# Patient Record
Sex: Female | Born: 1945 | Race: Black or African American | Hispanic: No | Smoking: Former smoker
Health system: Southern US, Community
[De-identification: ages and names within clinical notes are randomized; demographics above are authoritative.]

## PROBLEM LIST (undated history)

## (undated) DIAGNOSIS — E039 Hypothyroidism, unspecified: Secondary | ICD-10-CM

## (undated) DIAGNOSIS — F419 Anxiety disorder, unspecified: Secondary | ICD-10-CM

## (undated) DIAGNOSIS — R011 Cardiac murmur, unspecified: Secondary | ICD-10-CM

## (undated) DIAGNOSIS — Z8679 Personal history of other diseases of the circulatory system: Secondary | ICD-10-CM

## (undated) DIAGNOSIS — D649 Anemia, unspecified: Secondary | ICD-10-CM

## (undated) DIAGNOSIS — Z860100 Personal history of colon polyps, unspecified: Secondary | ICD-10-CM

## (undated) DIAGNOSIS — R7303 Prediabetes: Secondary | ICD-10-CM

## (undated) DIAGNOSIS — I253 Aneurysm of heart: Secondary | ICD-10-CM

## (undated) DIAGNOSIS — M1990 Unspecified osteoarthritis, unspecified site: Secondary | ICD-10-CM

## (undated) DIAGNOSIS — J449 Chronic obstructive pulmonary disease, unspecified: Secondary | ICD-10-CM

## (undated) DIAGNOSIS — I2510 Atherosclerotic heart disease of native coronary artery without angina pectoris: Secondary | ICD-10-CM

## (undated) DIAGNOSIS — G4700 Insomnia, unspecified: Secondary | ICD-10-CM

## (undated) DIAGNOSIS — M5030 Other cervical disc degeneration, unspecified cervical region: Secondary | ICD-10-CM

## (undated) DIAGNOSIS — R519 Headache, unspecified: Secondary | ICD-10-CM

## (undated) DIAGNOSIS — E785 Hyperlipidemia, unspecified: Secondary | ICD-10-CM

## (undated) DIAGNOSIS — I10 Essential (primary) hypertension: Secondary | ICD-10-CM

## (undated) DIAGNOSIS — G9050 Complex regional pain syndrome I, unspecified: Secondary | ICD-10-CM

## (undated) DIAGNOSIS — M899 Disorder of bone, unspecified: Secondary | ICD-10-CM

## (undated) DIAGNOSIS — R51 Headache: Secondary | ICD-10-CM

## (undated) DIAGNOSIS — M949 Disorder of cartilage, unspecified: Secondary | ICD-10-CM

## (undated) DIAGNOSIS — K219 Gastro-esophageal reflux disease without esophagitis: Secondary | ICD-10-CM

## (undated) DIAGNOSIS — I739 Peripheral vascular disease, unspecified: Principal | ICD-10-CM

## (undated) HISTORY — DX: Gastro-esophageal reflux disease without esophagitis: K21.9

## (undated) HISTORY — DX: Disorder of bone, unspecified: M89.9

## (undated) HISTORY — DX: Peripheral vascular disease, unspecified: I73.9

## (undated) HISTORY — DX: Cardiac murmur, unspecified: R01.1

## (undated) HISTORY — DX: Anxiety disorder, unspecified: F41.9

## (undated) HISTORY — DX: Other cervical disc degeneration, unspecified cervical region: M50.30

## (undated) HISTORY — DX: Unspecified osteoarthritis, unspecified site: M19.90

## (undated) HISTORY — DX: Hyperlipidemia, unspecified: E78.5

## (undated) HISTORY — DX: Disorder of bone, unspecified: M94.9

## (undated) HISTORY — PX: TONSILLECTOMY: SUR1361

## (undated) HISTORY — DX: Atherosclerotic heart disease of native coronary artery without angina pectoris: I25.10

## (undated) HISTORY — DX: Personal history of colon polyps, unspecified: Z86.0100

## (undated) HISTORY — DX: Hypothyroidism, unspecified: E03.9

## (undated) HISTORY — DX: Personal history of colonic polyps: Z86.010

## (undated) HISTORY — DX: Complex regional pain syndrome I, unspecified: G90.50

## (undated) HISTORY — DX: Essential (primary) hypertension: I10

## (undated) HISTORY — DX: Anemia, unspecified: D64.9

## (undated) HISTORY — DX: Personal history of other diseases of the circulatory system: Z86.79

## (undated) HISTORY — DX: Insomnia, unspecified: G47.00

## (undated) HISTORY — DX: Chronic obstructive pulmonary disease, unspecified: J44.9

## (undated) HISTORY — PX: JOINT REPLACEMENT: SHX530

---

## 1975-08-24 HISTORY — PX: ABDOMINAL HYSTERECTOMY: SHX81

## 1976-08-23 HISTORY — PX: DILATION AND CURETTAGE OF UTERUS: SHX78

## 1993-08-23 HISTORY — PX: THYROIDECTOMY: SHX17

## 1998-01-21 ENCOUNTER — Ambulatory Visit (HOSPITAL_COMMUNITY): Admission: RE | Admit: 1998-01-21 | Discharge: 1998-01-21 | Payer: Self-pay | Admitting: Internal Medicine

## 1998-10-30 ENCOUNTER — Encounter: Payer: Self-pay | Admitting: Emergency Medicine

## 1998-10-30 ENCOUNTER — Emergency Department (HOSPITAL_COMMUNITY): Admission: EM | Admit: 1998-10-30 | Discharge: 1998-10-30 | Payer: Self-pay | Admitting: Emergency Medicine

## 1998-12-25 ENCOUNTER — Encounter: Payer: Self-pay | Admitting: Emergency Medicine

## 1998-12-25 ENCOUNTER — Emergency Department (HOSPITAL_COMMUNITY): Admission: EM | Admit: 1998-12-25 | Discharge: 1998-12-25 | Payer: Self-pay | Admitting: Emergency Medicine

## 1999-02-12 ENCOUNTER — Ambulatory Visit (HOSPITAL_COMMUNITY): Admission: RE | Admit: 1999-02-12 | Discharge: 1999-02-12 | Payer: Self-pay | Admitting: Internal Medicine

## 2000-02-17 ENCOUNTER — Encounter: Payer: Self-pay | Admitting: Internal Medicine

## 2000-02-17 ENCOUNTER — Ambulatory Visit (HOSPITAL_COMMUNITY): Admission: RE | Admit: 2000-02-17 | Discharge: 2000-02-17 | Payer: Self-pay | Admitting: Internal Medicine

## 2000-04-10 ENCOUNTER — Encounter: Payer: Self-pay | Admitting: Emergency Medicine

## 2000-04-10 ENCOUNTER — Emergency Department (HOSPITAL_COMMUNITY): Admission: EM | Admit: 2000-04-10 | Discharge: 2000-04-10 | Payer: Self-pay | Admitting: Emergency Medicine

## 2000-11-10 ENCOUNTER — Encounter: Payer: Self-pay | Admitting: Endocrinology

## 2000-11-10 ENCOUNTER — Ambulatory Visit (HOSPITAL_COMMUNITY): Admission: RE | Admit: 2000-11-10 | Discharge: 2000-11-10 | Payer: Self-pay | Admitting: Endocrinology

## 2000-12-01 ENCOUNTER — Encounter (INDEPENDENT_AMBULATORY_CARE_PROVIDER_SITE_OTHER): Payer: Self-pay | Admitting: Specialist

## 2000-12-02 ENCOUNTER — Inpatient Hospital Stay (HOSPITAL_COMMUNITY): Admission: EM | Admit: 2000-12-02 | Discharge: 2000-12-03 | Payer: Self-pay | Admitting: Surgery

## 2001-01-01 ENCOUNTER — Encounter: Payer: Self-pay | Admitting: Emergency Medicine

## 2001-01-01 ENCOUNTER — Emergency Department (HOSPITAL_COMMUNITY): Admission: EM | Admit: 2001-01-01 | Discharge: 2001-01-01 | Payer: Self-pay | Admitting: Emergency Medicine

## 2002-01-24 ENCOUNTER — Encounter: Payer: Self-pay | Admitting: Family Medicine

## 2002-01-24 ENCOUNTER — Encounter: Admission: RE | Admit: 2002-01-24 | Discharge: 2002-01-24 | Payer: Self-pay | Admitting: Family Medicine

## 2002-07-04 ENCOUNTER — Encounter (INDEPENDENT_AMBULATORY_CARE_PROVIDER_SITE_OTHER): Payer: Self-pay | Admitting: *Deleted

## 2002-07-04 ENCOUNTER — Ambulatory Visit (HOSPITAL_COMMUNITY): Admission: RE | Admit: 2002-07-04 | Discharge: 2002-07-04 | Payer: Self-pay | Admitting: Gastroenterology

## 2003-01-15 ENCOUNTER — Ambulatory Visit (HOSPITAL_COMMUNITY): Admission: RE | Admit: 2003-01-15 | Discharge: 2003-01-15 | Payer: Self-pay | Admitting: Emergency Medicine

## 2003-02-22 ENCOUNTER — Encounter: Payer: Self-pay | Admitting: Cardiology

## 2003-08-24 HISTORY — PX: CARPAL TUNNEL RELEASE: SHX101

## 2004-01-15 ENCOUNTER — Emergency Department (HOSPITAL_COMMUNITY): Admission: EM | Admit: 2004-01-15 | Discharge: 2004-01-15 | Payer: Self-pay | Admitting: Emergency Medicine

## 2004-02-03 ENCOUNTER — Emergency Department (HOSPITAL_COMMUNITY): Admission: EM | Admit: 2004-02-03 | Discharge: 2004-02-03 | Payer: Self-pay | Admitting: Emergency Medicine

## 2004-02-12 ENCOUNTER — Emergency Department (HOSPITAL_COMMUNITY): Admission: EM | Admit: 2004-02-12 | Discharge: 2004-02-12 | Payer: Self-pay | Admitting: Emergency Medicine

## 2004-03-16 ENCOUNTER — Emergency Department (HOSPITAL_COMMUNITY): Admission: EM | Admit: 2004-03-16 | Discharge: 2004-03-16 | Payer: Self-pay | Admitting: Emergency Medicine

## 2004-06-19 ENCOUNTER — Ambulatory Visit (HOSPITAL_COMMUNITY): Admission: RE | Admit: 2004-06-19 | Discharge: 2004-06-19 | Payer: Self-pay | Admitting: Internal Medicine

## 2004-12-29 ENCOUNTER — Ambulatory Visit: Payer: Self-pay | Admitting: Internal Medicine

## 2005-01-19 ENCOUNTER — Ambulatory Visit: Payer: Self-pay

## 2005-06-11 ENCOUNTER — Ambulatory Visit: Payer: Self-pay | Admitting: Internal Medicine

## 2005-09-30 ENCOUNTER — Ambulatory Visit: Payer: Self-pay | Admitting: Internal Medicine

## 2005-10-08 ENCOUNTER — Ambulatory Visit: Payer: Self-pay | Admitting: Internal Medicine

## 2005-12-20 ENCOUNTER — Emergency Department (HOSPITAL_COMMUNITY): Admission: EM | Admit: 2005-12-20 | Discharge: 2005-12-20 | Payer: Self-pay | Admitting: Emergency Medicine

## 2007-02-18 ENCOUNTER — Emergency Department (HOSPITAL_COMMUNITY): Admission: EM | Admit: 2007-02-18 | Discharge: 2007-02-18 | Payer: Self-pay | Admitting: Emergency Medicine

## 2007-03-22 ENCOUNTER — Encounter: Payer: Self-pay | Admitting: Cardiology

## 2007-04-14 ENCOUNTER — Encounter: Payer: Self-pay | Admitting: Cardiology

## 2007-07-16 ENCOUNTER — Emergency Department (HOSPITAL_COMMUNITY): Admission: EM | Admit: 2007-07-16 | Discharge: 2007-07-16 | Payer: Self-pay | Admitting: Emergency Medicine

## 2007-09-04 ENCOUNTER — Ambulatory Visit: Payer: Self-pay | Admitting: Internal Medicine

## 2007-09-04 LAB — CONVERTED CEMR LAB
ALT: 12 units/L (ref 0–35)
AST: 17 units/L (ref 0–37)
Albumin: 3.3 g/dL — ABNORMAL LOW (ref 3.5–5.2)
Alkaline Phosphatase: 63 units/L (ref 39–117)
BUN: 13 mg/dL (ref 6–23)
Basophils Absolute: 0 10*3/uL (ref 0.0–0.1)
Basophils Relative: 0.3 % (ref 0.0–1.0)
Bilirubin Urine: NEGATIVE
Bilirubin, Direct: 0.1 mg/dL (ref 0.0–0.3)
CO2: 32 meq/L (ref 19–32)
Calcium: 8.7 mg/dL (ref 8.4–10.5)
Chloride: 105 meq/L (ref 96–112)
Cholesterol: 186 mg/dL (ref 0–200)
Creatinine, Ser: 0.8 mg/dL (ref 0.4–1.2)
Crystals: NEGATIVE
Eosinophils Absolute: 0.2 10*3/uL (ref 0.0–0.6)
Eosinophils Relative: 3.9 % (ref 0.0–5.0)
GFR calc Af Amer: 93 mL/min
GFR calc non Af Amer: 77 mL/min
Glucose, Bld: 99 mg/dL (ref 70–99)
HCT: 38.5 % (ref 36.0–46.0)
HDL: 49.1 mg/dL (ref >39.0)
Hemoglobin: 13.4 g/dL (ref 12.0–15.0)
Ketones, ur: NEGATIVE mg/dL
LDL Cholesterol: 121 mg/dL — ABNORMAL HIGH (ref 0–99)
Lymphocytes Relative: 28.6 % (ref 12.0–46.0)
MCHC: 34.8 g/dL (ref 30.0–36.0)
MCV: 90.6 fL (ref 78.0–100.0)
Monocytes Absolute: 0.3 10*3/uL (ref 0.2–0.7)
Monocytes Relative: 5.7 % (ref 3.0–11.0)
Mucus, UA: NEGATIVE
Neutro Abs: 3.5 10*3/uL (ref 1.4–7.7)
Neutrophils Relative %: 61.5 % (ref 43.0–77.0)
Nitrite: NEGATIVE
Platelets: 227 10*3/uL (ref 150–400)
Potassium: 4.2 meq/L (ref 3.5–5.1)
RBC: 4.26 M/uL (ref 3.87–5.11)
RDW: 13.2 % (ref 11.5–14.6)
Sodium: 143 meq/L (ref 135–145)
Specific Gravity, Urine: 1.01 (ref 1.000–1.03)
TSH: 8.57 microintl units/mL — ABNORMAL HIGH (ref 0.35–5.50)
Total Bilirubin: 0.7 mg/dL (ref 0.3–1.2)
Total CHOL/HDL Ratio: 3.8
Total Protein: 6 g/dL (ref 6.0–8.3)
Triglycerides: 77 mg/dL (ref 0–149)
Urine Glucose: NEGATIVE mg/dL
Urobilinogen, UA: 0.2 (ref 0.0–1.0)
VLDL: 15 mg/dL (ref 0–40)
WBC: 5.6 10*3/uL (ref 4.5–10.5)
pH: 7.5 (ref 5.0–8.0)

## 2007-09-07 ENCOUNTER — Ambulatory Visit: Payer: Self-pay | Admitting: Internal Medicine

## 2007-09-07 DIAGNOSIS — M1711 Unilateral primary osteoarthritis, right knee: Secondary | ICD-10-CM | POA: Insufficient documentation

## 2007-09-07 DIAGNOSIS — E039 Hypothyroidism, unspecified: Secondary | ICD-10-CM | POA: Insufficient documentation

## 2007-09-07 DIAGNOSIS — I10 Essential (primary) hypertension: Secondary | ICD-10-CM | POA: Insufficient documentation

## 2007-09-07 DIAGNOSIS — J309 Allergic rhinitis, unspecified: Secondary | ICD-10-CM | POA: Insufficient documentation

## 2007-09-07 DIAGNOSIS — E785 Hyperlipidemia, unspecified: Secondary | ICD-10-CM | POA: Insufficient documentation

## 2007-09-07 DIAGNOSIS — K219 Gastro-esophageal reflux disease without esophagitis: Secondary | ICD-10-CM | POA: Insufficient documentation

## 2007-09-07 DIAGNOSIS — M8580 Other specified disorders of bone density and structure, unspecified site: Secondary | ICD-10-CM | POA: Insufficient documentation

## 2007-09-07 DIAGNOSIS — Z860100 Personal history of colon polyps, unspecified: Secondary | ICD-10-CM | POA: Insufficient documentation

## 2007-09-15 ENCOUNTER — Ambulatory Visit: Payer: Self-pay | Admitting: Family Medicine

## 2007-09-15 ENCOUNTER — Encounter: Payer: Self-pay | Admitting: Internal Medicine

## 2007-09-22 ENCOUNTER — Encounter: Payer: Self-pay | Admitting: Internal Medicine

## 2007-09-29 ENCOUNTER — Ambulatory Visit: Payer: Self-pay | Admitting: Gastroenterology

## 2007-10-10 ENCOUNTER — Encounter: Payer: Self-pay | Admitting: Internal Medicine

## 2007-10-10 ENCOUNTER — Ambulatory Visit: Payer: Self-pay | Admitting: Gastroenterology

## 2007-10-10 LAB — HM COLONOSCOPY

## 2007-11-06 ENCOUNTER — Ambulatory Visit: Payer: Self-pay | Admitting: Internal Medicine

## 2007-11-06 DIAGNOSIS — R079 Chest pain, unspecified: Secondary | ICD-10-CM | POA: Insufficient documentation

## 2007-11-06 DIAGNOSIS — R1011 Right upper quadrant pain: Secondary | ICD-10-CM | POA: Insufficient documentation

## 2007-11-16 ENCOUNTER — Encounter: Admission: RE | Admit: 2007-11-16 | Discharge: 2007-11-16 | Payer: Self-pay | Admitting: Internal Medicine

## 2007-12-12 ENCOUNTER — Ambulatory Visit: Payer: Self-pay | Admitting: Internal Medicine

## 2008-05-23 ENCOUNTER — Ambulatory Visit: Payer: Self-pay | Admitting: Internal Medicine

## 2008-05-23 DIAGNOSIS — M79609 Pain in unspecified limb: Secondary | ICD-10-CM | POA: Insufficient documentation

## 2008-06-12 ENCOUNTER — Encounter: Payer: Self-pay | Admitting: Cardiology

## 2008-06-25 ENCOUNTER — Encounter: Payer: Self-pay | Admitting: Internal Medicine

## 2008-06-26 ENCOUNTER — Ambulatory Visit: Payer: Self-pay | Admitting: Internal Medicine

## 2008-06-26 DIAGNOSIS — J069 Acute upper respiratory infection, unspecified: Secondary | ICD-10-CM | POA: Insufficient documentation

## 2008-06-26 DIAGNOSIS — R42 Dizziness and giddiness: Secondary | ICD-10-CM | POA: Insufficient documentation

## 2008-06-26 LAB — CONVERTED CEMR LAB
BUN: 13 mg/dL (ref 6–23)
Basophils Absolute: 0 10*3/uL (ref 0.0–0.1)
Basophils Relative: 0.6 % (ref 0.0–3.0)
CO2: 31 meq/L (ref 19–32)
Calcium: 9.2 mg/dL (ref 8.4–10.5)
Chloride: 108 meq/L (ref 96–112)
Creatinine, Ser: 0.5 mg/dL (ref 0.4–1.2)
Eosinophils Absolute: 0.3 10*3/uL (ref 0.0–0.7)
Eosinophils Relative: 4.4 % (ref 0.0–5.0)
GFR calc Af Amer: 160 mL/min
GFR calc non Af Amer: 132 mL/min
Glucose, Bld: 102 mg/dL — ABNORMAL HIGH (ref 70–99)
HCT: 36.7 % (ref 36.0–46.0)
Hemoglobin: 12.6 g/dL (ref 12.0–15.0)
Lymphocytes Relative: 33.5 % (ref 12.0–46.0)
MCHC: 34.3 g/dL (ref 30.0–36.0)
MCV: 92.2 fL (ref 78.0–100.0)
Monocytes Absolute: 0.3 10*3/uL (ref 0.1–1.0)
Monocytes Relative: 5.3 % (ref 3.0–12.0)
Neutro Abs: 3.7 10*3/uL (ref 1.4–7.7)
Neutrophils Relative %: 56.2 % (ref 43.0–77.0)
Platelets: 184 10*3/uL (ref 150–400)
Potassium: 4.7 meq/L (ref 3.5–5.1)
RBC: 3.98 M/uL (ref 3.87–5.11)
RDW: 13.9 % (ref 11.5–14.6)
Sodium: 145 meq/L (ref 135–145)
WBC: 6.5 10*3/uL (ref 4.5–10.5)

## 2008-06-27 ENCOUNTER — Telehealth (INDEPENDENT_AMBULATORY_CARE_PROVIDER_SITE_OTHER): Payer: Self-pay | Admitting: *Deleted

## 2008-08-07 ENCOUNTER — Encounter: Payer: Self-pay | Admitting: Cardiology

## 2008-08-07 ENCOUNTER — Encounter: Payer: Self-pay | Admitting: Internal Medicine

## 2008-08-21 ENCOUNTER — Ambulatory Visit: Payer: Self-pay | Admitting: Internal Medicine

## 2008-09-02 ENCOUNTER — Encounter: Payer: Self-pay | Admitting: Internal Medicine

## 2008-09-03 ENCOUNTER — Encounter: Payer: Self-pay | Admitting: Internal Medicine

## 2008-09-03 ENCOUNTER — Telehealth (INDEPENDENT_AMBULATORY_CARE_PROVIDER_SITE_OTHER): Payer: Self-pay | Admitting: *Deleted

## 2008-09-04 ENCOUNTER — Ambulatory Visit: Payer: Self-pay | Admitting: Internal Medicine

## 2008-09-04 LAB — CONVERTED CEMR LAB
ALT: 16 units/L (ref 0–35)
AST: 20 units/L (ref 0–37)
Albumin: 3.5 g/dL (ref 3.5–5.2)
Alkaline Phosphatase: 59 units/L (ref 39–117)
BUN: 11 mg/dL (ref 6–23)
Basophils Absolute: 0 10*3/uL (ref 0.0–0.1)
Basophils Relative: 0.6 % (ref 0.0–3.0)
Bilirubin Urine: NEGATIVE
Bilirubin, Direct: 0.1 mg/dL (ref 0.0–0.3)
CO2: 32 meq/L (ref 19–32)
Calcium: 9.3 mg/dL (ref 8.4–10.5)
Chloride: 102 meq/L (ref 96–112)
Cholesterol: 149 mg/dL (ref 0–200)
Creatinine, Ser: 0.7 mg/dL (ref 0.4–1.2)
Crystals: NEGATIVE
Eosinophils Absolute: 0.2 10*3/uL (ref 0.0–0.7)
Eosinophils Relative: 3.5 % (ref 0.0–5.0)
GFR calc Af Amer: 108 mL/min
GFR calc non Af Amer: 90 mL/min
Glucose, Bld: 95 mg/dL (ref 70–99)
HCT: 42 % (ref 36.0–46.0)
HDL: 56.8 mg/dL (ref >39.0)
Hemoglobin: 14.5 g/dL (ref 12.0–15.0)
LDL Cholesterol: 78 mg/dL (ref 0–99)
Leukocytes, UA: NEGATIVE
Lymphocytes Relative: 28.4 % (ref 12.0–46.0)
MCHC: 34.5 g/dL (ref 30.0–36.0)
MCV: 93.9 fL (ref 78.0–100.0)
Monocytes Absolute: 0.4 10*3/uL (ref 0.1–1.0)
Monocytes Relative: 6.2 % (ref 3.0–12.0)
Mucus, UA: NEGATIVE
Neutro Abs: 3.7 10*3/uL (ref 1.4–7.7)
Neutrophils Relative %: 61.3 % (ref 43.0–77.0)
Nitrite: NEGATIVE
Platelets: 251 10*3/uL (ref 150–400)
Potassium: 3 meq/L — ABNORMAL LOW (ref 3.5–5.1)
RBC: 4.47 M/uL (ref 3.87–5.11)
RDW: 13.3 % (ref 11.5–14.6)
Sodium: 143 meq/L (ref 135–145)
Specific Gravity, Urine: 1.015 (ref 1.000–1.03)
TSH: 2.23 microintl units/mL (ref 0.35–5.50)
Total Bilirubin: 0.6 mg/dL (ref 0.3–1.2)
Total CHOL/HDL Ratio: 2.6
Total Protein, Urine: 30 mg/dL — AB
Total Protein: 6.5 g/dL (ref 6.0–8.3)
Triglycerides: 73 mg/dL (ref 0–149)
Urine Glucose: NEGATIVE mg/dL
Urobilinogen, UA: 2 (ref 0.0–1.0)
VLDL: 15 mg/dL (ref 0–40)
WBC: 6 10*3/uL (ref 4.5–10.5)
pH: 6.5 (ref 5.0–8.0)

## 2008-09-05 ENCOUNTER — Encounter: Payer: Self-pay | Admitting: Internal Medicine

## 2008-09-12 ENCOUNTER — Ambulatory Visit: Payer: Self-pay | Admitting: Internal Medicine

## 2008-09-12 DIAGNOSIS — E876 Hypokalemia: Secondary | ICD-10-CM | POA: Insufficient documentation

## 2008-10-04 ENCOUNTER — Encounter: Payer: Self-pay | Admitting: Internal Medicine

## 2008-10-09 ENCOUNTER — Encounter: Admission: RE | Admit: 2008-10-09 | Discharge: 2008-10-09 | Payer: Self-pay | Admitting: Orthopaedic Surgery

## 2008-10-23 ENCOUNTER — Encounter: Payer: Self-pay | Admitting: Internal Medicine

## 2008-10-25 ENCOUNTER — Encounter: Admission: RE | Admit: 2008-10-25 | Discharge: 2008-10-25 | Payer: Self-pay | Admitting: Orthopaedic Surgery

## 2008-12-20 ENCOUNTER — Ambulatory Visit: Payer: Self-pay | Admitting: Internal Medicine

## 2009-02-11 ENCOUNTER — Encounter: Payer: Self-pay | Admitting: Cardiology

## 2009-02-11 ENCOUNTER — Encounter: Payer: Self-pay | Admitting: Internal Medicine

## 2009-02-21 ENCOUNTER — Inpatient Hospital Stay (HOSPITAL_COMMUNITY): Admission: EM | Admit: 2009-02-21 | Discharge: 2009-02-22 | Payer: Self-pay | Admitting: Emergency Medicine

## 2009-02-21 ENCOUNTER — Ambulatory Visit: Payer: Self-pay | Admitting: Internal Medicine

## 2009-03-03 ENCOUNTER — Ambulatory Visit: Payer: Self-pay | Admitting: Internal Medicine

## 2009-03-03 LAB — CONVERTED CEMR LAB
BUN: 9 mg/dL (ref 6–23)
CO2: 32 meq/L (ref 19–32)
Calcium: 9.1 mg/dL (ref 8.4–10.5)
Chloride: 108 meq/L (ref 96–112)
Creatinine, Ser: 0.7 mg/dL (ref 0.4–1.2)
GFR calc non Af Amer: 108.09 mL/min (ref >60)
Glucose, Bld: 88 mg/dL (ref 70–99)
Potassium: 4.3 meq/L (ref 3.5–5.1)
Sodium: 148 meq/L — ABNORMAL HIGH (ref 135–145)

## 2009-03-13 ENCOUNTER — Encounter: Payer: Self-pay | Admitting: Internal Medicine

## 2009-03-20 ENCOUNTER — Ambulatory Visit: Payer: Self-pay | Admitting: Internal Medicine

## 2009-03-20 DIAGNOSIS — M5030 Other cervical disc degeneration, unspecified cervical region: Secondary | ICD-10-CM | POA: Insufficient documentation

## 2009-03-21 ENCOUNTER — Encounter: Payer: Self-pay | Admitting: Internal Medicine

## 2009-03-21 ENCOUNTER — Ambulatory Visit: Payer: Self-pay

## 2009-04-07 ENCOUNTER — Ambulatory Visit: Payer: Self-pay | Admitting: Internal Medicine

## 2009-06-05 ENCOUNTER — Ambulatory Visit: Payer: Self-pay | Admitting: Internal Medicine

## 2009-06-17 ENCOUNTER — Ambulatory Visit: Payer: Self-pay | Admitting: Internal Medicine

## 2009-06-17 DIAGNOSIS — R011 Cardiac murmur, unspecified: Secondary | ICD-10-CM | POA: Insufficient documentation

## 2009-06-17 LAB — CONVERTED CEMR LAB
ALT: 18 units/L (ref 0–35)
AST: 21 units/L (ref 0–37)
Albumin: 3.7 g/dL (ref 3.5–5.2)
Alkaline Phosphatase: 61 units/L (ref 39–117)
BUN: 14 mg/dL (ref 6–23)
Basophils Absolute: 0.1 10*3/uL (ref 0.0–0.1)
Basophils Relative: 1.4 % (ref 0.0–3.0)
Bilirubin Urine: NEGATIVE
Bilirubin, Direct: 0.1 mg/dL (ref 0.0–0.3)
CO2: 31 meq/L (ref 19–32)
Calcium: 9 mg/dL (ref 8.4–10.5)
Chloride: 101 meq/L (ref 96–112)
Cholesterol: 152 mg/dL (ref 0–200)
Creatinine, Ser: 0.7 mg/dL (ref 0.4–1.2)
Eosinophils Absolute: 0.2 10*3/uL (ref 0.0–0.7)
Eosinophils Relative: 3.5 % (ref 0.0–5.0)
GFR calc non Af Amer: 107.99 mL/min (ref >60)
Glucose, Bld: 89 mg/dL (ref 70–99)
HCT: 41.6 % (ref 36.0–46.0)
HDL: 61.5 mg/dL (ref >39.00)
Hemoglobin: 14.3 g/dL (ref 12.0–15.0)
Ketones, ur: NEGATIVE mg/dL
LDL Cholesterol: 75 mg/dL (ref 0–99)
Leukocytes, UA: NEGATIVE
Lymphocytes Relative: 42 % (ref 12.0–46.0)
Lymphs Abs: 2.2 10*3/uL (ref 0.7–4.0)
MCHC: 34.5 g/dL (ref 30.0–36.0)
MCV: 95.9 fL (ref 78.0–100.0)
Monocytes Absolute: 0.3 10*3/uL (ref 0.1–1.0)
Monocytes Relative: 5.8 % (ref 3.0–12.0)
Neutro Abs: 2.5 10*3/uL (ref 1.4–7.7)
Neutrophils Relative %: 47.3 % (ref 43.0–77.0)
Nitrite: POSITIVE
Platelets: 202 10*3/uL (ref 150.0–400.0)
Potassium: 3.9 meq/L (ref 3.5–5.1)
RBC: 4.33 M/uL (ref 3.87–5.11)
RDW: 13.4 % (ref 11.5–14.6)
Sodium: 142 meq/L (ref 135–145)
Specific Gravity, Urine: 1.025 (ref 1.000–1.030)
TSH: 0.64 microintl units/mL (ref 0.35–5.50)
Total Bilirubin: 0.7 mg/dL (ref 0.3–1.2)
Total CHOL/HDL Ratio: 2
Total Protein: 7 g/dL (ref 6.0–8.3)
Triglycerides: 76 mg/dL (ref 0.0–149.0)
Urine Glucose: NEGATIVE mg/dL
Urobilinogen, UA: 1 (ref 0.0–1.0)
VLDL: 15.2 mg/dL (ref 0.0–40.0)
WBC: 5.3 10*3/uL (ref 4.5–10.5)
pH: 6 (ref 5.0–8.0)

## 2009-07-21 ENCOUNTER — Ambulatory Visit: Payer: Self-pay | Admitting: Cardiology

## 2009-07-21 DIAGNOSIS — F17200 Nicotine dependence, unspecified, uncomplicated: Secondary | ICD-10-CM | POA: Insufficient documentation

## 2009-08-06 ENCOUNTER — Telehealth (INDEPENDENT_AMBULATORY_CARE_PROVIDER_SITE_OTHER): Payer: Self-pay | Admitting: *Deleted

## 2009-08-08 ENCOUNTER — Telehealth (INDEPENDENT_AMBULATORY_CARE_PROVIDER_SITE_OTHER): Payer: Self-pay | Admitting: *Deleted

## 2009-08-23 HISTORY — PX: CARDIAC CATHETERIZATION: SHX172

## 2009-09-02 LAB — HM MAMMOGRAPHY: HM Mammogram: NORMAL

## 2009-09-10 ENCOUNTER — Encounter: Payer: Self-pay | Admitting: Cardiology

## 2009-09-12 ENCOUNTER — Encounter: Payer: Self-pay | Admitting: Internal Medicine

## 2009-09-26 ENCOUNTER — Encounter: Payer: Self-pay | Admitting: Internal Medicine

## 2009-10-14 ENCOUNTER — Telehealth: Payer: Self-pay | Admitting: Internal Medicine

## 2009-10-21 HISTORY — PX: CORONARY ARTERY BYPASS GRAFT: SHX141

## 2009-11-05 ENCOUNTER — Encounter: Admission: RE | Admit: 2009-11-05 | Discharge: 2009-11-05 | Payer: Self-pay | Admitting: Family Medicine

## 2009-11-10 ENCOUNTER — Telehealth: Payer: Self-pay | Admitting: Internal Medicine

## 2009-11-13 ENCOUNTER — Ambulatory Visit: Payer: Self-pay | Admitting: Internal Medicine

## 2009-11-17 ENCOUNTER — Telehealth: Payer: Self-pay | Admitting: Internal Medicine

## 2009-12-10 ENCOUNTER — Ambulatory Visit: Payer: Self-pay | Admitting: Internal Medicine

## 2009-12-10 DIAGNOSIS — R21 Rash and other nonspecific skin eruption: Secondary | ICD-10-CM | POA: Insufficient documentation

## 2010-02-19 ENCOUNTER — Ambulatory Visit: Payer: Self-pay | Admitting: Internal Medicine

## 2010-02-19 DIAGNOSIS — K5900 Constipation, unspecified: Secondary | ICD-10-CM | POA: Insufficient documentation

## 2010-06-11 ENCOUNTER — Ambulatory Visit: Payer: Self-pay | Admitting: Internal Medicine

## 2010-06-20 ENCOUNTER — Emergency Department (HOSPITAL_COMMUNITY)
Admission: EM | Admit: 2010-06-20 | Discharge: 2010-06-20 | Payer: Self-pay | Source: Home / Self Care | Admitting: Family Medicine

## 2010-07-09 ENCOUNTER — Emergency Department (HOSPITAL_COMMUNITY)
Admission: EM | Admit: 2010-07-09 | Discharge: 2010-07-09 | Payer: Self-pay | Source: Home / Self Care | Admitting: Emergency Medicine

## 2010-07-23 ENCOUNTER — Ambulatory Visit: Payer: Self-pay | Admitting: Internal Medicine

## 2010-07-23 LAB — CONVERTED CEMR LAB
ALT: 13 units/L (ref 0–35)
AST: 16 units/L (ref 0–37)
Albumin: 3.4 g/dL — ABNORMAL LOW (ref 3.5–5.2)
Alkaline Phosphatase: 83 units/L (ref 39–117)
BUN: 13 mg/dL (ref 6–23)
Basophils Absolute: 0 10*3/uL (ref 0.0–0.1)
Basophils Relative: 0.6 % (ref 0.0–3.0)
Bilirubin Urine: NEGATIVE
Bilirubin, Direct: 0.1 mg/dL (ref 0.0–0.3)
CO2: 30 meq/L (ref 19–32)
Calcium: 9.1 mg/dL (ref 8.4–10.5)
Chloride: 104 meq/L (ref 96–112)
Cholesterol: 155 mg/dL (ref 0–200)
Creatinine, Ser: 0.7 mg/dL (ref 0.4–1.2)
Eosinophils Absolute: 0.3 10*3/uL (ref 0.0–0.7)
Eosinophils Relative: 5.4 % — ABNORMAL HIGH (ref 0.0–5.0)
GFR calc non Af Amer: 111.28 mL/min (ref >60)
Glucose, Bld: 91 mg/dL (ref 70–99)
HCT: 43.7 % (ref 36.0–46.0)
HDL: 47.7 mg/dL (ref >39.00)
Hemoglobin: 14.8 g/dL (ref 12.0–15.0)
Ketones, ur: NEGATIVE mg/dL
LDL Cholesterol: 90 mg/dL (ref 0–99)
Leukocytes, UA: NEGATIVE
Lymphocytes Relative: 30.7 % (ref 12.0–46.0)
Lymphs Abs: 1.9 10*3/uL (ref 0.7–4.0)
MCHC: 34 g/dL (ref 30.0–36.0)
MCV: 90.6 fL (ref 78.0–100.0)
Monocytes Absolute: 0.4 10*3/uL (ref 0.1–1.0)
Monocytes Relative: 5.7 % (ref 3.0–12.0)
Neutro Abs: 3.5 10*3/uL (ref 1.4–7.7)
Neutrophils Relative %: 57.6 % (ref 43.0–77.0)
Nitrite: NEGATIVE
Platelets: 217 10*3/uL (ref 150.0–400.0)
Potassium: 4.3 meq/L (ref 3.5–5.1)
RBC: 4.82 M/uL (ref 3.87–5.11)
RDW: 15 % — ABNORMAL HIGH (ref 11.5–14.6)
Sodium: 142 meq/L (ref 135–145)
Specific Gravity, Urine: 1.01 (ref 1.000–1.030)
TSH: 0.04 microintl units/mL — ABNORMAL LOW (ref 0.35–5.50)
Total Bilirubin: 0.5 mg/dL (ref 0.3–1.2)
Total CHOL/HDL Ratio: 3
Total Protein: 6.4 g/dL (ref 6.0–8.3)
Triglycerides: 89 mg/dL (ref 0.0–149.0)
Urine Glucose: NEGATIVE mg/dL
Urobilinogen, UA: 0.2 (ref 0.0–1.0)
VLDL: 17.8 mg/dL (ref 0.0–40.0)
WBC: 6.1 10*3/uL (ref 4.5–10.5)
pH: 7 (ref 5.0–8.0)

## 2010-07-27 ENCOUNTER — Ambulatory Visit: Payer: Self-pay | Admitting: Internal Medicine

## 2010-07-27 ENCOUNTER — Encounter: Payer: Self-pay | Admitting: Internal Medicine

## 2010-07-27 DIAGNOSIS — R350 Frequency of micturition: Secondary | ICD-10-CM | POA: Insufficient documentation

## 2010-08-04 ENCOUNTER — Ambulatory Visit: Payer: Self-pay | Admitting: Cardiology

## 2010-09-08 ENCOUNTER — Encounter: Payer: Self-pay | Admitting: Cardiology

## 2010-09-08 ENCOUNTER — Ambulatory Visit: Admit: 2010-09-08 | Payer: Self-pay | Admitting: Cardiology

## 2010-09-08 DIAGNOSIS — I2510 Atherosclerotic heart disease of native coronary artery without angina pectoris: Secondary | ICD-10-CM | POA: Insufficient documentation

## 2010-09-13 ENCOUNTER — Encounter: Payer: Self-pay | Admitting: Neurosurgery

## 2010-09-22 NOTE — Assessment & Plan Note (Signed)
Summary: FU / $50 /NWS   Vital Signs:  Patient profile:   65 year old female Height:      62.5 inches Weight:      113.50 pounds BMI:     20.50 O2 Sat:      95 % Temp:     98.2 degrees F oral Pulse rate:   87 / minute BP sitting:   126 / 80  (left arm) Cuff size:   regular  Vitals Entered By: Windell Norfolk (April 07, 2009 9:09 AM) CC: F/U PN NEUROLOGIST APPT   CC:  F/U PN NEUROLOGIST APPT.  History of Present Illness: large bruise to the right arm is resolved; current meds seem ok for pain but she is trying to minimize what she has to take; has declined to see for the second opinion on re-consideration  after last visit; still with ongoing RUE pain and debility no significant change from last visit with respect to pain, numbness, weakness or swelling.  Pt denies CP, sob, doe, wheezing, orthopnea, pnd, worsening LE edema, palps, dizziness or syncope   Pt denies new neuro symptoms such as headache, facial or extremity weakness   Problems Prior to Update: 1)  Disc Disease, Cervical  (ICD-722.4) 2)  Arm Pain, Right  (ICD-729.5) 3)  Arm Pain, Right  (ICD-729.5) 4)  Preventive Health Care  (ICD-V70.0) 5)  Hypokalemia  (ICD-276.8) 6)  Uri  (ICD-465.9) 7)  Preventive Health Care  (ICD-V70.0) 8)  Arm Pain, Right  (ICD-729.5) 9)  Uri  (ICD-465.9) 10)  Uri  (ICD-465.9) 11)  Dizziness  (ICD-780.4) 12)  Hand Pain, Right  (ICD-729.5) 13)  Need Proph Vaccination&inoculat Oth Viral Dz  (ICD-V04.89) 14)  Abdominal Pain Right Upper Quadrant  (ICD-789.01) 15)  Chest Pain  (ICD-786.50) 16)  Osteoarthritis  (ICD-715.90) 17)  Hyperlipidemia  (ICD-272.4) 18)  Preventive Health Care  (ICD-V70.0) 19)  Osteopenia  (ICD-733.90) 20)  Gerd  (ICD-530.81) 21)  Hypothyroidism  (ICD-244.9) 22)  Allergic Rhinitis  (ICD-477.9) 23)  Hypertension  (ICD-401.9) 24)  Colonic Polyps, Hx of  (ICD-V12.72) 25)  Routine General Medical Exam@health  Care Facl  (ICD-V70.0)  Medications Prior to Update:  1)  Levothyroxine Sodium 100 Mcg Tabs (Levothyroxine Sodium) .Marland Kitchen.. 1 By Mouth Once Daily 2)  Amlodipine Besylate 5 Mg Tabs (Amlodipine Besylate) .Marland Kitchen.. 1 By Mouth Once Daily 3)  Ecotrin Low Strength 81 Mg  Tbec (Aspirin) .Marland Kitchen.. 1 By Mouth Qd 4)  Alendronate Sodium 70 Mg Tabs (Alendronate Sodium) .Marland Kitchen.. 1 By Mouth Q Wk 5)  Vitamin D 1000 Unit  Caps (Cholecalciferol) .Marland Kitchen.. 1 By Mouth Qd 6)  Hydrochlorothiazide 25 Mg Tabs (Hydrochlorothiazide) .Marland Kitchen.. 1 By Mouth Once Daily 7)  Hydrocodone-Acetaminophen 5-325 Mg Tabs (Hydrocodone-Acetaminophen) .Marland Kitchen.. 1 - 2 By Mouth Q 6 Hrs As Needed  Current Medications (verified): 1)  Levothyroxine Sodium 100 Mcg Tabs (Levothyroxine Sodium) .Marland Kitchen.. 1 By Mouth Once Daily 2)  Amlodipine Besylate 5 Mg Tabs (Amlodipine Besylate) .Marland Kitchen.. 1 By Mouth Once Daily 3)  Ecotrin Low Strength 81 Mg  Tbec (Aspirin) .Marland Kitchen.. 1 By Mouth Qd 4)  Alendronate Sodium 70 Mg Tabs (Alendronate Sodium) .Marland Kitchen.. 1 By Mouth Q Wk 5)  Vitamin D 1000 Unit  Caps (Cholecalciferol) .Marland Kitchen.. 1 By Mouth Qd 6)  Hydrochlorothiazide 25 Mg Tabs (Hydrochlorothiazide) .Marland Kitchen.. 1 By Mouth Once Daily 7)  Hydrocodone-Acetaminophen 5-325 Mg Tabs (Hydrocodone-Acetaminophen) .Marland Kitchen.. 1 - 2 By Mouth Q 6 Hrs As Needed 8)  Gabapentin 300 Mg Caps (Gabapentin) .Marland Kitchen.. 1po Three Times A Day  Allergies (  verified): No Known Drug Allergies  Past History:  Past Medical History: Last updated: 03/20/2009 Colonic polyps, hx of Hypertension Allergic rhinitis DJD Hypothyroidism s/p tx for hyperthyrodism mult pulm nodules  - non-specific GERD ? hx of rheumatic fever Osteopenia Hyperlipidemia Osteoarthritis c-spine disc dz  Past Surgical History: Last updated: 09/07/2007 Hysterectomy Tonsillectomy s/p carpal tunnel and other right hand surgury  Social History: Last updated: 09/07/2007 Current Smoker Alcohol use-no Divorced 2 children work - [employer]  Risk Factors: Smoking Status: current (09/07/2007)  Review of Systems        all otherwise negative - 12 system review done   Physical Exam  General:  alert and well-developed.   Head:  normocephalic and atraumatic.   Eyes:  vision grossly intact, pupils equal, and pupils round.   Ears:  R ear normal and L ear normal.   Nose:  no external deformity and no nasal discharge.   Mouth:  no gingival abnormalities and pharynx pink and moist.   Neck:  supple and no masses.   Lungs:  normal respiratory effort and normal breath sounds.   Heart:  normal rate and regular rhythm.   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 RSD; will add gabapentin although she may not take as she seems wary of new meds even though we discussed how to take and potential side effects  Problem # 2:  HYPERTENSION (ICD-401.9)  Her updated medication list for this problem includes:    Amlodipine Besylate 5 Mg Tabs (Amlodipine besylate) .Marland Kitchen... 1 by mouth once daily    Hydrochlorothiazide 25 Mg Tabs (Hydrochlorothiazide) .Marland Kitchen... 1 by mouth once daily  BP today: 126/80 Prior BP: 124/68 (03/20/2009)  Labs Reviewed: K+: 4.3 (03/03/2009) Creat: : 0.7 (03/03/2009)   Chol: 149 (09/04/2008)   HDL: 56.8 (09/04/2008)   LDL: 78 (09/04/2008)   TG: 73 (09/04/2008) stable overall by hx and exam, ok to continue meds/tx as is   Complete Medication List: 1)  Levothyroxine Sodium 100 Mcg Tabs (Levothyroxine sodium) .Marland Kitchen.. 1 by mouth once daily 2)  Amlodipine Besylate 5 Mg Tabs (Amlodipine besylate) .Marland Kitchen.. 1 by mouth once daily 3)  Ecotrin Low Strength 81 Mg Tbec (Aspirin) .Marland Kitchen.. 1 by mouth qd 4)  Alendronate Sodium 70 Mg Tabs (Alendronate sodium) .Marland Kitchen.. 1 by mouth q wk  5)  Vitamin D 1000 Unit Caps (Cholecalciferol) .Marland Kitchen.. 1 by mouth qd 6)  Hydrochlorothiazide 25 Mg Tabs (Hydrochlorothiazide) .Marland Kitchen.. 1 by mouth once daily 7)  Hydrocodone-acetaminophen 5-325 Mg Tabs (Hydrocodone-acetaminophen) .Marland Kitchen.. 1 - 2 by mouth q 6 hrs as needed 8)  Gabapentin 300 Mg Caps (Gabapentin) .Marland Kitchen.. 1po three times a day  Patient Instructions: 1)  Please take all new medications as prescribed - the gabapentin is taken as one pill in the evening for 3 nights, then twice per day for 3 days, then three times per day after that 2)  Continue all previous medications as before this visit  3)  Please schedule a follow-up appointment in 6 months with CPX labs Prescriptions: GABAPENTIN 300 MG CAPS (GABAPENTIN) 1po three times a day  #90 x 11   Entered and Authorized by:   Corwin Levins MD   Signed by:  Corwin Levins MD on 04/07/2009   Method used:   Print then Give to Patient   RxID:   [id number]
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
## 2010-09-22 NOTE — Miscellaneous (Signed)
Summary: Bone Density  Clinical Lists Changes  Orders: Added new Test order of T-Lumbar Vertebral Assessment (77082) - Signed 

## 2010-09-22 NOTE — Letter (Signed)
Summary: [facility] Cath Report  [facility] Cath Report   Imported By: Roderic Ovens 10/02/2009 09:49:16  _____________________________________________________________________  External Attachment:    Type:   Image     Comment:   External Document
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
## 2010-09-22 NOTE — Assessment & Plan Note (Signed)
Summary: losing balance/$50/cd   Vital Signs:  Patient Profile:   65 Years Old Female Weight:      116.8 pounds O2 Sat:      97 % O2 treatment:    Room Air Temp:     98.5 degrees F oral Pulse rate:   79 / minute BP sitting:   130 / 82  (left arm) Cuff size:   regular  Vitals Entered By: Payton Spark CMA (June 26, 2008 9:25 AM)                  Chief Complaint:  losing balance.  History of Present Illness: stayed a week with her daughter  - while there devleoped balance problem it seems with lightheadedness, and blurred vision; did fall at one time to the rigt side with abrasion to right hand and contusion to the right shoulder; now back to [place] since last thur (about one wk ago); since hten has been mostly resting; but this am wihtmore exertion to come here had another episode of lightheadnes and dizziness and blurred vision; nover has passed out; no further falls, any CP, sob, orthopne,a pnd, or LE edema; no hx of stroke or known vascular disease; has had incidnetal cough, congestions with clear mucous and sinus congestion; ear fullness; saw cardiology - dr gamble  - tx iwth chantix started about one wk ago (after seeing him 2 wks ago); has had some off and on nonspecific headaches, no fever, chills, .    Updated Prior Medication List: LEVOXYL 88 MCG  TABS (LEVOTHYROXINE SODIUM) 1 by mouth qd NORVASC 5 MG  TABS (AMLODIPINE BESYLATE) 1 by mouth qd ECOTRIN LOW STRENGTH 81 MG  TBEC (ASPIRIN) 1 by mouth qd ACTONEL 35 MG  TABS (RISEDRONATE SODIUM) 1 by mouth q wk HYDROCHLOROTHIAZIDE 25 MG  TABS (HYDROCHLOROTHIAZIDE) 1 by mouth qd VITAMIN D 1000 UNIT  CAPS (CHOLECALCIFEROL) 1 by mouth qd OMEPRAZOLE 20 MG  CPDR (OMEPRAZOLE) 2 by mouth qd CETIRIZINE HCL 10 MG TABS (CETIRIZINE HCL) 1 by mouth once daily as needed allergy AZITHROMYCIN 250 MG TABS (AZITHROMYCIN) 2po qd for 1 day, then 1po qd for 4days, then stop  Current Allergies (reviewed today): No known allergies    Past Medical History:    Reviewed history from 09/07/2007 and no changes required:       Colonic polyps, hx of       Hypertension       Allergic rhinitis       DJD       Hypothyroidism       s/p tx for hyperthyrodism       mult pulm nodules  - non-specific       GERD       ? hx of rheumatic fever       Osteopenia       Hyperlipidemia       Osteoarthritis  Past Surgical History:    Reviewed history from 09/07/2007 and no changes required:       Hysterectomy       Tonsillectomy       s/p carpal tunnel and other right hand surgury   Family History:    Reviewed history from 09/07/2007 and no changes required:       aunt with arthritis, heart disease, HTN, DM  Social History:    Reviewed history from 09/07/2007 and no changes required:       Current Smoker       Alcohol use-no  Divorced       2 children       work - Training and development officer    Review of Systems  The patient denies anorexia, fever, weight loss, weight gain, vision loss, decreased hearing, hoarseness, chest pain, syncope, dyspnea on exertion, peripheral edema, prolonged cough, headaches, hemoptysis, abdominal pain, melena, hematochezia, severe indigestion/heartburn, hematuria, incontinence, muscle weakness, suspicious skin lesions, transient blindness, difficulty walking, depression, unusual weight change, abnormal bleeding, enlarged lymph nodes, angioedema, and breast masses.         all otherwise negative ; saw hand surgeon at [facility] yesterday - after xray he explained he thnks she has nervie damage but needs more info regarding the details of her previous hand surgury   Physical Exam  General:     alert and well-developed.   Head:     Normocephalic and atraumatic without obvious abnormalities. No apparent alopecia or balding. Eyes:     No corneal or conjunctival inflammation noted. EOMI. Perrla. Ears:     bilat tm's mild red, sinus nontender Nose:      External nasal examination shows no deformity or inflammation. Nasal mucosa are pink and moist without lesions or exudates. Mouth:     pharyngeal erythema and fair dentition.   Neck:     No deformities, masses, or tenderness noted. Lungs:     Normal respiratory effort, chest expands symmetrically. Lungs are clear to auscultation, no crackles or wheezes. Abdomen:     Bowel sounds positive,abdomen soft and non-tender without masses, organomegaly or hernias noted. Msk:     no joint tenderness and no joint swelling.   Extremities:     no edema, no ulcers  Neurologic:     cranial nerves II-XII intact, strength normal in all extremities, DTRs symmetrical and normal, and finger-to-nose normal.      Impression & Recommendations:  Problem # 1:  DIZZINESS (ICD-780.4)  Her updated medication list for this problem includes:    Cetirizine Hcl 10 Mg Tabs (Cetirizine hcl) .Marland Kitchen... 1 by mouth once daily as needed allergy does not appear orthostatic, will refer neurology _- [facility] per pt request , and suspect she will need MRi and carotids done there; did have recetn ecg and echo per dr gamble, will also check rouitine labs to r/o anemia, etc Orders: TLB-BMP (Basic Metabolic Panel-BMET) (80048-METABOL) TLB-CBC Platelet - w/Differential (85025-CBCD) Neurology Referral (Neuro)   Problem # 2:  URI (ICD-465.9)  Her updated medication list for this problem includes:    Ecotrin Low Strength 81 Mg Tbec (Aspirin) .Marland Kitchen... 1 by mouth qd    Cetirizine Hcl 10 Mg Tabs (Cetirizine hcl) .Marland Kitchen... 1 by mouth once daily as needed allergy tx with antibx, mucinex otc as needed   Problem # 3:  HYPERTENSION (ICD-401.9)  Her updated medication list for this problem includes:    Norvasc 5 Mg Tabs (Amlodipine besylate) .Marland Kitchen... 1 by mouth qd    Hydrochlorothiazide 25 Mg Tabs (Hydrochlorothiazide) .Marland Kitchen... 1 by mouth qd  BP today: 130/82 Prior BP: 122/80 (05/23/2008)  Labs Reviewed: Creat: 0.8 (09/04/2007)  Chol: 186 (09/04/2007)   HDL: 49.1 (09/04/2007)   LDL: 121 (09/04/2007)   TG: 77 (09/04/2007) treat as above, f/u any worsening signs or symptoms   Complete Medication List: 1)  Levoxyl 88 Mcg Tabs (Levothyroxine sodium) .Marland Kitchen.. 1 by mouth qd 2)  Norvasc 5 Mg Tabs (Amlodipine besylate) .Marland Kitchen.. 1 by mouth qd 3)  Ecotrin Low Strength 81 Mg Tbec (Aspirin) .Marland Kitchen.. 1 by mouth qd 4)  Actonel 35 Mg Tabs (Risedronate  sodium) .Marland Kitchen.. 1 by mouth q wk 5)  Hydrochlorothiazide 25 Mg Tabs (Hydrochlorothiazide) .Marland Kitchen.. 1 by mouth qd 6)  Vitamin D 1000 Unit Caps (Cholecalciferol) .Marland Kitchen.. 1 by mouth qd 7)  Omeprazole 20 Mg Cpdr (Omeprazole) .... 2 by mouth qd 8)  Cetirizine Hcl 10 Mg Tabs (Cetirizine hcl) .Marland Kitchen.. 1 by mouth once daily as needed allergy 9)  Azithromycin 250 Mg Tabs (Azithromycin) .... 2po qd for 1 day, then 1po qd for 4days, then stop   Patient Instructions: 1)  Please take all new medications as prescribed 2)  Continue all medications that you may have been taking previously 3)  Please go to the Lab in the basement for your blood tests today 4)  You will be contacted about the referral(s) to: Neurology at [facility] 5)  you can also take the mucinex OTC as needed for the congestion 6)  Please schedule a follow-up appointment in 6 months with CPX labs   Prescriptions: AZITHROMYCIN 250 MG TABS (AZITHROMYCIN) 2po qd for 1 day, then 1po qd for 4days, then stop  #6 x 1   Entered and Authorized by:   Corwin Levins MD   Signed by:   Corwin Levins MD on 06/26/2008   Method used:   Print then Give to Patient   RxID:   [id number] AZITHROMYCIN 250 MG TABS (AZITHROMYCIN) 2po qd for 1 day, then 1po qd for 4days, then stop  #6 x 1   Entered and Authorized by:   Corwin Levins MD   Signed by:   Corwin Levins MD on 06/26/2008   Method used:   Print then Give to Patient   RxID:   [id number]  ]

## 2010-09-22 NOTE — Assessment & Plan Note (Signed)
Summary: FU---$50---PER  PT D/T---STC   Vital Signs:  Patient Profile:   65 Years Old Female Weight:      115.8 pounds Temp:     97.3 degrees F oral Pulse rate:   83 / minute BP sitting:   122 / 80  (right arm) Cuff size:   regular  Vitals Entered By: Payton Spark CMA (May 23, 2008 9:07 AM)                 Chief Complaint:  FU.  History of Present Illness: here with acute onset x 3 days fever, prod cough, headache, malaise, and dizziness/lightheaded, last fall about 4 yrs ago - none since then; compliant with meds;     Updated Prior Medication List: LEVOXYL 88 MCG  TABS (LEVOTHYROXINE SODIUM) 1 by mouth qd NORVASC 5 MG  TABS (AMLODIPINE BESYLATE) 1 by mouth qd ECOTRIN LOW STRENGTH 81 MG  TBEC (ASPIRIN) 1 by mouth qd ACTONEL 35 MG  TABS (RISEDRONATE SODIUM) 1 by mouth q wk HYDROCHLOROTHIAZIDE 25 MG  TABS (HYDROCHLOROTHIAZIDE) 1 by mouth qd VITAMIN D 1000 UNIT  CAPS (CHOLECALCIFEROL) 1 by mouth qd OMEPRAZOLE 20 MG  CPDR (OMEPRAZOLE) 2 by mouth qd CETIRIZINE HCL 10 MG TABS (CETIRIZINE HCL) 1 by mouth once daily as needed allergy AZITHROMYCIN 250 MG TABS (AZITHROMYCIN) 2po qd for 1 day, then 1po qd for 4days, then stop  Current Allergies (reviewed today): No known allergies   Past Medical History:    Reviewed history from 09/07/2007 and no changes required:       Colonic polyps, hx of       Hypertension       Allergic rhinitis       DJD       Hypothyroidism       s/p tx for hyperthyrodism       mult pulm nodules  - non-specific       GERD       ? hx of rheumatic fever       Osteopenia       Hyperlipidemia       Osteoarthritis  Past Surgical History:    Reviewed history from 09/07/2007 and no changes required:       Hysterectomy       Tonsillectomy       s/p carpal tunnel and other right hand surgury   Social History:    Reviewed history from 09/07/2007 and no changes required:       Current Smoker       Alcohol use-no       Divorced        2 children       work - Training and development officer    Review of Systems       all otherwise negative    Physical Exam  General:     alert and underweight appearing.  , mild ill  Head:     Normocephalic and atraumatic without obvious abnormalities. No apparent alopecia or balding. Eyes:     No corneal or conjunctival inflammation noted. EOMI. Perrla.  Ears:     bilat tm's red, sinus nontender Nose:     nasal dischargemucosal pallor and mucosal erythema.   Mouth:     pharyngeal erythema and fair dentition.   Neck:     supple and full ROM.   Lungs:     Normal respiratory effort, chest expands symmetrically. Lungs are clear to auscultation, no crackles or wheezes. Heart:     Normal rate  and regular rhythm. S1 and S2 normal without gallop, murmur, click, rub or other extra sounds. Extremities:     no edema, no ulcers     Impression & Recommendations:  Problem # 1:  BRONCHITIS-ACUTE (ICD-466.0)  The following medications were removed from the medication list:    Ceftin 250 Mg Tabs (Cefuroxime axetil) .Marland Kitchen... 1 by mouth bid    Promethazine-codeine 6.25-10 Mg/44ml Syrp (Promethazine-codeine) .Marland Kitchen... 1 tsp by mouth qid prn  Her updated medication list for this problem includes:    Azithromycin 250 Mg Tabs (Azithromycin) .Marland Kitchen... 2po qd for 1 day, then 1po qd for 4days, then stop treat as above, f/u any worsening signs or symptoms   Problem # 2:  ALLERGIC RHINITIS (ICD-477.9)  Her updated medication list for this problem includes:    Cetirizine Hcl 10 Mg Tabs (Cetirizine hcl) .Marland Kitchen... 1 by mouth once daily as needed allergy treat as above, f/u any worsening signs or symptoms   Problem # 3:  HAND PAIN, RIGHT (ICD-729.5) chronic , work related injury s/p several surgury - requests second opinion at [facility] surgury Orders: Orthopedic Surgeon Referral (Ortho Surgeon)   Problem # 4:  HYPERTENSION (ICD-401.9)  Her updated medication list for this problem includes:     Norvasc 5 Mg Tabs (Amlodipine besylate) .Marland Kitchen... 1 by mouth qd    Hydrochlorothiazide 25 Mg Tabs (Hydrochlorothiazide) .Marland Kitchen... 1 by mouth qd  BP today: 122/80 Prior BP: 108/72 (11/06/2007)  Labs Reviewed: Creat: 0.8 (09/04/2007) Chol: 186 (09/04/2007)   HDL: 49.1 (09/04/2007)   LDL: 121 (09/04/2007)   TG: 77 (09/04/2007)   Complete Medication List: 1)  Levoxyl 88 Mcg Tabs (Levothyroxine sodium) .Marland Kitchen.. 1 by mouth qd 2)  Norvasc 5 Mg Tabs (Amlodipine besylate) .Marland Kitchen.. 1 by mouth qd 3)  Ecotrin Low Strength 81 Mg Tbec (Aspirin) .Marland Kitchen.. 1 by mouth qd 4)  Actonel 35 Mg Tabs (Risedronate sodium) .Marland Kitchen.. 1 by mouth q wk 5)  Hydrochlorothiazide 25 Mg Tabs (Hydrochlorothiazide) .Marland Kitchen.. 1 by mouth qd 6)  Vitamin D 1000 Unit Caps (Cholecalciferol) .Marland Kitchen.. 1 by mouth qd 7)  Omeprazole 20 Mg Cpdr (Omeprazole) .... 2 by mouth qd 8)  Cetirizine Hcl 10 Mg Tabs (Cetirizine hcl) .Marland Kitchen.. 1 by mouth once daily as needed allergy 9)  Azithromycin 250 Mg Tabs (Azithromycin) .... 2po qd for 1 day, then 1po qd for 4days, then stop  Other Orders: Influenza Vaccine NON MCR (91478)   Patient Instructions: 1)  Please take all new medications as prescribed 2)  Continue all medications that you may have been taking previously 3)  You will be contacted about the referral(s) to: [facility] hand surgury for the right hand 4)  Please schedule a follow-up appointment in 3 months (after jan 1) with CPX labs   Prescriptions: AZITHROMYCIN 250 MG TABS (AZITHROMYCIN) 2po qd for 1 day, then 1po qd for 4days, then stop  #6 x 1   Entered and Authorized by:   Corwin Levins MD   Signed by:   Corwin Levins MD on 05/23/2008   Method used:   Print then Give to Patient   RxID:   [id number] CETIRIZINE HCL 10 MG TABS (CETIRIZINE HCL) 1 by mouth once daily as needed allergy  #30 x 11   Entered and Authorized by:   Corwin Levins MD   Signed by:   Corwin Levins MD on 05/23/2008   Method used:   Print then Give to Patient   RxID:   [id number]  ]  Influenza Vaccine    Vaccine Type: Fluvax Non-MCR    Site: left deltoid    Mfr: [email or web address]    Dose: 0.5 ml    Route: IM    Given by: Payton Spark CMA    Exp. Date: 02/19/2009    Lot #: [id number]    VIS given: 03/16/07 version given May 23, 2008.

## 2010-09-22 NOTE — Letter (Signed)
Summary: [facility]   [facility]   Imported By: Roderic Ovens 10/02/2009 09:42:27  _____________________________________________________________________  External Attachment:    Type:   Image     Comment:   External Document

## 2010-09-22 NOTE — Progress Notes (Signed)
Summary: Amlodipine refill  Phone Note Refill Request Message from:  Fax from Pharmacy on October 14, 2009 3:14 PM  Refills Requested: Medication #1:  AMLODIPINE BESYLATE 5 MG TABS 1 by mouth once daily   Notes: [facility] ([address]) Initial call taken by: Lucious Groves,  October 14, 2009 3:14 PM    Prescriptions: AMLODIPINE BESYLATE 5 MG TABS (AMLODIPINE BESYLATE) 1 by mouth once daily  #90 x 2   Entered by:   Lucious Groves   Authorized by:   Corwin Levins MD   Signed by:   Lucious Groves on 10/14/2009   Method used:   Electronically to        [facility] [phone]* (retail)       [address]       [place], [place]  [address]       Ph: [phone]       Fax: [phone]   RxID:   [id number]

## 2010-09-22 NOTE — Progress Notes (Signed)
  Phone Note Call from Patient Call back at Home Phone [phone]   Caller: [phone] Call For: Corwin Levins MD Summary of Call: per Geanie Berlin call requseting handicapp form to be signed by doctor .Marland Kitchen...on cart Initial call taken by: Shelbie Proctor,  September 03, 2008 11:38 AM  Follow-up for Phone Call        ok - done hardcopy to LIM side B - debra  Follow-up by: Corwin Levins MD,  September 03, 2008 12:45 PM  Additional Follow-up for Phone Call Additional follow up Details #1::        [employer]  spoke with pt made aware application was ready Additional Follow-up by: Shelbie Proctor,  September 03, 2008 1:37 PM

## 2010-09-22 NOTE — Assessment & Plan Note (Signed)
Summary: PER D/T  FU  STC   Vital Signs:  Patient profile:   65 year old female Height:      62.5 inches Weight:      115.50 pounds BMI:     20.86 O2 Sat:      98 % on Room air Temp:     97.3 degrees F oral Pulse rate:   90 / minute BP sitting:   124 / 70  (left arm) Cuff size:   regular  Vitals Entered ByZella Ball Ewing (December 10, 2009 9:06 AM)  O2 Flow:  Room air CC: Discuss getting labs, left shoulder itching/RE   CC:  Discuss getting labs and left shoulder itching/RE.  History of Present Illness: overall doing well,  except for incidental onset itch and erythema area to left upper back which she states might be worse since she scratches it for the past wk;  no pain or swelling in the area ;  has some ? about whether she needs to cont the alendronate but has had good compliance so far and seems to tolerate well;  Pt denies CP, sob, doe, wheezing, orthopnea, pnd, worsening LE edema, palps, dizziness or syncope   Pt denies new neuro symptoms such as headache, facial or extremity weakness.  she has lab reports today from an recent insurance exam where she was turned down - all normal except + for cotinine.  She is confused as to why she was turned down and she only got the standard response to "see your personal physician."     Problems Prior to Update: 1)  Rash-nonvesicular  (ICD-782.1) 2)  Tobacco Abuse  (ICD-305.1) 3)  Murmur  (ICD-785.2) 4)  Hyperlipidemia  (ICD-272.4) 5)  Hypertension  (ICD-401.9) 6)  Disc Disease, Cervical  (ICD-722.4) 7)  Arm Pain, Right  (ICD-729.5) 8)  Arm Pain, Right  (ICD-729.5) 9)  Preventive Health Care  (ICD-V70.0) 10)  Hypokalemia  (ICD-276.8) 11)  Preventive Health Care  (ICD-V70.0) 12)  Arm Pain, Right  (ICD-729.5) 13)  Uri  (ICD-465.9) 14)  Uri  (ICD-465.9) 15)  Dizziness  (ICD-780.4) 16)  Hand Pain, Right  (ICD-729.5) 17)  Need Proph Vaccination&inoculat Oth Viral Dz  (ICD-V04.89) 18)  Abdominal Pain Right Upper Quadrant  (ICD-789.01) 19)   Chest Pain  (ICD-786.50) 20)  Osteoarthritis  (ICD-715.90) 21)  Preventive Health Care  (ICD-V70.0) 22)  Osteopenia  (ICD-733.90) 23)  Gerd  (ICD-530.81) 24)  Hypothyroidism  (ICD-244.9) 25)  Allergic Rhinitis  (ICD-477.9) 26)  Colonic Polyps, Hx of  (ICD-V12.72) 27)  Routine General Medical Exam@health  Care Facl  (ICD-V70.0)  Medications Prior to Update: 1)  Levothyroxine Sodium 100 Mcg Tabs (Levothyroxine Sodium) .Marland Kitchen.. 1 By Mouth Once Daily 2)  Amlodipine Besylate 5 Mg Tabs (Amlodipine Besylate) .Marland Kitchen.. 1 By Mouth Once Daily 3)  Ecotrin Low Strength 81 Mg  Tbec (Aspirin) .Marland Kitchen.. 1 By Mouth Qd 4)  Alendronate Sodium 70 Mg Tabs (Alendronate Sodium) .Marland Kitchen.. 1 By Mouth Q Wk 5)  Vitamin D 1000 Unit  Caps (Cholecalciferol) .Marland Kitchen.. 1 By Mouth Qd 6)  Hydrochlorothiazide 25 Mg Tabs (Hydrochlorothiazide) .Marland Kitchen.. 1 By Mouth Once Daily 7)  Hydrocodone-Acetaminophen 5-325 Mg Tabs (Hydrocodone-Acetaminophen) .Marland Kitchen.. 1 - 2 By Mouth Q 6 Hrs As Needed 8)  Fish Oil   Oil (Fish Oil) .Marland Kitchen.. 1 Tab By Mouth Once Daily  Current Medications (verified): 1)  Levothyroxine Sodium 100 Mcg Tabs (Levothyroxine Sodium) .Marland Kitchen.. 1 By Mouth Once Daily 2)  Amlodipine Besylate 5 Mg Tabs (Amlodipine Besylate) .Marland KitchenMarland KitchenMarland Kitchen 1  By Mouth Once Daily 3)  Ecotrin Low Strength 81 Mg  Tbec (Aspirin) .Marland Kitchen.. 1 By Mouth Qd 4)  Alendronate Sodium 70 Mg Tabs (Alendronate Sodium) .Marland Kitchen.. 1 By Mouth Q Wk 5)  Vitamin D 1000 Unit  Caps (Cholecalciferol) .Marland Kitchen.. 1 By Mouth Qd 6)  Hydrochlorothiazide 25 Mg Tabs (Hydrochlorothiazide) .Marland Kitchen.. 1 By Mouth Once Daily 7)  Hydrocodone-Acetaminophen 5-325 Mg Tabs (Hydrocodone-Acetaminophen) .Marland Kitchen.. 1 - 2 By Mouth Q 6 Hrs As Needed 8)  Fish Oil   Oil (Fish Oil) .Marland Kitchen.. 1 Tab By Mouth Once Daily 9)  Triamcinolone Acetonide 0.1 % Crea (Triamcinolone Acetonide) .... Use Asd Two Times A Day As Needed  Allergies (verified): No Known Drug Allergies  Past History:  Past Medical History: Last updated: 07/18/2009 Hyperlipidemia Colonic polyps, hx  of Hypertension Allergic rhinitis DJD Hypothyroidism s/p tx for hyperthyrodism mult pulm nodules  - non-specific GERD ? hx of rheumatic fever Osteopenia Osteoarthritis c-spine disc dz lumbar disc dz  Past Surgical History: Last updated: 07/21/2009 Hysterectomy thyroidectomy Tonsillectomy s/p carpal tunnel and other right hand surgury  Social History: Last updated: 09/07/2007 Current Smoker Alcohol use-no Divorced 2 children work - [employer]  Risk Factors: Smoking Status: current (09/07/2007)  Review of Systems       all otherwise negative per pt -    Physical Exam  General:  alert and underweight appearing.   Head:  normocephalic and atraumatic.   Eyes:  vision grossly intact, pupils equal, and pupils round.   Ears:  R ear normal and L ear normal.   Nose:  no external deformity and no nasal discharge.   Mouth:  no gingival abnormalities and pharynx pink and moist.   Neck:  supple and no masses.   Lungs:  normal respiratory effort and normal breath sounds.   Heart:  normal rate and regular rhythm.   Extremities:  no edema, no erythema  Skin:  1 cm area to left trapezoid area mild nonraised erythema nontender   Impression & Recommendations:  Problem # 1:  RASH-NONVESICULAR (ICD-782.1)  left upper back - ? contact dermatitis  - for triam cream as needed   Her updated medication list for this problem includes:    Triamcinolone Acetonide 0.1 % Crea (Triamcinolone acetonide) ..... Use asd two times a day as needed  Problem # 2:  OSTEOPENIA (ICD-733.90)  Her updated medication list for this problem includes:    Alendronate Sodium 70 Mg Tabs (Alendronate sodium) .Marland Kitchen... 1 by mouth q wk urged pt to start meds after d/w pt most recent dxa, to cont excercise, calcium, vit D otc  Problem # 3:  HYPERTENSION (ICD-401.9)  Her updated medication list for this problem includes:    Amlodipine Besylate 5 Mg Tabs (Amlodipine besylate) .Marland Kitchen... 1 by mouth once daily     Hydrochlorothiazide 25 Mg Tabs (Hydrochlorothiazide) .Marland Kitchen... 1 by mouth once daily  BP today: 124/70 Prior BP: 150/80 (07/21/2009)  Labs Reviewed: K+: 3.9 (06/17/2009) Creat: : 0.7 (06/17/2009)   Chol: 152 (06/17/2009)   HDL: 61.50 (06/17/2009)   LDL: 75 (06/17/2009)   TG: 76.0 (06/17/2009) stable overall by hx and exam, ok to continue meds/tx as is   Problem # 4:  HYPERLIPIDEMIA (ICD-272.4)  Labs Reviewed: SGOT: 21 (06/17/2009)   SGPT: 18 (06/17/2009)   HDL:61.50 (06/17/2009), 56.8 (09/04/2008)  LDL:75 (06/17/2009), 78 (09/04/2008)  Chol:152 (06/17/2009), 149 (09/04/2008)  Trig:76.0 (06/17/2009), 73 (09/04/2008) stable overall by hx and exam, ok to continue meds/tx as is, pt to remain with fish oil at this  time, goal ldl < 100  Complete Medication List: 1)  Levothyroxine Sodium 100 Mcg Tabs (Levothyroxine sodium) .Marland Kitchen.. 1 by mouth once daily 2)  Amlodipine Besylate 5 Mg Tabs (Amlodipine besylate) .Marland Kitchen.. 1 by mouth once daily 3)  Ecotrin Low Strength 81 Mg Tbec (Aspirin) .Marland Kitchen.. 1 by mouth qd 4)  Alendronate Sodium 70 Mg Tabs (Alendronate sodium) .Marland Kitchen.. 1 by mouth q wk 5)  Vitamin D 1000 Unit Caps (Cholecalciferol) .Marland Kitchen.. 1 by mouth qd 6)  Hydrochlorothiazide 25 Mg Tabs (Hydrochlorothiazide) .Marland Kitchen.. 1 by mouth once daily 7)  Hydrocodone-acetaminophen 5-325 Mg Tabs (Hydrocodone-acetaminophen) .Marland Kitchen.. 1 - 2 by mouth q 6 hrs as needed 8)  Fish Oil Oil (Fish oil) .Marland Kitchen.. 1 tab by mouth once daily 9)  Triamcinolone Acetonide 0.1 % Crea (Triamcinolone acetonide) .... Use asd two times a day as needed  Patient Instructions: 1)  Please take all new medications as prescribed 2)  Continue all previous medications as before this visit  3)  Please schedule a follow-up appointment in 6 months with CPX labs Prescriptions: TRIAMCINOLONE ACETONIDE 0.1 % CREA (TRIAMCINOLONE ACETONIDE) use asd two times a day as needed  #1 x 1   Entered and Authorized by:   Corwin Levins MD   Signed by:   Corwin Levins MD on 12/10/2009    Method used:   Print then Give to Patient   RxID:   [id number] ALENDRONATE SODIUM 70 MG TABS (ALENDRONATE SODIUM) 1 by mouth q wk  #12 x 3   Entered and Authorized by:   Corwin Levins MD   Signed by:   Corwin Levins MD on 12/10/2009   Method used:   Print then Give to Patient   RxID:   [id number]

## 2010-09-22 NOTE — Assessment & Plan Note (Signed)
Summary: cold/#/cd   Vital Signs:  Patient profile:   65 year old female Height:      62 inches Weight:      115.38 pounds BMI:     21.18 O2 Sat:      97 % on Room air Temp:     97.7 degrees F oral Pulse rate:   88 / minute BP sitting:   120 / 70  (left arm) Cuff size:   regular  Vitals Entered By: Zella Ball Ewing CMA Duncan Dull) (February 19, 2010 10:44 AM)  O2 Flow:  Room air CC: Congestion, coughing, constipation/RE, Abdominal Pain   CC:  Congestion, coughing, constipation/RE, and Abdominal Pain.  History of Present Illness: here with acute onset x 2 days  fever , ST, myalgias, general weaknes and now prod cough with greenish sputum,  but Pt denies CP, sob, doe, wheezing, orthopnea, pnd, worsening LE edema, palps, dizziness or syncope Pt denies new neuro symptoms such as headache, facial or extremity weakness   also mentions ongoing problem wtih recurrent constipation with abd crampy pains, strain at stool and reduced freq BM/s, with mild nausea but no vomiting or blood.  No recent dysphagia, back pain, fever, diarrhea, wt loss.  Has had decreased activity level lately, as well as chronic narcotic med required for the ongoing pain as described in previous HPI's.  No change in chronic pain freq, severity, location, aggrevation or alleviating factors , and pain control always suboptimal per pt.   Has some decreased mood as well, but denies suicidal ideation, or panic.    Preventive Screening-Counseling & Management      Drug Use:  no.    Problems Prior to Update: 1)  Bronchitis-acute  (ICD-466.0) 2)  Rash-nonvesicular  (ICD-782.1) 3)  Tobacco Abuse  (ICD-305.1) 4)  Murmur  (ICD-785.2) 5)  Hyperlipidemia  (ICD-272.4) 6)  Hypertension  (ICD-401.9) 7)  Disc Disease, Cervical  (ICD-722.4) 8)  Arm Pain, Right  (ICD-729.5) 9)  Arm Pain, Right  (ICD-729.5) 10)  Preventive Health Care  (ICD-V70.0) 11)  Hypokalemia  (ICD-276.8) 12)  Preventive Health Care  (ICD-V70.0) 13)  Arm Pain, Right   (ICD-729.5) 14)  Uri  (ICD-465.9) 15)  Uri  (ICD-465.9) 16)  Dizziness  (ICD-780.4) 17)  Hand Pain, Right  (ICD-729.5) 18)  Need Proph Vaccination&inoculat Oth Viral Dz  (ICD-V04.89) 19)  Abdominal Pain Right Upper Quadrant  (ICD-789.01) 20)  Chest Pain  (ICD-786.50) 21)  Osteoarthritis  (ICD-715.90) 22)  Preventive Health Care  (ICD-V70.0) 23)  Osteopenia  (ICD-733.90) 24)  Gerd  (ICD-530.81) 25)  Hypothyroidism  (ICD-244.9) 26)  Allergic Rhinitis  (ICD-477.9) 27)  Colonic Polyps, Hx of  (ICD-V12.72) 28)  Routine General Medical Exam@health  Care Facl  (ICD-V70.0)  Medications Prior to Update: 1)  Levothyroxine Sodium 100 Mcg Tabs (Levothyroxine Sodium) .Marland Kitchen.. 1 By Mouth Once Daily 2)  Amlodipine Besylate 5 Mg Tabs (Amlodipine Besylate) .Marland Kitchen.. 1 By Mouth Once Daily 3)  Ecotrin Low Strength 81 Mg  Tbec (Aspirin) .Marland Kitchen.. 1 By Mouth Qd 4)  Alendronate Sodium 70 Mg Tabs (Alendronate Sodium) .Marland Kitchen.. 1 By Mouth Q Wk 5)  Vitamin D 1000 Unit  Caps (Cholecalciferol) .Marland Kitchen.. 1 By Mouth Qd 6)  Hydrochlorothiazide 25 Mg Tabs (Hydrochlorothiazide) .Marland Kitchen.. 1 By Mouth Once Daily 7)  Hydrocodone-Acetaminophen 5-325 Mg Tabs (Hydrocodone-Acetaminophen) .Marland Kitchen.. 1 - 2 By Mouth Q 6 Hrs As Needed 8)  Fish Oil   Oil (Fish Oil) .Marland Kitchen.. 1 Tab By Mouth Once Daily 9)  Triamcinolone Acetonide 0.1 % Crea (Triamcinolone  Acetonide) .... Use Asd Two Times A Day As Needed  Current Medications (verified): 1)  Levothyroxine Sodium 100 Mcg Tabs (Levothyroxine Sodium) .Marland Kitchen.. 1 By Mouth Once Daily 2)  Amlodipine Besylate 5 Mg Tabs (Amlodipine Besylate) .Marland Kitchen.. 1 By Mouth Once Daily 3)  Ecotrin Low Strength 81 Mg  Tbec (Aspirin) .Marland Kitchen.. 1 By Mouth Qd 4)  Alendronate Sodium 70 Mg Tabs (Alendronate Sodium) .Marland Kitchen.. 1 By Mouth Q Wk 5)  Vitamin D 1000 Unit  Caps (Cholecalciferol) .Marland Kitchen.. 1 By Mouth Qd 6)  Hydrochlorothiazide 25 Mg Tabs (Hydrochlorothiazide) .Marland Kitchen.. 1 By Mouth Once Daily 7)  Hydrocodone-Acetaminophen 5-325 Mg Tabs (Hydrocodone-Acetaminophen) .Marland Kitchen..  1 - 2 By Mouth Q 6 Hrs As Needed 8)  Fish Oil   Oil (Fish Oil) .Marland Kitchen.. 1 Tab By Mouth Once Daily 9)  Triamcinolone Acetonide 0.1 % Crea (Triamcinolone Acetonide) .... Use Asd Two Times A Day As Needed 10)  Hydrocodone-Homatropine 5-1.5 Mg/33ml Syrp (Hydrocodone-Homatropine) .Marland Kitchen.. 1 Tsp By Mouth Q 6 Hrs As Needed Cough 11)  Azithromycin 250 Mg Tabs (Azithromycin) .... 2po Qd For 1 Day, Then 1po Qd For 4days, Then Stop 12)  Cymbalta 60 Mg Cpep (Duloxetine Hcl) .Marland Kitchen.. 1 By Mouth Once Daily  Allergies (verified): No Known Drug Allergies  Past History:  Past Medical History: Last updated: 07/18/2009 Hyperlipidemia Colonic polyps, hx of Hypertension Allergic rhinitis DJD Hypothyroidism s/p tx for hyperthyrodism mult pulm nodules  - non-specific GERD ? 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   Head:  normocephalic and atraumatic.   Eyes:  vision grossly intact, pupils equal, and pupils round.   Ears:  bilat tm's red, sinus nontender Nose:  nasal dischargemucosal pallor and mucosal edema.   Mouth:  pharyngeal erythema and fair dentition.   Neck:  supple and no masses.   Lungs:  normal respiratory effort and normal breath sounds.   Heart:  normal rate and regular rhythm.   Abdomen:  soft, non-tender, and normal bowel sounds.   Msk:  no joint tenderness and no joint swelling.  , but with chronic  right hand impairment as before Extremities:  no edema, no erythema  Psych:  dysphoric affect and slightly anxious.     Impression & Recommendations:  Problem # 1:  BRONCHITIS-ACUTE (ICD-466.0)  Her updated medication list for this problem includes:    Hydrocodone-homatropine 5-1.5 Mg/44ml Syrp (Hydrocodone-homatropine) .Marland Kitchen... 1 tsp by mouth q 6 hrs as needed cough    Azithromycin 250 Mg Tabs (Azithromycin) .Marland Kitchen... 2po qd for 1 day, then 1po qd for 4days, then stop treat as above, f/u any worsening signs or symptoms   Problem # 2:  CONSTIPATION (ICD-564.00) mild, for miralax daily , as well as dulcolox as needed   Problem # 3:  ARM PAIN, RIGHT (ICD-729.5) with chronic pain - to start the cymbalta 30 once daily for 1 wk, then 60 once daily   Problem # 4:  HYPERTENSION (ICD-401.9)  Her updated medication list for this  problem includes:    Amlodipine Besylate 5 Mg Tabs (Amlodipine besylate) .Marland Kitchen... 1 by mouth once daily    Hydrochlorothiazide 25 Mg Tabs (Hydrochlorothiazide) .Marland Kitchen... 1 by mouth once daily  BP today: 120/70 Prior BP: 124/70 (12/10/2009)  Labs Reviewed: K+: 3.9 (06/17/2009) Creat: : 0.7 (06/17/2009)   Chol: 152 (06/17/2009)   HDL: 61.50 (06/17/2009)   LDL: 75 (06/17/2009)   TG: 76.0 (06/17/2009) stable overall by hx and exam, ok to continue meds/tx as is   Complete Medication List: 1)  Levothyroxine Sodium 100 Mcg Tabs (Levothyroxine sodium) .Marland Kitchen.. 1 by mouth once daily 2)  Amlodipine Besylate 5 Mg Tabs (Amlodipine besylate) .Marland Kitchen.. 1 by mouth once daily 3)  Ecotrin Low Strength 81 Mg Tbec (Aspirin) .Marland Kitchen.. 1 by mouth qd 4)  Alendronate Sodium 70 Mg Tabs (Alendronate sodium) .Marland Kitchen.. 1 by mouth q wk 5)  Vitamin D 1000 Unit Caps (Cholecalciferol) .Marland Kitchen.. 1 by mouth qd 6)  Hydrochlorothiazide 25 Mg Tabs (Hydrochlorothiazide) .Marland Kitchen.. 1 by mouth once daily 7)  Hydrocodone-acetaminophen 5-325 Mg Tabs (Hydrocodone-acetaminophen) .Marland Kitchen.. 1 - 2 by mouth q 6 hrs as needed 8)  Fish Oil Oil (Fish oil)  .Marland Kitchen.. 1 tab by mouth once daily 9)  Triamcinolone Acetonide 0.1 % Crea (Triamcinolone acetonide) .... Use asd two times a day as needed 10)  Hydrocodone-homatropine 5-1.5 Mg/76ml Syrp (Hydrocodone-homatropine) .Marland Kitchen.. 1 tsp by mouth q 6 hrs as needed cough 11)  Azithromycin 250 Mg Tabs (Azithromycin) .... 2po qd for 1 day, then 1po qd for 4days, then stop 12)  Cymbalta 60 Mg Cpep (Duloxetine hcl) .Marland Kitchen.. 1 by mouth once daily   Patient Instructions: 1)  Please take all new medications as prescribed 2)  Continue all previous medications as before this visit  3)  You should also take the miralax OTC daily, and even dulcolox oTC if needed 4)  start the cymbalta at 30 mg per day for 1 wk, then 60 mg per day after that Prescriptions: CYMBALTA 60 MG CPEP (DULOXETINE HCL) 1 by mouth once daily  #30 x 11   Entered and Authorized by:   Corwin Levins MD   Signed by:   Corwin Levins MD on 02/19/2010   Method used:   Print then Give to Patient   RxID:   [id number] AZITHROMYCIN 250 MG TABS (AZITHROMYCIN) 2po qd for 1 day, then 1po qd for 4days, then stop  #6 x 1   Entered and Authorized by:   Corwin Levins MD   Signed by:   Corwin Levins MD on 02/19/2010   Method used:   Print then Give to Patient   RxID:   [id number] HYDROCODONE-HOMATROPINE 5-1.5 MG/5ML SYRP (HYDROCODONE-HOMATROPINE) 1 tsp by mouth q 6 hrs as needed cough  #6oz x 1   Entered and Authorized by:   Corwin Levins MD   Signed by:   Corwin Levins MD on 02/19/2010   Method used:   Print then Give to Patient   RxID:   [id number]

## 2010-09-22 NOTE — Progress Notes (Signed)
Summary: appointment  Phone Note Outgoing Call   Call placed by: Dagoberto Reef,  June 27, 2008 2:53 PM Call placed to: Specialist Action Taken: Appt scheduled Summary of Call: Dr Jonny Ruiz the first appointment that any neurologist will have at [facility] is in january, is this going to be ok.Please advise. Initial call taken by: Dagoberto Reef,  June 27, 2008 2:55 PM  Follow-up for Phone Call        should be ok Follow-up by: Corwin Levins MD,  June 27, 2008 3:16 PM

## 2010-09-22 NOTE — Progress Notes (Signed)
Summary: [employer]  Phone Note Call from Patient Call back at Home Phone [phone]   Summary of Call: Patient called the office today wanting to know what info MD provided to [facility] for her to not be approved for insurance. I made the patient aware that we do not have any info requests/HIPAA forms for this company and most likely no info has been released. Patient will contact her local rep here in town and sign release to provide her records to [facility]. Initial call taken by: Lucious Groves,  November 17, 2009 2:41 PM

## 2010-09-22 NOTE — Progress Notes (Signed)
Summary: Record request  Request for records received from [facility]. Request forwarded to [employer]. [name]  August 08, 2009 10:24 AM  Appended Document: Record request Request for records received from  [facility]. Request forwarded to [employer].

## 2010-09-22 NOTE — Letter (Signed)
Summary: Cervical Spine(Follow Up)/[facility]  Cervical Spine(Follow Up)/[facility]   Imported By: Sherian Rein 11/04/2008 09:05:20  _____________________________________________________________________  External Attachment:    Type:   Image     Comment:   External Document

## 2010-09-22 NOTE — Assessment & Plan Note (Signed)
Summary: np6/mumur  Medications Added FISH OIL   OIL (FISH OIL) 1 tab by mouth once daily        History of Present Illness: 65 yo female for evaluation of murmur. Previous Myoview was performed in May of 2006 and showed an ejection fraction of 68%. There was breast attenuation but no ischemia. She has been followed by Dr. Elsie Lincoln for hypertension, hyperlipidemia, and history of rheumatic fever. I have none of her most recent studies available for review. She does have dyspnea with more extreme activities but not with routine activities. It is relieved with rest. It is not associated with chest pain. Orthopnea, PND, pedal edema, palpitations or syncope. She does occasionally have a burning in her chest that sounds to be chronic. She attributes it to indigestion and is relieved with belching. It is not exertional. It does not radiate and there is no associated symptoms.  Current Medications (verified): 1)  Levothyroxine Sodium 100 Mcg Tabs (Levothyroxine Sodium) .Marland Kitchen.. 1 By Mouth Once Daily 2)  Amlodipine Besylate 5 Mg Tabs (Amlodipine Besylate) .Marland Kitchen.. 1 By Mouth Once Daily 3)  Ecotrin Low Strength 81 Mg  Tbec (Aspirin) .Marland Kitchen.. 1 By Mouth Qd 4)  Alendronate Sodium 70 Mg Tabs (Alendronate Sodium) .Marland Kitchen.. 1 By Mouth Q Wk 5)  Vitamin D 1000 Unit  Caps (Cholecalciferol) .Marland Kitchen.. 1 By Mouth Qd 6)  Hydrochlorothiazide 25 Mg Tabs (Hydrochlorothiazide) .Marland Kitchen.. 1 By Mouth Once Daily 7)  Hydrocodone-Acetaminophen 5-325 Mg Tabs (Hydrocodone-Acetaminophen) .Marland Kitchen.. 1 - 2 By Mouth Q 6 Hrs As Needed 8)  Fish Oil   Oil (Fish Oil) .Marland Kitchen.. 1 Tab By Mouth Once Daily  Allergies: No Known Drug Allergies  Past History:  Past Medical History: Reviewed history from 07/18/2009 and no changes required. Hyperlipidemia Colonic polyps, hx of Hypertension Allergic rhinitis DJD Hypothyroidism s/p tx for hyperthyrodism mult pulm nodules  - non-specific GERD ? hx of rheumatic fever Osteopenia Osteoarthritis c-spine disc dz  lumbar disc dz  Past Surgical History: Hysterectomy thyroidectomy Tonsillectomy s/p carpal tunnel and other right hand surgury  Family History: Reviewed history from 09/07/2007 and no changes required. aunt with arthritis, heart disease, HTN, DM No premature CAD in immediate family  Social History: Reviewed history from 09/07/2007 and no changes required. Current Smoker Alcohol use-no Divorced 2 children work - Training and development officer  Review of Systems       Problems with right knee pain and recent URI but no fevers or chills,  hemoptysis, dysphasia, odynophagia, melena, hematochezia, dysuria, hematuria, rash, seizure activity, orthopnea, PND, pedal edema, claudication. Remaining systems are negative.   Vital Signs:  Patient profile:   65 year old female Height:      62 inches Weight:      115 pounds BMI:     21.11 Pulse rate:   70 / minute Resp:     12 per minute BP sitting:   150 / 80  (left arm)  Vitals Entered By: Kem Parkinson (July 21, 2009 2:31 PM)  Physical Exam  General:  Well developed/well nourished in NAD Skin warm/dry Patient not depressed No peripheral clubbing Back-normal HEENT-normal/normal eyelids Neck supple/normal carotid upstroke bilaterally; no bruits; no JVD; no thyromegaly chest - CTA/ normal expansion CV - RRR/normal S1 and S2; no murmurs, rubs or gallops;  PMI nondisplaced Abdomen -NT/ND, no HSM, no mass, + bowel sounds, no bruit 2+ femoral pulses, no bruits Ext-no edema, chords, distal pulses are diminished Neuro-grossly nonfocal     EKG  Procedure date:  07/21/2009  Findings:  Sinus rhythm at a rate of 70. Poor R wave progression. Cannot rule out prior anterior infarct. Nonspecific ST changes.  Impression & Recommendations:  Problem # 1:  MURMUR (ICD-785.2) Ido not appreciate a murmur on examination. I will have her most recent echocardiogram sent to [place] from Dr. Truett Perna office.  Her updated medication list for this problem includes:    Hydrochlorothiazide 25 Mg Tabs (Hydrochlorothiazide) .Marland Kitchen... 1 by mouth once daily  Problem # 2:  HYPERLIPIDEMIA (ICD-272.4) Continue Fish oil. This is managed by her primary care.  Problem # 3:  HYPERTENSION (ICD-401.9) Her blood pressure is mildly elevated. I've asked her to check this at home and keep records. If her systolic is over 191 or her diastolic is over 85 and we will add additional medications as needed. Her updated medication list for this problem includes:    Amlodipine Besylate 5 Mg Tabs (Amlodipine besylate) .Marland Kitchen... 1 by mouth once daily    Ecotrin Low Strength 81 Mg Tbec (Aspirin) .Marland Kitchen... 1 by mouth qd    Hydrochlorothiazide 25 Mg Tabs (Hydrochlorothiazide) .Marland Kitchen... 1 by mouth once daily  Problem # 4:  CHEST PAIN (ICD-786.50) Her symptoms sound to be GI in etiology. I will have her most recent stress test forwarded to [place] from Dr. Truett Perna office. Her updated medication list for this problem includes:    Amlodipine Besylate 5 Mg Tabs (Amlodipine besylate) .Marland Kitchen... 1 by mouth once daily    Ecotrin Low Strength 81 Mg Tbec (Aspirin) .Marland Kitchen... 1 by mouth qd  Problem # 5:  GERD (ICD-530.81)  Problem # 6:  HYPOTHYROIDISM (ICD-244.9)  Her updated medication list for this problem includes:    Levothyroxine Sodium 100 Mcg Tabs (Levothyroxine sodium) .Marland Kitchen... 1 by mouth once daily  Problem # 7:  TOBACCO ABUSE (ICD-305.1) Patient counseled on discontinuing for between 3-10 minutes.  Patient Instructions: 1)  Your physician recommends that you schedule a follow-up appointment in: 12 months with Dr Jens Som

## 2010-09-22 NOTE — Assessment & Plan Note (Signed)
Summary: FU/ $50 /[place]   Vital Signs:  Patient profile:   65 year old female Height:      63 inches (160.02 cm) Weight:      118.0 pounds (53.64 kg) BMI:     20.98 O2 Sat:      92 % Temp:     98.6 degrees F (37.00 degrees C) oral Pulse rate:   75 / minute BP sitting:   130 / 82  (left arm) Cuff size:   regular  Vitals Entered By: Orlan Leavens (December 20, 2008 3:58 PM)  CC: follow-up visit Is Patient Diabetic? No Pain Assessment Patient in pain? no        CC:  follow-up visit.  History of Present Illness: fell to face 2 wks ago and seen at urgent care - tx with antibx for severe abrasions to face and now  healed it seems except for some mild swelilng ; also had some bruises to the left lat hip area now resolved per pt- just tripped on a walk outside a business; no LOC, CP, sob, doe, orthopnea, pnd or LE edema; no wheezing, palp or syncope  Problems Prior to Update: 1)  Preventive Health Care  (ICD-V70.0) 2)  Hypokalemia  (ICD-276.8) 3)  Uri  (ICD-465.9) 4)  Preventive Health Care  (ICD-V70.0) 5)  Arm Pain, Right  (ICD-729.5) 6)  Uri  (ICD-465.9) 7)  Uri  (ICD-465.9) 8)  Dizziness  (ICD-780.4) 9)  Hand Pain, Right  (ICD-729.5) 10)  Need Proph Vaccination&inoculat Oth Viral Dz  (ICD-V04.89) 11)  Abdominal Pain Right Upper Quadrant  (ICD-789.01) 12)  Chest Pain  (ICD-786.50) 13)  Osteoarthritis  (ICD-715.90) 14)  Hyperlipidemia  (ICD-272.4) 15)  Preventive Health Care  (ICD-V70.0) 16)  Osteopenia  (ICD-733.90) 17)  Gerd  (ICD-530.81) 18)  Hypothyroidism  (ICD-244.9) 19)  Allergic Rhinitis  (ICD-477.9) 20)  Hypertension  (ICD-401.9) 21)  Colonic Polyps, Hx of  (ICD-V12.72) 22)  Routine General Medical Exam@health  Care Fac[facility]  (ICD-V70.0)  Medications Prior to Update: 1)  Levothyroxine Sodium 88 Mcg Tabs (Levothyroxine Sodium) .Marland Kitchen.. 1po Once Daily 2)  Amlodipine Besylate 5 Mg Tabs (Amlodipine Besylate) .Marland Kitchen.. 1 By Mouth Once Daily  3)  Ecotrin Low Strength 81 Mg  Tbec (Aspirin) .Marland Kitchen.. 1 By Mouth Qd 4)  Actonel 150 Mg Tabs (Risedronate Sodium) .Marland Kitchen.. 1 By Mouth Q Mo 5)  Hydrochlorothiazide 25 Mg  Tabs (Hydrochlorothiazide) .Marland Kitchen.. 1 By Mouth Once Daily 6)  Vitamin D 1000 Unit  Caps (Cholecalciferol) .Marland Kitchen.. 1 By Mouth Qd 7)  Omeprazole 20 Mg  Cpdr (Omeprazole) .... 2 By Mouth Once Daily 8)  Cetirizine Hcl 10 Mg Tabs (Cetirizine Hcl) .Marland Kitchen.. 1 By Mouth Once Daily As Needed Allergy 9)  Klor-Con M10 10 Meq Cr-Tabs (Potassium Chloride Crys Cr) .Marland Kitchen.. 1po Once Daily  Current Medications (verified): 1)  Levothyroxine Sodium 88 Mcg Tabs (Levothyroxine Sodium) .Marland Kitchen.. 1po Once Daily 2)  Amlodipine Besylate 5 Mg Tabs (Amlodipine Besylate) .Marland Kitchen.. 1 By Mouth Once Daily 3)  Ecotrin Low Strength 81 Mg  Tbec (Aspirin) .Marland Kitchen.. 1 By Mouth Qd 4)  Alendronate Sodium 70 Mg Tabs (Alendronate Sodium) .Marland Kitchen.. 1 By Mouth Q Wk 5)  Hydrochlorothiazide 25 Mg  Tabs (Hydrochlorothiazide) .Marland Kitchen.. 1 By Mouth Once Daily 6)  Vitamin D 1000 Unit  Caps (Cholecalciferol) .Marland Kitchen.. 1 By Mouth Qd 7)  Omeprazole 20 Mg  Cpdr (Omeprazole) .... 2 By Mouth Once Daily As Needed 8)  Klor-Con 10 10 Meq Cr-Tabs (Potassium Chloride) .Marland Kitchen.. 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  Past Medical History:     Reviewed history from 09/07/2007 and no changes required:    Colonic polyps, hx of    Hypertension    Allergic rhinitis    DJD    Hypothyroidism    s/p tx for hyperthyrodism    mult pulm nodules  - non-specific    GERD    ? hx of rheumatic fever    Osteopenia    Hyperlipidemia    Osteoarthritis  Past Surgical History:    Reviewed history from 09/07/2007 and no changes required:    Hysterectomy    Tonsillectomy    s/p carpal tunnel and other right hand surgury  Family History:    Reviewed history from 09/07/2007 and no changes required:       aunt with arthritis, heart disease, HTN, DM  Social History:    Reviewed history from 09/07/2007 and no changes required:       Current Smoker       Alcohol use-no       Divorced       2 children       work - Training and development officer  Review of Systems  The patient denies anorexia, fever, weight loss, weight gain, vision loss, decreased hearing, hoarseness, chest pain, syncope, dyspnea on exertion, peripheral edema, prolonged cough, headaches, hemoptysis, abdominal pain, melena, hematochezia, severe indigestion/heartburn, hematuria, incontinence, muscle weakness, suspicious skin lesions, transient blindness, difficulty walking, depression, unusual weight change, abnormal bleeding, enlarged lymph nodes, angioedema, and breast masses.         all otherwise negative   Physical Exam  General:  alert and well-developed.   Head:  normocephalic and atraumatic.   Eyes:  vision grossly intact, pupils equal, and pupils round.   Ears:  R ear normal and L ear normal.   Nose:  no external deformity and no nasal discharge.   Mouth:  no gingival abnormalities and pharynx pink and moist.   Neck:  supple and no masses.   Lungs:  normal respiratory effort and normal breath sounds.   Heart:  normal rate, regular rhythm, and no murmur.   Abdomen:  soft, non-tender, and normal bowel sounds.   Msk:  no joint tenderness and no joint swelling.    Extremities:  no edema, no ulcers  Neurologic:  cranial nerves II-XII intact and strength normal in all extremities.   Skin:  mild upper lip diffuse swelling without erythema or tenderness   Impression & Recommendations:  Problem # 1:  Preventive Health Care (ICD-V70.0) Overall doing well, up to date, counseled on routine health concerns for screening and prevention, immunizations up to date  or declined, labs reviewed  Problem # 2:  OSTEOPENIA (ICD-733.90)  Her updated medication list for this problem includes:    Alendronate Sodium 70 Mg Tabs (Alendronate sodium) .Marland Kitchen... 1 by mouth q wk change to alendronate due to cost  Complete Medication List: 1)  Levothyroxine Sodium 88 Mcg Tabs (Levothyroxine sodium) .Marland Kitchen.. 1po once daily 2)  Amlodipine Besylate 5 Mg Tabs (Amlodipine besylate) .Marland Kitchen.. 1 by mouth once daily 3)  Ecotrin Low Strength 81 Mg Tbec (Aspirin) .Marland Kitchen.. 1 by mouth qd 4)  Alendronate Sodium 70 Mg Tabs (Alendronate sodium) .Marland Kitchen.. 1 by mouth q wk 5)  Hydrochlorothiazide 25 Mg Tabs (Hydrochlorothiazide) .Marland Kitchen.. 1 by mouth once daily 6)  Vitamin D 1000 Unit Caps (Cholecalciferol) .Marland Kitchen.. 1 by mouth qd 7)  Omeprazole 20 Mg Cpdr (Omeprazole) .... 2 by mouth once daily as needed 8)  Klor-con 10 10 Meq Cr-tabs (Potassium chloride) .Marland Kitchen.. 1 by mouth once daily  Patient Instructions: 1)  Continue all medications that you may have been taking previously  2)  Please schedule a follow-up appointment in 1 year or sooner if needed Prescriptions: KLOR-CON 10 10 MEQ CR-TABS (POTASSIUM CHLORIDE) 1 by mouth once daily  #90 x 3   Entered and Authorized by:   Corwin Levins MD   Signed by:   Corwin Levins MD on 12/20/2008   Method used:   Print then Give to Patient   RxID:   [id number] OMEPRAZOLE 20 MG  CPDR (OMEPRAZOLE) 2 by mouth once daily as needed  #60 x 11   Entered and Authorized by:   Corwin Levins MD   Signed by:   Corwin Levins MD on 12/20/2008   Method used:   Print then Give to Patient    RxID:   [id number] HYDROCHLOROTHIAZIDE 25 MG  TABS (HYDROCHLOROTHIAZIDE) 1 by mouth once daily  #100 x 3   Entered and Authorized by:   Corwin Levins MD   Signed by:   Corwin Levins MD on 12/20/2008   Method used:   Print then Give to Patient   RxID:   [id number] ALENDRONATE SODIUM 70 MG TABS (ALENDRONATE SODIUM) 1 by mouth q wk  #12 x 3   Entered and Authorized by:   Corwin Levins MD   Signed by:   Corwin Levins MD on 12/20/2008   Method used:   Print then Give to Patient   RxID:   [id number] AMLODIPINE BESYLATE 5 MG TABS (AMLODIPINE BESYLATE) 1 by mouth once daily  #90 x 3   Entered and Authorized by:   Corwin Levins MD   Signed by:   Corwin Levins MD on 12/20/2008   Method used:   Print then Give to Patient   RxID:   [id number] LEVOTHYROXINE SODIUM 88 MCG TABS (LEVOTHYROXINE SODIUM) 1po once daily  #90 x 3   Entered and Authorized by:   Corwin Levins MD   Signed by:   Corwin Levins MD on 12/20/2008   Method used:   Print then Give to Patient   RxID:   [id number]

## 2010-09-22 NOTE — Consult Note (Signed)
Summary: R hand pa & swelling/[facility]  R hand pa & swelling/[facility]   Imported By: Lester Townsend 08/29/2008 09:24:37  _____________________________________________________________________  External Attachment:    Type:   Image     Comment:   External Document

## 2010-09-22 NOTE — Assessment & Plan Note (Signed)
Summary: Gatha Mayer Natale Milch  Nurse Visit    Prior Medications: LEVOXYL 88 MCG  TABS (LEVOTHYROXINE SODIUM) 1 by mouth qd NORVASC 5 MG  TABS (AMLODIPINE BESYLATE) 1 by mouth qd ECOTRIN LOW STRENGTH 81 MG  TBEC (ASPIRIN) 1 by mouth qd ACTONEL 35 MG  TABS (RISEDRONATE SODIUM) 1 by mouth q wk HYDROCHLOROTHIAZIDE 25 MG  TABS (HYDROCHLOROTHIAZIDE) 1 by mouth qd VITAMIN D 1000 UNIT  CAPS (CHOLECALCIFEROL) 1 by mouth qd OMEPRAZOLE 20 MG  CPDR (OMEPRAZOLE) 2 by mouth qd LEVOTHYROXINE SODIUM 25 MCG TABS (LEVOTHYROXINE SODIUM) Take 1 tablet by mouth once a day CEFTIN 250 MG  TABS (CEFUROXIME AXETIL) 1 by mouth bid PROMETHAZINE-CODEINE 6.25-10 MG/5ML  SYRP (PROMETHAZINE-CODEINE) 1 tsp by mouth qid prn Current Allergies: No known allergies    Zostavax # 1    Vaccine Type: Zostavax    Site: left deltoid    Mfr: Merck    Dose: 0.65    Route: [place]    Given by: Rock Nephew CMA    Exp. Date: 03/28/2009    Lot #: [id number]    VIS given: 06/04/05 given December 12, 2007.   Orders Added: 1)  Zoster (Shingles) Vaccine Live [90736] 2)  Admin 1st Vaccine [email or web address]    ]

## 2010-09-22 NOTE — Letter (Signed)
Summary: Handicapped Placard/N C DMV  Handicapped Placard/N C DMV   Imported By: Lester Bentley 09/09/2008 11:53:26  _____________________________________________________________________  External Attachment:    Type:   Image     Comment:   External Document

## 2010-09-22 NOTE — Assessment & Plan Note (Signed)
Summary: 3 mth fu---$50---stc   Vital Signs:  Patient Profile:   65 Years Old Female Weight:      115 pounds O2 Sat:      99 % O2 treatment:    Room Air Temp:     97.6 degrees F oral Pulse rate:   87 / minute BP sitting:   124 / 82  (left arm) Cuff size:   regular  Vitals Entered By: Payton Spark CMA (August 21, 2008 9:13 AM)                  Chief Complaint:  3 MO F/U.  History of Present Illness: has appt coming up at [facility] neurology Sep 19, 2008; recentloy also got letter from [employer] she wants me to read - they recommend getting c-spine eval prior to any work at [employer] for the wrist to r/o cervical problems ; also some mild URi symptoms incidental without ST, wheezing, sob or doe; no CP; has had some occasional cough    Updated Prior Medication List: LEVOXYL 88 MCG  TABS (LEVOTHYROXINE SODIUM) 1 by mouth qd NORVASC 5 MG  TABS (AMLODIPINE BESYLATE) 1 by mouth qd ECOTRIN LOW STRENGTH 81 MG  TBEC (ASPIRIN) 1 by mouth qd ACTONEL 35 MG  TABS (RISEDRONATE SODIUM) 1 by mouth q wk HYDROCHLOROTHIAZIDE 25 MG  TABS (HYDROCHLOROTHIAZIDE) 1 by mouth qd VITAMIN D 1000 UNIT  CAPS (CHOLECALCIFEROL) 1 by mouth qd OMEPRAZOLE 20 MG  CPDR (OMEPRAZOLE) 2 by mouth qd CETIRIZINE HCL 10 MG TABS (CETIRIZINE HCL) 1 by mouth once daily as needed allergy  Current Allergies (reviewed today): No known allergies   Past Medical History:    Reviewed history from 09/07/2007 and no changes required:       Colonic polyps, hx of       Hypertension       Allergic rhinitis       DJD       Hypothyroidism       s/p tx for hyperthyrodism       mult pulm nodules  - non-specific       GERD       ? hx of rheumatic fever       Osteopenia       Hyperlipidemia       Osteoarthritis  Past Surgical History:    Reviewed history from 09/07/2007 and no changes required:       Hysterectomy       Tonsillectomy       s/p carpal tunnel and other right hand surgury   Social History:     Reviewed history from 09/07/2007 and no changes required:       Current Smoker       Alcohol use-no       Divorced       2 children       work - Training and development officer    Review of Systems       all otherwise negative    Physical Exam  General:     alert and well-developed.   Head:     Normocephalic and atraumatic without obvious abnormalities. No apparent alopecia or balding. Eyes:     No corneal or conjunctival inflammation noted. EOMI. Perrla. F Ears:     External ear exam shows no significant lesions or deformities.  Otoscopic examination reveals clear canals, tympanic membranes are intact bilaterally without bulging, retraction, inflammation or discharge. Hearing is grossly normal bilaterally. Nose:     External nasal examination  shows no deformity or inflammation. Nasal mucosa are pink and moist without lesions or exudates. Mouth:     pharyngeal erythema and fair dentition.   Neck:     No deformities, masses, or tenderness noted. Lungs:     Normal respiratory effort, chest expands symmetrically. Lungs are clear to auscultation, no crackles or wheezes. Heart:     Normal rate and regular rhythm. S1 and S2 normal without gallop, murmur, click, rub or other extra sounds. Extremities:     no edema, no ulcers     Impression & Recommendations:  Problem # 1:  URI (ICD-465.9)  Her updated medication list for this problem includes:    Ecotrin Low Strength 81 Mg Tbec (Aspirin) .Marland Kitchen... 1 by mouth qd    Cetirizine Hcl 10 Mg Tabs (Cetirizine hcl) .Marland Kitchen... 1 by mouth once daily as needed allergy incidental - c/w viral, ok to cont tx as above, follow as needed  Problem # 2:  ARM PAIN, RIGHT (ICD-729.5) refer ortho - [facility] to r/ c-spine dz as rec'd per [facility]  Orders: Orthopedic Referral (Ortho)   Complete Medication List: 1)  Levoxyl 88 Mcg Tabs (Levothyroxine sodium) .Marland Kitchen.. 1 by mouth qd 2)  Norvasc 5 Mg Tabs (Amlodipine besylate) .Marland Kitchen.. 1 by mouth qd  3)  Ecotrin Low Strength 81 Mg Tbec (Aspirin) .Marland Kitchen.. 1 by mouth qd 4)  Actonel 35 Mg Tabs (Risedronate sodium) .Marland Kitchen.. 1 by mouth q wk 5)  Hydrochlorothiazide 25 Mg Tabs (Hydrochlorothiazide) .Marland Kitchen.. 1 by mouth qd 6)  Vitamin D 1000 Unit Caps (Cholecalciferol) .Marland Kitchen.. 1 by mouth qd 7)  Omeprazole 20 Mg Cpdr (Omeprazole) .... 2 by mouth qd 8)  Cetirizine Hcl 10 Mg Tabs (Cetirizine hcl) .Marland Kitchen.. 1 by mouth once daily as needed allergy   Patient Instructions: 1)  You will be contacted about the referral(s) to: orthopedic for the arm and neck evaluation 2)  Continue all medications that you may have been taking previously 3)  Please schedule a follow-up appointment in 1 month with CPX labs   ]

## 2010-09-22 NOTE — Miscellaneous (Signed)
Summary: BONE DENSITY  Clinical Lists Changes  Orders: Added new Test order of T-Lumbar Vertebral Assessment (77082) - Signed Added new Test order of T-Bone Densitometry (77080) - Signed 

## 2010-09-22 NOTE — Letter (Signed)
Summary: R upper extremity/[facility]  R upper [facility]   Imported By: Lester [place] 09/11/2008 11:14:44  _____________________________________________________________________  External Attachment:    Type:   Image     Comment:   External Document

## 2010-09-22 NOTE — Progress Notes (Signed)
Summary: BD test  Phone Note Call from Patient Call back at Home Phone [phone]   Summary of Call: Patient left message on triage that she was informed that she needs bone density testing. Please advise. Initial call taken by: Lucious Groves,  November 10, 2009 9:34 AM  Follow-up for Phone Call        she was due feb 2010 and ordered, but some reason i think not done  ok to re-order - done hardcopy to LIM side B - dahlia  Follow-up by: Corwin Levins MD,  November 10, 2009 1:19 PM  Additional Follow-up for Phone Call Additional follow up Details #1::        Harriett Sine called patient to schedule appt. Additional Follow-up by: Lucious Groves,  November 10, 2009 2:39 PM

## 2010-09-22 NOTE — Letter (Signed)
Summary: [facility] Office Note  [facility] Office Note   Imported By: Roderic Ovens 09/18/2009 11:49:56  _____________________________________________________________________  External Attachment:    Type:   Image     Comment:   External Document

## 2010-09-22 NOTE — Letter (Signed)
Summary: [facility]  [facility]   Imported By: Roderic Ovens 09/18/2009 12:01:25  _____________________________________________________________________  External Attachment:    Type:   Image     Comment:   External Document

## 2010-09-24 NOTE — Assessment & Plan Note (Signed)
Summary: yearly/sl  Medications Added LEVOTHROID 88 MCG TABS (LEVOTHYROXINE SODIUM) 1 tab by mouth once daily MULTIVITAMINS   TABS (MULTIPLE VITAMIN) 1 tab by mouth once daily PRAVASTATIN SODIUM 40 MG TABS (PRAVASTATIN SODIUM) Take one tablet by mouth daily at bedtime        CC:  yearly visit.  History of Present Illness: Pleasant female I saw in Nov 2010 for evaluation of murmur. She had been followed by Dr. Elsie Lincoln for hypertension, hyperlipidemia, and history of rheumatic fever. Cardiac catheterization in 2004 showed a 50% proximal LAD, a 40% diagonal and to 75% lesions in the right coronary artery. Her ejection fraction was 50-55%. The renal arteries were normal. There was mild disease in the iliacs bilaterally. Echocardiogram in 2008 at [facility] showed normal LV function and no significant valve disease. Nuclear study in 2008 showed an ejection fraction of 66%, breast attenuation but no ischemia. Since I last saw her, the patient has dyspnea with more extreme activities but not with routine activities. It is relieved with rest. It is not associated with chest pain. There is no orthopnea, PND or pedal edema. There is no syncope or palpitations. There is no exertional chest pain.   Current Medications (verified): 1)  Levothroid 88 Mcg Tabs (Levothyroxine Sodium) .Marland Kitchen.. 1 Tab By Mouth Once Daily 2)  Amlodipine Besylate 5 Mg Tabs (Amlodipine Besylate) .Marland Kitchen.. 1 By Mouth Once Daily 3)  Ecotrin Low Strength 81 Mg  Tbec (Aspirin) .Marland Kitchen.. 1 By Mouth Qd 4)  Vitamin D 1000 Unit  Caps (Cholecalciferol) .Marland Kitchen.. 1 By Mouth Qd 5)  Hydrochlorothiazide 25 Mg Tabs (Hydrochlorothiazide) .Marland Kitchen.. 1 By Mouth Once Daily 6)  Hydrocodone-Acetaminophen 5-325 Mg Tabs (Hydrocodone-Acetaminophen) .Marland Kitchen.. 1 - 2 By Mouth Q 6 Hrs As Needed 7)  Fish Oil   Oil (Fish Oil) .Marland Kitchen.. 1 Tab By Mouth Once Daily 8)  Multivitamins   Tabs (Multiple Vitamin) .Marland Kitchen.. 1 Tab By Mouth Once Daily  Allergies: No Known Drug Allergies  Past  History:  Past Medical History: Reviewed history from 07/27/2010 and no changes required. Hyperlipidemia Colonic polyps, hx of Hypertension Allergic rhinitis DJD Hypothyroidism s/p tx for hyperthyrodism mult pulm nodules  - non-specific GERD ? hx of rheumatic fever Osteopenia Osteoarthritis c-spine disc dz lumbar disc dz  Past Surgical History: Reviewed history from 07/21/2009 and no changes required. Hysterectomy thyroidectomy Tonsillectomy s/p carpal tunnel and other right hand surgury  Social History: Reviewed history from 02/19/2010 and no changes required. Current Smoker Alcohol use-no Divorced 2 children work - Training and development officer Drug use-no  Review of Systems       Recent cold symptoms but no fevers or chills, productive cough, hemoptysis, dysphasia, odynophagia, melena, hematochezia, dysuria, hematuria, rash, seizure activity, orthopnea, PND, pedal edema, claudication. Remaining systems are negative.   Vital Signs:  Patient profile:   65 year old female Height:      63 inches Weight:      115 pounds BMI:     20.44 Pulse rate:   86 / minute Resp:     14 per minute BP sitting:   124 / 80  (left arm)  Vitals Entered By: Kem Parkinson (September 08, 2010 8:09 AM)  Physical Exam  General:  Well-developed well-nourished in no acute distress.  Skin is warm and dry.  HEENT is normal.  Neck is supple. No thyromegaly.  Chest is clear to auscultation with normal expansion.  Cardiovascular exam is regular rate and rhythm.  Abdominal exam nontender or distended. No masses palpated. Extremities show no edema.  neuro grossly intact    EKG  Procedure date:  07/28/2011  Findings:      Sinus rhythm with occasional PVC. Poor R-wave progression. Nonspecific ST changes.  EKG  Procedure date:  09/08/2010  Findings:      Sinus rhythm with occasional PVC. Poor R-wave progression. Nonspecific ST changes.  Impression & Recommendations:  Problem # 1:  CAD  (ICD-414.00) Plan continue aspirin. She takes Zocor intermittently. Discontinue that medication given Norvasc use. Begin Pravachol 40 mg p.o. daily. Check lipids and liver in 6 weeks. Schedule Myoview for risk stratification. Her updated medication list for this problem includes:    Amlodipine Besylate 5 Mg Tabs (Amlodipine besylate) .Marland Kitchen... 1 by mouth once daily    Ecotrin Low Strength 81 Mg Tbec (Aspirin) .Marland Kitchen... 1 by mouth qd  Problem # 2:  TOBACCO ABUSE (ICD-305.1) Patient counseled on discontinuing.  Problem # 3:  HYPERLIPIDEMIA (ICD-272.4) Begin Pravachol as described above. Her updated medication list for this problem includes:    Pravastatin Sodium 40 Mg Tabs (Pravastatin sodium) .Marland Kitchen... Take one tablet by mouth daily at bedtime  Her updated medication list for this problem includes:    Pravastatin Sodium 40 Mg Tabs (Pravastatin sodium) .Marland Kitchen... Take one tablet by mouth daily at bedtime  Problem # 4:  HYPERTENSION (ICD-401.9) Blood pressure controlled on present medications. Will continue. Her updated medication list for this problem includes:    Amlodipine Besylate 5 Mg Tabs (Amlodipine besylate) .Marland Kitchen... 1 by mouth once daily    Ecotrin Low Strength 81 Mg Tbec (Aspirin) .Marland Kitchen... 1 by mouth qd    Hydrochlorothiazide 25 Mg Tabs (Hydrochlorothiazide) .Marland Kitchen... 1 by mouth once daily  Problem # 5:  HYPOTHYROIDISM (ICD-244.9)  Her updated medication list for this problem includes:    Levothroid 88 Mcg Tabs (Levothyroxine sodium) .Marland Kitchen... 1 tab by mouth once daily  Other Orders: Nuclear Stress Test (Nuc Stress Test)  Patient Instructions: 1)  Your physician recommends that you return for lab work in: 2)  Your physician has recommended you make the following change in your medication:  3)  Your physician wants you to follow-up in:   You will receive a reminder letter in the mail two months in advance. If you don't receive a letter, please call our office to schedule the follow-up appointment. 4)   Your physician has requested that you have an exercise stress myoview.  For further information please visit [email or web address]  Please follow instruction sheet, as given. Prescriptions: PRAVASTATIN SODIUM 40 MG TABS (PRAVASTATIN SODIUM) Take one tablet by mouth daily at bedtime  #30 x 12   Entered by:   Deliah Goody, RN   Authorized by:   Ferman Hamming, MD, [facility]   Signed by:   Deliah Goody, RN on 09/08/2010   Method used:   Electronically to        [facility] [phone]* (retail)       [address]       [place], [place]  [address]       Ph: [phone]       Fax: [phone]   RxID:   [id number]

## 2010-10-19 ENCOUNTER — Encounter (HOSPITAL_COMMUNITY): Payer: Self-pay

## 2010-10-19 ENCOUNTER — Other Ambulatory Visit: Payer: Self-pay

## 2010-10-28 ENCOUNTER — Encounter (HOSPITAL_COMMUNITY): Payer: Self-pay

## 2010-11-02 ENCOUNTER — Telehealth (HOSPITAL_COMMUNITY): Payer: Self-pay | Admitting: Radiology

## 2010-11-03 ENCOUNTER — Telehealth: Payer: Self-pay | Admitting: Cardiovascular Disease

## 2010-11-03 ENCOUNTER — Encounter: Payer: Self-pay | Admitting: Cardiovascular Disease

## 2010-11-03 ENCOUNTER — Ambulatory Visit (HOSPITAL_COMMUNITY): Payer: Medicare Other | Attending: Cardiology

## 2010-11-03 ENCOUNTER — Encounter: Payer: Self-pay | Admitting: Cardiology

## 2010-11-03 ENCOUNTER — Ambulatory Visit (INDEPENDENT_AMBULATORY_CARE_PROVIDER_SITE_OTHER): Payer: Medicare Other | Admitting: Cardiovascular Disease

## 2010-11-03 DIAGNOSIS — R079 Chest pain, unspecified: Secondary | ICD-10-CM | POA: Insufficient documentation

## 2010-11-03 DIAGNOSIS — R0602 Shortness of breath: Secondary | ICD-10-CM

## 2010-11-03 DIAGNOSIS — I2510 Atherosclerotic heart disease of native coronary artery without angina pectoris: Secondary | ICD-10-CM

## 2010-11-09 ENCOUNTER — Other Ambulatory Visit: Payer: Self-pay | Admitting: Cardiovascular Disease

## 2010-11-09 ENCOUNTER — Other Ambulatory Visit (INDEPENDENT_AMBULATORY_CARE_PROVIDER_SITE_OTHER): Payer: Medicare Other

## 2010-11-09 ENCOUNTER — Encounter: Payer: Self-pay | Admitting: Cardiovascular Disease

## 2010-11-09 DIAGNOSIS — R0602 Shortness of breath: Secondary | ICD-10-CM

## 2010-11-09 DIAGNOSIS — Z01810 Encounter for preprocedural cardiovascular examination: Secondary | ICD-10-CM

## 2010-11-09 DIAGNOSIS — I2510 Atherosclerotic heart disease of native coronary artery without angina pectoris: Secondary | ICD-10-CM

## 2010-11-09 LAB — BASIC METABOLIC PANEL
BUN: 14 mg/dL (ref 6–23)
CO2: 29 mEq/L (ref 19–32)
Calcium: 8.9 mg/dL (ref 8.4–10.5)
Chloride: 103 mEq/L (ref 96–112)
Creatinine, Ser: 0.8 mg/dL (ref 0.4–1.2)
GFR: 93.52 mL/min (ref >60.00)
Glucose, Bld: 95 mg/dL (ref 70–99)
Potassium: 3.2 mEq/L — ABNORMAL LOW (ref 3.5–5.1)
Sodium: 142 mEq/L (ref 135–145)

## 2010-11-09 LAB — CBC WITH DIFFERENTIAL/PLATELET
Basophils Absolute: 0 10*3/uL (ref 0.0–0.1)
Basophils Relative: 0.4 % (ref 0.0–3.0)
Eosinophils Absolute: 0.2 10*3/uL (ref 0.0–0.7)
Eosinophils Relative: 2.7 % (ref 0.0–5.0)
HCT: 42.7 % (ref 36.0–46.0)
Hemoglobin: 15 g/dL (ref 12.0–15.0)
Lymphocytes Relative: 26.2 % (ref 12.0–46.0)
Lymphs Abs: 1.6 10*3/uL (ref 0.7–4.0)
MCHC: 35 g/dL (ref 30.0–36.0)
MCV: 92.4 fl (ref 78.0–100.0)
Monocytes Absolute: 0.4 10*3/uL (ref 0.1–1.0)
Monocytes Relative: 6.7 % (ref 3.0–12.0)
Neutro Abs: 3.9 10*3/uL (ref 1.4–7.7)
Neutrophils Relative %: 64 % (ref 43.0–77.0)
Platelets: 211 10*3/uL (ref 150.0–400.0)
RBC: 4.62 Mil/uL (ref 3.87–5.11)
RDW: 15.1 % — ABNORMAL HIGH (ref 11.5–14.6)
WBC: 6 10*3/uL (ref 4.5–10.5)

## 2010-11-09 LAB — APTT: aPTT: 25 s (ref 21.7–28.8)

## 2010-11-09 LAB — PROTIME-INR
INR: 0.9 ratio (ref 0.8–1.0)
Prothrombin Time: 10.3 s (ref 10.2–12.4)

## 2010-11-10 ENCOUNTER — Other Ambulatory Visit: Payer: Medicare Other

## 2010-11-10 ENCOUNTER — Telehealth: Payer: Self-pay | Admitting: Cardiology

## 2010-11-10 NOTE — Progress Notes (Signed)
Summary: Nuc Pre-Procedure  Phone Note Outgoing Call Call back at Home Phone [phone]   Call placed by: Henrine Screws Call placed to: Patient Reason for Call: Confirm/change Appt Summary of Call: Reviewed information on Myoview Information Sheet (see scanned document for further details).  Spoke with patient.     Nuclear Med Background Indications for Stress Test: Evaluation for Ischemia   History: Echo, Heart Catheterization, Myocardial Perfusion Study  History Comments: 2004- Cath- 75%RCA;50%LAD;EF= 55% 2008- MPS- Normal. EF= 66%  Symptoms: DOE    Nuclear Pre-Procedure Cardiac Risk Factors: Hypertension, Lipids, Smoker Height (in): 63

## 2010-11-10 NOTE — Assessment & Plan Note (Signed)
Summary: ROV/D.MILLER      Allergies Added: NKDA  Visit Type:  Follow-up  CC:  Dyspnea.  History of Present Illness: 65 yo AAF with history of CAD, HTN, hyperlipidemia who has had recent complaints of dyspnea with exertion. She is followed by Dr. Jens Som. Lexiscan myoview arranged for today. Myoview with evidence of anteroapical ischemia. Cardiac catheterization in 2004 showed a 50% proximal LAD, a 40% diagonal and to 75% lesions in the right coronary artery. Her ejection fraction was 50-55%. The renal arteries were normal.She is added onto my schedule for evaluation.   She tells me that she had had increased stress in her life over the last month with deaths of friends and family. She denies any chest pain. She continues to have SOB. No dizziness, near syncope or syncope.      Current Medications (verified): 1)  Levothroid 88 Mcg Tabs (Levothyroxine Sodium) .Marland Kitchen.. 1 Tab By Mouth Once Daily 2)  Amlodipine Besylate 5 Mg Tabs (Amlodipine Besylate) .Marland Kitchen.. 1 By Mouth Once Daily 3)  Ecotrin Low Strength 81 Mg  Tbec (Aspirin) .Marland Kitchen.. 1 By Mouth Qd 4)  Vitamin D 1000 Unit  Caps (Cholecalciferol) .Marland Kitchen.. 1 By Mouth Qd 5)  Hydrochlorothiazide 25 Mg Tabs (Hydrochlorothiazide) .Marland Kitchen.. 1 By Mouth Once Daily 6)  Hydrocodone-Acetaminophen 5-325 Mg Tabs (Hydrocodone-Acetaminophen) .Marland Kitchen.. 1 - 2 By Mouth Q 6 Hrs As Needed 7)  Fish Oil   Oil (Fish Oil) .Marland Kitchen.. 1 Tab By Mouth Once Daily 8)  Multivitamins   Tabs (Multiple Vitamin) .Marland Kitchen.. 1 Tab By Mouth Once Daily 9)  Pravastatin Sodium 40 Mg Tabs (Pravastatin Sodium) .... Take One Tablet By Mouth Daily At Bedtime  Allergies (verified): No Known Drug Allergies  Past History:  Past Medical History: Last updated: 07/27/2010 Hyperlipidemia Colonic polyps, hx of Hypertension Allergic rhinitis DJD Hypothyroidism s/p tx for hyperthyrodism mult pulm nodules  - non-specific GERD ? hx of rheumatic fever Osteopenia Osteoarthritis c-spine disc dz lumbar disc  dz  Past Surgical History: Hysterectomy thyroidectomy Tonsillectomy s/p carpal tunnel and other right hand surgery  Family History: Reviewed history from 07/21/2009 and no changes required. aunt with arthritis, heart disease, HTN, DM No premature CAD in immediate family  Social History: Reviewed history from 02/19/2010 and no changes required. 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  The patient denies fatigue, malaise, fever, weight gain/loss, vision loss, decreased hearing, hoarseness, chest pain, palpitations, prolonged cough, wheezing, sleep apnea, coughing up blood, abdominal pain, blood in stool, nausea, vomiting, diarrhea, heartburn, incontinence, blood in urine, muscle weakness, joint pain, leg swelling, rash, skin lesions, headache, fainting, dizziness, depression, anxiety, enlarged lymph nodes, easy bruising or bleeding, and environmental allergies.    Vital Signs:  Patient profile:   65 year old female Height:      63 inches Weight:      111 pounds BMI:     19.73 Pulse rate:   75 / minute Resp:     16 per minute BP sitting:   155 / 87  (right arm)  Vitals Entered By: Marrion Coy, CNA (November 03, 2010 10:53 AM)  Physical Exam  General:  General: Well developed, well nourished, NAD HEENT: OP clear, mucus membranes moist SKIN: warm, dry Neuro: No focal deficits Musculoskeletal: Muscle strength 5/5 all ext Psychiatric: Mood and affect normal Neck: No JVD, no carotid bruits, no thyromegaly, no lymphadenopathy. Lungs:Clear bilaterally, no wheezes,  rhonci, crackles CV: RRR no murmurs, gallops rubs Abdomen: soft, NT, ND, BS present Extremities: No edema, pulses 2+.    EKG  Procedure date:  11/03/2010  Findings:      NSR, rate 80 bpm. Poor R wave progression.   Nuclear Study  Procedure date:  11/03/2010  Findings:      Apical scar with  peri-infarct ischemia.   Impression & Recommendations:  Problem # 1:  CAD (ICD-414.00) Recent dyspnea with ischemia on myoview. I have discussed a cardiac cath with the patient. She wishes to proceed but will call back to arrange a date and time. I have given her a list of all of the dates that I am in the cath lab. Risks and benefits reviewed with patient. Will need labs (BMET, CBC and coags) the week of procedure.   Her updated medication list for this problem includes:    Amlodipine Besylate 5 Mg Tabs (Amlodipine besylate) .Marland Kitchen... 1 by mouth once daily    Ecotrin Low Strength 81 Mg Tbec (Aspirin) .Marland Kitchen... 1 by mouth qd  Patient Instructions: 1)  Your physician recommends that you schedule a follow-up appointment. Patient will call to schedule catheterization. 2)  Your physician recommends that you continue on your current medications as directed. Please refer to the Current Medication list given to you today.

## 2010-11-10 NOTE — Letter (Signed)
Summary: Cardiac Catheterization Instructions- [facility]  [facility], Main Office  [address]   [place], [place] [address]   Phone: [phone]  Fax: [phone]     11/03/2010 MRN: [id number]  [facility] [address] [place], [place]  [address]  [place]  Dear [name],  BLOOD WORK ON 11/10/10 any time after 8:30am. You are scheduled for a Cardiac Catheterization on 11/12/10 with Dr.Dontasia Miranda. Please arrive to the 1st floor of the Heart and Vascular Center at [facility] at 7:30 am on the day of your procedure. Please do not arrive before 6:30 a.m. Call the Heart and Vascular Center at [phone] if you are unable to make your appointmnet. The Code to get into the parking garage under the building is 3000. Take the elevators to the 1st floor. You must have someone to drive you home. Someone must be with you for the first 24 hours after you arrive home. Please wear clothes that are easy to get on and off and wear slip-on shoes. Do not eat or drink after midnight except water with your medications that morning. Bring all your medications and current insurance cards with you.  _ __x_ Make sure you take your aspirin.  __x_ You may take ALL of your medications with water that morning. ________________________________________________________________________________________________________________________________    The usual length of stay after your procedure is 2 to 3 hours. This can vary.  If you have any questions, please call the office at the number listed above.   Alicia Maeola Sarah RN

## 2010-11-10 NOTE — Assessment & Plan Note (Signed)
Summary: Cardiology Nuclear Testing  Nuclear Med Background Indications for Stress Test: Evaluation for Ischemia   History: Echo, Heart Catheterization, Myocardial Perfusion Study  History Comments: 2004- Cath- 75%RCA;50%LAD;EF= 55% 2008- MPS- Normal. EF= 66%  Symptoms: DOE, Fatigue, Palpitations, SOB    Nuclear Pre-Procedure Cardiac Risk Factors: Hypertension, Lipids, Smoker Caffeine/Decaff Intake: None NPO After: 7:00 PM Lungs: clear IV 0.9% NS with Angio Cath: 24g     IV Site: R Hand IV Started by: Irean Hong, RN Chest Size (in) 36     Cup Size C     Height (in): 63 Weight (lb): 111 BMI: 19.73  Nuclear Med Study 1 or 2 day study:  1 day     Stress Test Type:  Eugenie Birks Reading MD:  Willa Rough, MD     Referring MD:  B.Crenshaw Resting Radionuclide:  Technetium 83m Tetrofosmin     Resting Radionuclide Dose:  10.8 mCi  Stress Radionuclide:  Technetium 48m Tetrofosmin     Stress Radionuclide Dose:  33.0 mCi   Stress Protocol  Max Systolic BP: 157 mm Hg Lexiscan: 0.4 mg   Stress Test Technologist:  Milana Na, EMT-P     Nuclear Technologist:  Doyne Keel, CNMT  Rest Procedure  Myocardial perfusion imaging was performed at rest 45 minutes following the intravenous administration of Technetium 40m Tetrofosmin.  Stress Procedure  The patient received IV Lexiscan 0.4 mg over 15-seconds.  Technetium 97m Tetrofosmin injected at 30-seconds.  There were no significant changes and freq pvcs with infusion.  Quantitative spect images were obtained after a 45 minute delay.  QPS Raw Data Images:  Normal; no motion artifact; normal heart/lung ratio. Stress Images:  marked decrease in activity in the antero-apical wall. Rest Images:  mild decrease activity antero-apical wall. Subtraction (SDS):  Anterior ischemia Transient Ischemic Dilatation:  1.08  (Normal <1.22)  Lung/Heart Ratio:  0.24  (Normal <0.45)  Quantitative Gated Spect Images QGS cine images:  Not  Gated   Overall Impression  Exercise Capacity: Lexiscan with no exercise. BP Response: Normal blood pressure response. Clinical Symptoms: abdominal discomfort ECG Impression: No significant ST segment change suggestive of ischemia. Overall Impression Comments: There is anterior ischemia. The study is not gated. Cath is being arranged

## 2010-11-10 NOTE — Assessment & Plan Note (Signed)
Summary: R/S Myoview,wt 115/dx 786.50/SL/PE/SEC HOR/[employer], HYQM#[id number]...  Nuclear Med Background Indications for Stress Test: Evaluation for Ischemia   History: Echo, Heart Catheterization, Myocardial Perfusion Study  History Comments: 2004- Cath- 75%RCA;50%LAD;EF= 55% 2008- MPS- Normal. EF= 66%  Symptoms: DOE    Nuclear Pre-Procedure Cardiac Risk Factors: Hypertension, Lipids, Smoker Height (in): 63

## 2010-11-10 NOTE — Progress Notes (Signed)
Summary: rtn call to whitney   Phone Note Call from Patient   Caller: [phone] Summary of Call: rtn call to whitney Initial call taken by: Glynda Jaeger,  November 03, 2010 2:03 PM  Follow-up for Phone Call        Patient wanted to schedule cardiac cath. with Dr. Clifton James. She is sch. for 11/12/10 @ 0830. I will mail her information. She will come for blood work on 11/10/10.  Whitney Maeola Sarah RN  November 03, 2010 4:14 PM  Follow-up by: Whitney Maeola Sarah RN,  November 03, 2010 4:14 PM

## 2010-11-11 ENCOUNTER — Telehealth: Payer: Self-pay | Admitting: Cardiovascular Disease

## 2010-11-11 DIAGNOSIS — E876 Hypokalemia: Secondary | ICD-10-CM

## 2010-11-11 MED ORDER — POTASSIUM CHLORIDE CRYS ER 20 MEQ PO TBCR: 20.0000 meq | EXTENDED_RELEASE_TABLET | Freq: Every day | ORAL | Status: DC

## 2010-11-11 NOTE — Telephone Encounter (Signed)
Patient is aware of test/lab results. Followed over from phone note in EMR. Patient to start potassium by mouth daily. She will take today and tomorrow.

## 2010-11-11 NOTE — Telephone Encounter (Signed)
LVMTCB

## 2010-11-12 ENCOUNTER — Inpatient Hospital Stay (HOSPITAL_COMMUNITY)
Admission: AD | Admit: 2010-11-12 | Discharge: 2010-11-19 | DRG: 236 | Disposition: A | Discharge status: Home Health | Payer: Medicare Other | Source: Ambulatory Visit | Attending: Thoracic Surgery (Cardiothoracic Vascular Surgery) | Admitting: Thoracic Surgery (Cardiothoracic Vascular Surgery)

## 2010-11-12 ENCOUNTER — Inpatient Hospital Stay (HOSPITAL_BASED_OUTPATIENT_CLINIC_OR_DEPARTMENT_OTHER)
Admission: RE | Admit: 2010-11-12 | Discharge: 2010-11-12 | Disposition: A | Discharge status: Hospice | Payer: Medicare Other | Source: Ambulatory Visit | Attending: Cardiovascular Disease | Admitting: Cardiovascular Disease

## 2010-11-12 ENCOUNTER — Inpatient Hospital Stay (HOSPITAL_COMMUNITY): Payer: Medicare Other

## 2010-11-12 DIAGNOSIS — E039 Hypothyroidism, unspecified: Secondary | ICD-10-CM | POA: Diagnosis present

## 2010-11-12 DIAGNOSIS — M899 Disorder of bone, unspecified: Secondary | ICD-10-CM | POA: Diagnosis present

## 2010-11-12 DIAGNOSIS — Z01810 Encounter for preprocedural cardiovascular examination: Secondary | ICD-10-CM

## 2010-11-12 DIAGNOSIS — J4489 Other specified chronic obstructive pulmonary disease: Secondary | ICD-10-CM | POA: Diagnosis present

## 2010-11-12 DIAGNOSIS — I2510 Atherosclerotic heart disease of native coronary artery without angina pectoris: Principal | ICD-10-CM | POA: Diagnosis present

## 2010-11-12 DIAGNOSIS — E876 Hypokalemia: Secondary | ICD-10-CM | POA: Diagnosis not present

## 2010-11-12 DIAGNOSIS — K219 Gastro-esophageal reflux disease without esophagitis: Secondary | ICD-10-CM | POA: Diagnosis present

## 2010-11-12 DIAGNOSIS — I2582 Chronic total occlusion of coronary artery: Secondary | ICD-10-CM | POA: Insufficient documentation

## 2010-11-12 DIAGNOSIS — M1990 Unspecified osteoarthritis, unspecified site: Secondary | ICD-10-CM | POA: Diagnosis present

## 2010-11-12 DIAGNOSIS — R9439 Abnormal result of other cardiovascular function study: Secondary | ICD-10-CM | POA: Insufficient documentation

## 2010-11-12 DIAGNOSIS — D62 Acute posthemorrhagic anemia: Secondary | ICD-10-CM | POA: Diagnosis not present

## 2010-11-12 DIAGNOSIS — J449 Chronic obstructive pulmonary disease, unspecified: Secondary | ICD-10-CM | POA: Diagnosis present

## 2010-11-12 DIAGNOSIS — Z79899 Other long term (current) drug therapy: Secondary | ICD-10-CM

## 2010-11-12 DIAGNOSIS — E8779 Other fluid overload: Secondary | ICD-10-CM | POA: Diagnosis not present

## 2010-11-12 DIAGNOSIS — I10 Essential (primary) hypertension: Secondary | ICD-10-CM | POA: Diagnosis present

## 2010-11-12 DIAGNOSIS — E785 Hyperlipidemia, unspecified: Secondary | ICD-10-CM | POA: Diagnosis present

## 2010-11-12 LAB — BLOOD GAS, ARTERIAL
Acid-Base Excess: 3.8 mmol/L — ABNORMAL HIGH (ref 0.0–2.0)
Bicarbonate: 27.7 mEq/L — ABNORMAL HIGH (ref 20.0–24.0)
Drawn by: 331001
FIO2: 0.21 %
O2 Saturation: 96.5 %
Patient temperature: 98.6
TCO2: 28.9 mmol/L (ref 0–100)
pCO2 arterial: 40.8 mmHg (ref 35.0–45.0)
pH, Arterial: 7.446 — ABNORMAL HIGH (ref 7.350–7.400)
pO2, Arterial: 83.2 mmHg (ref 80.0–100.0)

## 2010-11-12 LAB — COMPREHENSIVE METABOLIC PANEL
ALT: 12 U/L (ref 0–35)
AST: 21 U/L (ref 0–37)
Albumin: 3.4 g/dL — ABNORMAL LOW (ref 3.5–5.2)
Alkaline Phosphatase: 68 U/L (ref 39–117)
BUN: 6 mg/dL (ref 6–23)
CO2: 28 mEq/L (ref 19–32)
Calcium: 9.1 mg/dL (ref 8.4–10.5)
Chloride: 109 mEq/L (ref 96–112)
Creatinine, Ser: 0.54 mg/dL (ref 0.4–1.2)
GFR calc Af Amer: >60 mL/min (ref >60)
GFR calc non Af Amer: >60 mL/min (ref >60)
Glucose, Bld: 81 mg/dL (ref 70–99)
Potassium: 4.2 mEq/L (ref 3.5–5.1)
Sodium: 142 mEq/L (ref 135–145)
Total Bilirubin: 0.3 mg/dL (ref 0.3–1.2)
Total Protein: 6.4 g/dL (ref 6.0–8.3)

## 2010-11-12 LAB — TYPE AND SCREEN
ABO/RH(D): B POS
Antibody Screen: NEGATIVE

## 2010-11-12 LAB — CBC
HCT: 42.4 % (ref 36.0–46.0)
Hemoglobin: 14.4 g/dL (ref 12.0–15.0)
MCH: 30.7 pg (ref 26.0–34.0)
MCHC: 34 g/dL (ref 30.0–36.0)
MCV: 90.4 fL (ref 78.0–100.0)
Platelets: 217 10*3/uL (ref 150–400)
RBC: 4.69 MIL/uL (ref 3.87–5.11)
RDW: 14.7 % (ref 11.5–15.5)
WBC: 6.6 10*3/uL (ref 4.0–10.5)

## 2010-11-12 LAB — PROTIME-INR
INR: 0.93 (ref 0.00–1.49)
Prothrombin Time: 12.7 seconds (ref 11.6–15.2)

## 2010-11-12 LAB — APTT: aPTT: 26 seconds (ref 24–37)

## 2010-11-12 LAB — ABO/RH: ABO/RH(D): B POS

## 2010-11-12 LAB — TSH: TSH: 0.056 u[IU]/mL — ABNORMAL LOW (ref 0.350–4.500)

## 2010-11-13 ENCOUNTER — Inpatient Hospital Stay (HOSPITAL_COMMUNITY): Payer: Medicare Other

## 2010-11-13 DIAGNOSIS — I2510 Atherosclerotic heart disease of native coronary artery without angina pectoris: Secondary | ICD-10-CM

## 2010-11-13 LAB — POCT I-STAT 4, (NA,K, GLUC, HGB,HCT)
Glucose, Bld: 117 mg/dL — ABNORMAL HIGH (ref 70–99)
Glucose, Bld: 66 mg/dL — ABNORMAL LOW (ref 70–99)
Glucose, Bld: 75 mg/dL (ref 70–99)
Glucose, Bld: 92 mg/dL (ref 70–99)
Glucose, Bld: 106 mg/dL — ABNORMAL HIGH (ref 70–99)
Glucose, Bld: 117 mg/dL — ABNORMAL HIGH (ref 70–99)
HCT: 39 % (ref 36.0–46.0)
HCT: 25 % — ABNORMAL LOW (ref 36.0–46.0)
HCT: 37 % (ref 36.0–46.0)
HCT: 22 % — ABNORMAL LOW (ref 36.0–46.0)
HCT: 23 % — ABNORMAL LOW (ref 36.0–46.0)
HCT: 29 % — ABNORMAL LOW (ref 36.0–46.0)
Hemoglobin: 13.3 g/dL (ref 12.0–15.0)
Hemoglobin: 12.6 g/dL (ref 12.0–15.0)
Hemoglobin: 8.5 g/dL — ABNORMAL LOW (ref 12.0–15.0)
Hemoglobin: 7.5 g/dL — ABNORMAL LOW (ref 12.0–15.0)
Hemoglobin: 7.8 g/dL — ABNORMAL LOW (ref 12.0–15.0)
Hemoglobin: 9.9 g/dL — ABNORMAL LOW (ref 12.0–15.0)
Potassium: 3.4 mEq/L — ABNORMAL LOW (ref 3.5–5.1)
Potassium: 3.5 mEq/L (ref 3.5–5.1)
Potassium: 3.7 mEq/L (ref 3.5–5.1)
Potassium: 4.9 mEq/L (ref 3.5–5.1)
Potassium: 4.4 mEq/L (ref 3.5–5.1)
Potassium: 4.2 mEq/L (ref 3.5–5.1)
Sodium: 141 mEq/L (ref 135–145)
Sodium: 138 mEq/L (ref 135–145)
Sodium: 141 mEq/L (ref 135–145)
Sodium: 140 mEq/L (ref 135–145)
Sodium: 139 mEq/L (ref 135–145)
Sodium: 141 mEq/L (ref 135–145)

## 2010-11-13 LAB — POCT I-STAT 3, ART BLOOD GAS (G3+)
Acid-Base Excess: 1 mmol/L (ref 0.0–2.0)
Acid-base deficit: 3 mmol/L — ABNORMAL HIGH (ref 0.0–2.0)
Acid-base deficit: 1 mmol/L (ref 0.0–2.0)
Bicarbonate: 26 mEq/L — ABNORMAL HIGH (ref 20.0–24.0)
Bicarbonate: 22.9 mEq/L (ref 20.0–24.0)
Bicarbonate: 23.9 mEq/L (ref 20.0–24.0)
O2 Saturation: 100 %
O2 Saturation: 93 %
O2 Saturation: 98 %
Patient temperature: 34.6
Patient temperature: 36.8
TCO2: 27 mmol/L (ref 0–100)
TCO2: 24 mmol/L (ref 0–100)
TCO2: 25 mmol/L (ref 0–100)
pCO2 arterial: 42.2 mmHg (ref 35.0–45.0)
pCO2 arterial: 38.6 mmHg (ref 35.0–45.0)
pCO2 arterial: 41 mmHg (ref 35.0–45.0)
pH, Arterial: 7.398 (ref 7.350–7.400)
pH, Arterial: 7.371 (ref 7.350–7.400)
pH, Arterial: 7.374 (ref 7.350–7.400)
pO2, Arterial: 252 mmHg — ABNORMAL HIGH (ref 80.0–100.0)
pO2, Arterial: 62 mmHg — ABNORMAL LOW (ref 80.0–100.0)
pO2, Arterial: 99 mmHg (ref 80.0–100.0)

## 2010-11-13 LAB — GLUCOSE, CAPILLARY
Glucose-Capillary: 143 mg/dL — ABNORMAL HIGH (ref 70–99)
Glucose-Capillary: 131 mg/dL — ABNORMAL HIGH (ref 70–99)
Glucose-Capillary: 142 mg/dL — ABNORMAL HIGH (ref 70–99)
Glucose-Capillary: 122 mg/dL — ABNORMAL HIGH (ref 70–99)
Glucose-Capillary: 102 mg/dL — ABNORMAL HIGH (ref 70–99)
Glucose-Capillary: 107 mg/dL — ABNORMAL HIGH (ref 70–99)

## 2010-11-13 LAB — MAGNESIUM: Magnesium: 2.8 mg/dL — ABNORMAL HIGH (ref 1.5–2.5)

## 2010-11-13 LAB — CBC
HCT: 29.8 % — ABNORMAL LOW (ref 36.0–46.0)
HCT: 28.5 % — ABNORMAL LOW (ref 36.0–46.0)
Hemoglobin: 10 g/dL — ABNORMAL LOW (ref 12.0–15.0)
Hemoglobin: 9.7 g/dL — ABNORMAL LOW (ref 12.0–15.0)
MCH: 30.3 pg (ref 26.0–34.0)
MCH: 30.6 pg (ref 26.0–34.0)
MCHC: 33.6 g/dL (ref 30.0–36.0)
MCHC: 34 g/dL (ref 30.0–36.0)
MCV: 90.3 fL (ref 78.0–100.0)
MCV: 89.9 fL (ref 78.0–100.0)
Platelets: 111 10*3/uL — ABNORMAL LOW (ref 150–400)
Platelets: 121 10*3/uL — ABNORMAL LOW (ref 150–400)
RBC: 3.3 MIL/uL — ABNORMAL LOW (ref 3.87–5.11)
RBC: 3.17 MIL/uL — ABNORMAL LOW (ref 3.87–5.11)
RDW: 14.5 % (ref 11.5–15.5)
RDW: 14.3 % (ref 11.5–15.5)
WBC: 7.9 10*3/uL (ref 4.0–10.5)
WBC: 10.7 10*3/uL — ABNORMAL HIGH (ref 4.0–10.5)

## 2010-11-13 LAB — POCT I-STAT, CHEM 8
BUN: 7 mg/dL (ref 6–23)
Calcium, Ion: 1.16 mmol/L (ref 1.12–1.32)
Chloride: 108 mEq/L (ref 96–112)
Creatinine, Ser: 0.6 mg/dL (ref 0.4–1.2)
Glucose, Bld: 112 mg/dL — ABNORMAL HIGH (ref 70–99)
HCT: 27 % — ABNORMAL LOW (ref 36.0–46.0)
Hemoglobin: 9.2 g/dL — ABNORMAL LOW (ref 12.0–15.0)
Potassium: 3.6 mEq/L (ref 3.5–5.1)
Sodium: 143 mEq/L (ref 135–145)
TCO2: 23 mmol/L (ref 0–100)

## 2010-11-13 LAB — PLATELET COUNT: Platelets: 106 10*3/uL — ABNORMAL LOW (ref 150–400)

## 2010-11-13 LAB — PROTIME-INR
INR: 1.45 (ref 0.00–1.49)
Prothrombin Time: 17.8 seconds — ABNORMAL HIGH (ref 11.6–15.2)

## 2010-11-13 LAB — CREATININE, SERUM
Creatinine, Ser: 0.46 mg/dL (ref 0.4–1.2)
GFR calc Af Amer: >60 mL/min (ref >60)
GFR calc non Af Amer: >60 mL/min (ref >60)

## 2010-11-13 LAB — POCT I-STAT GLUCOSE
Glucose, Bld: 99 mg/dL (ref 70–99)
Operator id: 3406

## 2010-11-13 LAB — HEMOGLOBIN AND HEMATOCRIT, BLOOD
HCT: 22.6 % — ABNORMAL LOW (ref 36.0–46.0)
Hemoglobin: 7.8 g/dL — ABNORMAL LOW (ref 12.0–15.0)

## 2010-11-13 LAB — APTT: aPTT: 33 seconds (ref 24–37)

## 2010-11-13 LAB — MRSA PCR SCREENING: MRSA by PCR: NEGATIVE

## 2010-11-14 ENCOUNTER — Inpatient Hospital Stay (HOSPITAL_COMMUNITY): Payer: Medicare Other

## 2010-11-14 LAB — GLUCOSE, CAPILLARY
Glucose-Capillary: 116 mg/dL — ABNORMAL HIGH (ref 70–99)
Glucose-Capillary: 144 mg/dL — ABNORMAL HIGH (ref 70–99)
Glucose-Capillary: 124 mg/dL — ABNORMAL HIGH (ref 70–99)
Glucose-Capillary: 113 mg/dL — ABNORMAL HIGH (ref 70–99)
Glucose-Capillary: 133 mg/dL — ABNORMAL HIGH (ref 70–99)
Glucose-Capillary: 139 mg/dL — ABNORMAL HIGH (ref 70–99)

## 2010-11-14 LAB — CBC
HCT: 27.7 % — ABNORMAL LOW (ref 36.0–46.0)
HCT: 28.8 % — ABNORMAL LOW (ref 36.0–46.0)
Hemoglobin: 9.4 g/dL — ABNORMAL LOW (ref 12.0–15.0)
Hemoglobin: 9.5 g/dL — ABNORMAL LOW (ref 12.0–15.0)
MCH: 30.5 pg (ref 26.0–34.0)
MCH: 30.2 pg (ref 26.0–34.0)
MCHC: 33.9 g/dL (ref 30.0–36.0)
MCHC: 33 g/dL (ref 30.0–36.0)
MCV: 89.9 fL (ref 78.0–100.0)
MCV: 91.4 fL (ref 78.0–100.0)
Platelets: 118 10*3/uL — ABNORMAL LOW (ref 150–400)
Platelets: 120 10*3/uL — ABNORMAL LOW (ref 150–400)
RBC: 3.08 MIL/uL — ABNORMAL LOW (ref 3.87–5.11)
RBC: 3.15 MIL/uL — ABNORMAL LOW (ref 3.87–5.11)
RDW: 14.6 % (ref 11.5–15.5)
RDW: 15.1 % (ref 11.5–15.5)
WBC: 10.2 10*3/uL (ref 4.0–10.5)
WBC: 10.5 10*3/uL (ref 4.0–10.5)

## 2010-11-14 LAB — POCT I-STAT, CHEM 8
BUN: 18 mg/dL (ref 6–23)
Calcium, Ion: 1.16 mmol/L (ref 1.12–1.32)
Chloride: 105 mEq/L (ref 96–112)
Creatinine, Ser: 1 mg/dL (ref 0.4–1.2)
Glucose, Bld: 140 mg/dL — ABNORMAL HIGH (ref 70–99)
HCT: 29 % — ABNORMAL LOW (ref 36.0–46.0)
Hemoglobin: 9.9 g/dL — ABNORMAL LOW (ref 12.0–15.0)
Potassium: 4.3 mEq/L (ref 3.5–5.1)
Sodium: 140 mEq/L (ref 135–145)
TCO2: 22 mmol/L (ref 0–100)

## 2010-11-14 LAB — BASIC METABOLIC PANEL
BUN: 9 mg/dL (ref 6–23)
CO2: 24 mEq/L (ref 19–32)
Calcium: 7.6 mg/dL — ABNORMAL LOW (ref 8.4–10.5)
Chloride: 111 mEq/L (ref 96–112)
Creatinine, Ser: 0.51 mg/dL (ref 0.4–1.2)
GFR calc Af Amer: >60 mL/min (ref >60)
GFR calc non Af Amer: >60 mL/min (ref >60)
Glucose, Bld: 139 mg/dL — ABNORMAL HIGH (ref 70–99)
Potassium: 4.2 mEq/L (ref 3.5–5.1)
Sodium: 138 mEq/L (ref 135–145)

## 2010-11-14 LAB — MAGNESIUM
Magnesium: 2.6 mg/dL — ABNORMAL HIGH (ref 1.5–2.5)
Magnesium: 2.3 mg/dL (ref 1.5–2.5)

## 2010-11-14 LAB — CREATININE, SERUM
Creatinine, Ser: 0.91 mg/dL (ref 0.4–1.2)
GFR calc Af Amer: >60 mL/min (ref >60)
GFR calc non Af Amer: >60 mL/min (ref >60)

## 2010-11-15 ENCOUNTER — Inpatient Hospital Stay (HOSPITAL_COMMUNITY): Payer: Medicare Other

## 2010-11-15 LAB — GLUCOSE, CAPILLARY
Glucose-Capillary: 137 mg/dL — ABNORMAL HIGH (ref 70–99)
Glucose-Capillary: 160 mg/dL — ABNORMAL HIGH (ref 70–99)
Glucose-Capillary: 163 mg/dL — ABNORMAL HIGH (ref 70–99)
Glucose-Capillary: 112 mg/dL — ABNORMAL HIGH (ref 70–99)

## 2010-11-15 LAB — CBC
HCT: 27.5 % — ABNORMAL LOW (ref 36.0–46.0)
Hemoglobin: 9.4 g/dL — ABNORMAL LOW (ref 12.0–15.0)
MCH: 30.9 pg (ref 26.0–34.0)
MCHC: 34.2 g/dL (ref 30.0–36.0)
MCV: 90.5 fL (ref 78.0–100.0)
Platelets: 115 10*3/uL — ABNORMAL LOW (ref 150–400)
RBC: 3.04 MIL/uL — ABNORMAL LOW (ref 3.87–5.11)
RDW: 15 % (ref 11.5–15.5)
WBC: 9.3 10*3/uL (ref 4.0–10.5)

## 2010-11-15 LAB — BASIC METABOLIC PANEL
BUN: 17 mg/dL (ref 6–23)
CO2: 27 mEq/L (ref 19–32)
Calcium: 8 mg/dL — ABNORMAL LOW (ref 8.4–10.5)
Chloride: 106 mEq/L (ref 96–112)
Creatinine, Ser: 0.8 mg/dL (ref 0.4–1.2)
GFR calc Af Amer: >60 mL/min (ref >60)
GFR calc non Af Amer: >60 mL/min (ref >60)
Glucose, Bld: 136 mg/dL — ABNORMAL HIGH (ref 70–99)
Potassium: 3.7 mEq/L (ref 3.5–5.1)
Sodium: 137 mEq/L (ref 135–145)

## 2010-11-16 ENCOUNTER — Inpatient Hospital Stay (HOSPITAL_COMMUNITY): Payer: Medicare Other

## 2010-11-16 LAB — BASIC METABOLIC PANEL
BUN: 15 mg/dL (ref 6–23)
CO2: 29 mEq/L (ref 19–32)
Calcium: 8.1 mg/dL — ABNORMAL LOW (ref 8.4–10.5)
Chloride: 100 mEq/L (ref 96–112)
Creatinine, Ser: 0.72 mg/dL (ref 0.4–1.2)
GFR calc Af Amer: >60 mL/min (ref >60)
GFR calc non Af Amer: >60 mL/min (ref >60)
Glucose, Bld: 112 mg/dL — ABNORMAL HIGH (ref 70–99)
Potassium: 3.1 mEq/L — ABNORMAL LOW (ref 3.5–5.1)
Sodium: 136 mEq/L (ref 135–145)

## 2010-11-16 LAB — CBC
HCT: 28.5 % — ABNORMAL LOW (ref 36.0–46.0)
Hemoglobin: 9.7 g/dL — ABNORMAL LOW (ref 12.0–15.0)
MCH: 30.2 pg (ref 26.0–34.0)
MCHC: 34 g/dL (ref 30.0–36.0)
MCV: 88.8 fL (ref 78.0–100.0)
Platelets: 143 10*3/uL — ABNORMAL LOW (ref 150–400)
RBC: 3.21 MIL/uL — ABNORMAL LOW (ref 3.87–5.11)
RDW: 14.7 % (ref 11.5–15.5)
WBC: 11 10*3/uL — ABNORMAL HIGH (ref 4.0–10.5)

## 2010-11-16 LAB — GLUCOSE, CAPILLARY
Glucose-Capillary: 116 mg/dL — ABNORMAL HIGH (ref 70–99)
Glucose-Capillary: 121 mg/dL — ABNORMAL HIGH (ref 70–99)
Glucose-Capillary: 108 mg/dL — ABNORMAL HIGH (ref 70–99)
Glucose-Capillary: 112 mg/dL — ABNORMAL HIGH (ref 70–99)

## 2010-11-17 LAB — GLUCOSE, CAPILLARY
Glucose-Capillary: 112 mg/dL — ABNORMAL HIGH (ref 70–99)
Glucose-Capillary: 106 mg/dL — ABNORMAL HIGH (ref 70–99)
Glucose-Capillary: 116 mg/dL — ABNORMAL HIGH (ref 70–99)

## 2010-11-18 LAB — BASIC METABOLIC PANEL
BUN: 9 mg/dL (ref 6–23)
CO2: 26 mEq/L (ref 19–32)
Calcium: 8.3 mg/dL — ABNORMAL LOW (ref 8.4–10.5)
Chloride: 107 mEq/L (ref 96–112)
Creatinine, Ser: 0.59 mg/dL (ref 0.4–1.2)
GFR calc Af Amer: >60 mL/min (ref >60)
GFR calc non Af Amer: >60 mL/min (ref >60)
Glucose, Bld: 109 mg/dL — ABNORMAL HIGH (ref 70–99)
Potassium: 4 mEq/L (ref 3.5–5.1)
Sodium: 138 mEq/L (ref 135–145)

## 2010-11-18 LAB — GLUCOSE, CAPILLARY
Glucose-Capillary: 126 mg/dL — ABNORMAL HIGH (ref 70–99)
Glucose-Capillary: 105 mg/dL — ABNORMAL HIGH (ref 70–99)

## 2010-11-18 NOTE — Procedures (Signed)
NAMECAROLYN, [name] NO.:  [id number]  MEDICAL RECORD NO.:  [id number]           PATIENT TYPE:  I  LOCATION:  3701                         FACILITY:  [facility]  PHYSICIAN:  Verne Carrow, MDDATE OF BIRTH:  11/30/1945  DATE OF PROCEDURE:  11/12/2010 DATE OF DISCHARGE:                           CARDIAC CATHETERIZATION   PRIMARY CARDIOLOGIST:  Madolyn Frieze. Jens Som, MD, [facility]  PROCEDURES PERFORMED: 1. Left heart catheterization. 2. Selective coronary angiography. 3. Left ventricular angiogram.  OPERATOR:  Verne Carrow, MD  INDICATIONS:  This is a 65 year old African American female with a history of hypertension, hyperlipidemia, and prior nonobstructive coronary artery disease, who has been followed in the past by Dr. Olga Millers.  She was added on to my schedule in the office last week after she underwent Lexiscan stress Myoview which showed ischemia in the anteroapical wall.  We arranged the diagnostic catheterization for today.  DETAILS OF PROCEDURE:  The patient was brought to the outpatient cardiac catheterization laboratory after signing informed consent for the procedure.  The right groin was prepped and draped in sterile fashion. A 1% lidocaine was used for local anesthesia.  A 4-French sheath was inserted into the right femoral artery without difficulty.  Selective coronary angiography was performed with standard diagnostic catheters. A pigtail catheter was used to perform a left ventricular angiogram. The patient tolerated the diagnostic procedure well.  She was taken to the recovery room in stable condition.  HEMODYNAMIC FINDINGS: 1. Central aortic pressure 150/69. 2. Left ventricular pressure 150/9/18.  ANGIOGRAPHIC FINDINGS: 1. The left main is heavily calcified and has mild plaque disease with     no obstructive lesions. 2. The left anterior descending is a large vessel that coursed to the     apex and is diffusely disease.   The proximal portion of the vessel     has a 90% calcified stenosis just before the takeoff of a moderate-     to-large sized diagonal branch.  The diagonal branch is involved in     the stenosis and has a 50% ostial stenosis.  Just beyond the tight     stenosis in the proximal left anterior descending artery is an     aneurysmal segment.  Further downstream in the left anterior     descending artery, there is 100% occlusion of the mid vessel.  This     appears to be chronic.  The distal left anterior descending artery     fills from left-to-left bridging collaterals. 3. The circumflex artery gives off a moderate-sized trifurcating     obtuse marginal branch.  The AV groove circumflex is small in     caliber.  The proximal portion of the obtuse marginal branch has     calcification and a 70% stenosis. 4. The right coronary artery is a large dominant vessel with 30%     serial lesions throughout the proximal mid vessel. 5. Left ventricular angiogram was performed in the RAO projection and     showed reduced left ventricular systolic function with ejection     fraction of 40%.  There is hypokinesis of the anterior wall  and     akinesis of the apical wall.  IMPRESSION: 1. Severe double-vessel coronary artery disease. 2. Moderate left ventricular systolic dysfunction.  RECOMMENDATIONS:  This patient has severe complex disease in the left anterior descending artery involving multiple areas including chronic total occlusion in the mid segment.  I feel that any attempts at percutaneous intervention would be difficult secondary to the proximal lesion involving the diagonal branch and also having an aneurysmal segment just distal to the stenosis.  The mid chronic total occlusion will also be a difficult lesion to approached percutaneously.  Given this patient's young age, I think that she would do well with a surgical revascularization.  We will place consultation to cardiothoracic  surgery for consideration of coronary artery bypass grafting surgery.  We will admit her to a telemetry bed.     Verne Carrow, MD     CM/MEDQ  D:  11/12/2010  T:  11/13/2010  Job:  [id number]  cc:   Madolyn Frieze. Jens Som, MD, [facility]  Electronically Signed by Verne Carrow MD on 11/18/2010 08:37:30 AM

## 2010-11-25 NOTE — Consult Note (Signed)
[name], [name] NO.:  [id number]  MEDICAL RECORD NO.:  [id number]           PATIENT TYPE:  I  LOCATION:  3799                         FACILITY:  [facility]  PHYSICIAN:  Salvatore Decent. Dorris Fetch, M.D.DATE OF BIRTH:  1945/09/26  DATE OF CONSULTATION:  11/12/2010 DATE OF DISCHARGE:                                CONSULTATION   REASON FOR CONSULTATION:  Three-vessel coronary artery disease.  HISTORY OF PRESENT ILLNESS:  [name] is a 65 year old African American woman with a history of coronary artery disease, hypertension, and hyperlipidemia.  She recently has been under a great deal of stress over the past month due to the help of sister-in-law and a coworker, both have major health problems, so she has been very busy in helping them.  She noticed that she was getting short of breath with exertion and also just felt extremely tired all the time.  She was easily fatigued and general lack of energy.  She saw Dr. Jens Som.  A Lexiscan Myoview was performed that showed evidence of anterior apical ischemia. She has known coronary artery disease with a 50% LAD lesion and 75% lesions in the right coronary artery documented in 2004.  Ejection fraction by Myoview was 55%.  She was scheduled for cardiac catheterization which was performed today by Dr. Clifton James, it revealed three-vessel coronary artery disease.  There was moderate disease in the right coronary and circumflex.  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  There was involvement at the origin of the diagonal and the mid LAD was 100% occluded just beyond this area. Ejection fraction was approximately 40% with anterior and apical hypokinesis.  PAST MEDICAL HISTORY:  Significant for: 1. Coronary artery disease. 2. Hypertension. 3. Hyperlipidemia. 4. Degenerative joint disease. 5. Hypothyroidism. 6.  Colonic polyps. 7. Gastroesophageal reflux. 8. Nonspecific pulmonary nodules. 9. centrilobular emphysema and COPD. 10.Possible history of rheumatic fever. 11.Osteopenia.  PAST SURGICAL HISTORY:  Significant for hysterectomy, thyroidectomy, tonsillectomy, and carpal tunnel surgery on her right hand.  MEDICATIONS PRIOR TO ADMISSION: 1. Levothyroxine 88 mcg daily. 2. Amlodipine 5 mg daily. 3. Ecotrin 81 mg daily. 4. Vitamin D 1000 units daily. 5. Hydrochlorothiazide 25 mg daily. 6. Fish oil one tablet daily. 7. Pravastatin 40 mg nightly. 8. Vicodin 5/325 one to two q.6 h. as needed. 9. Multivitamin one daily.  She has no known drug allergies.  FAMILY HISTORY:  Negative for premature coronary artery disease, although her mother died at young age in an auto accident.  SOCIAL HISTORY:  She is a smoker, does not use alcohol.  She is divorced.  She works with [employer].  REVIEW OF SYSTEMS:  Shortness of breath, fatigue, lack of energy is noted.  She denies fevers, chills, sweats.  No orthopnea, paroxysmal nocturnal dyspnea or peripheral edema.  No history of excessive bleeding or bruising.  No change in bowel or bladder habits.  All other systems are negative.  PHYSICAL EXAMINATION:  GENERAL:  [name] is a well-appearing 65 year old woman in no acute distress.  She is  well developed and well nourished. NEUROLOGIC:  She is alert and oriented x3 with no focal deficits. HEENT:  She is wearing glasses, otherwise unremarkable. NECK:  Supple without thyromegaly, adenopathy, or bruits. CARDIAC:  Has regular rate and rhythm.  Normal S1 and S2.  No rubs, murmurs, or gallops. LUNGS:  Clear. EXTREMITIES:  Have 2+ pulses throughout.  There was no edema.  LABORATORY RESULTS:  Cardiac catheterization as noted.  Carotid duplex showed no evidence of ICA stenosis.  Her sodium was 142, potassium 4.2, BUN 6, creatinine 0.54, glucose 81.  PT 12.7, PTT 26.  White count 6.6, hematocrit 42, and  platelets 217.  IMPRESSION:  [name] is a 65 year old woman with multiple cardiac risk factors who presents with 73-month history of progressive anginal symptoms.  She had a positive Myoview and in catheterization, has severe complex disease of her left anterior descending artery and moderate disease in her circumflex and right coronary artery.  Coronary artery bypass grafting is indicated for survival benefit as well as relief of symptoms.  I discussed in detail with her the indications, risks, benefits, and alternatives.  She understands that the risks include but are not limited to death, stroke, myocardial infarction, deep venous thrombosis, pulmonary embolism, bleeding, possible need for transfusions, infections as well as other organ system dysfunction including respiratory, renal or gastrointestinal complications.  She understands and accepts these risks and agrees to proceed with surgery.  She has been scheduled for first case tomorrow morning.  Thank you very much.     Salvatore Decent Dorris Fetch, M.D.     SCH/MEDQ  D:  11/12/2010  T:  11/13/2010  Job:  [id number]  cc:   Madolyn Frieze. Jens Som, MD, [facility] Peyton Najjar, MD  Electronically Signed by Charlett Lango M.D. on 11/25/2010 11:56:37 AM

## 2010-11-25 NOTE — Discharge Summary (Signed)
NAMEKELLYN, [name] NO.:  [id number]  MEDICAL RECORD NO.:  [id number]           PATIENT TYPE:  I  LOCATION:  2028                         FACILITY:  [facility]  PHYSICIAN:  Salvatore Decent. Dorris Fetch, M.D.DATE OF BIRTH:  1946-03-23  DATE OF ADMISSION:  11/12/2010 DATE OF DISCHARGE:                              DISCHARGE SUMMARY   FINAL DIAGNOSIS:  Three-vessel coronary artery disease with positive Myoview.  IN-HOSPITAL DIAGNOSES: 1. Acute blood loss anemia postoperatively. 2. Volume overload postoperatively.  SECONDARY DIAGNOSES: 1. Hypertension. 2. Hyperlipidemia. 3. Degenerative joint disease. 4. Hypothyroidism. 5. Colonic polyps. 6. Gastroesophageal reflux disease. 7. Nonspecific pulmonary nodules. 8. Centrilobular emphysema and chronic obstructive pulmonary disease. 9. History of rheumatic fever. 10.Osteopenia. 11.Status post hysterectomy. 12.Status post thyroidectomy. 13.Status post tonsillectomy. 14.Status post carpal tunnel surgery, right hand.  IN-HOSPITAL OPERATIONS AND PROCEDURES: 1. Cardiac catheterization done by Dr. Clifton James on November 12, 2010. 2. Coronary artery bypass grafting x4 using a left internal mammary     artery to left anterior descending, saphenous vein graft to acute     marginal, sequential saphenous vein graft to first diagonal and     obtuse marginal 1, endoscopic vein harvesting from bilateral leg.  HISTORY AND PHYSICAL AND HOSPITAL COURSE:  [name] is a 65 year old African American female with history of coronary artery disease, hypertension, and hyperlipidemia.  The patient started having shortness of breath with exertion and feeling extremely tired all the time.  She was easily fatigued and general lack of energy.  She saw Dr. Jens Som where she underwent Myoview study which showed evidence of anterior apical ischemia.  The patient has known coronary artery disease with a 50% LAD lesion, 75% lesion in the right  coronary artery documented in 2004.  Ejection fraction by Myoview is 55%.  Dr. Jens Som recommended the patient to undergo cardiac catheterization.  This was scheduled with Dr. Clifton James for November 12, 2010.  For further details of the patient's past medical history and physical exam, please see dictated H and P.  The patient was taken to the Cath Lab on November 12, 2010, where she underwent cardiac catheterization by Dr. Clifton James.  This revealed three- vessel coronary artery disease.  Ejection fraction was approximately 40% with anterior and apical hypokinesis.  Following cardiac catheterization, it was felt that the patient would be a candidate for coronary artery bypass grafting and Dr. Dorris Fetch was consulted.  Dr. Dorris Fetch saw and evaluated the patient.  He discussed with the patient undergoing coronary artery bypass grafting.  He discussed risks and benefits with the patient.  The patient acknowledged understanding and agreed to proceed.  She was scheduled for surgery for November 13, 2010.  The patient was admitted to [facility] on November 12, 2010 following her cardiac catheterization.  Preoperatively, she underwent bilateral carotid duplex ultrasound, showing no significant ICA stenosis.  The patient remained stable preoperatively.  The patient was taken to the operating room on November 13, 2010, where she underwent coronary artery bypass grafting x4 using left internal mammary artery to left anterior descending, saphenous vein graft to acute marginal, sequential saphenous vein graft to first diagonal and  obtuse marginal 1, endoscopic vein harvesting from bilateral legs.  The patient tolerated this procedure well and was transferred to the intensive care unit in stable condition.  Postoperatively, the patient was noted to be hemodynamically stable.  She was extubated on the evening of surgery. Postextubation, the patient noted to be alert and oriented x4.  Neuro intact.   Postoperatively, the patient was in normal sinus rhythm.  Blood pressure was stable.  All drips were able to be weaned and discontinued. She was started on low-dose beta-blocker as well as addition of an ACE inhibitor and restarted on her Norvasc.  Currently, the patient has remained in normal sinus rhythm postoperatively and blood pressure is stable.  On postop day #1, chest x-ray obtained, noted to be stable. The patient had minimum drainage from chest tubes and chest tubes were discontinued in routine fashion.  Followup chest x-ray remained stable with bilateral pleural effusions, small-to-moderate in size.  The patient was encouraged to use her incentive spirometer.  She currently remains on 2 liters oxygen to maintain O2 sats greater than 90%.  We will continue to wean oxygen off to maintain O2 sats greater than 90% on room air.  The patient has been continued on diuretics to assist in improvement of the bilateral pleural effusions.  The patient continued to progress well and she was transferred out of the SICU on postop day #2.  While in telemetry floor, the patient was up ambulating well with cardiac rehab.  She is tolerating diet well and no nausea or vomiting noted.  She was on diuretics for her pleural effusions as well as volume overload.  Daily weights were obtained and her volume overload was improving.  The patient remained 4 kg above her preoperative weight as of now.  We will continue diuretics at the time of discharge.  The patient did have some mild acute blood loss anemia postoperatively, but this was stable.  The patient did not require any blood transfusions postoperatively.  Most recent hemoglobin and hematocrit was 9.7 and 28.5%.  Currently, all incisions are clean, dry and intact and healing well.  The patient is tolerating diet well and no nausea or vomiting noted.  She has remained afebrile postoperatively.  Last lab work obtained on November 16, 2010 shows white  blood cell count 11.0, hemoglobin 9.7, hematocrit 20.5, platelet count of 143.  Sodium of 136, potassium 3.1, chloride of 100, bicarbonate of 29, BUN 15, creatinine of 0.72, glucose of 112.  The patient's potassium is being replaced this a.m. and will be rechecked tomorrow.  The patient is tentatively ready for discharge to home in the next 24-48 hours pending she is able to be weaned off oxygen and volume overload stabilizes as well as improvement in her pleural effusion.  FOLLOWUP APPOINTMENTS:  A followup appointment has been arranged with Dr. Sunday Corn physician assistant for December 07, 2010 at 1:30 p.m. The patient will need to obtain PA and lateral chest x-ray 45 minutes prior to this appointment.  The patient will need to follow up with Dr. Jens Som in 2 weeks.  She will need to contact his office to make these arrangements.  ACTIVITY:  The patient is instructed no driving to released to do so, no heavy lifting over 10 pounds.  She is told to ambulate 3-4 times per day, progress as tolerated and continue her breathing exercises.  INCISIONAL CARE:  The patient is told to shower, washing her incisions using soap and water.  She is to contact the  office if she develops any drainage or opening from any of her incision sites.  DIET:  The patient is educated on diet to be low fat, low salt.  DISCHARGE MEDICATIONS: 1. Lasix 40 mg daily x7 days. 2. Lopressor 25 mg b.i.d. 3. Oxycodone 5 mg one to two tablets q.3 h. p.r.n. pain. 4. Altace 5 mg daily. 5. Enteric-coated aspirin 325 mg daily. 6. Potassium chloride 20 mEq daily x7 days. 7. Calcium carbonate daily. 8. Levoxyl 88 mcg daily. 9. Multivitamin daily. 10.Norvasc 5 mg daily. 11.Pravastatin 40 mg daily. 12.Vitamin D3 1200 units daily.     Sol Blazing, PA   ______________________________ Salvatore Decent Dorris Fetch, M.D.    KMD/MEDQ  D:  11/17/2010  T:  11/18/2010  Job:  [id number]  cc:   Madolyn Frieze. Jens Som, MD,  [facility]  Electronically Signed by Cameron Proud PA on 11/18/2010 11:03:40 AM Electronically Signed by Charlett Lango M.D. on 11/25/2010 11:56:57 AM

## 2010-11-25 NOTE — Op Note (Signed)
NAMESONJI, STARKES NO.:  [id number]  MEDICAL RECORD NO.:  [id number]           PATIENT TYPE:  I  LOCATION:  2301                         FACILITY:  [facility]  PHYSICIAN:  Salvatore Decent. Dorris Fetch, M.D.DATE OF BIRTH:  1946-03-03  DATE OF PROCEDURE:  11/13/2010 DATE OF DISCHARGE:                              OPERATIVE REPORT   PREOPERATIVE DIAGNOSIS:  Three-vessel coronary disease with positive Myoview.  POSTOPERATIVE DIAGNOSIS:  Three-vessel coronary disease with positive Myoview.  PROCEDURE:  Median sternotomy, extracorporeal circulation, coronary artery bypass grafting x4 (left internal mammary artery to LAD, saphenous vein graft to acute marginal, sequential saphenous vein graft to first diagonal and obtuse marginal 1), endoscopic vein harvest both legs.  SURGEON:  Salvatore Decent. Dorris Fetch, MD  ASSISTANT:  Coral Ceo, PA.  SECOND ASSISTANT:  Stephanie Acre Dasovich, PA  ANESTHESIA:  General.FINDINGS:  Good quality target vessels, saphenous vein from right leg unusable, saphenous vein from left leg good quality, mammary small but good quality, marked emphysema, sternal osteoporosis.  CLINICAL NOTE:  [name] is a 65 year old woman with history of coronary disease under a stress recently has been having dyspnea with exertion.  She had a positive Myoview study and underwent cardiac catheterization where she was found to have 3-vessel coronary disease with preserved left ventricular function.  A coronary artery bypass grafting was indicated the setting of 3-vessel disease with symptoms. The indications, risk, benefits, and alternatives were discussed in detail with the patient.  She understood and accepted the risk and agreed to proceed.  OPERATIVE NOTE:  [name] was brought to the preop holding area on November 13, 2010.  There the Anesthesia Service placed Swan-Ganz catheter and arterial blood pressure monitoring line.  Intravenous antibiotics were  administered.  She was taken to the operating room, anesthetized, and intubated.  A Foley catheter was placed.  The chest, abdomen, and legs were prepped and draped in usual sterile fashion.  Incision was made in the medial aspect of right leg at the level of the knee.  The greater saphenous vein was identified.  It was relatively small and was harvested endoscopically from groin to mid calf.  2000 units of heparin was administered during the vessel harvest.  This vein turned out to be inadequate for use.  The left leg was explored at the knee and the greater saphenous vein there was of good quality, was harvested from the thigh.  Simultaneously with this a median sternotomy was performed. There was sternal osteoporosis.  The left internal mammary artery was harvested in the standard fashion.  There was good flow through the graft when divided distally. After harvesting the conduits, the remainder of full heparin dose was given, the pericardium was opened. There was calcific plaque in the ascending aorta at the origin of the innominate.  The aorta was cannulated proximal to that.  A dual stage venous cannula was placed via purse suture in the right atrium. Cardiopulmonary bypass was instituted and the patient was cooled to 32 degrees Celsius.  The coronary arteries were inspected and anastomotic sites were chosen.  The conduits were inspected and cut to length.  A  foam pad was placed in the pericardium to insulate the heart and protect the left phrenic nerve.  Temperature probe was placed in myocardial septum and a cardioplegic cannula was placed in the ascending aorta.  The aorta was crossclamped, the left ventricle was emptied via the aortic root vent.  Cardiac arrest was then achieved with combination of cold antegrade blood cardioplegia.  and topical iced saline.  One liter of cardioplegia was administered.  There was rapid diastolic arrest and myocardial septum cooled to 10 degrees  Celsius, the following distal anastomoses were performed.  First a reverse saphenous vein graft was placed end-to-side to the acute marginal branch of the right coronary.  Right coronary bifurcated very proximally.  There was a great deal of calcification in the proximal vessel.  There were moderate stenoses.  The acute marginal was the larger of the 2 branches of the bifurcation and traversed the inferior wall towards the distal septum.  It was a 2-mm good quality target.  The vein graft was anastomosed end-to-side with a running 7-0 Prolene suture.  There was excellent flow.  Cardioplegia was administered. There was good hemostasis.  Next a reverse saphenous vein graft was placed sequentially to the first diagonal and first obtuse marginal.  First diagonal was a high anterolateral branch.  It was intramyocardial.  There was a 1.5-mm good- quality target.  A side-to-side anastomosis was performed in this vessel.  Probe passed easily proximally and distally.  The distal ends of vein graft then was cut to length, was anastomosed end-to-side to the first obtuse marginal which was a fairly proximal anterolateral vessel itself.  That was a 1.5-mm vessel.  It likewise was intramyocardial. The anastomosis was performed with a running 7-0 Prolene suture.  There was excellent flow through the graft.  Cardioplegia was administered. There was good hemostasis at both anastomoses.  Additional cardioplegia was administered down the aortic root.  The left internal mammary artery was brought through a window in the pericardium and the distal end was beveled.  This was anastomosed end-to-side to distal LAD.  The distal LAD was grafted just beyond the total occlusion. It was a 2-mm good quality target site anastomosis, mammary was 1.5 mm good-quality.  Conduit anastomoses performed end-to-side with running 8- 0 Prolene suture.  At completion of the mammary to LAD anastomosis, clamps briefly removed to  inspect for hemostasis.  Immediate rapid septal rewarming was noted.  Bulldog clamps were placed.  Mammary pedicle was tacked to epicardial surface of heart with 6-0 Prolene sutures.  Additional cardioplegia was administered.  The vein grafts were cut to length.  The cardioplegic cannula was removed from the ascending aorta. The proximal vein graft anastomoses were performed to 4.5 mm punch aortotomies with running 6-0 Prolene sutures.  At the completion of final proximal anastomosis, the patient was placed in Trendelenburg position.  Lidocaine was administered.  The aortic root was de-aired. The aortic crossclamp was removed.  Total crossclamp time was 67 minutes.  The patient spontaneously resumed sinus rhythm, did not require defibrillation.  While rewarming was completed, all proximal and distal anastomoses were inspected for hemostasis.  Epicardial pacing wires placed on the right ventricle and right atrium.  The patient was rewarmed to a core temperature of 37 degrees Celsius.  She was weaned from cardiopulmonary bypass on the first attempt.  Total bypass time was 99 minutes.  Initial cardiac output was approximately 2 liters/minute/meter squared but with volume administration it increased to 3 liters per minute and the  patient remained hemodynamically stable thereafter.  A test dose of protamine was administered and was well tolerated.  The atrial and aortic cannulae were removed.  The remainder of protamine was administered without incident.  Chest was irrigated with warm saline. Hemostasis was achieved.  The left pleural and single mediastinal chest tubes were placed with separate subcostal incisions.  The pericardium was reapproximated with interrupted 3-0 silk sutures and came together easily without tension.  The sternum was closed with heavy gauge stainless steel wires.  The pectoralis fascia, subcutaneous tissue, and skin were closed in standard fashion.  All sponge,  needle, and instrument counts were correct at the end of the procedure.  The patient was taken from the operating room to the surgical intensive care unit in good condition.     Salvatore Decent Dorris Fetch, M.D.     SCH/MEDQ  D:  11/13/2010  T:  11/14/2010  Job:  [id number]  cc:   Madolyn Frieze. Jens Som, MD, [facility]  Electronically Signed by Charlett Lango M.D. on 11/25/2010 11:56:51 AM

## 2010-11-29 LAB — BASIC METABOLIC PANEL
BUN: 16 mg/dL (ref 6–23)
BUN: 7 mg/dL (ref 6–23)
CO2: 30 mEq/L (ref 19–32)
CO2: 31 mEq/L (ref 19–32)
Calcium: 9.1 mg/dL (ref 8.4–10.5)
Calcium: 9.9 mg/dL (ref 8.4–10.5)
Chloride: 104 mEq/L (ref 96–112)
Chloride: 104 mEq/L (ref 96–112)
Creatinine, Ser: 0.79 mg/dL (ref 0.4–1.2)
Creatinine, Ser: 0.86 mg/dL (ref 0.4–1.2)
GFR calc Af Amer: >60 mL/min (ref >60)
GFR calc Af Amer: >60 mL/min (ref >60)
GFR calc non Af Amer: >60 mL/min (ref >60)
GFR calc non Af Amer: >60 mL/min (ref >60)
Glucose, Bld: 120 mg/dL — ABNORMAL HIGH (ref 70–99)
Glucose, Bld: 125 mg/dL — ABNORMAL HIGH (ref 70–99)
Potassium: 3.4 mEq/L — ABNORMAL LOW (ref 3.5–5.1)
Potassium: 4.2 mEq/L (ref 3.5–5.1)
Sodium: 143 mEq/L (ref 135–145)
Sodium: 144 mEq/L (ref 135–145)

## 2010-11-29 LAB — PROTIME-INR
INR: 1.1 (ref 0.00–1.49)
Prothrombin Time: 15 seconds (ref 11.6–15.2)

## 2010-11-29 LAB — CBC
HCT: 43.3 % (ref 36.0–46.0)
HCT: 43.7 % (ref 36.0–46.0)
Hemoglobin: 14.8 g/dL (ref 12.0–15.0)
Hemoglobin: 14.9 g/dL (ref 12.0–15.0)
MCHC: 34 g/dL (ref 30.0–36.0)
MCHC: 34.2 g/dL (ref 30.0–36.0)
MCV: 92.7 fL (ref 78.0–100.0)
MCV: 92.7 fL (ref 78.0–100.0)
Platelets: 229 10*3/uL (ref 150–400)
Platelets: 268 10*3/uL (ref 150–400)
RBC: 4.67 MIL/uL (ref 3.87–5.11)
RBC: 4.71 MIL/uL (ref 3.87–5.11)
RDW: 14.2 % (ref 11.5–15.5)
RDW: 14.4 % (ref 11.5–15.5)
WBC: 8.5 10*3/uL (ref 4.0–10.5)
WBC: 9.1 10*3/uL (ref 4.0–10.5)

## 2010-11-29 LAB — DIFFERENTIAL
Basophils Absolute: 0 10*3/uL (ref 0.0–0.1)
Basophils Relative: 0 % (ref 0–1)
Eosinophils Absolute: 0.2 10*3/uL (ref 0.0–0.7)
Eosinophils Relative: 2 % (ref 0–5)
Lymphocytes Relative: 30 % (ref 12–46)
Lymphs Abs: 2.7 10*3/uL (ref 0.7–4.0)
Monocytes Absolute: 0.5 10*3/uL (ref 0.1–1.0)
Monocytes Relative: 5 % (ref 3–12)
Neutro Abs: 5.7 10*3/uL (ref 1.7–7.7)
Neutrophils Relative %: 62 % (ref 43–77)

## 2010-11-29 LAB — COMPREHENSIVE METABOLIC PANEL
ALT: 19 U/L (ref 0–35)
AST: 21 U/L (ref 0–37)
Albumin: 3.5 g/dL (ref 3.5–5.2)
Alkaline Phosphatase: 67 U/L (ref 39–117)
BUN: 7 mg/dL (ref 6–23)
CO2: 28 mEq/L (ref 19–32)
Calcium: 8.7 mg/dL (ref 8.4–10.5)
Chloride: 104 mEq/L (ref 96–112)
Creatinine, Ser: 0.7 mg/dL (ref 0.4–1.2)
GFR calc Af Amer: >60 mL/min (ref >60)
GFR calc non Af Amer: >60 mL/min (ref >60)
Glucose, Bld: 107 mg/dL — ABNORMAL HIGH (ref 70–99)
Potassium: 3.2 mEq/L — ABNORMAL LOW (ref 3.5–5.1)
Sodium: 140 mEq/L (ref 135–145)
Total Bilirubin: 0.6 mg/dL (ref 0.3–1.2)
Total Protein: 6.6 g/dL (ref 6.0–8.3)

## 2010-11-29 LAB — CARDIAC PANEL(CRET KIN+CKTOT+MB+TROPI)
CK, MB: 3.3 ng/mL (ref 0.3–4.0)
CK, MB: 4.1 ng/mL — ABNORMAL HIGH (ref 0.3–4.0)
Relative Index: 1.3 (ref 0.0–2.5)
Relative Index: 1.5 (ref 0.0–2.5)
Total CK: 246 U/L — ABNORMAL HIGH (ref 7–177)
Total CK: 267 U/L — ABNORMAL HIGH (ref 7–177)
Troponin I: 0.01 ng/mL (ref 0.00–0.06)
Troponin I: 0.01 ng/mL (ref 0.00–0.06)

## 2010-11-29 LAB — LIPID PANEL
Cholesterol: 122 mg/dL (ref 0–200)
HDL: 69 mg/dL (ref >39)
LDL Cholesterol: 48 mg/dL (ref 0–99)
Total CHOL/HDL Ratio: 1.8 RATIO
Triglycerides: 27 mg/dL (ref <150)
VLDL: 5 mg/dL (ref 0–40)

## 2010-11-29 LAB — HEMOGLOBIN A1C
Hgb A1c MFr Bld: 5.9 % (ref 4.6–6.1)
Mean Plasma Glucose: 123 mg/dL

## 2010-11-29 LAB — POCT CARDIAC MARKERS
CKMB, poc: 1.8 ng/mL (ref 1.0–8.0)
Myoglobin, poc: 105 ng/mL (ref 12–200)
Troponin i, poc: <0.05 ng/mL (ref 0.00–0.09)

## 2010-11-29 LAB — CK TOTAL AND CKMB (NOT AT ARMC)
CK, MB: 3.3 ng/mL (ref 0.3–4.0)
Relative Index: 1.4 (ref 0.0–2.5)
Total CK: 243 U/L — ABNORMAL HIGH (ref 7–177)

## 2010-11-29 LAB — TSH: TSH: 1.714 u[IU]/mL (ref 0.350–4.500)

## 2010-11-29 LAB — APTT: aPTT: 28 seconds (ref 24–37)

## 2010-11-29 LAB — TROPONIN I: Troponin I: 0.02 ng/mL (ref 0.00–0.06)

## 2010-12-01 ENCOUNTER — Ambulatory Visit: Payer: Medicare Other | Admitting: Cardiovascular Disease

## 2010-12-04 ENCOUNTER — Other Ambulatory Visit: Payer: Self-pay | Admitting: Thoracic Surgery (Cardiothoracic Vascular Surgery)

## 2010-12-04 DIAGNOSIS — I2510 Atherosclerotic heart disease of native coronary artery without angina pectoris: Secondary | ICD-10-CM

## 2010-12-07 ENCOUNTER — Encounter: Payer: Self-pay | Admitting: Cardiology

## 2010-12-07 ENCOUNTER — Ambulatory Visit
Admission: RE | Admit: 2010-12-07 | Discharge: 2010-12-07 | Disposition: A | Discharge status: Home / Self Care | Payer: Medicare Other | Source: Ambulatory Visit | Attending: Thoracic Surgery (Cardiothoracic Vascular Surgery) | Admitting: Thoracic Surgery (Cardiothoracic Vascular Surgery)

## 2010-12-07 ENCOUNTER — Encounter (INDEPENDENT_AMBULATORY_CARE_PROVIDER_SITE_OTHER): Payer: Self-pay

## 2010-12-07 ENCOUNTER — Ambulatory Visit (INDEPENDENT_AMBULATORY_CARE_PROVIDER_SITE_OTHER): Payer: Medicare Other | Admitting: Cardiology

## 2010-12-07 DIAGNOSIS — I2510 Atherosclerotic heart disease of native coronary artery without angina pectoris: Secondary | ICD-10-CM

## 2010-12-07 DIAGNOSIS — E7800 Pure hypercholesterolemia, unspecified: Secondary | ICD-10-CM

## 2010-12-07 DIAGNOSIS — I10 Essential (primary) hypertension: Secondary | ICD-10-CM

## 2010-12-07 MED ORDER — RAMIPRIL 10 MG PO CAPS: 10.0000 mg | ORAL_CAPSULE | Freq: Every day | ORAL | Status: DC

## 2010-12-07 NOTE — Assessment & Plan Note (Signed)
Continue aspirin and statin. We'll also continue beta blocker and ACE inhibitor. Given reduced LV function I will discontinue Norvasc and increase Altace 10 mg p.o. Daily. Check potassium and renal function in one week. Plan repeat echocardiogram in 3 months to assess LV function.

## 2010-12-07 NOTE — Assessment & Plan Note (Signed)
TSH low in the hospital. Followup with her primary care for management of Synthroid dose.

## 2010-12-07 NOTE — Assessment & Plan Note (Addendum)
Continue statin. Check lipids and liver. 

## 2010-12-07 NOTE — Progress Notes (Signed)
HPI: 65 year old female for followup of coronary artery disease.Echocardiogram in 2008 at [facility] showed normal LV function and no significant valve disease. Patient had a Myoview in March of 2012 which showed anterior ischemia. Subsequent cardiac catheterization revealed an ejection fraction of 40%, and significant LAD/obtuse marginal disease. The patient underwent coronary artery bypass and graft in March 2012 with a LIMA to the LAD, a sequential saphenous vein graft to the diagonal and obtuse marginal and a saphenous vein graft to the acute marginal. Note TSH low. Since discharge, she has some dyspnea on exertion but no orthopnea, PND, pedal edema, palpitations, syncope or chest pain.    Current Outpatient Prescriptions  Medication Sig Dispense Refill  . amLODipine (NORVASC) 5 MG tablet Take 5 mg by mouth daily.        Marland Kitchen aspirin 325 MG tablet Take 325 mg by mouth daily.        . Calcium Carb-Cholecalciferol (CALCIUM 1000 + D PO) Take by mouth daily.        . calcium carbonate 200 MG capsule Take 250 mg by mouth 2 (two) times daily with a meal.        . Cholecalciferol (VITAMIN D3) 1000 UNITS CAPS 2 tabs po qd       . furosemide (LASIX) 40 MG tablet Take 40 mg by mouth 2 (two) times daily.        Marland Kitchen levothyroxine (SYNTHROID, LEVOTHROID) 88 MCG tablet Take 88 mcg by mouth daily.        . metoprolol tartrate (LOPRESSOR) 25 MG tablet Take 25 mg by mouth 2 (two) times daily.        . Multiple Vitamin (MULTIVITAMIN) capsule Take 1 capsule by mouth daily.        Marland Kitchen oxycodone (OXY-IR) 5 MG capsule Take 5 mg by mouth every 4 (four) hours as needed.        . pravastatin (PRAVACHOL) 40 MG tablet Take 40 mg by mouth daily.        . ramipril (ALTACE) 5 MG capsule Take 5 mg by mouth daily.        Marland Kitchen DISCONTD: potassium chloride SA (K-DUR,KLOR-CON) 20 MEQ tablet Take 1 tablet (20 mEq total) by mouth daily.  36 tablet  6     Past Medical History  Diagnosis Date  . Coronary artery disease   . Hypertension    . Hyperlipidemia   . Hypothyroidism   . Osteoarthritis   . Gastroesophageal reflux disease   . FH: colonic polyps     Past Surgical History  Procedure Date  . Coronary artery bypass and graft     LIMA to the LAD, saphenous vein graft to the acute marginal, saphenous vein graft to the diagonal and obtuse marginal.  . Abdominal hysterectomy   . Thyroidectomy   . Tonsillectomy   . Carpal tunnel release     History   Social History  . Marital Status: Single    Spouse Name: N/A    Number of Children: N/A  . Years of Education: N/A   Occupational History  . Not on file.   Social History Main Topics  . Smoking status: Former Games developer  . Smokeless tobacco: Not on file  . Alcohol Use: Not on file  . Drug Use: Not on file  . Sexually Active: Not on file   Other Topics Concern  . Not on file   Social History Narrative  . No narrative on file    ROS: no fevers or chills, productive  cough, hemoptysis, dysphasia, odynophagia, melena, hematochezia, dysuria, hematuria, rash, seizure activity, orthopnea, PND, pedal edema, claudication. Remaining systems are negative.  Physical Exam: Well-developed well-nourished in no acute distress.  Skin is warm and dry.  HEENT is normal.  Neck is supple. No thyromegaly.  Chest is clear to auscultation with normal expansion. Sternotomy without evidence of infection. Cardiovascular exam is regular rate and rhythm.  Abdominal exam nontender or distended. No masses palpated. Extremities show no edema. neuro grossly intact

## 2010-12-07 NOTE — Assessment & Plan Note (Signed)
Given ischemic cardiomyopathy discontinue Norvasc and increase ACE inhibitor.

## 2010-12-07 NOTE — Patient Instructions (Signed)
Your physician recommends that you schedule a follow-up appointment in: 3 MONTHS  Your physician recommends that you return for lab work in: ONE WEEK-LIPID/LIVER/BMP-272.0/414.01/401.1/V58.69  STOP AMLODIPINE  INCREASE RAMIPRIL TO 10MG  ONCE DAILY  THYROID FUNCTION WAS LOW IN THE HOSPITAL-NEED TO FOLLOW UP WITH PRIMARY MD ABOUT MEDICINE CHANGE.  Your physician has requested that you have an echocardiogram. Echocardiography is a painless test that uses sound waves to create images of your heart. It provides your doctor with information about the size and shape of your heart and how well your heart's chambers and valves are working. This procedure takes approximately one hour. There are no restrictions for this procedure.PRIOR TO APPT IN 3 MONTHS

## 2010-12-08 NOTE — Assessment & Plan Note (Signed)
OFFICE VISIT  TAILA, BASINSKI DOB:  [birth year]                                        December 07, 2010 CHART #:  [id number]  The patient comes in today for 3-week postoperative followup.  She is status post CABG x4 on December 14, 2010.  Her postoperative course was generally uneventful and she was able to be discharged home on November 18, 2010, in good condition.  Since her discharge home, she has continued to progress well.  She has had some mild soreness and still takes oxycodone occasionally if needed for pain, but otherwise feels that she is progressing well.  She is walking several times daily and denies any shortness of breath or lower extremity edema.  Her appetite is still not back to normal, but she is using boost supplements several times a day in addition to her regular diet.  She has also quit smoking and is doing well from this standpoint.  She has an appointment to see Dr. Jens Som later today for Cardiology followup.  PHYSICAL EXAMINATION:  Blood pressure is 140/85, pulse is 84 and regular, respirations 16 and unlabored, O2 sat 96% on room air.  Her sternal and right lower extremity incisions have all healed well with no erythema or drainage.  Sternum is stable.  Heart, regular rate and rhythm without murmurs, rubs or gallops.  Lungs are clear to auscultation, although very mildly diminished breath sounds in the left base.  No lower extremity edema.  Chest x-ray shows significant improvement in bilateral effusions with still a tiny effusion in the left base.  ASSESSMENT/PLAN:  The patient is doing very well overall, status post coronary artery bypass grafting.  At this point, she may begin increase her activity and may begin driving short distances.  She will follow up as directed with Dr. Jens Som.  She also has asked about cardiac rehab and I have encouraged her to proceed as long as she is cleared from a cardiology standpoint.  We will see  her back as needed in the future.  Coral Ceo, P.A.  GC/MEDQ  D:  12/07/2010  T:  12/08/2010  Job:  [id number]

## 2010-12-15 ENCOUNTER — Telehealth: Payer: Self-pay | Admitting: Cardiology

## 2010-12-15 ENCOUNTER — Other Ambulatory Visit (INDEPENDENT_AMBULATORY_CARE_PROVIDER_SITE_OTHER): Payer: Medicare Other | Admitting: *Deleted

## 2010-12-15 DIAGNOSIS — E7800 Pure hypercholesterolemia, unspecified: Secondary | ICD-10-CM

## 2010-12-15 DIAGNOSIS — Z79899 Other long term (current) drug therapy: Secondary | ICD-10-CM

## 2010-12-15 LAB — LIPID PANEL
Cholesterol: 150 mg/dL (ref 0–200)
HDL: 54.6 mg/dL (ref >39.00)
LDL Cholesterol: 84 mg/dL (ref 0–99)
Total CHOL/HDL Ratio: 3
Triglycerides: 55 mg/dL (ref 0.0–149.0)
VLDL: 11 mg/dL (ref 0.0–40.0)

## 2010-12-15 LAB — BASIC METABOLIC PANEL
BUN: 16 mg/dL (ref 6–23)
CO2: 28 mEq/L (ref 19–32)
Calcium: 9.2 mg/dL (ref 8.4–10.5)
Chloride: 108 mEq/L (ref 96–112)
Creatinine, Ser: 0.8 mg/dL (ref 0.4–1.2)
GFR: 93.49 mL/min (ref >60.00)
Glucose, Bld: 92 mg/dL (ref 70–99)
Potassium: 4.3 mEq/L (ref 3.5–5.1)
Sodium: 144 mEq/L (ref 135–145)

## 2010-12-15 LAB — HEPATIC FUNCTION PANEL
ALT: 9 U/L (ref 0–35)
AST: 15 U/L (ref 0–37)
Albumin: 3.2 g/dL — ABNORMAL LOW (ref 3.5–5.2)
Alkaline Phosphatase: 65 U/L (ref 39–117)
Bilirubin, Direct: 0.1 mg/dL (ref 0.0–0.3)
Total Bilirubin: 0.4 mg/dL (ref 0.3–1.2)
Total Protein: 6.4 g/dL (ref 6.0–8.3)

## 2010-12-15 MED ORDER — PRAVASTATIN SODIUM 80 MG PO TABS: 80.0000 mg | ORAL_TABLET | Freq: Every day | ORAL | Status: DC

## 2010-12-15 NOTE — Telephone Encounter (Signed)
Pt returned [name]   Call, pt is at home

## 2010-12-15 NOTE — Telephone Encounter (Signed)
pt aware of lab results [name]  

## 2010-12-23 ENCOUNTER — Encounter: Payer: Self-pay | Admitting: Cardiology

## 2010-12-24 ENCOUNTER — Encounter: Payer: Self-pay | Admitting: Internal Medicine

## 2010-12-24 ENCOUNTER — Ambulatory Visit (INDEPENDENT_AMBULATORY_CARE_PROVIDER_SITE_OTHER): Payer: Medicare Other | Admitting: Internal Medicine

## 2010-12-24 VITALS — BP 150/80 | HR 76 | Temp 98.1°F | Ht 62.0 in | Wt 116.1 lb

## 2010-12-24 DIAGNOSIS — Z0000 Encounter for general adult medical examination without abnormal findings: Secondary | ICD-10-CM

## 2010-12-24 DIAGNOSIS — M79604 Pain in right leg: Secondary | ICD-10-CM

## 2010-12-24 DIAGNOSIS — I10 Essential (primary) hypertension: Secondary | ICD-10-CM

## 2010-12-24 DIAGNOSIS — M79609 Pain in unspecified limb: Secondary | ICD-10-CM

## 2010-12-24 DIAGNOSIS — E785 Hyperlipidemia, unspecified: Secondary | ICD-10-CM

## 2010-12-24 DIAGNOSIS — E039 Hypothyroidism, unspecified: Secondary | ICD-10-CM

## 2010-12-24 NOTE — Assessment & Plan Note (Signed)
Diffuse Below the knee with trace swelling and homan neg, calve nontender but will need to r/o DVT - pt adamant no ER and no lovenox tonight; will order test asap; cont asa

## 2010-12-24 NOTE — Patient Instructions (Addendum)
Continue all other medications as before Please see our [facility] to have the test for the leg ordered (venous doppler RLE) Please return in 7 mo with Lab testing done 3-5 days before

## 2010-12-25 ENCOUNTER — Other Ambulatory Visit: Payer: Self-pay | Admitting: Internal Medicine

## 2010-12-25 ENCOUNTER — Ambulatory Visit (INDEPENDENT_AMBULATORY_CARE_PROVIDER_SITE_OTHER): Payer: Medicare Other | Admitting: *Deleted

## 2010-12-25 DIAGNOSIS — M79604 Pain in right leg: Secondary | ICD-10-CM

## 2010-12-25 DIAGNOSIS — M79609 Pain in unspecified limb: Secondary | ICD-10-CM

## 2010-12-28 ENCOUNTER — Encounter: Payer: Self-pay | Admitting: Internal Medicine

## 2010-12-28 NOTE — Progress Notes (Signed)
Quick Note:  Voice message left on [employer] system - lab is negative, normal or otherwise stable, pt to continue same tx ______ 

## 2011-01-03 ENCOUNTER — Encounter: Payer: Self-pay | Admitting: Internal Medicine

## 2011-01-03 NOTE — Assessment & Plan Note (Addendum)
stable overall by hx and exam, most recent lab reviewed with pt, and pt to continue medical treatment as before  Lab Results  Component Value Date   TSH 0.056* 11/12/2010   To f/u with TSH next visit

## 2011-01-03 NOTE — Progress Notes (Signed)
Subjective:    Patient ID: [name], female    DOB: Nov 11, 1945, 65 y.o.   MRN: [id number]  HPI Here to f/u, c/o pain to the RLE below the knee with more swelling than usual for 3 days;  Pt denies chest pain, increased sob or doe, wheezing, orthopnea, PND, increased LE swelling, palpitations, dizziness or syncope.  Pt denies new neurological symptoms such as new headache, or facial or extremity weakness or numbness   Pt denies polydipsia, polyuria,  Pt states overall good compliance with meds, trying to follow lower cholesterol diet, wt overall stable but little exercise however.  Denies hyper or hypo thyroid symptoms such as voice, skin or hair change.   Past Medical History  Diagnosis Date  . Coronary artery disease   . Hypertension   . Hyperlipidemia   . Hypothyroidism   . Osteoarthritis   . Gastroesophageal reflux disease   . FH: colonic polyps   . HYPOTHYROIDISM 09/07/2007  . HYPERLIPIDEMIA 09/07/2007  . HYPOKALEMIA 09/12/2008  . TOBACCO ABUSE 07/21/2009  . HYPERTENSION 09/07/2007  . URI 06/26/2008  . ALLERGIC RHINITIS 09/07/2007  . GERD 09/07/2007  . CONSTIPATION 02/19/2010  . OSTEOARTHRITIS 09/07/2007  . DISC DISEASE, CERVICAL 03/20/2009  . Pain in Soft Tissues of Limb 05/23/2008  . OSTEOPENIA 09/07/2007  . DIZZINESS 06/26/2008  . RASH-NONVESICULAR 12/10/2009  . MURMUR 06/17/2009  . CHEST PAIN 11/06/2007  . FREQUENCY, URINARY 07/27/2010  . ABDOMINAL PAIN RIGHT UPPER QUADRANT 11/06/2007  . COLONIC POLYPS, HX OF 09/07/2007  . CAD 09/08/2010   Past Surgical History  Procedure Date  . Coronary artery bypass and graft     LIMA to the LAD, saphenous vein graft to the acute marginal, saphenous vein graft to the diagonal and obtuse marginal.  . Abdominal hysterectomy   . Thyroidectomy   . Tonsillectomy   . Carpal tunnel release     reports that she has been smoking.  She does not have any smokeless tobacco history on file. She reports that she does not drink alcohol or use illicit  drugs. family history includes Arthritis in her other; Diabetes in her other; Heart disease in her other; and Hypertension in her other. No Known Allergies Current Outpatient Prescriptions on File Prior to Visit  Medication Sig Dispense Refill  . aspirin 325 MG tablet Take 325 mg by mouth daily.        . Calcium Carb-Cholecalciferol (CALCIUM 1000 + D PO) Take by mouth daily.        . calcium carbonate 200 MG capsule Take 250 mg by mouth 2 (two) times daily with a meal.        . Cholecalciferol (VITAMIN D3) 1000 UNITS CAPS 2 tabs po qd       . furosemide (LASIX) 40 MG tablet Take 40 mg by mouth 2 (two) times daily.        Marland Kitchen levothyroxine (SYNTHROID, LEVOTHROID) 88 MCG tablet Take 88 mcg by mouth daily.        . metoprolol tartrate (LOPRESSOR) 25 MG tablet Take 25 mg by mouth 2 (two) times daily.        . Multiple Vitamin (MULTIVITAMIN) capsule Take 1 capsule by mouth daily.        Marland Kitchen oxycodone (OXY-IR) 5 MG capsule Take 5 mg by mouth every 4 (four) hours as needed.        . pravastatin (PRAVACHOL) 80 MG tablet Take 1 tablet (80 mg total) by mouth daily.  30 tablet  12  .  ramipril (ALTACE) 10 MG capsule Take 1 capsule (10 mg total) by mouth daily.  30 capsule  12     Review of Systems All otherwise neg per pt     Objective:   Physical Exam BP 150/80  Pulse 76  Temp(Src) 98.1 F (36.7 C) (Oral)  Ht 5\' 2"  (1.575 m)  Wt 116 lb 2 oz (52.674 kg)  BMI 21.24 kg/m2  SpO2 94% Physical Exam  VS noted Constitutional: Pt appears well-developed and well-nourished.  HENT: Head: Normocephalic.  Right Ear: External ear normal.  Left Ear: External ear normal.  Eyes: Conjunctivae and EOM are normal. Pupils are equal, round, and reactive to light.  Neck: Normal range of motion. Neck supple.  Cardiovascular: Normal rate and regular rhythm.   Pulmonary/Chest: Effort normal and breath sounds normal.  Abd:  Soft, NT, non-distended, + BS Neurological: Pt is alert. No cranial nerve deficit.  Skin:  Skin is warm. No erythema. but trace to 1+ edema and mild right calf tender Psychiatric: Pt behavior is normal. Thought content normal.         Assessment & Plan:

## 2011-01-03 NOTE — Assessment & Plan Note (Signed)
stable overall by hx and exam, most recent lab reviewed with pt, and pt to continue medical treatment as before  Lab Results  Component Value Date   WBC 11.0* 11/16/2010   HGB 9.7* 11/16/2010   HCT 28.5* 11/16/2010   PLT 143* 11/16/2010   CHOL 150 12/15/2010   TRIG 55.0 12/15/2010   HDL 54.60 12/15/2010   ALT 9 12/15/2010   AST 15 12/15/2010   NA 144 12/15/2010   K 4.3 12/15/2010   CL 108 12/15/2010   CREATININE 0.8 12/15/2010   BUN 16 12/15/2010   CO2 28 12/15/2010   TSH 0.056* 11/12/2010   INR 1.45 11/13/2010   HGBA1C  Value: 5.9 (NOTE) The ADA recommends the following therapeutic goal for glycemic control related to Hgb A1c measurement: Goal of therapy: <6.5 Hgb A1c  Reference: American Diabetes Association: Clinical Practice Recommendations 2010, Diabetes Care, 2010, 33: (Suppl  1). 02/21/2009

## 2011-01-03 NOTE — Assessment & Plan Note (Signed)
stable overall by hx and exam, most recent lab reviewed with pt, and pt to continue medical treatment as before  Lab Results  Component Value Date   LDLCALC 84 12/15/2010

## 2011-01-05 ENCOUNTER — Encounter: Payer: Self-pay | Admitting: Cardiology

## 2011-01-05 ENCOUNTER — Ambulatory Visit (INDEPENDENT_AMBULATORY_CARE_PROVIDER_SITE_OTHER): Payer: Medicare Other | Admitting: Cardiology

## 2011-01-05 DIAGNOSIS — E7800 Pure hypercholesterolemia, unspecified: Secondary | ICD-10-CM

## 2011-01-05 DIAGNOSIS — I2510 Atherosclerotic heart disease of native coronary artery without angina pectoris: Secondary | ICD-10-CM

## 2011-01-05 DIAGNOSIS — Z79899 Other long term (current) drug therapy: Secondary | ICD-10-CM

## 2011-01-05 DIAGNOSIS — I10 Essential (primary) hypertension: Secondary | ICD-10-CM

## 2011-01-05 MED ORDER — HYDROCHLOROTHIAZIDE 12.5 MG PO CAPS: 12.5000 mg | ORAL_CAPSULE | Freq: Every day | ORAL | Status: DC

## 2011-01-05 MED ORDER — HYDROCHLOROTHIAZIDE 12.5 MG PO CAPS: 25.0000 mg | ORAL_CAPSULE | Freq: Every day | ORAL | Status: DC

## 2011-01-05 MED ORDER — AMLODIPINE BESYLATE 5 MG PO TABS: 5.0000 mg | ORAL_TABLET | Freq: Every day | ORAL | Status: DC

## 2011-01-05 NOTE — Discharge Summary (Signed)
Alicia [name], [name] NO.:  [id number]   MEDICAL RECORD NO.:  [id number]          PATIENT TYPE:  INP   LOCATION:  4735                         FACILITY:  [facility]   PHYSICIAN:  Titus Dubin. Hopper, MD,FACP,FCCPDATE OF BIRTH:  30-Mar-1944   DATE OF ADMISSION:  02/21/2009  DATE OF DISCHARGE:  02/22/2009                               DISCHARGE SUMMARY   ADMITTING DIAGNOSES:  1. Right upper arm pain.  2. Chest pain.   DISCHARGE DIAGNOSES:  1. Right upper arm pain, resolved.  2. Chest pain, resolved.  3. Cervical spondylitic changes  with myelomalacia, essentially stable      compared to MRI of the cervical spine on October 09, 2008.   For details of history and physical, please see Dr. Adela Glimpse dictation of  February 21, 2009.   The patient is a smoker, has family history of premature coronary artery  disease.  She presented with over 24 hours of radicular pain in the  right upper extremity, right shoulder, and tip of the fingers.  She also  had chest pain for approximately 3 hours from 11:00 p.m. to 2:00 a.m.  prior to admission.   Initial CBC, chemistries, and cardiac enzymes were negative.  EKG  revealed normal sinus rhythm.  Chest x-ray was unremarkable.   MRI of the spine without contrast was essentially stable compared to  that ordered by Dr. Sharolyn Douglas on October 09, 2008.   Initial labs revealed glucose of 125 and potassium of 4.2.  Repeat  comprehensive metabolic profile revealed a potassium of 3.2.   Despite the elevated random glucose, her A1c was 5.9, nondiabetic.  Repeat BMET at the day of discharge revealed a potassium of 3.4; she had  been on hydrochlorothiazide 25 mg prior to admission.  She states she  does not like bananas.   The pain has resolved and she was asymptomatic on the morning of  discharge and requesting to be discharged.   She states that she  feel as if the right upper extremity is cold and  this has been a problem since she had  surgery in 2007, by hand  specialist.   PHYSICAL EXAMINATION:  GENERAL:  The right radial artery was palpable.  It was difficult to define the ulnar artery  pulse.  VITAL SIGNS:  On the morning of discharge, she was afebrile with a pulse  of 77, respiratory rate of 20, and blood pressure 132/81.  O2 sats were  90% on room air.  HEART:  She exhibited an S4.  CHEST:  Clear.  She had essentially normal  RUE range of motion w/o  pain.  There was no edema or color change in the right upper extremity.   Her lipids revealed a LDL of 48 on simvastatin 40 mg daily.  HDL was 69.  It will be deferred to Dr. Jonny Ruiz as to whether this dose of simvastatin  is to be decreased.   She was discharged on levothyroxine 200 mcg daily.  Her TSH was 1.71.   OTHER MEDICINES:  1. Amlodipine 5 mg daily.  2. Aspirin 81 mg daily.  3.  Simvastatin 40 mg at bedtime (see, above).   Hydrochlorothiazide was discontinued and spironolactone 25 mg was  initiated because of the hypokalemia.   DISCHARGE STATUS:  Improved.   Her symptoms were felt to be related to the cervical spondylotic  changes.  Nerve conduction EMG studies can be performed if her symptoms  are recurrent.   There will be no change in her diet and exercise prior to admission.  She has been asked to see Dr. Jonny Ruiz in 10-14 days and have repeat  chemistry study to assess the hypokalemia, and BUN and creatinine in  view of her changing from hydrochlorothiazide to spironolactone.      Titus Dubin. Alwyn Ren, MD,FACP,FCCP  Electronically Signed     [facility]/MEDQ  D:  02/22/2009  T:  02/22/2009  Job:  [id number]   cc:   Corwin Levins, MD

## 2011-01-05 NOTE — Assessment & Plan Note (Signed)
Blood pressure remains elevated. I will discontinue her Lasix as I think she is now euvolemic on examination. Resume 25 mg daily. Check potassium and renal function in one week. Add Norvasc 5 mg daily. Continue to increase as needed.

## 2011-01-05 NOTE — Progress Notes (Signed)
HPI:65 year old female for followup of coronary artery disease. Echocardiogram in 2008 at [facility] showed normal LV function and no significant valve disease. Patient had a Myoview in March of 2012 which showed anterior ischemia. Subsequent cardiac catheterization revealed an ejection fraction of 40%, and significant LAD/obtuse marginal disease. The patient underwent coronary artery bypass and graft in March 2012 with a LIMA to the LAD, a sequential saphenous vein graft to the diagonal and obtuse marginal and a saphenous vein graft to the acute marginal. Note TSH low. I last saw her in April of 2012. Since then, she has some residual chest soreness and mild dyspnea on exertion. There is no orthopnea, pedal edema or syncope.   Current Outpatient Prescriptions  Medication Sig Dispense Refill  . aspirin 325 MG tablet Take 325 mg by mouth daily.        . Calcium Carb-Cholecalciferol (CALCIUM 1000 + D PO) Take by mouth daily.        . calcium carbonate 200 MG capsule Take 250 mg by mouth 2 (two) times daily with a meal.        . Cholecalciferol (VITAMIN D3) 1000 UNITS CAPS 2 tabs po qd       . furosemide (LASIX) 40 MG tablet Take 40 mg by mouth 2 (two) times daily.        Marland Kitchen KLOR-CON M20 20 MEQ tablet 1 tablet Daily.      Marland Kitchen levothyroxine (SYNTHROID, LEVOTHROID) 88 MCG tablet Take 88 mcg by mouth daily.        . metoprolol tartrate (LOPRESSOR) 25 MG tablet Take 25 mg by mouth 2 (two) times daily.        . Multiple Vitamin (MULTIVITAMIN) capsule Take 1 capsule by mouth daily.        Marland Kitchen oxycodone (OXY-IR) 5 MG capsule Take 5 mg by mouth every 4 (four) hours as needed.        . pravastatin (PRAVACHOL) 80 MG tablet Take 1 tablet (80 mg total) by mouth daily.  30 tablet  12  . ramipril (ALTACE) 10 MG capsule Take 1 capsule (10 mg total) by mouth daily.  30 capsule  12     Past Medical History  Diagnosis Date  . Coronary artery disease   . Hypertension   . Hyperlipidemia   . Hypothyroidism   .  Osteoarthritis   . Gastroesophageal reflux disease   . GERD 09/07/2007  . OSTEOARTHRITIS 09/07/2007  . DISC DISEASE, CERVICAL 03/20/2009  . OSTEOPENIA 09/07/2007  . COLONIC POLYPS, HX OF 09/07/2007    Past Surgical History  Procedure Date  . Coronary artery bypass and graft     LIMA to the LAD, saphenous vein graft to the acute marginal, saphenous vein graft to the diagonal and obtuse marginal.  . Abdominal hysterectomy   . Thyroidectomy   . Tonsillectomy   . Carpal tunnel release     History   Social History  . Marital Status: Single    Spouse Name: N/A    Number of Children: 2  . Years of Education: N/A   Occupational History  . [employer]    Social History Main Topics  . Smoking status: Former Smoker -- 0.8 packs/day for 45 years    Types: Cigarettes    Quit date: 11/05/2010  . Smokeless tobacco: Never Used  . Alcohol Use: No  . Drug Use: No  . Sexually Active: Not on file   Other Topics Concern  . Not on file   Social History Narrative  .  No narrative on file    ROS: no fevers or chills, productive cough, hemoptysis, dysphasia, odynophagia, melena, hematochezia, dysuria, hematuria, rash, seizure activity, orthopnea, PND, pedal edema, claudication. Remaining systems are negative.  Physical Exam: Well-developed well-nourished in no acute distress.  Skin is warm and dry.  HEENT is normal.  Neck is supple. No thyromegaly.  Chest is clear to auscultation with normal expansion. Sternotomy without evidence of infection. Cardiovascular exam is regular rate and rhythm.  Abdominal exam nontender or distended. No masses palpated. Extremities show no edema. neuro grossly intact

## 2011-01-05 NOTE — H&P (Signed)
[name], [name] NO.:  [id number]   MEDICAL RECORD NO.:  [id number]          PATIENT TYPE:  EMS   LOCATION:  MAJO                         FACILITY:  [facility]   PHYSICIAN:  Michiel Cowboy, MDDATE OF BIRTH:  09-27-1945   DATE OF ADMISSION:  02/21/2009  DATE OF DISCHARGE:                              HISTORY & PHYSICAL   PRIMARY Evelen [name]:  Dr. Oliver Barre.   CHIEF COMPLAINT:  Right arm pain, as well as chest pain.   The patient is a 65 year old current smoker with family history of early  onset coronary artery disease, who presents with about a day or so of  right arm pain shooting through the neck and right shoulder down to the  tip of her fingers.  This is not worse with rotation of the neck or  movement of the neck.  The patient does have bulging disks and arthritis  of her neck per MRI done on October 09, 2008.  While she was having  this arm pain, she also developed chest pain started around 11:00 p.m.  and ended around 2:00 a.m., in which she felt kind of like indigestion.  This was relieved by burping, but did have some nausea and maybe some  shortness of breath.  This was non-radiational.  She usually does not  get chest pain or shortness of breath.  This patient presented to the  emergency department where MRI of her neck was ordered but has not been  done yet.  Cardiac markers, EKG and chest x-ray unremarkable.  [facility]  hospitalist called for an admission.   REVIEW OF SYSTEMS:  Negative except for HPI.   PAST MEDICAL HISTORY:  1. Hypercholesteremia.  2. Hypertension.   SOCIAL HISTORY:  The patient continues to smoke.  Does not drink, does  not abuse drugs.   FAMILY HISTORY:  Significant for mother who died from coronary artery  disease in her 54s.   ALLERGIES:  NO KNOWN DRUG ALLERGIES.   MEDICATIONS:  1. Levothyroxine 200 mcg daily night.  2. Zocor 40 mg daily.  3. Hydrochlorothiazide 25 mg daily.  4. Aspirin 81 mg daily.  5. Norvasc  5 mg daily.   PHYSICAL EXAMINATION:  VITAL SIGNS:  Temperature 97.0, blood pressure  137/76, pulse 85, respirations 22.  Saturating 99% on room air.  GENERAL:  The patient is currently chest pain free, alert, no acute  distress.  HEENT:  Head nontraumatic.  Moist mucous membranes.  LUNGS:  Clear to auscultation bilaterally.  HEART:  Regular rate and rhythm.  No murmurs appreciated.  NECK:  No neck tenderness.  No worsening of the pain with neck  manipulation.  SKIN:  There is a large discoloration of her left upper back that  appears to be a birthmark.  ABDOMEN:  Soft, nontender, nondistended.  LOWER EXTREMITIES:  Without clubbing, cyanosis or edema.  NEUROLOGIC:  The patient is intact.  Strength 5/5 in all extremities.   LABORATORY DATA:  White blood cell count 9.1, hemoglobin 14.8, sodium  144, potassium 4.2, creatinine 0.8.  Cardiac enzymes and I-Stats are  negative.  EKG showed normal  sinus rhythm.  No ischemic changes.  Chest  x-ray unremarkable.   ASSESSMENT AND PLAN:  This is a 65 year old female with risk factors and  atypical chest pain.   1. Chest pain.  We will cycle cardiac markers, serial EKGs, further      risk stratify with fasting lipid panel, hemoglobin A1c and TSH.  2. History of hypertension.  Continue Norvasc and hydrochlorothiazide.  3. Neck pain.  MRI of her neck already ordered.  We will need to      follow up on the results.  Most likely, this is radiculopathy as      the patient has a known history of degenerative disk disease in her      neck and this could have progressed since her last MRI, and      possibly become more symptomatic.  4. Prophylaxis.  Protonix plus Lovenox.      Michiel Cowboy, MD  Electronically Signed     AVD/MEDQ  D:  02/21/2009  T:  02/21/2009  Job:  [id number]   cc:   Corwin Levins, MD

## 2011-01-05 NOTE — Assessment & Plan Note (Signed)
Continue aspirin, statin, ACE inhibitor and beta blocker. Plan repeat echocardiogram in 3 months. Reduced LV function previously may have been secondary to coronary disease or hypertension.

## 2011-01-05 NOTE — Patient Instructions (Addendum)
Your physician wants you to follow-up in: 6 months You will receive a reminder letter in the mail two months in advance. If you don't receive a letter, please call our office to schedule the follow-up appointment.   STOP FUROSEMIDE  RESTART HCTZ 25 MG ONCE DAILY  RESTART AMLODIPINE 5 MG ONCE DAILY  Your physician recommends that you return for lab work in: ONE WEEK FASTING=LIPID-LIVER-BMP  Your physician has requested that you have an echocardiogram. Echocardiography is a painless test that uses sound waves to create images of your heart. It provides your doctor with information about the size and shape of your heart and how well your heart's chambers and valves are working. This procedure takes approximately one hour. There are no restrictions for this procedure.SCHEDULE IN 3 MONTHS

## 2011-01-08 NOTE — Op Note (Signed)
[facility]  Patient:    [name], [name]                     MRN: [id number] Proc. Date: 12/01/00 Adm. Date:  [id number] Attending:  Bonnetta Barry CC:         Tera Mater. Evlyn Kanner, M.D.  Robert P. Merla Riches, M.D.   Operative Report  PREOPERATIVE DIAGNOSIS:  Toxic multinodular goiter, primary hyperparathyroidism.  POSTOPERATIVE DIAGNOSIS: 1. Toxic multinodular goiter. 2. Parathyroid adenoma, left inferior gland.  OPERATION/PROCEDURE: 1. Total thyroidectomy. 2. Parathyroidectomy, left inferior gland.  SURGEON:  Velora Heckler, M.D.  ASSISTANT:  Sheppard Plumber. Earlene Plater, M.D.  ANESTHESIA:  General.  ESTIMATED BLOOD LOSS:  Minimal.  PREPARATION:  Betadine.  COMPLICATIONS:  None.  INDICATIONS:  Patient is a 65 year old black female who presents at the request  of Dr. Adrian Prince with toxic multinodular goiter.  She had undergone radioactive iodine ablation in 1999.  The patient had developed compressive symptoms with tightness in the neck and occasional solid food dysphagia.  The patient also had developed hypercalcemia.  Laboratory studies showed an elevated serum calcium of 11.1 with a PTH level of 129.  The patient now comes to surgery for total thyroidectomy and parathyroid exploration.  BODY OF REPORT:  Procedures done in OR #1 at the [facility].  The patient was brought to the operating room and placed in the supine position on the operating room table.  Following the administration of general anesthesia the patient is prepped and draped in the strict aseptic fashion.  After ascertaining that an adequate level of anesthesia had been obtained, a standard Kocher incision is made with the #10 blade.  Dissection was carried down through the subcutaneous tissues and platysma.  Skin flaps were developed cephalad and caudad from the thyroid notch to the sternal notch.  A ______ self-retaining retractor is placed for  exposures.  Strap muscles are incised in the midline.  Dissection is begun on the left side.  Strap muscles are elevated and reflected laterally.  There is some adhesion of the gland to the surrounding muscles consistent with their history of previous radiation treatment.  Venous tributaries are divided between small and medium Ligaclips.  The gland is rolled anteriorly.  Superior parathyroid gland is immediately identified. Inferior pole venous tributaries are divided between small Ligaclips. Superior pole vessels are mobilized and divided between hemostats, ligated with 2-0 silk ties.  Recurrent laryngeal nerve is identified and preserved. Gland is dissected out down to the ligament of Berry.  The ligament of Allyson Sabal is transected and the gland rolled anteriorly up and onto the anterior surface of the trachea.  There is no significant pyramidal lobe present.  Next, we turned our attention to the right side of the neck.  Again, strap muscles are reflected laterally.  Venous tributaries are divided between small and medium Ligaclips.  Superior pole vessels are divided between hemostats and ligated with 2-0 silk ties.  The gland is rolled anteriorly.  Recurrent laryngeal nerve is identified and preserved.  A tubercle of thyroid tissue extends near the ligament of Berry.  This is carefully dissected away from the recurrent laryngeal nerve and the nerve is preserved.  Branches of the inferior thyroid artery are divided between small Ligaclips.  Inferior parathyroid gland is identified and preserved on its vascular pedicle. Superior parathyroid gland is likewise identified above the level of the recurrent laryngeal nerve and is also preserved on its vascular pedicle.  The  ligament of Allyson Sabal is transected and the gland rolled up and onto the anterior surface of the trachea and completely incised.  It is submitted in toto to pathology for review for permanent sectioning.  The neck is then  explored further.  Two normal parathyroid glands are confirmed in the right neck.  A superior parathyroid gland in the left neck is confirmed as normal.  On dissection, in the left inferior neck a firm, slightly enlarged gland consistent with parathyroid tissue is identified. It is dissected out and its vascular pedicle divided between small Ligaclips. Gland is submitted to pathology for review and on frozen section, Dr. Jimmy Picket confirms that this is parathyroid tissue containing a nodule with histologic features consistent with parathyroid adenoma.  Good hemostasis is noted on both sides of the neck.  Surgicel is placed over the area of the recurrent laryngeal nerve bilaterally.  Strap muscles are reapproximated in the midline with interrupted 3-0 Vicryl sutures.  Platysma is reapproximated with interrupted 3-0 Vicryl sutures.  Skin edges were reapproximated with widely-spaced, stainless-steel staples and interspaced 1/2-inch Steri-Strips, and benzoin.  A sterile dressing is applied.  The patient is awakened from anesthesia and brought to the recovery room in stable condition.  The patient tolerated the procedure well. DD:  12/01/00 TD:  12/01/00 Job: [id number]

## 2011-01-13 ENCOUNTER — Other Ambulatory Visit (INDEPENDENT_AMBULATORY_CARE_PROVIDER_SITE_OTHER): Payer: Medicare Other | Admitting: *Deleted

## 2011-01-13 DIAGNOSIS — Z79899 Other long term (current) drug therapy: Secondary | ICD-10-CM

## 2011-01-13 DIAGNOSIS — E7800 Pure hypercholesterolemia, unspecified: Secondary | ICD-10-CM

## 2011-01-13 LAB — HEPATIC FUNCTION PANEL
ALT: 11 U/L (ref 0–35)
AST: 17 U/L (ref 0–37)
Albumin: 3.3 g/dL — ABNORMAL LOW (ref 3.5–5.2)
Alkaline Phosphatase: 57 U/L (ref 39–117)
Bilirubin, Direct: 0.1 mg/dL (ref 0.0–0.3)
Total Bilirubin: 0.6 mg/dL (ref 0.3–1.2)
Total Protein: 6.4 g/dL (ref 6.0–8.3)

## 2011-01-13 LAB — LIPID PANEL
Cholesterol: 136 mg/dL (ref 0–200)
HDL: 58 mg/dL (ref >39.00)
LDL Cholesterol: 66 mg/dL (ref 0–99)
Total CHOL/HDL Ratio: 2
Triglycerides: 58 mg/dL (ref 0.0–149.0)
VLDL: 11.6 mg/dL (ref 0.0–40.0)

## 2011-01-27 ENCOUNTER — Other Ambulatory Visit: Payer: Medicare Other | Admitting: *Deleted

## 2011-02-02 ENCOUNTER — Encounter: Payer: Self-pay | Admitting: Internal Medicine

## 2011-02-02 ENCOUNTER — Ambulatory Visit (INDEPENDENT_AMBULATORY_CARE_PROVIDER_SITE_OTHER): Payer: Medicare Other | Admitting: Internal Medicine

## 2011-02-02 DIAGNOSIS — Z8679 Personal history of other diseases of the circulatory system: Secondary | ICD-10-CM | POA: Insufficient documentation

## 2011-02-02 DIAGNOSIS — I10 Essential (primary) hypertension: Secondary | ICD-10-CM

## 2011-02-02 DIAGNOSIS — J449 Chronic obstructive pulmonary disease, unspecified: Secondary | ICD-10-CM | POA: Insufficient documentation

## 2011-02-02 DIAGNOSIS — D649 Anemia, unspecified: Secondary | ICD-10-CM | POA: Insufficient documentation

## 2011-02-02 DIAGNOSIS — G4700 Insomnia, unspecified: Secondary | ICD-10-CM | POA: Insufficient documentation

## 2011-02-02 DIAGNOSIS — Z0000 Encounter for general adult medical examination without abnormal findings: Secondary | ICD-10-CM

## 2011-02-02 MED ORDER — ZOLPIDEM TARTRATE 5 MG PO TABS: 5.0000 mg | ORAL_TABLET | Freq: Every evening | ORAL | Status: DC | PRN

## 2011-02-02 MED ORDER — TIOTROPIUM BROMIDE MONOHYDRATE 18 MCG IN CAPS: 18.0000 ug | ORAL_CAPSULE | Freq: Every day | RESPIRATORY_TRACT | Status: DC

## 2011-02-02 MED ORDER — AMLODIPINE BESYLATE 10 MG PO TABS: 10.0000 mg | ORAL_TABLET | Freq: Every day | ORAL | Status: DC

## 2011-02-02 NOTE — Assessment & Plan Note (Signed)
Improved, but still uncontrolled  - to incr the amlod to 10 qd, has f/u appt with Dr Crenshaw/card approx 2 mo, pt warned of potential side effect of pedal edema

## 2011-02-02 NOTE — Progress Notes (Signed)
Subjective:    Patient ID: [name], female    DOB: [birth year], 65 y.o.   MRN: [id number]  HPI  Here to f/u;  Overall doing ok;  Pt denies chest pain, increased sob or doe, wheezing, orthopnea, PND, increased LE swelling, palpitations, dizziness or syncope.  Pt denies new neurological symptoms such as new headache, or facial or extremity weakness or numbness   Pt denies polydipsia, polyuria, or low sugar symptoms such as weakness or confusion improved with po intake.  Pt states overall good compliance with meds, trying to follow lower cholesterol, diabetic diet, wt overall stable but little exercise however.     Denies worsening depressive symptoms, suicidal ideation, or panic, though has ongoing anxiety, not increased recently, and has increased insomnia with difficulty getting to sleep.  Overall good compliance with treatment, and good medicine tolerability.  Past Medical History  Diagnosis Date  . Coronary artery disease   . Hypertension   . Hyperlipidemia   . Hypothyroidism   . Osteoarthritis   . Gastroesophageal reflux disease   . GERD 09/07/2007  . OSTEOARTHRITIS 09/07/2007  . DISC DISEASE, CERVICAL 03/20/2009  . OSTEOPENIA 09/07/2007  . COLONIC POLYPS, HX OF 09/07/2007  . COPD (chronic obstructive pulmonary disease) 02/02/2011  . Anemia 02/02/2011  . History of rheumatic fever 02/02/2011  . Insomnia 02/02/2011   Past Surgical History  Procedure Date  . Coronary artery bypass and graft     LIMA to the LAD, saphenous vein graft to the acute marginal, saphenous vein graft to the diagonal and obtuse marginal.  . Abdominal hysterectomy   . Thyroidectomy   . Tonsillectomy   . Carpal tunnel release     reports that she quit smoking about 2 months ago. Her smoking use included Cigarettes. She has a 36 pack-year smoking history. She has never used smokeless tobacco. She reports that she does not drink alcohol or use illicit drugs. family history includes Arthritis in her other; Diabetes  in her other; Heart disease in her other; and Hypertension in her other. No Known Allergies Current Outpatient Prescriptions on File Prior to Visit  Medication Sig Dispense Refill  . aspirin 325 MG tablet Take 325 mg by mouth daily.        . calcium carbonate 200 MG capsule Take 250 mg by mouth 2 (two) times daily with a meal.        . Cholecalciferol (VITAMIN D3) 1000 UNITS CAPS 2 tabs po qd       . hydrochlorothiazide (,MICROZIDE/HYDRODIURIL,) 12.5 MG capsule Take 2 capsules (25 mg total) by mouth daily.  30 capsule  11  . levothyroxine (SYNTHROID, LEVOTHROID) 88 MCG tablet Take 88 mcg by mouth daily.        . metoprolol tartrate (LOPRESSOR) 25 MG tablet Take 25 mg by mouth 2 (two) times daily.        . Multiple Vitamin (MULTIVITAMIN) capsule Take 1 capsule by mouth daily.        Marland Kitchen oxycodone (OXY-IR) 5 MG capsule Take 5 mg by mouth every 4 (four) hours as needed.        . pravastatin (PRAVACHOL) 80 MG tablet Take 1 tablet (80 mg total) by mouth daily.  30 tablet  12  . ramipril (ALTACE) 10 MG capsule Take 1 capsule (10 mg total) by mouth daily.  30 capsule  12  . DISCONTD: amLODipine (NORVASC) 5 MG tablet Take 1 tablet (5 mg total) by mouth daily.  30 tablet  11  . Calcium  Carb-Cholecalciferol (CALCIUM 1000 + D PO) Take by mouth daily.        Marland Kitchen KLOR-CON M20 20 MEQ tablet 1 tablet Daily.       Review of Systems All otherwise neg per pt     Objective:   Physical Exam BP 152/90  Pulse 70  Temp(Src) 98.3 F (36.8 C) (Oral)  Ht 5\' 2"  (1.575 m)  Wt 115 lb (52.164 kg)  BMI 21.03 kg/m2  SpO2 98% Physical Exam  VS noted Constitutional: Pt appears well-developed and well-nourished.  HENT: Head: Normocephalic.  Right Ear: External ear normal.  Left Ear: External ear normal.  Eyes: Conjunctivae and EOM are normal. Pupils are equal, round, and reactive to light.  Neck: Normal range of motion. Neck supple.  Cardiovascular: Normal rate and regular rhythm.   Pulmonary/Chest: Effort normal  and breath sounds normal.  Abd:  Soft, NT, non-distended, + BS Neurological: Pt is alert. No cranial nerve deficit.  Skin: Skin is warm. No erythema.  Psychiatric: Pt behavior is normal. Thought content normal.         Assessment & Plan:

## 2011-02-02 NOTE — Assessment & Plan Note (Addendum)
Unclear severity, quit smoking, has some DOE, has echo pending per Dr Jens Som, will consider PFT's  But for now try trial of spiriva, and mucinex otc prn for congestion

## 2011-02-02 NOTE — Patient Instructions (Signed)
Take all new medications as prescribed - the spiriva inhaler, and ambien generic for sleep Increase the amlodipine to 10 mg per day Continue all other medications as before Please keep your appointments with your specialists as you have planned - Dr Jens Som Please return in 6 mo with Lab testing done 3-5 days before

## 2011-02-02 NOTE — Assessment & Plan Note (Signed)
Mild, denies depressive symptoms, for ambien prn,  to f/u any worsening symptoms or concerns

## 2011-02-16 ENCOUNTER — Other Ambulatory Visit: Payer: Self-pay

## 2011-02-16 MED ORDER — METOPROLOL TARTRATE 25 MG PO TABS: 25.0000 mg | ORAL_TABLET | Freq: Two times a day (BID) | ORAL | Status: DC

## 2011-02-17 ENCOUNTER — Encounter: Payer: Self-pay | Admitting: Internal Medicine

## 2011-03-08 ENCOUNTER — Other Ambulatory Visit (HOSPITAL_COMMUNITY): Payer: Medicare Other | Admitting: Radiology

## 2011-03-11 ENCOUNTER — Ambulatory Visit (INDEPENDENT_AMBULATORY_CARE_PROVIDER_SITE_OTHER): Payer: Medicare Other | Admitting: Internal Medicine

## 2011-03-11 ENCOUNTER — Encounter: Payer: Self-pay | Admitting: Internal Medicine

## 2011-03-11 VITALS — BP 126/70 | HR 79 | Temp 98.7°F | Ht 62.0 in | Wt 120.5 lb

## 2011-03-11 DIAGNOSIS — K219 Gastro-esophageal reflux disease without esophagitis: Secondary | ICD-10-CM

## 2011-03-11 DIAGNOSIS — K5900 Constipation, unspecified: Secondary | ICD-10-CM

## 2011-03-11 DIAGNOSIS — F419 Anxiety disorder, unspecified: Secondary | ICD-10-CM

## 2011-03-11 DIAGNOSIS — F411 Generalized anxiety disorder: Secondary | ICD-10-CM

## 2011-03-11 MED ORDER — ALPRAZOLAM 0.25 MG PO TABS: 0.2500 mg | ORAL_TABLET | Freq: Two times a day (BID) | ORAL | Status: DC | PRN

## 2011-03-11 MED ORDER — OMEPRAZOLE 20 MG PO CPDR: 20.0000 mg | DELAYED_RELEASE_CAPSULE | Freq: Two times a day (BID) | ORAL | Status: DC

## 2011-03-11 NOTE — Assessment & Plan Note (Signed)
midl overall worsening, possibly worse with peppermints - to stop this;  Gave 1 mo prilosec otc 20 bid;  Could be element of gastritis as well - to stop the [facility] powders as well,  Consider GI eval if not improved

## 2011-03-11 NOTE — Assessment & Plan Note (Signed)
For miralax daily prn

## 2011-03-11 NOTE — Assessment & Plan Note (Signed)
Mild worsening situational, ok for xanax bid prn ,  to f/u any worsening symptoms or concerns

## 2011-03-11 NOTE — Progress Notes (Signed)
Subjective:    Patient ID: [name], female    DOB: 08-17-1946, 65 y.o.   MRN: [id number]  HPI Here with c/o ongoing stress, feeling overwhelmed recently more due to trying to help arrange daughter's wedding in [place], as well as apprehensive about upcoming sched'd cardiac ischemic eval in august, worried if she can actually make the trip.  Denies worsening depressive symptoms, suicidal ideation, or panic, though has ongoing anxiety as above.  Has recurring stress headaches for which she has been using increasingly freq BC powders which has helped , except now also with  New upper abd discomfort and nausea, trying to take peppermints to help but not really working, in fact makes the heartburn and overall discomfort worse;  More burping as well all since 6 days, all worse at night, and more constipated recently as well.  On top of all that has worsening insomni with diffictuly getting to sleep, mild to mod, over the past few wks.  Not sueing the [place], made her feel funny.   Past Medical History  Diagnosis Date  . Coronary artery disease   . Hypertension   . Hyperlipidemia   . Hypothyroidism   . Osteoarthritis   . Gastroesophageal reflux disease   . GERD 09/07/2007  . OSTEOARTHRITIS 09/07/2007  . DISC DISEASE, CERVICAL 03/20/2009  . OSTEOPENIA 09/07/2007  . COLONIC POLYPS, HX OF 09/07/2007  . COPD (chronic obstructive pulmonary disease) 02/02/2011  . Anemia 02/02/2011  . History of rheumatic fever 02/02/2011  . Insomnia 02/02/2011   Past Surgical History  Procedure Date  . Coronary artery bypass and graft     LIMA to the LAD, saphenous vein graft to the acute marginal, saphenous vein graft to the diagonal and obtuse marginal.  . Abdominal hysterectomy   . Thyroidectomy   . Tonsillectomy   . Carpal tunnel release     reports that she quit smoking about 4 months ago. Her smoking use included Cigarettes. She has a 36 pack-year smoking history. She has never used smokeless tobacco. She  reports that she does not drink alcohol or use illicit drugs. family history includes Arthritis in her other; Diabetes in her other; Heart disease in her other; and Hypertension in her other. No Known Allergies Current Outpatient Prescriptions on File Prior to Visit  Medication Sig Dispense Refill  . amLODipine (NORVASC) 10 MG tablet Take 1 tablet (10 mg total) by mouth daily.  30 tablet  11  . aspirin 325 MG tablet Take 325 mg by mouth daily.        . Calcium Carb-Cholecalciferol (CALCIUM 1000 + D PO) Take by mouth daily.        . calcium carbonate 200 MG capsule Take 250 mg by mouth 2 (two) times daily with a meal.        . Cholecalciferol (VITAMIN D3) 1000 UNITS CAPS 2 tabs po qd       . hydrochlorothiazide (,MICROZIDE/HYDRODIURIL,) 12.5 MG capsule Take 2 capsules (25 mg total) by mouth daily.  30 capsule  11  . KLOR-CON M20 20 MEQ tablet 1 tablet Daily.      Marland Kitchen levothyroxine (SYNTHROID, LEVOTHROID) 88 MCG tablet Take 88 mcg by mouth daily.        . metoprolol tartrate (LOPRESSOR) 25 MG tablet Take 1 tablet (25 mg total) by mouth 2 (two) times daily.  60 tablet  11  . Multiple Vitamin (MULTIVITAMIN) capsule Take 1 capsule by mouth daily.        . pravastatin (PRAVACHOL)  80 MG tablet Take 1 tablet (80 mg total) by mouth daily.  30 tablet  12  . ramipril (ALTACE) 10 MG capsule Take 1 capsule (10 mg total) by mouth daily.  30 capsule  12    Review of Systems Review of Systems  Constitutional: Negative for diaphoresis and unexpected weight change.  HENT: Negative for drooling and tinnitus.   Eyes: Negative for photophobia and visual disturbance.  Respiratory: Negative for choking and stridor.   Gastrointestinal: Negative for vomiting and blood in stool.  Genitourinary: Negative for hematuria and decreased urine volume.  Musculoskeletal: Negative for gait problem.  Skin: Negative for color change and wound.  Neurological: Negative for tremors and numbness.  Psychiatric/Behavioral: Negative  for decreased concentration. The patient is not hyperactive.       Objective:   Physical Exam BP 126/70  Pulse 79  Temp(Src) 98.7 F (37.1 C) (Oral)  Ht 5\' 2"  (1.575 m)  Wt 120 lb 8 oz (54.658 kg)  BMI 22.04 kg/m2  SpO2 96% Physical Exam  VS noted, not ill appearing Constitutional: Pt appears well-developed and well-nourished.  HENT: Head: Normocephalic.  Right Ear: External ear normal.  Left Ear: External ear normal.  Eyes: Conjunctivae and EOM are normal. Pupils are equal, round, and reactive to light.  Neck: Normal range of motion. Neck supple.  Cardiovascular: Normal rate and regular rhythm.   Pulmonary/Chest: Effort normal and breath sounds normal.  Abd:  Soft, NT, non-distended, + BS Neurological: Pt is alert. No cranial nerve deficit. motor/dtr intact Skin: Skin is warm. No erythema.  Psychiatric: Pt behavior is normal. Thought content normal. 2+ nervous        Assessment & Plan:

## 2011-03-11 NOTE — Patient Instructions (Addendum)
Please stop the [facility] Powders You can take tylenol Extra strength for the occasional headache, but please avoid using this on a regular everyday basis Please take the prilosec samples at 20 mg twice per day for one month, then stop Take all new medications as prescribed - the alprazolam as needed for stress Please keep your appointments with your specialists as you have planned Dr Jens Som and heart testing for August 2012 Continue all other medications as before Please return in 5 mo  (Dec 2012) with Lab testing done 3-5 days before

## 2011-03-26 ENCOUNTER — Encounter: Payer: Self-pay | Admitting: Internal Medicine

## 2011-03-26 ENCOUNTER — Ambulatory Visit (INDEPENDENT_AMBULATORY_CARE_PROVIDER_SITE_OTHER): Payer: Medicare Other | Admitting: Internal Medicine

## 2011-03-26 ENCOUNTER — Other Ambulatory Visit (INDEPENDENT_AMBULATORY_CARE_PROVIDER_SITE_OTHER): Payer: Medicare Other

## 2011-03-26 ENCOUNTER — Other Ambulatory Visit (INDEPENDENT_AMBULATORY_CARE_PROVIDER_SITE_OTHER): Payer: Medicare Other | Admitting: Internal Medicine

## 2011-03-26 VITALS — BP 150/72 | HR 93 | Temp 99.2°F | Ht 63.0 in

## 2011-03-26 DIAGNOSIS — R1032 Left lower quadrant pain: Secondary | ICD-10-CM

## 2011-03-26 DIAGNOSIS — Z0000 Encounter for general adult medical examination without abnormal findings: Secondary | ICD-10-CM

## 2011-03-26 LAB — URINALYSIS, ROUTINE W REFLEX MICROSCOPIC
Bilirubin Urine: NEGATIVE
Ketones, ur: NEGATIVE
Nitrite: NEGATIVE
Specific Gravity, Urine: 1.025 (ref 1.000–1.030)
Total Protein, Urine: 100
Urine Glucose: NEGATIVE
Urobilinogen, UA: 0.2 (ref 0.0–1.0)
pH: 6 (ref 5.0–8.0)

## 2011-03-26 LAB — BASIC METABOLIC PANEL
BUN: 11 mg/dL (ref 6–23)
CO2: 28 mEq/L (ref 19–32)
Calcium: 8.7 mg/dL (ref 8.4–10.5)
Chloride: 106 mEq/L (ref 96–112)
Creatinine, Ser: 0.6 mg/dL (ref 0.4–1.2)
GFR: 119.1 mL/min (ref >60.00)
Glucose, Bld: 91 mg/dL (ref 70–99)
Potassium: 3.5 mEq/L (ref 3.5–5.1)
Sodium: 143 mEq/L (ref 135–145)

## 2011-03-26 LAB — CBC WITH DIFFERENTIAL/PLATELET
Basophils Absolute: 0.1 10*3/uL (ref 0.0–0.1)
Basophils Relative: 1 % (ref 0.0–3.0)
Eosinophils Absolute: 0.1 10*3/uL (ref 0.0–0.7)
Eosinophils Relative: 2.2 % (ref 0.0–5.0)
HCT: 36.5 % (ref 36.0–46.0)
Hemoglobin: 12.3 g/dL (ref 12.0–15.0)
Lymphocytes Relative: 26.9 % (ref 12.0–46.0)
Lymphs Abs: 1.8 10*3/uL (ref 0.7–4.0)
MCHC: 33.8 g/dL (ref 30.0–36.0)
MCV: 84.9 fl (ref 78.0–100.0)
Monocytes Absolute: 0.5 10*3/uL (ref 0.1–1.0)
Monocytes Relative: 7.1 % (ref 3.0–12.0)
Neutro Abs: 4.3 10*3/uL (ref 1.4–7.7)
Neutrophils Relative %: 62.8 % (ref 43.0–77.0)
Platelets: 192 10*3/uL (ref 150.0–400.0)
RBC: 4.3 Mil/uL (ref 3.87–5.11)
RDW: 16 % — ABNORMAL HIGH (ref 11.5–14.6)
WBC: 6.8 10*3/uL (ref 4.5–10.5)

## 2011-03-26 MED ORDER — CIPROFLOXACIN HCL 500 MG PO TABS: 500.0000 mg | ORAL_TABLET | Freq: Two times a day (BID) | ORAL | Status: AC

## 2011-03-26 MED ORDER — METRONIDAZOLE 500 MG PO TABS: 500.0000 mg | ORAL_TABLET | Freq: Three times a day (TID) | ORAL | Status: AC

## 2011-03-26 NOTE — Progress Notes (Signed)
Subjective:    Patient ID: [name], female    DOB: May 08, 1946, 65 y.o.   MRN: [id number]  HPI  complains of LLQ pain Onset 48h ago - progressively worse since onset Current pain 7-8/10, constant Pain radiates from LLQ to L flank - pain exac with direct pressure to Lower abd or movement/change in position Describes as "ache and deep cramp" No dysuria or hematuria - no similar pain inn past associated with with nausea and constipation, no vomit and no diarrhea and no fever Denies travel or change in meds No trauma to abd or back - no falls  Past Medical History  Diagnosis Date  . Coronary artery disease   . Hyperlipidemia   . History of rheumatic fever   . Anemia   . OSTEOPENIA   . Insomnia   . COLONIC POLYPS, HX OF   . Hypothyroidism   . Hypertension   . Gastroesophageal reflux disease   . COPD (chronic obstructive pulmonary disease)   . Osteoarthritis   . DISC DISEASE, CERVICAL   . Anxiety      Review of Systems  Constitutional: Negative for chills, fatigue and unexpected weight change.  Respiratory: Negative for cough.   Cardiovascular: Negative for chest pain and leg swelling.  Gastrointestinal: Negative for abdominal distention.  Genitourinary: Negative for difficulty urinating.  Musculoskeletal: Negative for back pain and joint swelling.       Objective:   Physical Exam BP 150/72  Pulse 93  Temp(Src) 99.2 F (37.3 C) (Oral)  Ht 5\' 3"  (1.6 m)  SpO2 97%  Constitutional: She is oriented to person, place, and time. She appears well-developed and well-nourished. Uncomfortable, bent forward in chair.  HENT: Head: Normocephalic and atraumatic. Ears; B TMs ok, no erythema or effusion; Nose: Nose normal.  Mouth/Throat: Oropharynx is clear and moist. No oropharyngeal exudate.  Eyes: Conjunctivae and EOM are normal. Pupils are equal, round, and reactive to light. No scleral icterus.  Neck: Normal range of motion. Neck supple. No JVD present. No thyromegaly  present.  Cardiovascular: Normal rate, regular rhythm and normal heart sounds.  No murmur heard. No BLE edema. +bruits L>R LQ Pulmonary/Chest: Effort normal and breath sounds normal. No respiratory distress. She has no wheezes.  Abdominal: Soft. Bowel sounds are normal. She exhibits no distension. There is mild reproducible tenderness to palp over LLQ, no rebound or gaurding.  Musculoskeletal: Normal range of motion B hips, no peripheral joint effusions - arms or legs. No gross deformities - no pain to palpation over lumbar vertebra or sacrum Neurological: She is alert and oriented to person, place, and time. No cranial nerve deficit. Coordination normal. MAE 5/5 Skin: Skin is warm and dry. No rash or bruise noted. No erythema. no shingles Psychiatric: She has a normal mood and affect. Her behavior is normal. Judgment and thought content normal.   Lab Results  Component Value Date   WBC 11.0* 11/16/2010   HGB 9.7* 11/16/2010   HCT 28.5* 11/16/2010   PLT 143* 11/16/2010   CHOL 136 01/13/2011   TRIG 58.0 01/13/2011   HDL 58.00 01/13/2011   ALT 11 01/13/2011   AST 17 01/13/2011   NA 144 12/15/2010   K 4.3 12/15/2010   CL 108 12/15/2010   CREATININE 0.8 12/15/2010   BUN 16 12/15/2010   CO2 28 12/15/2010   TSH 0.056* 11/12/2010   INR 1.45 11/13/2010   HGBA1C  Value: 5.9 (NOTE) The ADA recommends the following therapeutic goal for glycemic control related to Hgb  A1c measurement: Goal of therapy: <6.5 Hgb A1c  Reference: American Diabetes Association: Clinical Practice Recommendations 2010, Diabetes Care, 2010, 33: (Suppl  1). 02/21/2009      Assessment & Plan:  LLQ pain, acute - appears consistent with sigmoid diverticulitis on hx/exam, HD stable and afebrile but uncomfortable - check labs including UA rule out PN/UTI or stone; start empiric antibiotics and arrange for CT a/p to eval same - pt has hydrocodone at home for pain control as needed - agrees to go to ER if symptoms worse over weekend or while  awaiting OP testing

## 2011-03-26 NOTE — Patient Instructions (Signed)
It was good to see you today. Test(s) ordered today. Your results will be called to you after review (48-72hours after test completion). If any changes need to be made, you will be notified at that time. we'll make referral for CT scan to look for colon infection or other problems. Our office will contact you regarding appointment(s) once made. In meanwhile, start antibiotics cipro and flagyl - Your prescription(s) have been submitted to your pharmacy. Please take as directed and contact our office if you believe you are having problem(s) with the medication(s). Use your hydrocodone for pain as needed if your symptoms continue to worsen (pain, fever, etc), or if you are unable take anything by mouth (pills, fluids, etc), you should go to the emergency room for further evaluation and treatment.

## 2011-03-29 ENCOUNTER — Ambulatory Visit
Admission: RE | Admit: 2011-03-29 | Discharge: 2011-03-29 | Disposition: A | Discharge status: Home / Self Care | Payer: Medicare Other | Source: Ambulatory Visit | Attending: Internal Medicine | Admitting: Internal Medicine

## 2011-03-29 DIAGNOSIS — R1032 Left lower quadrant pain: Secondary | ICD-10-CM

## 2011-03-29 MED ORDER — IOHEXOL 300 MG/ML  SOLN
100.0000 mL | Freq: Once | INTRAMUSCULAR | Status: AC | PRN
  Administered 2011-03-29: 100 mL via INTRAVENOUS

## 2011-04-05 ENCOUNTER — Ambulatory Visit (HOSPITAL_COMMUNITY): Payer: Medicare Other | Attending: Internal Medicine | Admitting: Radiology

## 2011-04-05 DIAGNOSIS — I2510 Atherosclerotic heart disease of native coronary artery without angina pectoris: Secondary | ICD-10-CM | POA: Insufficient documentation

## 2011-04-05 DIAGNOSIS — E785 Hyperlipidemia, unspecified: Secondary | ICD-10-CM | POA: Insufficient documentation

## 2011-04-05 DIAGNOSIS — R0989 Other specified symptoms and signs involving the circulatory and respiratory systems: Secondary | ICD-10-CM | POA: Insufficient documentation

## 2011-04-05 DIAGNOSIS — R0609 Other forms of dyspnea: Secondary | ICD-10-CM | POA: Insufficient documentation

## 2011-04-05 DIAGNOSIS — I10 Essential (primary) hypertension: Secondary | ICD-10-CM | POA: Insufficient documentation

## 2011-04-05 DIAGNOSIS — Z87891 Personal history of nicotine dependence: Secondary | ICD-10-CM | POA: Insufficient documentation

## 2011-04-05 DIAGNOSIS — K219 Gastro-esophageal reflux disease without esophagitis: Secondary | ICD-10-CM | POA: Insufficient documentation

## 2011-04-05 DIAGNOSIS — I253 Aneurysm of heart: Secondary | ICD-10-CM | POA: Insufficient documentation

## 2011-06-01 ENCOUNTER — Encounter: Payer: Self-pay | Admitting: Cardiology

## 2011-06-01 ENCOUNTER — Ambulatory Visit (INDEPENDENT_AMBULATORY_CARE_PROVIDER_SITE_OTHER): Payer: Medicare Other | Admitting: Cardiology

## 2011-06-01 VITALS — BP 129/86 | HR 78 | Ht 62.5 in | Wt 126.0 lb

## 2011-06-01 DIAGNOSIS — I2510 Atherosclerotic heart disease of native coronary artery without angina pectoris: Secondary | ICD-10-CM

## 2011-06-01 LAB — DIFFERENTIAL
Basophils Absolute: 0
Basophils Relative: 1
Eosinophils Absolute: 0.2
Eosinophils Relative: 3
Lymphocytes Relative: 30
Lymphs Abs: 1.7
Monocytes Absolute: 0.4
Monocytes Relative: 7
Neutro Abs: 3.4
Neutrophils Relative %: 60

## 2011-06-01 LAB — URINALYSIS, ROUTINE W REFLEX MICROSCOPIC
Bilirubin Urine: NEGATIVE
Glucose, UA: NEGATIVE
Ketones, ur: NEGATIVE
Nitrite: NEGATIVE
Protein, ur: NEGATIVE
Specific Gravity, Urine: 1.018
Urobilinogen, UA: 1
pH: 7

## 2011-06-01 LAB — CBC
HCT: 41.4
Hemoglobin: 14.2
MCHC: 34.4
MCV: 89.4
Platelets: 248
RBC: 4.63
RDW: 13.7
WBC: 5.7

## 2011-06-01 LAB — COMPREHENSIVE METABOLIC PANEL
ALT: 14
AST: 18
Albumin: 3.4 — ABNORMAL LOW
Alkaline Phosphatase: 82
BUN: 10
CO2: 33 — ABNORMAL HIGH
Calcium: 9.1
Chloride: 103
Creatinine, Ser: 0.7
GFR calc Af Amer: >60
GFR calc non Af Amer: >60
Glucose, Bld: 91
Potassium: 3.9
Sodium: 141
Total Bilirubin: 0.7
Total Protein: 6.7

## 2011-06-01 LAB — D-DIMER, QUANTITATIVE (NOT AT ARMC): D-Dimer, Quant: 0.42

## 2011-06-01 LAB — URINE MICROSCOPIC-ADD ON

## 2011-06-01 LAB — LIPASE, BLOOD: Lipase: 15

## 2011-06-01 NOTE — Patient Instructions (Signed)
Your physician wants you to follow-up in:  12 months.  You will receive a reminder letter in the mail two months in advance. If you don't receive a letter, please call our office to schedule the follow-up appointment.   

## 2011-06-01 NOTE — Assessment & Plan Note (Signed)
Plan continue aspirin and statin. Continue beta blocker and ACE inhibitor. LV function improved by most recent echo.

## 2011-06-01 NOTE — Assessment & Plan Note (Signed)
Blood pressure controlled. Continue present medications. 

## 2011-06-01 NOTE — Assessment & Plan Note (Signed)
Continue statin. 

## 2011-06-01 NOTE — Progress Notes (Signed)
[name] female for followup of coronary artery disease. Echocardiogram in 2008 at [facility] showed normal LV function and no significant valve disease. Patient had a Myoview in March of 2012 which showed anterior ischemia. Subsequent cardiac catheterization revealed an ejection fraction of 40%, and significant LAD/obtuse marginal disease. The patient underwent coronary artery bypass and graft in March 2012 with a LIMA to the LAD, a sequential saphenous vein graft to the diagonal and obtuse marginal and a saphenous vein graft to the acute marginal. Note TSH low. Fu echo in August of 2012 showed an EF of 50-55, atrial septal aneurysm. I last saw her in May of 2012. Since then, the patient has dyspnea with more extreme activities but not with routine activities. It is relieved with rest. It is not associated with chest pain. There is no orthopnea, PND or pedal edema. There is no syncope or palpitations. There is no exertional chest pain.   Current Outpatient Prescriptions  Medication Sig Dispense Refill  . amLODipine (NORVASC) 10 MG tablet Take 1 tablet (10 mg total) by mouth daily.  30 tablet  11  . aspirin 325 MG tablet Take 325 mg by mouth daily.        . Calcium Carb-Cholecalciferol (CALCIUM 1000 + D PO) Take by mouth daily.        . hydrochlorothiazide (,MICROZIDE/HYDRODIURIL,) 12.5 MG capsule Take 2 capsules (25 mg total) by mouth daily.  30 capsule  11  . KLOR-CON M20 20 MEQ tablet 1 tablet Daily.      Marland Kitchen levothyroxine (SYNTHROID, LEVOTHROID) 88 MCG tablet Take 88 mcg by mouth daily.        . metoprolol tartrate (LOPRESSOR) 25 MG tablet Take 1 tablet (25 mg total) by mouth 2 (two) times daily.  60 tablet  11  . Multiple Vitamin (MULTIVITAMIN) capsule Take 1 capsule by mouth daily.        Marland Kitchen omeprazole (PRILOSEC) 20 MG capsule Take 1 capsule (20 mg total) by mouth 2 (two) times daily.  60 capsule  0  . pravastatin (PRAVACHOL) 80 MG tablet Take 1 tablet (80 mg total) by mouth daily.  30 tablet   12  . ramipril (ALTACE) 10 MG capsule Take 1 capsule (10 mg total) by mouth daily.  30 capsule  12     Past Medical History  Diagnosis Date  . Coronary artery disease   . Hyperlipidemia   . History of rheumatic fever   . Anemia   . OSTEOPENIA   . Insomnia   . COLONIC POLYPS, HX OF   . Hypothyroidism   . Hypertension   . Gastroesophageal reflux disease   . COPD (chronic obstructive pulmonary disease)   . Osteoarthritis   . DISC DISEASE, CERVICAL   . Anxiety     Past Surgical History  Procedure Date  . Coronary artery bypass and graft     LIMA to the LAD, saphenous vein graft to the acute marginal, saphenous vein graft to the diagonal and obtuse marginal.  . Abdominal hysterectomy   . Thyroidectomy   . Tonsillectomy   . Carpal tunnel release     History   Social History  . Marital Status: Single    Spouse Name: N/A    Number of Children: 2  . Years of Education: N/A   Occupational History  . [employer]    Social History Main Topics  . Smoking status: Former Smoker -- 0.8 packs/day for 45 years    Types: Cigarettes    Quit date: 11/05/2010  .  Smokeless tobacco: Never Used  . Alcohol Use: No  . Drug Use: No  . Sexually Active: Not on file   Other Topics Concern  . Not on file   Social History Narrative  . No narrative on file    ROS: Problems with insomnia but no fevers or chills, productive cough, hemoptysis, dysphasia, odynophagia, melena, hematochezia, dysuria, hematuria, rash, seizure activity, orthopnea, PND, pedal edema, claudication. Remaining systems are negative.  Physical Exam: Well-developed well-nourished in no acute distress.  Skin is warm and dry.  HEENT is normal.  Neck is supple. No thyromegaly.  Chest is clear to auscultation with normal expansion. S/p sternotomy Cardiovascular exam is regular rate and rhythm.  Abdominal exam nontender or distended. No masses palpated. Extremities show no edema. neuro grossly intact  ECG in sinus  rhythm at a rate of 83. Occasional PAC. Cannot rule out prior septal infarct. Nonspecific ST changes.

## 2011-06-09 LAB — COMPREHENSIVE METABOLIC PANEL
ALT: 17
AST: 20
Albumin: 3.2 — ABNORMAL LOW
Alkaline Phosphatase: 85
BUN: 11
CO2: 23
Calcium: 8.8
Chloride: 108
Creatinine, Ser: 0.73
GFR calc Af Amer: >60
GFR calc non Af Amer: >60
Glucose, Bld: 99
Potassium: 3.8
Sodium: 141
Total Bilirubin: 0.5
Total Protein: 6.4

## 2011-06-09 LAB — CBC
HCT: 41.3
Hemoglobin: 14
MCHC: 33.9
MCV: 89.3
Platelets: 271
RBC: 4.62
RDW: 14.7 — ABNORMAL HIGH
WBC: 6.5

## 2011-06-09 LAB — DIFFERENTIAL
Basophils Absolute: 0
Basophils Relative: 1
Eosinophils Absolute: 0.3
Eosinophils Relative: 5
Lymphocytes Relative: 29
Lymphs Abs: 1.9
Monocytes Absolute: 0.4
Monocytes Relative: 6
Neutro Abs: 3.9
Neutrophils Relative %: 60

## 2011-06-09 LAB — PROTIME-INR
INR: 0.9
Prothrombin Time: 12.7

## 2011-07-01 ENCOUNTER — Ambulatory Visit: Payer: Medicare Other | Admitting: Cardiology

## 2011-08-24 HISTORY — PX: CATARACT EXTRACTION: SUR2

## 2011-08-25 ENCOUNTER — Ambulatory Visit (INDEPENDENT_AMBULATORY_CARE_PROVIDER_SITE_OTHER): Payer: Medicare Other

## 2011-08-25 DIAGNOSIS — L258 Unspecified contact dermatitis due to other agents: Secondary | ICD-10-CM

## 2011-09-20 ENCOUNTER — Telehealth: Payer: Self-pay

## 2011-09-20 DIAGNOSIS — Z0000 Encounter for general adult medical examination without abnormal findings: Secondary | ICD-10-CM

## 2011-09-20 NOTE — Telephone Encounter (Signed)
Put order in for physical labs. 

## 2011-10-11 ENCOUNTER — Other Ambulatory Visit: Payer: Self-pay | Admitting: Internal Medicine

## 2011-10-28 ENCOUNTER — Ambulatory Visit (INDEPENDENT_AMBULATORY_CARE_PROVIDER_SITE_OTHER): Payer: Medicare Other | Admitting: Internal Medicine

## 2011-10-28 ENCOUNTER — Encounter: Payer: Self-pay | Admitting: Internal Medicine

## 2011-10-28 VITALS — BP 122/70 | HR 76 | Temp 97.2°F | Ht 62.0 in | Wt 136.1 lb

## 2011-10-28 DIAGNOSIS — Z0000 Encounter for general adult medical examination without abnormal findings: Secondary | ICD-10-CM

## 2011-10-28 DIAGNOSIS — I2510 Atherosclerotic heart disease of native coronary artery without angina pectoris: Secondary | ICD-10-CM

## 2011-10-28 DIAGNOSIS — I10 Essential (primary) hypertension: Secondary | ICD-10-CM

## 2011-10-28 DIAGNOSIS — K219 Gastro-esophageal reflux disease without esophagitis: Secondary | ICD-10-CM

## 2011-10-28 DIAGNOSIS — Z01818 Encounter for other preprocedural examination: Secondary | ICD-10-CM

## 2011-10-28 MED ORDER — LEVOTHYROXINE SODIUM 75 MCG PO TABS: 75.0000 ug | ORAL_TABLET | Freq: Every day | ORAL | Status: DC

## 2011-10-28 MED ORDER — PRAVASTATIN SODIUM 80 MG PO TABS: 80.0000 mg | ORAL_TABLET | Freq: Every day | ORAL | Status: DC

## 2011-10-28 MED ORDER — HYDROCHLOROTHIAZIDE 25 MG PO TABS: 25.0000 mg | ORAL_TABLET | Freq: Every day | ORAL | Status: DC

## 2011-10-28 MED ORDER — AMLODIPINE BESYLATE 10 MG PO TABS: 10.0000 mg | ORAL_TABLET | Freq: Every day | ORAL | Status: DC

## 2011-10-28 MED ORDER — LANSOPRAZOLE 30 MG PO CPDR: 30.0000 mg | DELAYED_RELEASE_CAPSULE | Freq: Every day | ORAL | Status: DC

## 2011-10-28 MED ORDER — RAMIPRIL 10 MG PO CAPS: 10.0000 mg | ORAL_CAPSULE | Freq: Every day | ORAL | Status: DC

## 2011-10-28 MED ORDER — METOPROLOL TARTRATE 25 MG PO TABS: 25.0000 mg | ORAL_TABLET | Freq: Two times a day (BID) | ORAL | Status: DC

## 2011-10-28 NOTE — Assessment & Plan Note (Signed)
stable overall by hx and exam, most recent data reviewed with pt, and pt to continue medical treatment as before, encouraged pt to take the altace daily as prescribed BP Readings from Last 3 Encounters:  10/28/11 122/70  06/01/11 129/86  03/26/11 150/72

## 2011-10-28 NOTE — Assessment & Plan Note (Signed)
stable overall by hx and exam, most recent data reviewed with pt, and pt to continue medical treatment as before  Lab Results  Component Value Date   WBC 6.8 03/26/2011   HGB 12.3 03/26/2011   HCT 36.5 03/26/2011   PLT 192.0 03/26/2011   GLUCOSE 91 03/26/2011   CHOL 136 01/13/2011   TRIG 58.0 01/13/2011   HDL 58.00 01/13/2011   LDLCALC 66 01/13/2011   ALT 11 01/13/2011   AST 17 01/13/2011   NA 143 03/26/2011   K 3.5 03/26/2011   CL 106 03/26/2011   CREATININE 0.6 03/26/2011   BUN 11 03/26/2011   CO2 28 03/26/2011   TSH 0.056* 11/12/2010   INR 1.45 11/13/2010   HGBA1C  Value: 5.9 (NOTE) The ADA recommends the following therapeutic goal for glycemic control related to Hgb A1c measurement: Goal of therapy: <6.5 Hgb A1c  Reference: American Diabetes Association: Clinical Practice Recommendations 2010, Diabetes Care, 2010, 33: (Suppl  1). 02/21/2009   To re-start the lopressor as before

## 2011-10-28 NOTE — Assessment & Plan Note (Signed)
D/w pt;  Information given, form signed, ok for left cataract surgury

## 2011-10-28 NOTE — Patient Instructions (Signed)
Take all new medications as prescribed - the generic for prevacid OK to stay off the Potassium pill for now, and stop the omeprazole OK to finish the HCTZ 12.5's for now, then change to the 25 mg pill as discussed We will try to get most recent lab results per Dr Evlyn Kanner  Continue all other medications as before, including the ramipril every day, and the metoprolol as prescribed All of your prescriptions were refilled to [facility] except for the thyroid medication per Dr Delora Fuel are given the form signed and the information as requested by your opthomologiswt Please keep your appointments with your specialists as you have planned - Cataract left (ok for surgury)

## 2011-10-28 NOTE — Progress Notes (Signed)
Subjective:    Patient ID: [name], female    DOB: October 25, 1945, 66 y.o.   MRN: [id number]  HPI  Here to f/u; overall doing ok,  Pt denies chest pain, increased sob or doe, wheezing, orthopnea, PND, increased LE swelling, palpitations, dizziness or syncope.  Pt denies new neurological symptoms such as new headache, or facial or extremity weakness or numbness   Pt denies polydipsia, polyuria  Pt states overall good compliance with meds except has not been taking the altace every day, and for some reason has gotten away from taking her K and lopressor.,  Is  trying to follow lower cholesterol diet, wt overall stable but little exercise however.  Had recent CABG 2012, with f/u card, with rec for further f/u at one yr.  Sees Dr South/endo - thyroid med decr to 75 qd , and mult labs done. prilosec bid not working for her reflux, especialy to lie down at night. Some confusion about her HCTZ, wants to go back to the 25 mg per day, instead of 12.5 x 2 per day.  Having left cataract out soon, needs form signed today for ok for surgury Past Medical History  Diagnosis Date  . Coronary artery disease   . Hyperlipidemia   . History of rheumatic fever   . Anemia   . OSTEOPENIA   . Insomnia   . COLONIC POLYPS, HX OF   . Hypothyroidism   . Hypertension   . Gastroesophageal reflux disease   . COPD (chronic obstructive pulmonary disease)   . Osteoarthritis   . DISC DISEASE, CERVICAL   . Anxiety    Past Surgical History  Procedure Date  . Coronary artery bypass and graft     LIMA to the LAD, saphenous vein graft to the acute marginal, saphenous vein graft to the diagonal and obtuse marginal.  . Abdominal hysterectomy   . Thyroidectomy   . Tonsillectomy   . Carpal tunnel release     reports that she quit smoking about a year ago. Her smoking use included Cigarettes. She has a 36 pack-year smoking history. She has never used smokeless tobacco. She reports that she does not drink alcohol or use  illicit drugs. family history includes Arthritis in her other; Diabetes in her other; Heart disease in her other; and Hypertension in her other. No Known Allergies Current Outpatient Prescriptions on File Prior to Visit  Medication Sig Dispense Refill  . amLODipine (NORVASC) 10 MG tablet Take 1 tablet (10 mg total) by mouth daily.  30 tablet  11  . aspirin 325 MG tablet Take 325 mg by mouth daily.        . Calcium Carb-Cholecalciferol (CALCIUM 1000 + D PO) Take by mouth daily.        . hydrochlorothiazide (,MICROZIDE/HYDRODIURIL,) 12.5 MG capsule Take 2 capsules (25 mg total) by mouth daily.  30 capsule  11  . hydrochlorothiazide (HYDRODIURIL) 25 MG tablet TAKE ONE TABLET BY MOUTH EVERY DAY  90 tablet  1  . levothyroxine (SYNTHROID, LEVOTHROID) 88 MCG tablet Take 88 mcg by mouth daily.        . Multiple Vitamin (MULTIVITAMIN) capsule Take 1 capsule by mouth daily.        Marland Kitchen omeprazole (PRILOSEC) 20 MG capsule Take 1 capsule (20 mg total) by mouth 2 (two) times daily.  60 capsule  0  . pravastatin (PRAVACHOL) 80 MG tablet Take 1 tablet (80 mg total) by mouth daily.  30 tablet  12  . ramipril (ALTACE)  10 MG capsule Take 1 capsule (10 mg total) by mouth daily.  30 capsule  12  . KLOR-CON M20 20 MEQ tablet 1 tablet Daily.      . metoprolol tartrate (LOPRESSOR) 25 MG tablet Take 1 tablet (25 mg total) by mouth 2 (two) times daily.  60 tablet  11   Review of Systems Review of Systems  Constitutional: Negative for diaphoresis and unexpected weight change.  HENT: Negative for drooling and tinnitus.   Eyes: Negative for photophobia and visual disturbance.  Respiratory: Negative for choking and stridor.   Gastrointestinal: Negative for vomiting and blood in stool.  Genitourinary: Negative for hematuria and decreased urine volume.    Objective:   Physical Exam BP 122/70  Pulse 76  Temp(Src) 97.2 F (36.2 C) (Oral)  Ht 5\' 2"  (1.575 m)  Wt 136 lb 2 oz (61.746 kg)  BMI 24.90 kg/m2  SpO2  98% Physical Exam  VS noted Constitutional: Pt appears well-developed and well-nourished.  HENT: Head: Normocephalic.  Right Ear: External ear normal.  Left Ear: External ear normal.  Eyes: Conjunctivae and EOM are normal. Pupils are equal, round, and reactive to light.  Neck: Normal range of motion. Neck supple.  Cardiovascular: Normal rate and regular rhythm.   Pulmonary/Chest: Effort normal and breath sounds normal.  Abd:  Soft, NT, non-distended, + BS Neurological: Pt is alert. No cranial nerve deficit.  Skin: Skin is warm. No erythema.  Psychiatric: Pt behavior is normal. Thought content normal.     Assessment & Plan:

## 2011-10-28 NOTE — Assessment & Plan Note (Signed)
To change to generic prevacid,  to f/u any worsening symptoms or concerns

## 2011-10-31 ENCOUNTER — Encounter: Payer: Self-pay | Admitting: Internal Medicine

## 2011-10-31 DIAGNOSIS — G9050 Complex regional pain syndrome I, unspecified: Secondary | ICD-10-CM

## 2011-10-31 DIAGNOSIS — E559 Vitamin D deficiency, unspecified: Secondary | ICD-10-CM | POA: Insufficient documentation

## 2011-10-31 DIAGNOSIS — I739 Peripheral vascular disease, unspecified: Secondary | ICD-10-CM | POA: Insufficient documentation

## 2011-10-31 HISTORY — DX: Complex regional pain syndrome I, unspecified: G90.50

## 2011-11-18 ENCOUNTER — Other Ambulatory Visit: Payer: Self-pay | Admitting: Cardiology

## 2011-11-18 DIAGNOSIS — I739 Peripheral vascular disease, unspecified: Secondary | ICD-10-CM

## 2011-11-23 ENCOUNTER — Encounter (INDEPENDENT_AMBULATORY_CARE_PROVIDER_SITE_OTHER): Payer: Medicare Other

## 2011-11-23 DIAGNOSIS — I70219 Atherosclerosis of native arteries of extremities with intermittent claudication, unspecified extremity: Secondary | ICD-10-CM

## 2011-11-23 DIAGNOSIS — I739 Peripheral vascular disease, unspecified: Secondary | ICD-10-CM

## 2011-12-01 ENCOUNTER — Encounter: Payer: Self-pay | Admitting: Cardiovascular Disease

## 2011-12-01 ENCOUNTER — Ambulatory Visit (INDEPENDENT_AMBULATORY_CARE_PROVIDER_SITE_OTHER): Payer: Medicare Other | Admitting: Cardiovascular Disease

## 2011-12-01 VITALS — BP 136/88 | HR 86 | Resp 12 | Ht 62.0 in | Wt 138.0 lb

## 2011-12-01 DIAGNOSIS — I739 Peripheral vascular disease, unspecified: Secondary | ICD-10-CM

## 2011-12-01 DIAGNOSIS — I779 Disorder of arteries and arterioles, unspecified: Secondary | ICD-10-CM

## 2011-12-01 NOTE — Patient Instructions (Addendum)
Your physician recommends that you schedule a follow-up appointment in: 2 months.  Your physician has recommended you make the following change in your medication: Start taking Omeprazole 20mg  daily

## 2011-12-03 ENCOUNTER — Encounter: Payer: Self-pay | Admitting: Cardiovascular Disease

## 2011-12-03 DIAGNOSIS — I739 Peripheral vascular disease, unspecified: Secondary | ICD-10-CM

## 2011-12-03 HISTORY — DX: Peripheral vascular disease, unspecified: I73.9

## 2011-12-03 NOTE — Assessment & Plan Note (Signed)
The patient was found to have an abnormal ABI and arterial Doppler recently. This was reviewed by me. The ABI was normal on the left side at 1.05. It was moderately reduced on the right side at 0.63. There was evidence of short occlusion in the distal right SFA. Her current symptoms include claudications which do not seem to be lifestyle limiting. She also has no history of critical limb ischemia or nonhealing ulcers. At this time I recommend continuing medical therapy and start an exercise program. It is actually difficult to be certain how symptomatic she is given that she hasn't really been doing significant physical activities. She is on good medical therapy for CAD which should help her PAD as well. I did discuss with her the possibility of proceeding with abdominal aortogram and lower extremity runoff with possible endovascular intervention. She prefers a more conservative approach at this time. I will reevaluate her symptoms in about 2 months from now. Her ABI is not significantly abnormal to the point of preventing reasonable wound healing if she is to undergo right toe surgery.

## 2011-12-03 NOTE — Progress Notes (Signed)
HPI  This is a 66 year old female who is here today for PV evaluation after a recently abnormal ABI study. The study was requested by Dr. Wynelle Cleveland as preop for right toe surgery. The patient has known history of coronary artery disease status post coronary artery bypass graft surgery last year. She has been stable from a cardiac standpoint. She does complain of recent heartburn especially at night.  She does describe bilateral calf discomfort worse on the right side but this really has not been limiting her physical activities. She is able to walk her normal activities without having to stop. She does get discomfort at rest occasionally but that does not seem to be related to her PAD. She has no history of nonhealing ulcers. She hasn't really been doing much physical exercise. It's not exactly clear to me what kind of toe surgery she would be undergoing.  No Known Allergies   Current Outpatient Prescriptions on File Prior to Visit  Medication Sig Dispense Refill  . amLODipine (NORVASC) 10 MG tablet Take 1 tablet (10 mg total) by mouth daily.  90 tablet  3  . aspirin 325 MG tablet Take 325 mg by mouth daily.        . Calcium Carb-Cholecalciferol (CALCIUM 1000 + D PO) Take by mouth daily.        . hydrochlorothiazide (HYDRODIURIL) 25 MG tablet Take 25 mg by mouth as needed.      . lansoprazole (PREVACID) 30 MG capsule Take 1 capsule (30 mg total) by mouth daily.  90 capsule  3  . levothyroxine (SYNTHROID, LEVOTHROID) 75 MCG tablet Take 1 tablet (75 mcg total) by mouth daily.  90 tablet  3  . metoprolol tartrate (LOPRESSOR) 25 MG tablet Take 1 tablet (25 mg total) by mouth 2 (two) times daily.  180 tablet  3  . Multiple Vitamin (MULTIVITAMIN) capsule Take 1 capsule by mouth daily.        . pravastatin (PRAVACHOL) 80 MG tablet Take 1 tablet (80 mg total) by mouth daily.  90 tablet  3  . ramipril (ALTACE) 10 MG capsule Take 1 capsule (10 mg total) by mouth daily.  90 capsule  3  . DISCONTD:  KLOR-CON M20 20 MEQ tablet 1 tablet Daily.      Marland Kitchen DISCONTD: omeprazole (PRILOSEC) 20 MG capsule Take 1 capsule (20 mg total) by mouth 2 (two) times daily.  60 capsule  0     Past Medical History  Diagnosis Date  . Coronary artery disease   . Hyperlipidemia   . History of rheumatic fever   . Anemia   . OSTEOPENIA   . Insomnia   . COLONIC POLYPS, HX OF   . Hypothyroidism   . Hypertension   . Gastroesophageal reflux disease   . COPD (chronic obstructive pulmonary disease)   . Osteoarthritis   . DISC DISEASE, CERVICAL   . Anxiety   . PVD (peripheral vascular disease) 10/31/2011  . Vitamin d deficiency   . RSD (reflex sympathetic dystrophy) 10/31/2011     Past Surgical History  Procedure Date  . Coronary artery bypass and graft     LIMA to the LAD, saphenous vein graft to the acute marginal, saphenous vein graft to the diagonal and obtuse marginal.  . Abdominal hysterectomy   . Thyroidectomy   . Tonsillectomy   . Carpal tunnel release      Family History  Problem Relation Age of Onset  . Arthritis Other   . Heart disease Other   .  Hypertension Other   . Diabetes Other      History   Social History  . Marital Status: Single    Spouse Name: N/A    Number of Children: 2  . Years of Education: N/A   Occupational History  . [employer]    Social History Main Topics  . Smoking status: Former Smoker -- 0.8 packs/day for 45 years    Types: Cigarettes    Quit date: 11/05/2010  . Smokeless tobacco: Never Used  . Alcohol Use: No  . Drug Use: No  . Sexually Active: Not on file   Other Topics Concern  . Not on file   Social History Narrative  . No narrative on file     PHYSICAL EXAM   BP 136/88  Pulse 86  Resp 12  Ht 5\' 2"  (1.575 m)  Wt 138 lb (62.596 kg)  BMI 25.24 kg/m2  Constitutional: She is oriented to person, place, and time. She appears well-developed and well-nourished. No distress.  HENT: No nasal discharge.  Head: Normocephalic and atraumatic.   Eyes: Pupils are equal and round. Right eye exhibits no discharge. Left eye exhibits no discharge.  Neck: Normal range of motion. Neck supple. No JVD present. No thyromegaly present.  Cardiovascular: Normal rate, regular rhythm, normal heart sounds. Exam reveals no gallop and no friction rub. No murmur heard.  Pulmonary/Chest: Effort normal and breath sounds normal. No stridor. No respiratory distress. She has no wheezes. She has no rales. She exhibits no tenderness.  Abdominal: Soft. Bowel sounds are normal. She exhibits no distension. There is no tenderness. There is no rebound and no guarding.  Musculoskeletal: Normal range of motion. She exhibits no edema and no tenderness.  Neurological: She is alert and oriented to person, place, and time. Coordination normal.  Skin: Skin is warm and dry. No rash noted. She is not diaphoretic. No erythema. No pallor.  Psychiatric: She has a normal mood and affect. Her behavior is normal. Judgment and thought content normal.  Vascular: Femoral pulses are normal bilaterally. DP/PT: Absent on the right side and faint on the left side.   EKG: Normal sinus rhythm with PVCs. Left atrial enlargement, poor R-wave progression in the anterior leads.   ASSESSMENT AND PLAN

## 2011-12-08 ENCOUNTER — Ambulatory Visit (INDEPENDENT_AMBULATORY_CARE_PROVIDER_SITE_OTHER): Payer: Medicare Other | Admitting: Family Medicine

## 2011-12-08 VITALS — BP 147/82 | HR 77 | Temp 98.3°F | Resp 16 | Ht 61.5 in | Wt 137.6 lb

## 2011-12-08 DIAGNOSIS — R079 Chest pain, unspecified: Secondary | ICD-10-CM

## 2011-12-08 DIAGNOSIS — B029 Zoster without complications: Secondary | ICD-10-CM

## 2011-12-08 MED ORDER — VALACYCLOVIR HCL 1 G PO TABS: 1000.0000 mg | ORAL_TABLET | Freq: Three times a day (TID) | ORAL | Status: DC

## 2011-12-08 NOTE — Progress Notes (Signed)
Patient Name: [name] Date of Birth: [birth year] Medical Record Number: [id number] Gender: female Date of Encounter: 12/08/2011  History of Present Illness:  JHORDAN KINTER is a 66 y.o. very pleasant female patient who presents with the following:  Notes that she has pain under her left breast and on her left side/ chest for the last 2 or 3 days. It hurts to wear a bra.  The skin does not feel tender to the touch.  No cough or fever.    Otherwise no particular symptoms currently. Allysson does have problems with constipation but this is chronic.  No fever or chills, stable occasional SOB especially with exertion.    Emery had a 4- bypass last year.  She states that she was recenly at her cardiologist's office and that they gave her a good report.  She quit smoking about one year ago.  She is exercising some, but states she is not walking as much as she would like due to balance problems.  It is difficult for her to give me details about her CP- she describes it as a "soreness" that may or may not be exacerbated by touching the skin.  Does not feel like pressure.  Does not seem to be exertional but she has a hard time giving me details.   This morning her pain was more severe while she was in bed- feels better now.  She describes it as" tenderness."  Shingles vaccine in the last couple of years per her PCP  Patient Active Problem List  Diagnoses  . HYPOTHYROIDISM  . HYPERLIPIDEMIA  . TOBACCO ABUSE  . HYPERTENSION  . ALLERGIC RHINITIS  . GERD  . CONSTIPATION  . OSTEOARTHRITIS  . DISC DISEASE, CERVICAL  . Pain in Soft Tissues of Limb  . OSTEOPENIA  . MURMUR  . COLONIC POLYPS, HX OF  . CAD  . Preventative health care  . COPD (chronic obstructive pulmonary disease)  . Anemia  . History of rheumatic fever  . Insomnia  . Anxiety  . Preop exam for internal medicine  . PVD (peripheral vascular disease)  . Vitamin d deficiency  . RSD (reflex sympathetic dystrophy)  . PAD  (peripheral artery disease)   Past Medical History  Diagnosis Date  . Coronary artery disease   . Hyperlipidemia   . History of rheumatic fever   . Anemia   . OSTEOPENIA   . Insomnia   . COLONIC POLYPS, HX OF   . Hypothyroidism   . Hypertension   . Gastroesophageal reflux disease   . COPD (chronic obstructive pulmonary disease)   . Osteoarthritis   . DISC DISEASE, CERVICAL   . Anxiety   . PVD (peripheral vascular disease) 10/31/2011  . Vitamin d deficiency   . RSD (reflex sympathetic dystrophy) 10/31/2011  . PAD (peripheral artery disease) 12/03/2011   Past Surgical History  Procedure Date  . Coronary artery bypass and graft     LIMA to the LAD, saphenous vein graft to the acute marginal, saphenous vein graft to the diagonal and obtuse marginal.  . Abdominal hysterectomy   . Thyroidectomy   . Tonsillectomy   . Carpal tunnel release    History  Substance Use Topics  . Smoking status: Former Smoker -- 0.8 packs/day for 45 years    Types: Cigarettes    Quit date: 11/05/2010  . Smokeless tobacco: Never Used  . Alcohol Use: No   Family History  Problem Relation Age of Onset  . Arthritis Other   .  Heart disease Other   . Hypertension Other   . Diabetes Other    No Known Allergies  Medication list has been reviewed and updated.  Review of Systems: As per HPI- otherwise negative.  Physical Examination: Filed Vitals:   12/08/11 0943  BP: 147/82  Pulse: 77  Temp: 98.3 F (36.8 C)  TempSrc: Oral  Resp: 16  Height: 5' 1.5" (1.562 m)  Weight: 137 lb 9.6 oz (62.415 kg)    Body mass index is 25.58 kg/(m^2). GEN: WDWN, NAD, Non-toxic, A & O x 3 HEENT: Atraumatic, Normocephalic. Neck supple. No masses, No LAD.  TM, oropharynx wnl Ears and Nose: No external deformity. CV: RRR, No M/G/R. No JVD. No thrill. No extra heart sounds. PULM: CTA B, no wheezes, crackles, rhonchi. No retractions. No resp. distress. No accessory muscle use. Chest wall:  There is the start of  a rash on her left side under and lateral to her left breast. It is not vesicular but may be consistent with early shingles.  It is tender to the touch especially laterally ABD: S, NT, ND, +BS. No rebound. No HSM. EXTR: No c/c/e NEURO Normal gait.  PSYCH: Normally interactive. Conversant. Not depressed or anxious appearing.  Calm demeanor.   EKG: compared to EKG from last year- no significant change.  Discussed with Dr. Ladona Ridgel at [facility] cardiology who agreed  Assessment and Plan: 1. Shingles  valACYclovir (VALTREX) 1000 MG tablet  2. Chest pain at rest  EKG 12-Lead   CP is likely due to mild singles- a milder case is common post- vaccination.  Treat with valtrex as above.  Although her pain is very atypical and EKG is reassuring, explained that I cannot guarantee that her pain is not cardiac in origin.  Offered ED evaluation and cardiac rule- out.  Vernice declined this for now but will seek care at the ED if she gets any worse.  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 Counseled to avoid pregnant women and young children as well.

## 2011-12-08 NOTE — Patient Instructions (Signed)

## 2011-12-09 ENCOUNTER — Telehealth: Payer: Self-pay

## 2011-12-09 MED ORDER — ACYCLOVIR 200 MG PO CAPS: ORAL_CAPSULE | ORAL | Status: DC

## 2011-12-09 NOTE — Telephone Encounter (Signed)
Change to acyclovir. Done.  Oslo Huntsman

## 2011-12-09 NOTE — Telephone Encounter (Signed)
PT STATES SHE WAS GIVEN SOME RX FOR SHINGLES AND IT IS TO EXPENSIVE, SHE CANNOT AFFORD $40.00. PLEASE CALL [phone]   [facility] ON [address]

## 2011-12-09 NOTE — Telephone Encounter (Signed)
Is there something cheaper.

## 2011-12-10 NOTE — Telephone Encounter (Signed)
Spoke with patient and let her know rx was called in.  Patient understood.

## 2012-02-02 ENCOUNTER — Ambulatory Visit: Payer: Medicare Other | Admitting: Cardiovascular Disease

## 2012-02-09 ENCOUNTER — Encounter: Payer: Self-pay | Admitting: Cardiovascular Disease

## 2012-02-09 ENCOUNTER — Ambulatory Visit (INDEPENDENT_AMBULATORY_CARE_PROVIDER_SITE_OTHER): Payer: Medicare Other | Admitting: Cardiovascular Disease

## 2012-02-09 VITALS — BP 126/74 | HR 75 | Ht 62.0 in | Wt 137.0 lb

## 2012-02-09 DIAGNOSIS — I739 Peripheral vascular disease, unspecified: Secondary | ICD-10-CM

## 2012-02-09 MED ORDER — CILOSTAZOL 100 MG PO TABS: 100.0000 mg | ORAL_TABLET | Freq: Two times a day (BID) | ORAL | Status: DC

## 2012-02-09 NOTE — Patient Instructions (Addendum)
Your physician wants you to follow-up in: 6 monhts.  You will receive a reminder letter in the mail two months in advance. If you don't receive a letter, please call our office to schedule the follow-up appointment.  Your physician has requested that you have an ankle brachial index (ABI) in 6 months prior to your appt with Dr Kirke Corin. During this test an ultrasound and blood pressure cuff are used to evaluate the arteries that supply the arms and legs with blood. Allow thirty minutes for this exam. There are no restrictions or special instructions.  Your physician has recommended you make the following change in your medication: Start Pletal 100mg  twice daily

## 2012-02-13 NOTE — Progress Notes (Signed)
HPI  This is a 66 year old female who is here today for a followup visit regarding PAD.  The patient has known history of coronary artery disease status post coronary artery bypass graft surgery last year. She has been stable from a cardiac standpoint. She does complain of recent heartburn especially at night.  She does describe bilateral calf discomfort worse on the right side but this really has not been limiting her physical activities. She is able to walk her normal activities without having to stop. She does get discomfort at rest occasionally but that does not seem to be related to her PAD. She has no history of nonhealing ulcers.  We discussed during last visit different management options and the patient elected for a more conservative approach. I recommended starting an exercise program. She is doing more walking now but does notice claudication more often in the right calf.  No Known Allergies   Current Outpatient Prescriptions on File Prior to Visit  Medication Sig Dispense Refill  . amLODipine (NORVASC) 10 MG tablet Take 1 tablet (10 mg total) by mouth daily.  90 tablet  3  . aspirin 325 MG tablet Take 325 mg by mouth daily.        Marland Kitchen atorvastatin (LIPITOR) 40 MG tablet Take 1 tablet by mouth daily.      . Calcium Carb-Cholecalciferol (CALCIUM 1000 + D PO) Take by mouth daily.        . hydrochlorothiazide (HYDRODIURIL) 25 MG tablet Take 25 mg by mouth as needed.      . metoprolol tartrate (LOPRESSOR) 25 MG tablet Take 1 tablet (25 mg total) by mouth 2 (two) times daily.  180 tablet  3  . Multiple Vitamin (MULTIVITAMIN) capsule Take 1 capsule by mouth daily.        Marland Kitchen omeprazole (PRILOSEC) 20 MG capsule Take 20 mg by mouth daily.      . potassium chloride SA (K-DUR,KLOR-CON) 20 MEQ tablet Take 20 mEq by mouth daily.      . ramipril (ALTACE) 10 MG capsule Take 1 capsule (10 mg total) by mouth daily.  90 capsule  3     Past Medical History  Diagnosis Date  . Coronary artery  disease   . Hyperlipidemia   . History of rheumatic fever   . Anemia   . OSTEOPENIA   . Insomnia   . COLONIC POLYPS, HX OF   . Hypothyroidism   . Hypertension   . Gastroesophageal reflux disease   . COPD (chronic obstructive pulmonary disease)   . Osteoarthritis   . DISC DISEASE, CERVICAL   . Anxiety   . PVD (peripheral vascular disease) 10/31/2011  . Vitamin d deficiency   . RSD (reflex sympathetic dystrophy) 10/31/2011  . PAD (peripheral artery disease) 12/03/2011     Past Surgical History  Procedure Date  . Coronary artery bypass and graft     LIMA to the LAD, saphenous vein graft to the acute marginal, saphenous vein graft to the diagonal and obtuse marginal.  . Abdominal hysterectomy   . Thyroidectomy   . Tonsillectomy   . Carpal tunnel release      Family History  Problem Relation Age of Onset  . Arthritis Other   . Heart disease Other   . Hypertension Other   . Diabetes Other      History   Social History  . Marital Status: Single    Spouse Name: N/A    Number of Children: 2  . Years of  Education: N/A   Occupational History  . [employer]    Social History Main Topics  . Smoking status: Former Smoker -- 0.8 packs/day for 45 years    Types: Cigarettes    Quit date: 11/05/2010  . Smokeless tobacco: Never Used  . Alcohol Use: No  . Drug Use: No  . Sexually Active: Not on file   Other Topics Concern  . Not on file   Social History Narrative  . No narrative on file      PHYSICAL EXAM   BP 126/74  Pulse 75  Ht 5\' 2"  (1.575 m)  Wt 137 lb (62.143 kg)  BMI 25.06 kg/m2  Constitutional: She is oriented to person, place, and time. She appears well-developed and well-nourished. No distress.  HENT: No nasal discharge.  Head: Normocephalic and atraumatic.  Eyes: Pupils are equal and round. Right eye exhibits no discharge. Left eye exhibits no discharge.  Neck: Normal range of motion. Neck supple. No JVD present. No thyromegaly present.    Cardiovascular: Normal rate, regular rhythm, normal heart sounds. Exam reveals no gallop and no friction rub. No murmur heard.  Pulmonary/Chest: Effort normal and breath sounds normal. No stridor. No respiratory distress. She has no wheezes. She has no rales. She exhibits no tenderness.  Abdominal: Soft. Bowel sounds are normal. She exhibits no distension. There is no tenderness. There is no rebound and no guarding.  Musculoskeletal: Normal range of motion. She exhibits no edema and no tenderness.  Neurological: She is alert and oriented to person, place, and time. Coordination normal.  Skin: Skin is warm and dry. No rash noted. She is not diaphoretic. No erythema. No pallor.  Psychiatric: She has a normal mood and affect. Her behavior is normal. Judgment and thought content normal.      ASSESSMENT AND PLAN

## 2012-02-13 NOTE — Assessment & Plan Note (Signed)
The patient has known history of PAD with evidence of short occlusion in the distal right SFA. Her current symptoms include claudications which do not seem to be lifestyle limiting. However, She is having more symptoms now after she increased her physical activities.  We again discussed  the possibility of proceeding with abdominal aortogram and lower extremity runoff with possible endovascular intervention. She still prefers a more conservative approach. I think that is reasonable at this time given that she has no rest pain or critical limb ischemia. I will start her on Pletal 100 mg twice daily. I advised her to continue her walking program and try to increase her walking distance gradually. I recommend a followup ABI in 6 months from now and followup with me shortly after that. She is to report to me any worsening claudication symptoms.

## 2012-02-15 ENCOUNTER — Telehealth: Payer: Self-pay | Admitting: Cardiovascular Disease

## 2012-02-15 NOTE — Telephone Encounter (Signed)
N/A.  LMTC. 

## 2012-02-15 NOTE — Telephone Encounter (Signed)
New problem:  Per Liborio Nixon calling , c/o right knee swollen. Pt start on new medication. cilostazol 100 mg.

## 2012-02-16 NOTE — Telephone Encounter (Signed)
Pt calling to report that she started having swelling in her right knee on Saturday.  She started the cilostazol 100 mg on Friday and is taking it as directed but  is concerned it is causing the swelling.  She states she stopped the medication yesterday.  She states she has a "knot" on the outer aspect of her knee and it hurts when she steps down on it.  She thinks it might be slightly warm to the touch.  Denies redness or discoloration. Swelling is mainly concentrated in her knee area and is slightly better today after using ice and elevation yesterday.  Please advise.     Pt wants to be contacted on cell [phone].

## 2012-02-16 NOTE — Telephone Encounter (Signed)
The knots are not likely to be due to Cilostazol. It can cause swelling. Stop the medication and let us know if it does not improve in few days.

## 2012-02-17 NOTE — Telephone Encounter (Signed)
Pt.notified

## 2012-02-17 NOTE — Telephone Encounter (Signed)
Pt was notified.  

## 2012-02-18 ENCOUNTER — Encounter: Payer: Self-pay | Admitting: Internal Medicine

## 2012-02-18 ENCOUNTER — Ambulatory Visit
Admission: RE | Admit: 2012-02-18 | Discharge: 2012-02-18 | Disposition: A | Discharge status: Home / Self Care | Payer: Medicare Other | Source: Ambulatory Visit | Attending: Internal Medicine | Admitting: Internal Medicine

## 2012-02-18 ENCOUNTER — Ambulatory Visit (INDEPENDENT_AMBULATORY_CARE_PROVIDER_SITE_OTHER): Payer: Medicare Other | Admitting: Internal Medicine

## 2012-02-18 VITALS — BP 138/80 | HR 85 | Temp 98.2°F | Resp 16 | Wt 137.5 lb

## 2012-02-18 DIAGNOSIS — M25561 Pain in right knee: Secondary | ICD-10-CM

## 2012-02-18 DIAGNOSIS — M25569 Pain in unspecified knee: Secondary | ICD-10-CM

## 2012-02-18 NOTE — Patient Instructions (Addendum)
Degenerative Arthritis  You have osteoarthritis. This is the wear and tear arthritis that comes with aging. It is also called degenerative arthritis. This is common in people past middle age. It is caused by stress on the joints. The large weight bearing joints of the lower extremities are most often affected. The knees, hips, back, neck, and hands can become painful, swollen, and stiff. This is the most common type of arthritis. It comes on with age, carrying too much weight, or from an injury.  Treatment includes resting the sore joint until the pain and swelling improve. Crutches or a walker may be needed for severe flares. Only take over-the-counter or prescription medicines for pain, discomfort, or fever as directed by your caregiver. Local heat therapy may improve motion. Cortisone shots into the joint are sometimes used to reduce pain and swelling during flares.  Osteoarthritis is usually not crippling and progresses slowly. There are things you can do to decrease pain:  · Avoid high impact activities.  · Exercise regularly.  · Low impact exercises such as walking, biking and swimming help to keep the muscles strong and keep normal joint function.  · Stretching helps to keep your range of motion.  · Lose weight if you are overweight. This reduces joint stress.  In severe cases when you have pain at rest or increasing disability, joint surgery may be helpful. See your caregiver for follow-up treatment as recommended.   SEEK IMMEDIATE MEDICAL CARE IF:   · You have severe joint pain.  · Marked swelling and redness in your joint develops.  · You develop a high fever.  Document Released: 08/09/2005 Document Revised: 07/29/2011 Document Reviewed: 01/09/2007  [employer]® Patient Information ©2012 [employer].

## 2012-02-20 ENCOUNTER — Encounter: Payer: Self-pay | Admitting: Internal Medicine

## 2012-02-20 MED ORDER — METHYLPREDNISOLONE ACETATE 40 MG/ML IJ SUSP: 40.0000 mg | Freq: Once | INTRAMUSCULAR | Status: DC

## 2012-02-20 NOTE — Assessment & Plan Note (Signed)
I think she DJD with a small effusion, the injection should help, I have asked her to have a knee xray done as well and will advise further if needed

## 2012-02-20 NOTE — Progress Notes (Signed)
  Subjective:    Patient ID: [name], female    DOB: 02-29-1946, 66 y.o.   MRN: [id number]  Arthritis Presents for initial visit. The disease course has been worsening. The condition has lasted for 1 week. She complains of pain, stiffness and joint swelling. She reports no joint warmth. Affected locations include the right knee. Her pain is at a severity of 3/10. Pertinent negatives include no diarrhea, dry mouth, dysuria, fatigue, fever, pain at night, pain while resting, rash or weight loss. Her past medical history is significant for osteoarthritis. Past treatments include acetaminophen. The treatment provided no relief. Factors aggravating her arthritis include activity. Compliance with prior treatments has been good.      Review of Systems  Constitutional: Negative.  Negative for fever, weight loss and fatigue.  HENT: Negative.   Eyes: Negative.   Respiratory: Negative.   Cardiovascular: Negative.   Gastrointestinal: Negative.  Negative for diarrhea.  Genitourinary: Negative.  Negative for dysuria.  Musculoskeletal: Positive for joint swelling, arthralgias (right knee), arthritis and stiffness.  Skin: Negative.  Negative for rash.  Neurological: Negative.   Hematological: Negative.   Psychiatric/Behavioral: Negative.        Objective:   Physical Exam  Vitals reviewed. Constitutional: She is oriented to person, place, and time. She appears well-developed and well-nourished. No distress.  HENT:  Head: Normocephalic and atraumatic.  Mouth/Throat: Oropharynx is clear and moist. No oropharyngeal exudate.  Eyes: Conjunctivae are normal. Right eye exhibits no discharge. Left eye exhibits no discharge. No scleral icterus.  Neck: Normal range of motion. Neck supple. No JVD present. No tracheal deviation present. No thyromegaly present.  Cardiovascular: Normal rate, regular rhythm, normal heart sounds and intact distal pulses.  Exam reveals no gallop and no friction rub.   No  murmur heard. Pulmonary/Chest: Effort normal and breath sounds normal. No stridor. No respiratory distress. She has no wheezes. She has no rales. She exhibits no tenderness.  Abdominal: Soft. Bowel sounds are normal. She exhibits no distension and no mass. There is no tenderness. There is no rebound and no guarding.  Musculoskeletal: Normal range of motion. She exhibits no edema.       Right knee: She exhibits swelling. She exhibits normal range of motion, no effusion, no ecchymosis, no deformity, no laceration, no erythema, normal alignment, no LCL laxity, normal patellar mobility, no bony tenderness, normal meniscus and no MCL laxity. tenderness found. Lateral joint line tenderness noted. No medial joint line, no MCL, no LCL and no patellar tendon tenderness noted.       Rt knee was prepped and drapes in sterile fashion and the skin was cleaned with betadine, local anesthesia was obtained with 2% lido with epi, then using a 1.5 in 27 gauge needle from the lateral approach the joint was entered, no fluid was obtained, the joint space was easily injected with 1/2 cc depo-medrol (40 mg) and 1/2 cc of plain lido, she tolerated the proc well  Lymphadenopathy:    She has no cervical adenopathy.  Neurological: She is oriented to person, place, and time.  Skin: Skin is warm and dry. No rash noted. She is not diaphoretic. No erythema. No pallor.  Psychiatric: She has a normal mood and affect. Her behavior is normal. Judgment and thought content normal.          Assessment & Plan:

## 2012-02-21 ENCOUNTER — Ambulatory Visit (INDEPENDENT_AMBULATORY_CARE_PROVIDER_SITE_OTHER)
Admission: RE | Admit: 2012-02-21 | Discharge: 2012-02-21 | Disposition: A | Discharge status: Home / Self Care | Payer: Medicare Other | Source: Ambulatory Visit | Attending: Internal Medicine | Admitting: Internal Medicine

## 2012-02-21 DIAGNOSIS — M25569 Pain in unspecified knee: Secondary | ICD-10-CM

## 2012-02-22 ENCOUNTER — Telehealth: Payer: Self-pay

## 2012-02-22 ENCOUNTER — Encounter: Payer: Self-pay | Admitting: Internal Medicine

## 2012-02-22 NOTE — Telephone Encounter (Signed)
Pt advised.

## 2012-02-22 NOTE — Telephone Encounter (Signed)
Pt called requesting results of xray completed 07/01

## 2012-02-22 NOTE — Telephone Encounter (Signed)
Mild arthritis

## 2012-04-27 ENCOUNTER — Ambulatory Visit: Payer: Medicare Other | Admitting: Internal Medicine

## 2012-06-22 ENCOUNTER — Ambulatory Visit: Payer: Medicare Other | Admitting: Cardiology

## 2012-06-23 ENCOUNTER — Ambulatory Visit (INDEPENDENT_AMBULATORY_CARE_PROVIDER_SITE_OTHER): Payer: Medicare Other | Admitting: Cardiology

## 2012-06-23 ENCOUNTER — Encounter: Payer: Self-pay | Admitting: Cardiology

## 2012-06-23 VITALS — BP 143/81 | HR 89 | Wt 139.0 lb

## 2012-06-23 DIAGNOSIS — I739 Peripheral vascular disease, unspecified: Secondary | ICD-10-CM

## 2012-06-23 DIAGNOSIS — I10 Essential (primary) hypertension: Secondary | ICD-10-CM

## 2012-06-23 DIAGNOSIS — E785 Hyperlipidemia, unspecified: Secondary | ICD-10-CM

## 2012-06-23 DIAGNOSIS — I2510 Atherosclerotic heart disease of native coronary artery without angina pectoris: Secondary | ICD-10-CM

## 2012-06-23 NOTE — Assessment & Plan Note (Signed)
Patient is having worsening claudication. Continue aspirin and statin. I will ask her to see Dr Kirke Corin and she will most likely need arteriogram/intervention.

## 2012-06-23 NOTE — Assessment & Plan Note (Signed)
Continue statin. Lipids and liver monitored by primary care. 

## 2012-06-23 NOTE — Assessment & Plan Note (Signed)
Continue aspirin and statin. 

## 2012-06-23 NOTE — Assessment & Plan Note (Signed)
Continue present blood pressure medications. Potassium and renal function monitored by primary care. 

## 2012-06-23 NOTE — Progress Notes (Signed)
HPI: Pleasant female for followup of coronary artery disease. Echocardiogram in 2008 at [facility] showed normal LV function and no significant valvular disease. Patient had a Myoview in March of 2012 which showed anterior ischemia. Subsequent cardiac catheterization revealed an ejection fraction of 40%, and significant LAD/obtuse marginal disease. The patient underwent coronary artery bypass and graft in March 2012 with a LIMA to the LAD, a sequential saphenous vein graft to the diagonal and obtuse marginal and a saphenous vein graft to the acute marginal. Fu echo in August of 2012 showed an EF of 50-55, atrial septal aneurysm. Patient was seen by Dr. Kirke Corin for claudication and abnormal ABIs in April of 2013. There was suggestion of occlusion of the distal right SFA. Her symptoms have not been particularly limiting and she is being treated medically. I last saw her in Oct of 2012. Since then, she has some dyspnea on exertion but no orthopnea, PND, pedal edema, palpitations, syncope or chest pain. She is having worsening right lower extremity claudication that is now limiting her activities.  Current Outpatient Prescriptions  Medication Sig Dispense Refill  . amLODipine (NORVASC) 10 MG tablet Take 1 tablet (10 mg total) by mouth daily.  90 tablet  3  . aspirin 325 MG tablet Take 325 mg by mouth daily.        Marland Kitchen atorvastatin (LIPITOR) 40 MG tablet Take 1 tablet by mouth daily.      . Calcium Carb-Cholecalciferol (CALCIUM 1000 + D PO) Take by mouth daily.        . hydrochlorothiazide (HYDRODIURIL) 25 MG tablet Take 25 mg by mouth as needed.      Marland Kitchen levothyroxine (SYNTHROID, LEVOTHROID) 88 MCG tablet Take 88 mcg by mouth daily.      . metoprolol tartrate (LOPRESSOR) 25 MG tablet Take 25 mg by mouth as needed.      . Multiple Vitamin (MULTIVITAMIN) capsule Take 1 capsule by mouth daily.        . potassium chloride SA (K-DUR,KLOR-CON) 20 MEQ tablet Take 20 mEq by mouth daily.      . ramipril (ALTACE) 10  MG capsule Take 10 mg by mouth as needed.      . Vitamin D, Ergocalciferol, (DRISDOL) 50000 UNITS CAPS Take 50,000 Units by mouth every 7 (seven) days.      Marland Kitchen DISCONTD: metoprolol tartrate (LOPRESSOR) 25 MG tablet Take 1 tablet (25 mg total) by mouth 2 (two) times daily.  180 tablet  3  . DISCONTD: ramipril (ALTACE) 10 MG capsule Take 1 capsule (10 mg total) by mouth daily.  90 capsule  3     Past Medical History  Diagnosis Date  . Coronary artery disease   . Hyperlipidemia   . History of rheumatic fever   . Anemia   . OSTEOPENIA   . Insomnia   . COLONIC POLYPS, HX OF   . Hypothyroidism   . Hypertension   . Gastroesophageal reflux disease   . COPD (chronic obstructive pulmonary disease)   . Osteoarthritis   . DISC DISEASE, CERVICAL   . Anxiety   . PVD (peripheral vascular disease) 10/31/2011  . Vitamin D deficiency   . RSD (reflex sympathetic dystrophy) 10/31/2011  . PAD (peripheral artery disease) 12/03/2011    Past Surgical History  Procedure Date  . Coronary artery bypass and graft     LIMA to the LAD, saphenous vein graft to the acute marginal, saphenous vein graft to the diagonal and obtuse marginal.  . Abdominal hysterectomy   .  Thyroidectomy   . Tonsillectomy   . Carpal tunnel release     History   Social History  . Marital Status: Single    Spouse Name: N/A    Number of Children: 2  . Years of Education: N/A   Occupational History  . [employer]    Social History Main Topics  . Smoking status: Former Smoker -- 0.8 packs/day for 45 years    Types: Cigarettes    Quit date: 11/05/2010  . Smokeless tobacco: Never Used  . Alcohol Use: No  . Drug Use: No  . Sexually Active: Not Currently   Other Topics Concern  . Not on file   Social History Narrative  . No narrative on file    ROS: no fevers or chills, productive cough, hemoptysis, dysphasia, odynophagia, melena, hematochezia, dysuria, hematuria, rash, seizure activity, orthopnea, PND, pedal edema,  claudication. Remaining systems are negative.  Physical Exam: Well-developed well-nourished in no acute distress.  Skin is warm and dry.  HEENT is normal.  Neck is supple.  Chest is clear to auscultation with normal expansion.  Cardiovascular exam is regular rate and rhythm.  Abdominal exam nontender or distended. No masses palpated. Extremities show no edema. neuro grossly intact  ECG sinus rhythm at a rate of 89. Right axis deviation. Occasional PACs. Nonspecific T-wave changes. Cannot rule out prior septal infarct.

## 2012-06-23 NOTE — Patient Instructions (Addendum)
Your physician wants you to follow-up in: ONE YEAR WITH DR Shelda Pal will receive a reminder letter in the mail two months in advance. If you don't receive a letter, please call our office to schedule the follow-up appointment.   SCHEDULE FOLLOW UP WITH DR ARIDA-CLAUDICATION

## 2012-07-05 ENCOUNTER — Ambulatory Visit: Payer: Medicare Other | Admitting: Cardiovascular Disease

## 2012-07-07 ENCOUNTER — Ambulatory Visit (INDEPENDENT_AMBULATORY_CARE_PROVIDER_SITE_OTHER): Payer: Medicare Other | Admitting: Family Medicine

## 2012-07-07 ENCOUNTER — Encounter: Payer: Self-pay | Admitting: Family Medicine

## 2012-07-07 VITALS — BP 149/80 | HR 76 | Temp 97.8°F | Resp 16 | Ht 62.5 in | Wt 138.0 lb

## 2012-07-07 DIAGNOSIS — L02519 Cutaneous abscess of unspecified hand: Secondary | ICD-10-CM

## 2012-07-07 DIAGNOSIS — L03119 Cellulitis of unspecified part of limb: Secondary | ICD-10-CM

## 2012-07-07 DIAGNOSIS — K219 Gastro-esophageal reflux disease without esophagitis: Secondary | ICD-10-CM

## 2012-07-07 MED ORDER — PANTOPRAZOLE SODIUM 40 MG PO TBEC: 40.0000 mg | DELAYED_RELEASE_TABLET | Freq: Every day | ORAL | Status: DC

## 2012-07-07 MED ORDER — CEPHALEXIN 500 MG PO CAPS: 500.0000 mg | ORAL_CAPSULE | Freq: Three times a day (TID) | ORAL | Status: DC

## 2012-07-07 NOTE — Progress Notes (Signed)
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  She fell one week ago and abraded her middle dorsal right hand MCP areas.  Despite peroxide, antibacterial soap, the fingers have been throbbing with increasing erythema as well as STS.  Objective: Mild dorsal right hand swelling with scabs over the 2nd and 3rd mcp joints and erythema and mild swelling Contractures obvious at MCP with lacking 40 degrees extension.  Surgical scars present  Assessment: cellulitis  Plan:  Keflex 500 mg tid x 7 days.   Patient also complaining of nausea for the past month.  She saw Dr. Evlyn Kanner recently and he recommended changing from lipitor to atorvastatin.  She also sees Dr. Jonny Ruiz and has a follow up appointment in a month.  She had been on Nexium.  She can no longer afford the Nexium.  Mildly tender diffusely in abdomen with some tenderness on LLQ as well.  No masses.   No HSM  1. Cellulitis of hand  cephALEXin (KEFLEX) 500 MG capsule  2. GERD (gastroesophageal reflux disease)  pantoprazole (PROTONIX) 40 MG tablet

## 2012-07-07 NOTE — Patient Instructions (Addendum)
Gastroesophageal Reflux Disease, Adult Gastroesophageal reflux disease (GERD) happens when acid from your stomach flows up into the esophagus. When acid comes in contact with the esophagus, the acid causes soreness (inflammation) in the esophagus. Over time, GERD may create small holes (ulcers) in the lining of the esophagus. CAUSES   Increased body weight. This puts pressure on the stomach, making acid rise from the stomach into the esophagus.  Smoking. This increases acid production in the stomach.  Drinking alcohol. This causes decreased pressure in the lower esophageal sphincter (valve or ring of muscle between the esophagus and stomach), allowing acid from the stomach into the esophagus.  Late evening meals and a full stomach. This increases pressure and acid production in the stomach.  A malformed lower esophageal sphincter. Sometimes, no cause is found. SYMPTOMS   Burning pain in the lower part of the mid-chest behind the breastbone and in the mid-stomach area. This may occur twice a week or more often.  Trouble swallowing.  Sore throat.  Dry cough.  Asthma-like symptoms including chest tightness, shortness of breath, or wheezing. DIAGNOSIS  Your caregiver may be able to diagnose GERD based on your symptoms. In some cases, X-rays and other tests may be done to check for complications or to check the condition of your stomach and esophagus. TREATMENT  Your caregiver may recommend over-the-counter or prescription medicines to help decrease acid production. Ask your caregiver before starting or adding any new medicines.  HOME CARE INSTRUCTIONS   Change the factors that you can control. Ask your caregiver for guidance concerning weight loss, quitting smoking, and alcohol consumption.  Avoid foods and drinks that make your symptoms worse, such as:  Caffeine or alcoholic drinks.  Chocolate.  Peppermint or mint flavorings.  Garlic and onions.  Spicy foods.  Citrus fruits,  such as oranges, lemons, or limes.  Tomato-based foods such as sauce, chili, salsa, and pizza.  Fried and fatty foods.  Avoid lying down for the 3 hours prior to your bedtime or prior to taking a nap.  Eat small, frequent meals instead of large meals.  Wear loose-fitting clothing. Do not wear anything tight around your waist that causes pressure on your stomach.  Raise the head of your bed 6 to 8 inches with wood blocks to help you sleep. Extra pillows will not help.  Only take over-the-counter or prescription medicines for pain, discomfort, or fever as directed by your caregiver.  Do not take aspirin, ibuprofen, or other nonsteroidal anti-inflammatory drugs (NSAIDs). SEEK IMMEDIATE MEDICAL CARE IF:   You have pain in your arms, neck, jaw, teeth, or back.  Your pain increases or changes in intensity or duration.  You develop nausea, vomiting, or sweating (diaphoresis).  You develop shortness of breath, or you faint.  Your vomit is green, yellow, black, or looks like coffee grounds or blood.  Your stool is red, bloody, or black. These symptoms could be signs of other problems, such as heart disease, gastric bleeding, or esophageal bleeding. MAKE SURE YOU:   Understand these instructions.  Will watch your condition.  Will get help right away if you are not doing well or get worse. Document Released: 05/19/2005 Document Revised: 11/01/2011 Document Reviewed: 02/26/2011 [facility] Patient Information 2013 [place], [place]. Cellulitis Cellulitis is an infection of the skin and the tissue beneath it. The infected area is usually red and tender. Cellulitis occurs most often in the arms and lower legs.  CAUSES  Cellulitis is caused by bacteria that enter the skin through cracks  or cuts in the skin. The most common types of bacteria that cause cellulitis are Staphylococcus and Streptococcus. SYMPTOMS   Redness and warmth.  Swelling.  Tenderness or pain.  Fever. DIAGNOSIS    Your caregiver can usually determine what is wrong based on a physical exam. Blood tests may also be done. TREATMENT  Treatment usually involves taking an antibiotic medicine. HOME CARE INSTRUCTIONS   Take your antibiotics as directed. Finish them even if you start to feel better.  Keep the infected arm or leg elevated to reduce swelling.  Apply a warm cloth to the affected area up to 4 times per day to relieve pain.  Only take over-the-counter or prescription medicines for pain, discomfort, or fever as directed by your caregiver.  Keep all follow-up appointments as directed by your caregiver. SEEK MEDICAL CARE IF:   You notice red streaks coming from the infected area.  Your red area gets larger or turns dark in color.  Your bone or joint underneath the infected area becomes painful after the skin has healed.  Your infection returns in the same area or another area.  You notice a swollen bump in the infected area.  You develop new symptoms. SEEK IMMEDIATE MEDICAL CARE IF:   You have a fever.  You feel very sleepy.  You develop vomiting or diarrhea.  You have a general ill feeling (malaise) with muscle aches and pains. MAKE SURE YOU:   Understand these instructions.  Will watch your condition.  Will get help right away if you are not doing well or get worse. Document Released: 05/19/2005 Document Revised: 02/08/2012 Document Reviewed: 10/25/2011 [facility] Patient Information 2013 [place], [place].

## 2012-07-19 ENCOUNTER — Ambulatory Visit (INDEPENDENT_AMBULATORY_CARE_PROVIDER_SITE_OTHER): Payer: Medicare Other | Admitting: Cardiovascular Disease

## 2012-07-19 ENCOUNTER — Encounter: Payer: Self-pay | Admitting: Cardiovascular Disease

## 2012-07-19 VITALS — BP 151/85 | HR 80 | Ht 62.5 in | Wt 137.0 lb

## 2012-07-19 DIAGNOSIS — R109 Unspecified abdominal pain: Secondary | ICD-10-CM

## 2012-07-19 DIAGNOSIS — I739 Peripheral vascular disease, unspecified: Secondary | ICD-10-CM

## 2012-07-19 NOTE — Patient Instructions (Addendum)
Please go to the [facility] office for an abdominal xray  Your physician recommends that you schedule a follow-up appointment in: 1 month.

## 2012-07-21 ENCOUNTER — Ambulatory Visit (INDEPENDENT_AMBULATORY_CARE_PROVIDER_SITE_OTHER): Payer: Medicare Other | Admitting: Family Medicine

## 2012-07-21 ENCOUNTER — Encounter: Payer: Self-pay | Admitting: Family Medicine

## 2012-07-21 ENCOUNTER — Encounter: Payer: Self-pay | Admitting: Cardiovascular Disease

## 2012-07-21 ENCOUNTER — Ambulatory Visit: Payer: Medicare Other

## 2012-07-21 VITALS — BP 140/86 | HR 79 | Temp 98.5°F | Resp 16 | Ht 62.5 in | Wt 135.0 lb

## 2012-07-21 DIAGNOSIS — R1084 Generalized abdominal pain: Secondary | ICD-10-CM

## 2012-07-21 DIAGNOSIS — M545 Low back pain, unspecified: Secondary | ICD-10-CM

## 2012-07-21 DIAGNOSIS — M419 Scoliosis, unspecified: Secondary | ICD-10-CM

## 2012-07-21 DIAGNOSIS — K5900 Constipation, unspecified: Secondary | ICD-10-CM

## 2012-07-21 LAB — POCT CBC
Granulocyte percent: 61.9 %G (ref 37–80)
HCT, POC: 43.2 % (ref 37.7–47.9)
Hemoglobin: 13.5 g/dL (ref 12.2–16.2)
Lymph, poc: 2.3 (ref 0.6–3.4)
MCH, POC: 29.2 pg (ref 27–31.2)
MCHC: 31.3 g/dL — AB (ref 31.8–35.4)
MCV: 93.3 fL (ref 80–97)
MID (cbc): 0.4 (ref 0–0.9)
MPV: 9.2 fL (ref 0–99.8)
POC Granulocyte: 4.5 (ref 2–6.9)
POC LYMPH PERCENT: 32 %L (ref 10–50)
POC MID %: 6.1 %M (ref 0–12)
Platelet Count, POC: 317 10*3/uL (ref 142–424)
RBC: 4.63 M/uL (ref 4.04–5.48)
RDW, POC: 14.3 %
WBC: 7.3 10*3/uL (ref 4.6–10.2)

## 2012-07-21 NOTE — Progress Notes (Signed)
Subjective: Patient is here for a couple of things. She's been having generalized abdominal pain. She tried taking some sodium bicarbonate without relief. One dose of milk of magnesia seem to help a little bit. She does have a history of constipation. She's been having some gas. She does hurt slightly across her abdomen. She also complains of low back pain that she's had for about 3 months. She went to her vascular doctor who was going to look into the circulation problems in her legs, but said she needed to get these other things taken care of first. She has a history of coronary artery disease has had multivessel bypass. She usually feels pretty well, but yesterday didn't feel like doing anything he just laid around and ate only a few bites. She knows of no specific injury to her back.  Objective: Pleasant elderly lady in no acute distress she moves around. Chest is clear. Heart regular without murmurs. Abdomen has normal bowel sounds, soft without organomegaly mass or street tenderness. She has vague nonspecific there is tenderness. No masses were appreciated. Her lumbar spine appears normal flexion and lateral flexion cause pain. Straight leg raise test negative. Sensory grossly intact intact in her feet.  Assessment: Generalized abdominal pain Low back pain History of peripheral vascular disease History of coronary artery disease  Plan: CBC LS spine Abdominal series  [facility] reading (PRIMARY) by  Dr. Alwyn Ren Scoliosis Stool/gas in colon  Will treat with miralax and see how she does.Marland Kitchen

## 2012-07-21 NOTE — Assessment & Plan Note (Signed)
The patient has worsening of right calf claudication with known occluded right distal SFA and moderately reduced ABI of 0.61. She did not tolerate treatment with Pletal. I recommend proceeding with abdominal aortogram and lower extremity runoff. I discussed this with the patient but she declined at this time. She wants to address her abdominal pain and constipation before she proceeds. In the meantime, I recommend continuing medical therapy. I will send her for an abdominal x-ray for evaluation. I will have her followup with me in one month to reevaluate the situation.

## 2012-07-21 NOTE — Patient Instructions (Addendum)
Drink lots of water  Take MiraLax once or twice daily as necessary to get bowels moving, then decrease to once daily until bowels are a little on the loose side, then decrease to one half dose. After your bowels have been going at least once or twice a day for 10 days then you can stop the medicine and start taking it on an as-needed basis.  Return at any time if worse.

## 2012-07-21 NOTE — Progress Notes (Signed)
HPI  This is a 66 year old female who is here today for a followup visit regarding PAD.  The patient has known history of coronary artery disease status post coronary artery bypass graft surgery last year. She has been stable from a cardiac standpoint. She does describe bilateral calf discomfort worse on the right side . She is known to have an occluded right SFA. She has been treated medically given that her claudication was not severe. She was started on Pletal but did not tolerate the medication due to reported lower extremity swelling. She had recent worsening of right calf claudication and was sent for an earlier followup appointment by Dr. Jens Som. The patient is currently complaining of significant abdominal pain which according to her has been a chronic intermittent problem. She suffers from chronic constipation.  No Known Allergies   Current Outpatient Prescriptions on File Prior to Visit  Medication Sig Dispense Refill  . amLODipine (NORVASC) 10 MG tablet Take 1 tablet (10 mg total) by mouth daily.  90 tablet  3  . aspirin 325 MG tablet Take 325 mg by mouth daily.        Marland Kitchen atorvastatin (LIPITOR) 40 MG tablet Take 1 tablet by mouth daily.      . Calcium Carb-Cholecalciferol (CALCIUM 1000 + D PO) Take by mouth daily.        . cephALEXin (KEFLEX) 500 MG capsule Take 1 capsule (500 mg total) by mouth 3 (three) times daily.  21 capsule  0  . hydrochlorothiazide (HYDRODIURIL) 25 MG tablet Take 25 mg by mouth as needed.      Marland Kitchen levothyroxine (SYNTHROID, LEVOTHROID) 88 MCG tablet Take 88 mcg by mouth daily.      . metoprolol tartrate (LOPRESSOR) 25 MG tablet Take 25 mg by mouth as needed.      . Multiple Vitamin (MULTIVITAMIN) capsule Take 1 capsule by mouth daily.        . pantoprazole (PROTONIX) 40 MG tablet Take 1 tablet (40 mg total) by mouth daily.  30 tablet  3  . potassium chloride SA (K-DUR,KLOR-CON) 20 MEQ tablet Take 1 tablet as needed      . ramipril (ALTACE) 10 MG capsule  Take 10 mg by mouth as needed.      . Vitamin D, Ergocalciferol, (DRISDOL) 50000 UNITS CAPS Take 50,000 Units by mouth every 7 (seven) days.         Past Medical History  Diagnosis Date  . Coronary artery disease   . Hyperlipidemia   . History of rheumatic fever   . Anemia   . OSTEOPENIA   . Insomnia   . COLONIC POLYPS, HX OF   . Hypothyroidism   . Hypertension   . Gastroesophageal reflux disease   . COPD (chronic obstructive pulmonary disease)   . Osteoarthritis   . DISC DISEASE, CERVICAL   . Anxiety   . PVD (peripheral vascular disease) 10/31/2011  . Vitamin D deficiency   . RSD (reflex sympathetic dystrophy) 10/31/2011  . PAD (peripheral artery disease) 12/03/2011     Past Surgical History  Procedure Date  . Coronary artery bypass and graft     LIMA to the LAD, saphenous vein graft to the acute marginal, saphenous vein graft to the diagonal and obtuse marginal.  . Abdominal hysterectomy   . Thyroidectomy   . Tonsillectomy   . Carpal tunnel release      Family History  Problem Relation Age of Onset  . Arthritis Other   . Heart disease  Other   . Hypertension Other   . Diabetes Other      History   Social History  . Marital Status: Single    Spouse Name: N/A    Number of Children: 2  . Years of Education: N/A   Occupational History  . [employer]    Social History Main Topics  . Smoking status: Former Smoker -- 0.8 packs/day for 45 years    Types: Cigarettes    Quit date: 11/05/2010  . Smokeless tobacco: Never Used  . Alcohol Use: No  . Drug Use: No  . Sexually Active: Not Currently   Other Topics Concern  . Not on file   Social History Narrative  . No narrative on file      PHYSICAL EXAM   BP 151/85  Pulse 80  Ht 5' 2.5" (1.588 m)  Wt 137 lb (62.143 kg)  BMI 24.66 kg/m2  SpO2 97%  Constitutional: She is oriented to person, place, and time. She appears well-developed and well-nourished. No distress.  HENT: No nasal discharge.  Head:  Normocephalic and atraumatic.  Eyes: Pupils are equal and round. Right eye exhibits no discharge. Left eye exhibits no discharge.  Neck: Normal range of motion. Neck supple. No JVD present. No thyromegaly present.  Cardiovascular: Normal rate, regular rhythm, normal heart sounds. Exam reveals no gallop and no friction rub. No murmur heard.  Pulmonary/Chest: Effort normal and breath sounds normal. No stridor. No respiratory distress. She has no wheezes. She has no rales. She exhibits no tenderness.  Abdominal: Soft. Bowel sounds are normal. She exhibits no distension. There is no tenderness. There is no rebound and no guarding.  Musculoskeletal: Normal range of motion. She exhibits no edema and no tenderness.  Neurological: She is alert and oriented to person, place, and time. Coordination normal.  Skin: Skin is warm and dry. No rash noted. She is not diaphoretic. No erythema. No pallor.  Psychiatric: She has a normal mood and affect. Her behavior is normal. Judgment and thought content normal.      ASSESSMENT AND PLAN

## 2012-09-06 ENCOUNTER — Ambulatory Visit: Payer: Medicare Other | Admitting: Cardiovascular Disease

## 2012-11-10 ENCOUNTER — Other Ambulatory Visit (INDEPENDENT_AMBULATORY_CARE_PROVIDER_SITE_OTHER): Payer: Medicare Other

## 2012-11-10 ENCOUNTER — Telehealth: Payer: Self-pay

## 2012-11-10 DIAGNOSIS — I2510 Atherosclerotic heart disease of native coronary artery without angina pectoris: Secondary | ICD-10-CM

## 2012-11-10 DIAGNOSIS — Z0000 Encounter for general adult medical examination without abnormal findings: Secondary | ICD-10-CM

## 2012-11-10 DIAGNOSIS — E039 Hypothyroidism, unspecified: Secondary | ICD-10-CM

## 2012-11-10 DIAGNOSIS — E785 Hyperlipidemia, unspecified: Secondary | ICD-10-CM

## 2012-11-10 DIAGNOSIS — I10 Essential (primary) hypertension: Secondary | ICD-10-CM

## 2012-11-10 LAB — CBC WITH DIFFERENTIAL/PLATELET
Basophils Absolute: 0 10*3/uL (ref 0.0–0.1)
Basophils Relative: 0.5 % (ref 0.0–3.0)
Eosinophils Absolute: 0.3 10*3/uL (ref 0.0–0.7)
Eosinophils Relative: 5 % (ref 0.0–5.0)
HCT: 45.3 % (ref 36.0–46.0)
Hemoglobin: 15.2 g/dL — ABNORMAL HIGH (ref 12.0–15.0)
Lymphocytes Relative: 34 % (ref 12.0–46.0)
Lymphs Abs: 1.7 10*3/uL (ref 0.7–4.0)
MCHC: 33.5 g/dL (ref 30.0–36.0)
MCV: 87.4 fl (ref 78.0–100.0)
Monocytes Absolute: 0.4 10*3/uL (ref 0.1–1.0)
Monocytes Relative: 7.2 % (ref 3.0–12.0)
Neutro Abs: 2.7 10*3/uL (ref 1.4–7.7)
Neutrophils Relative %: 53.3 % (ref 43.0–77.0)
Platelets: 262 10*3/uL (ref 150.0–400.0)
RBC: 5.19 Mil/uL — ABNORMAL HIGH (ref 3.87–5.11)
RDW: 15.1 % — ABNORMAL HIGH (ref 11.5–14.6)
WBC: 5.1 10*3/uL (ref 4.5–10.5)

## 2012-11-10 LAB — LIPID PANEL
Cholesterol: 207 mg/dL — ABNORMAL HIGH (ref 0–200)
HDL: 62.9 mg/dL (ref >39.00)
Total CHOL/HDL Ratio: 3
Triglycerides: 112 mg/dL (ref 0.0–149.0)
VLDL: 22.4 mg/dL (ref 0.0–40.0)

## 2012-11-10 LAB — LDL CHOLESTEROL, DIRECT: Direct LDL: 126.5 mg/dL

## 2012-11-10 LAB — BASIC METABOLIC PANEL
BUN: 13 mg/dL (ref 6–23)
CO2: 29 mEq/L (ref 19–32)
Calcium: 9.2 mg/dL (ref 8.4–10.5)
Chloride: 104 mEq/L (ref 96–112)
Creatinine, Ser: 0.8 mg/dL (ref 0.4–1.2)
GFR: 98.7 mL/min (ref >60.00)
Glucose, Bld: 109 mg/dL — ABNORMAL HIGH (ref 70–99)
Potassium: 3.5 mEq/L (ref 3.5–5.1)
Sodium: 141 mEq/L (ref 135–145)

## 2012-11-10 LAB — URINALYSIS, ROUTINE W REFLEX MICROSCOPIC
Bilirubin Urine: NEGATIVE
Ketones, ur: NEGATIVE
Leukocytes, UA: NEGATIVE
Nitrite: NEGATIVE
Specific Gravity, Urine: 1.01 (ref 1.000–1.030)
Urine Glucose: NEGATIVE
Urobilinogen, UA: 0.2 (ref 0.0–1.0)
pH: 7.5 (ref 5.0–8.0)

## 2012-11-10 LAB — TSH: TSH: 10.33 u[IU]/mL — ABNORMAL HIGH (ref 0.35–5.50)

## 2012-11-10 LAB — HEPATIC FUNCTION PANEL
ALT: 17 U/L (ref 0–35)
AST: 22 U/L (ref 0–37)
Albumin: 3.8 g/dL (ref 3.5–5.2)
Alkaline Phosphatase: 91 U/L (ref 39–117)
Bilirubin, Direct: 0 mg/dL (ref 0.0–0.3)
Total Bilirubin: 0.6 mg/dL (ref 0.3–1.2)
Total Protein: 7.2 g/dL (ref 6.0–8.3)

## 2012-11-10 NOTE — Telephone Encounter (Signed)
CPX labs entered  

## 2012-11-16 ENCOUNTER — Encounter: Payer: Self-pay | Admitting: Internal Medicine

## 2012-11-16 ENCOUNTER — Ambulatory Visit (INDEPENDENT_AMBULATORY_CARE_PROVIDER_SITE_OTHER): Payer: Medicare Other | Admitting: Internal Medicine

## 2012-11-16 VITALS — BP 132/88 | HR 76 | Temp 97.5°F | Ht 63.0 in | Wt 138.2 lb

## 2012-11-16 DIAGNOSIS — M25569 Pain in unspecified knee: Secondary | ICD-10-CM

## 2012-11-16 DIAGNOSIS — Z23 Encounter for immunization: Secondary | ICD-10-CM

## 2012-11-16 DIAGNOSIS — M25561 Pain in right knee: Secondary | ICD-10-CM

## 2012-11-16 DIAGNOSIS — K3189 Other diseases of stomach and duodenum: Secondary | ICD-10-CM

## 2012-11-16 DIAGNOSIS — R1013 Epigastric pain: Secondary | ICD-10-CM | POA: Insufficient documentation

## 2012-11-16 DIAGNOSIS — Z0000 Encounter for general adult medical examination without abnormal findings: Secondary | ICD-10-CM

## 2012-11-16 NOTE — Assessment & Plan Note (Signed)
With effusion, likely DJD but also possible lateral meniscus injury, for referal to ortho

## 2012-11-16 NOTE — Progress Notes (Signed)
Subjective:    Patient ID: [name], female    DOB: [birth year], 67 y.o.   MRN: [id number]  HPI  Here for wellness and f/u;  Overall doing ok;  Pt denies CP, worsening SOB, DOE, wheezing, orthopnea, PND, worsening LE edema, palpitations, dizziness or syncope.  Pt denies neurological change such as new headache, facial or extremity weakness.  Pt denies polydipsia, polyuria, or low sugar symptoms. Pt states overall good compliance with treatment and medications, good tolerability, and has been trying to follow lower cholesterol diet.  Pt denies worsening depressive symptoms, suicidal ideation or panic. No fever, night sweats, wt loss, loss of appetite, or other constitutional symptoms.  Pt states good ability with ADL's, has low fall risk, home safety reviewed and adequate, no other significant changes in hearing or vision, and only occasionally active with exercise.  Has some nasal congestion in the past few wks.  Also some recurrent dyspeptic symptoms for 3 mo, no vomiting and Denies worsening reflux, abd pain, dysphagia, n/v, bowel change or blood. Felt protonix did not work well so stopped, also another med from a UC MD she cant remember but now working well either for reflux.  Feels like she wants to vomit but trying not to.  Has not seen GI or noe egd.  No recent nsaid use, no ETOH.  Also with ongoing right knee pain, worse in the past few months, with recurrent swelling, localized primarily to anterolat aspect, doesn't remember any torque injury, but walking and turning makes worse, hears/feels a pop/click, cause her off balance to walk as she favors it, but no falls.  Has known lumbar deg changes but no radicular pain. Sees endo/dr south for thyroid. Past Medical History  Diagnosis Date  . Coronary artery disease   . Hyperlipidemia   . History of rheumatic fever   . Anemia   . OSTEOPENIA   . Insomnia   . COLONIC POLYPS, HX OF   . Hypothyroidism   . Hypertension   . Gastroesophageal  reflux disease   . COPD (chronic obstructive pulmonary disease)   . Osteoarthritis   . DISC DISEASE, CERVICAL   . Anxiety   . PVD (peripheral vascular disease) 10/31/2011  . Vitamin D deficiency   . RSD (reflex sympathetic dystrophy) 10/31/2011  . PAD (peripheral artery disease) 12/03/2011   Past Surgical History  Procedure Laterality Date  . Coronary artery bypass and graft      LIMA to the LAD, saphenous vein graft to the acute marginal, saphenous vein graft to the diagonal and obtuse marginal.  . Abdominal hysterectomy    . Thyroidectomy    . Tonsillectomy    . Carpal tunnel release      reports that she quit smoking about 2 years ago. Her smoking use included Cigarettes. She has a 36 pack-year smoking history. She has never used smokeless tobacco. She reports that she does not drink alcohol or use illicit drugs. family history includes Arthritis in her other; Diabetes in her other; Heart disease in her other; and Hypertension in her other. No Known Allergies Review of Systems Constitutional: Negative for diaphoresis, activity change, appetite change or unexpected weight change.  HENT: Negative for hearing loss, ear pain, facial swelling, mouth sores and neck stiffness.   Eyes: Negative for pain, redness and visual disturbance.  Respiratory: Negative for shortness of breath and wheezing.   Cardiovascular: Negative for chest pain and palpitations.  Gastrointestinal: Negative for diarrhea, blood in stool, abdominal distention or other pain Genitourinary:  Negative for hematuria, flank pain or change in urine volume.  Musculoskeletal: Negative for myalgias and joint swelling.  Skin: Negative for color change and wound.  Neurological: Negative for syncope and numbness. other than noted Hematological: Negative for adenopathy.  Psychiatric/Behavioral: Negative for hallucinations, self-injury, decreased concentration and agitation.      Objective:   Physical Exam BP 132/88  Pulse 76   Temp(Src) 97.5 F (36.4 C) (Oral)  Ht 5\' 3"  (1.6 m)  Wt 138 lb 4 oz (62.71 kg)  BMI 24.5 kg/m2  SpO2 98% VS noted,  Constitutional: Pt is oriented to person, place, and time. Appears well-developed and well-nourished.  Head: Normocephalic and atraumatic.  Right Ear: External ear normal.  Left Ear: External ear normal.  Nose: Nose normal.  Mouth/Throat: Oropharynx is clear and moist.  Eyes: Conjunctivae and EOM are normal. Pupils are equal, round, and reactive to light.  Neck: Normal range of motion. Neck supple. No JVD present. No tracheal deviation present.  Cardiovascular: Normal rate, regular rhythm, normal heart sounds and intact distal pulses.   Pulmonary/Chest: Effort normal and breath sounds normal.  Abdominal: Soft. Bowel sounds are normal. There is no tenderness. No HSM  Musculoskeletal: Normal range of motion. Exhibits no edema.  Lymphadenopathy:  Has no cervical adenopathy.  Neurological: Pt is alert and oriented to person, place, and time. Pt has normal reflexes. No cranial nerve deficit.  Skin: Skin is warm and dry. No rash noted.  Psychiatric:  Has  normal mood and affect. Behavior is normal.  Right knee with mild lateral crepitus, 1+ effusion, decr ROM, mild tender over lateral joint line    Assessment & Plan:

## 2012-11-16 NOTE — Assessment & Plan Note (Signed)

## 2012-11-16 NOTE — Addendum Note (Signed)
Addended by: Scharlene Gloss B on: 11/16/2012 11:03 AM   Modules accepted: Orders

## 2012-11-16 NOTE — Assessment & Plan Note (Signed)
To cont current PPI for now, but also refer GI

## 2012-11-16 NOTE — Patient Instructions (Addendum)
You had the pneumonia shot today Please check with Dr Evlyn Kanner about the thyroid test Please continue all other medications as before, and refills have been done if requested. Please have the pharmacy call with any other refills you may need. Please continue your efforts at being more active, low cholesterol diet, and weight control. You are otherwise up to date with prevention measures today. You are given the blood test results today You will be contacted regarding the referral for: GI, and orthopedic Thank you for enrolling in MyChart. Please follow the instructions below to securely access your online medical record. MyChart allows you to send messages to your doctor, view your test results, renew your prescriptions, schedule appointments, and more. To Log into My Chart online, please go by [employer] or [employer] to [email or web address], or download the MyChart App from the [employer] of [employer] .  Your Username is: [email or web address] (password [email or web address]) Please send a Engineer, water on Mychart later today. Please return in 6 months, or sooner if needed

## 2012-11-17 ENCOUNTER — Encounter: Payer: Self-pay | Admitting: Gastroenterology

## 2012-12-01 ENCOUNTER — Telehealth: Payer: Self-pay | Admitting: Gastroenterology

## 2012-12-01 NOTE — Telephone Encounter (Signed)
Pt scheduled to see Mike Gip PA Monday 12/04/12@10am . Pt aware of appt date and time. Pt requested a sooner appt.

## 2012-12-04 ENCOUNTER — Encounter: Payer: Self-pay | Admitting: Physician Assistant

## 2012-12-04 ENCOUNTER — Ambulatory Visit (INDEPENDENT_AMBULATORY_CARE_PROVIDER_SITE_OTHER)
Admission: RE | Admit: 2012-12-04 | Discharge: 2012-12-04 | Disposition: A | Discharge status: Home / Self Care | Payer: Medicare Other | Source: Ambulatory Visit | Attending: Physician Assistant | Admitting: Physician Assistant

## 2012-12-04 ENCOUNTER — Other Ambulatory Visit (INDEPENDENT_AMBULATORY_CARE_PROVIDER_SITE_OTHER): Payer: Medicare Other

## 2012-12-04 ENCOUNTER — Ambulatory Visit (INDEPENDENT_AMBULATORY_CARE_PROVIDER_SITE_OTHER): Payer: Medicare Other | Admitting: Physician Assistant

## 2012-12-04 DIAGNOSIS — R1031 Right lower quadrant pain: Secondary | ICD-10-CM

## 2012-12-04 DIAGNOSIS — R198 Other specified symptoms and signs involving the digestive system and abdomen: Secondary | ICD-10-CM

## 2012-12-04 DIAGNOSIS — R1032 Left lower quadrant pain: Secondary | ICD-10-CM

## 2012-12-04 DIAGNOSIS — R194 Change in bowel habit: Secondary | ICD-10-CM

## 2012-12-04 DIAGNOSIS — G8929 Other chronic pain: Secondary | ICD-10-CM

## 2012-12-04 LAB — COMPREHENSIVE METABOLIC PANEL
ALT: 21 U/L (ref 0–35)
AST: 21 U/L (ref 0–37)
Albumin: 3.6 g/dL (ref 3.5–5.2)
Alkaline Phosphatase: 82 U/L (ref 39–117)
BUN: 13 mg/dL (ref 6–23)
CO2: 30 mEq/L (ref 19–32)
Calcium: 9.1 mg/dL (ref 8.4–10.5)
Chloride: 104 mEq/L (ref 96–112)
Creatinine, Ser: 0.7 mg/dL (ref 0.4–1.2)
GFR: 103.44 mL/min (ref >60.00)
Glucose, Bld: 95 mg/dL (ref 70–99)
Potassium: 3.7 mEq/L (ref 3.5–5.1)
Sodium: 140 mEq/L (ref 135–145)
Total Bilirubin: 0.5 mg/dL (ref 0.3–1.2)
Total Protein: 6.9 g/dL (ref 6.0–8.3)

## 2012-12-04 LAB — CBC WITH DIFFERENTIAL/PLATELET
Basophils Absolute: 0 10*3/uL (ref 0.0–0.1)
Basophils Relative: 0.5 % (ref 0.0–3.0)
Eosinophils Absolute: 0.1 10*3/uL (ref 0.0–0.7)
Eosinophils Relative: 2.1 % (ref 0.0–5.0)
HCT: 43.6 % (ref 36.0–46.0)
Hemoglobin: 14.6 g/dL (ref 12.0–15.0)
Lymphocytes Relative: 29.7 % (ref 12.0–46.0)
Lymphs Abs: 1.7 10*3/uL (ref 0.7–4.0)
MCHC: 33.5 g/dL (ref 30.0–36.0)
MCV: 87.9 fl (ref 78.0–100.0)
Monocytes Absolute: 0.3 10*3/uL (ref 0.1–1.0)
Monocytes Relative: 5.8 % (ref 3.0–12.0)
Neutro Abs: 3.6 10*3/uL (ref 1.4–7.7)
Neutrophils Relative %: 61.9 % (ref 43.0–77.0)
Platelets: 239 10*3/uL (ref 150.0–400.0)
RBC: 4.96 Mil/uL (ref 3.87–5.11)
RDW: 15.6 % — ABNORMAL HIGH (ref 11.5–14.6)
WBC: 5.8 10*3/uL (ref 4.5–10.5)

## 2012-12-04 MED ORDER — IOHEXOL 300 MG/ML  SOLN
100.0000 mL | Freq: Once | INTRAMUSCULAR | Status: AC | PRN
  Administered 2012-12-04: 100 mL via INTRAVENOUS

## 2012-12-04 MED ORDER — METRONIDAZOLE 500 MG PO TABS: 500.0000 mg | ORAL_TABLET | Freq: Two times a day (BID) | ORAL | Status: DC

## 2012-12-04 MED ORDER — CIPROFLOXACIN HCL 500 MG PO TABS: 500.0000 mg | ORAL_TABLET | Freq: Two times a day (BID) | ORAL | Status: AC

## 2012-12-04 NOTE — Progress Notes (Signed)
Subjective:    Patient ID: [name], female    DOB: 1945/12/19, 67 y.o.   MRN: [id number]  HPI  [name] is a pleasant 67 year old female known to Dr. Christella Hartigan from colonoscopy done in 2009. She had pan diverticular disease but no polyps. She has history of coronary artery disease status post CABG in 2011, hypertension, COPD, and is status post partial hysterectomy remotely.  She comes in today with complaints of nausea which has been present intermittently over the past couple of months and somewhat progressive. She says at times she feels like she needs to vomit but hasn't. She's also been having lower abdominal pains and increased bloating and gas. She has not noted any change in her symptoms with by mouth intake. She says her appetite is fair but she has not lost any weight. She's had no associated fever or chills. She tends to stay constipated uses MiraLax and/or milk of magnesia. She has not noticed any change in her bowel habits since onset of lower abdominal discomfort and no melena or hematochezia. She describes her pain as aching and sharp.    Review of Systems  Constitutional: Negative.   HENT: Negative.   Eyes: Negative.   Respiratory: Negative.   Cardiovascular: Negative.   Gastrointestinal: Positive for nausea, abdominal pain and constipation.  Endocrine: Negative.   Genitourinary: Negative.   Musculoskeletal: Negative.   Allergic/Immunologic: Negative.   Neurological: Negative.   Hematological: Negative.   Psychiatric/Behavioral: Negative.    Outpatient Prescriptions Prior to Visit  Medication Sig Dispense Refill  . aspirin 325 MG tablet Take 325 mg by mouth daily.        Marland Kitchen atorvastatin (LIPITOR) 40 MG tablet Take 1 tablet by mouth daily.      . Calcium Carb-Cholecalciferol (CALCIUM 1000 + D PO) Take by mouth daily.        . hydrochlorothiazide (HYDRODIURIL) 25 MG tablet Take 25 mg by mouth as needed.      . metoprolol tartrate (LOPRESSOR) 25 MG tablet Take 25 mg by  mouth as needed.      . Multiple Vitamin (MULTIVITAMIN) capsule Take 1 capsule by mouth daily.        . pantoprazole (PROTONIX) 40 MG tablet Take 1 tablet (40 mg total) by mouth daily.  30 tablet  3  . potassium chloride SA (K-DUR,KLOR-CON) 20 MEQ tablet Take 1 tablet as needed      . ramipril (ALTACE) 10 MG capsule Take 10 mg by mouth as needed.      . Vitamin D, Ergocalciferol, (DRISDOL) 50000 UNITS CAPS Take 50,000 Units by mouth every 7 (seven) days.      Marland Kitchen amLODipine (NORVASC) 10 MG tablet Take 1 tablet (10 mg total) by mouth daily.  90 tablet  3  . levothyroxine (SYNTHROID, LEVOTHROID) 88 MCG tablet Take 88 mcg by mouth daily.       No facility-administered medications prior to visit.   No Known Allergies Patient Active Problem List  Diagnosis  . HYPOTHYROIDISM  . HYPERLIPIDEMIA  . TOBACCO ABUSE  . HYPERTENSION  . ALLERGIC RHINITIS  . GERD  . CONSTIPATION  . OSTEOARTHRITIS  . DISC DISEASE, CERVICAL  . Pain in Soft Tissues of Limb  . OSTEOPENIA  . MURMUR  . COLONIC POLYPS, HX OF  . CAD  . Preventative health care  . COPD (chronic obstructive pulmonary disease)  . Anemia  . History of rheumatic fever  . Insomnia  . Anxiety  . Preop exam for internal medicine  .  PVD (peripheral vascular disease)  . Vitamin d deficiency  . RSD (reflex sympathetic dystrophy)  . PAD (peripheral artery disease)  . Knee pain, right  . Dyspepsia   History  Substance Use Topics  . Smoking status: Former Smoker -- 0.80 packs/day for 45 years    Types: Cigarettes    Quit date: 11/05/2010  . Smokeless tobacco: Never Used  . Alcohol Use: No   family history includes Arthritis in her maternal aunt; Diabetes in her maternal aunt; Heart disease in her maternal aunt; and Hypertension in her maternal aunt.  There is no history of Colon cancer, and Kidney disease, and Liver disease, and Throat cancer, and Stomach cancer, .     Objective:   Physical Exam well-developed older African American  female in no acute distress, pleasant blood pressure 130/72 pulse 68 height 5 foot 3 weight 136. HEENT; nontraumatic normocephalic EOMI PERRLA sclera anicteric,Neck; Supple no JVD, Cardiovascular; regular rate and rhythm with S1-S2 no murmur or gallop she has a sternal incisional scar, Pulmonary; clear bilaterally, Abdomen; soft nondistended bowel sounds are active she is tender in the left upper quadrant left mid and lower quadrant there is no guarding or rebound no palpable mass or hepatosplenomegaly bowel sounds are active, Rectal; exam heme-negative, Extremities; no clubbing cyanosis or edema skin warm and dry, Psych; mood and affect normal and appropriate.        Assessment & Plan:  #73 67 year old female with history of pan diverticular disease now presenting with 2 month history of intermittent nausea associated with lower abdominal pain bloating and gas. Will need to rule out a small during diverticulitis versus other intra-abdominal inflammatory process. #2 coronary artery disease status post CABG #3 COPD #4 status post partial hysterectomy #5 Peripheral arterial disease  Plan we'll check CBC with differential and see met today Schedule for CT scan of the abdomen and pelvis with contrast-this will be done later today and we'll decide on need for antibiotics after CT has been reviewed;

## 2012-12-04 NOTE — Patient Instructions (Addendum)
Please go to the basement level to have your labs drawn.  We sent prescriptions for Cipro and Flagyl to [facility]. Do not pick it up until we see what the results of the CT are.   You have been scheduled for a CT scan of the abdomen and pelvis at [facility] ([address]---this is in the same building as Architectural technologist).   You are scheduled today, 12-04-2012 at 3:00 PM . You should arrive at 2: 45 PM  prior to your appointment time for registration. Please follow the written instructions below on the day of your exam:  WARNING: IF YOU ARE ALLERGIC TO IODINE/X-RAY DYE, PLEASE NOTIFY RADIOLOGY IMMEDIATELY AT [phone]! YOU WILL BE GIVEN A 13 HOUR PREMEDICATION PREP.  1) Do not eat or drink anything after 11:00 am  (4 hours prior to your test) 2) You have been given 2 bottles of oral contrast to drink. The solution may taste better if refrigerated, but do NOT add ice or any other liquid to this solution. Shake  well before drinking.    Drink 1 bottle of contrast @ 1:00 PM (2 hours prior to your exam)  Drink 1 bottle of contrast @ 2:00 PM  (1 hour prior to your exam)  You may take any medications as prescribed with a small amount of water except for the following: Metformin, Glucophage, Glucovance, Avandamet, Riomet, Fortamet, Actoplus Met, Janumet, Glumetza or Metaglip. The above medications must be held the day of the exam AND 48 hours after the exam.  The purpose of you drinking the oral contrast is to aid in the visualization of your intestinal tract. The contrast solution may cause some diarrhea. Before your exam is started, you will be given a small amount of fluid to drink. Depending on your individual set of symptoms, you may also receive an intravenous injection of x-ray contrast/dye. Plan on being at [facility] for 30 minutes or long, depending on the type of exam you are having performed.  If you have any questions regarding your  exam or if you need to reschedule, you may call the CT department at [phone] between the hours of 8:00 am and 5:00 pm, Monday-Friday.  ________________________________________________________________________

## 2012-12-05 ENCOUNTER — Other Ambulatory Visit: Payer: Self-pay | Admitting: *Deleted

## 2012-12-05 ENCOUNTER — Telehealth: Payer: Self-pay | Admitting: *Deleted

## 2012-12-05 DIAGNOSIS — K219 Gastro-esophageal reflux disease without esophagitis: Secondary | ICD-10-CM

## 2012-12-05 MED ORDER — PANTOPRAZOLE SODIUM 40 MG PO TBEC: DELAYED_RELEASE_TABLET | ORAL | Status: DC

## 2012-12-05 MED ORDER — ALIGN PO CAPS: 1.0000 | ORAL_CAPSULE | Freq: Every day | ORAL | Status: DC

## 2012-12-05 NOTE — Telephone Encounter (Signed)
Rx was sent for Cipro and Flagyl yesterday. Called [facility] at [address] and cancelled the Flagyl rx.

## 2012-12-05 NOTE — Telephone Encounter (Signed)
Yes I  am Ok with her taking a course of cipro 500mg   Twice daily x 10 days- she may have a very mild diverticulitis. I did not send any abx so please send rx to her pharmacy- thanks

## 2012-12-05 NOTE — Telephone Encounter (Signed)
Patient is asking if she needs to get the antibiotics at the drug store. She is still having pain.

## 2012-12-11 NOTE — Progress Notes (Signed)
i agree with this plan 

## 2012-12-18 ENCOUNTER — Ambulatory Visit: Payer: Medicare Other | Admitting: Gastroenterology

## 2012-12-29 ENCOUNTER — Ambulatory Visit: Payer: Medicare Other | Admitting: Gastroenterology

## 2013-01-10 ENCOUNTER — Encounter: Payer: Self-pay | Admitting: Gastroenterology

## 2013-01-10 ENCOUNTER — Ambulatory Visit (INDEPENDENT_AMBULATORY_CARE_PROVIDER_SITE_OTHER): Payer: Medicare Other | Admitting: Gastroenterology

## 2013-01-10 VITALS — BP 142/90 | HR 88 | Ht 63.0 in | Wt 137.0 lb

## 2013-01-10 DIAGNOSIS — K219 Gastro-esophageal reflux disease without esophagitis: Secondary | ICD-10-CM

## 2013-01-10 DIAGNOSIS — K5900 Constipation, unspecified: Secondary | ICD-10-CM

## 2013-01-10 NOTE — Patient Instructions (Addendum)
Retry miralax one dose every day for your constipation.  If it works, stay on it. You should change the way you are taking your antiacid medicine (pantoprazole) so that you are taking it 20-30 minutes prior to a decent meal as that is the way the pill is designed to work most effectively. You will be set up for an upper endoscopy for gerd, dysphgaia (LEC moderate sedation).                                               We are excited to introduce MyChart, a new best-in-class service that provides you online access to important information in your electronic medical record. We want to make it easier for you to view your health information - all in one secure location - when and where you need it. We expect MyChart will enhance the quality of care and service we provide.  When you register for MyChart, you can:    View your test results.    Request appointments and receive appointment reminders via email.    Request medication renewals.    View your medical history, allergies, medications and immunizations.    Communicate with your physician's office through a password-protected site.    Conveniently print information such as your medication lists.  To find out if MyChart is right for you, please talk to a member of our clinical staff today. We will gladly answer your questions about this free health and wellness tool.  If you are age 52 or older and want a member of your family to have access to your record, you must provide written consent by completing a proxy form available at our office. Please speak to our clinical staff about guidelines regarding accounts for patients younger than age 66.  As you activate your MyChart account and need any technical assistance, please call the MyChart technical support line at [phone]) or email your question to [email or web address]. If you email your question(s), please include your name, a return phone number and the best time  to reach you.  If you have non-urgent health-related questions, you can send a message to our office through MyChart at [email or web address]. If you have a medical emergency, call 911.  Thank you for using MyChart as your new health and wellness resource!   MyChart licensed from [employer],  [id number]. Patents Pending.

## 2013-01-10 NOTE — Progress Notes (Signed)
Review of pertinent gastrointestinal problems: 1. routine risk for colon cancer, colonoscopy February 2009 , Dr. Christella Hartigan, left-sided diverticulosis without any polyps. Recall colonoscopy at 10 year interval  This is a  very pleasant 67 year old woman whom I last saw 4 or 5 years ago.   Her overall abd discomforts are better.    She tends to be constipated, for many years.  No changes, she takes periodic sodium bicarb.  She tried metamucil without help only somewhat.  MOM has helped.  She has tried miralax, did not help too much.  Overall her weight is up 30 pounds in past 2-3 years.  She has intermittent dysphagia.  Has chronic GERD.  Was on nexium without help. Currently on bid protonix.   Still bothered by pyrosis.  protonix after breakfast, protonix after dinner.  she was seen here in the office one month ago by Amy. Was felt that she possibly had diverticulitis. She was put on ciprofloxacin and Flagyl in. Please. CT scan and lab tests were obtained, see those results summarized below.    IMPRESSION from CT scan last month:  No acute findings in the abdomen or pelvis.  Right colonic diverticulosis without diverticulitis, as before.  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  Stable appearance of circumferential wall thickening in the distal  esophagus. Esophagitis would be a consideration. Given the nearly  2 years of interval stability, esophageal neoplasm is unlikely.  Cbc, cmet 11/2012 were normal   Past Medical History  Diagnosis Date  . Coronary artery disease   . Hyperlipidemia   . History of rheumatic fever   . Anemia   . OSTEOPENIA   . Insomnia   . COLONIC POLYPS, HX OF   . Hypothyroidism   . Hypertension   . Gastroesophageal reflux disease   . COPD (chronic obstructive pulmonary disease)   . Osteoarthritis   . DISC DISEASE, CERVICAL   . Anxiety   . PVD (peripheral vascular disease) 10/31/2011  . Vitamin D deficiency   . RSD  (reflex sympathetic dystrophy) 10/31/2011  . PAD (peripheral artery disease) 12/03/2011    Past Surgical History  Procedure Laterality Date  . Coronary artery bypass graft  10/2009    LIMA to the LAD, saphenous vein graft to the acute marginal, saphenous vein graft to the diagonal and obtuse marginal.  . Abdominal hysterectomy  1977  . Thyroidectomy    . Tonsillectomy    . Carpal tunnel release    . Cataract extraction Right     Current Outpatient Prescriptions  Medication Sig Dispense Refill  . aspirin 325 MG tablet Take 325 mg by mouth daily.        Marland Kitchen atorvastatin (LIPITOR) 40 MG tablet Take 1 tablet by mouth daily.      . bifidobacterium infantis (ALIGN) capsule Take 1 capsule by mouth daily.  28 capsule  0  . Calcium Carb-Cholecalciferol (CALCIUM 1000 + D PO) Take by mouth daily.        . hydrochlorothiazide (HYDRODIURIL) 25 MG tablet Take 25 mg by mouth as needed.      Marland Kitchen levothyroxine (SYNTHROID, LEVOTHROID) 112 MCG tablet Take 112 mcg by mouth daily before breakfast.      . metoprolol tartrate (LOPRESSOR) 25 MG tablet Take 25 mg by mouth as needed.      . Multiple Vitamin (MULTIVITAMIN) capsule Take 1 capsule by mouth daily.        . pantoprazole (PROTONIX) 40 MG tablet Take one po  BID x 3 weeks then one po daily  60 tablet  3  . potassium chloride SA (K-DUR,KLOR-CON) 20 MEQ tablet Take 1 tablet as needed      . PRENATAL VITAMINS PO Take 1 capsule by mouth daily.      . Vitamin D, Ergocalciferol, (DRISDOL) 50000 UNITS CAPS Take 50,000 Units by mouth every 7 (seven) days.      Marland Kitchen amLODipine (NORVASC) 10 MG tablet Take 1 tablet (10 mg total) by mouth daily.  90 tablet  3  . ramipril (ALTACE) 10 MG capsule Take 10 mg by mouth as needed.       No current facility-administered medications for this visit.    Allergies as of 01/10/2013  . (No Known Allergies)    Family History  Problem Relation Age of Onset  . Arthritis Maternal Aunt   . Heart disease Maternal Aunt   .  Hypertension Maternal Aunt   . Diabetes Maternal Aunt   . Colon cancer Neg Hx   . Kidney disease Neg Hx   . Liver disease Neg Hx   . Throat cancer Neg Hx   . Stomach cancer Neg Hx     History   Social History  . Marital Status: Single    Spouse Name: N/A    Number of Children: 2  . Years of Education: N/A   Occupational History  . [employer]    Social History Main Topics  . Smoking status: Former Smoker -- 0.80 packs/day for 45 years    Types: Cigarettes    Quit date: 11/05/2010  . Smokeless tobacco: Never Used  . Alcohol Use: No  . Drug Use: No  . Sexually Active: Not Currently   Other Topics Concern  . Not on file   Social History Narrative  . No narrative on file      Physical Exam: BP 142/90  Pulse 88  Ht 5\' 3"  (1.6 m)  Wt 137 lb (62.143 kg)  BMI 24.27 kg/m2  SpO2 98% Constitutional: generally well-appearing Psychiatric: alert and oriented x3 Abdomen: soft, nontender, nondistended, no obvious ascites, no peritoneal signs, normal bowel sounds     Assessment and plan: 67 y.o. female with improved lower abdominal discomfort, chronic GERD, intermittent dysphasia, chronic constipation.  She will retry MiraLax on a daily basis. She is not taking proton pump inhibitor at the current time in relation to meals and she will change that. I would like to proceed with EGD at her soonest convenience for her chronic GERD, dysphasia. CT scan suggested thickening of her distal esophagus which was stable compared to 2 years ago.

## 2013-01-30 ENCOUNTER — Ambulatory Visit (AMBULATORY_SURGERY_CENTER): Payer: Medicare Other | Admitting: Gastroenterology

## 2013-01-30 ENCOUNTER — Encounter: Payer: Self-pay | Admitting: Gastroenterology

## 2013-01-30 VITALS — BP 153/88 | HR 74 | Temp 96.5°F | Resp 42 | Ht 65.0 in | Wt 137.0 lb

## 2013-01-30 DIAGNOSIS — K449 Diaphragmatic hernia without obstruction or gangrene: Secondary | ICD-10-CM

## 2013-01-30 DIAGNOSIS — K2970 Gastritis, unspecified, without bleeding: Secondary | ICD-10-CM

## 2013-01-30 DIAGNOSIS — K219 Gastro-esophageal reflux disease without esophagitis: Secondary | ICD-10-CM

## 2013-01-30 DIAGNOSIS — K2990 Gastroduodenitis, unspecified, without bleeding: Secondary | ICD-10-CM

## 2013-01-30 MED ORDER — SODIUM CHLORIDE 0.9 % IV SOLN: 500.0000 mL | INTRAVENOUS | Status: DC

## 2013-01-30 NOTE — Patient Instructions (Addendum)

## 2013-01-30 NOTE — Progress Notes (Signed)
Patient did not have preoperative order for IV antibiotic SSI prophylaxis. (G8918)  Patient did not experience any of the following events: a burn prior to discharge; a fall within the facility; wrong site/side/patient/procedure/implant event; or a hospital transfer or hospital admission upon discharge from the facility. (G8907)  

## 2013-01-30 NOTE — Op Note (Signed)
[facility] [address] [place], [address]   ENDOSCOPY PROCEDURE REPORT  PATIENT: [name], [name]  MR#: [id number] BIRTHDATE: Apr 10, 1946 , 66  yrs. old GENDER: Female ENDOSCOPIST: Rachael Fee, MD PROCEDURE DATE:  01/30/2013 PROCEDURE:  EGD w/ biopsy ASA CLASS:     Class II INDICATIONS:  GERD, intermittent dysphagia, abnormal (but stable) CT scan over past 2 years. MEDICATIONS: Fentanyl 50 mcg IV, Versed 5 mg IV, and These medications were titrated to patient response per physician's verbal order TOPICAL ANESTHETIC: none  DESCRIPTION OF PROCEDURE: After the risks benefits and alternatives of the procedure were thoroughly explained, informed consent was obtained.  The LB [id number] endoscope was introduced through the mouth and advanced to the second portion of the duodenum. Without limitations.  The instrument was slowly withdrawn as the mucosa was fully examined.   There was a small hiatal hernia (2-3cm) without any Cameron's type erosions.  There was mild, non-specific distal gastritis that was biopsied.  The examination was otherwise normal.  Retroflexed views revealed no abnormalities.     The scope was then withdrawn from the patient and the procedure completed. COMPLICATIONS: There were no complications.  ENDOSCOPIC IMPRESSION: There was a small hiatal hernia (2-3cm) without any Cameron's type erosions.  There was mild, non-specific distal gastritis that was biopsied.  The examination was otherwise normal.  RECOMMENDATIONS: Await final pathology.  If biopsies are positive for H.  pylori then you will be started on appropriate antibiotics. You should stay on pantoprazole one pill twice daily, 20-30 min prior to breakfast and dinner meals.  eSigned:  Rachael Fee, MD 01/30/2013 9:19 AM

## 2013-01-31 ENCOUNTER — Telehealth: Payer: Self-pay

## 2013-01-31 NOTE — Telephone Encounter (Signed)
Left message on answering machine. 

## 2013-02-06 ENCOUNTER — Encounter: Payer: Self-pay | Admitting: Gastroenterology

## 2013-02-20 ENCOUNTER — Encounter: Payer: Self-pay | Admitting: Internal Medicine

## 2013-04-20 ENCOUNTER — Other Ambulatory Visit: Payer: Self-pay | Admitting: Internal Medicine

## 2013-05-02 ENCOUNTER — Telehealth: Payer: Self-pay | Admitting: *Deleted

## 2013-05-02 DIAGNOSIS — R5381 Other malaise: Secondary | ICD-10-CM

## 2013-05-02 NOTE — Telephone Encounter (Signed)
Order has been done.

## 2013-05-02 NOTE — Telephone Encounter (Signed)
Pt called requesting a Testosterone work up.  States she just does not feel herself and requests a work up.  Please advise

## 2013-05-03 NOTE — Telephone Encounter (Signed)
Spoke with pt advised of Mds order

## 2013-05-07 ENCOUNTER — Other Ambulatory Visit (INDEPENDENT_AMBULATORY_CARE_PROVIDER_SITE_OTHER): Payer: Medicare Other

## 2013-05-07 DIAGNOSIS — R5381 Other malaise: Secondary | ICD-10-CM

## 2013-05-07 LAB — TESTOSTERONE: Testosterone: 25 ng/dL (ref 10.00–70.00)

## 2013-05-11 ENCOUNTER — Encounter: Payer: Self-pay | Admitting: Internal Medicine

## 2013-05-11 ENCOUNTER — Ambulatory Visit (INDEPENDENT_AMBULATORY_CARE_PROVIDER_SITE_OTHER): Payer: Medicare Other | Admitting: Internal Medicine

## 2013-05-11 VITALS — BP 138/80 | HR 97 | Temp 97.0°F | Ht 62.0 in | Wt 137.2 lb

## 2013-05-11 DIAGNOSIS — M25569 Pain in unspecified knee: Secondary | ICD-10-CM

## 2013-05-11 DIAGNOSIS — I10 Essential (primary) hypertension: Secondary | ICD-10-CM

## 2013-05-11 DIAGNOSIS — Z0000 Encounter for general adult medical examination without abnormal findings: Secondary | ICD-10-CM

## 2013-05-11 DIAGNOSIS — J309 Allergic rhinitis, unspecified: Secondary | ICD-10-CM

## 2013-05-11 DIAGNOSIS — Z23 Encounter for immunization: Secondary | ICD-10-CM

## 2013-05-11 DIAGNOSIS — M25561 Pain in right knee: Secondary | ICD-10-CM

## 2013-05-11 MED ORDER — METOPROLOL TARTRATE 25 MG PO TABS: 25.0000 mg | ORAL_TABLET | Freq: Two times a day (BID) | ORAL | Status: DC | PRN

## 2013-05-11 MED ORDER — FLUTICASONE PROPIONATE 50 MCG/ACT NA SUSP: 2.0000 | Freq: Every day | NASAL | Status: DC

## 2013-05-11 MED ORDER — RAMIPRIL 10 MG PO CAPS: 10.0000 mg | ORAL_CAPSULE | Freq: Every day | ORAL | Status: DC

## 2013-05-11 MED ORDER — METHYLPREDNISOLONE ACETATE 80 MG/ML IJ SUSP
80.0000 mg | Freq: Once | INTRAMUSCULAR | Status: AC
  Administered 2013-05-11: 80 mg via INTRAMUSCULAR

## 2013-05-11 MED ORDER — FEXOFENADINE HCL 180 MG PO TABS: 180.0000 mg | ORAL_TABLET | Freq: Every day | ORAL | Status: DC

## 2013-05-11 NOTE — Patient Instructions (Addendum)
Please do not take the Toprol Xl as you were instructed by the nurse from [facility] who visited your home, as this is not a medication recommended by your physicians at this time  You should take the metoprolol 25 mg as needed (as on your medication list), and you can ask Dr Jens Som about this when you see him next  Please NEVER make any medication or treatment changes if instructed by an Biomedical engineer (nurse or MD) unless this is confirmed by your treating physicians  You had the steroid shot today  Please take all new medication as prescribed - the allegra and flonase at least for the fall season allergies  Please continue all other medications as before, and refills have been done if requested, including the metoprolol, and ramipril  Please have the pharmacy call with any other refills you may need.  No further lab work needed today  Please call if you change your mind about seeing Dr Katrinka Blazing in our office about your right knee  Please keep your appointments with your specialists as you have planned - cardiology  Please return in 6 months, or sooner if needed, with Lab testing done 3-5 days before

## 2013-05-11 NOTE — Assessment & Plan Note (Signed)
stable overall by history and exam, recent data reviewed with pt, and pt to continue medical treatment as before,  to f/u any worsening symptoms or concerns BP Readings from Last 3 Encounters:  05/11/13 138/80  01/30/13 153/88  01/10/13 142/90

## 2013-05-11 NOTE — Progress Notes (Signed)
Subjective:    Patient ID: [name], female    DOB: [birth year], 67 y.o.   MRN: [id number]  HPI  Here to f/u, had heard about low testotserone being a problem for women by a TV doctor, so level here normal sept 15.  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 Admits to not always taking the lipitor, and has run out of the ramipril in past wks.  Does have several wks ongoing nasal allergy symptoms with clearish congestion, itch and sneezing, without fever, pain, ST, cough, swelling or wheezing.   Pt denies fever, wt loss, night sweats, loss of appetite, or other constitutional symptoms For flu shot today.  Also with ongoing right knee pain, effusion s/p cortisone in past per ortho (murphy/wainer), and did help but does not think pain bad enough to do again, even though is concerned about potential giveaway and fall, declines f/u with Dr Katrinka Blazing in our office for this today as well.  No hx of chf Past Medical History  Diagnosis Date  . Coronary artery disease   . Hyperlipidemia   . History of rheumatic fever   . Anemia   . OSTEOPENIA   . Insomnia   . COLONIC POLYPS, HX OF   . Hypothyroidism   . Hypertension   . Gastroesophageal reflux disease   . Osteoarthritis   . DISC DISEASE, CERVICAL   . Anxiety   . PVD (peripheral vascular disease) 10/31/2011  . Vitamin D deficiency   . RSD (reflex sympathetic dystrophy) 10/31/2011  . PAD (peripheral artery disease) 12/03/2011  . Cataract   . COPD (chronic obstructive pulmonary disease)     no per pt  . Heart murmur     as a child   Past Surgical History  Procedure Laterality Date  . Coronary artery bypass graft  10/2009    LIMA to the LAD, saphenous vein graft to the acute marginal, saphenous vein graft to the  diagonal and obtuse marginal.  . Abdominal hysterectomy  1977  . Thyroidectomy    . Tonsillectomy    . Carpal tunnel release    . Cataract extraction Left     reports that she quit smoking about 2 years ago. Her smoking use included Cigarettes. She has a 36 pack-year smoking history. She has never used smokeless tobacco. She reports that she does not drink alcohol or use illicit drugs. family history includes Arthritis in her maternal aunt; Diabetes in her maternal aunt; Heart disease in her maternal aunt; Hypertension in her maternal aunt. There is no history of Colon cancer, Kidney disease, Liver disease, Throat cancer, or Stomach cancer. No Known Allergies Current Outpatient Prescriptions on File Prior to Visit  Medication Sig Dispense Refill  . amLODipine (NORVASC) 10 MG tablet TAKE ONE TABLET BY MOUTH EVERY DAY  90 tablet  2  . aspirin 325 MG tablet Take 325 mg by mouth daily.        . Aspirin-Salicylamide-Caffeine (BC HEADACHE PO) Take by mouth as needed.      Marland Kitchen atorvastatin (LIPITOR) 40 MG tablet Take 1 tablet by mouth daily.      . bifidobacterium infantis (ALIGN) capsule Take 1 capsule by mouth daily.  28 capsule  0  .  Calcium Carb-Cholecalciferol (CALCIUM 1000 + D PO) Take by mouth daily.        . hydrochlorothiazide (HYDRODIURIL) 25 MG tablet Take 25 mg by mouth as needed.      Marland Kitchen levothyroxine (SYNTHROID, LEVOTHROID) 112 MCG tablet Take 112 mcg by mouth daily before breakfast.      . metoprolol tartrate (LOPRESSOR) 25 MG tablet Take 25 mg by mouth as needed.      . Multiple Vitamin (MULTIVITAMIN) capsule Take 1 capsule by mouth daily.        . pantoprazole (PROTONIX) 40 MG tablet Take one po BID x 3 weeks then one po daily  60 tablet  3  . potassium chloride SA (K-DUR,KLOR-CON) 20 MEQ tablet Take 1 tablet as needed      . PRENATAL VITAMINS PO Take 1 capsule by mouth daily.      . ramipril (ALTACE) 10 MG capsule Take 10 mg by mouth as needed.      . Vitamin D, Ergocalciferol,  (DRISDOL) 50000 UNITS CAPS Take 50,000 Units by mouth every 7 (seven) days.       No current facility-administered medications on file prior to visit.   Review of Systems  Constitutional: Negative for unexpected weight change, or unusual diaphoresis  HENT: Negative for tinnitus.   Eyes: Negative for photophobia and visual disturbance.  Respiratory: Negative for choking and stridor.   Gastrointestinal: Negative for vomiting and blood in stool.  Genitourinary: Negative for hematuria and decreased urine volume.  Musculoskeletal: Negative for acute joint swelling Skin: Negative for color change and wound.  Neurological: Negative for tremors and numbness other than noted  Psychiatric/Behavioral: Negative for decreased concentration or  hyperactivity.      Objective:   Physical Exam BP 138/80  Pulse 97  Temp(Src) 97 F (36.1 C) (Oral)  Ht 5\' 2"  (1.575 m)  Wt 137 lb 4 oz (62.256 kg)  BMI 25.1 kg/m2  SpO2 94% VS noted,  Constitutional: Pt appears well-developed and well-nourished.  HENT: Head: NCAT.  Right Ear: External ear normal.  Left Ear: External ear normal.  Bilat tm's with mild erythema.  Max sinus areas mild tender.  Pharynx with mild erythema, no exudate Eyes: Conjunctivae and EOM are normal. Pupils are equal, round, and reactive to light.  Neck: Normal range of motion. Neck supple.  Cardiovascular: Normal rate and regular rhythm.   Pulmonary/Chest: Effort normal and breath sounds normal.  Right knee with 1+ effusion, crepitus, warth diffuse, FROM, NT Neurological: Pt is alert. Not confused  Skin: Skin is warm. No erythema.  Psychiatric: Pt behavior is normal. Thought content normal.     Assessment & Plan:

## 2013-05-11 NOTE — Assessment & Plan Note (Signed)
Mild to mod, for depomedrol IM, then allegra/flonsae,  to f/u any worsening symptoms or concerns

## 2013-05-11 NOTE — Assessment & Plan Note (Signed)
With small effusion today, ongoing pain, and ? Worsening instability/tendency to fall but declines ortho or sports med in our office

## 2013-05-22 ENCOUNTER — Ambulatory Visit: Payer: Medicare Other | Admitting: Internal Medicine

## 2013-08-02 ENCOUNTER — Encounter: Payer: Self-pay | Admitting: Internal Medicine

## 2013-08-02 ENCOUNTER — Ambulatory Visit (INDEPENDENT_AMBULATORY_CARE_PROVIDER_SITE_OTHER): Payer: Medicare Other | Admitting: Internal Medicine

## 2013-08-02 VITALS — BP 138/82 | HR 83 | Temp 97.0°F | Ht 63.0 in | Wt 136.2 lb

## 2013-08-02 DIAGNOSIS — M25561 Pain in right knee: Secondary | ICD-10-CM

## 2013-08-02 DIAGNOSIS — Z23 Encounter for immunization: Secondary | ICD-10-CM

## 2013-08-02 DIAGNOSIS — M25569 Pain in unspecified knee: Secondary | ICD-10-CM

## 2013-08-02 DIAGNOSIS — J069 Acute upper respiratory infection, unspecified: Secondary | ICD-10-CM

## 2013-08-02 DIAGNOSIS — J309 Allergic rhinitis, unspecified: Secondary | ICD-10-CM

## 2013-08-02 DIAGNOSIS — I723 Aneurysm of iliac artery: Secondary | ICD-10-CM

## 2013-08-02 DIAGNOSIS — I739 Peripheral vascular disease, unspecified: Secondary | ICD-10-CM

## 2013-08-02 MED ORDER — METHYLPREDNISOLONE ACETATE 80 MG/ML IJ SUSP
80.0000 mg | Freq: Once | INTRAMUSCULAR | Status: AC
  Administered 2013-08-02: 80 mg via INTRAMUSCULAR

## 2013-08-02 MED ORDER — AZITHROMYCIN 250 MG PO TABS: ORAL_TABLET | ORAL | Status: DC

## 2013-08-02 NOTE — Patient Instructions (Signed)
You had the new Prevnar pneumonnia shot, and the steroid shot  Please take all new medication as prescribed - the antibiotic  Please continue all other medications as before, and refills have been done if requested.  Please have the pharmacy call with any other refills you may need.  You will be contacted regarding the referral for: leg circulation test (lower extremity arterial duplex), Dr Excell Seltzer (cardiology) for the circulation, and Dr Katrinka Blazing for the right knee  Please return in 3 months, or sooner if needed, with Lab testing done 3-5 days before

## 2013-08-02 NOTE — Progress Notes (Signed)
Subjective:    Patient ID: [name], female    DOB: 1946/04/05, 67 y.o.   MRN: [id number]  HPI   Here with 2-3 days acute onset fever, facial pain, pressure, headache, general weakness and malaise, and greenish d/c, with mild ST and cough, but pt denies chest pain, wheezing, increased sob or doe, orthopnea, PND, increased LE swelling, palpitations, dizziness or syncope. Does have several wks ongoing nasal allergy symptoms with clearish congestion, itch and sneezing, without fever, pain, ST, cough, swelling or wheezing.  Also mentions 2 mo onset RLE exertional calf pain, better to rest. 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  Also with recurring right knee pain/swelling not exertional Past Medical History  Diagnosis Date  . Coronary artery disease   . Hyperlipidemia   . History of rheumatic fever   . Anemia   . OSTEOPENIA   . Insomnia   . COLONIC POLYPS, HX OF   . Hypothyroidism   . Hypertension   . Gastroesophageal reflux disease   . Osteoarthritis   . DISC DISEASE, CERVICAL   . Anxiety   . PVD (peripheral vascular disease) 10/31/2011  . Vitamin D deficiency   . RSD (reflex sympathetic dystrophy) 10/31/2011  . PAD (peripheral artery disease) 12/03/2011  . Cataract   . COPD (chronic obstructive pulmonary disease)     no per pt  . Heart murmur     as a child   Past Surgical History  Procedure Laterality Date  . Coronary artery bypass graft  10/2009    LIMA to the LAD, saphenous vein graft to the acute marginal, saphenous vein graft to the diagonal and obtuse marginal.  . Abdominal hysterectomy  1977  . Thyroidectomy    . Tonsillectomy    . Carpal tunnel release    . Cataract extraction Left     reports that she quit smoking about 2 years ago. Her smoking use included Cigarettes. She has a 36 pack-year smoking history. She has never used smokeless tobacco. She reports that she does not drink alcohol or use illicit drugs. family history includes  Arthritis in her maternal aunt; Diabetes in her maternal aunt; Heart disease in her maternal aunt; Hypertension in her maternal aunt. There is no history of Colon cancer, Kidney disease, Liver disease, Throat cancer, or Stomach cancer. No Known Allergies Current Outpatient Prescriptions on File Prior to Visit  Medication Sig Dispense Refill  . amLODipine (NORVASC) 10 MG tablet TAKE ONE TABLET BY MOUTH EVERY DAY  90 tablet  2  . aspirin 325 MG tablet Take 325 mg by mouth daily.        . Aspirin-Salicylamide-Caffeine (BC HEADACHE PO) Take by mouth as needed.      Marland Kitchen atorvastatin (LIPITOR) 40 MG tablet Take 1 tablet by mouth daily.      . bifidobacterium infantis (ALIGN) capsule Take 1 capsule by mouth daily.  28 capsule  0  . Calcium Carb-Cholecalciferol (CALCIUM 1000 + D PO) Take by mouth daily.        . fexofenadine (ALLEGRA) 180 MG tablet Take 1 tablet (180 mg total) by mouth daily.  30 tablet  2  . fluticasone (FLONASE) 50 MCG/ACT nasal spray Place 2 sprays into the nose daily.  16 g  2  . hydrochlorothiazide (HYDRODIURIL) 25 MG tablet Take 25 mg by mouth as needed.      Marland Kitchen levothyroxine (SYNTHROID, LEVOTHROID) 112 MCG tablet Take 112 mcg by mouth daily before breakfast.      .  metoprolol tartrate (LOPRESSOR) 25 MG tablet Take 1 tablet (25 mg total) by mouth 2 (two) times daily as needed.  180 tablet  3  . Multiple Vitamin (MULTIVITAMIN) capsule Take 1 capsule by mouth daily.        . pantoprazole (PROTONIX) 40 MG tablet Take one po BID x 3 weeks then one po daily  60 tablet  3  . potassium chloride SA (K-DUR,KLOR-CON) 20 MEQ tablet Take 1 tablet as needed      . PRENATAL VITAMINS PO Take 1 capsule by mouth daily.      . ramipril (ALTACE) 10 MG capsule Take 1 capsule (10 mg total) by mouth daily.  90 capsule  3  . Vitamin D, Ergocalciferol, (DRISDOL) 50000 UNITS CAPS Take 50,000 Units by mouth every 7 (seven) days.       No current facility-administered medications on file prior to visit.    Review of Systems  Constitutional: Negative for unexpected weight change, or unusual diaphoresis  HENT: Negative for tinnitus.   Eyes: Negative for photophobia and visual disturbance.  Respiratory: Negative for choking and stridor.   Gastrointestinal: Negative for vomiting and blood in stool.  Genitourinary: Negative for hematuria and decreased urine volume.  Musculoskeletal: Negative for acute joint swelling Skin: Negative for color change and wound.  Neurological: Negative for tremors and numbness other than noted  Psychiatric/Behavioral: Negative for decreased concentration or  hyperactivity.       Objective:   Physical Exam BP 138/82  Pulse 83  Temp(Src) 97 F (36.1 C) (Oral)  Ht 5\' 3"  (1.6 m)  Wt 136 lb 4 oz (61.803 kg)  BMI 24.14 kg/m2  SpO2 97% VS noted, mild ill Constitutional: Pt appears well-developed and well-nourished.  HENT: Head: NCAT.  Right Ear: External ear normal.  Left Ear: External ear normal.  Bilat tm's with mild erythema.  Max sinus areas mild tender.  Pharynx with mild erythema, no exudate Eyes: Conjunctivae and EOM are normal. Pupils are equal, round, and reactive to light.  Neck: Normal range of motion. Neck supple.  Cardiovascular: Normal rate and regular rhythm.   Pulmonary/Chest: Effort normal and breath sounds normal.  Abd:  Soft, NT, non-distended, + BS Neurological: Pt is alert. Not confused  Skin: Skin is warm. No erythema.  Trace bilat dorsalis pedis Psychiatric: Pt behavior is normal. Thought content normal.     Assessment & Plan:

## 2013-08-02 NOTE — Progress Notes (Signed)
Pre-visit discussion using our clinic review tool. No additional management support is needed unless otherwise documented below in the visit note.  

## 2013-08-05 NOTE — Assessment & Plan Note (Signed)
Mild to mod, for steroid shot today, cont meds as is,  to f/u any worsening symptoms or concerns

## 2013-08-05 NOTE — Assessment & Plan Note (Signed)
With hx of iliac art aneurysm, for referral cardiology/dr cooper

## 2013-08-05 NOTE — Assessment & Plan Note (Signed)
Mild to mod, for antibx course,  to f/u any worsening symptoms or concerns 

## 2013-08-05 NOTE — Assessment & Plan Note (Signed)
Also to see Dr Katrinka Blazing, sport med in the office today

## 2013-08-07 ENCOUNTER — Ambulatory Visit (HOSPITAL_COMMUNITY): Payer: Medicare Other | Attending: Internal Medicine

## 2013-08-07 DIAGNOSIS — Z87891 Personal history of nicotine dependence: Secondary | ICD-10-CM | POA: Insufficient documentation

## 2013-08-07 DIAGNOSIS — I2510 Atherosclerotic heart disease of native coronary artery without angina pectoris: Secondary | ICD-10-CM | POA: Insufficient documentation

## 2013-08-07 DIAGNOSIS — E785 Hyperlipidemia, unspecified: Secondary | ICD-10-CM | POA: Insufficient documentation

## 2013-08-07 DIAGNOSIS — I723 Aneurysm of iliac artery: Secondary | ICD-10-CM

## 2013-08-07 DIAGNOSIS — I10 Essential (primary) hypertension: Secondary | ICD-10-CM | POA: Insufficient documentation

## 2013-08-07 DIAGNOSIS — I739 Peripheral vascular disease, unspecified: Secondary | ICD-10-CM

## 2013-08-07 DIAGNOSIS — I7092 Chronic total occlusion of artery of the extremities: Secondary | ICD-10-CM | POA: Insufficient documentation

## 2013-08-07 DIAGNOSIS — Z951 Presence of aortocoronary bypass graft: Secondary | ICD-10-CM | POA: Insufficient documentation

## 2013-08-07 DIAGNOSIS — I70219 Atherosclerosis of native arteries of extremities with intermittent claudication, unspecified extremity: Secondary | ICD-10-CM

## 2013-08-10 ENCOUNTER — Other Ambulatory Visit (INDEPENDENT_AMBULATORY_CARE_PROVIDER_SITE_OTHER): Payer: Medicare Other

## 2013-08-10 ENCOUNTER — Ambulatory Visit (INDEPENDENT_AMBULATORY_CARE_PROVIDER_SITE_OTHER): Payer: Medicare Other | Admitting: Family Medicine

## 2013-08-10 ENCOUNTER — Encounter: Payer: Self-pay | Admitting: Family Medicine

## 2013-08-10 VITALS — BP 132/72 | HR 81 | Wt 136.0 lb

## 2013-08-10 DIAGNOSIS — M25561 Pain in right knee: Secondary | ICD-10-CM

## 2013-08-10 DIAGNOSIS — M1710 Unilateral primary osteoarthritis, unspecified knee: Secondary | ICD-10-CM | POA: Insufficient documentation

## 2013-08-10 DIAGNOSIS — M1711 Unilateral primary osteoarthritis, right knee: Secondary | ICD-10-CM

## 2013-08-10 DIAGNOSIS — M25569 Pain in unspecified knee: Secondary | ICD-10-CM

## 2013-08-10 MED ORDER — AMITRIPTYLINE HCL 25 MG PO TABS: 25.0000 mg | ORAL_TABLET | Freq: Every day | ORAL | Status: DC

## 2013-08-10 NOTE — Patient Instructions (Addendum)
Very nice to meet you Try the exercises most days of the week.  Ice 20 minutes at end of day can help Wear brace with activity Come back and see me again in 4 weeks if not perfect.  Take tylenol 650 mg three times a day is the best evidence based medicine we have for arthritis.  Glucosamine sulfate 750mg  twice a day is a supplement that has been shown to help moderate to severe arthritis. Vitamin D 1000 IU daily Fish oil 2 grams daily.  Tumeric 500mg  twice daily.  Capsaicin topically up to four times a day may also help with pain. If cortisone injections do not help, there are different types of shots that may help but they take longer to take effect.  We can discuss this at follow up.  It's important that you continue to stay active. Controlling your weight is important.  Consider physical therapy to strengthen muscles around the joint that hurts to take pressure off of the joint itself. Shoe inserts with good arch support may be helpful.  [employer] orth[facility] at [employer] [facility] could help.  Water aerobics and cycling with low resistance are the best two types of exercise for arthritis.

## 2013-08-10 NOTE — Progress Notes (Signed)
  I'm seeing this patient by the request  of:  Oliver Barre, MD   CC: Right knee pain  HPI: Patient is a very pleasant 67 year old female with right knee pain. Patient has had this pain for multiple months duration. His original injury. Has been told in the past that she has had arthritis and did have a steroid injection greater than one year ago. Patient states now that she is having some mechanical symptoms with some giving out on her from time to time. Patient has not fallen though. Denies any swelling or any numbness. Still able to do activities of daily living. Does not wake her up at night pain 7/10. Has not tried any to modalities other than heat which was not beneficial.  Past medical, surgical, family and social history reviewed. Medications reviewed all in the electronic medical record.   Review of Systems: No headache, visual changes, nausea, vomiting, diarrhea, constipation, dizziness, abdominal pain, skin rash, fevers, chills, night sweats, weight loss, swollen lymph nodes, body aches, joint swelling, muscle aches, chest pain, shortness of breath, mood changes.   Objective:    Blood pressure 132/72, pulse 81, weight 136 lb (61.689 kg), SpO2 95.00%.   General: No apparent distress alert and oriented x3 mood and affect normal, dressed appropriately.  HEENT: Pupils equal, extraocular movements intact Respiratory: Patient's speak in full sentences and does not appear short of breath Cardiovascular: No lower extremity edema, non tender, no erythema Skin: Warm dry intact with no signs of infection or rash on extremities or on axial skeleton. Abdomen: Soft nontender Neuro: Cranial nerves II through XII are intact, neurovascularly intact in all extremities with 2+ DTRs and 2+ pulses. Lymph: No lymphadenopathy of posterior or anterior cervical chain or axillae bilaterally.  Gait normal with good balance and coordination.  MSK: Non tender with full range of motion and good stability and  symmetric strength and tone of shoulders, elbows, wrist, hip,  and ankles bilaterally.  Knee: Right On inspection some mild osteoarthritic changes Patient does have pain over the medial joint line ROM full in flexion and extension and lower leg rotation. Ligaments with solid consistent endpoints including ACL, PCL, LCL, MCL. Negative Mcmurray's, Apley's, and Thessalonian tests.  painful patellar compression. Patellar glide with moderate crepitus. Patellar and quadriceps tendons unremarkable. Hamstring and quadriceps strength is normal.   MSK US performed of: Right knee This study was ordered, performed, and interpreted by Terrilee Files D.O.  Knee: All structures visualized. Anteromedial, anterolateral, posteromedial, and posterolateral menisci unremarkable without tearing, fraying, effusion, or displacement. Patient though does have what appears to be a calcific foreign body that seems to move in and out of the joint capsule. Likely a very broken osteophyte or other bone spur. Patellar Tendon unremarkable on long and transverse views without effusion. No abnormality of prepatellar bursa. LCL and MCL unremarkable on long and transverse views. No abnormality of origin of medial or lateral head of the gastrocnemius.  IMPRESSION:  Calcific foreign body small 0.12 cm in diameter  After informed written and verbal consent, patient was seated on exam table. Right knee was prepped with alcohol swab and utilizing anterolateral approach, patient's right knee space was injected with 4:1  marcaine 0.5%: Kenalog 40mg /dL. Patient tolerated the procedure well without immediate complications.  Impression and Recommendations:     This case required medical decision making of moderate complexity.

## 2013-08-10 NOTE — Assessment & Plan Note (Signed)
Patient did have injection as described above. Patient also given a brace that was fitted by me today. Patient given home exercise program Patient will try anti-inflammatories over-the-counter medications that were suggested by me today to Patient comes again in 3-4 weeks for further evaluation. If she continues to have pain I would like to get x-rays we can consider Visco supplementation.

## 2013-08-10 NOTE — Progress Notes (Signed)
Pre-visit discussion using our clinic review tool. No additional management support is needed unless otherwise documented below in the visit note.  

## 2013-08-13 ENCOUNTER — Telehealth: Payer: Self-pay | Admitting: Family Medicine

## 2013-08-13 NOTE — Telephone Encounter (Signed)
Called pt, she is going to come to the office today to switch out her brace.

## 2013-08-13 NOTE — Telephone Encounter (Signed)
Pt called stated that the brace is too small, she request medium size. Please call pt

## 2013-08-17 ENCOUNTER — Encounter: Payer: Self-pay | Admitting: Cardiology

## 2013-08-17 ENCOUNTER — Ambulatory Visit (INDEPENDENT_AMBULATORY_CARE_PROVIDER_SITE_OTHER): Payer: Medicare Other | Admitting: Cardiology

## 2013-08-17 VITALS — BP 140/86 | HR 71 | Ht 62.0 in | Wt 134.0 lb

## 2013-08-17 DIAGNOSIS — I10 Essential (primary) hypertension: Secondary | ICD-10-CM

## 2013-08-17 DIAGNOSIS — I2510 Atherosclerotic heart disease of native coronary artery without angina pectoris: Secondary | ICD-10-CM

## 2013-08-17 DIAGNOSIS — I739 Peripheral vascular disease, unspecified: Secondary | ICD-10-CM

## 2013-08-17 DIAGNOSIS — E785 Hyperlipidemia, unspecified: Secondary | ICD-10-CM

## 2013-08-17 NOTE — Assessment & Plan Note (Signed)
She continues with right lower extremity claudication.previous arteriogram was recommended but she did not followup as she was "afraid". She has an appointment to see Dr Kirke Corin soon for followup. Continue aspirin and statin.

## 2013-08-17 NOTE — Assessment & Plan Note (Signed)
Continue statin. Lipids and liver function by primary care.

## 2013-08-17 NOTE — Assessment & Plan Note (Signed)
Blood pressure mildly elevated but she follows this at home and it is typically controlled. Continue present medications.

## 2013-08-17 NOTE — Patient Instructions (Addendum)
Your physician wants you to follow-up in: ONE YEAR WITH DR Shelda Pal will receive a reminder letter in the mail two months in advance. If you don't receive a letter, please call our office to schedule the follow-up appointment.   FOLLOW UP APPOINTMENT NEXT AVAILABLE WITH DR Kirke Corin

## 2013-08-17 NOTE — Progress Notes (Signed)
HPI: FU coronary artery disease. Patient had a Myoview in March of 2012 which showed anterior ischemia. Subsequent cardiac catheterization revealed an ejection fraction of 40%, and significant LAD/obtuse marginal disease. The patient underwent coronary artery bypass and graft in March 2012 with a LIMA to the LAD, a sequential saphenous vein graft to the diagonal and obtuse marginal and a saphenous vein graft to the acute marginal. Fu echo in August of 2012 showed an EF of 50-55, atrial septal aneurysm. Patient was seen by Dr. Kirke Corin for claudication and abnormal ABIs in April of 2013. There was suggestion of occlusion of the distal right SFA. Repeat Dopplers in December of 2014 showed a right iliac disease, chronically occluded right SFA and moderate disease in the left SFA. I last saw her in Nov 2013. Since then, she has some dyspnea on exertion but no orthopnea, PND, pedal edema, syncope or chest pain. She continues to have significant right lower extremity claudication.  Current Outpatient Prescriptions  Medication Sig Dispense Refill  . amitriptyline (ELAVIL) 25 MG tablet Take 1 tablet (25 mg total) by mouth at bedtime.  30 tablet  3  . amLODipine (NORVASC) 10 MG tablet TAKE ONE TABLET BY MOUTH EVERY DAY  90 tablet  2  . aspirin 325 MG tablet Take 325 mg by mouth daily.        . Aspirin-Salicylamide-Caffeine (BC HEADACHE PO) Take by mouth as needed.      Marland Kitchen atorvastatin (LIPITOR) 40 MG tablet Take 1 tablet by mouth daily.      . bifidobacterium infantis (ALIGN) capsule Take 1 capsule by mouth daily.  28 capsule  0  . Calcium Carb-Cholecalciferol (CALCIUM 1000 + D PO) Take by mouth daily.        . fexofenadine (ALLEGRA) 180 MG tablet Take 1 tablet (180 mg total) by mouth daily.  30 tablet  2  . fluticasone (FLONASE) 50 MCG/ACT nasal spray Place 2 sprays into the nose daily.  16 g  2  . hydrochlorothiazide (HYDRODIURIL) 25 MG tablet Take 25 mg by mouth as needed.      Marland Kitchen levothyroxine  (SYNTHROID, LEVOTHROID) 112 MCG tablet Take 112 mcg by mouth daily before breakfast.      . metoprolol tartrate (LOPRESSOR) 25 MG tablet Take 25 mg by mouth 2 (two) times daily.      . Multiple Vitamin (MULTIVITAMIN) capsule Take 1 capsule by mouth daily.        . pantoprazole (PROTONIX) 40 MG tablet Take one po BID x 3 weeks then one po daily  60 tablet  3  . potassium chloride SA (K-DUR,KLOR-CON) 20 MEQ tablet Take 1 tablet as needed      . PRENATAL VITAMINS PO Take 1 capsule by mouth daily.      . ramipril (ALTACE) 10 MG capsule Take 1 capsule (10 mg total) by mouth daily.  90 capsule  3  . Vitamin D, Ergocalciferol, (DRISDOL) 50000 UNITS CAPS Take 50,000 Units by mouth every 7 (seven) days.       No current facility-administered medications for this visit.     Past Medical History  Diagnosis Date  . Coronary artery disease   . Hyperlipidemia   . History of rheumatic fever   . Anemia   . OSTEOPENIA   . Insomnia   . COLONIC POLYPS, HX OF   . Hypothyroidism   . Hypertension   . Gastroesophageal reflux disease   . Osteoarthritis   . DISC DISEASE, CERVICAL   .  Anxiety   . PVD (peripheral vascular disease) 10/31/2011  . Vitamin D deficiency   . RSD (reflex sympathetic dystrophy) 10/31/2011  . PAD (peripheral artery disease) 12/03/2011  . Cataract   . COPD (chronic obstructive pulmonary disease)     no per pt  . Heart murmur     as a child    Past Surgical History  Procedure Laterality Date  . Coronary artery bypass graft  10/2009    LIMA to the LAD, saphenous vein graft to the acute marginal, saphenous vein graft to the diagonal and obtuse marginal.  . Abdominal hysterectomy  1977  . Thyroidectomy    . Tonsillectomy    . Carpal tunnel release    . Cataract extraction Left     History   Social History  . Marital Status: Single    Spouse Name: N/A    Number of Children: 2  . Years of Education: N/A   Occupational History  . [employer]    Social History Main Topics    . Smoking status: Former Smoker -- 0.80 packs/day for 45 years    Types: Cigarettes    Quit date: 11/05/2010  . Smokeless tobacco: Never Used  . Alcohol Use: No  . Drug Use: No  . Sexual Activity: Not Currently   Other Topics Concern  . Not on file   Social History Narrative  . No narrative on file    ROS: no fevers or chills, productive cough, hemoptysis, dysphasia, odynophagia, melena, hematochezia, dysuria, hematuria, rash, seizure activity, orthopnea, PND, pedal edema, claudication. Remaining systems are negative.  Physical Exam: Well-developed well-nourished in no acute distress.  Skin is warm and dry.  HEENT is normal.  Neck is supple.  Chest is clear to auscultation with normal expansion.  Cardiovascular exam is regular rate and rhythm.  Abdominal exam nontender or distended. No masses palpated. Extremities show no edema. neuro grossly intact  ECG sinus rhythm at a rate of 71. No significant ST changes. Poor R wave progression.

## 2013-08-17 NOTE — Assessment & Plan Note (Signed)
Continue aspirin and statin. 

## 2013-08-28 ENCOUNTER — Ambulatory Visit (INDEPENDENT_AMBULATORY_CARE_PROVIDER_SITE_OTHER): Payer: Medicare Other | Admitting: Cardiovascular Disease

## 2013-08-28 ENCOUNTER — Encounter: Payer: Self-pay | Admitting: Cardiovascular Disease

## 2013-08-28 VITALS — BP 149/88 | HR 79 | Ht 63.0 in | Wt 135.0 lb

## 2013-08-28 DIAGNOSIS — I739 Peripheral vascular disease, unspecified: Secondary | ICD-10-CM

## 2013-08-28 DIAGNOSIS — E785 Hyperlipidemia, unspecified: Secondary | ICD-10-CM

## 2013-08-28 DIAGNOSIS — I10 Essential (primary) hypertension: Secondary | ICD-10-CM

## 2013-08-28 DIAGNOSIS — I2510 Atherosclerotic heart disease of native coronary artery without angina pectoris: Secondary | ICD-10-CM

## 2013-08-28 NOTE — Assessment & Plan Note (Signed)
She reports no symptoms suggestive of angina.

## 2013-08-28 NOTE — Assessment & Plan Note (Signed)
Lab Results  Component Value Date   CHOL 207* 11/10/2012   HDL 62.90 11/10/2012   LDLCALC 66 01/13/2011   LDLDIRECT 126.5 11/10/2012   TRIG 112.0 11/10/2012   CHOLHDL 3 11/10/2012   Continue treatment with atorvastatin and consider a target LDL of less than 70.

## 2013-08-28 NOTE — Patient Instructions (Signed)
Your physician wants you to follow-up in: 6 MONTHS with Dr Fletcher Anon. You will receive a reminder letter in the mail two months in advance. If you don't receive a letter, please call our office to schedule the follow-up appointment.  Your physician recommends that you continue on your current medications as directed. Please refer to the Current Medication list given to you today.

## 2013-08-28 NOTE — Progress Notes (Signed)
HPI  This is a 68 year old female who is here today for a followup visit regarding PAD.  The patient has known history of coronary artery disease status post coronary artery bypass graft surgery in 2012 with ejection fraction of 50-55%. She has been stable from a cardiac standpoint. She does describe bilateral calf discomfort worse on the right side . She is known to have an occluded right SFA. She has been treated medically given that her claudication was not severe. She was started on Pletal but did not tolerate the medication due to reported lower extremity swelling.  She had worsening claudication last year. I suggested proceeding with angiography and possible endovascular intervention. However, the patient declined. She reports slight worsening of claudication. She injured her right knee recently and currently using her brace. She is able to walk for about one fourth of a block before she gets right calf claudication. It is no rest pain or ulceration. She has chronic constipation and abdominal pain. She denies any chest pain. Dyspnea is stable.  No Known Allergies   Current Outpatient Prescriptions on File Prior to Visit  Medication Sig Dispense Refill  . amitriptyline (ELAVIL) 25 MG tablet Take 1 tablet (25 mg total) by mouth at bedtime.  30 tablet  3  . amLODipine (NORVASC) 10 MG tablet TAKE ONE TABLET BY MOUTH EVERY DAY  90 tablet  2  . aspirin 325 MG tablet Take 325 mg by mouth daily.        . Aspirin-Salicylamide-Caffeine (BC HEADACHE PO) Take by mouth as needed.      Marland Kitchen atorvastatin (LIPITOR) 40 MG tablet Take 1 tablet by mouth daily.      . bifidobacterium infantis (ALIGN) capsule Take 1 capsule by mouth daily.  28 capsule  0  . Calcium Carb-Cholecalciferol (CALCIUM 1000 + D PO) Take by mouth daily.        . fexofenadine (ALLEGRA) 180 MG tablet Take 1 tablet (180 mg total) by mouth daily.  30 tablet  2  . fluticasone (FLONASE) 50 MCG/ACT nasal spray Place 2 sprays into the nose  daily.  16 g  2  . hydrochlorothiazide (HYDRODIURIL) 25 MG tablet Take 25 mg by mouth as needed.      Marland Kitchen levothyroxine (SYNTHROID, LEVOTHROID) 112 MCG tablet Take 112 mcg by mouth daily before breakfast.      . metoprolol tartrate (LOPRESSOR) 25 MG tablet Take 25 mg by mouth 2 (two) times daily.      . Multiple Vitamin (MULTIVITAMIN) capsule Take 1 capsule by mouth daily.        . pantoprazole (PROTONIX) 40 MG tablet Take one po BID x 3 weeks then one po daily  60 tablet  3  . potassium chloride SA (K-DUR,KLOR-CON) 20 MEQ tablet Take 1 tablet as needed      . PRENATAL VITAMINS PO Take 1 capsule by mouth daily.      . ramipril (ALTACE) 10 MG capsule Take 1 capsule (10 mg total) by mouth daily.  90 capsule  3  . Vitamin D, Ergocalciferol, (DRISDOL) 50000 UNITS CAPS Take 50,000 Units by mouth every 7 (seven) days.       No current facility-administered medications on file prior to visit.     Past Medical History  Diagnosis Date  . Coronary artery disease   . Hyperlipidemia   . History of rheumatic fever   . Anemia   . OSTEOPENIA   . Insomnia   . COLONIC POLYPS, HX OF   .  Hypothyroidism   . Hypertension   . Gastroesophageal reflux disease   . Osteoarthritis   . DISC DISEASE, CERVICAL   . Anxiety   . PVD (peripheral vascular disease) 10/31/2011  . Vitamin D deficiency   . RSD (reflex sympathetic dystrophy) 10/31/2011  . PAD (peripheral artery disease) 12/03/2011  . Cataract   . COPD (chronic obstructive pulmonary disease)     no per pt  . Heart murmur     as a child     Past Surgical History  Procedure Laterality Date  . Coronary artery bypass graft  10/2009    LIMA to the LAD, saphenous vein graft to the acute marginal, saphenous vein graft to the diagonal and obtuse marginal.  . Abdominal hysterectomy  1977  . Thyroidectomy    . Tonsillectomy    . Carpal tunnel release    . Cataract extraction Left      Family History  Problem Relation Age of Onset  . Arthritis  Maternal Aunt   . Heart disease Maternal Aunt   . Hypertension Maternal Aunt   . Diabetes Maternal Aunt   . Colon cancer Neg Hx   . Kidney disease Neg Hx   . Liver disease Neg Hx   . Throat cancer Neg Hx   . Stomach cancer Neg Hx      History   Social History  . Marital Status: Single    Spouse Name: N/A    Number of Children: 2  . Years of Education: N/A   Occupational History  . [employer]    Social History Main Topics  . Smoking status: Former Smoker -- 0.80 packs/day for 45 years    Types: Cigarettes    Quit date: 11/05/2010  . Smokeless tobacco: Never Used  . Alcohol Use: No  . Drug Use: No  . Sexual Activity: Not Currently   Other Topics Concern  . Not on file   Social History Narrative  . No narrative on file      PHYSICAL EXAM   BP 149/88  Pulse 79  Ht 5\' 3"  (1.6 m)  Wt 135 lb (61.236 kg)  BMI 23.92 kg/m2  Constitutional: She is oriented to person, place, and time. She appears well-developed and well-nourished. No distress.  HENT: No nasal discharge.  Head: Normocephalic and atraumatic.  Eyes: Pupils are equal and round. Right eye exhibits no discharge. Left eye exhibits no discharge.  Neck: Normal range of motion. Neck supple. No JVD present. No thyromegaly present.  Cardiovascular: Normal rate, regular rhythm, normal heart sounds. Exam reveals no gallop and no friction rub. No murmur heard.  Pulmonary/Chest: Effort normal and breath sounds normal. No stridor. No respiratory distress. She has no wheezes. She has no rales. She exhibits no tenderness.  Abdominal: Soft. Bowel sounds are normal. She exhibits no distension. There is no tenderness. There is no rebound and no guarding.  Musculoskeletal: Normal range of motion. She exhibits no edema and no tenderness.  Neurological: She is alert and oriented to person, place, and time. Coordination normal.  Skin: Skin is warm and dry. No rash noted. She is not diaphoretic. No erythema. No pallor.    Psychiatric: She has a normal mood and affect. Her behavior is normal. Judgment and thought content normal.  Vascular: Femoral pulses are normal bilaterally. Distal pulses are not palpable on the right side and faint on the left side.    ASSESSMENT AND PLAN

## 2013-08-28 NOTE — Assessment & Plan Note (Signed)
The patient has moderate to severe right calf claudication with known occluded right distal SFA . Recent ABI was 0.53 down from 0.61. She did not tolerate treatment with Pletal. There is no evidence of critical limb ischemia. I again discussed with the patient the option of proceeding with angiography and possible endovascular intervention. She does not want to proceed with this unless her symptoms worsen. There is no absolute indication to proceed at this time. I discussed with him the importance of starting a regular walking program in order to improve her walking distance. She is otherwise in good medical therapy. I asked her to followup in 6 months from now or earlier if needed.

## 2013-08-28 NOTE — Assessment & Plan Note (Signed)
Blood pressure is reasonably controlled on current medications. 

## 2013-09-10 ENCOUNTER — Ambulatory Visit: Payer: Medicare Other | Admitting: Family Medicine

## 2013-11-28 ENCOUNTER — Ambulatory Visit: Payer: Medicare Other

## 2013-11-28 ENCOUNTER — Ambulatory Visit (INDEPENDENT_AMBULATORY_CARE_PROVIDER_SITE_OTHER): Payer: Medicare Other | Admitting: Emergency Medicine

## 2013-11-28 VITALS — BP 172/94 | HR 76 | Temp 98.0°F | Resp 16 | Ht 61.5 in | Wt 135.4 lb

## 2013-11-28 DIAGNOSIS — M25561 Pain in right knee: Secondary | ICD-10-CM

## 2013-11-28 DIAGNOSIS — M25562 Pain in left knee: Principal | ICD-10-CM

## 2013-11-28 DIAGNOSIS — R350 Frequency of micturition: Secondary | ICD-10-CM | POA: Diagnosis not present

## 2013-11-28 DIAGNOSIS — M25569 Pain in unspecified knee: Secondary | ICD-10-CM

## 2013-11-28 LAB — POCT UA - MICROSCOPIC ONLY
Casts, Ur, LPF, POC: NEGATIVE
Crystals, Ur, HPF, POC: NEGATIVE
Mucus, UA: POSITIVE
Yeast, UA: NEGATIVE

## 2013-11-28 LAB — POCT URINALYSIS DIPSTICK
Bilirubin, UA: NEGATIVE
Glucose, UA: NEGATIVE
Ketones, UA: NEGATIVE
Leukocytes, UA: NEGATIVE
Nitrite, UA: NEGATIVE
Protein, UA: 100
Spec Grav, UA: 1.025
Urobilinogen, UA: 0.2
pH, UA: 6

## 2013-11-28 LAB — GLUCOSE, POCT (MANUAL RESULT ENTRY): POC Glucose: 99 mg/dl (ref 70–99)

## 2013-11-28 MED ORDER — DICLOFENAC SODIUM 1 % TD GEL: 2.0000 g | Freq: Four times a day (QID) | TRANSDERMAL | Status: DC

## 2013-11-28 NOTE — Progress Notes (Addendum)
Subjective:     Patient ID: [name], female   DOB: 1946-08-23, 68 y.o.   MRN: [id number]  HPI 68 YO AA female presents to [facility] with left sided lower extremity pain since last Wednesday from her knee all the way down to her ankle. She states there was no known injury to the leg and that she tried an Epson salt bath with no relief and also takes aspirin daily, but has not tried any other medication for the pain. Walking makes the pain worse and it is somewhat relieved with rest. She has a hard time characterizing the pain only that it is extremely painful. She hasn't had the pain this bad before and says the pain is roughly a 9/10. Patient has previously had cortisone injection in the right knee.  Review of Systems    Patient admits to right lower extremity pain from her knee down to her ankle. She denies any sensation, temperature, or strength change in the leg. She denies chest pain, shortness of breath, N/V/D, visual change, weakness, or change in vision or hearing. Objective:   Physical Exam Gen: alert and oriented x 3 in no acute distress Cardio: regular rate and rhythm, no murmur, gallop or rub Pulm: CBTA no wheeze rhonchi or rales GI: soft non tender, active bowel sounds,no organomegaly, no rebound/gaurding Musculo: tenderness to palpation or medial and lateral joint line of the left knee, mild fluid accumulation in the joint space, 4/5 strength in flexion/extension/abduction/adduction of the knee and ankle, appropriate sensation of the extremity, 1+ dorsalis pedis pulses bilaterally Psych: appropriate mood and affect    [facility] reading (PRIMARY) by  Dr. Everlene Farrier the right knee shows a narrow medial joint space. Left knee shows a narrow medial joint space with a vascular clip in the suspected bipartite patella.  Results for orders placed in visit on 11/28/13  GLUCOSE, POCT (MANUAL RESULT ENTRY)      Result Value Ref Range   POC Glucose 99  70 - 99 mg/dl  POCT URINALYSIS DIPSTICK   Result Value Ref Range   Color, UA DARK YELLOW     Clarity, UA CLEAR     Glucose, UA NEG     Bilirubin, UA NEG     Ketones, UA NEG     Spec Grav, UA 1.025     Blood, UA TRACE-INTACT     pH, UA 6.0     Protein, UA 100     Urobilinogen, UA 0.2     Nitrite, UA NEG     Leukocytes, UA Negative    POCT UA - MICROSCOPIC ONLY      Result Value Ref Range   WBC, Ur, HPF, POC 2-4     RBC, urine, microscopic 3-5     Bacteria, U Microscopic TRACE     Mucus, UA POSITIVE     Epithelial cells, urine per micros 1-2     Crystals, Ur, HPF, POC NEG     Casts, Ur, LPF, POC NEG     Yeast, UA NEG      Assessment:    1) left lower extremity pain likely 2/2 osteoarthritis or meniscal degeneration  2)polyuria 3) polydipsia    Plan:    1) 2 view x-rays of both knee for comparison view . She is referred to an orthopedist for evaluation. She has a brace at home she can wear. She was prescribed Voltaren Gel.

## 2013-11-28 NOTE — Progress Notes (Deleted)
Subjective:     Patient ID: [name], female   DOB: 1945-10-28, 68 y.o.   MRN: [id number]  Knee Pain      Review of Systems     Objective:   Physical Exam     Assessment:        Plan:

## 2013-11-29 ENCOUNTER — Ambulatory Visit (INDEPENDENT_AMBULATORY_CARE_PROVIDER_SITE_OTHER): Payer: Medicare Other | Admitting: Emergency Medicine

## 2013-11-29 VITALS — BP 142/82 | HR 72 | Temp 98.3°F | Resp 18 | Wt 135.0 lb

## 2013-11-29 DIAGNOSIS — M25569 Pain in unspecified knee: Secondary | ICD-10-CM

## 2013-11-29 DIAGNOSIS — M25562 Pain in left knee: Principal | ICD-10-CM

## 2013-11-29 DIAGNOSIS — M25561 Pain in right knee: Secondary | ICD-10-CM

## 2013-11-29 LAB — URINE CULTURE
Colony Count: NO GROWTH
Organism ID, Bacteria: NO GROWTH

## 2013-11-29 MED ORDER — MELOXICAM 15 MG PO TABS: 15.0000 mg | ORAL_TABLET | Freq: Every day | ORAL | Status: DC

## 2013-11-29 NOTE — Progress Notes (Signed)
[facility] [address], [place] [address] [phone]  Date:  11/29/2013   Name:  [name]   DOB:  13-Jun-1946   MRN:  [id number]  PCP:  [name], MD    Chief Complaint: Knee Pain   History of Present Illness:  [name] is a 68 y.o. very pleasant female patient who presents with the following:  Pain in left leg started 10 days ago.  Says no history of injury or overuse.  Says pain worsening.  Came here for a "cortisone shot".  Was seen yesterday and did not want a shot but today says the cost of the gel was too high for her.  She denies swelling in her knee.  Has pain in the leg from the hip to the ankle.  Says when she gets up her knee sometimes clicks but does not lock.  No improvement with over the counter medications or other home remedies. Denies other complaint or health concern today. No back pain  Patient Active Problem List   Diagnosis Date Noted  . Primary localized osteoarthrosis, lower leg 08/10/2013  . Right knee pain 08/02/2013  . Acute upper respiratory infections of unspecified site 08/02/2013  . Dyspepsia 11/16/2012  . Knee pain, right 02/18/2012  . PAD (peripheral artery disease) 12/03/2011  . PVD (peripheral vascular disease) 10/31/2011  . RSD (reflex sympathetic dystrophy) 10/31/2011  . Vitamin d deficiency   . Preop exam for internal medicine 10/28/2011  . Anxiety 03/11/2011  . COPD (chronic obstructive pulmonary disease) 02/02/2011  . Anemia 02/02/2011  . History of rheumatic fever 02/02/2011  . Insomnia 02/02/2011  . CAD 09/08/2010  . CONSTIPATION 02/19/2010  . TOBACCO ABUSE 07/21/2009  . MURMUR 06/17/2009  . [place] DISEASE, CERVICAL 03/20/2009  . Pain in Soft Tissues of Limb 05/23/2008  . HYPOTHYROIDISM 09/07/2007  . HYPERLIPIDEMIA 09/07/2007  . HYPERTENSION 09/07/2007  . ALLERGIC RHINITIS 09/07/2007  . GERD 09/07/2007  . OSTEOARTHRITIS 09/07/2007  . OSTEOPENIA 09/07/2007  . COLONIC POLYPS, HX OF  09/07/2007    Past Medical History  Diagnosis Date  . Coronary artery disease   . Hyperlipidemia   . History of rheumatic fever   . Anemia   . OSTEOPENIA   . Insomnia   . COLONIC POLYPS, HX OF   . Hypothyroidism   . Hypertension   . Gastroesophageal reflux disease   . Osteoarthritis   . DISC DISEASE, CERVICAL   . Anxiety   . PVD (peripheral vascular disease) 10/31/2011  . Vitamin D deficiency   . RSD (reflex sympathetic dystrophy) 10/31/2011  . PAD (peripheral artery disease) 12/03/2011  . Cataract   . COPD (chronic obstructive pulmonary disease)     no per pt  . Heart murmur     as a child    Past Surgical History  Procedure Laterality Date  . Coronary artery bypass graft  10/2009    LIMA to the LAD, saphenous vein graft to the acute marginal, saphenous vein graft to the diagonal and obtuse marginal.  . Abdominal hysterectomy  1977  . Thyroidectomy    . Tonsillectomy    . Carpal tunnel release    . Cataract extraction Left     History  Substance Use Topics  . Smoking status: Former Smoker -- 0.80 packs/day for 45 years    Types: Cigarettes    Quit date: 11/05/2010  . Smokeless tobacco: Never Used  . Alcohol Use: No    Family History  Problem  Relation Age of Onset  . Arthritis Maternal Aunt   . Heart disease Maternal Aunt   . Hypertension Maternal Aunt   . Diabetes Maternal Aunt   . Colon cancer Neg Hx   . Kidney disease Neg Hx   . Liver disease Neg Hx   . Throat cancer Neg Hx   . Stomach cancer Neg Hx     No Known Allergies  Medication list has been reviewed and updated.  Current Outpatient Prescriptions on File Prior to Visit  Medication Sig Dispense Refill  . amitriptyline (ELAVIL) 25 MG tablet Take 1 tablet (25 mg total) by mouth at bedtime.  30 tablet  3  . amLODipine (NORVASC) 10 MG tablet TAKE ONE TABLET BY MOUTH EVERY DAY  90 tablet  2  . aspirin 325 MG tablet Take 325 mg by mouth daily.        . Aspirin-Salicylamide-Caffeine (BC HEADACHE PO)  Take by mouth as needed.      Marland Kitchen atorvastatin (LIPITOR) 40 MG tablet Take 1 tablet by mouth daily.      . bifidobacterium infantis (ALIGN) capsule Take 1 capsule by mouth daily.  28 capsule  0  . Calcium Carb-Cholecalciferol (CALCIUM 1000 + D PO) Take by mouth daily.        . fexofenadine (ALLEGRA) 180 MG tablet Take 1 tablet (180 mg total) by mouth daily.  30 tablet  2  . fluticasone (FLONASE) 50 MCG/ACT nasal spray Place 2 sprays into the nose daily.  16 g  2  . hydrochlorothiazide (HYDRODIURIL) 25 MG tablet Take 25 mg by mouth as needed.      Marland Kitchen levothyroxine (SYNTHROID, LEVOTHROID) 112 MCG tablet Take 112 mcg by mouth daily before breakfast.      . metoprolol tartrate (LOPRESSOR) 25 MG tablet Take 25 mg by mouth 2 (two) times daily.      . Multiple Vitamin (MULTIVITAMIN) capsule Take 1 capsule by mouth daily.        . pantoprazole (PROTONIX) 40 MG tablet Take one po BID x 3 weeks then one po daily  60 tablet  3  . potassium chloride SA (K-DUR,KLOR-CON) 20 MEQ tablet Take 1 tablet as needed      . PRENATAL VITAMINS PO Take 1 capsule by mouth daily.      . ramipril (ALTACE) 10 MG capsule Take 1 capsule (10 mg total) by mouth daily.  90 capsule  3  . Vitamin D, Ergocalciferol, (DRISDOL) 50000 UNITS CAPS Take 50,000 Units by mouth every 7 (seven) days.      . diclofenac sodium (VOLTAREN) 1 % GEL Apply 2 g topically 4 (four) times daily.  100 g  1   No current facility-administered medications on file prior to visit.    Review of Systems:  As per HPI, otherwise negative.    Physical Examination: Filed Vitals:   11/29/13 1436  BP: 142/82  Pulse: 72  Temp: 98.3 F (36.8 C)  Resp: 18   Filed Vitals:   11/29/13 1436  Weight: 135 lb (61.236 kg)   Body mass index is 25.1 kg/(m^2). Ideal Body Weight:     GEN: WDWN, NAD, Non-toxic, Alert & Oriented x 3 HEENT: Atraumatic, Normocephalic.  Ears and Nose: No external deformity. EXTR: No clubbing/cyanosis/edema NEURO: Normal gait.   PSYCH: Normally interactive. Conversant. Not depressed or anxious appearing.  Calm demeanor.  LEFT knee:  Slight effusion.  Tender knee joint. No deformity.  No crepitus.  Limits active flexion to 90 degrees.  Joint stable.  Assessment and Plan: Left knee pain mobic Await ortho  Signed,  Ellison Carwin, MD

## 2013-12-18 ENCOUNTER — Ambulatory Visit (INDEPENDENT_AMBULATORY_CARE_PROVIDER_SITE_OTHER): Payer: Medicare Other | Admitting: Cardiovascular Disease

## 2013-12-18 ENCOUNTER — Encounter: Payer: Self-pay | Admitting: Cardiovascular Disease

## 2013-12-18 VITALS — BP 148/82 | HR 90 | Ht 61.5 in | Wt 137.2 lb

## 2013-12-18 DIAGNOSIS — I2510 Atherosclerotic heart disease of native coronary artery without angina pectoris: Secondary | ICD-10-CM | POA: Diagnosis not present

## 2013-12-18 DIAGNOSIS — I739 Peripheral vascular disease, unspecified: Secondary | ICD-10-CM

## 2013-12-18 NOTE — Progress Notes (Signed)
HPI  This is a 67 year old female who is here today for a followup visit regarding PAD.  The patient has known history of coronary artery disease status post coronary artery bypass graft surgery in 2012 with ejection fraction of 50-55%. She has been stable from a cardiac standpoint. She is followed for right calf claudication due to occluded right SFA. She did not tolerate Pletal due to lower extremity swelling.  She is complaining of recent left knee discomfort and left leg pain. She went to urgent care and had x-rays done which showed arthritis. She is supposed to followup with orthopedics. She is concerned that the left knee pain might be due to PAD. She does have moderate disease in the left SFA which was not obstructive.  No Known Allergies   Current Outpatient Prescriptions on File Prior to Visit  Medication Sig Dispense Refill  . amLODipine (NORVASC) 10 MG tablet TAKE ONE TABLET BY MOUTH EVERY DAY  90 tablet  2  . aspirin 325 MG tablet Take 325 mg by mouth daily.        . Aspirin-Salicylamide-Caffeine (BC HEADACHE PO) Take by mouth as needed.      . bifidobacterium infantis (ALIGN) capsule Take 1 capsule by mouth daily.  28 capsule  0  . Calcium Carb-Cholecalciferol (CALCIUM 1000 + D PO) Take by mouth daily.        . fexofenadine (ALLEGRA) 180 MG tablet Take 1 tablet (180 mg total) by mouth daily.  30 tablet  2  . fluticasone (FLONASE) 50 MCG/ACT nasal spray Place 2 sprays into the nose daily.  16 g  2  . hydrochlorothiazide (HYDRODIURIL) 25 MG tablet Take 25 mg by mouth as needed.      Marland Kitchen levothyroxine (SYNTHROID, LEVOTHROID) 112 MCG tablet Take 112 mcg by mouth daily before breakfast.      . meloxicam (MOBIC) 15 MG tablet Take 1 tablet (15 mg total) by mouth daily.  30 tablet  1  . metoprolol tartrate (LOPRESSOR) 25 MG tablet Take 25 mg by mouth 2 (two) times daily.      . Multiple Vitamin (MULTIVITAMIN) capsule Take 1 capsule by mouth daily.        . pantoprazole (PROTONIX) 40  MG tablet Take one po BID x 3 weeks then one po daily  60 tablet  3  . potassium chloride SA (K-DUR,KLOR-CON) 20 MEQ tablet Take 1 tablet as needed      . PRENATAL VITAMINS PO Take 1 capsule by mouth daily.      . ramipril (ALTACE) 10 MG capsule Take 1 capsule (10 mg total) by mouth daily.  90 capsule  3  . Vitamin D, Ergocalciferol, (DRISDOL) 50000 UNITS CAPS Take 50,000 Units by mouth every 7 (seven) days.       No current facility-administered medications on file prior to visit.     Past Medical History  Diagnosis Date  . Coronary artery disease   . Hyperlipidemia   . History of rheumatic fever   . Anemia   . OSTEOPENIA   . Insomnia   . COLONIC POLYPS, HX OF   . Hypothyroidism   . Hypertension   . Gastroesophageal reflux disease   . Osteoarthritis   . DISC DISEASE, CERVICAL   . Anxiety   . PVD (peripheral vascular disease) 10/31/2011  . Vitamin D deficiency   . RSD (reflex sympathetic dystrophy) 10/31/2011  . PAD (peripheral artery disease) 12/03/2011  . Cataract   . COPD (chronic obstructive pulmonary  disease)     no per pt  . Heart murmur     as a child     Past Surgical History  Procedure Laterality Date  . Coronary artery bypass graft  10/2009    LIMA to the LAD, saphenous vein graft to the acute marginal, saphenous vein graft to the diagonal and obtuse marginal.  . Abdominal hysterectomy  1977  . Thyroidectomy    . Tonsillectomy    . Carpal tunnel release    . Cataract extraction Left      Family History  Problem Relation Age of Onset  . Arthritis Maternal Aunt   . Heart disease Maternal Aunt   . Hypertension Maternal Aunt   . Diabetes Maternal Aunt   . Colon cancer Neg Hx   . Kidney disease Neg Hx   . Liver disease Neg Hx   . Throat cancer Neg Hx   . Stomach cancer Neg Hx      History   Social History  . Marital Status: Single    Spouse Name: N/A    Number of Children: 2  . Years of Education: N/A   Occupational History  . [employer]     Social History Main Topics  . Smoking status: Former Smoker -- 0.80 packs/day for 45 years    Types: Cigarettes    Quit date: 11/05/2010  . Smokeless tobacco: Never Used  . Alcohol Use: No  . Drug Use: No  . Sexual Activity: Not Currently   Other Topics Concern  . Not on file   Social History Narrative  . No narrative on file      PHYSICAL EXAM   BP 148/82  Pulse 90  Ht 5' 1.5" (1.562 m)  Wt 137 lb 3.2 oz (62.234 kg)  BMI 25.51 kg/m2  SpO2 98%  Constitutional: She is oriented to person, place, and time. She appears well-developed and well-nourished. No distress.  HENT: No nasal discharge.  Head: Normocephalic and atraumatic.  Eyes: Pupils are equal and round. Right eye exhibits no discharge. Left eye exhibits no discharge.  Neck: Normal range of motion. Neck supple. No JVD present. No thyromegaly present.  Cardiovascular: Normal rate, regular rhythm, normal heart sounds. Exam reveals no gallop and no friction rub. No murmur heard.  Pulmonary/Chest: Effort normal and breath sounds normal. No stridor. No respiratory distress. She has no wheezes. She has no rales. She exhibits no tenderness.  Abdominal: Soft. Bowel sounds are normal. She exhibits no distension. There is no tenderness. There is no rebound and no guarding.  Musculoskeletal: Normal range of motion. She exhibits no edema and no tenderness.  Neurological: She is alert and oriented to person, place, and time. Coordination normal.  Skin: Skin is warm and dry. No rash noted. She is not diaphoretic. No erythema. No pallor.  Psychiatric: She has a normal mood and affect. Her behavior is normal. Judgment and thought content normal.  Vascular: Femoral pulses are normal bilaterally. Distal pulses are not palpable on the right side and faint on the left side.    ASSESSMENT AND PLAN

## 2013-12-18 NOTE — Patient Instructions (Signed)
Your physician recommends that you schedule a follow-up appointment in: 3 MONTHS with Dr Fletcher Anon  Your physician recommends that you continue on your current medications as directed. Please refer to the Current Medication list given to you today.

## 2013-12-20 NOTE — Assessment & Plan Note (Signed)
The patient is having left knee joint pain which is likely due to arthritis. I explained that this is not due to peripheral arterial disease. The obstructive disease she has involved the right SFA. She does have mild right calf claudication but that's not the limiting issue at this point. Continue medical therapy. I advised her to keep her appointment with orthopedics for left knee pain. Followup with me in few months.

## 2014-01-02 DIAGNOSIS — M1710 Unilateral primary osteoarthritis, unspecified knee: Secondary | ICD-10-CM | POA: Diagnosis not present

## 2014-01-19 ENCOUNTER — Ambulatory Visit: Payer: Medicare Other

## 2014-01-19 ENCOUNTER — Other Ambulatory Visit: Payer: Self-pay | Admitting: Emergency Medicine

## 2014-01-19 ENCOUNTER — Ambulatory Visit (INDEPENDENT_AMBULATORY_CARE_PROVIDER_SITE_OTHER): Payer: Medicare Other | Admitting: Emergency Medicine

## 2014-01-19 VITALS — BP 130/80 | HR 81 | Temp 97.7°F | Resp 16 | Ht 61.25 in | Wt 135.0 lb

## 2014-01-19 DIAGNOSIS — M79609 Pain in unspecified limb: Secondary | ICD-10-CM

## 2014-01-19 DIAGNOSIS — R918 Other nonspecific abnormal finding of lung field: Secondary | ICD-10-CM

## 2014-01-19 DIAGNOSIS — M5412 Radiculopathy, cervical region: Secondary | ICD-10-CM | POA: Diagnosis not present

## 2014-01-19 DIAGNOSIS — R202 Paresthesia of skin: Secondary | ICD-10-CM

## 2014-01-19 DIAGNOSIS — M79602 Pain in left arm: Secondary | ICD-10-CM

## 2014-01-19 DIAGNOSIS — R9389 Abnormal findings on diagnostic imaging of other specified body structures: Secondary | ICD-10-CM

## 2014-01-19 DIAGNOSIS — R209 Unspecified disturbances of skin sensation: Secondary | ICD-10-CM | POA: Diagnosis not present

## 2014-01-19 MED ORDER — GABAPENTIN 100 MG PO CAPS: ORAL_CAPSULE | ORAL | Status: DC

## 2014-01-19 NOTE — Progress Notes (Addendum)
Subjective:    Patient ID: [name], female    DOB: [birth year], 68 y.o.   MRN: [id number]  HPI Chief Complaint  Patient presents with  . Numbness    in (L) arm x 1 month  . Nausea    x 1 month   This chart was scribed for [name], MD by Thea Alken, ED Scribe. This patient was seen in room 1 and the patient's care was started at 10:09 AM.  HPI Comments: [name] is a 68 y.o. female who presents to the [facility] complaining of radiating left hand pain onset 3 weeks. Pt describes the pain as constant numbness in left hand and arm with intermittent sharp shooting pain that radiates up left arm to left shoulder. Pt reports pain worsens at night. She reports she elevates left hand at night without relief to pain. Pt also reports tenderness to neck. Pt reports surgery to right hand 8 years ago and states right hand is weak. Pt reports surgery was due to work comp injury.  Pt had bypass surgery 4 years. Pt is a former smoker who quit 65 years ago. Pt reports it been a little over a year since CT scan. She reports scan was due to frequent HA.    Past Medical History  Diagnosis Date  . Coronary artery disease   . Hyperlipidemia   . History of rheumatic fever   . Anemia   . OSTEOPENIA   . Insomnia   . COLONIC POLYPS, HX OF   . Hypothyroidism   . Hypertension   . Gastroesophageal reflux disease   . Osteoarthritis   . DISC DISEASE, CERVICAL   . Anxiety   . PVD (peripheral vascular disease) 10/31/2011  . Vitamin D deficiency   . RSD (reflex sympathetic dystrophy) 10/31/2011  . PAD (peripheral artery disease) 12/03/2011  . Cataract   . COPD (chronic obstructive pulmonary disease)     no per pt  . Heart murmur     as a child   No Known Allergies Prior to Admission medications   Medication Sig Start Date End Date Taking? Authorizing Provider  amLODipine (NORVASC) 10 MG tablet TAKE ONE TABLET BY MOUTH EVERY DAY 04/20/13  Yes Biagio Borg, MD    aspirin 325 MG tablet Take 325 mg by mouth daily.     Yes Historical Provider, MD  Aspirin-Salicylamide-Caffeine (BC HEADACHE PO) Take by mouth as needed.   Yes Historical Provider, MD  bifidobacterium infantis (ALIGN) capsule Take 1 capsule by mouth daily. 12/05/12  Yes Amy S Esterwood, PA-C  Calcium Carb-Cholecalciferol (CALCIUM 1000 + D PO) Take by mouth daily.     Yes Historical Provider, MD  fexofenadine (ALLEGRA) 180 MG tablet Take 1 tablet (180 mg total) by mouth daily. 05/11/13  Yes Biagio Borg, MD  fluticasone (FLONASE) 50 MCG/ACT nasal spray Place 2 sprays into the nose daily. 05/11/13  Yes Biagio Borg, MD  hydrochlorothiazide (HYDRODIURIL) 25 MG tablet Take 25 mg by mouth as needed. 10/28/11  Yes Biagio Borg, MD  levothyroxine (SYNTHROID, LEVOTHROID) 112 MCG tablet Take 112 mcg by mouth daily before breakfast.   Yes Historical Provider, MD  meloxicam (MOBIC) 15 MG tablet Take 1 tablet (15 mg total) by mouth daily. 11/29/13  Yes Ellison Carwin, MD  metoprolol tartrate (LOPRESSOR) 25 MG tablet Take 25 mg by mouth 2 (two) times daily. 05/11/13  Yes Biagio Borg, MD  Multiple Vitamin (MULTIVITAMIN) capsule Take  1 capsule by mouth daily.     Yes Historical Provider, MD  pantoprazole (PROTONIX) 40 MG tablet Take one po BID x 3 weeks then one po daily 12/05/12  Yes Amy S Esterwood, PA-C  potassium chloride SA (K-DUR,KLOR-CON) 20 MEQ tablet Take 1 tablet as needed   Yes Historical Provider, MD  PRENATAL VITAMINS PO Take 1 capsule by mouth daily.   Yes Historical Provider, MD  ramipril (ALTACE) 10 MG capsule Take 1 capsule (10 mg total) by mouth daily. 05/11/13  Yes Biagio Borg, MD  Vitamin D, Ergocalciferol, (DRISDOL) 50000 UNITS CAPS Take 50,000 Units by mouth every 7 (seven) days.   Yes Historical Provider, MD   Review of Systems  Musculoskeletal: Positive for myalgias.  Neurological: Positive for weakness and numbness.      Objective:   Physical Exam CONSTITUTIONAL: Well developed/well  nourished HEAD: Normocephalic/atraumatic EYES: EOMI/PERRL ENMT: Mucous membranes moist NECK: supple no meningeal signs, tenderness over C7-T1 along the midline, DTR of left arm are 3+ SPINE:entire spine nontender CV: S1/S2 noted, no murmurs/rubs/gallops noted LUNGS: Lungs are clear to auscultation bilaterally, no apparent distress ABDOMEN: soft, nontender, no rebound or guarding GU:no cva tenderness NEURO: Pt is awake/alert, moves all extremitiesx4 EXTREMITIES: pulses normal, full ROM, flexion deformity of right hand SKIN: warm, color normal PSYCH: no abnormalities of mood noted  [facility] reading (PRIMARY) done by Dr. Everlene Farrier -   EKG reading - unchanged from previous, first degree block and poor anterior R wave progression.   C-spine X-ray- carotid calcification, questionable lesion to right upper lobe, C3-C4 degenerative disc disease .  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 Everlene Farrier 1 view chest x-ray apical lordotic does not reveal a definite right upper lobe mass    CXR-  Apical scaring and multi surgical clips from previous thyroidectomy   Meds ordered this encounter  Medications  . gabapentin (NEURONTIN) 100 MG capsule    Sig: Start with 2 capsules at night for the first 3 days and then 1-2 capsules in the morning and 2 capsules at night.    Dispense:  120 capsule    Refill:  3      Assessment & Plan:   1. Arm pain, left   2. Paresthesia of arm    Meds ordered this encounter  Medications  . gabapentin (NEURONTIN) 100 MG capsule    Sig: Start with 2 capsules at night for the first 3 days and then 1-2 capsules in the morning and 2 capsules at night.    Dispense:  120 capsule    Refill:  3   The patient has seve cervical spine disease. I suspect this is the source of her pain. I will go ahead with a carotid studies and CT of the head. As well as a CT of the chest to followup on her questionable  abnormality in the right apex. I suspect eventually she will need an MRI of her neck..  I personally performed the services described in this documentation, which was scribed in my presence. The recorded information has been reviewed and is accurate.

## 2014-01-19 NOTE — Patient Instructions (Signed)
Cervical Radiculopathy  Cervical radiculopathy happens when a nerve in the neck is pinched or bruised by a slipped (herniated) disk or by arthritic changes in the bones of the cervical spine. This can occur due to an injury or as part of the normal aging process. Pressure on the cervical nerves can cause pain or numbness that runs from your neck all the way down into your arm and fingers.  CAUSES   There are many possible causes, including:  · Injury.  · Muscle tightness in the neck from overuse.  · Swollen, painful joints (arthritis).  · Breakdown or degeneration in the bones and joints of the spine (spondylosis) due to aging.  · Bone spurs that may develop near the cervical nerves.  SYMPTOMS   Symptoms include pain, weakness, or numbness in the affected arm and hand. Pain can be severe or irritating. Symptoms may be worse when extending or turning the neck.  DIAGNOSIS   Your caregiver will ask about your symptoms and do a physical exam. He or she may test your strength and reflexes. X-rays, CT scans, and MRI scans may be needed in cases of injury or if the symptoms do not go away after a period of time. Electromyography (EMG) or nerve conduction testing may be done to study how your nerves and muscles are working.  TREATMENT   Your caregiver may recommend certain exercises to help relieve your symptoms. Cervical radiculopathy can, and often does, get better with time and treatment. If your problems continue, treatment options may include:  · Wearing a soft collar for short periods of time.  · Physical therapy to strengthen the neck muscles.  · Medicines, such as nonsteroidal anti-inflammatory drugs (NSAIDs), oral corticosteroids, or spinal injections.  · Surgery. Different types of surgery may be done depending on the cause of your problems.  HOME CARE INSTRUCTIONS   · Put ice on the affected area.  · Put ice in a plastic bag.  · Place a towel between your skin and the bag.  · Leave the ice on for 15-20 minutes,  03-04 times a day or as directed by your caregiver.  · If ice does not help, you can try using heat. Take a warm shower or bath, or use a hot water bottle as directed by your caregiver.  · You may try a gentle neck and shoulder massage.  · Use a flat pillow when you sleep.  · Only take over-the-counter or prescription medicines for pain, discomfort, or fever as directed by your caregiver.  · If physical therapy was prescribed, follow your caregiver's directions.  · If a soft collar was prescribed, use it as directed.  SEEK IMMEDIATE MEDICAL CARE IF:   · Your pain gets much worse and cannot be controlled with medicines.  · You have weakness or numbness in your hand, arm, face, or leg.  · You have a high fever or a stiff, rigid neck.  · You lose bowel or bladder control (incontinence).  · You have trouble with walking, balance, or speaking.  MAKE SURE YOU:   · Understand these instructions.  · Will watch your condition.  · Will get help right away if you are not doing well or get worse.  Document Released: 05/04/2001 Document Revised: 11/01/2011 Document Reviewed: 03/23/2011  [employer]® Patient Information ©2014 [employer].

## 2014-01-21 ENCOUNTER — Other Ambulatory Visit: Payer: Self-pay

## 2014-01-21 NOTE — Addendum Note (Signed)
Addended by: Sunday Spillers on: 01/21/2014 01:57 PM   Modules accepted: Orders

## 2014-01-29 ENCOUNTER — Telehealth: Payer: Self-pay | Admitting: *Deleted

## 2014-01-29 ENCOUNTER — Ambulatory Visit
Admission: RE | Admit: 2014-01-29 | Discharge: 2014-01-29 | Disposition: A | Discharge status: Home / Self Care | Payer: Medicare Other | Source: Ambulatory Visit | Attending: Emergency Medicine | Admitting: Emergency Medicine

## 2014-01-29 DIAGNOSIS — R918 Other nonspecific abnormal finding of lung field: Secondary | ICD-10-CM | POA: Diagnosis not present

## 2014-01-29 DIAGNOSIS — R9389 Abnormal findings on diagnostic imaging of other specified body structures: Secondary | ICD-10-CM

## 2014-01-29 DIAGNOSIS — R202 Paresthesia of skin: Secondary | ICD-10-CM

## 2014-01-29 DIAGNOSIS — Z1231 Encounter for screening mammogram for malignant neoplasm of breast: Secondary | ICD-10-CM | POA: Diagnosis not present

## 2014-01-29 DIAGNOSIS — I6529 Occlusion and stenosis of unspecified carotid artery: Secondary | ICD-10-CM | POA: Diagnosis not present

## 2014-01-29 DIAGNOSIS — M79602 Pain in left arm: Secondary | ICD-10-CM

## 2014-01-29 NOTE — Telephone Encounter (Signed)
[facility] called in to notify us results on CT Scan shows irregulat opacity in the periphery of the right apex. This is changed from a previous scan. Neoplasm can not be ruled out. Further study recommended. Please advise.

## 2014-01-29 NOTE — Telephone Encounter (Signed)
Dr Everlene Farrier has spoken to patient. He has ordered PET scan, I have made her an appointment to discuss with Dr Everlene Farrier

## 2014-01-29 NOTE — Telephone Encounter (Signed)
Please go ahead and schedule a PET/CT. Please call the patient and let her know there is an abnormal area and it in the right apex which needs further evaluation. Also go ahead and put in a referral to see Dr. Roxan Hockey who is a thoracic surgery and and make the appointment after  PET scan. I would be happy to discuss these findings with her. If she would like to speak with me let me know and I will call her directly

## 2014-01-29 NOTE — Telephone Encounter (Signed)
Dr Everlene Farrier - please see CT report on this patient.  Radiology recommends PET/CT

## 2014-02-01 ENCOUNTER — Telehealth: Payer: Self-pay

## 2014-02-01 NOTE — Telephone Encounter (Signed)
Called pt and gave her the appt info w/Dr Roxan Hockey along w/address and phone #. Pt agreed to appt.

## 2014-02-01 NOTE — Telephone Encounter (Signed)
Message copied by Dallas Schimke on Fri Feb 01, 2014 12:10 PM ------      Message from: Jethro Bolus A      Created: Fri Feb 01, 2014  8:45 AM                   ----- Message -----         From: Ned Clines         Sent: 01/31/2014   4:09 PM           To: Belva Chimes, LPN            Patient set up for 6/19 at 3pm, can you call patient and let them know.  thanks      ----- Message -----         From: Melrose Nakayama, MD         Sent: 01/31/2014   1:53 PM           To: Ned Clines            I can see her the afternoon of the 19th after her PET            [facility]      ----- Message -----         From: Ned Clines         Sent: 01/31/2014  11:31 AM           To: Melrose Nakayama, MD            I have a referral that requested to see you.  When could you see these patient?            Montone,Donnalee dob 12/18/1945  Lung mass, [id number], former patient in 2012. PET scheduled for 6/19.            Thanks                         ------

## 2014-02-05 ENCOUNTER — Encounter: Payer: Self-pay | Admitting: Internal Medicine

## 2014-02-05 DIAGNOSIS — M1710 Unilateral primary osteoarthritis, unspecified knee: Secondary | ICD-10-CM | POA: Diagnosis not present

## 2014-02-08 ENCOUNTER — Other Ambulatory Visit: Payer: Self-pay | Admitting: Internal Medicine

## 2014-02-08 ENCOUNTER — Encounter: Payer: Self-pay | Admitting: Thoracic Surgery (Cardiothoracic Vascular Surgery)

## 2014-02-08 ENCOUNTER — Institutional Professional Consult (permissible substitution) (INDEPENDENT_AMBULATORY_CARE_PROVIDER_SITE_OTHER): Payer: Medicare Other | Admitting: Thoracic Surgery (Cardiothoracic Vascular Surgery)

## 2014-02-08 ENCOUNTER — Telehealth: Payer: Self-pay | Admitting: Emergency Medicine

## 2014-02-08 ENCOUNTER — Encounter (HOSPITAL_COMMUNITY)
Admission: RE | Admit: 2014-02-08 | Discharge: 2014-02-08 | Disposition: A | Discharge status: Home / Self Care | Payer: Medicare Other | Source: Ambulatory Visit | Attending: Emergency Medicine | Admitting: Emergency Medicine

## 2014-02-08 VITALS — BP 135/83 | HR 95 | Resp 20 | Ht 61.25 in | Wt 135.0 lb

## 2014-02-08 DIAGNOSIS — R918 Other nonspecific abnormal finding of lung field: Secondary | ICD-10-CM

## 2014-02-08 DIAGNOSIS — N200 Calculus of kidney: Secondary | ICD-10-CM | POA: Diagnosis not present

## 2014-02-08 DIAGNOSIS — I723 Aneurysm of iliac artery: Secondary | ICD-10-CM | POA: Insufficient documentation

## 2014-02-08 DIAGNOSIS — I2510 Atherosclerotic heart disease of native coronary artery without angina pectoris: Secondary | ICD-10-CM | POA: Insufficient documentation

## 2014-02-08 DIAGNOSIS — R222 Localized swelling, mass and lump, trunk: Secondary | ICD-10-CM | POA: Insufficient documentation

## 2014-02-08 DIAGNOSIS — K229 Disease of esophagus, unspecified: Secondary | ICD-10-CM

## 2014-02-08 DIAGNOSIS — J438 Other emphysema: Secondary | ICD-10-CM | POA: Diagnosis not present

## 2014-02-08 DIAGNOSIS — R9389 Abnormal findings on diagnostic imaging of other specified body structures: Secondary | ICD-10-CM

## 2014-02-08 LAB — GLUCOSE, CAPILLARY: Glucose-Capillary: 143 mg/dL — ABNORMAL HIGH (ref 70–99)

## 2014-02-08 MED ORDER — FLUDEOXYGLUCOSE F - 18 (FDG) INJECTION
10.3000 | Freq: Once | INTRAVENOUS | Status: AC | PRN
  Administered 2014-02-08: 10.3 via INTRAVENOUS

## 2014-02-08 NOTE — Telephone Encounter (Signed)
Referral done

## 2014-02-08 NOTE — Telephone Encounter (Signed)
Call patient tell her there were some abnormal areas in the esophagus on her PET scan. I would like her to return to clinic to have this evaluated. She could also have this evaluated by her GI doctor if she chooses.

## 2014-02-08 NOTE — Progress Notes (Signed)
PCP is [name], MD Referring Provider is Daub, Loura Back, MD  Chief Complaint  Patient presents with  . Lung Mass    Surgical eval, PET Scan 02/08/14, Chest CT 01/29/14    HPI: [name] is a 68 year old woman who is sent for consultation regarding a possible right apical lung mass.  [name] is a 68 year old woman with a past history of tobacco abuse. She smoked about a pack of cigarettes a day for 40-45 years. She has a history of coronary disease and I did coronary bypass grafting on her 4 years ago. She has not had any cardiac issues since then.  She recently was being evaluated for left arm pain and numbness. Workup has shown cervical spine disease. One of her films a question of an apical mass was noted. This led to a CT of the chest. It showed bilateral apical parenchymal densities. The one on the right had enlarged since her last scan. Today she had a PET/CT and is now here for evaluation.  In addition to her left arm and hand complaints, her other biggest complaint is reflux symptoms. She's tried Nexium and Protonix and both have provided minimal relief.   Past Medical History  Diagnosis Date  . Coronary artery disease   . Hyperlipidemia   . History of rheumatic fever   . Anemia   . OSTEOPENIA   . Insomnia   . COLONIC POLYPS, HX OF   . Hypothyroidism   . Hypertension   . Gastroesophageal reflux disease   . Osteoarthritis   . DISC DISEASE, CERVICAL   . Anxiety   . PVD (peripheral vascular disease) 10/31/2011  . Vitamin D deficiency   . RSD (reflex sympathetic dystrophy) 10/31/2011  . PAD (peripheral artery disease) 12/03/2011  . Cataract   . COPD (chronic obstructive pulmonary disease)     no per pt  . Heart murmur     as a child    Past Surgical History  Procedure Laterality Date  . Coronary artery bypass graft  10/2009    LIMA to the LAD, saphenous vein graft to the acute marginal, saphenous vein graft to the diagonal and obtuse marginal.  . Abdominal  hysterectomy  1977  . Thyroidectomy    . Tonsillectomy    . Carpal tunnel release    . Cataract extraction Left     Family History  Problem Relation Age of Onset  . Arthritis Maternal Aunt   . Heart disease Maternal Aunt   . Hypertension Maternal Aunt   . Diabetes Maternal Aunt   . Colon cancer Neg Hx   . Kidney disease Neg Hx   . Liver disease Neg Hx   . Throat cancer Neg Hx   . Stomach cancer Neg Hx     Social History History  Substance Use Topics  . Smoking status: Former Smoker -- 0.80 packs/day for 45 years    Types: Cigarettes    Quit date: 11/05/2010  . Smokeless tobacco: Never Used  . Alcohol Use: No    Current Outpatient Prescriptions  Medication Sig Dispense Refill  . amLODipine (NORVASC) 10 MG tablet TAKE ONE TABLET BY MOUTH EVERY DAY  90 tablet  2  . aspirin 325 MG tablet Take 325 mg by mouth daily.        . Aspirin-Salicylamide-Caffeine (BC HEADACHE PO) Take by mouth as needed.      . bifidobacterium infantis (ALIGN) capsule Take 1 capsule by mouth daily.  28 capsule  0  . Calcium  Carb-Cholecalciferol (CALCIUM 1000 + D PO) Take by mouth daily.        . fexofenadine (ALLEGRA) 180 MG tablet Take 1 tablet (180 mg total) by mouth daily.  30 tablet  2  . fluticasone (FLONASE) 50 MCG/ACT nasal spray Place 2 sprays into the nose daily.  16 g  2  . gabapentin (NEURONTIN) 100 MG capsule Start with 2 capsules at night for the first 3 days and then 1-2 capsules in the morning and 2 capsules at night.  120 capsule  3  . hydrochlorothiazide (HYDRODIURIL) 25 MG tablet Take 25 mg by mouth as needed.      Marland Kitchen levothyroxine (SYNTHROID, LEVOTHROID) 112 MCG tablet Take 112 mcg by mouth daily before breakfast.      . meloxicam (MOBIC) 15 MG tablet Take 1 tablet (15 mg total) by mouth daily.  30 tablet  1  . metoprolol tartrate (LOPRESSOR) 25 MG tablet Take 25 mg by mouth 2 (two) times daily.      . Multiple Vitamin (MULTIVITAMIN) capsule Take 1 capsule by mouth daily.        .  pantoprazole (PROTONIX) 40 MG tablet Take one po BID x 3 weeks then one po daily  60 tablet  3  . potassium chloride SA (K-DUR,KLOR-CON) 20 MEQ tablet Take 1 tablet as needed      . PRENATAL VITAMINS PO Take 1 capsule by mouth daily.      . ramipril (ALTACE) 10 MG capsule Take 1 capsule (10 mg total) by mouth daily.  90 capsule  3  . Vitamin D, Ergocalciferol, (DRISDOL) 50000 UNITS CAPS Take 50,000 Units by mouth every 7 (seven) days.       No current facility-administered medications for this visit.    No Known Allergies  Review of Systems  Constitutional: Positive for fatigue and unexpected weight change (weight gain).  HENT: Positive for dental problem.   Respiratory: Negative for shortness of breath.   Cardiovascular: Negative for chest pain.  Gastrointestinal:       Severe reflux, hiccups  Musculoskeletal: Positive for arthralgias.  All other systems reviewed and are negative.   BP 135/83  Pulse 95  Resp 20  Ht 5' 1.25" (1.556 m)  Wt 135 lb (61.236 kg)  BMI 25.29 kg/m2  SpO2 96% Physical Exam  Vitals reviewed. Constitutional: She is oriented to person, place, and time. She appears well-developed and well-nourished. No distress.  HENT:  Head: Normocephalic and atraumatic.  Eyes: EOM are normal. Pupils are equal, round, and reactive to light.  Neck: Neck supple. No thyromegaly present.  Cardiovascular: Normal rate, regular rhythm and normal heart sounds.  Exam reveals no friction rub.   No murmur heard. Pulmonary/Chest: Effort normal and breath sounds normal. She has no wheezes. She has no rales.  Abdominal: Soft. There is no tenderness.  Musculoskeletal:  Deformity distal right upper extremity  Lymphadenopathy:    She has no cervical adenopathy.  Neurological: She is alert and oriented to person, place, and time. No cranial nerve deficit.  Skin: Skin is warm and dry.     Diagnostic Tests: CT CHEST WITHOUT CONTRAST  TECHNIQUE:  Multidetector CT imaging of the  chest was performed following the  standard protocol without IV contrast.  COMPARISON: Cervical spine radiographs and chest radiographs Jan 19, 2014 ; chest CT November 05, 2009  FINDINGS:  There is persistent scarring in the apices with underlying  paraseptal and centrilobular emphysema. In the right apex on axial  slice 8  series 4, there is a somewhat nodular appearing lesion  measuring 1.3 x 1.4 cm. This appearance represents a change from the  prior CT examination.  On axial slice 11, series 4, there is a 6 mm nodular opacity in the  posterior right upper lobe near the apex which is stable compared to  the prior study. There is a 5 mm nodular opacity in the posterior  segment of the right upper lobe on slice 18 series 4 which is stable  compared to the prior CT. There is a 3 mm nodular opacity in the  posterior segment right upper lobe on slice 20 series 4, also  stable. On axial slice 27 series 4, there is a 3 mm nodular opacity  in the anterior segment of right upper lobe with a nearby 2 mm  nodular opacity, stable from prior study. There is a 2 mm granuloma  in the medial segment of the right middle lobe on slice 36. On axial  slice 45 series 4, there is a stable 6 mm nodular lesion in the  lateral segment of the right lower lobe. There is a stable 2 mm  nodular lesion in the superior segment of the left lower lobe on  axial slice 32 series 4.  There is no edema or consolidation.  There is atherosclerotic change in the aorta. There are multiple  foci of coronary artery calcification. Patient is status post  coronary artery bypass grafting. There is no appreciable thoracic  adenopathy. The pericardium is not thickened.  There is a small hiatal hernia.  In the visualized upper abdomen, there is extensive atherosclerotic  change. There are small nonobstructing calculi in the upper pole  left kidney. No adrenal lesions are identified.  There is degenerative change in the thoracic  spine. There is  thoracic dextroscoliosis. There are no blastic or lytic bone  lesions. Patient appears to have had previous thyroidectomy.  IMPRESSION:  There is a somewhat irregular opacity in the periphery of the right  apex which represents a change from prior study. While this area may  represent progression of scarring, and early neoplasm cannot be  excluded based on this CT examination. Advise correlation with  nuclear medicine PET/ CT examination to further evaluate.  There is underlying emphysema. Multiple subcentimeter nodular  opacities remain stable compared to prior CT examination.  No adenopathy. No edema or consolidation. Small hiatal hernia  present. Patient is status post thyroidectomy.  These results will be called to the ordering clinician or  representative by the Radiologist Assistant, and communication  documented in the PACS or zVision Dashboard.  Electronically Signed  By: Lowella Grip M.D.  On: 01/29/2014 08:24  NUCLEAR MEDICINE PET SKULL BASE TO THIGH  TECHNIQUE:  10.3 mCi F-18 FDG was injected intravenously. Full-ring PET imaging  was performed from the skull base to thigh after the radiotracer. CT  data was obtained and used for attenuation correction and anatomic  localization.  FASTING BLOOD GLUCOSE: Value: 143 mg/dl  COMPARISON: Chest CT 01/29/2014.  FINDINGS:  NECK  No hypermetabolic lymph nodes in the neck.  CHEST  The area of concern in the apex of the right upper lobe again  demonstrates a focal area of pleuroparenchymal architectural  distortion. This area demonstrates no convincing hypermetabolism in  excess of that seen with the contralateral side, and is favored to  represent an area of chronic scarring. There is a background of mild  diffuse bronchial wall thickening with mild centrilobular and  paraseptal emphysema,  most pronounced in apices of the lungs  bilaterally. No other suspicious appearing pulmonary nodules or  masses. No  hypermetabolic mediastinal or hilar nodes. Heart size is  borderline enlarged. There is atherosclerosis of the thoracic aorta,  the great vessels of the mediastinum and the coronary arteries,  including calcified atherosclerotic plaque in the left main, left  anterior descending, left circumflex and right coronary arteries.  Status post median sternotomy for CABG with LIMA to the LAD. Mild  thickening of the distal esophagus with some low-level  hypermetabolism (SUVmax = 4.0).  ABDOMEN/PELVIS  No abnormal hypermetabolic activity within the liver, pancreas,  adrenal glands, or spleen. No hypermetabolic lymph nodes in the  abdomen or pelvis. 1.6 cm low-attenuation lesion in segment 6 of the  liver demonstrates no hypermetabolism, and although incompletely  characterized it is similar to prior studies at which point it was  compatible with a simple cyst. Multiple tiny nonobstructive calculi  in the left renal collecting system ranging in size from 2-4 mm.  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 Numerous  colonic diverticulae are noted, most pronounced in the right side of  the colon, without surrounding inflammatory changes to suggest an  acute diverticulitis at this time. Status post hysterectomy. Ovaries  are not confidently identified may be surgically absent or atrophic.  Urinary bladder is unremarkable in appearance.  SKELETON  No focal hypermetabolic activity to suggest skeletal metastasis.  IMPRESSION:  1. The area of concern in the apex of the right lung is strongly  favored to represent an area of evolving pleuroparenchymal scarring.  This is only slightly asymmetric with the contralateral side. No  suspicious appearing pulmonary nodules or masses are identified on  today's examination.  2. However, there is some mild circumferential thickening of the  distal esophagus with some low-level  hypermetabolism. This may  simply reflect reflux esophagitis, however, if there is any clinical  concern for Barrett's metaplasia or esophageal neoplasia, further  evaluation with endoscopy may be warranted.  3. Multiple nonobstructive calculi ranging from 3-4 mm within the  left renal collecting system.  4. 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  5. Mild diffuse bronchial wall thickening with mild centrilobular  and paraseptal emphysema.  6. Additional findings, as above.  Electronically Signed  By: Vinnie Langton M.D.  On: 02/08/2014 14:56   Impression: 68 year old woman with a past history of tobacco abuse within opacity at the right apex. She says bilateral apical scarring and some bleb formation. The right side appeared a more nodular than it had on her previous scan. On PET CT the radiologist feels strongly that this is likely just scarring and not likely to be a malignancy.  I discussed the results of the PET CT with Mrs. Pann. Based on the findings on PET and the radiologist felt that this is not suspicious enough think there is no indication for biopsy or surgical resection at this time. However, this area is not totally devoid of metabolic activity and certainly has progressed since 2011. Therefore I recommended to her that we repeat a CT scan in about 6 months just to make sure that this is not continuing to progress. She is agreeable to that recommendation.  Did advise her to stay with Dr. Everlene Farrier about possibly see a gastroenterologist for reflux symptoms. She did have some thickening  in hypermetabolism in the distal esophagus and an upper endoscopy might be warranted.  Plan: She will followup with primary regarding reflux  I will see her back in 6 months with a repeat CT of the chest

## 2014-02-09 NOTE — Telephone Encounter (Signed)
Pt advised and will RTC tomorrow.

## 2014-02-10 ENCOUNTER — Telehealth: Payer: Self-pay

## 2014-02-10 ENCOUNTER — Ambulatory Visit (INDEPENDENT_AMBULATORY_CARE_PROVIDER_SITE_OTHER): Payer: Medicare Other | Admitting: Emergency Medicine

## 2014-02-10 VITALS — BP 128/86 | HR 75 | Temp 97.4°F | Resp 16 | Ht 61.25 in | Wt 132.0 lb

## 2014-02-10 DIAGNOSIS — I2510 Atherosclerotic heart disease of native coronary artery without angina pectoris: Secondary | ICD-10-CM | POA: Diagnosis not present

## 2014-02-10 DIAGNOSIS — R1011 Right upper quadrant pain: Secondary | ICD-10-CM | POA: Diagnosis not present

## 2014-02-10 MED ORDER — SUCRALFATE 1 GM/10ML PO SUSP: 1.0000 g | Freq: Three times a day (TID) | ORAL | Status: DC

## 2014-02-10 NOTE — Telephone Encounter (Signed)
Pt was in today 6/21 and prescribed sucralfate (CARAFATE) 1 GM/10ML suspension by Dr. Everlene Farrier ; pt said it was $300 at the pharmacy, and she can not afford this. She would like to know if she has any other options?

## 2014-02-10 NOTE — Progress Notes (Signed)
Subjective:  This chart was scribed for Alicia Queen, MD by Mercy Moore, Medial Scribe. This patient was seen in room 2 and the patient's care was started at 10:06 AM.    Patient ID: [name], female    DOB: February 29, 1946, 68 y.o.   MRN: [id number]  HPI HPI Comments: [name] is a 68 y.o. female who presents to the [facility] for a follow up appointment. Patient shares that treatment with Protonix and Nexium have not provided relief for her acid reflux. Patient reports exacerbation when consuming greasy or fatty foods; states that she avoids eating such foods. Patient denies having food trapped in her esophagus when swallowing.  Patient states that at night she drinks ice cold water to relieve her pain.   Cardiologist: Dr. Stanford Breed    Patient Active Problem List   Diagnosis Date Noted  . Primary localized osteoarthrosis, lower leg 08/10/2013  . Right knee pain 08/02/2013  . Acute upper respiratory infections of unspecified site 08/02/2013  . Dyspepsia 11/16/2012  . Knee pain, right 02/18/2012  . PAD (peripheral artery disease) 12/03/2011  . PVD (peripheral vascular disease) 10/31/2011  . RSD (reflex sympathetic dystrophy) 10/31/2011  . Vitamin d deficiency   . Preop exam for internal medicine 10/28/2011  . Anxiety 03/11/2011  . COPD (chronic obstructive pulmonary disease) 02/02/2011  . Anemia 02/02/2011  . History of rheumatic fever 02/02/2011  . Insomnia 02/02/2011  . CAD 09/08/2010  . CONSTIPATION 02/19/2010  . TOBACCO ABUSE 07/21/2009  . MURMUR 06/17/2009  . [place] DISEASE, CERVICAL 03/20/2009  . Pain in Soft Tissues of Limb 05/23/2008  . HYPOTHYROIDISM 09/07/2007  . HYPERLIPIDEMIA 09/07/2007  . HYPERTENSION 09/07/2007  . ALLERGIC RHINITIS 09/07/2007  . GERD 09/07/2007  . OSTEOARTHRITIS 09/07/2007  . OSTEOPENIA 09/07/2007  . COLONIC POLYPS, HX OF 09/07/2007   Past Medical History  Diagnosis Date  . Coronary artery disease   .  Hyperlipidemia   . History of rheumatic fever   . Anemia   . OSTEOPENIA   . Insomnia   . COLONIC POLYPS, HX OF   . Hypothyroidism   . Hypertension   . Gastroesophageal reflux disease   . Osteoarthritis   . DISC DISEASE, CERVICAL   . Anxiety   . PVD (peripheral vascular disease) 10/31/2011  . Vitamin D deficiency   . RSD (reflex sympathetic dystrophy) 10/31/2011  . PAD (peripheral artery disease) 12/03/2011  . Cataract   . COPD (chronic obstructive pulmonary disease)     no per pt  . Heart murmur     as a child   Past Surgical History  Procedure Laterality Date  . Coronary artery bypass graft  10/2009    LIMA to the LAD, saphenous vein graft to the acute marginal, saphenous vein graft to the diagonal and obtuse marginal.  . Abdominal hysterectomy  1977  . Thyroidectomy    . Tonsillectomy    . Carpal tunnel release    . Cataract extraction Left    No Known Allergies Prior to Admission medications   Medication Sig Start Date End Date Taking? Authorizing Provider  amLODipine (NORVASC) 10 MG tablet TAKE ONE TABLET BY MOUTH EVERY DAY 04/20/13   Biagio Borg, MD  aspirin 325 MG tablet Take 325 mg by mouth daily.      Historical Provider, MD  Aspirin-Salicylamide-Caffeine (BC HEADACHE PO) Take by mouth as needed.    Historical Provider, MD  bifidobacterium infantis (ALIGN) capsule Take 1 capsule by mouth daily.  12/05/12   Amy S Esterwood, PA-C  Calcium Carb-Cholecalciferol (CALCIUM 1000 + D PO) Take by mouth daily.      Historical Provider, MD  fexofenadine (ALLEGRA) 180 MG tablet Take 1 tablet (180 mg total) by mouth daily. 05/11/13   Biagio Borg, MD  fluticasone (FLONASE) 50 MCG/ACT nasal spray Place 2 sprays into the nose daily. 05/11/13   Biagio Borg, MD  gabapentin (NEURONTIN) 100 MG capsule Start with 2 capsules at night for the first 3 days and then 1-2 capsules in the morning and 2 capsules at night. 01/19/14   Darlyne Russian, MD  hydrochlorothiazide (HYDRODIURIL) 25 MG tablet Take  25 mg by mouth as needed. 10/28/11   Biagio Borg, MD  levothyroxine (SYNTHROID, LEVOTHROID) 112 MCG tablet Take 112 mcg by mouth daily before breakfast.    Historical Provider, MD  meloxicam (MOBIC) 15 MG tablet Take 1 tablet (15 mg total) by mouth daily. 11/29/13   Ellison Carwin, MD  metoprolol tartrate (LOPRESSOR) 25 MG tablet Take 25 mg by mouth 2 (two) times daily. 05/11/13   Biagio Borg, MD  Multiple Vitamin (MULTIVITAMIN) capsule Take 1 capsule by mouth daily.      Historical Provider, MD  pantoprazole (PROTONIX) 40 MG tablet Take one po BID x 3 weeks then one po daily 12/05/12   Amy S Esterwood, PA-C  potassium chloride SA (K-DUR,KLOR-CON) 20 MEQ tablet Take 1 tablet as needed    Historical Provider, MD  PRENATAL VITAMINS PO Take 1 capsule by mouth daily.    Historical Provider, MD  ramipril (ALTACE) 10 MG capsule Take 1 capsule (10 mg total) by mouth daily. 05/11/13   Biagio Borg, MD  Vitamin D, Ergocalciferol, (DRISDOL) 50000 UNITS CAPS Take 50,000 Units by mouth every 7 (seven) days.    Historical Provider, MD   History   Social History  . Marital Status: Single    Spouse Name: N/A    Number of Children: 2  . Years of Education: N/A   Occupational History  . [employer]    Social History Main Topics  . Smoking status: Former Smoker -- 0.80 packs/day for 45 years    Types: Cigarettes    Quit date: 11/05/2010  . Smokeless tobacco: Never Used  . Alcohol Use: No  . Drug Use: No  . Sexual Activity: Not Currently   Other Topics Concern  . Not on file   Social History Narrative  . No narrative on file       Review of Systems  Constitutional: Negative for fever.  HENT: Positive for sore throat.   Gastrointestinal: Positive for abdominal pain.       Objective:   Physical Exam  CONSTITUTIONAL: Well developed/well nourished HEAD: Normocephalic/atraumatic EYES: EOMI/PERRL ENMT: Mucous membranes moist NECK: supple no meningeal signs SPINE: entire spine nontender CV:  S1/S2 noted, no murmurs/rubs/gallops noted LUNGS: Lungs are clear to auscultation bilaterally, no apparent distress ABDOMEN: soft, nontender, no rebound or guarding; mild right upper abdominal tenderness GU: no cva tenderness NEURO: Pt is awake/alert, moves all extremitiesx4 EXTREMITIES: pulses normal, full ROM SKIN: warm, color normal PSYCH: no abnormalities of mood noted  Filed Vitals:   02/10/14 0941  BP: 128/86  Pulse: 75  Temp: 97.4 F (36.3 C)  Resp: 16        Assessment & Plan:  Patient has mild right upper abdominal tenderness. She has an abnormal area on PET scan. She does have reflux symptoms. She will continue on Nexium. I  added Carafate to take. [facility]. Ultrasound ordered of the gallbladder

## 2014-02-11 NOTE — Telephone Encounter (Signed)
Tell her to add Zantac 150 mg to take twice a day along with her protonix

## 2014-02-11 NOTE — Telephone Encounter (Signed)
Spoke to patient and advised her to get zantac 150 mg and take twice daily.  She was very grateful.

## 2014-02-13 ENCOUNTER — Encounter: Payer: Medicare Other | Admitting: Surgery

## 2014-03-01 ENCOUNTER — Ambulatory Visit
Admission: RE | Admit: 2014-03-01 | Discharge: 2014-03-01 | Disposition: A | Discharge status: Home / Self Care | Payer: Medicare Other | Source: Ambulatory Visit | Attending: Emergency Medicine | Admitting: Emergency Medicine

## 2014-03-01 DIAGNOSIS — R1011 Right upper quadrant pain: Secondary | ICD-10-CM

## 2014-03-01 DIAGNOSIS — K7689 Other specified diseases of liver: Secondary | ICD-10-CM | POA: Diagnosis not present

## 2014-03-05 ENCOUNTER — Other Ambulatory Visit (HOSPITAL_COMMUNITY): Payer: Self-pay | Admitting: Orthopedic Surgery

## 2014-03-05 DIAGNOSIS — M1711 Unilateral primary osteoarthritis, right knee: Secondary | ICD-10-CM

## 2014-03-05 DIAGNOSIS — M1710 Unilateral primary osteoarthritis, unspecified knee: Secondary | ICD-10-CM | POA: Diagnosis not present

## 2014-03-06 ENCOUNTER — Ambulatory Visit (HOSPITAL_COMMUNITY)
Admission: RE | Admit: 2014-03-06 | Discharge: 2014-03-06 | Disposition: A | Discharge status: Home / Self Care | Payer: Medicare Other | Source: Ambulatory Visit | Attending: Internal Medicine | Admitting: Internal Medicine

## 2014-03-06 DIAGNOSIS — M1710 Unilateral primary osteoarthritis, unspecified knee: Secondary | ICD-10-CM

## 2014-03-06 DIAGNOSIS — M1711 Unilateral primary osteoarthritis, right knee: Secondary | ICD-10-CM

## 2014-03-06 NOTE — Progress Notes (Signed)
Lower Extremity Arterial Duplex Completed. °Brianna L Mazza,RVT °

## 2014-03-08 ENCOUNTER — Ambulatory Visit (INDEPENDENT_AMBULATORY_CARE_PROVIDER_SITE_OTHER): Payer: Medicare Other | Admitting: Cardiovascular Disease

## 2014-03-08 ENCOUNTER — Encounter: Payer: Self-pay | Admitting: Cardiovascular Disease

## 2014-03-08 VITALS — BP 152/75 | HR 75 | Ht 63.0 in | Wt 135.0 lb

## 2014-03-08 DIAGNOSIS — Z79899 Other long term (current) drug therapy: Secondary | ICD-10-CM | POA: Diagnosis not present

## 2014-03-08 DIAGNOSIS — E039 Hypothyroidism, unspecified: Secondary | ICD-10-CM | POA: Diagnosis not present

## 2014-03-08 DIAGNOSIS — Z87891 Personal history of nicotine dependence: Secondary | ICD-10-CM

## 2014-03-08 DIAGNOSIS — I2510 Atherosclerotic heart disease of native coronary artery without angina pectoris: Secondary | ICD-10-CM

## 2014-03-08 DIAGNOSIS — D689 Coagulation defect, unspecified: Secondary | ICD-10-CM | POA: Diagnosis not present

## 2014-03-08 DIAGNOSIS — I739 Peripheral vascular disease, unspecified: Secondary | ICD-10-CM

## 2014-03-08 NOTE — Assessment & Plan Note (Addendum)
The patient was referred to me by Dr. Gladstone Lighter because of right lower extreme claudication. She has a history of coronary artery disease status post bypass grafting in 2012. She has normal LV function by 2-D echo. She complains of lifestyle limiting claudication on the right with recent Dopplers performed in office 03/06/14 right ABI 0.43 with occluded distal SFA and popliteal. Her peroneals are all within tibial vessel. The left ABI was 0.73 with a high-frequency signal in the mid left SFA. Based on this, I'm going to arrange for her to undergo angiography and potential intervention for lifestyle limiting claudication.

## 2014-03-08 NOTE — Patient Instructions (Signed)
Dr. Gwenlyn Found has ordered a peripheral angiogram to be done at [facility].  This procedure is going to look at the bloodflow in your lower extremities.  If Dr. Gwenlyn Found is able to open up the arteries, you will have to spend one night in the hospital.  If he is not able to open the arteries, you will be able to go home that same day.    After the procedure, you will not be allowed to drive for 3 days or push, pull, or lift anything greater than 10 lbs for one week.    You will be required to have the following tests prior to the procedure:  1. Blood work-the blood work can be done no more than 7 days prior to the procedure.  It can be done at any [facility] lab.  There is one downstairs on the first floor of this building and one in the [place] ([address])  2. Chest Xray-the chest xray order has already been placed at the [place].     *REPS [facility] Left groin

## 2014-03-08 NOTE — Progress Notes (Signed)
03/08/2014 [name]   07-08-1946  [id number]  Primary Physician Alicia Cower, MD Primary Cardiologist: Alicia Harp MD [name]   HPI: [name] is a 68 year old African American female patient of [name] referred for peripheral vascular evaluation.she was seen by [name] 3, orthopedic surgeon, who referred her here. Her past history is remarkable for ischemic heart disease status post coronary bypass grafting in March 2012 of the LIMA to LAD, vein to the diagonal branch, obtuse marginal branch and a vein to acute marginal branch. She has normal LV function by 2-D echo. Further problems include hypertension, and hyperlipidemia. She complains of right lobe; occasional and. She has seen [name] in the past the thought that her pain was arthritic in nature. Lower extremity arterial Doppler studies performed 07/07/14 revealed a right ABI 0.43 with an occluded distal right SFA and popliteal artery, left ABI 0.73 with a high-frequency signal in the mid left SFA.   Current Outpatient Prescriptions  Medication Sig Dispense Refill  . amLODipine (NORVASC) 10 MG tablet TAKE ONE TABLET BY MOUTH EVERY DAY  90 tablet  2  . aspirin 325 MG tablet Take 325 mg by mouth daily.        . Aspirin-Salicylamide-Caffeine (BC HEADACHE PO) Take by mouth as needed.      . bifidobacterium infantis (ALIGN) capsule Take 1 capsule by mouth daily.  28 capsule  0  . Calcium Carb-Cholecalciferol (CALCIUM 1000 + D PO) Take by mouth daily.        . fexofenadine (ALLEGRA) 180 MG tablet Take 1 tablet (180 mg total) by mouth daily.  30 tablet  2  . fluticasone (FLONASE) 50 MCG/ACT nasal spray Place 2 sprays into the nose daily.  16 g  2  . gabapentin (NEURONTIN) 100 MG capsule Start with 2 capsules at night for the first 3 days and then 1-2 capsules in the morning and 2 capsules at night.  120 capsule  3  . hydrochlorothiazide (HYDRODIURIL) 25 MG tablet Take 25 mg by mouth as needed.      Marland Kitchen  levothyroxine (SYNTHROID, LEVOTHROID) 112 MCG tablet Take 112 mcg by mouth daily before breakfast.      . meloxicam (MOBIC) 15 MG tablet Take 1 tablet (15 mg total) by mouth daily.  30 tablet  1  . metoprolol tartrate (LOPRESSOR) 25 MG tablet Take 25 mg by mouth 2 (two) times daily.      . Multiple Vitamin (MULTIVITAMIN) capsule Take 1 capsule by mouth daily.        . pantoprazole (PROTONIX) 40 MG tablet Take one po BID x 3 weeks then one po daily  60 tablet  3  . potassium chloride SA (K-DUR,KLOR-CON) 20 MEQ tablet Take 1 tablet as needed      . PRENATAL VITAMINS PO Take 1 capsule by mouth daily.      . ramipril (ALTACE) 10 MG capsule Take 1 capsule (10 mg total) by mouth daily.  90 capsule  3  . sucralfate (CARAFATE) 1 GM/10ML suspension Take 10 mLs (1 g total) by mouth 4 (four) times daily -  with meals and at bedtime.  420 mL  0  . Vitamin D, Ergocalciferol, (DRISDOL) 50000 UNITS CAPS Take 50,000 Units by mouth every 7 (seven) days.       No current facility-administered medications for this visit.    No Known Allergies  History   Social History  . Marital Status: Single    Spouse Name:  N/A    Number of Children: 2  . Years of Education: N/A   Occupational History  . [employer]    Social History Main Topics  . Smoking status: Former Smoker -- 0.80 packs/day for 45 years    Types: Cigarettes    Quit date: 11/05/2010  . Smokeless tobacco: Never Used  . Alcohol Use: No  . Drug Use: No  . Sexual Activity: Not Currently   Other Topics Concern  . Not on file   Social History Narrative  . No narrative on file     Review of Systems: General: negative for chills, fever, night sweats or weight changes.  Cardiovascular: negative for chest pain, dyspnea on exertion, edema, orthopnea, palpitations, paroxysmal nocturnal dyspnea or shortness of breath Dermatological: negative for rash Respiratory: negative for cough or wheezing Urologic: negative for hematuria Abdominal: negative  for nausea, vomiting, diarrhea, bright red blood per rectum, melena, or hematemesis Neurologic: negative for visual changes, syncope, or dizziness All other systems reviewed and are otherwise negative except as noted above.    Blood pressure 152/75, pulse 75, height 5\' 3"  (1.6 m), weight 135 lb (61.236 kg).  General appearance: alert and no distress Neck: no adenopathy, no carotid bruit, no JVD, supple, symmetrical, trachea midline and thyroid not enlarged, symmetric, no tenderness/mass/nodules Lungs: clear to auscultation bilaterally Heart: regular rate and rhythm, S1, S2 normal, no murmur, click, rub or gallop Extremities: extremities normal, atraumatic, no cyanosis or edema and absent pedal pulses  EKG not performed today  ASSESSMENT AND PLAN:   PAD (peripheral artery disease) The patient was referred to me by [name] because of right lower extreme claudication. She has a history of coronary artery disease status post bypass grafting in 2012. She has normal LV function by 2-D echo. She complains of lifestyle limiting claudication on the right with recent Dopplers performed in office 03/06/14 right ABI 0.43 with occluded distal SFA and popliteal. Her peroneals are all within tibial vessel. The left ABI was 0.73 with a high-frequency signal in the mid left SFA. Based on this, I'm going to arrange for her to undergo angiography and potential intervention for lifestyle limiting claudication.      Alicia Harp MD FACP,FACC,FAHA, [facility] 03/08/2014 5:52 PM

## 2014-03-11 ENCOUNTER — Encounter: Payer: Self-pay | Admitting: Cardiovascular Disease

## 2014-03-11 DIAGNOSIS — I2510 Atherosclerotic heart disease of native coronary artery without angina pectoris: Secondary | ICD-10-CM | POA: Diagnosis not present

## 2014-03-11 DIAGNOSIS — E785 Hyperlipidemia, unspecified: Secondary | ICD-10-CM | POA: Diagnosis not present

## 2014-03-11 DIAGNOSIS — J449 Chronic obstructive pulmonary disease, unspecified: Secondary | ICD-10-CM | POA: Diagnosis not present

## 2014-03-11 DIAGNOSIS — E559 Vitamin D deficiency, unspecified: Secondary | ICD-10-CM | POA: Diagnosis not present

## 2014-03-11 DIAGNOSIS — E039 Hypothyroidism, unspecified: Secondary | ICD-10-CM | POA: Diagnosis not present

## 2014-03-11 DIAGNOSIS — G9050 Complex regional pain syndrome I, unspecified: Secondary | ICD-10-CM | POA: Diagnosis not present

## 2014-03-11 DIAGNOSIS — M949 Disorder of cartilage, unspecified: Secondary | ICD-10-CM | POA: Diagnosis not present

## 2014-03-11 DIAGNOSIS — M899 Disorder of bone, unspecified: Secondary | ICD-10-CM | POA: Diagnosis not present

## 2014-03-11 DIAGNOSIS — R222 Localized swelling, mass and lump, trunk: Secondary | ICD-10-CM | POA: Diagnosis not present

## 2014-03-12 DIAGNOSIS — E039 Hypothyroidism, unspecified: Secondary | ICD-10-CM | POA: Diagnosis not present

## 2014-03-12 DIAGNOSIS — D689 Coagulation defect, unspecified: Secondary | ICD-10-CM | POA: Diagnosis not present

## 2014-03-12 DIAGNOSIS — Z79899 Other long term (current) drug therapy: Secondary | ICD-10-CM | POA: Diagnosis not present

## 2014-03-12 LAB — APTT: aPTT: 29 seconds (ref 24–37)

## 2014-03-12 LAB — BASIC METABOLIC PANEL
BUN: 11 mg/dL (ref 6–23)
CO2: 27 mEq/L (ref 19–32)
Calcium: 8.8 mg/dL (ref 8.4–10.5)
Chloride: 108 mEq/L (ref 96–112)
Creat: 0.68 mg/dL (ref 0.50–1.10)
Glucose, Bld: 107 mg/dL — ABNORMAL HIGH (ref 70–99)
Potassium: 3.8 mEq/L (ref 3.5–5.3)
Sodium: 147 mEq/L — ABNORMAL HIGH (ref 135–145)

## 2014-03-12 LAB — CBC
HCT: 35.2 % — ABNORMAL LOW (ref 36.0–46.0)
Hemoglobin: 11.8 g/dL — ABNORMAL LOW (ref 12.0–15.0)
MCH: 28.2 pg (ref 26.0–34.0)
MCHC: 33.5 g/dL (ref 30.0–36.0)
MCV: 84 fL (ref 78.0–100.0)
Platelets: 315 10*3/uL (ref 150–400)
RBC: 4.19 MIL/uL (ref 3.87–5.11)
RDW: 15.3 % (ref 11.5–15.5)
WBC: 5.3 10*3/uL (ref 4.0–10.5)

## 2014-03-12 LAB — TSH: TSH: 0.015 u[IU]/mL — ABNORMAL LOW (ref 0.350–4.500)

## 2014-03-12 LAB — PROTIME-INR
INR: 0.98 (ref <1.50)
Prothrombin Time: 13 seconds (ref 11.6–15.2)

## 2014-03-13 ENCOUNTER — Encounter: Payer: Self-pay | Admitting: Cardiovascular Disease

## 2014-03-14 ENCOUNTER — Encounter (HOSPITAL_COMMUNITY): Payer: Self-pay | Admitting: Pharmacy Technician

## 2014-03-19 ENCOUNTER — Ambulatory Visit: Payer: Medicare Other | Admitting: Cardiovascular Disease

## 2014-03-19 ENCOUNTER — Ambulatory Visit
Admission: RE | Admit: 2014-03-19 | Discharge: 2014-03-19 | Disposition: A | Discharge status: Home / Self Care | Payer: Medicare Other | Source: Ambulatory Visit | Attending: Cardiovascular Disease | Admitting: Cardiovascular Disease

## 2014-03-19 DIAGNOSIS — R05 Cough: Secondary | ICD-10-CM | POA: Diagnosis not present

## 2014-03-19 DIAGNOSIS — Z01818 Encounter for other preprocedural examination: Secondary | ICD-10-CM | POA: Diagnosis not present

## 2014-03-19 DIAGNOSIS — R059 Cough, unspecified: Secondary | ICD-10-CM | POA: Diagnosis not present

## 2014-03-19 DIAGNOSIS — Z87891 Personal history of nicotine dependence: Secondary | ICD-10-CM

## 2014-03-20 ENCOUNTER — Telehealth: Payer: Self-pay | Admitting: *Deleted

## 2014-03-20 NOTE — Telephone Encounter (Signed)
Message copied by Vennie Homans on Wed Mar 20, 2014 12:34 PM ------      Message from: Lorretta Harp      Created: Tue Mar 19, 2014  1:25 PM       NAD ------

## 2014-03-20 NOTE — Telephone Encounter (Signed)
Left message to inform patient about her chest xray look good for surgery and if there be any question she can feel free to call us back.

## 2014-03-21 ENCOUNTER — Encounter (HOSPITAL_COMMUNITY): Payer: Self-pay | Admitting: General Practice

## 2014-03-21 ENCOUNTER — Ambulatory Visit (HOSPITAL_COMMUNITY)
Admission: RE | Admit: 2014-03-21 | Discharge: 2014-03-22 | Disposition: A | Discharge status: Home / Self Care | Payer: Medicare Other | Source: Ambulatory Visit | Attending: Cardiovascular Disease | Admitting: Cardiovascular Disease

## 2014-03-21 ENCOUNTER — Encounter (HOSPITAL_COMMUNITY)
Admission: RE | Disposition: A | Discharge status: Home / Self Care | Payer: Self-pay | Source: Ambulatory Visit | Attending: Cardiovascular Disease

## 2014-03-21 DIAGNOSIS — I739 Peripheral vascular disease, unspecified: Secondary | ICD-10-CM | POA: Diagnosis present

## 2014-03-21 DIAGNOSIS — J449 Chronic obstructive pulmonary disease, unspecified: Secondary | ICD-10-CM | POA: Diagnosis present

## 2014-03-21 DIAGNOSIS — Z87891 Personal history of nicotine dependence: Secondary | ICD-10-CM

## 2014-03-21 DIAGNOSIS — D689 Coagulation defect, unspecified: Secondary | ICD-10-CM

## 2014-03-21 DIAGNOSIS — Z7982 Long term (current) use of aspirin: Secondary | ICD-10-CM | POA: Diagnosis not present

## 2014-03-21 DIAGNOSIS — E039 Hypothyroidism, unspecified: Secondary | ICD-10-CM | POA: Diagnosis present

## 2014-03-21 DIAGNOSIS — J410 Simple chronic bronchitis: Secondary | ICD-10-CM

## 2014-03-21 DIAGNOSIS — I70219 Atherosclerosis of native arteries of extremities with intermittent claudication, unspecified extremity: Secondary | ICD-10-CM | POA: Insufficient documentation

## 2014-03-21 DIAGNOSIS — Z951 Presence of aortocoronary bypass graft: Secondary | ICD-10-CM | POA: Diagnosis not present

## 2014-03-21 DIAGNOSIS — I7092 Chronic total occlusion of artery of the extremities: Secondary | ICD-10-CM | POA: Diagnosis not present

## 2014-03-21 DIAGNOSIS — I2510 Atherosclerotic heart disease of native coronary artery without angina pectoris: Secondary | ICD-10-CM | POA: Diagnosis present

## 2014-03-21 DIAGNOSIS — I10 Essential (primary) hypertension: Secondary | ICD-10-CM | POA: Diagnosis not present

## 2014-03-21 DIAGNOSIS — K219 Gastro-esophageal reflux disease without esophagitis: Secondary | ICD-10-CM | POA: Diagnosis not present

## 2014-03-21 DIAGNOSIS — E785 Hyperlipidemia, unspecified: Secondary | ICD-10-CM | POA: Diagnosis present

## 2014-03-21 DIAGNOSIS — J4489 Other specified chronic obstructive pulmonary disease: Secondary | ICD-10-CM | POA: Diagnosis not present

## 2014-03-21 DIAGNOSIS — Z79899 Other long term (current) drug therapy: Secondary | ICD-10-CM

## 2014-03-21 DIAGNOSIS — F17200 Nicotine dependence, unspecified, uncomplicated: Secondary | ICD-10-CM | POA: Diagnosis present

## 2014-03-21 HISTORY — DX: Unspecified osteoarthritis, unspecified site: M19.90

## 2014-03-21 HISTORY — PX: BALLOON ANGIOPLASTY, ARTERY: SHX564

## 2014-03-21 HISTORY — PX: LOWER EXTREMITY ANGIOGRAM: SHX5508

## 2014-03-21 LAB — POCT ACTIVATED CLOTTING TIME
Activated Clotting Time: 219 seconds
Activated Clotting Time: 247 seconds
Activated Clotting Time: 219 seconds
Activated Clotting Time: 168 seconds

## 2014-03-21 SURGERY — ANGIOGRAM, LOWER EXTREMITY
Anesthesia: LOCAL

## 2014-03-21 MED ORDER — ONDANSETRON HCL 4 MG/2ML IJ SOLN
4.0000 mg | Freq: Four times a day (QID) | INTRAMUSCULAR | Status: DC | PRN
  Administered 2014-03-21: 4 mg via INTRAVENOUS
  Filled 2014-03-21: qty 2

## 2014-03-21 MED ORDER — HYDROCHLOROTHIAZIDE 25 MG PO TABS
25.0000 mg | ORAL_TABLET | Freq: Every day | ORAL | Status: DC
  Administered 2014-03-21 – 2014-03-22 (×2): 25 mg via ORAL
  Filled 2014-03-21 (×3): qty 1

## 2014-03-21 MED ORDER — CLOPIDOGREL BISULFATE 75 MG PO TABS
75.0000 mg | ORAL_TABLET | Freq: Every day | ORAL | Status: DC
  Administered 2014-03-22: 75 mg via ORAL
  Filled 2014-03-21: qty 1

## 2014-03-21 MED ORDER — METOPROLOL TARTRATE 25 MG PO TABS
25.0000 mg | ORAL_TABLET | Freq: Two times a day (BID) | ORAL | Status: DC
  Administered 2014-03-21 – 2014-03-22 (×2): 25 mg via ORAL
  Filled 2014-03-21 (×3): qty 1

## 2014-03-21 MED ORDER — FENTANYL CITRATE 0.05 MG/ML IJ SOLN
INTRAMUSCULAR | Status: AC
  Filled 2014-03-21: qty 2

## 2014-03-21 MED ORDER — SODIUM CHLORIDE 0.9 % IV SOLN
INTRAVENOUS | Status: DC
  Administered 2014-03-21: 10:00:00 via INTRAVENOUS

## 2014-03-21 MED ORDER — ASPIRIN 325 MG PO TABS: 325.0000 mg | ORAL_TABLET | Freq: Every day | ORAL | Status: DC

## 2014-03-21 MED ORDER — LIDOCAINE HCL (PF) 1 % IJ SOLN
INTRAMUSCULAR | Status: AC
  Filled 2014-03-21: qty 30

## 2014-03-21 MED ORDER — HYDROCODONE-ACETAMINOPHEN 5-325 MG PO TABS: 1.0000 | ORAL_TABLET | ORAL | Status: DC | PRN

## 2014-03-21 MED ORDER — CLOPIDOGREL BISULFATE 300 MG PO TABS
ORAL_TABLET | ORAL | Status: AC
  Administered 2014-03-22: 75 mg via ORAL
  Filled 2014-03-21: qty 1

## 2014-03-21 MED ORDER — SODIUM CHLORIDE 0.9 % IJ SOLN: 3.0000 mL | INTRAMUSCULAR | Status: DC | PRN

## 2014-03-21 MED ORDER — LEVOTHYROXINE SODIUM 112 MCG PO TABS
112.0000 ug | ORAL_TABLET | Freq: Every day | ORAL | Status: DC
  Administered 2014-03-22: 07:00:00 112 ug via ORAL
  Filled 2014-03-21 (×2): qty 1

## 2014-03-21 MED ORDER — SODIUM CHLORIDE 0.9 % IV SOLN: INTRAVENOUS | Status: AC

## 2014-03-21 MED ORDER — ASPIRIN 81 MG PO CHEW
81.0000 mg | CHEWABLE_TABLET | ORAL | Status: AC
  Administered 2014-03-21: 81 mg via ORAL

## 2014-03-21 MED ORDER — MORPHINE SULFATE 2 MG/ML IJ SOLN
2.0000 mg | INTRAMUSCULAR | Status: DC | PRN
  Administered 2014-03-21: 2 mg via INTRAVENOUS
  Filled 2014-03-21: qty 1

## 2014-03-21 MED ORDER — ASPIRIN 81 MG PO CHEW
CHEWABLE_TABLET | ORAL | Status: AC
  Filled 2014-03-21: qty 1

## 2014-03-21 MED ORDER — ASPIRIN EC 81 MG PO TBEC
81.0000 mg | DELAYED_RELEASE_TABLET | Freq: Every day | ORAL | Status: DC
  Administered 2014-03-22: 81 mg via ORAL
  Filled 2014-03-21: qty 1

## 2014-03-21 MED ORDER — MIDAZOLAM HCL 2 MG/2ML IJ SOLN
INTRAMUSCULAR | Status: AC
  Filled 2014-03-21: qty 2

## 2014-03-21 MED ORDER — AMLODIPINE BESYLATE 10 MG PO TABS
10.0000 mg | ORAL_TABLET | Freq: Every day | ORAL | Status: DC
  Administered 2014-03-21 – 2014-03-22 (×2): 10 mg via ORAL
  Filled 2014-03-21 (×2): qty 1

## 2014-03-21 MED ORDER — HEPARIN SODIUM (PORCINE) 1000 UNIT/ML IJ SOLN
INTRAMUSCULAR | Status: AC
  Filled 2014-03-21: qty 1

## 2014-03-21 MED ORDER — HEPARIN (PORCINE) IN NACL 2-0.9 UNIT/ML-% IJ SOLN
INTRAMUSCULAR | Status: AC
  Filled 2014-03-21: qty 1000

## 2014-03-21 MED ORDER — RAMIPRIL 10 MG PO CAPS
10.0000 mg | ORAL_CAPSULE | Freq: Every day | ORAL | Status: DC
  Administered 2014-03-21 – 2014-03-22 (×2): 10 mg via ORAL
  Filled 2014-03-21 (×2): qty 1

## 2014-03-21 MED ORDER — CLOPIDOGREL BISULFATE 75 MG PO TABS: 75.0000 mg | ORAL_TABLET | Freq: Every day | ORAL | Status: DC

## 2014-03-21 MED ORDER — HYDRALAZINE HCL 20 MG/ML IJ SOLN: 10.0000 mg | INTRAMUSCULAR | Status: DC | PRN

## 2014-03-21 MED ORDER — ACETAMINOPHEN 325 MG PO TABS: 650.0000 mg | ORAL_TABLET | ORAL | Status: DC | PRN

## 2014-03-21 NOTE — Interval H&P Note (Signed)
History and Physical Interval Note:  03/21/2014 12:35 PM  [name]  has presented today for surgery, with the diagnosis of claudication  The various methods of treatment have been discussed with the patient and family. After consideration of risks, benefits and other options for treatment, the patient has consented to  Procedure(s): LOWER EXTREMITY ANGIOGRAM (N/A) as a surgical intervention .  The patient's history has been reviewed, patient examined, no change in status, stable for surgery.  I have reviewed the patient's chart and labs.  Questions were answered to the patient's satisfaction.     Lorretta Harp

## 2014-03-21 NOTE — Progress Notes (Signed)
Site area: left groin  Site Prior to Removal:  Level 1( small hematoma)  Pressure Applied For 20 MINUTES    Minutes Beginning at 1750  Manual:   Yes.    Patient Status During Pull:  AAO X4  Post Pull Groin Site:  Level 0  Post Pull Instructions Given:  Yes.    Post Pull Pulses Present:  Yes.    Dressing Applied:  Yes.    Comments:  C/o feeling hot and nausea during initial sheath pull, sb/p 90's , zofran given , claimed with relief, v/s monitored, b/p 106/68. - 140/70 mmhg

## 2014-03-21 NOTE — CV Procedure (Signed)
[name] is a 68 y.o. female    [id number] LOCATION:  FACILITY: [facility]  PHYSICIAN: Quay Burow, M.D. [birth year]   DATE OF PROCEDURE:  03/21/2014  DATE OF DISCHARGE:     PV Angiogram/Intervention    History obtained from chart review.[name] is a 68 year old African American female patient of Dr. Jacalyn Lefevre referred for peripheral vascular evaluation.she was seen by Dr. Gladstone Lighter , orthopedic surgeon, who referred her here. Her past history is remarkable for ischemic heart disease status post coronary bypass grafting in March 2012 of the LIMA to LAD, vein to the diagonal branch, obtuse marginal branch and a vein to acute marginal branch. She has normal LV function by 2-D echo. Further problems include hypertension, and hyperlipidemia. She complains of right lobe; occasional and. She has seen Dr. Fletcher Anon in the past the thought that her pain was arthritic in nature. Lower extremity arterial Doppler studies performed 07/07/14 revealed a right ABI 0.43 with an occluded distal right SFA and popliteal artery, left ABI 0.73 with a high-frequency signal in the mid left SFA. She presents now for angiography and potential percutaneous intervention for lifestyle limiting claudication.    PROCEDURE DESCRIPTION:   The patient was brought to the second floor [facility] cath lab in the postabsorptive state. She was premedicated with Valium 5 mg by mouth, IV Versed and fentanyl. Her left groinwas prepped and shaved in usual sterile fashion. Xylocaine 1% was used for local anesthesia. A 5 French sheath was inserted into the left common femoral artery using standard Seldinger technique.a 5 French pigtail catheter was placed in the distal abdominal aorta. Abdominal aortography, bilateral iliac angiography with bifemoral runoff was performed using bolus chase digital subtraction settable technique. Omnipaque dye was used for the entirety of the case. Retrograde aortic pressure was monitored during  the case.  HEMODYNAMICS:    AO SYSTOLIC/AO DIASTOLIC: 400/86   Angiographic Data:   1: Abdominal aortogram-the distal dominant order was free of significant disease  2: Left lower extremity-was a 70% stenosis in the distal left common iliac artery. There was an 80% segmental mid left SFA with one vessel runoff via the peroneal  3: Right lower extremity-there was a 50% segmental proximal right SFA, short segment occlusion mid right SFA with 75% segmental stenosis prior to that. There is one-vessel runoff via the peroneal.  IMPRESSION:short segment occlusion mid right SFA with moderate segmental diseased proximal to this and one vessel runoff. We will proceed with directional atherectomy plus minus drug-eluting balloon angioplasty.  Procedure Description:contralateral access obtained with a crossover catheter, 0.35 cm Glidewire, 0.35 cm Rosen wire , 7 French Terumo crossover sheath. The patient received 7000 units of heparin intravenously with an ACT of 219. A total of 260 cc of contrast was administered to the patient. I was able to cross the chronic total occlusion with a Viance CTO catheter along with a 0.14 cm a 300 cm long [place] core wire. I then placed a 6 mm spider distal protection device in the below knee popliteal artery. I used a HAWK 1 directional atherectomy device to perform atherectomy of the chronic total occlusion as well as the segmentally diseased artery proximal to this removing a copious amount of atherosclerotic plaque. I then performed drug eluting balloon angioplasty with a 5 mm x 100 mm long Lutonix DEB. The final angiographic result with reduction of 100% chronic total occlusion to 0% residual without dissection and with excellent flow down the peroneal. I then captured despite her distal protection device  and demonstrated significant plaque captured within the device. Completion angiography was performed. The sheath was then withdrawn across the bifurcation and exchanged  over a 0.35 cm wire for a short 7 French sheath and the patient left the lab in stable condition.  Final Impression: successful HAWK 1 directional atherectomy right SFA chronic total occlusion with drug eluting balloon angioplasty with an excellent angiographic result. The sheath will be removed once the ACT falls below 170 her pressure will be held. The patient will be treated with dual antiplatelet therapy. She'll be discharged home in the morning to get followup lower extremity arterial Doppler studies in our [place] office. She will see a mid-level provider in one to 2 weeks afterwards on the data I am in the office.    Lorretta Harp MD, [facility] 03/21/2014 2:25 PM

## 2014-03-21 NOTE — H&P (View-Only) (Signed)
03/08/2014 [name]   Feb 19, 1946  [id number]  Primary Physician Alicia Cower, MD Primary Cardiologist: Alicia Harp MD [name]   HPI: [name] is a 68 year old African American female patient of [name] referred for peripheral vascular evaluation.she was seen by [name] 3, orthopedic surgeon, who referred her here. Her past history is remarkable for ischemic heart disease status post coronary bypass grafting in March 2012 of the LIMA to LAD, vein to the diagonal branch, obtuse marginal branch and a vein to acute marginal branch. She has normal LV function by 2-D echo. Further problems include hypertension, and hyperlipidemia. She complains of right lobe; occasional and. She has seen [name] in the past the thought that her pain was arthritic in nature. Lower extremity arterial Doppler studies performed 07/07/14 revealed a right ABI 0.43 with an occluded distal right SFA and popliteal artery, left ABI 0.73 with a high-frequency signal in the mid left SFA.   Current Outpatient Prescriptions  Medication Sig Dispense Refill  . amLODipine (NORVASC) 10 MG tablet TAKE ONE TABLET BY MOUTH EVERY DAY  90 tablet  2  . aspirin 325 MG tablet Take 325 mg by mouth daily.        . Aspirin-Salicylamide-Caffeine (BC HEADACHE PO) Take by mouth as needed.      . bifidobacterium infantis (ALIGN) capsule Take 1 capsule by mouth daily.  28 capsule  0  . Calcium Carb-Cholecalciferol (CALCIUM 1000 + D PO) Take by mouth daily.        . fexofenadine (ALLEGRA) 180 MG tablet Take 1 tablet (180 mg total) by mouth daily.  30 tablet  2  . fluticasone (FLONASE) 50 MCG/ACT nasal spray Place 2 sprays into the nose daily.  16 g  2  . gabapentin (NEURONTIN) 100 MG capsule Start with 2 capsules at night for the first 3 days and then 1-2 capsules in the morning and 2 capsules at night.  120 capsule  3  . hydrochlorothiazide (HYDRODIURIL) 25 MG tablet Take 25 mg by mouth as needed.      Marland Kitchen  levothyroxine (SYNTHROID, LEVOTHROID) 112 MCG tablet Take 112 mcg by mouth daily before breakfast.      . meloxicam (MOBIC) 15 MG tablet Take 1 tablet (15 mg total) by mouth daily.  30 tablet  1  . metoprolol tartrate (LOPRESSOR) 25 MG tablet Take 25 mg by mouth 2 (two) times daily.      . Multiple Vitamin (MULTIVITAMIN) capsule Take 1 capsule by mouth daily.        . pantoprazole (PROTONIX) 40 MG tablet Take one po BID x 3 weeks then one po daily  60 tablet  3  . potassium chloride SA (K-DUR,KLOR-CON) 20 MEQ tablet Take 1 tablet as needed      . PRENATAL VITAMINS PO Take 1 capsule by mouth daily.      . ramipril (ALTACE) 10 MG capsule Take 1 capsule (10 mg total) by mouth daily.  90 capsule  3  . sucralfate (CARAFATE) 1 GM/10ML suspension Take 10 mLs (1 g total) by mouth 4 (four) times daily -  with meals and at bedtime.  420 mL  0  . Vitamin D, Ergocalciferol, (DRISDOL) 50000 UNITS CAPS Take 50,000 Units by mouth every 7 (seven) days.       No current facility-administered medications for this visit.    No Known Allergies  History   Social History  . Marital Status: Single    Spouse Name:  N/A    Number of Children: 2  . Years of Education: N/A   Occupational History  . [employer]    Social History Main Topics  . Smoking status: Former Smoker -- 0.80 packs/day for 45 years    Types: Cigarettes    Quit date: 11/05/2010  . Smokeless tobacco: Never Used  . Alcohol Use: No  . Drug Use: No  . Sexual Activity: Not Currently   Other Topics Concern  . Not on file   Social History Narrative  . No narrative on file     Review of Systems: General: negative for chills, fever, night sweats or weight changes.  Cardiovascular: negative for chest pain, dyspnea on exertion, edema, orthopnea, palpitations, paroxysmal nocturnal dyspnea or shortness of breath Dermatological: negative for rash Respiratory: negative for cough or wheezing Urologic: negative for hematuria Abdominal: negative  for nausea, vomiting, diarrhea, bright red blood per rectum, melena, or hematemesis Neurologic: negative for visual changes, syncope, or dizziness All other systems reviewed and are otherwise negative except as noted above.    Blood pressure 152/75, pulse 75, height 5\' 3"  (1.6 m), weight 135 lb (61.236 kg).  General appearance: alert and no distress Neck: no adenopathy, no carotid bruit, no JVD, supple, symmetrical, trachea midline and thyroid not enlarged, symmetric, no tenderness/mass/nodules Lungs: clear to auscultation bilaterally Heart: regular rate and rhythm, S1, S2 normal, no murmur, click, rub or gallop Extremities: extremities normal, atraumatic, no cyanosis or edema and absent pedal pulses  EKG not performed today  ASSESSMENT AND PLAN:   PAD (peripheral artery disease) The patient was referred to me by [name] because of right lower extreme claudication. She has a history of coronary artery disease status post bypass grafting in 2012. She has normal LV function by 2-D echo. She complains of lifestyle limiting claudication on the right with recent Dopplers performed in office 03/06/14 right ABI 0.43 with occluded distal SFA and popliteal. Her peroneals are all within tibial vessel. The left ABI was 0.73 with a high-frequency signal in the mid left SFA. Based on this, I'm going to arrange for her to undergo angiography and potential intervention for lifestyle limiting claudication.      Alicia Harp MD FACP,FACC,FAHA, [facility] 03/08/2014 5:52 PM

## 2014-03-22 ENCOUNTER — Other Ambulatory Visit: Payer: Self-pay | Admitting: Physician Assistant

## 2014-03-22 DIAGNOSIS — I739 Peripheral vascular disease, unspecified: Secondary | ICD-10-CM

## 2014-03-22 DIAGNOSIS — J410 Simple chronic bronchitis: Secondary | ICD-10-CM | POA: Diagnosis not present

## 2014-03-22 DIAGNOSIS — I2510 Atherosclerotic heart disease of native coronary artery without angina pectoris: Secondary | ICD-10-CM

## 2014-03-22 DIAGNOSIS — I70219 Atherosclerosis of native arteries of extremities with intermittent claudication, unspecified extremity: Secondary | ICD-10-CM | POA: Diagnosis not present

## 2014-03-22 LAB — BASIC METABOLIC PANEL
Anion gap: 12 (ref 5–15)
BUN: 9 mg/dL (ref 6–23)
CO2: 24 mEq/L (ref 19–32)
Calcium: 8.9 mg/dL (ref 8.4–10.5)
Chloride: 104 mEq/L (ref 96–112)
Creatinine, Ser: 0.68 mg/dL (ref 0.50–1.10)
GFR calc Af Amer: >90 mL/min (ref >90)
GFR calc non Af Amer: 87 mL/min — ABNORMAL LOW (ref >90)
Glucose, Bld: 111 mg/dL — ABNORMAL HIGH (ref 70–99)
Potassium: 4.1 mEq/L (ref 3.7–5.3)
Sodium: 140 mEq/L (ref 137–147)

## 2014-03-22 LAB — CBC
HCT: 34.9 % — ABNORMAL LOW (ref 36.0–46.0)
Hemoglobin: 11.1 g/dL — ABNORMAL LOW (ref 12.0–15.0)
MCH: 28.2 pg (ref 26.0–34.0)
MCHC: 31.8 g/dL (ref 30.0–36.0)
MCV: 88.8 fL (ref 78.0–100.0)
Platelets: 312 10*3/uL (ref 150–400)
RBC: 3.93 MIL/uL (ref 3.87–5.11)
RDW: 15.3 % (ref 11.5–15.5)
WBC: 7.2 10*3/uL (ref 4.0–10.5)

## 2014-03-22 MED ORDER — CLOPIDOGREL BISULFATE 75 MG PO TABS: 75.0000 mg | ORAL_TABLET | Freq: Every day | ORAL | Status: DC

## 2014-03-22 MED ORDER — ASPIRIN 81 MG PO TBEC: 81.0000 mg | DELAYED_RELEASE_TABLET | Freq: Every day | ORAL | Status: DC

## 2014-03-22 NOTE — Discharge Instructions (Signed)
Arteriogram  An arteriogram (or angiogram) is an X-ray test of your blood vessels. You will be awake during the test. This test looks for:  Blocked blood vessels.  Blood vessels that are not normal. BEFORE THE TEST Schedule an appointment for your arteriogram. Let the person know if:  You have diabetes.  You are allergic to any food or medicine.  You are allergic to X-ray dye (contrast).  You have asthma or had it when you were a child.  You are pregnant or you might be pregnant.  You are taking aspirin or blood thinners. The night before the test:   Do not  eat or drink anything after midnight. On the morning of your test:   Do not drive. Have someone bring you and take you home.  Bring all the medicines you need to take with you. If you have diabetes, do not take your insulin before the test, but bring it with you. THE TEST  You will lie on the X-ray table.  An IV tube is started in your arm.  Your blood pressure and heartbeat are checked during the test.  The upper part of your leg (groin) is shaved and washed with special soap.  You are then covered with a germ free (sterile) sheet. Keep your arms at your side under the sheet at all times so that germs do not get on the sheet.  The skin is numbed where the thin tube (catheter) is put into your upper leg. The thin tube is moved up into the blood vessels that your doctor wants to see.  Dye is put in through the tube. You may have a feeling of heat as the dye moves into your body.  X-ray pictures of your blood vessels are taken. Do not move while the dye goes in and pictures are taken. AFTER THE TEST  The thin tube will be taken out of your upper leg.  The nurse will check:  Your blood pressure.  The place where the thin tube was taken out to make sure it is not bleeding.  The blood flow to your feet.  Lie flat in bed. Keep your leg straight for 6 hours after the thin tube is taken out.  Ask your doctor or  nurse if you should take your regular medications that you brought. Finding out the results of your test Ask when your test results will be ready. Make sure you get your test results. Document Released: 11/05/2008 Document Revised: 08/14/2013 Document Reviewed: 11/05/2008 [facility] Patient Information 2015 [place], [place]. This information is not intended to replace advice given to you by your health care provider. Make sure you discuss any questions you have with your health care provider.  Angiogram An angiogram, also called angiography, is a procedure used to look at the blood vessels that carry blood to different parts of your body (arteries). In this procedure, dye is injected through a long, thin tube (catheter) into an artery. X-rays are then taken. The X-rays will show if there is a blockage or problem in a blood vessel.  LET [facility] CARE PROVIDER KNOW ABOUT:  Any allergies you have, including allergies to shellfish or contrast dye.   All medicines you are taking, including vitamins, herbs, eye drops, creams, and over-the-counter medicines.   Previous problems you or members of your family have had with the use of anesthetics.   Any blood disorders you have.   Previous surgeries you have had.  Any previous kidney problems or failure  you have had.  Medical conditions you have.   Possibility of pregnancy, if this applies. RISKS AND COMPLICATIONS Generally, an angiogram is a safe procedure. However, as with any procedure, problems can occur. Possible problems include:  Injury to the blood vessels, including rupture or bleeding.  Infection or bruising at the catheter site.  Allergic reaction to the dye or contrast used.  Kidney damage from the dye or contrast used.  Blood clots that can lead to a stroke or heart attack. BEFORE THE PROCEDURE  Do not eat or drink after midnight on the night before the procedure, or as directed by your health care provider.   Ask  your health care provider if you may drink enough water to take any needed medicines the morning of the procedure.  PROCEDURE  You may be given a medicine to help you relax (sedative) before and during the procedure. This medicine is given through an IV access tube that is inserted into one of your veins.   The area where the catheter will be inserted will be washed and shaved. This is usually done in the groin but may be done in the fold of your arm (near your elbow) or in the wrist.  A medicine will be given to numb the area where the catheter will be inserted (local anesthetic).  The catheter will be inserted with a guide wire into an artery. The catheter is guided by using a type of X-ray (fluoroscopy) to the blood vessel being examined.   Dye is then injected into the catheter, and X-rays are taken. The dye helps to show where any narrowing or blockages are located.  AFTER THE PROCEDURE   If the procedure is done through the leg, you will be kept in bed lying flat for several hours. You will be instructed to not bend or cross your legs.  The insertion site will be checked frequently.  The pulse in your feet or wrist will be checked frequently.  Additional blood tests, X-rays, and electrocardiography may be done.   You may need to stay in the hospital overnight for observation.  Document Released: 05/19/2005 Document Revised: 08/14/2013 Document Reviewed: 01/10/2013 [facility] Patient Information 2015 [place], [place]. This information is not intended to replace advice given to you by your health care provider. Make sure you discuss any questions you have with your health care provider.  No driving for 48 hours. No lifting over 5 lbs for 1 week. No sexual activity for 1 week. Keep procedure site clean & dry. If you notice increased pain, swelling, bleeding or pus, call/return!  You may shower, but no soaking baths/hot tubs/pools for 1 week.

## 2014-03-22 NOTE — Discharge Summary (Signed)
Discharge Summary   Patient ID: [name],  MRN: [id number], DOB/AGE: August 25, 1945 68 y.o.  Admit date: 03/21/2014 Discharge date: 03/22/2014  Primary Care Provider: [name] Primary Cardiologist: Dr. Lubertha South. Gwenlyn Found  Discharge Diagnoses Principal Problem:   Claudication Active Problems:   HYPOTHYROIDISM   HYPERLIPIDEMIA   TOBACCO ABUSE   HYPERTENSION   GERD   CAD   COPD (chronic obstructive pulmonary disease)   PAD (peripheral artery disease)   Allergies No Known Allergies  Procedures  PV Angiogram/Intervention  HEMODYNAMICS:  AO SYSTOLIC/AO DIASTOLIC: 973/53  Angiographic Data:  1: Abdominal aortogram-the distal dominant order was free of significant disease  2: Left lower extremity-was a 70% stenosis in the distal left common iliac artery. There was an 80% segmental mid left SFA with one vessel runoff via the peroneal  3: Right lower extremity-there was a 50% segmental proximal right SFA, short segment occlusion mid right SFA with 75% segmental stenosis prior to that. There is one-vessel runoff via the peroneal.  IMPRESSION:short segment occlusion mid right SFA with moderate segmental diseased proximal to this and one vessel runoff. We will proceed with directional atherectomy plus minus drug-eluting balloon angioplasty.  Final Impression: successful HAWK 1 directional atherectomy right SFA chronic total occlusion with drug eluting balloon angioplasty with an excellent angiographic result. The sheath will be removed once the ACT falls below 170 her pressure will be held. The patient will be treated with dual antiplatelet therapy. She'll be discharged home in the morning to get followup lower extremity arterial Doppler studies in our [facility] office. She will see a mid-level provider in one to 2 weeks afterwards on the data I am in the office.   Hospital Course  The patient is a pleasant 68 year old African American female with past medical history of  hypothyroidism, hyperlipidemia, hypertension and coronary artery disease status post CABG in March 2012. She has been experiencing pain in her lower extremity during ambulation. She was referred to Dr. Gwenlyn Found for further evaluation. A lower extremity arterial Doppler was performed on 03/06/2014 which revealed a right ABI of 0.43 with occluded distal right SFA and popliteal artery, the left ABI of 0.73 with high-frequency signal in the mid left SFA. Lower extremity angiography with potential percutaneous intervention was scheduled for 03/21/2014. She underwent a scheduled angiography which showed normal abdominal aortogram with minimal disease, 70% stenosis in distal left common iliac artery, 80% segmental mid left SFA with one vessel runoff via peroneal, 50% segmental proximal right SFA stenosis, 70% short segmental occlusion in mid right SFA with one-vessel runoff via peroneal. She underwent successful HAWK 1 directional atherectomy for right SFA chronic total occlusion with drug-eluting balloon angioplasty.   She was seen the morning of 03/22/2014, at which time she'll was doing well. She ambulated in the hallway without significant discomfort. She is deemed stable for discharge from cardiology perspective. I have scheduled the earliest appointment in the [facility] clinic with an APP on the same day Dr. Gwenlyn Found is in the clinic as well. [facility] office will call the patient to schedule outpatient right lower extremity Doppler in 2 weeks after discharge.   Discharge Vitals Blood pressure 114/53, pulse 73, temperature 97.7 F (36.5 C), temperature source Oral, resp. rate 16, height 5\' 3"  (1.6 m), weight 123 lb 10.9 oz (56.1 kg), SpO2 92.00%.  Filed Weights   03/21/14 0935 03/22/14 0009 03/22/14 0400  Weight: 121 lb (54.885 kg) 123 lb 10.9 oz (56.1 kg) 123 lb 10.9 oz (56.1 kg)    Labs  CBC  Recent Labs  03/22/14 0452  WBC 7.2  HGB 11.1*  HCT 34.9*  MCV 88.8  PLT 801   Basic Metabolic  Panel  Recent Labs  03/22/14 0452  NA 140  K 4.1  CL 104  CO2 24  GLUCOSE 111*  BUN 9  CREATININE 0.68  CALCIUM 8.9    Disposition  Pt is being discharged home today in good condition.  Follow-up Plans & Appointments      Follow-up Information   Follow up with [facility] R, NP On 04/16/2014. (3:00pm.)    Specialty:  Cardiology   Contact information:   [address] [place] [address] [phone]       Follow up with [facility]. (Office will call patient to schedule lower extremity arterial doppler in 2 weeks. Please call us if you don't hear from [place] in 2 business days)    Specialty:  Cardiology   Contact information:   [address] [place] [address] [phone]      Discharge Medications    Medication List    STOP taking these medications       aspirin 325 MG tablet  Replaced by:  aspirin 81 MG EC tablet     BC HEADACHE PO      TAKE these medications       amLODipine 10 MG tablet  Commonly known as:  NORVASC  Take 10 mg by mouth daily.     aspirin 81 MG EC tablet  Take 1 tablet (81 mg total) by mouth daily.     CALCIUM 1000 + D PO  Take 1 tablet by mouth daily.     clopidogrel 75 MG tablet  Commonly known as:  PLAVIX  Take 1 tablet (75 mg total) by mouth daily with breakfast.     fluticasone 50 MCG/ACT nasal spray  Commonly known as:  FLONASE  Place 2 sprays into the nose daily.     hydrochlorothiazide 25 MG tablet  Commonly known as:  HYDRODIURIL  Take 25 mg by mouth daily.     levothyroxine 112 MCG tablet  Commonly known as:  SYNTHROID, LEVOTHROID  Take 112 mcg by mouth daily before breakfast.     meloxicam 15 MG tablet  Commonly known as:  MOBIC  Take 1 tablet (15 mg total) by mouth daily.     metoprolol tartrate 25 MG tablet  Commonly known as:  LOPRESSOR  Take 25 mg by mouth 2 (two) times daily.     multivitamin capsule  Take 1 capsule by mouth daily.     PRENATAL  VITAMINS PO  Take 1 capsule by mouth daily.     ramipril 10 MG capsule  Commonly known as:  ALTACE  Take 1 capsule (10 mg total) by mouth daily.     Vitamin D (Ergocalciferol) 50000 UNITS Caps capsule  Commonly known as:  DRISDOL  Take 50,000 Units by mouth every 7 (seven) days.        Outstanding Labs/Studies  Outpatient R LE doppler in 2 weeks  Duration of Discharge Encounter   Greater than 30 minutes including physician time.  Signed, Almyra Deforest PA-C 03/22/2014, 11:11 AM

## 2014-03-22 NOTE — Discharge Summary (Signed)
Ready for discharge with follow-up via the [facility] and Dr. Gwenlyn Found in 7-14 days. Renal function and hemoglobin are stable. Cath site with no hematoma and lower extremities are warm.  The clinical data base was reviewed. All aspects of care were discussed with Almyra Deforest, PA-C. The discharge note is accurate.

## 2014-03-22 NOTE — Progress Notes (Signed)
Patient Name: [name] Date of Encounter: 03/22/2014     Active Problems:   Claudication    SUBJECTIVE  No CP or SOB. Feeling well  CURRENT MEDS . amLODipine  10 mg Oral Daily  . aspirin EC  81 mg Oral Daily  . clopidogrel  75 mg Oral Q breakfast  . hydrochlorothiazide  25 mg Oral Daily  . levothyroxine  112 mcg Oral QAC breakfast  . metoprolol tartrate  25 mg Oral BID  . ramipril  10 mg Oral Daily    OBJECTIVE  Filed Vitals:   03/22/14 0300 03/22/14 0400 03/22/14 0446 03/22/14 0752  BP: 126/61 124/56 123/59 114/53  Pulse: 70 66 83 73  Temp:   97.8 F (36.6 C) 97.7 F (36.5 C)  TempSrc:   Oral Oral  Resp: 18 18 18 16   Height:      Weight:  123 lb 10.9 oz (56.1 kg)    SpO2: 94% 93% 93% 92%    Intake/Output Summary (Last 24 hours) at 03/22/14 0753 Last data filed at 03/22/14 0500  Gross per 24 hour  Intake   1140 ml  Output    850 ml  Net    290 ml   Filed Weights   03/21/14 0935 03/22/14 0009 03/22/14 0400  Weight: 121 lb (54.885 kg) 123 lb 10.9 oz (56.1 kg) 123 lb 10.9 oz (56.1 kg)    PHYSICAL EXAM  General: Pleasant, NAD. Neuro: Alert and oriented X 3. Moves all extremities spontaneously. Psych: Normal affect. HEENT:  Normal  Neck: Supple without bruits or JVD. Lungs:  Resp regular and unlabored, CTA. Heart: RRR no s3, s4, or murmurs. Abdomen: Soft, non-tender, non-distended, BS + x 4.  Extremities: No clubbing, cyanosis or edema. DP/PT/Radials 2+ and equal bilaterally.  Accessory Clinical Findings  CBC  Recent Labs  03/22/14 0452  WBC 7.2  HGB 11.1*  HCT 34.9*  MCV 88.8  PLT 588   Basic Metabolic Panel  Recent Labs  03/22/14 0452  NA 140  K 4.1  CL 104  CO2 24  GLUCOSE 111*  BUN 9  CREATININE 0.68  CALCIUM 8.9    TELE  NSR with HR 60-80s, occasional PVCs, no significant ventricular ectopy  ECG  No recent EG  Radiology/Studies  Dg Chest 2 View  03/19/2014   CLINICAL DATA:  Preop angiogram.  Cough and  history of smoking.  EXAM: CHEST  2 VIEW  COMPARISON:  01/19/2014  FINDINGS: Sternotomy wires unchanged. Lungs are adequately inflated without consolidation or effusion. There is stable biapical pleural thickening. Cardiomediastinal silhouette is within normal. There is calcified plaque over the thoracic aorta. There are mild degenerative changes of the spine. Remainder of the exam is unchanged.  IMPRESSION: No active cardiopulmonary disease.   Electronically Signed   By: Marin Olp M.D.   On: 03/19/2014 08:44   US Abdomen Complete  03/01/2014   CLINICAL DATA:  Right upper quadrant abdominal pain.  EXAM: ULTRASOUND ABDOMEN COMPLETE  COMPARISON:  Ultrasound of November 16, 2007.  FINDINGS: Gallbladder:  No gallstones or wall thickening visualized. No sonographic Murphy sign noted.  Common bile duct:  Diameter: Measures 5.5 mm which is within normal limits.  Liver:  Hepatic parenchyma is slightly heterogeneous in appearance. Mildly septated cyst measuring 1.4 x 1.2 cm is noted in right hepatic lobe.  IVC:  No abnormality visualized.  Pancreas:  Visualized portion unremarkable.  Spleen:  Size and appearance within normal limits.  Right Kidney:  Length: 11.6  cm. Echogenicity within normal limits. No hydronephrosis visualized. 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  Left Kidney:  Length: 11.3 cm. Echogenicity within normal limits. No mass or hydronephrosis visualized.  Abdominal aorta:  No aneurysm is noted.  Atheromatous disease is noted.  Other findings:  None.  IMPRESSION: 2.7 mm echogenic focus noted in medial cortex of right kidney which may represent cortical calcification or small angiomyolipoma.  1.4 cm mildly septated cyst seen in right hepatic lobe. Followup ultrasound in 6 months is recommended to ensure stability.  No other significant abnormality seen in the abdomen.   Electronically Signed   By: Sabino Dick M.D.   On: 03/01/2014 09:40     ASSESSMENT AND PLAN  1. PAD  - LE arterial Doppler studies 03/06/14 right ABI 0.43 with an occluded distal right SFA and popliteal artery, left ABI 0.73 with a high-frequency signal in the mid left SFA  - directional atherectomy of R SFA chronic total occlusion with drug eluting ballon angioplasty  - continue ASA and plavix  - discharge today, outpatient doppler and follow up  2. CAD 3. HTN 4. Hyperlipidemia   Signed, Almyra Deforest PA-C Pager: [phone]

## 2014-03-22 NOTE — Progress Notes (Signed)
Patient discharge instructions given and explained to patient.  Discharged home with family.  All questions answered.

## 2014-03-22 NOTE — Progress Notes (Signed)
Ready for discharge with follow-up via the [facility] and Dr. Gwenlyn Found in 7-14 days. Renal function and hemoglobin are stable. Cath site with no hematoma and lower extremities are warm.

## 2014-04-04 ENCOUNTER — Ambulatory Visit (HOSPITAL_COMMUNITY)
Admit: 2014-04-04 | Discharge: 2014-04-04 | Disposition: A | Discharge status: Home / Self Care | Payer: Medicare Other | Source: Ambulatory Visit | Attending: Physician Assistant | Admitting: Physician Assistant

## 2014-04-04 DIAGNOSIS — I70219 Atherosclerosis of native arteries of extremities with intermittent claudication, unspecified extremity: Secondary | ICD-10-CM

## 2014-04-04 DIAGNOSIS — I739 Peripheral vascular disease, unspecified: Secondary | ICD-10-CM | POA: Insufficient documentation

## 2014-04-04 NOTE — Progress Notes (Signed)
Right Lower Ext. Limited Arterial Duplex Completed. Oda Cogan, BS, RDMS, RVT

## 2014-04-08 ENCOUNTER — Telehealth: Payer: Self-pay | Admitting: Physician Assistant

## 2014-04-08 ENCOUNTER — Emergency Department (HOSPITAL_COMMUNITY)
Admission: EM | Admit: 2014-04-08 | Discharge: 2014-04-08 | Disposition: A | Discharge status: Home / Self Care | Payer: Medicare Other | Attending: Emergency Medicine | Admitting: Emergency Medicine

## 2014-04-08 ENCOUNTER — Encounter (HOSPITAL_COMMUNITY): Payer: Self-pay | Admitting: Emergency Medicine

## 2014-04-08 DIAGNOSIS — I10 Essential (primary) hypertension: Secondary | ICD-10-CM | POA: Diagnosis not present

## 2014-04-08 DIAGNOSIS — M949 Disorder of cartilage, unspecified: Secondary | ICD-10-CM | POA: Diagnosis not present

## 2014-04-08 DIAGNOSIS — Z860100 Personal history of colon polyps, unspecified: Secondary | ICD-10-CM | POA: Insufficient documentation

## 2014-04-08 DIAGNOSIS — Z8669 Personal history of other diseases of the nervous system and sense organs: Secondary | ICD-10-CM | POA: Diagnosis not present

## 2014-04-08 DIAGNOSIS — E559 Vitamin D deficiency, unspecified: Secondary | ICD-10-CM | POA: Insufficient documentation

## 2014-04-08 DIAGNOSIS — E039 Hypothyroidism, unspecified: Secondary | ICD-10-CM | POA: Diagnosis not present

## 2014-04-08 DIAGNOSIS — J4489 Other specified chronic obstructive pulmonary disease: Secondary | ICD-10-CM | POA: Diagnosis not present

## 2014-04-08 DIAGNOSIS — Z7982 Long term (current) use of aspirin: Secondary | ICD-10-CM | POA: Diagnosis not present

## 2014-04-08 DIAGNOSIS — M899 Disorder of bone, unspecified: Secondary | ICD-10-CM | POA: Insufficient documentation

## 2014-04-08 DIAGNOSIS — Z79899 Other long term (current) drug therapy: Secondary | ICD-10-CM | POA: Insufficient documentation

## 2014-04-08 DIAGNOSIS — M7989 Other specified soft tissue disorders: Secondary | ICD-10-CM | POA: Insufficient documentation

## 2014-04-08 DIAGNOSIS — Z8659 Personal history of other mental and behavioral disorders: Secondary | ICD-10-CM | POA: Insufficient documentation

## 2014-04-08 DIAGNOSIS — IMO0002 Reserved for concepts with insufficient information to code with codable children: Secondary | ICD-10-CM | POA: Insufficient documentation

## 2014-04-08 DIAGNOSIS — M5030 Other cervical disc degeneration, unspecified cervical region: Secondary | ICD-10-CM | POA: Insufficient documentation

## 2014-04-08 DIAGNOSIS — J449 Chronic obstructive pulmonary disease, unspecified: Secondary | ICD-10-CM | POA: Diagnosis not present

## 2014-04-08 DIAGNOSIS — K219 Gastro-esophageal reflux disease without esophagitis: Secondary | ICD-10-CM | POA: Diagnosis not present

## 2014-04-08 DIAGNOSIS — I2510 Atherosclerotic heart disease of native coronary artery without angina pectoris: Secondary | ICD-10-CM | POA: Insufficient documentation

## 2014-04-08 DIAGNOSIS — Z7902 Long term (current) use of antithrombotics/antiplatelets: Secondary | ICD-10-CM | POA: Insufficient documentation

## 2014-04-08 DIAGNOSIS — M1990 Unspecified osteoarthritis, unspecified site: Secondary | ICD-10-CM | POA: Insufficient documentation

## 2014-04-08 DIAGNOSIS — Z951 Presence of aortocoronary bypass graft: Secondary | ICD-10-CM | POA: Diagnosis not present

## 2014-04-08 DIAGNOSIS — Z87891 Personal history of nicotine dependence: Secondary | ICD-10-CM | POA: Diagnosis not present

## 2014-04-08 DIAGNOSIS — D649 Anemia, unspecified: Secondary | ICD-10-CM | POA: Insufficient documentation

## 2014-04-08 DIAGNOSIS — R011 Cardiac murmur, unspecified: Secondary | ICD-10-CM | POA: Insufficient documentation

## 2014-04-08 DIAGNOSIS — Z9861 Coronary angioplasty status: Secondary | ICD-10-CM | POA: Insufficient documentation

## 2014-04-08 DIAGNOSIS — E785 Hyperlipidemia, unspecified: Secondary | ICD-10-CM | POA: Diagnosis not present

## 2014-04-08 LAB — PROTIME-INR
INR: 0.98 (ref 0.00–1.49)
Prothrombin Time: 13 seconds (ref 11.6–15.2)

## 2014-04-08 LAB — CBC WITH DIFFERENTIAL/PLATELET
Basophils Absolute: 0 10*3/uL (ref 0.0–0.1)
Basophils Relative: 1 % (ref 0–1)
Eosinophils Absolute: 0.2 10*3/uL (ref 0.0–0.7)
Eosinophils Relative: 4 % (ref 0–5)
HCT: 36.9 % (ref 36.0–46.0)
Hemoglobin: 11.9 g/dL — ABNORMAL LOW (ref 12.0–15.0)
Lymphocytes Relative: 29 % (ref 12–46)
Lymphs Abs: 1.5 10*3/uL (ref 0.7–4.0)
MCH: 28.3 pg (ref 26.0–34.0)
MCHC: 32.2 g/dL (ref 30.0–36.0)
MCV: 87.6 fL (ref 78.0–100.0)
Monocytes Absolute: 0.4 10*3/uL (ref 0.1–1.0)
Monocytes Relative: 7 % (ref 3–12)
Neutro Abs: 3.2 10*3/uL (ref 1.7–7.7)
Neutrophils Relative %: 61 % (ref 43–77)
Platelets: 324 10*3/uL (ref 150–400)
RBC: 4.21 MIL/uL (ref 3.87–5.11)
RDW: 14.7 % (ref 11.5–15.5)
WBC: 5.2 10*3/uL (ref 4.0–10.5)

## 2014-04-08 LAB — COMPREHENSIVE METABOLIC PANEL
ALT: 18 U/L (ref 0–35)
AST: 27 U/L (ref 0–37)
Albumin: 3.5 g/dL (ref 3.5–5.2)
Alkaline Phosphatase: 95 U/L (ref 39–117)
Anion gap: 14 (ref 5–15)
BUN: 17 mg/dL (ref 6–23)
CO2: 24 mEq/L (ref 19–32)
Calcium: 9.2 mg/dL (ref 8.4–10.5)
Chloride: 107 mEq/L (ref 96–112)
Creatinine, Ser: 0.56 mg/dL (ref 0.50–1.10)
GFR calc Af Amer: >90 mL/min (ref >90)
GFR calc non Af Amer: >90 mL/min (ref >90)
Glucose, Bld: 97 mg/dL (ref 70–99)
Potassium: 3.4 mEq/L — ABNORMAL LOW (ref 3.7–5.3)
Sodium: 145 mEq/L (ref 137–147)
Total Bilirubin: 0.2 mg/dL — ABNORMAL LOW (ref 0.3–1.2)
Total Protein: 6.8 g/dL (ref 6.0–8.3)

## 2014-04-08 MED ORDER — ENOXAPARIN SODIUM 100 MG/ML ~~LOC~~ SOLN
1.0000 mg/kg | Freq: Once | SUBCUTANEOUS | Status: DC
  Filled 2014-04-08: qty 1

## 2014-04-08 MED ORDER — ENOXAPARIN SODIUM 60 MG/0.6ML ~~LOC~~ SOLN
55.0000 mg | Freq: Once | SUBCUTANEOUS | Status: AC
  Administered 2014-04-08: 55 mg via SUBCUTANEOUS
  Filled 2014-04-08: qty 0.6

## 2014-04-08 NOTE — ED Notes (Signed)
Pt very upset. Cardiology re paged. Pt wants to go. States that we lied to her and left and did not do anything. Pt explained that we are waiting for the page to be returned.

## 2014-04-08 NOTE — ED Notes (Signed)
Vascular dept have left. No answer to phone. Pt states she is going to leave.

## 2014-04-08 NOTE — Telephone Encounter (Signed)
Patient called after-hours answering service with question about leg swelling. Has h/o COPD, CAD, HTN, PAD s/p recent lower extremity intervention. Her right lower extremity has been swollen from the foot up ever since yesterday - this is new for her. She continues to have claudication sx in that leg. Sensation in tact. No known hx of DVT or PE. Otherwise feels OK. Given that I cannot exclude DVT or vascular complication over the phone I advised she proceed to ED for in-person evaluation since office is closed. I instructed her not to drive herself but she insists on doing so, accepting the risk. She declined EMS as a backup option.   She had a LE arterial duplex study on 8/13 for routine followup but has not had a venous duplex - I made sure she understood the difference so she doesn't confuse the ED by telling them she just had an ultrasound of her leg. She is in agreement with proceeding to the ER and will do so now. Dayna Dunn PA-C

## 2014-04-08 NOTE — ED Notes (Signed)
Pt in c/o swelling to right lower leg since yesterday, pt recently had a 100% blockage to right leg and they went in through her groin to open it up on 7/30, Dr. Gwenlyn Found was MD- swelling noted to right lower leg, CMS intact but discoloration noted to second and third toe, pt states they do not look normal to her in color, cap refill <3 seconds- pt reports increased pain with ambulation

## 2014-04-08 NOTE — ED Notes (Signed)
Pt told to come back at 0800 for dopplers

## 2014-04-08 NOTE — ED Provider Notes (Signed)
CSN: [id number]     Arrival date & time 04/08/14  1808 History   First MD Initiated Contact with Patient 04/08/14 2006     Chief Complaint  Patient presents with  . Leg Swelling     (Consider location/radiation/quality/duration/timing/severity/associated sxs/prior Treatment) Patient is a 68 y.o. female presenting with leg pain.  Leg Pain Location:  Ankle Injury: no   Ankle location:  R ankle Pain details:    Quality:  Dull   Radiates to:  Does not radiate   Severity:  Moderate   Onset quality:  Gradual   Duration:  1 day   Timing:  Constant   Progression:  Unchanged Chronicity:  New Relieved by:  Nothing Worsened by:  Bearing weight Ineffective treatments:  None tried Associated symptoms: swelling   Associated symptoms: no fever and no numbness     Past Medical History  Diagnosis Date  . Coronary artery disease   . Hyperlipidemia   . History of rheumatic fever   . Anemia   . OSTEOPENIA   . Insomnia   . COLONIC POLYPS, HX OF   . Hypothyroidism   . Hypertension   . Gastroesophageal reflux disease   . PVD (peripheral vascular disease) 10/31/2011  . Vitamin D deficiency   . RSD (reflex sympathetic dystrophy) 10/31/2011  . PAD (peripheral artery disease) 12/03/2011  . Heart murmur     as a child  . COPD (chronic obstructive pulmonary disease)     no per pt on 03/21/2014  . Osteoarthritis   . DISC DISEASE, CERVICAL   . Arthritis     "my whole body"  . Anxiety     hx   Past Surgical History  Procedure Laterality Date  . Abdominal hysterectomy  1977    "partial"  . Thyroidectomy  1995  . Tonsillectomy    . Carpal tunnel release Right 2005  . Cataract extraction Left 2013  . Balloon angioplasty, artery Right 03/21/2014    SFA  . Dilation and curettage of uterus  1978  . Coronary artery bypass graft  10/2009    LIMA to the LAD, saphenous vein graft to the acute marginal, saphenous vein graft to the diagonal and obtuse marginal.   Family History  Problem  Relation Age of Onset  . Arthritis Maternal Aunt   . Heart disease Maternal Aunt   . Hypertension Maternal Aunt   . Diabetes Maternal Aunt   . Colon cancer Neg Hx   . Kidney disease Neg Hx   . Liver disease Neg Hx   . Throat cancer Neg Hx   . Stomach cancer Neg Hx    History  Substance Use Topics  . Smoking status: Former Smoker -- 0.80 packs/day for 45 years    Types: Cigarettes    Quit date: 11/05/2010  . Smokeless tobacco: Never Used  . Alcohol Use: No   OB History   Grav Para Term Preterm Abortions TAB SAB Ect Mult Living                 Review of Systems  Constitutional: Negative for fever.  All other systems reviewed and are negative.     Allergies  Review of patient's allergies indicates no known allergies.  Home Medications   Prior to Admission medications   Medication Sig Start Date End Date Taking? Authorizing Provider  amLODipine (NORVASC) 10 MG tablet Take 10 mg by mouth at bedtime.    Yes Historical Provider, MD  aspirin EC 81 MG EC tablet Take  1 tablet (81 mg total) by mouth daily. 03/22/14  Yes Almyra Deforest, PA  Aspirin-Salicylamide-Caffeine (BC HEADACHE POWDER PO) Take 1 packet by mouth 3 (three) times daily as needed (headache).   Yes Historical Provider, MD  Calcium Carb-Cholecalciferol (CALCIUM 1000 + D PO) Take 1 tablet by mouth daily at 2 PM daily at 2 PM.    Yes Historical Provider, MD  clopidogrel (PLAVIX) 75 MG tablet Take 1 tablet (75 mg total) by mouth daily with breakfast. 03/22/14  Yes Almyra Deforest, PA  hydrochlorothiazide (HYDRODIURIL) 25 MG tablet Take 25 mg by mouth daily after lunch.  10/28/11  Yes Biagio Borg, MD  levothyroxine (SYNTHROID, LEVOTHROID) 112 MCG tablet Take 112 mcg by mouth daily before breakfast.   Yes Historical Provider, MD  metoprolol tartrate (LOPRESSOR) 25 MG tablet Take 25 mg by mouth 2 (two) times daily. Take 1 tablet (25 mg) daily at 11am and 3pm 05/11/13  Yes Biagio Borg, MD  Multiple Vitamin (MULTIVITAMIN WITH MINERALS) TABS  tablet Take 1 tablet by mouth daily. Centrum   Yes Historical Provider, MD  PRENATAL VITAMINS PO Take 1 capsule by mouth daily with supper.    Yes Historical Provider, MD  ramipril (ALTACE) 10 MG capsule Take 1 capsule (10 mg total) by mouth daily. 05/11/13  Yes Biagio Borg, MD  Vitamin D, Ergocalciferol, (DRISDOL) 50000 UNITS CAPS Take 50,000 Units by mouth every 7 (seven) days. On Tuesdays   Yes Historical Provider, MD   BP 163/72  Pulse 82  Temp(Src) 97.8 F (36.6 C) (Oral)  Resp 20  Ht 5\' 3"  (1.6 m)  Wt 122 lb (55.339 kg)  BMI 21.62 kg/m2  SpO2 97% Physical Exam  Nursing note and vitals reviewed. Constitutional: She is oriented to person, place, and time. She appears well-developed and well-nourished. No distress.  HENT:  Head: Normocephalic and atraumatic.  Eyes: Conjunctivae are normal. No scleral icterus.  Neck: Neck supple.  Cardiovascular: Normal rate and intact distal pulses.   Pulmonary/Chest: Effort normal. No stridor. No respiratory distress.  Abdominal: Normal appearance. She exhibits no distension.  Musculoskeletal:  Minimal swelling of right ankle compared to left.  1+ palpable pulses in BLE.  Both feet are warm and well perfused with good cap refill.    Neurological: She is alert and oriented to person, place, and time.  Skin: Skin is warm and dry. No rash noted.  Psychiatric: She has a normal mood and affect. Her behavior is normal.    ED Course  Procedures (including critical care time) Labs Review Labs Reviewed  CBC WITH DIFFERENTIAL - Abnormal; Notable for the following:    Hemoglobin 11.9 (*)    All other components within normal limits  COMPREHENSIVE METABOLIC PANEL - Abnormal; Notable for the following:    Potassium 3.4 (*)    Total Bilirubin 0.2 (*)    All other components within normal limits  PROTIME-INR    Imaging Review No results found.   EKG Interpretation None      MDM   Final diagnoses:  Right leg swelling    68 yo female  with right leg pain and swelling.  Advised to come to the ED for possible [place].  Unfortunately, we do not have vascular US available.  I discussed with Dr. Aundra Dubin (Cardiology) regarding lovenox within month of her angiography/angioplasty.  He felt it would be ok to give dose of lovenox.    Her exam demonstrates good arterial flow, I am not concerned about acute arterial occlusion  as a cause of her symptoms.  She will return tomorrow for an [place].    Houston Siren III, MD 04/08/14 [phone]

## 2014-04-08 NOTE — Discharge Instructions (Signed)
Peripheral Edema °You have swelling in your legs (peripheral edema). This swelling is due to excess accumulation of salt and water in your body. Edema may be a sign of heart, kidney or liver disease, or a side effect of a medication. It may also be due to problems in the leg veins. Elevating your legs and using special support stockings may be very helpful, if the cause of the swelling is due to poor venous circulation. Avoid long periods of standing, whatever the cause. °Treatment of edema depends on identifying the cause. Chips, pretzels, pickles and other salty foods should be avoided. Restricting salt in your diet is almost always needed. Water pills (diuretics) are often used to remove the excess salt and water from your body via urine. These medicines prevent the kidney from reabsorbing sodium. This increases urine flow. °Diuretic treatment may also result in lowering of potassium levels in your body. Potassium supplements may be needed if you have to use diuretics daily. Daily weights can help you keep track of your progress in clearing your edema. You should call your caregiver for follow up care as recommended. °SEEK IMMEDIATE MEDICAL CARE IF:  °· You have increased swelling, pain, redness, or heat in your legs. °· You develop shortness of breath, especially when lying down. °· You develop chest or abdominal pain, weakness, or fainting. °· You have a fever. °Document Released: 09/16/2004 Document Revised: 11/01/2011 Document Reviewed: 08/27/2009 °ExitCare® Patient Information ©2015 [employer]. This information is not intended to replace advice given to you by your health care provider. Make sure you discuss any questions you have with your health care provider. ° °

## 2014-04-08 NOTE — ED Notes (Signed)
Pt out at [place]. Explained to patient waiting on cardiology and apologized to patient. Pt still cursing at me and the MD.

## 2014-04-09 ENCOUNTER — Ambulatory Visit (HOSPITAL_COMMUNITY)
Admission: RE | Admit: 2014-04-09 | Discharge: 2014-04-09 | Disposition: A | Discharge status: Home / Self Care | Payer: Medicare Other | Source: Ambulatory Visit | Attending: Emergency Medicine | Admitting: Emergency Medicine

## 2014-04-09 DIAGNOSIS — M79609 Pain in unspecified limb: Secondary | ICD-10-CM | POA: Diagnosis not present

## 2014-04-09 NOTE — Telephone Encounter (Signed)
Can you follow up with [name] and see what happened to she leg swelling?  JJB

## 2014-04-09 NOTE — Progress Notes (Signed)
*  PRELIMINARY RESULTS* Vascular Ultrasound Right lower extremity venous duplex has been completed.  Preliminary findings: No evidence of DVT or baker's cyst.  Landry Mellow, RDMS, RVT  04/09/2014, 9:01 AM

## 2014-04-11 ENCOUNTER — Telehealth: Payer: Self-pay | Admitting: *Deleted

## 2014-04-11 DIAGNOSIS — I739 Peripheral vascular disease, unspecified: Secondary | ICD-10-CM

## 2014-04-11 NOTE — Telephone Encounter (Signed)
Order placed for repeat lower extremity arterial doppler in 6 months  

## 2014-04-11 NOTE — Telephone Encounter (Signed)
Message copied by Chauncy Lean on Thu Apr 11, 2014  8:53 PM ------      Message from: Lorretta Harp      Created: Tue Apr 09, 2014  8:57 PM       Marked improvement in RABI s/p intervention. Repeat 6 months ------

## 2014-04-12 NOTE — Telephone Encounter (Signed)
lmom 

## 2014-04-15 DIAGNOSIS — M1710 Unilateral primary osteoarthritis, unspecified knee: Secondary | ICD-10-CM | POA: Diagnosis not present

## 2014-04-16 ENCOUNTER — Encounter: Payer: Self-pay | Admitting: Nurse Practitioner

## 2014-04-16 ENCOUNTER — Ambulatory Visit (INDEPENDENT_AMBULATORY_CARE_PROVIDER_SITE_OTHER): Payer: Medicare Other | Admitting: Cardiology

## 2014-04-16 ENCOUNTER — Encounter: Payer: Self-pay | Admitting: Cardiology

## 2014-04-16 ENCOUNTER — Ambulatory Visit (INDEPENDENT_AMBULATORY_CARE_PROVIDER_SITE_OTHER): Payer: Medicare Other | Admitting: Nurse Practitioner

## 2014-04-16 VITALS — BP 138/76 | HR 80 | Temp 98.2°F | Resp 20 | Wt 133.8 lb

## 2014-04-16 VITALS — BP 172/99 | HR 87 | Ht 63.0 in | Wt 134.0 lb

## 2014-04-16 DIAGNOSIS — E782 Mixed hyperlipidemia: Secondary | ICD-10-CM | POA: Diagnosis not present

## 2014-04-16 DIAGNOSIS — K089 Disorder of teeth and supporting structures, unspecified: Secondary | ICD-10-CM | POA: Diagnosis not present

## 2014-04-16 DIAGNOSIS — I739 Peripheral vascular disease, unspecified: Secondary | ICD-10-CM | POA: Diagnosis not present

## 2014-04-16 DIAGNOSIS — K044 Acute apical periodontitis of pulpal origin: Secondary | ICD-10-CM | POA: Diagnosis not present

## 2014-04-16 DIAGNOSIS — Z79899 Other long term (current) drug therapy: Secondary | ICD-10-CM | POA: Diagnosis not present

## 2014-04-16 DIAGNOSIS — I2510 Atherosclerotic heart disease of native coronary artery without angina pectoris: Secondary | ICD-10-CM | POA: Diagnosis not present

## 2014-04-16 DIAGNOSIS — K0889 Other specified disorders of teeth and supporting structures: Secondary | ICD-10-CM

## 2014-04-16 DIAGNOSIS — K047 Periapical abscess without sinus: Secondary | ICD-10-CM

## 2014-04-16 MED ORDER — AMOXICILLIN 500 MG PO CAPS: 500.0000 mg | ORAL_CAPSULE | Freq: Three times a day (TID) | ORAL | Status: DC

## 2014-04-16 MED ORDER — ATORVASTATIN CALCIUM 20 MG PO TABS: 20.0000 mg | ORAL_TABLET | Freq: Every day | ORAL | Status: DC

## 2014-04-16 NOTE — Progress Notes (Signed)
04/18/2014   PCP: [name], MD   Chief Complaint  Patient presents with  . Follow-up    S/P hospital    Primary Cardiologist: Dr. Thresa Ross PV:  Dr. Adora Fridge   HPI:  Patient here today for followup status post PCI.  68 year old [place] American female patient of Dr. Jacalyn Lefevre referred for peripheral vascular evaluation.she was seen by Dr. Gladstone Lighter , orthopedic surgeon, who referred her here. Her past history is remarkable for ischemic heart disease status post coronary bypass grafting in March 2012 of the LIMA to LAD, vein to the diagonal branch, obtuse marginal branch and a vein to acute marginal branch. She has normal LV function by 2-D echo. Further problems include hypertension, and hyperlipidemia. She complains of right claudication and she has seen Dr. Fletcher Anon in the past with the thought that her pain was arthritic in nature. Lower extremity arterial Doppler studies performed 07/07/14 revealed a right ABI 0.43 with an occluded distal right SFA and popliteal artery, left ABI 0.73 with a high-frequency signal in the mid left SFA.  Patient underwent PV angiogram 03/21/2014 revealing short segment occlusion mid right SFA with moderate segmental diseased proximal to this and one vessel runoff.  Dr. Adora Fridge proceeded with  successful HAWK 1 directional atherectomy right SFA chronic total occlusion with drug eluting balloon angioplasty with an excellent angiographic result.   Post procedure patient has had complication with the right knee making it difficult for her to tell that her claudication has improved. She develops an effusion of the knee venous Dopplers were negative and she has had the knee aspirated and it is doing much better now. Post procedure Dopplers were improved with the right now at 0.70 ABI up from 0.43.    She also complains of tooth pain and she is planning to go for tooth extraction that Dr. Lowella Dandy not want her to stop her Plavix until mid September  and only long enough for extraction.   No Known Allergies  Current Outpatient Prescriptions  Medication Sig Dispense Refill  . amLODipine (NORVASC) 10 MG tablet Take 10 mg by mouth at bedtime.       Marland Kitchen amoxicillin (AMOXIL) 500 MG capsule Take 1 capsule (500 mg total) by mouth 3 (three) times daily.  21 capsule  0  . aspirin EC 81 MG EC tablet Take 1 tablet (81 mg total) by mouth daily.      . Aspirin-Salicylamide-Caffeine (BC HEADACHE POWDER PO) Take 1 packet by mouth 3 (three) times daily as needed (headache).      . Calcium Carb-Cholecalciferol (CALCIUM 1000 + D PO) Take 1 tablet by mouth daily at 2 PM daily at 2 PM.       . clopidogrel (PLAVIX) 75 MG tablet Take 1 tablet (75 mg total) by mouth daily with breakfast.  30 tablet  11  . hydrochlorothiazide (HYDRODIURIL) 25 MG tablet Take 25 mg by mouth daily after lunch.       . levothyroxine (SYNTHROID, LEVOTHROID) 112 MCG tablet Take 112 mcg by mouth daily before breakfast.      . metoprolol tartrate (LOPRESSOR) 25 MG tablet Take 25 mg by mouth 2 (two) times daily. Take 1 tablet (25 mg) daily at 11am and 3pm      . Multiple Vitamin (MULTIVITAMIN WITH MINERALS) TABS tablet Take 1 tablet by mouth daily. Centrum      . PRENATAL VITAMINS PO Take 1 capsule by mouth daily with supper.       Marland Kitchen  ramipril (ALTACE) 10 MG capsule Take 1 capsule (10 mg total) by mouth daily.  90 capsule  3  . Vitamin D, Ergocalciferol, (DRISDOL) 50000 UNITS CAPS Take 50,000 Units by mouth every 7 (seven) days. On Tuesdays      . atorvastatin (LIPITOR) 20 MG tablet Take 1 tablet (20 mg total) by mouth daily.  90 tablet  3   No current facility-administered medications for this visit.    Past Medical History  Diagnosis Date  . Coronary artery disease   . Hyperlipidemia   . History of rheumatic fever   . Anemia   . OSTEOPENIA   . Insomnia   . COLONIC POLYPS, HX OF   . Hypothyroidism   . Hypertension   . Gastroesophageal reflux disease   . PVD (peripheral  vascular disease) 10/31/2011  . Vitamin D deficiency   . RSD (reflex sympathetic dystrophy) 10/31/2011  . PAD (peripheral artery disease) 12/03/2011  . Heart murmur     as a child  . COPD (chronic obstructive pulmonary disease)     no per pt on 03/21/2014  . Osteoarthritis   . DISC DISEASE, CERVICAL   . Arthritis     "my whole body"  . Anxiety     hx    Past Surgical History  Procedure Laterality Date  . Abdominal hysterectomy  1977    "partial"  . Thyroidectomy  1995  . Tonsillectomy    . Carpal tunnel release Right 2005  . Cataract extraction Left 2013  . Balloon angioplasty, artery Right 03/21/2014    SFA  . Dilation and curettage of uterus  1978  . Coronary artery bypass graft  10/2009    LIMA to the LAD, saphenous vein graft to the acute marginal, saphenous vein graft to the diagonal and obtuse marginal.    LKG:MWNUUVO:ZD colds or fevers, no weight changes Skin:no rashes or ulcers HEENT:no blurred vision, no congestion CV:see HPI PUL:see HPI GI:no diarrhea constipation or melena, no indigestion GU:no hematuria, no dysuria MS:no joint pain, slowly seeing improvement in claudication Neuro:no syncope, no lightheadedness Endo:no diabetes, no thyroid disease  Wt Readings from Last 3 Encounters:  04/16/14 134 lb (60.782 kg)  04/16/14 133 lb 12.8 oz (60.691 kg)  04/08/14 122 lb (55.339 kg)    PHYSICAL EXAM BP 172/99  Pulse 87  Ht 5\' 3"  (1.6 m)  Wt 134 lb (60.782 kg)  BMI 23.74 kg/m2 General:Pleasant affect, NAD Skin:Warm and dry, brisk capillary refill HEENT:normocephalic, sclera clear, mucus membranes moist Neck:supple, no JVD, no bruits  Heart:S1S2 RRR without murmur, gallup, rub or click Lungs:clear without rales, rhonchi, or wheezes GUY:QIHK, non tender, + BS, do not palpate liver spleen or masses Ext:no lower ext edema,  2+ radial pulses Neuro:alert and oriented, MAE, follows commands, + facial symmetry   ASSESSMENT AND PLAN PAD (peripheral artery  disease) successful HAWK 1 directional atherectomy right SFA chronic total occlusion with drug eluting balloon angioplasty with an excellent angiographic result.  On 03/21/14 .  ABIs improved. Now that her knee effusion is improving she can see improvement in claudication.    Claudication improved  CAD stable  Tooth pain Patient was instructed not to stop Plavix to have anything done to her to use until mid September Due recent procedure in her right SFA  she should only hold Plavix in mid-September for 5 days for tooth extraction.   she will follow with Dr. 6 weeks.

## 2014-04-16 NOTE — Progress Notes (Signed)
Pre visit review using our clinic review tool, if applicable. No additional management support is needed unless otherwise documented below in the visit note. 

## 2014-04-16 NOTE — Patient Instructions (Signed)
  Take Amoxicillin until complete. Take pain medication as needed according to prescription. Call clinic if symptoms not resolved or worsen.  Keep follow up appointment with your Primary Care Physician  Dental Pain A tooth ache may be caused by cavities (tooth decay). Cavities expose the nerve of the tooth to air and hot or cold temperatures. It may come from an infection or abscess (also called a boil or furuncle) around your tooth. It is also often caused by dental caries (tooth decay). This causes the pain you are having. DIAGNOSIS  Your caregiver can diagnose this problem by exam. TREATMENT   If caused by an infection, it may be treated with medications which kill germs (antibiotics) and pain medications as prescribed by your caregiver. Take medications as directed.  Only take over-the-counter or prescription medicines for pain, discomfort, or fever as directed by your caregiver.  Whether the tooth ache today is caused by infection or dental disease, you should see your dentist as soon as possible for further care. SEEK MEDICAL CARE IF: The exam and treatment you received today has been provided on an emergency basis only. This is not a substitute for complete medical or dental care. If your problem worsens or new problems (symptoms) appear, and you are unable to meet with your dentist, call or return to this location. SEEK IMMEDIATE MEDICAL CARE IF:   You have a fever.  You develop redness and swelling of your face, jaw, or neck.  You are unable to open your mouth.  You have severe pain uncontrolled by pain medicine. MAKE SURE YOU:   Understand these instructions.  Will watch your condition.  Will get help right away if you are not doing well or get worse. Document Released: 08/09/2005 Document Revised: 11/01/2011 Document Reviewed: 03/27/2008 [facility] Patient Information 2015 [place], [place]. This information is not intended to replace advice given to you by your health care  provider. Make sure you discuss any questions you have with your health care provider.

## 2014-04-16 NOTE — Patient Instructions (Signed)
You are ok to hold your Plavix in mid September for 5 days prior to your procedure then resume  Take Lipitor 20 mg as directed  Have your labs done in 6 weeks  Your physician recommends that you schedule a follow-up appointment in:  6 weeks with Dr. Gwenlyn Found

## 2014-04-16 NOTE — Progress Notes (Signed)
Subjective:    Patient ID: [name], female    DOB: Oct 19, 1945, 68 y.o.   MRN: [id number]  HPIPatient is seen for complaints of teeth pain with associated infection. Pain radiates into right jaw.  She was due to have lower teeth pulled but unable to perform as patient recently underwent  Right femoral bypass and currently is taking Plavix.  Patient reports seeing dentist at [facility] and was told she needed antibiotic treatment since she is able to have teeth extracted currently.   Patient reports feeling well after femoral bypass.  She has recently underwent right knee effusion drainage.    Review of Systems  Constitutional: Negative.  Negative for chills.  HENT: Negative.  Negative for congestion, ear discharge, ear pain, postnasal drip, rhinorrhea, sinus pressure, sneezing, sore throat and trouble swallowing.   Eyes: Negative.   Respiratory: Negative.  Negative for cough, shortness of breath and wheezing.   Cardiovascular: Negative.  Negative for chest pain.  Musculoskeletal: Positive for joint swelling.       Right knee with history of effusion  Skin: Negative.   Psychiatric/Behavioral: Negative for behavioral problems and agitation.    Past Medical History  Diagnosis Date  . Coronary artery disease   . Hyperlipidemia   . History of rheumatic fever   . Anemia   . OSTEOPENIA   . Insomnia   . COLONIC POLYPS, HX OF   . Hypothyroidism   . Hypertension   . Gastroesophageal reflux disease   . PVD (peripheral vascular disease) 10/31/2011  . Vitamin D deficiency   . RSD (reflex sympathetic dystrophy) 10/31/2011  . PAD (peripheral artery disease) 12/03/2011  . Heart murmur     as a child  . COPD (chronic obstructive pulmonary disease)     no per pt on 03/21/2014  . Osteoarthritis   . DISC DISEASE, CERVICAL   . Arthritis     "my whole body"  . Anxiety     hx    History   Social History  . Marital Status: Single    Spouse Name: N/A    Number of Children: 2  .  Years of Education: N/A   Occupational History  . [employer]    Social History Main Topics  . Smoking status: Former Smoker -- 0.80 packs/day for 45 years    Types: Cigarettes    Quit date: 11/05/2010  . Smokeless tobacco: Never Used  . Alcohol Use: No  . Drug Use: No  . Sexual Activity: Not Currently   Other Topics Concern  . Not on file   Social History Narrative  . No narrative on file    Past Surgical History  Procedure Laterality Date  . Abdominal hysterectomy  1977    "partial"  . Thyroidectomy  1995  . Tonsillectomy    . Carpal tunnel release Right 2005  . Cataract extraction Left 2013  . Balloon angioplasty, artery Right 03/21/2014    SFA  . Dilation and curettage of uterus  1978  . Coronary artery bypass graft  10/2009    LIMA to the LAD, saphenous vein graft to the acute marginal, saphenous vein graft to the diagonal and obtuse marginal.    Family History  Problem Relation Age of Onset  . Arthritis Maternal Aunt   . Heart disease Maternal Aunt   . Hypertension Maternal Aunt   . Diabetes Maternal Aunt   . Colon cancer Neg Hx   . Kidney disease Neg Hx   . Liver disease Neg  Hx   . Throat cancer Neg Hx   . Stomach cancer Neg Hx     No Known Allergies  Current Outpatient Prescriptions on File Prior to Visit  Medication Sig Dispense Refill  . amLODipine (NORVASC) 10 MG tablet Take 10 mg by mouth at bedtime.       Marland Kitchen aspirin EC 81 MG EC tablet Take 1 tablet (81 mg total) by mouth daily.      . Aspirin-Salicylamide-Caffeine (BC HEADACHE POWDER PO) Take 1 packet by mouth 3 (three) times daily as needed (headache).      . Calcium Carb-Cholecalciferol (CALCIUM 1000 + D PO) Take 1 tablet by mouth daily at 2 PM daily at 2 PM.       . clopidogrel (PLAVIX) 75 MG tablet Take 1 tablet (75 mg total) by mouth daily with breakfast.  30 tablet  11  . hydrochlorothiazide (HYDRODIURIL) 25 MG tablet Take 25 mg by mouth daily after lunch.       . levothyroxine (SYNTHROID,  LEVOTHROID) 112 MCG tablet Take 112 mcg by mouth daily before breakfast.      . metoprolol tartrate (LOPRESSOR) 25 MG tablet Take 25 mg by mouth 2 (two) times daily. Take 1 tablet (25 mg) daily at 11am and 3pm      . Multiple Vitamin (MULTIVITAMIN WITH MINERALS) TABS tablet Take 1 tablet by mouth daily. Centrum      . PRENATAL VITAMINS PO Take 1 capsule by mouth daily with supper.       . ramipril (ALTACE) 10 MG capsule Take 1 capsule (10 mg total) by mouth daily.  90 capsule  3  . Vitamin D, Ergocalciferol, (DRISDOL) 50000 UNITS CAPS Take 50,000 Units by mouth every 7 (seven) days. On Tuesdays      . atorvastatin (LIPITOR) 20 MG tablet Take 1 tablet (20 mg total) by mouth daily.  90 tablet  3   No current facility-administered medications on file prior to visit.    BP 138/76  Pulse 80  Temp(Src) 98.2 F (36.8 C) (Oral)  Resp 20  Wt 133 lb 12.8 oz (60.691 kg)  SpO2 96%       Objective:   Physical Exam  Constitutional: She appears well-developed and well-nourished.  HENT:  Head: Normocephalic.  Right Ear: External ear normal.  Left Ear: External ear normal.  Nose: Nose normal.  Mouth/Throat: Oropharynx is clear and moist.  Lower incisors with inflamed gum line. Moderate pain noted in lower jaw and right lower jaw on palpation.  No lymphadenopathy noted  Eyes: Pupils are equal, round, and reactive to light.  Neck: Neck supple.  Cardiovascular: Normal rate, regular rhythm and normal heart sounds.   Pulmonary/Chest: Effort normal and breath sounds normal.  Lymphadenopathy:    She has no cervical adenopathy.  Skin: Skin is warm and dry.  Psychiatric: She has a normal mood and affect. Her behavior is normal.          Assessment & Plan:  1. Pain in a tooth or teeth 2. Tooth infection Rest, Adequate fluid intake, complete antibiotic therapy.  Patient will take Percocet for pain, that she has previously been prescribed. - amoxicillin (AMOXIL) 500 MG capsule; Take 1 capsule  (500 mg total) by mouth 3 (three) times daily.  Dispense: 21 capsule; Refill: 0 Patient to keep follow up appointment with Dr. Judi Cong.  Call clinic if symptoms worsen or not resolved,

## 2014-04-18 ENCOUNTER — Telehealth: Payer: Self-pay | Admitting: Internal Medicine

## 2014-04-18 ENCOUNTER — Telehealth: Payer: Self-pay | Admitting: Cardiovascular Disease

## 2014-04-18 DIAGNOSIS — K0889 Other specified disorders of teeth and supporting structures: Secondary | ICD-10-CM | POA: Insufficient documentation

## 2014-04-18 NOTE — Assessment & Plan Note (Signed)
stable °

## 2014-04-18 NOTE — Assessment & Plan Note (Signed)
successful HAWK 1 directional atherectomy right SFA chronic total occlusion with drug eluting balloon angioplasty with an excellent angiographic result.  On 03/21/14 .  ABIs improved. Now that her knee effusion is improving she can see improvement in claudication.

## 2014-04-18 NOTE — Telephone Encounter (Signed)
Returned call to patient she stated she changed her mind she is not going to have teeth pulled.Stated she was going to [facility] but line was too long.Stated she will not take anymore antibiotics.Advised to restart plavix as usual.

## 2014-04-18 NOTE — Assessment & Plan Note (Signed)
Patient was instructed not to stop Plavix to have anything done to her to use until mid September Due recent procedure in her right SFA  she should only hold Plavix in mid-September for 5 days for tooth extraction.

## 2014-04-18 NOTE — Telephone Encounter (Signed)
Pt states she didn't get her teeth pulled as planned.  She said she took 4 pills already and has 17 left.  She wants know if she can start back taking the blood thinners and if she can start the anti-biotics back when she has an appointment to get her teeth pulled??

## 2014-04-18 NOTE — Assessment & Plan Note (Signed)
improved

## 2014-04-18 NOTE — Telephone Encounter (Signed)
Calling because she want to know if she should stop her antibiotic and get back on her blood thinner .Marland Kitchen She did not get her teeth pulled . Please call   Thanks

## 2014-04-19 NOTE — Telephone Encounter (Signed)
See below

## 2014-04-19 NOTE — Telephone Encounter (Signed)
Pt saw Steva Colder, are you able to advise on the below question since she isn't here??

## 2014-04-19 NOTE — Telephone Encounter (Signed)
Ok to restart plavix  Plavix should be held for 5 days before the teeth extraction date is re-scheduled  OK to to finish the antibiotic though, as it was prescribed for a current infection

## 2014-04-19 NOTE — Telephone Encounter (Signed)
Patient informed of MD instructions. The patient did verbalize understanding.

## 2014-04-22 ENCOUNTER — Telehealth: Payer: Self-pay | Admitting: Cardiovascular Disease

## 2014-04-22 NOTE — Telephone Encounter (Signed)
Spoke with pt, her son noticed yesterday a bruise on the back of her left arm. The only other bruising she has is in the abdomen where she recently had a shot, but it is going away. Reassurance given to the patient, she will cont to monitor and let us know if she has any other problems. Pt agreed with this plan.

## 2014-04-22 NOTE — Telephone Encounter (Signed)
Pt is on Plavix,saw a bruise on the back of her left arm yesterday. Please call, very concerned.

## 2014-04-30 ENCOUNTER — Encounter: Payer: Self-pay | Admitting: Gastroenterology

## 2014-04-30 ENCOUNTER — Ambulatory Visit (INDEPENDENT_AMBULATORY_CARE_PROVIDER_SITE_OTHER): Payer: Medicare Other | Admitting: Gastroenterology

## 2014-04-30 ENCOUNTER — Telehealth: Payer: Self-pay | Admitting: Cardiology

## 2014-04-30 VITALS — BP 130/76 | HR 84 | Ht 60.75 in | Wt 130.1 lb

## 2014-04-30 DIAGNOSIS — I2510 Atherosclerotic heart disease of native coronary artery without angina pectoris: Secondary | ICD-10-CM | POA: Diagnosis not present

## 2014-04-30 DIAGNOSIS — R110 Nausea: Secondary | ICD-10-CM | POA: Diagnosis not present

## 2014-04-30 DIAGNOSIS — K219 Gastro-esophageal reflux disease without esophagitis: Secondary | ICD-10-CM

## 2014-04-30 DIAGNOSIS — E876 Hypokalemia: Secondary | ICD-10-CM

## 2014-04-30 MED ORDER — PRAVASTATIN SODIUM 20 MG PO TABS: 20.0000 mg | ORAL_TABLET | Freq: Every evening | ORAL | Status: DC

## 2014-04-30 MED ORDER — CO Q 10 100 MG PO CAPS: ORAL_CAPSULE | ORAL | Status: DC

## 2014-04-30 NOTE — Progress Notes (Signed)
Review of pertinent gastrointestinal problems:  1. routine risk for colon cancer, colonoscopy February 2009 , Dr. Ardis Hughs, left-sided diverticulosis without any polyps. Recall colonoscopy at 10 year interval 2. Mild gastritis, HH on EGD 01/2013 Ardis Hughs, done for thickened distal esophagus on CT. h pylori negative.  HPI: This is a   very pleasant 68 year old woman whom I last saw about a year ago.  i last saw her about a year ago.  Right leg claudication, underwent peripheral angiogram in July 2015.  Every once in a while she has nausea.  Also has constipation at times.    Nausea about daily.  Especially at night.  Will eat a hard candy wintergreen. Was having pyrosis and tried pantoprazole.  Switched to H2 blocker.  Weight increasing.  No more dysphagia.    Review of systems: Pertinent positive and negative review of systems were noted in the above HPI section. Complete review of systems was performed and was otherwise normal.    Past Medical History  Diagnosis Date  . Coronary artery disease   . Hyperlipidemia   . History of rheumatic fever   . Anemia   . OSTEOPENIA   . Insomnia   . COLONIC POLYPS, HX OF   . Hypothyroidism   . Hypertension   . Gastroesophageal reflux disease   . PVD (peripheral vascular disease) 10/31/2011  . Vitamin D deficiency   . RSD (reflex sympathetic dystrophy) 10/31/2011  . PAD (peripheral artery disease) 12/03/2011  . Heart murmur     as a child  . COPD (chronic obstructive pulmonary disease)     no per pt on 03/21/2014  . Osteoarthritis   . DISC DISEASE, CERVICAL   . Arthritis     "my whole body"  . Anxiety     hx    Past Surgical History  Procedure Laterality Date  . Abdominal hysterectomy  1977    "partial"  . Thyroidectomy  1995  . Tonsillectomy    . Carpal tunnel release Right 2005  . Cataract extraction Left 2013  . Balloon angioplasty, artery Right 03/21/2014    SFA  . Dilation and curettage of uterus  1978  . Coronary artery  bypass graft  10/2009    LIMA to the LAD, saphenous vein graft to the acute marginal, saphenous vein graft to the diagonal and obtuse marginal.    Current Outpatient Prescriptions  Medication Sig Dispense Refill  . amLODipine (NORVASC) 10 MG tablet Take 10 mg by mouth at bedtime.       Marland Kitchen amoxicillin (AMOXIL) 500 MG capsule Take 1 capsule (500 mg total) by mouth 3 (three) times daily.  21 capsule  0  . aspirin EC 81 MG EC tablet Take 1 tablet (81 mg total) by mouth daily.      . Aspirin-Salicylamide-Caffeine (BC HEADACHE POWDER PO) Take 1 packet by mouth 3 (three) times daily as needed (headache).      . Calcium Carb-Cholecalciferol (CALCIUM 1000 + D PO) Take 1 tablet by mouth daily at 2 PM daily at 2 PM.       . clopidogrel (PLAVIX) 75 MG tablet Take 1 tablet (75 mg total) by mouth daily with breakfast.  30 tablet  11  . hydrochlorothiazide (HYDRODIURIL) 25 MG tablet Take 25 mg by mouth daily after lunch.       . levothyroxine (SYNTHROID, LEVOTHROID) 112 MCG tablet Take 112 mcg by mouth daily before breakfast.      . metoprolol tartrate (LOPRESSOR) 25 MG tablet Take 25 mg  by mouth 2 (two) times daily. Take 1 tablet (25 mg) daily at 11am and 3pm      . Multiple Vitamin (MULTIVITAMIN WITH MINERALS) TABS tablet Take 1 tablet by mouth daily. Centrum      . PRENATAL VITAMINS PO Take 1 capsule by mouth daily with supper.       . ramipril (ALTACE) 10 MG capsule Take 1 capsule (10 mg total) by mouth daily.  90 capsule  3  . Vitamin D, Ergocalciferol, (DRISDOL) 50000 UNITS CAPS Take 50,000 Units by mouth every 7 (seven) days. On Tuesdays       No current facility-administered medications for this visit.    Allergies as of 04/30/2014 - Review Complete 04/30/2014  Allergen Reaction Noted  . Lipitor [atorvastatin] Other (See Comments) 04/30/2014    Family History  Problem Relation Age of Onset  . Arthritis Maternal Aunt   . Heart disease Maternal Aunt   . Hypertension Maternal Aunt   . Diabetes  Maternal Aunt   . Colon cancer Neg Hx   . Kidney disease Neg Hx   . Liver disease Neg Hx   . Throat cancer Neg Hx   . Stomach cancer Neg Hx     History   Social History  . Marital Status: Single    Spouse Name: N/A    Number of Children: 2  . Years of Education: N/A   Occupational History  . [employer]    Social History Main Topics  . Smoking status: Former Smoker -- 0.80 packs/day for 45 years    Types: Cigarettes    Quit date: 11/05/2010  . Smokeless tobacco: Never Used  . Alcohol Use: No  . Drug Use: No  . Sexual Activity: Not Currently   Other Topics Concern  . Not on file   Social History Narrative  . No narrative on file       Physical Exam: BP 130/76  Pulse 84  Ht 5' 0.75" (1.543 m)  Wt 130 lb 2 oz (59.024 kg)  BMI 24.79 kg/m2 Constitutional: generally well-appearing Psychiatric: alert and oriented x3 Eyes: extraocular movements intact Mouth: oral pharynx moist, no lesions Neck: supple no lymphadenopathy Cardiovascular: heart regular rate and rhythm Lungs: clear to auscultation bilaterally Abdomen: soft, nontender, nondistended, no obvious ascites, no peritoneal signs, normal bowel sounds Extremities: no lower extremity edema bilaterally Skin: no lesions on visible extremities    Assessment and plan: 68 y.o. female with  mild nausea, evening time, likely GERD related  I recommended she continue taking her H2 blocker but that she should start taking it at bedtime every night. This is usually very good overnight acid suppression and if GERD is contributing to her evening time nausea it should help. She will call to report on her symptoms in 4 weeks.

## 2014-04-30 NOTE — Telephone Encounter (Signed)
Pt had to stop taking Lipitor on last Thursday. It made all her joints hurt and a bad headache. What does she need to take now?

## 2014-04-30 NOTE — Patient Instructions (Signed)
Please continue zantac 150mg , it should be be immediately before bedtime every night. Call in 3-4 weeks to update on your symptoms.

## 2014-04-30 NOTE — Telephone Encounter (Signed)
Try Pravachol 20 mg along with Q10 200 mg

## 2014-04-30 NOTE — Addendum Note (Signed)
Addended by: Golden Hurter D on: 04/30/2014 05:12 PM   Modules accepted: Orders

## 2014-04-30 NOTE — Telephone Encounter (Signed)
Returned call to patient she stated she cannot take lipitor.Stated causes joint pain and headache.Advised to stop.Message sent to [facility] for advice.

## 2014-04-30 NOTE — Telephone Encounter (Signed)
Returned call to patient Dr.Berry advised to take Pravachol 20 mg daily and Co Q 10 200 mg daily.Advised to call back if needed.

## 2014-05-07 ENCOUNTER — Telehealth: Payer: Self-pay | Admitting: Cardiovascular Disease

## 2014-05-07 DIAGNOSIS — H2510 Age-related nuclear cataract, unspecified eye: Secondary | ICD-10-CM | POA: Diagnosis not present

## 2014-05-07 DIAGNOSIS — H18419 Arcus senilis, unspecified eye: Secondary | ICD-10-CM | POA: Diagnosis not present

## 2014-05-07 DIAGNOSIS — H11159 Pinguecula, unspecified eye: Secondary | ICD-10-CM | POA: Diagnosis not present

## 2014-05-07 DIAGNOSIS — H04129 Dry eye syndrome of unspecified lacrimal gland: Secondary | ICD-10-CM | POA: Diagnosis not present

## 2014-05-07 DIAGNOSIS — H40009 Preglaucoma, unspecified, unspecified eye: Secondary | ICD-10-CM | POA: Diagnosis not present

## 2014-05-07 DIAGNOSIS — H01009 Unspecified blepharitis unspecified eye, unspecified eyelid: Secondary | ICD-10-CM | POA: Diagnosis not present

## 2014-05-07 DIAGNOSIS — Z961 Presence of intraocular lens: Secondary | ICD-10-CM | POA: Diagnosis not present

## 2014-05-07 NOTE — Telephone Encounter (Signed)
Returned call to patient she stated she is having a lot of pain in right knee.Stated right knee pops when she walks.Advised ok to take tylenol arthritis.Stated she had a knee Dr.Advised to see him and let him know knee is worse.

## 2014-05-07 NOTE — Telephone Encounter (Signed)
Pt wants to know if she can take Tylenol Arthritis medicine?

## 2014-05-14 ENCOUNTER — Telehealth: Payer: Self-pay | Admitting: Cardiovascular Disease

## 2014-05-14 NOTE — Telephone Encounter (Signed)
Patient reports that for past 2-3 nights she has had nosebleeds. She reports first night she woke up in middle of night, wiped nose and noticed blood. She reports the bleeding eased off eventually. She reports next night she noticed when she blew her nose that there were "clots" and also in her throat when she cleared her throat. She reports the bleeding is always from her left nostril and she has had a slight headache. She denies any cardiac symptoms. She has taken plavix for a while, only new med addition is Co-Q10 200mg   (added on 9/8 with change in statin to attempt to alleviate statin SE).   Patient is to have labs towards end of next week, prior to OV on 10/6 with Dr. Gwenlyn Found.   Informed patient that a provider will be notified of her complaints and she will be notified with advice.

## 2014-05-14 NOTE — Telephone Encounter (Signed)
[name] called in stating that she has been having some bad nose bleeds in the past couple of days and was not sure if her blood thinner(clopidegril) was contributing to that. Please call  Thanks

## 2014-05-14 NOTE — Telephone Encounter (Signed)
Returned call to patient. Informed her that no med changes are to be made at this time, but suggested she try Afrin nasal spray (onset of nose bleed, then 12 hours later - no longer than 3 days, per Erasmo Downer, PharmD). Patient voiced understanding and will call back if nosebleeds get worse or increase in frequency

## 2014-05-14 NOTE — Telephone Encounter (Signed)
Will address this at her next office visit in October

## 2014-05-15 ENCOUNTER — Ambulatory Visit (INDEPENDENT_AMBULATORY_CARE_PROVIDER_SITE_OTHER): Payer: Medicare Other

## 2014-05-15 DIAGNOSIS — Z23 Encounter for immunization: Secondary | ICD-10-CM | POA: Diagnosis not present

## 2014-05-21 DIAGNOSIS — Z79899 Other long term (current) drug therapy: Secondary | ICD-10-CM | POA: Diagnosis not present

## 2014-05-21 DIAGNOSIS — E782 Mixed hyperlipidemia: Secondary | ICD-10-CM | POA: Diagnosis not present

## 2014-05-21 LAB — LIPID PANEL
Cholesterol: 141 mg/dL (ref 0–200)
HDL: 48 mg/dL (ref >39)
LDL Cholesterol: 70 mg/dL (ref 0–99)
Total CHOL/HDL Ratio: 2.9 Ratio
Triglycerides: 116 mg/dL (ref <150)
VLDL: 23 mg/dL (ref 0–40)

## 2014-05-21 LAB — COMPLETE METABOLIC PANEL WITH GFR
ALT: 18 U/L (ref 0–35)
AST: 17 U/L (ref 0–37)
Albumin: 4 g/dL (ref 3.5–5.2)
Alkaline Phosphatase: 82 U/L (ref 39–117)
BUN: 13 mg/dL (ref 6–23)
CO2: 24 mEq/L (ref 19–32)
Calcium: 9.3 mg/dL (ref 8.4–10.5)
Chloride: 106 mEq/L (ref 96–112)
Creat: 0.58 mg/dL (ref 0.50–1.10)
GFR, Est African American: >89 mL/min
GFR, Est Non African American: >89 mL/min
Glucose, Bld: 110 mg/dL — ABNORMAL HIGH (ref 70–99)
Potassium: 3.8 mEq/L (ref 3.5–5.3)
Sodium: 142 mEq/L (ref 135–145)
Total Bilirubin: 0.5 mg/dL (ref 0.2–1.2)
Total Protein: 6.5 g/dL (ref 6.0–8.3)

## 2014-05-28 ENCOUNTER — Telehealth: Payer: Self-pay | Admitting: *Deleted

## 2014-05-28 ENCOUNTER — Ambulatory Visit: Payer: Medicare Other | Admitting: Cardiovascular Disease

## 2014-05-28 DIAGNOSIS — M1711 Unilateral primary osteoarthritis, right knee: Secondary | ICD-10-CM | POA: Diagnosis not present

## 2014-05-28 NOTE — Telephone Encounter (Signed)
Ok for knee surgery; can DC plavix prior to surgery; continue ASA. Kirk Ruths

## 2014-05-28 NOTE — Telephone Encounter (Signed)
This note will be faxed to [facility] orth[facility] @ [phone]

## 2014-05-28 NOTE — Telephone Encounter (Signed)
Dr Stanford Breed, please advise

## 2014-05-28 NOTE — Telephone Encounter (Signed)
[name] is a cardiology patient of Dr. Jacalyn Lefevre. I see her for peripheral vascular care. Please refer to Dr. Stanford Breed for cardiovascular clearance for her knee operation.

## 2014-05-28 NOTE — Telephone Encounter (Signed)
Will defer to Dr Gwenlyn Found for cardiac clearance

## 2014-05-29 ENCOUNTER — Ambulatory Visit (INDEPENDENT_AMBULATORY_CARE_PROVIDER_SITE_OTHER): Payer: Medicare Other | Admitting: Cardiovascular Disease

## 2014-05-29 ENCOUNTER — Encounter: Payer: Self-pay | Admitting: Cardiovascular Disease

## 2014-05-29 VITALS — BP 132/70 | HR 85 | Ht 63.0 in | Wt 133.7 lb

## 2014-05-29 DIAGNOSIS — I2510 Atherosclerotic heart disease of native coronary artery without angina pectoris: Secondary | ICD-10-CM

## 2014-05-29 DIAGNOSIS — I739 Peripheral vascular disease, unspecified: Secondary | ICD-10-CM

## 2014-05-29 NOTE — Patient Instructions (Signed)
Dr Gwenlyn Found has recommended you follow-up with him as needed.

## 2014-05-29 NOTE — Assessment & Plan Note (Signed)
[name] presents today for post procedure followup. She is a 69 year old mildly overweight African American female who had claudication in her right leg with Dopplers revealed an occluded right SFA and an ABI of 0.43. I. Intermittent her 70/30/15 revealing moderately long segment occlusion in the mid right SFA which I atherectomized using the turbohawk  device followed by drug-eluting balloon angioplasty. Her post procedure Dopplers performed 04/04/14 revealed an increase in her right ABI 0.7 a widely patent SFA. Her symptoms of claudication have resolved. Her leg is warm and she feels clinically improved. She is on dual antiplatelet therapy. She is moderate disease in her left SFA but it is not symptomatic at this time. We'll continue to monitor by duplex ultrasound on a semiannual basis and I will see her back as needed.

## 2014-05-29 NOTE — Progress Notes (Signed)
05/29/2014 [name]   10/28/1945  [id number]  Primary Physician [name], MD Primary Cardiologist: Lorretta Harp MD Renae Gloss   HPI:  [name] is a 68 year old African American female patient of Dr. Jacalyn Lefevre referred for peripheral vascular evaluation. She was seen by Dr. Gladstone Lighter  orthopedic surgeon, who referred her here. Her past history is remarkable for ischemic heart disease status post coronary bypass grafting in March 2012 of the LIMA to LAD, vein to the diagonal branch, obtuse marginal branch and a vein to acute marginal branch. She has normal LV function by 2-D echo. Further problems include hypertension, and hyperlipidemia. She complains of right lower extremity claudication and had seen Dr. Fletcher Anon in the past who thought that her pain was arthritic in nature. Lower extremity arterial Doppler studies performed 07/07/14 revealed a right ABI 0.43 with an occluded distal right SFA and popliteal artery, left ABI 0.73 with a high-frequency signal in the mid left SFA. I performed angiography on her 753/15 revealing an occluded left SFA with one vessel runoff via the peroneal. I performed TurboHawk directional atherectomy followed by PTA using drug-eluting balloon with excellent angiographic result. Her followup Doppler revealed an increase in her right ABI from 0.4 to .7 with resolution of her claudication symptoms.    Current Outpatient Prescriptions  Medication Sig Dispense Refill  . amLODipine (NORVASC) 10 MG tablet Take 10 mg by mouth at bedtime.       Marland Kitchen amoxicillin (AMOXIL) 500 MG capsule Take 1 capsule (500 mg total) by mouth 3 (three) times daily.  21 capsule  0  . aspirin EC 81 MG EC tablet Take 1 tablet (81 mg total) by mouth daily.      . Aspirin-Salicylamide-Caffeine (BC HEADACHE POWDER PO) Take 1 packet by mouth 3 (three) times daily as needed (headache).      . Calcium Carb-Cholecalciferol (CALCIUM 1000 + D PO) Take 1 tablet by mouth daily at 2 PM  daily at 2 PM.       . clopidogrel (PLAVIX) 75 MG tablet Take 1 tablet (75 mg total) by mouth daily with breakfast.  30 tablet  11  . hydrochlorothiazide (HYDRODIURIL) 25 MG tablet Take 25 mg by mouth daily after lunch.       . levothyroxine (SYNTHROID, LEVOTHROID) 112 MCG tablet Take 112 mcg by mouth daily before breakfast.      . metoprolol tartrate (LOPRESSOR) 25 MG tablet Take 25 mg by mouth 2 (two) times daily. Take 1 tablet (25 mg) daily at 11am and 3pm      . Multiple Vitamin (MULTIVITAMIN WITH MINERALS) TABS tablet Take 1 tablet by mouth daily. Centrum      . pravastatin (PRAVACHOL) 20 MG tablet Take 1 tablet (20 mg total) by mouth every evening.  30 tablet  6  . PRENATAL VITAMINS PO Take 1 capsule by mouth daily with supper.       . ramipril (ALTACE) 10 MG capsule Take 1 capsule (10 mg total) by mouth daily.  90 capsule  3  . Vitamin D, Ergocalciferol, (DRISDOL) 50000 UNITS CAPS Take 50,000 Units by mouth every 7 (seven) days. On Tuesdays       No current facility-administered medications for this visit.    Allergies  Allergen Reactions  . Lipitor [Atorvastatin] Other (See Comments)    Muscle aches    History   Social History  . Marital Status: Single    Spouse Name: N/A    Number of Children: 2  .  Years of Education: N/A   Occupational History  . [employer]    Social History Main Topics  . Smoking status: Former Smoker -- 0.80 packs/day for 45 years    Types: Cigarettes    Quit date: 11/05/2010  . Smokeless tobacco: Never Used  . Alcohol Use: No  . Drug Use: No  . Sexual Activity: Not Currently   Other Topics Concern  . Not on file   Social History Narrative  . No narrative on file     Review of Systems: General: negative for chills, fever, night sweats or weight changes.  Cardiovascular: negative for chest pain, dyspnea on exertion, edema, orthopnea, palpitations, paroxysmal nocturnal dyspnea or shortness of breath Dermatological: negative for  rash Respiratory: negative for cough or wheezing Urologic: negative for hematuria Abdominal: negative for nausea, vomiting, diarrhea, bright red blood per rectum, melena, or hematemesis Neurologic: negative for visual changes, syncope, or dizziness All other systems reviewed and are otherwise negative except as noted above.    Blood pressure 132/70, pulse 85, height 5\' 3"  (1.6 m), weight 133 lb 11.2 oz (60.646 kg).  General appearance: alert and no distress Neck: no adenopathy, no carotid bruit, no JVD, supple, symmetrical, trachea midline and thyroid not enlarged, symmetric, no tenderness/mass/nodules Lungs: clear to auscultation bilaterally Heart: regular rate and rhythm, S1, S2 normal, no murmur, click, rub or gallop Extremities: extremities normal, atraumatic, no cyanosis or edema and diminished right pedal pulse  EKG normal sinus rhythm 85 without ST or T wave changes ASSESSMENT AND PLAN:   PAD (peripheral artery disease) [name] presents today for post procedure followup. She is a 68 year old mildly overweight African American female who had claudication in her right leg with Dopplers revealed an occluded right SFA and an ABI of 0.43. I. Intermittent her 70/30/15 revealing moderately long segment occlusion in the mid right SFA which I atherectomized using the turbohawk  device followed by drug-eluting balloon angioplasty. Her post procedure Dopplers performed 04/04/14 revealed an increase in her right ABI 0.7 a widely patent SFA. Her symptoms of claudication have resolved. Her leg is warm and she feels clinically improved. She is on dual antiplatelet therapy. She is moderate disease in her left SFA but it is not symptomatic at this time. We'll continue to monitor by duplex ultrasound on a semiannual basis and I will see her back as needed.      Lorretta Harp MD FACP,FACC,FAHA, [facility] 05/29/2014 10:28 AM

## 2014-05-30 ENCOUNTER — Other Ambulatory Visit: Payer: Self-pay | Admitting: Surgical

## 2014-05-30 MED ORDER — DEXAMETHASONE SODIUM PHOSPHATE 10 MG/ML IJ SOLN
10.0000 mg | Freq: Once | INTRAMUSCULAR | Status: AC
  Administered 2014-06-18: 10 mg via INTRAVENOUS

## 2014-06-04 DIAGNOSIS — M1711 Unilateral primary osteoarthritis, right knee: Secondary | ICD-10-CM | POA: Diagnosis not present

## 2014-06-05 ENCOUNTER — Encounter (HOSPITAL_COMMUNITY): Payer: Self-pay | Admitting: Pharmacy Technician

## 2014-06-07 ENCOUNTER — Other Ambulatory Visit (HOSPITAL_COMMUNITY): Payer: Self-pay | Admitting: *Deleted

## 2014-06-10 ENCOUNTER — Encounter: Payer: Self-pay | Admitting: Family

## 2014-06-10 ENCOUNTER — Ambulatory Visit (INDEPENDENT_AMBULATORY_CARE_PROVIDER_SITE_OTHER): Payer: Medicare Other | Admitting: Family

## 2014-06-10 ENCOUNTER — Telehealth: Payer: Self-pay | Admitting: Internal Medicine

## 2014-06-10 VITALS — BP 120/80 | HR 61 | Ht 63.0 in | Wt 134.4 lb

## 2014-06-10 DIAGNOSIS — S40022A Contusion of left upper arm, initial encounter: Secondary | ICD-10-CM

## 2014-06-10 DIAGNOSIS — I2510 Atherosclerotic heart disease of native coronary artery without angina pectoris: Secondary | ICD-10-CM

## 2014-06-10 DIAGNOSIS — R040 Epistaxis: Secondary | ICD-10-CM

## 2014-06-10 LAB — CBC WITH DIFFERENTIAL/PLATELET
Basophils Absolute: 0 10*3/uL (ref 0.0–0.1)
Basophils Relative: 0.4 % (ref 0.0–3.0)
Eosinophils Absolute: 0.2 10*3/uL (ref 0.0–0.7)
Eosinophils Relative: 3 % (ref 0.0–5.0)
HCT: 39.8 % (ref 36.0–46.0)
Hemoglobin: 12.8 g/dL (ref 12.0–15.0)
Lymphocytes Relative: 30.4 % (ref 12.0–46.0)
Lymphs Abs: 1.9 10*3/uL (ref 0.7–4.0)
MCHC: 32.2 g/dL (ref 30.0–36.0)
MCV: 85.9 fl (ref 78.0–100.0)
Monocytes Absolute: 0.5 10*3/uL (ref 0.1–1.0)
Monocytes Relative: 7.8 % (ref 3.0–12.0)
Neutro Abs: 3.7 10*3/uL (ref 1.4–7.7)
Neutrophils Relative %: 58.4 % (ref 43.0–77.0)
Platelets: 258 10*3/uL (ref 150.0–400.0)
RBC: 4.63 Mil/uL (ref 3.87–5.11)
RDW: 15.3 % (ref 11.5–15.5)
WBC: 6.3 10*3/uL (ref 4.0–10.5)

## 2014-06-10 NOTE — Progress Notes (Signed)
Pre visit review using our clinic review tool, if applicable. No additional management support is needed unless otherwise documented below in the visit note. 

## 2014-06-10 NOTE — Patient Instructions (Signed)
1. Nasal saline use as needed for nasal dryness.   Nosebleed Nosebleeds can be caused by many conditions, including trauma, infections, polyps, foreign bodies, dry mucous membranes or climate, medicines, and air conditioning. Most nosebleeds occur in the front of the nose. Because of this location, most nosebleeds can be controlled by pinching the nostrils gently and continuously for at least 10 to 20 minutes. The long, continuous pressure allows enough time for the blood to clot. If pressure is released during that 10 to 20 minute time period, the process may have to be started again. The nosebleed may stop by itself or quit with pressure, or it may need concentrated heating (cautery) or pressure from packing. HOME CARE INSTRUCTIONS   If your nose was packed, try to maintain the pack inside until your health care provider removes it. If a gauze pack was used and it starts to fall out, gently replace it or cut the end off. Do not cut if a balloon catheter was used to pack the nose. Otherwise, do not remove unless instructed.  Avoid blowing your nose for 12 hours after treatment. This could dislodge the pack or clot and start the bleeding again.  If the bleeding starts again, sit up and bend forward, gently pinching the front half of your nose continuously for 20 minutes.  If bleeding was caused by dry mucous membranes, use over-the-counter saline nasal spray or gel. This will keep the mucous membranes moist and allow them to heal. If you must use a lubricant, choose the water-soluble variety. Use it only sparingly and not within several hours of lying down.  Do not use petroleum jelly or mineral oil, as these may drip into the lungs and cause serious problems.  Maintain humidity in your home by using less air conditioning or by using a humidifier.  Do not use aspirin or medicines which make bleeding more likely. Your health care provider can give you recommendations on this.  Resume normal  activities as you are able, but try to avoid straining, lifting, or bending at the waist for several days.  If the nosebleeds become recurrent and the cause is unknown, your health care provider may suggest laboratory tests. SEEK MEDICAL CARE IF: You have a fever. SEEK IMMEDIATE MEDICAL CARE IF:   Bleeding recurs and cannot be controlled.  There is unusual bleeding from or bruising on other parts of the body.  Nosebleeds continue.  There is any worsening of the condition which originally brought you in.  You become light-headed, feel faint, become sweaty, or vomit blood. MAKE SURE YOU:   Understand these instructions.  Will watch your condition.  Will get help right away if you are not doing well or get worse. Document Released: 05/19/2005 Document Revised: 12/24/2013 Document Reviewed: 07/10/2009 [facility] Patient Information 2015 [place], [place]. This information is not intended to replace advice given to you by your health care provider. Make sure you discuss any questions you have with your health care provider.

## 2014-06-10 NOTE — Progress Notes (Signed)
Subjective:    Patient ID: [name], female    DOB: 1946-04-12, 68 y.o.   MRN: [id number]   Epistaxis    68 year old [place] American female, nonsmoker with a history of CAD on Plavix is in today with complaints of nosebleed that started this morning. He reports blowing her nose and coughing up clots of blood. Has had nasal congestion and a cough but denies any sneezing. Has not taken any medication over-the-counter. Reports turning her heat on this past weekend. He also has concerns of her bruise on her left arm and she is unsure of how it got there. Nontender to touch.   Review of Systems  Constitutional: Negative.   HENT: Positive for congestion and nosebleeds. Negative for sore throat.   Respiratory: Negative.   Cardiovascular: Negative.   Gastrointestinal: Negative.   Endocrine: Negative.   Genitourinary: Negative.   Musculoskeletal: Negative.   Skin: Negative.   Allergic/Immunologic: Negative.   Neurological: Negative.   Hematological: Bruises/bleeds easily.  Psychiatric/Behavioral: Negative.    Past Medical History  Diagnosis Date  . Coronary artery disease   . Hyperlipidemia   . History of rheumatic fever   . Anemia   . OSTEOPENIA   . Insomnia   . COLONIC POLYPS, HX OF   . Hypothyroidism   . Hypertension   . Gastroesophageal reflux disease   . PVD (peripheral vascular disease) 10/31/2011  . Vitamin D deficiency   . RSD (reflex sympathetic dystrophy) 10/31/2011  . PAD (peripheral artery disease) 12/03/2011    status post right SFA Turbo Hawk atherectomy  . Heart murmur     as a child  . COPD (chronic obstructive pulmonary disease)     no per pt on 03/21/2014  . Osteoarthritis   . DISC DISEASE, CERVICAL   . Arthritis     "my whole body"  . Anxiety     hx    History   Social History  . Marital Status: Single    Spouse Name: N/A    Number of Children: 2  . Years of Education: N/A   Occupational History  . [employer]    Social History Main  Topics  . Smoking status: Former Smoker -- 0.80 packs/day for 45 years    Types: Cigarettes    Quit date: 11/05/2010  . Smokeless tobacco: Never Used  . Alcohol Use: No  . Drug Use: No  . Sexual Activity: Not Currently   Other Topics Concern  . Not on file   Social History Narrative  . No narrative on file    Past Surgical History  Procedure Laterality Date  . Abdominal hysterectomy  1977    "partial"  . Thyroidectomy  1995  . Tonsillectomy    . Carpal tunnel release Right 2005  . Cataract extraction Left 2013  . Balloon angioplasty, artery Right 03/21/2014    SFA  . Dilation and curettage of uterus  1978  . Coronary artery bypass graft  10/2009    LIMA to the LAD, saphenous vein graft to the acute marginal, saphenous vein graft to the diagonal and obtuse marginal.    Family History  Problem Relation Age of Onset  . Arthritis Maternal Aunt   . Heart disease Maternal Aunt   . Hypertension Maternal Aunt   . Diabetes Maternal Aunt   . Colon cancer Neg Hx   . Kidney disease Neg Hx   . Liver disease Neg Hx   . Throat cancer Neg Hx   . Stomach cancer  Neg Hx     Allergies  Allergen Reactions  . Lipitor [Atorvastatin] Other (See Comments)    Muscle aches    Current Outpatient Prescriptions on File Prior to Visit  Medication Sig Dispense Refill  . amLODipine (NORVASC) 10 MG tablet Take 10 mg by mouth at bedtime.       Marland Kitchen aspirin EC 81 MG tablet Take 81 mg by mouth every morning.      . Aspirin-Salicylamide-Caffeine (BC HEADACHE POWDER PO) Take 1 packet by mouth 3 (three) times daily as needed (headache).      . Calcium Carb-Cholecalciferol (CALCIUM 1000 + D PO) Take 1 tablet by mouth daily at 2 PM daily at 2 PM.       . clopidogrel (PLAVIX) 75 MG tablet Take 1 tablet (75 mg total) by mouth daily with breakfast.  30 tablet  11  . COD LIVER OIL PO Take 1 tablet by mouth every morning.      . hydrochlorothiazide (HYDRODIURIL) 25 MG tablet Take 25 mg by mouth daily after  lunch.       . levothyroxine (SYNTHROID, LEVOTHROID) 112 MCG tablet Take 112 mcg by mouth daily before breakfast.      . Multiple Vitamin (MULTIVITAMIN WITH MINERALS) TABS tablet Take 1 tablet by mouth every morning. Centrum      . Omega-3 Fatty Acids (FISH OIL PO) Take 1 tablet by mouth every morning.      Marland Kitchen oxymetazoline (AFRIN) 0.05 % nasal spray Place 1-2 sprays into both nostrils daily as needed for congestion.      . pravastatin (PRAVACHOL) 20 MG tablet Take 20 mg by mouth every morning.      Marland Kitchen PRENATAL VITAMINS PO Take 1 capsule by mouth every morning.       . Vitamin D, Ergocalciferol, (DRISDOL) 50000 UNITS CAPS Take 50,000 Units by mouth every 7 (seven) days. On Tuesdays       Current Facility-Administered Medications on File Prior to Visit  Medication Dose Route Frequency Provider Last Rate Last Dose  . dexamethasone (DECADRON) injection 10 mg  10 mg Intravenous Once Amber Lauren Constable, PA-C        BP 120/80  Pulse 61  Ht 5\' 3"  (1.6 m)  Wt 134 lb 6.4 oz (60.963 kg)  BMI 23.81 kg/m2chart    Objective:   Physical Exam  Constitutional: She appears well-developed and well-nourished.  HENT:  Right Ear: External ear normal.  Left Ear: External ear normal.  Nose: Nose normal.  Dry mucous membranes  Neck: Normal range of motion. Neck supple.  Cardiovascular: Normal rate, regular rhythm and normal heart sounds.   Pulmonary/Chest: Effort normal and breath sounds normal.  Abdominal: Soft. Bowel sounds are normal.  Musculoskeletal: Normal range of motion.  Neurological: She is alert.  Skin: Skin is warm and dry.  Psychiatric: She has a normal mood and affect.         PT/INR: 1.0  Assessment & Plan:  Delbert was seen today for epistaxis.  Diagnoses and associated orders for this visit:  Epistaxis - CBC with Differential  Superficial bruising of arm, left, initial encounter    Nasal saline wash her eyes to mucous membranes. CBC obtained to assess the bruising.  However, encourage patient that this is likely an adverse effect of being on Plavix. She continues to be concerned discussed it with her PCP.

## 2014-06-10 NOTE — Telephone Encounter (Signed)
Patient Information:  Caller Name: Jazara  Phone: [phone]  Patient: [name], [name]  Gender: Female  DOB: [birth year]  Age: 68 Years  PCP: [name] (Adults only)  Office Follow Up:  Does the office need to follow up with this patient?: Yes  Instructions For The Office: Patient requests earlier appointment if possible.  Please follow up.  Thank you.   Symptoms  Reason For Call & Symptoms: Patient reports she has nosebleed upon waking,  spit up small amounts of blood.  She is on anticoagulant therapy.  Also reports bruising on right arm.  She reports "a whole lot" - 12 tissues completely soaked.  Bleeding currently controlled/ stopped.  Emergent symptoms ruled out.  Go to Office Now per Nosebleed guideline due to Large amount of blood has been lost.  Reviewed Health History In EMR: Yes  Reviewed Medications In EMR: Yes  Reviewed Allergies In EMR: Yes  Reviewed Surgeries / Procedures: Yes  Date of Onset of Symptoms: 06/10/2014  Treatments Tried: Vaseline to nose  Treatments Tried Worked: No  Guideline(s) Used:  Nosebleed  Disposition Per Guideline:   Go to Office Now  Reason For Disposition Reached:   Large amount of blood has been lost (e.g., one cup)  Advice Given:  N/A  RN Overrode Recommendation:  Patient Already Has Appt, Document Patient  Patient has appoitnment at [facility] location for 14:15..  She prefers earlier appointment if possible.

## 2014-06-11 ENCOUNTER — Encounter (HOSPITAL_COMMUNITY)
Admission: RE | Admit: 2014-06-11 | Discharge: 2014-06-11 | Disposition: A | Discharge status: Home / Self Care | Payer: Medicare Other | Source: Ambulatory Visit | Attending: Orthopedic Surgery | Admitting: Orthopedic Surgery

## 2014-06-11 ENCOUNTER — Other Ambulatory Visit: Payer: Self-pay | Admitting: Family

## 2014-06-11 ENCOUNTER — Encounter (HOSPITAL_COMMUNITY): Payer: Self-pay

## 2014-06-11 DIAGNOSIS — M179 Osteoarthritis of knee, unspecified: Secondary | ICD-10-CM | POA: Insufficient documentation

## 2014-06-11 DIAGNOSIS — I2510 Atherosclerotic heart disease of native coronary artery without angina pectoris: Secondary | ICD-10-CM | POA: Insufficient documentation

## 2014-06-11 DIAGNOSIS — Z01818 Encounter for other preprocedural examination: Secondary | ICD-10-CM | POA: Diagnosis not present

## 2014-06-11 LAB — COMPREHENSIVE METABOLIC PANEL
ALT: 17 U/L (ref 0–35)
AST: 16 U/L (ref 0–37)
Albumin: 3.8 g/dL (ref 3.5–5.2)
Alkaline Phosphatase: 91 U/L (ref 39–117)
Anion gap: 14 (ref 5–15)
BUN: 14 mg/dL (ref 6–23)
CO2: 26 mEq/L (ref 19–32)
Calcium: 9.9 mg/dL (ref 8.4–10.5)
Chloride: 105 mEq/L (ref 96–112)
Creatinine, Ser: 0.59 mg/dL (ref 0.50–1.10)
GFR calc Af Amer: >90 mL/min (ref >90)
GFR calc non Af Amer: >90 mL/min (ref >90)
Glucose, Bld: 91 mg/dL (ref 70–99)
Potassium: 4.1 mEq/L (ref 3.7–5.3)
Sodium: 145 mEq/L (ref 137–147)
Total Bilirubin: 0.4 mg/dL (ref 0.3–1.2)
Total Protein: 7.4 g/dL (ref 6.0–8.3)

## 2014-06-11 LAB — PROTIME-INR
INR: 1 (ref 0.00–1.49)
Prothrombin Time: 13.3 seconds (ref 11.6–15.2)

## 2014-06-11 LAB — URINALYSIS, ROUTINE W REFLEX MICROSCOPIC
Bilirubin Urine: NEGATIVE
Glucose, UA: NEGATIVE mg/dL
Hgb urine dipstick: NEGATIVE
Ketones, ur: NEGATIVE mg/dL
Leukocytes, UA: NEGATIVE
Nitrite: NEGATIVE
Protein, ur: NEGATIVE mg/dL
Specific Gravity, Urine: 1.02 (ref 1.005–1.030)
Urobilinogen, UA: 1 mg/dL (ref 0.0–1.0)
pH: 6 (ref 5.0–8.0)

## 2014-06-11 LAB — CBC
HCT: 42 % (ref 36.0–46.0)
Hemoglobin: 13.7 g/dL (ref 12.0–15.0)
MCH: 28.2 pg (ref 26.0–34.0)
MCHC: 32.6 g/dL (ref 30.0–36.0)
MCV: 86.4 fL (ref 78.0–100.0)
Platelets: 256 10*3/uL (ref 150–400)
RBC: 4.86 MIL/uL (ref 3.87–5.11)
RDW: 14.2 % (ref 11.5–15.5)
WBC: 5.5 10*3/uL (ref 4.0–10.5)

## 2014-06-11 LAB — APTT: aPTT: 25 seconds (ref 24–37)

## 2014-06-11 LAB — SURGICAL PCR SCREEN
MRSA, PCR: NEGATIVE
Staphylococcus aureus: NEGATIVE

## 2014-06-11 NOTE — Progress Notes (Signed)
Patient reports nosebleed on 06/10/2014.  Was seen by PCP.  Note in [facility] 06/10/2014.  Also patient notice bruise on arm .  Instructed patient if had any more issues related to bleeding to contact PCP. Patient voiced understanding.

## 2014-06-11 NOTE — Progress Notes (Signed)
EKG- 05/29/14 EPIC  ECHO- 04/05/2011 EPIC  CXR 03/19/14 EPIC  LOV with Dr Gwenlyn Found 05/29/14 EPIC  CLearance- Dr Stanford Breed on chart

## 2014-06-11 NOTE — Patient Instructions (Signed)
SHANEICE BARSANTI  06/11/2014   Your procedure is scheduled on: 06/18/2014 :    Come thru the [place] entrance   Follow the Signs to [facility] at  1030       am  Call this number if you have problems the morning of surgery: [phone]   Remember:   Do not eat food or drink liquids after midnight.   Take these medicines the morning of surgery with A SIP OF WATER: Synthroid    Do not wear jewelry, make-up or nail polish.  Do not wear lotions, powders, or perfumes. deodorant.  Do not shave 48 hours prior to surgery.   Do not bring valuables to the hospital.  Contacts, dentures or bridgework may not be worn into surgery.  Leave suitcase in the car. After surgery it may be brought to your room.  For patients admitted to the hospital, checkout time is 11:00 AM the day of  discharge.       [facility] - Preparing for Surgery Before surgery, you can play an important role.  Because skin is not sterile, your skin needs to be as free of germs as possible.  You can reduce the number of germs on your skin by washing with CHG (chlorahexidine gluconate) soap before surgery.  CHG is an antiseptic cleaner which kills germs and bonds with the skin to continue killing germs even after washing. Please DO NOT use if you have an allergy to CHG or antibacterial soaps.  If your skin becomes reddened/irritated stop using the CHG and inform your nurse when you arrive at Short Stay. Do not shave (including legs and underarms) for at least 48 hours prior to the first CHG shower.  You may shave your face/neck. Please follow these instructions carefully:  1.  Shower with CHG Soap the night before surgery and the  morning of Surgery.  2.  If you choose to wash your hair, wash your hair first as usual with your  normal  shampoo.  3.  After you shampoo, rinse your hair and body thoroughly to remove the  shampoo.                           4.  Use CHG as you would any other liquid soap.  You can apply chg  directly  to the skin and wash                       Gently with a scrungie or clean washcloth.  5.  Apply the CHG Soap to your body ONLY FROM THE NECK DOWN.   Do not use on face/ open                           Wound or open sores. Avoid contact with eyes, ears mouth and genitals (private parts).                       Wash face,  Genitals (private parts) with your normal soap.             6.  Wash thoroughly, paying special attention to the area where your surgery  will be performed.  7.  Thoroughly rinse your body with warm water from the neck down.  8.  DO NOT shower/wash with your normal soap after using and rinsing off  the CHG Soap.  9.  Pat yourself dry with a clean towel.            10.  Wear clean pajamas.            11.  Place clean sheets on your bed the night of your first shower and do not  sleep with pets. Day of Surgery : Do not apply any lotions/deodorants the morning of surgery.  Please wear clean clothes to the hospital/surgery center.  FAILURE TO FOLLOW THESE INSTRUCTIONS MAY RESULT IN THE CANCELLATION OF YOUR SURGERY PATIENT SIGNATURE_________________________________  NURSE SIGNATURE__________________________________  ________________________________________________________________________  WHAT IS A BLOOD TRANSFUSION? Blood Transfusion Information  A transfusion is the replacement of blood or some of its parts. Blood is made up of multiple cells which provide different functions.  Red blood cells carry oxygen and are used for blood loss replacement.  White blood cells fight against infection.  Platelets control bleeding.  Plasma helps clot blood.  Other blood products are available for specialized needs, such as hemophilia or other clotting disorders. BEFORE THE TRANSFUSION  Who gives blood for transfusions?   Healthy volunteers who are fully evaluated to make sure their blood is safe. This is blood bank blood. Transfusion therapy is the safest  it has ever been in the practice of medicine. Before blood is taken from a donor, a complete history is taken to make sure that person has no history of diseases nor engages in risky social behavior (examples are intravenous drug use or sexual activity with multiple partners). The donor's travel history is screened to minimize risk of transmitting infections, such as malaria. The donated blood is tested for signs of infectious diseases, such as HIV and hepatitis. The blood is then tested to be sure it is compatible with you in order to minimize the chance of a transfusion reaction. If you or a relative donates blood, this is often done in anticipation of surgery and is not appropriate for emergency situations. It takes many days to process the donated blood. RISKS AND COMPLICATIONS Although transfusion therapy is very safe and saves many lives, the main dangers of transfusion include:   Getting an infectious disease.  Developing a transfusion reaction. This is an allergic reaction to something in the blood you were given. Every precaution is taken to prevent this. The decision to have a blood transfusion has been considered carefully by your caregiver before blood is given. Blood is not given unless the benefits outweigh the risks. AFTER THE TRANSFUSION  Right after receiving a blood transfusion, you will usually feel much better and more energetic. This is especially true if your red blood cells have gotten low (anemic). The transfusion raises the level of the red blood cells which carry oxygen, and this usually causes an energy increase.  The nurse administering the transfusion will monitor you carefully for complications. HOME CARE INSTRUCTIONS  No special instructions are needed after a transfusion. You may find your energy is better. Speak with your caregiver about any limitations on activity for underlying diseases you may have. SEEK MEDICAL CARE IF:   Your condition is not improving after  your transfusion.  You develop redness or irritation at the intravenous (IV) site. SEEK IMMEDIATE MEDICAL CARE IF:  Any of the following symptoms occur over the next 12 hours:  Shaking chills.  You have a temperature by mouth above 102 F (38.9 C), not controlled by medicine.  Chest, back, or muscle pain.  People around you feel you are not acting correctly or  are confused.  Shortness of breath or difficulty breathing.  Dizziness and fainting.  You get a rash or develop hives.  You have a decrease in urine output.  Your urine turns a dark color or changes to pink, red, or brown. Any of the following symptoms occur over the next 10 days:  You have a temperature by mouth above 102 F (38.9 C), not controlled by medicine.  Shortness of breath.  Weakness after normal activity.  The white part of the eye turns yellow (jaundice).  You have a decrease in the amount of urine or are urinating less often.  Your urine turns a dark color or changes to pink, red, or brown. Document Released: 08/06/2000 Document Revised: 11/01/2011 Document Reviewed: 03/25/2008 ExitCare Patient Information 2014 [place].  _______________________________________________________________________  Incentive Spirometer  An incentive spirometer is a tool that can help keep your lungs clear and active. This tool measures how well you are filling your lungs with each breath. Taking long deep breaths may help reverse or decrease the chance of developing breathing (pulmonary) problems (especially infection) following:  A long period of time when you are unable to move or be active. BEFORE THE PROCEDURE   If the spirometer includes an indicator to show your best effort, your nurse or respiratory therapist will set it to a desired goal.  If possible, sit up straight or lean slightly forward. Try not to slouch.  Hold the incentive spirometer in an upright position. INSTRUCTIONS FOR USE  1. Sit on  the edge of your bed if possible, or sit up as far as you can in bed or on a chair. 2. Hold the incentive spirometer in an upright position. 3. Breathe out normally. 4. Place the mouthpiece in your mouth and seal your lips tightly around it. 5. Breathe in slowly and as deeply as possible, raising the piston or the ball toward the top of the column. 6. Hold your breath for 3-5 seconds or for as long as possible. Allow the piston or ball to fall to the bottom of the column. 7. Remove the mouthpiece from your mouth and breathe out normally. 8. Rest for a few seconds and repeat Steps 1 through 7 at least 10 times every 1-2 hours when you are awake. Take your time and take a few normal breaths between deep breaths. 9. The spirometer may include an indicator to show your best effort. Use the indicator as a goal to work toward during each repetition. 10. After each set of 10 deep breaths, practice coughing to be sure your lungs are clear. If you have an incision (the cut made at the time of surgery), support your incision when coughing by placing a pillow or rolled up towels firmly against it. Once you are able to get out of bed, walk around indoors and cough well. You may stop using the incentive spirometer when instructed by your caregiver.  RISKS AND COMPLICATIONS  Take your time so you do not get dizzy or light-headed.  If you are in pain, you may need to take or ask for pain medication before doing incentive spirometry. It is harder to take a deep breath if you are having pain. AFTER USE  Rest and breathe slowly and easily.  It can be helpful to keep track of a log of your progress. Your caregiver can provide you with a simple table to help with this. If you are using the spirometer at home, follow these instructions: [place] IF:  You are having difficultly using the spirometer.  You have trouble using the spirometer as often as instructed.  Your pain medication is not giving  enough relief while using the spirometer.  You develop fever of 100.5 F (38.1 C) or higher. SEEK IMMEDIATE MEDICAL CARE IF:   You cough up bloody sputum that had not been present before.  You develop fever of 102 F (38.9 C) or greater.  You develop worsening pain at or near the incision site. MAKE SURE YOU:   Understand these instructions.  Will watch your condition.  Will get help right away if you are not doing well or get worse. Document Released: 12/20/2006 Document Revised: 11/01/2011 Document Reviewed: 02/20/2007 ExitCare Patient Information 2014 Exit[employer], [place].   ________________________________________________________________________    Please read over the following fact sheets that you were given: MRSA Information, coughing and deep breathing exercises, leg exercises

## 2014-06-12 ENCOUNTER — Other Ambulatory Visit: Payer: Self-pay | Admitting: Surgical

## 2014-06-12 MED ORDER — TRANEXAMIC ACID 100 MG/ML IV SOLN: 2000.0000 mg | Freq: Once | INTRAVENOUS | Status: AC

## 2014-06-12 NOTE — H&P (Signed)
TOTAL KNEE ADMISSION H&P  Patient is being admitted for right total knee arthroplasty.  Subjective:  Chief Complaint:right knee pain.  HPI: [name], 68 y.o. female, has a history of pain and functional disability in the right knee due to arthritis and has failed non-surgical conservative treatments for greater than 12 weeks to includeNSAID's and/or analgesics, corticosteriod injections, viscosupplementation injections, use of assistive devices and activity modification.  Onset of symptoms was gradual, starting 3 years ago with gradually worsening course since that time. The patient noted no past surgery on the right knee(s).  [name] Patient has night pain, worsening of pain with activity and weight bearing, pain that interferes with activities of daily living, pain with passive range of motion, crepitus and joint swelling.  Patient has evidence of periarticular osteophytes and joint space narrowing by imaging studies. There is no active infection.  Patient Active Problem List   Diagnosis Date Noted  . Tooth pain 04/18/2014  . Claudication 03/21/2014  . Primary localized osteoarthrosis, lower leg 08/10/2013  . Right knee pain 08/02/2013  . Acute upper respiratory infections of unspecified site 08/02/2013  . Dyspepsia 11/16/2012  . Knee pain, right 02/18/2012  . PAD (peripheral artery disease) 12/03/2011  . PVD (peripheral vascular disease) 10/31/2011  . RSD (reflex sympathetic dystrophy) 10/31/2011  . Vitamin d deficiency   . Preop exam for internal medicine 10/28/2011  . Anxiety 03/11/2011  . COPD (chronic obstructive pulmonary disease) 02/02/2011  . Anemia 02/02/2011  . History of rheumatic fever 02/02/2011  . Insomnia 02/02/2011  . CAD 09/08/2010  . CONSTIPATION 02/19/2010  . TOBACCO ABUSE 07/21/2009  . MURMUR 06/17/2009  . [place] DISEASE, CERVICAL 03/20/2009  . Pain in Soft Tissues of Limb 05/23/2008   . HYPOTHYROIDISM 09/07/2007  . HYPERLIPIDEMIA 09/07/2007  . HYPERTENSION 09/07/2007  . ALLERGIC RHINITIS 09/07/2007  . GERD 09/07/2007  . OSTEOARTHRITIS 09/07/2007  . OSTEOPENIA 09/07/2007  . COLONIC POLYPS, HX OF 09/07/2007   Past Medical History  Diagnosis Date  . Coronary artery disease   . Hyperlipidemia   . History of rheumatic fever   . Anemia   . OSTEOPENIA   . Insomnia   . COLONIC POLYPS, HX OF   . Hypothyroidism   . Hypertension   . Gastroesophageal reflux disease   . PVD (peripheral vascular disease) 10/31/2011  . Vitamin D deficiency   . RSD (reflex sympathetic dystrophy) 10/31/2011  . PAD (peripheral artery disease) 12/03/2011    status post right SFA [place] atherectomy  . COPD (chronic obstructive pulmonary disease)     no per pt on 03/21/2014  . Osteoarthritis   . DISC DISEASE, CERVICAL   . Arthritis     "my whole body"  . Heart murmur     as a child  . Anxiety     hx    Past Surgical History  Procedure Laterality Date  . Abdominal hysterectomy  1977    "partial"  . Thyroidectomy  1995  . Tonsillectomy    . Carpal tunnel release Right 2005  . Cataract extraction Left 2013  . Balloon angioplasty, artery Right 03/21/2014    SFA  . Dilation and curettage of uterus  1978  . Coronary artery bypass graft  10/2009    LIMA to the LAD, saphenous vein graft to the acute marginal, saphenous vein graft to the diagonal and obtuse marginal.  . Cardiac catheterization  2011     \  Current outpatient prescriptions: amLODipine (NORVASC) 10 MG tablet, Take 10 mg by mouth at bedtime. , Disp: , Rfl: ;   aspirin EC 81 MG tablet, Take 81 mg by mouth every morning., Disp: , Rfl: ;   Aspirin-Salicylamide-Caffeine (BC HEADACHE POWDER PO), Take 1 packet by mouth 3 (three) times daily as needed (headache)., Disp: , Rfl: ;   Calcium Carb-Cholecalciferol (CALCIUM 1000 + D PO), Take 1 tablet by mouth daily at 2 PM daily at 2 PM. , Disp: , Rfl:  clopidogrel (PLAVIX) 75 MG  tablet, Take 1 tablet (75 mg total) by mouth daily with breakfast., Disp: 30 tablet, Rfl: 11;   COD LIVER OIL PO, Take 1 tablet by mouth every morning., Disp: , Rfl: ;   hydrochlorothiazide (HYDRODIURIL) 25 MG tablet, Take 25 mg by mouth daily after lunch. , Disp: , Rfl: ;   levothyroxine (SYNTHROID, LEVOTHROID) 112 MCG tablet, Take 112 mcg by mouth daily before breakfast., Disp: , Rfl:  Multiple Vitamin (MULTIVITAMIN WITH MINERALS) TABS tablet, Take 1 tablet by mouth every morning.  Centrum, Disp: , Rfl: ;  Omega-3 Fatty Acids (FISH OIL PO), Take 1 tablet by mouth every morning., Disp: , Rfl: ;   oxymetazoline (AFRIN) 0.05 % nasal spray, Place 1-2 sprays into both nostrils daily as needed for congestion., Disp: , Rfl: ;   pravastatin (PRAVACHOL) 20 MG tablet, Take 20 mg by mouth every morning., Disp: , Rfl:  PRENATAL VITAMINS PO, Take 1 capsule by mouth every morning. , Disp: , Rfl: ;   Vitamin D, Ergocalciferol, (DRISDOL) 50000 UNITS CAPS, Take 50,000 Units by mouth every 7 (seven) days. On Tuesdays, Disp: , Rfl:    Allergies  Allergen Reactions  . Lipitor [Atorvastatin] Other (See Comments)    Muscle aches    History  Substance Use Topics  . Smoking status: Former Smoker -- 0.80 packs/day for 45 years    Types: Cigarettes    Quit date: 08/23/2009  . Smokeless tobacco: Never Used  . Alcohol Use: No    Family History  Problem Relation Age of Onset  . Arthritis Maternal Aunt   . Heart disease Maternal Aunt   . Hypertension Maternal Aunt   . Diabetes Maternal Aunt   . Colon cancer Neg Hx   . Kidney disease Neg Hx   . Liver disease Neg Hx   . Throat cancer Neg Hx   . Stomach cancer Neg Hx      Review of Systems  Constitutional: Negative.   HENT: Negative.   Eyes: Negative.   Respiratory: Positive for cough and shortness of breath. Negative for hemoptysis, sputum production and wheezing.        SOB on exertion  Cardiovascular: Negative.   Gastrointestinal: Negative.    Genitourinary: Negative.   Musculoskeletal: Positive for back pain and joint pain. Negative for falls, myalgias and neck pain.       Right knee pain  Skin: Negative.   Neurological: Negative.   Endo/Heme/Allergies: Positive for environmental allergies. Negative for polydipsia. Does not bruise/bleed easily.  Psychiatric/Behavioral: Negative.     Objective:  Physical Exam  Constitutional: She is oriented to person, place, and time. She appears well-developed and well-nourished. No distress.  HENT:  Head: Normocephalic and atraumatic.  Right Ear: External ear normal.  Left Ear: External ear normal.  Nose: Nose normal.  Mouth/Throat: Oropharynx is clear and moist.  Eyes: Conjunctivae and EOM are normal.  Neck: Normal range of motion. Neck supple.  Cardiovascular: Normal rate, regular rhythm, normal  heart sounds and intact distal pulses.   No murmur heard. Respiratory: Effort normal and breath sounds normal. No respiratory distress. She has no wheezes.  GI: Soft. Bowel sounds are normal. She exhibits no distension. There is no tenderness.  Musculoskeletal:       Right hip: Normal.       Left hip: Normal.       Right knee: She exhibits decreased range of motion and swelling. She exhibits no effusion and no erythema. Tenderness found. Medial joint line and lateral joint line tenderness noted.       Left knee: Normal.       Right lower leg: She exhibits no tenderness and no swelling.       Left lower leg: She exhibits no tenderness and no swelling.  Neurological: She is alert and oriented to person, place, and time. She has normal strength and normal reflexes. No sensory deficit.  Skin: No rash noted. She is not diaphoretic. No erythema.  Psychiatric: She has a normal mood and affect. Her behavior is normal.     Vitals Weight: 133 lb Height: 63in Body Surface Area: 1.64 m Body Mass Index: 23.56 kg/m Pulse: 80 (Regular)  BP: 144/78 (Sitting, Left Arm,  Standard)  Imaging Review Plain radiographs demonstrate severe degenerative joint disease of the right knee(s). The overall alignment ismild valgus. The bone quality appears to be good for age and reported activity level.  Assessment/Plan:  End stage arthritis, right knee   The patient history, physical examination, clinical judgment of the provider and imaging studies are consistent with end stage degenerative joint disease of the right knee(s) and total knee arthroplasty is deemed medically necessary. The treatment options including medical management, injection therapy arthroscopy and arthroplasty were discussed at length. The risks and benefits of total knee arthroplasty were presented and reviewed. The risks due to aseptic loosening, infection, stiffness, patella tracking problems, thromboembolic complications and other imponderables were discussed. The patient acknowledged the explanation, agreed to proceed with the plan and consent was signed. Patient is being admitted for inpatient treatment for surgery, pain control, PT, OT, prophylactic antibiotics, VTE prophylaxis, progressive ambulation and ADL's and discharge planning. The patient is planning to be discharged to skilled nursing facility [facility])  Topical TXA  PCP: Dr. Quay Burow   Ardeen Jourdain, PA-C

## 2014-06-18 ENCOUNTER — Inpatient Hospital Stay (HOSPITAL_COMMUNITY): Payer: Medicare Other | Admitting: Anesthesiology

## 2014-06-18 ENCOUNTER — Encounter (HOSPITAL_COMMUNITY)
Admission: RE | Disposition: A | Discharge status: Skilled Nursing Facility | Payer: Self-pay | Source: Ambulatory Visit | Attending: Orthopedic Surgery

## 2014-06-18 ENCOUNTER — Encounter (HOSPITAL_COMMUNITY): Payer: Self-pay | Admitting: *Deleted

## 2014-06-18 ENCOUNTER — Inpatient Hospital Stay (HOSPITAL_COMMUNITY)
Admission: RE | Admit: 2014-06-18 | Discharge: 2014-06-21 | DRG: 470 | Disposition: A | Discharge status: Skilled Nursing Facility | Payer: Medicare Other | Source: Ambulatory Visit | Attending: Orthopedic Surgery | Admitting: Orthopedic Surgery

## 2014-06-18 ENCOUNTER — Encounter (HOSPITAL_COMMUNITY): Payer: Medicare Other | Admitting: Anesthesiology

## 2014-06-18 DIAGNOSIS — J449 Chronic obstructive pulmonary disease, unspecified: Secondary | ICD-10-CM | POA: Diagnosis present

## 2014-06-18 DIAGNOSIS — I2510 Atherosclerotic heart disease of native coronary artery without angina pectoris: Secondary | ICD-10-CM | POA: Diagnosis not present

## 2014-06-18 DIAGNOSIS — E559 Vitamin D deficiency, unspecified: Secondary | ICD-10-CM | POA: Diagnosis not present

## 2014-06-18 DIAGNOSIS — M21061 Valgus deformity, not elsewhere classified, right knee: Secondary | ICD-10-CM | POA: Diagnosis present

## 2014-06-18 DIAGNOSIS — M6281 Muscle weakness (generalized): Secondary | ICD-10-CM | POA: Diagnosis not present

## 2014-06-18 DIAGNOSIS — Z96659 Presence of unspecified artificial knee joint: Secondary | ICD-10-CM

## 2014-06-18 DIAGNOSIS — Z87891 Personal history of nicotine dependence: Secondary | ICD-10-CM

## 2014-06-18 DIAGNOSIS — I10 Essential (primary) hypertension: Secondary | ICD-10-CM | POA: Diagnosis present

## 2014-06-18 DIAGNOSIS — K219 Gastro-esophageal reflux disease without esophagitis: Secondary | ICD-10-CM | POA: Diagnosis present

## 2014-06-18 DIAGNOSIS — Z96651 Presence of right artificial knee joint: Secondary | ICD-10-CM

## 2014-06-18 DIAGNOSIS — Z7982 Long term (current) use of aspirin: Secondary | ICD-10-CM

## 2014-06-18 DIAGNOSIS — E039 Hypothyroidism, unspecified: Secondary | ICD-10-CM | POA: Diagnosis present

## 2014-06-18 DIAGNOSIS — E785 Hyperlipidemia, unspecified: Secondary | ICD-10-CM | POA: Diagnosis present

## 2014-06-18 DIAGNOSIS — Z7902 Long term (current) use of antithrombotics/antiplatelets: Secondary | ICD-10-CM

## 2014-06-18 DIAGNOSIS — M179 Osteoarthritis of knee, unspecified: Secondary | ICD-10-CM | POA: Diagnosis not present

## 2014-06-18 DIAGNOSIS — Z6823 Body mass index (BMI) 23.0-23.9, adult: Secondary | ICD-10-CM | POA: Diagnosis not present

## 2014-06-18 DIAGNOSIS — F5101 Primary insomnia: Secondary | ICD-10-CM | POA: Diagnosis not present

## 2014-06-18 DIAGNOSIS — M8580 Other specified disorders of bone density and structure, unspecified site: Secondary | ICD-10-CM | POA: Diagnosis not present

## 2014-06-18 DIAGNOSIS — I739 Peripheral vascular disease, unspecified: Secondary | ICD-10-CM | POA: Diagnosis not present

## 2014-06-18 DIAGNOSIS — Z471 Aftercare following joint replacement surgery: Secondary | ICD-10-CM | POA: Diagnosis not present

## 2014-06-18 DIAGNOSIS — Z01812 Encounter for preprocedural laboratory examination: Secondary | ICD-10-CM

## 2014-06-18 DIAGNOSIS — Z951 Presence of aortocoronary bypass graft: Secondary | ICD-10-CM

## 2014-06-18 DIAGNOSIS — Z79899 Other long term (current) drug therapy: Secondary | ICD-10-CM

## 2014-06-18 DIAGNOSIS — M24561 Contracture, right knee: Secondary | ICD-10-CM | POA: Diagnosis present

## 2014-06-18 DIAGNOSIS — M1711 Unilateral primary osteoarthritis, right knee: Secondary | ICD-10-CM | POA: Diagnosis present

## 2014-06-18 DIAGNOSIS — S83104A Unspecified dislocation of right knee, initial encounter: Secondary | ICD-10-CM | POA: Diagnosis not present

## 2014-06-18 DIAGNOSIS — M25561 Pain in right knee: Secondary | ICD-10-CM | POA: Diagnosis not present

## 2014-06-18 DIAGNOSIS — D649 Anemia, unspecified: Secondary | ICD-10-CM | POA: Diagnosis not present

## 2014-06-18 DIAGNOSIS — Z7901 Long term (current) use of anticoagulants: Secondary | ICD-10-CM | POA: Diagnosis not present

## 2014-06-18 HISTORY — PX: TOTAL KNEE ARTHROPLASTY: SHX125

## 2014-06-18 LAB — TYPE AND SCREEN
ABO/RH(D): B POS
Antibody Screen: NEGATIVE

## 2014-06-18 SURGERY — ARTHROPLASTY, KNEE, TOTAL
Anesthesia: General | Site: Knee | Laterality: Right

## 2014-06-18 MED ORDER — THROMBIN 5000 UNITS EX SOLR
CUTANEOUS | Status: AC
  Filled 2014-06-18: qty 5000

## 2014-06-18 MED ORDER — ONDANSETRON HCL 4 MG PO TABS
4.0000 mg | ORAL_TABLET | Freq: Four times a day (QID) | ORAL | Status: DC | PRN
  Administered 2014-06-20: 4 mg via ORAL
  Filled 2014-06-18: qty 1

## 2014-06-18 MED ORDER — POLYETHYLENE GLYCOL 3350 17 G PO PACK: 17.0000 g | PACK | Freq: Every day | ORAL | Status: DC | PRN

## 2014-06-18 MED ORDER — MENTHOL 3 MG MT LOZG
1.0000 | LOZENGE | OROMUCOSAL | Status: DC | PRN
  Filled 2014-06-18: qty 9

## 2014-06-18 MED ORDER — SODIUM CHLORIDE 0.9 % IR SOLN
Status: DC | PRN
  Administered 2014-06-18: 13:00:00

## 2014-06-18 MED ORDER — HYDROMORPHONE HCL 1 MG/ML IJ SOLN
0.2500 mg | INTRAMUSCULAR | Status: DC | PRN
  Administered 2014-06-18 (×2): 0.5 mg via INTRAVENOUS

## 2014-06-18 MED ORDER — ACETAMINOPHEN 650 MG RE SUPP: 650.0000 mg | Freq: Four times a day (QID) | RECTAL | Status: DC | PRN

## 2014-06-18 MED ORDER — SODIUM CHLORIDE 0.9 % IJ SOLN
INTRAMUSCULAR | Status: DC | PRN
  Administered 2014-06-18: 20 mL

## 2014-06-18 MED ORDER — LIDOCAINE HCL (CARDIAC) 20 MG/ML IV SOLN
INTRAVENOUS | Status: DC | PRN
  Administered 2014-06-18: 50 mg via INTRAVENOUS

## 2014-06-18 MED ORDER — METHOCARBAMOL 1000 MG/10ML IJ SOLN
500.0000 mg | Freq: Four times a day (QID) | INTRAVENOUS | Status: DC | PRN
  Filled 2014-06-18: qty 5

## 2014-06-18 MED ORDER — NEOSTIGMINE METHYLSULFATE 10 MG/10ML IV SOLN
INTRAVENOUS | Status: DC | PRN
  Administered 2014-06-18: 3 mg via INTRAVENOUS

## 2014-06-18 MED ORDER — FENTANYL CITRATE 0.05 MG/ML IJ SOLN
INTRAMUSCULAR | Status: AC
  Filled 2014-06-18: qty 2

## 2014-06-18 MED ORDER — PROMETHAZINE HCL 25 MG/ML IJ SOLN
6.2500 mg | Freq: Four times a day (QID) | INTRAMUSCULAR | Status: DC | PRN
  Administered 2014-06-19: 6.25 mg via INTRAVENOUS
  Filled 2014-06-18: qty 1

## 2014-06-18 MED ORDER — ALUM & MAG HYDROXIDE-SIMETH 200-200-20 MG/5ML PO SUSP
30.0000 mL | ORAL | Status: DC | PRN
  Filled 2014-06-18: qty 30

## 2014-06-18 MED ORDER — CELECOXIB 200 MG PO CAPS
200.0000 mg | ORAL_CAPSULE | Freq: Two times a day (BID) | ORAL | Status: DC
  Administered 2014-06-18 – 2014-06-21 (×6): 200 mg via ORAL
  Filled 2014-06-18 (×7): qty 1

## 2014-06-18 MED ORDER — FLEET ENEMA 7-19 GM/118ML RE ENEM: 1.0000 | ENEMA | Freq: Once | RECTAL | Status: AC | PRN

## 2014-06-18 MED ORDER — ONDANSETRON HCL 4 MG/2ML IJ SOLN
4.0000 mg | Freq: Four times a day (QID) | INTRAMUSCULAR | Status: DC | PRN
  Administered 2014-06-18 – 2014-06-20 (×3): 4 mg via INTRAVENOUS
  Filled 2014-06-18 (×3): qty 2

## 2014-06-18 MED ORDER — HYDROMORPHONE HCL 1 MG/ML IJ SOLN: 1.0000 mg | INTRAMUSCULAR | Status: DC | PRN

## 2014-06-18 MED ORDER — LABETALOL HCL 5 MG/ML IV SOLN
INTRAVENOUS | Status: DC | PRN
  Administered 2014-06-18: 5 mg via INTRAVENOUS

## 2014-06-18 MED ORDER — BUPIVACAINE-EPINEPHRINE (PF) 0.25% -1:200000 IJ SOLN
INTRAMUSCULAR | Status: DC | PRN
  Administered 2014-06-18: 20 mL

## 2014-06-18 MED ORDER — LACTATED RINGERS IV SOLN
INTRAVENOUS | Status: DC
  Administered 2014-06-18: 1000 mL via INTRAVENOUS

## 2014-06-18 MED ORDER — ACETAMINOPHEN 325 MG PO TABS: 650.0000 mg | ORAL_TABLET | Freq: Four times a day (QID) | ORAL | Status: DC | PRN

## 2014-06-18 MED ORDER — CEFAZOLIN SODIUM 1-5 GM-% IV SOLN
1.0000 g | Freq: Four times a day (QID) | INTRAVENOUS | Status: AC
  Administered 2014-06-18 – 2014-06-19 (×2): 1 g via INTRAVENOUS
  Filled 2014-06-18 (×2): qty 50

## 2014-06-18 MED ORDER — LACTATED RINGERS IV SOLN
INTRAVENOUS | Status: DC | PRN
  Administered 2014-06-18 (×2): via INTRAVENOUS

## 2014-06-18 MED ORDER — PROMETHAZINE HCL 25 MG/ML IJ SOLN
6.2500 mg | INTRAMUSCULAR | Status: DC | PRN
Start: 2014-06-18 — End: 2014-06-18

## 2014-06-18 MED ORDER — BUPIVACAINE LIPOSOME 1.3 % IJ SUSP
INTRAMUSCULAR | Status: DC | PRN
Start: 2014-06-18 — End: 2014-06-18
  Administered 2014-06-18: 20 mL

## 2014-06-18 MED ORDER — OXYCODONE-ACETAMINOPHEN 5-325 MG PO TABS
2.0000 | ORAL_TABLET | ORAL | Status: DC | PRN
  Administered 2014-06-18 (×2): 2 via ORAL
  Administered 2014-06-19 (×4): 1 via ORAL
  Administered 2014-06-20 – 2014-06-21 (×4): 2 via ORAL
  Filled 2014-06-18 (×13): qty 2

## 2014-06-18 MED ORDER — HYDROMORPHONE HCL 1 MG/ML IJ SOLN
INTRAMUSCULAR | Status: DC | PRN
  Administered 2014-06-18: 0.5 mg via INTRAVENOUS

## 2014-06-18 MED ORDER — SODIUM CHLORIDE 0.9 % IJ SOLN
INTRAMUSCULAR | Status: AC
  Filled 2014-06-18: qty 20

## 2014-06-18 MED ORDER — PROPOFOL 10 MG/ML IV BOLUS
INTRAVENOUS | Status: DC | PRN
  Administered 2014-06-18: 120 mg via INTRAVENOUS

## 2014-06-18 MED ORDER — FENTANYL CITRATE 0.05 MG/ML IJ SOLN
INTRAMUSCULAR | Status: AC
  Filled 2014-06-18: qty 5

## 2014-06-18 MED ORDER — ROCURONIUM BROMIDE 100 MG/10ML IV SOLN
INTRAVENOUS | Status: DC | PRN
  Administered 2014-06-18: 40 mg via INTRAVENOUS

## 2014-06-18 MED ORDER — NEOSTIGMINE METHYLSULFATE 10 MG/10ML IV SOLN
INTRAVENOUS | Status: AC
  Filled 2014-06-18: qty 1

## 2014-06-18 MED ORDER — ROCURONIUM BROMIDE 100 MG/10ML IV SOLN
INTRAVENOUS | Status: AC
  Filled 2014-06-18: qty 1

## 2014-06-18 MED ORDER — HYDROCHLOROTHIAZIDE 25 MG PO TABS
25.0000 mg | ORAL_TABLET | Freq: Every day | ORAL | Status: DC
  Administered 2014-06-18 – 2014-06-20 (×3): 25 mg via ORAL
  Filled 2014-06-18 (×4): qty 1

## 2014-06-18 MED ORDER — FERROUS SULFATE 325 (65 FE) MG PO TABS
325.0000 mg | ORAL_TABLET | Freq: Three times a day (TID) | ORAL | Status: DC
  Administered 2014-06-19 – 2014-06-20 (×4): 325 mg via ORAL
  Filled 2014-06-18 (×11): qty 1

## 2014-06-18 MED ORDER — ACETAMINOPHEN 10 MG/ML IV SOLN
1000.0000 mg | Freq: Once | INTRAVENOUS | Status: AC
  Administered 2014-06-18: 1000 mg via INTRAVENOUS
  Filled 2014-06-18: qty 100

## 2014-06-18 MED ORDER — METHOCARBAMOL 500 MG PO TABS
500.0000 mg | ORAL_TABLET | Freq: Four times a day (QID) | ORAL | Status: DC | PRN
  Administered 2014-06-18 – 2014-06-21 (×5): 500 mg via ORAL
  Filled 2014-06-18 (×7): qty 1

## 2014-06-18 MED ORDER — PROPOFOL 10 MG/ML IV BOLUS
INTRAVENOUS | Status: AC
  Filled 2014-06-18: qty 20

## 2014-06-18 MED ORDER — LIDOCAINE HCL (CARDIAC) 20 MG/ML IV SOLN
INTRAVENOUS | Status: AC
  Filled 2014-06-18: qty 5

## 2014-06-18 MED ORDER — CEFAZOLIN SODIUM-DEXTROSE 2-3 GM-% IV SOLR
2.0000 g | INTRAVENOUS | Status: AC
  Administered 2014-06-18: 2 g via INTRAVENOUS

## 2014-06-18 MED ORDER — ONDANSETRON HCL 4 MG/2ML IJ SOLN
INTRAMUSCULAR | Status: AC
  Filled 2014-06-18: qty 2

## 2014-06-18 MED ORDER — LABETALOL HCL 5 MG/ML IV SOLN
INTRAVENOUS | Status: AC
Start: 2014-06-18 — End: 2014-06-18
  Filled 2014-06-18: qty 4

## 2014-06-18 MED ORDER — CHLORHEXIDINE GLUCONATE 4 % EX LIQD
60.0000 mL | Freq: Once | CUTANEOUS | Status: DC
Start: 2014-06-18 — End: 2014-06-18

## 2014-06-18 MED ORDER — BISACODYL 5 MG PO TBEC: 5.0000 mg | DELAYED_RELEASE_TABLET | Freq: Every day | ORAL | Status: DC | PRN

## 2014-06-18 MED ORDER — BUPIVACAINE-EPINEPHRINE (PF) 0.25% -1:200000 IJ SOLN
INTRAMUSCULAR | Status: AC
  Filled 2014-06-18: qty 30

## 2014-06-18 MED ORDER — GLYCOPYRROLATE 0.2 MG/ML IJ SOLN
INTRAMUSCULAR | Status: AC
  Filled 2014-06-18: qty 2

## 2014-06-18 MED ORDER — LACTATED RINGERS IV SOLN
INTRAVENOUS | Status: DC
  Administered 2014-06-18 – 2014-06-19 (×2): via INTRAVENOUS

## 2014-06-18 MED ORDER — CHLORHEXIDINE GLUCONATE 4 % EX LIQD: 60.0000 mL | Freq: Once | CUTANEOUS | Status: DC

## 2014-06-18 MED ORDER — FENTANYL CITRATE 0.05 MG/ML IJ SOLN
INTRAMUSCULAR | Status: DC | PRN
  Administered 2014-06-18 (×2): 50 ug via INTRAVENOUS
  Administered 2014-06-18 (×2): 100 ug via INTRAVENOUS
  Administered 2014-06-18: 50 ug via INTRAVENOUS

## 2014-06-18 MED ORDER — PRAVASTATIN SODIUM 20 MG PO TABS
20.0000 mg | ORAL_TABLET | Freq: Every day | ORAL | Status: DC
  Administered 2014-06-18 – 2014-06-20 (×3): 20 mg via ORAL
  Filled 2014-06-18 (×4): qty 1

## 2014-06-18 MED ORDER — PHENOL 1.4 % MT LIQD
1.0000 | OROMUCOSAL | Status: DC | PRN
  Filled 2014-06-18: qty 177

## 2014-06-18 MED ORDER — SODIUM CHLORIDE 0.9 % IR SOLN
Status: AC
  Filled 2014-06-18: qty 1

## 2014-06-18 MED ORDER — AMLODIPINE BESYLATE 10 MG PO TABS
10.0000 mg | ORAL_TABLET | Freq: Every day | ORAL | Status: DC
  Administered 2014-06-18 – 2014-06-19 (×2): 10 mg via ORAL
  Filled 2014-06-18 (×4): qty 1

## 2014-06-18 MED ORDER — GLYCOPYRROLATE 0.2 MG/ML IJ SOLN
INTRAMUSCULAR | Status: DC | PRN
  Administered 2014-06-18: 0.4 mg via INTRAVENOUS

## 2014-06-18 MED ORDER — RIVAROXABAN 10 MG PO TABS
10.0000 mg | ORAL_TABLET | Freq: Every day | ORAL | Status: DC
  Administered 2014-06-19 – 2014-06-21 (×3): 10 mg via ORAL
  Filled 2014-06-18 (×4): qty 1

## 2014-06-18 MED ORDER — BUPIVACAINE LIPOSOME 1.3 % IJ SUSP
20.0000 mL | Freq: Once | INTRAMUSCULAR | Status: DC
  Filled 2014-06-18: qty 20

## 2014-06-18 MED ORDER — HYDROCODONE-ACETAMINOPHEN 5-325 MG PO TABS: 1.0000 | ORAL_TABLET | ORAL | Status: DC | PRN

## 2014-06-18 MED ORDER — HYDROMORPHONE HCL 2 MG/ML IJ SOLN
INTRAMUSCULAR | Status: AC
  Filled 2014-06-18: qty 1

## 2014-06-18 MED ORDER — STERILE WATER FOR IRRIGATION IR SOLN
Status: DC | PRN
  Administered 2014-06-18: 1500 mL

## 2014-06-18 MED ORDER — CEFAZOLIN SODIUM-DEXTROSE 2-3 GM-% IV SOLR
INTRAVENOUS | Status: AC
  Filled 2014-06-18: qty 50

## 2014-06-18 MED ORDER — LACTATED RINGERS IV SOLN: INTRAVENOUS | Status: DC

## 2014-06-18 MED ORDER — GELATIN ABSORBABLE MT POWD
OROMUCOSAL | Status: DC | PRN
  Administered 2014-06-18: 13:00:00 via TOPICAL

## 2014-06-18 MED ORDER — LEVOTHYROXINE SODIUM 112 MCG PO TABS
112.0000 ug | ORAL_TABLET | Freq: Every day | ORAL | Status: DC
  Administered 2014-06-19 – 2014-06-21 (×3): 112 ug via ORAL
  Filled 2014-06-18 (×4): qty 1

## 2014-06-18 MED ORDER — DEXAMETHASONE SODIUM PHOSPHATE 10 MG/ML IJ SOLN
INTRAMUSCULAR | Status: AC
  Filled 2014-06-18: qty 1

## 2014-06-18 MED ORDER — HYDROMORPHONE HCL 1 MG/ML IJ SOLN
INTRAMUSCULAR | Status: AC
  Filled 2014-06-18: qty 1

## 2014-06-18 SURGICAL SUPPLY — 70 items
ADH SKN CLS APL DERMABOND .7 (GAUZE/BANDAGES/DRESSINGS) ×1
BAG SPEC THK2 15X12 ZIP CLS (MISCELLANEOUS)
BAG ZIPLOCK 12X15 (MISCELLANEOUS) IMPLANT
BANDAGE ELASTIC 4 VELCRO ST LF (GAUZE/BANDAGES/DRESSINGS) ×2 IMPLANT
BANDAGE ELASTIC 6 VELCRO ST LF (GAUZE/BANDAGES/DRESSINGS) ×2 IMPLANT
BANDAGE ESMARK 6X9 LF (GAUZE/BANDAGES/DRESSINGS) ×1 IMPLANT
BLADE SAG 18X100X1.27 (BLADE) ×2 IMPLANT
BLADE SAW SGTL 11.0X1.19X90.0M (BLADE) ×2 IMPLANT
BNDG CMPR 9X6 STRL LF SNTH (GAUZE/BANDAGES/DRESSINGS) ×1
BNDG ESMARK 6X9 LF (GAUZE/BANDAGES/DRESSINGS) ×2
BONE CEMENT GENTAMICIN (Cement) ×4 IMPLANT
CAPT RP KNEE ×1 IMPLANT
CEMENT BONE GENTAMICIN 40 (Cement) ×2 IMPLANT
CUFF TOURN SGL QUICK 34 (TOURNIQUET CUFF) ×2
CUFF TRNQT CYL 34X4X40X1 (TOURNIQUET CUFF) ×1 IMPLANT
DERMABOND ADVANCED (GAUZE/BANDAGES/DRESSINGS) ×1
DERMABOND ADVANCED .7 DNX12 (GAUZE/BANDAGES/DRESSINGS) ×1 IMPLANT
DRAPE EXTREMITY TIBURON (DRAPES) ×2 IMPLANT
DRAPE INCISE IOBAN 66X45 STRL (DRAPES) IMPLANT
DRAPE POUCH INSTRU U-SHP 10X18 (DRAPES) ×2 IMPLANT
DRAPE U-SHAPE 47X51 STRL (DRAPES) ×2 IMPLANT
DRSG AQUACEL AG ADV 3.5X10 (GAUZE/BANDAGES/DRESSINGS) ×2 IMPLANT
DRSG PAD ABDOMINAL 8X10 ST (GAUZE/BANDAGES/DRESSINGS) IMPLANT
DRSG TEGADERM 4X4.75 (GAUZE/BANDAGES/DRESSINGS) ×2 IMPLANT
DURAPREP 26ML APPLICATOR (WOUND CARE) ×2 IMPLANT
ELECT REM PT RETURN 9FT ADLT (ELECTROSURGICAL) ×2
ELECTRODE REM PT RTRN 9FT ADLT (ELECTROSURGICAL) ×1 IMPLANT
EVACUATOR 1/8 PVC DRAIN (DRAIN) ×2 IMPLANT
FACESHIELD WRAPAROUND (MASK) ×10 IMPLANT
FACESHIELD WRAPAROUND OR TEAM (MASK) ×5 IMPLANT
GAUZE SPONGE 2X2 8PLY STRL LF (GAUZE/BANDAGES/DRESSINGS) ×1 IMPLANT
GLOVE BIOGEL PI IND STRL 6.5 (GLOVE) ×1 IMPLANT
GLOVE BIOGEL PI IND STRL 8 (GLOVE) ×1 IMPLANT
GLOVE BIOGEL PI INDICATOR 6.5 (GLOVE) ×1
GLOVE BIOGEL PI INDICATOR 8 (GLOVE) ×1
GLOVE ECLIPSE 8.0 STRL XLNG CF (GLOVE) ×4 IMPLANT
GLOVE SURG SS PI 6.5 STRL IVOR (GLOVE) ×2 IMPLANT
GOWN STRL REUS W/TWL LRG LVL3 (GOWN DISPOSABLE) ×2 IMPLANT
GOWN STRL REUS W/TWL XL LVL3 (GOWN DISPOSABLE) ×2 IMPLANT
HANDPIECE INTERPULSE COAX TIP (DISPOSABLE) ×2
IMMOBILIZER KNEE 20 (SOFTGOODS) ×2
IMMOBILIZER KNEE 20 THIGH 36 (SOFTGOODS) ×1 IMPLANT
KIT BASIN OR (CUSTOM PROCEDURE TRAY) ×2 IMPLANT
MANIFOLD NEPTUNE II (INSTRUMENTS) ×2 IMPLANT
NEEDLE HYPO 22GX1.5 SAFETY (NEEDLE) ×4 IMPLANT
NS IRRIG 1000ML POUR BTL (IV SOLUTION) IMPLANT
PACK TOTAL JOINT (CUSTOM PROCEDURE TRAY) ×2 IMPLANT
PADDING CAST COTTON 6X4 STRL (CAST SUPPLIES) IMPLANT
POSITIONER SURGICAL ARM (MISCELLANEOUS) ×2 IMPLANT
SET HNDPC FAN SPRY TIP SCT (DISPOSABLE) ×1 IMPLANT
SET PAD KNEE POSITIONER (MISCELLANEOUS) ×2 IMPLANT
SPONGE GAUZE 2X2 STER 10/PKG (GAUZE/BANDAGES/DRESSINGS) ×1
SPONGE LAP 18X18 X RAY DECT (DISPOSABLE) IMPLANT
SPONGE SURGIFOAM ABS GEL 100 (HEMOSTASIS) ×2 IMPLANT
STAPLER VISISTAT 35W (STAPLE) IMPLANT
SUCTION FRAZIER 12FR DISP (SUCTIONS) ×2 IMPLANT
SUT BONE WAX W31G (SUTURE) ×2 IMPLANT
SUT MNCRL AB 4-0 PS2 18 (SUTURE) ×2 IMPLANT
SUT VIC AB 1 CT1 27 (SUTURE) ×4
SUT VIC AB 1 CT1 27XBRD ANTBC (SUTURE) ×2 IMPLANT
SUT VIC AB 2-0 CT1 27 (SUTURE) ×6
SUT VIC AB 2-0 CT1 TAPERPNT 27 (SUTURE) ×3 IMPLANT
SUT VLOC 180 0 24IN GS25 (SUTURE) ×2 IMPLANT
SYR 20CC LL (SYRINGE) ×6 IMPLANT
TOWEL OR 17X26 10 PK STRL BLUE (TOWEL DISPOSABLE) ×2 IMPLANT
TOWEL OR NON WOVEN STRL DISP B (DISPOSABLE) IMPLANT
TOWER CARTRIDGE SMART MIX (DISPOSABLE) ×2 IMPLANT
TRAY FOLEY CATH 14FRSI W/METER (CATHETERS) ×2 IMPLANT
WATER STERILE IRR 1500ML POUR (IV SOLUTION) ×2 IMPLANT
WRAP KNEE MAXI GEL POST OP (GAUZE/BANDAGES/DRESSINGS) ×2 IMPLANT

## 2014-06-18 NOTE — Interval H&P Note (Signed)
History and Physical Interval Note:  06/18/2014 12:33 PM  [name]  has presented today for surgery, with the diagnosis of OA OF RIGHT KNEE  The various methods of treatment have been discussed with the patient and family. After consideration of risks, benefits and other options for treatment, the patient has consented to  Procedure(s): RIGHT TOTAL KNEE ARTHROPLASTY (Right) as a surgical intervention .  The patient's history has been reviewed, patient examined, no change in status, stable for surgery.  I have reviewed the patient's chart and labs.  Questions were answered to the patient's satisfaction.     Coreon Simkins A

## 2014-06-18 NOTE — Op Note (Signed)
NAMELINDSAY, [name] NO.:  [id number]  MEDICAL RECORD NO.:  [id number]  LOCATION:  [facility]                         FACILITY:  [facility]  PHYSICIAN:  Kipp Brood. Elfreda Blanchet, M.D.DATE OF BIRTH:  May 07, 1946  DATE OF PROCEDURE:  06/18/2014 DATE OF DISCHARGE:                              OPERATIVE REPORT   SURGEON:  Kipp Brood. Gladstone Lighter, M.D.  ASSISTANT:  Ardeen Jourdain, [place].  PREOPERATIVE DIAGNOSES: 1. 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 2. Degenerative arthritic right knee. 3. Flexion contracture.  POSTOPERATIVE DIAGNOSES: 1. 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 2. Degenerative arthritic right knee. 3. Flexion contracture.  OPERATION:  Right total knee arthroplasty and release of contractures. The sizes used; I utilized a size 2.5 right femoral component posterior cruciate sacrificing type, tray was a size 2, the insert was a size 2 with a 12.5 mm thickness rotating platform.  The patella was a size 35 with 3 pegs.  At this time, all 3 components were cemented utilizing gentamicin and cement.  After the appropriate time-out was carried out, I marked the appropriate right leg in the holding area.  After sterile prep and draping, the leg was placed in the [facility] knee holder. Tourniquet was elevated to 325 after we exsanguinated the extremity. At this time with the knee flexed, an anterior approach of the knee was carried out.  Bleeders were identified and cauterized.  At this time, a median parapatellar incision was carried out.  I reflected the patella laterally with the knee flexed.  I did medial and lateral meniscectomies and excised the anterior and posterior cruciate ligaments.  We did a synovectomy as well.  Initial drill holes were made in the intercondylar notch.  The guide rod was entered and inserted up into the canal.  I then thoroughly irrigated the canal and removed 12 mm thickness of the distal femur at this time.  I measured the femur to be a size 2.5  right. We then carried out our anterior-posterior chamfering cuts with the appropriate jig for size 2.5 femur.  Following that, the tibia was prepared in the usual fashion.  We did soft tissue releases to release the contracture as well.  I went around medially and posteriorly as well for the release of the contractures.  I then made drill holes in the tibial plateau.  At this time,  a guide wire was inserted.  I thoroughly irrigated out the canal after removing the guide rod.  We then removed 6 mm thickness off the affected side.  At this particular time, we then inserted our lamina spreaders and removed the posterior spurs and made sure we had good releases.  I then inserted the spacer blocks and we elected to utilize a 12.5 mm thickness spacer blocks for flexion extension. The block was removed. We then irrigated out the knee and then cut our keel cut out of the proximal tibia and the plateau. Following that, we did our notch cut out of the distal femur.  The trial components were inserted. We  first tried a 10 mm thickness insert and at 12.5, we had much better fit and much better stability was 12.5. We then did a resurfacing procedure on  the patella in the usual fashion. Three drill holes were made in patella after we measured the patella to be a size 35.  At this time, all trial components were removed.  I thoroughly irrigated out the knee and then cemented all 3 components in simultaneously  utilizing gentamicin and the cement.  After the cement was hardened, we removed all loose pieces of cement.  I then water picked out the knee, and then injected 20 mL of 0.25% Marcaine with epinephrine in the surrounding tissue.  I inserted some thrombin-soaked Gelfoam posteriorly and medially and inserted our permanent rotating platform after we went through trials again. We finally selected the 12.5 mm thickness platform. We inserted it, reduced the knee and took the knee through motion.  We  had good medial and lateral stability, good flexion extension.  I then closed the knee over Hemovac drain in usual fashion.  The remaining part of the wound was closed in usual fashion. Sterile dressings were applied.  Note, the patient had 2 g IV Ancef preop.          ______________________________ Kipp Brood. Gladstone Lighter, M.D.     RAG/MEDQ  D:  06/18/2014  T:  06/18/2014  Job:  [id number]

## 2014-06-18 NOTE — Brief Op Note (Signed)
06/18/2014  2:14 PM  PATIENT:  [name]  68 y.o. female  PRE-OPERATIVE DIAGNOSIS:  OA OF RIGHT KNEE  POST-OPERATIVE DIAGNOSIS:  OA OF RIGHT KNEE  PROCEDURE:  Procedure(s): RIGHT TOTAL KNEE ARTHROPLASTY (Right)  SURGEON:  Surgeon(s) and Role:    * Tobi Bastos, MD - Primary  PHYSICIAN ASSISTANT:Amber Hummels Wharf PA   ASSISTANTS: Ardeen Jourdain PA  ANESTHESIA:   general  EBL:  Total I/O In: 1000 [I.V.:1000] Out: -   BLOOD ADMINISTERED:none  DRAINS: (ONE) Hemovact drain(s) in the Right Knee with  Suction Open   LOCAL MEDICATIONS USED:  MARCAINE 20cc of 0.25% with Epinephrine then at the end of the case 20cc of Exparel with 20cc of Normal Saline.    SPECIMEN:  No Specimen  DISPOSITION OF SPECIMEN:  N/A  COUNTS:  YES  TOURNIQUET:  * Missing tourniquet times found for documented tourniquets in log:  [id number] *  DICTATION: .Other Dictation: Dictation Number [id number]  PLAN OF CARE: Admit to inpatient   PATIENT DISPOSITION:  Stable in OR   Delay start of Pharmacological VTE agent (>24hrs) due to surgical blood loss or risk of bleeding: yes

## 2014-06-18 NOTE — Progress Notes (Signed)
Utilization review completed.  

## 2014-06-18 NOTE — Anesthesia Postprocedure Evaluation (Signed)
  Anesthesia Post-op Note  Patient: [name]  Procedure(s) Performed: Procedure(s) (LRB): RIGHT TOTAL KNEE ARTHROPLASTY (Right)  Patient Location: PACU  Anesthesia Type: General  Level of Consciousness: awake and alert   Airway and Oxygen Therapy: Patient Spontanous Breathing  Post-op Pain: mild  Post-op Assessment: Post-op Vital signs reviewed, Patient's Cardiovascular Status Stable, Respiratory Function Stable, Patent Airway and No signs of Nausea or vomiting  Last Vitals:  Filed Vitals:   06/18/14 1610  BP: 140/82  Pulse: 95  Temp: 36.7 C  Resp: 14    Post-op Vital Signs: stable   Complications: No apparent anesthesia complications

## 2014-06-18 NOTE — Anesthesia Preprocedure Evaluation (Addendum)
Anesthesia Evaluation  Patient identified by MRN, date of birth, ID band Patient awake    Reviewed: Allergy & Precautions, H&P , NPO status , Patient's Chart, lab work & pertinent test results  Airway Mallampati: II  TM Distance: >3 FB Neck ROM: Full    Dental no notable dental hx.    Pulmonary COPDformer smoker,  breath sounds clear to auscultation  Pulmonary exam normal       Cardiovascular Exercise Tolerance: Good hypertension, Pt. on medications + CAD and + Peripheral Vascular Disease + Valvular Problems/Murmurs Rhythm:Regular Rate:Normal     Neuro/Psych PSYCHIATRIC DISORDERS Anxiety  Neuromuscular disease    GI/Hepatic Neg liver ROS, GERD-  ,  Endo/Other  Hypothyroidism   Renal/GU negative Renal ROS  negative genitourinary   Musculoskeletal  (+) Arthritis -,   Abdominal   Peds negative pediatric ROS (+)  Hematology  (+) anemia ,   Anesthesia Other Findings   Reproductive/Obstetrics negative OB ROS                            Anesthesia Physical Anesthesia Plan  ASA: III  Anesthesia Plan: General   Post-op Pain Management:    Induction: Intravenous  Airway Management Planned: Oral ETT  Additional Equipment:   Intra-op Plan:   Post-operative Plan: Extubation in OR  Informed Consent: I have reviewed the patients History and Physical, chart, labs and discussed the procedure including the risks, benefits and alternatives for the proposed anesthesia with the patient or authorized representative who has indicated his/her understanding and acceptance.   Dental advisory given  Plan Discussed with: CRNA  Anesthesia Plan Comments:         Anesthesia Quick Evaluation

## 2014-06-18 NOTE — Transfer of Care (Signed)
Immediate Anesthesia Transfer of Care Note  Patient: [name]  Procedure(s) Performed: Procedure(s) (LRB): RIGHT TOTAL KNEE ARTHROPLASTY (Right)  Patient Location: PACU  Anesthesia Type: General  Level of Consciousness: sedated, patient cooperative and responds to stimulation  Airway & Oxygen Therapy: Patient Spontanous Breathing and Patient connected to face mask oxgen  Post-op Assessment: Report given to PACU RN and Post -op Vital signs reviewed and stable  Post vital signs: Reviewed and stable  Complications: No apparent anesthesia complications

## 2014-06-19 LAB — CBC
HCT: 34.9 % — ABNORMAL LOW (ref 36.0–46.0)
Hemoglobin: 11.6 g/dL — ABNORMAL LOW (ref 12.0–15.0)
MCH: 28 pg (ref 26.0–34.0)
MCHC: 33.2 g/dL (ref 30.0–36.0)
MCV: 84.3 fL (ref 78.0–100.0)
Platelets: 252 10*3/uL (ref 150–400)
RBC: 4.14 MIL/uL (ref 3.87–5.11)
RDW: 13.9 % (ref 11.5–15.5)
WBC: 11.2 10*3/uL — ABNORMAL HIGH (ref 4.0–10.5)

## 2014-06-19 LAB — BASIC METABOLIC PANEL
Anion gap: 15 (ref 5–15)
BUN: 9 mg/dL (ref 6–23)
CO2: 24 mEq/L (ref 19–32)
Calcium: 9 mg/dL (ref 8.4–10.5)
Chloride: 101 mEq/L (ref 96–112)
Creatinine, Ser: 0.46 mg/dL — ABNORMAL LOW (ref 0.50–1.10)
GFR calc Af Amer: >90 mL/min (ref >90)
GFR calc non Af Amer: >90 mL/min (ref >90)
Glucose, Bld: 198 mg/dL — ABNORMAL HIGH (ref 70–99)
Potassium: 4.3 mEq/L (ref 3.7–5.3)
Sodium: 140 mEq/L (ref 137–147)

## 2014-06-19 LAB — ABO/RH: ABO/RH(D): B POS

## 2014-06-19 NOTE — Evaluation (Signed)
Physical Therapy Evaluation Patient Details Name: [name] MRN: [id number] DOB: [birth year] Today's Date: 06/19/2014   History of Present Illness  R TKR  Clinical Impression  Pt s/p R TKR presents with decreased R LE strength/ROM and post op pain limiting functional mobility.  Pt will benefit from follow up rehab at SNF level to maximize IND and safety prior to return home alone.    Follow Up Recommendations SNF    Equipment Recommendations  None recommended by PT    Recommendations for Other Services OT consult     Precautions / Restrictions Precautions Precautions: Knee;Fall Required Braces or Orthoses: Knee Immobilizer - Right Knee Immobilizer - Right: Discontinue once straight leg raise with < 10 degree lag Restrictions Weight Bearing Restrictions: No Other Position/Activity Restrictions: WBAT      Mobility  Bed Mobility Overal bed mobility: Needs Assistance Bed Mobility: Supine to Sit     Supine to sit: Min assist;Mod assist     General bed mobility comments: cues for sequence and use of L LE to self assist  Transfers Overall transfer level: Needs assistance Equipment used: Rolling walker (2 wheeled) Transfers: Sit to/from Stand Sit to Stand: Mod assist;+2 physical assistance         General transfer comment: cues for LE management and use of UEs to self assist  Ambulation/Gait Ambulation/Gait assistance: Mod assist;+2 physical assistance Ambulation Distance (Feet): 23 Feet Assistive device: Rolling walker (2 wheeled) Gait Pattern/deviations: Step-to pattern;Decreased step length - right;Decreased step length - left;Shuffle;Trunk flexed Gait velocity: decr   General Gait Details: cues for sequence, posture and position from [employer]            Wheelchair Mobility    Modified Rankin (Stroke Patients Only)       Balance                                             Pertinent Vitals/Pain Pain Assessment:  0-10 Pain Score: 6  Pain Location: R knee Pain Descriptors / Indicators: Sore Pain Intervention(s): Limited activity within patient's tolerance;Monitored during session;Premedicated before session;Ice applied    Home Living Family/patient expects to be discharged to:: Skilled nursing facility Living Arrangements: Alone                    Prior Function Level of Independence: Independent               Hand Dominance   Dominant Hand: Right    Extremity/Trunk Assessment   Upper Extremity Assessment: Overall WFL for tasks assessed           Lower Extremity Assessment: RLE deficits/detail RLE Deficits / Details: 2/5 quads with AAROM at knee -10 - 80       Communication   Communication: No difficulties  Cognition Arousal/Alertness: Awake/alert Behavior During Therapy: WFL for tasks assessed/performed Overall Cognitive Status: Within Functional Limits for tasks assessed                      General Comments      Exercises Total Joint Exercises Ankle Circles/Pumps: AROM;Both;15 reps;Supine Quad Sets: AROM;Both;10 reps;Supine Heel Slides: AAROM;Right;15 reps;Supine Hip ABduction/ADduction: AAROM;Right;10 reps;Supine      Assessment/Plan    PT Assessment Patient needs continued PT services  PT Diagnosis Difficulty walking   PT Problem List Decreased strength;Decreased range of motion;Decreased activity tolerance;Decreased  mobility;Decreased knowledge of use of DME;Pain  PT Treatment Interventions DME instruction;Gait training;Stair training;Functional mobility training;Therapeutic activities;Therapeutic exercise;Patient/family education   PT Goals (Current goals can be found in the Care Plan section) Acute Rehab PT Goals Patient Stated Goal: Rehab and then home to resume previous lifestyle with decreased pain PT Goal Formulation: With patient Time For Goal Achievement: 06/26/14 Potential to Achieve Goals: Good    Frequency 7X/week    Barriers to discharge        Co-evaluation               End of Session Equipment Utilized During Treatment: Gait belt;Right knee immobilizer Activity Tolerance: Patient tolerated treatment well;Other (comment) (nausea) Patient left: in chair;with call bell/phone within reach Nurse Communication: Mobility status         Time: 5320-2334 PT Time Calculation (min): 29 min   Charges:   PT Evaluation $Initial PT Evaluation Tier I: 1 Procedure PT Treatments $Gait Training: 8-22 mins $Therapeutic Exercise: 8-22 mins   PT G Codes:          Apryll Hinkle 06/19/2014, 12:51 PM

## 2014-06-19 NOTE — Discharge Instructions (Addendum)
Walk with your walker. Weight bearing as tolerated Change dressing over the drain site daily until there is no more drainage. Do not remove Aquacel dressing unless it appears to be compromised. If a new dressing needs to be applied, just use gauze and paper tape. Shower only, no tub bath. Call if any temperatures greater than 101 or any wound complications: [phone] during the day and ask for Dr. Charlestine Night nurse, Brunilda Payor. Resume Plavix upon discharge from hospital, along with all vitamins and supplements

## 2014-06-19 NOTE — Progress Notes (Signed)
Clinical Social Work Department CLINICAL SOCIAL WORK PLACEMENT NOTE 06/19/2014  Patient:  [name], [name]  Account Number:  [id number] Admit date:  06/18/2014  Clinical Social Worker:  Werner Lean, LCSW  Date/time:  06/19/2014 09:08 AM  Clinical Social Work is seeking post-discharge placement for this patient at the following level of care:   SKILLED NURSING   (*CSW will update this form in Epic as items are completed)   06/19/2014  Patient/family provided with [place] Department of Clinical Social Work's list of facilities offering this level of care within the geographic area requested by the patient (or if unable, by the patient's family).    Patient/family informed of their freedom to choose among providers that offer the needed level of care, that participate in Medicare, Medicaid or managed care program needed by the patient, have an available bed and are willing to accept the patient.    Patient/family informed of [facility]' ownership interest in [facility], as well as of the fact that they are under no obligation to receive care at this facility.  PASARR submitted to EDS on 06/19/2014 PASARR number received on 06/19/2014  FL2 transmitted to all facilities in geographic area requested by pt/family on  06/19/2014 FL2 transmitted to all facilities within larger geographic area on   Patient informed that his/her managed care company has contracts with or will negotiate with  certain facilities, including the following:     Patient/family informed of bed offers received:   Patient chooses bed at  Physician recommends and patient chooses bed at    Patient to be transferred to  on   Patient to be transferred to facility by  Patient and family notified of transfer on  Name of family member notified:    The following physician request were entered in Epic:   Additional Comments:  Werner Lean LCSW [phone]

## 2014-06-19 NOTE — Progress Notes (Signed)
Physical Therapy Treatment Patient Details Name: [name] MRN: [id number] DOB: [birth year] Today's Date: 06/19/2014    History of Present Illness R TKR    PT Comments    Pt continues motivated but limited by nausea and dizziness with attempts to mobilize.  Follow Up Recommendations  SNF     Equipment Recommendations  None recommended by PT    Recommendations for Other Services OT consult     Precautions / Restrictions Precautions Precautions: Knee;Fall Required Braces or Orthoses: Knee Immobilizer - Right Knee Immobilizer - Right: Discontinue once straight leg raise with < 10 degree lag Restrictions Weight Bearing Restrictions: No Other Position/Activity Restrictions: WBAT    Mobility  Bed Mobility Overal bed mobility: Needs Assistance Bed Mobility: Sit to Supine       Sit to supine: Min assist;Mod assist   General bed mobility comments: cues for sequence and use of L LE to self assist  Transfers Overall transfer level: Needs assistance Equipment used: Rolling walker (2 wheeled) Transfers: Sit to/from Stand Sit to Stand: Mod assist;+2 physical assistance         General transfer comment: cues for LE management and use of UEs to self assist  Ambulation/Gait Ambulation/Gait assistance: Mod assist Ambulation Distance (Feet): 10 Feet (and 4) Assistive device: Rolling walker (2 wheeled) Gait Pattern/deviations: Step-to pattern;Decreased step length - right;Decreased step length - left;Shuffle;Trunk flexed Gait velocity: decr   General Gait Details: cues for sequence, posture and position from [employer]            Wheelchair Mobility    Modified Rankin (Stroke Patients Only)       Balance                                    Cognition Arousal/Alertness: Awake/alert Behavior During Therapy: WFL for tasks assessed/performed Overall Cognitive Status: Within Functional Limits for tasks assessed                       Exercises      General Comments        Pertinent Vitals/Pain Pain Assessment: 0-10 Pain Score: 5  Pain Location: R knee Pain Descriptors / Indicators: Sore Pain Intervention(s): Limited activity within patient's tolerance;Monitored during session;Premedicated before session;Ice applied    Home Living                      Prior Function            PT Goals (current goals can now be found in the care plan section) Acute Rehab PT Goals Patient Stated Goal: Rehab and then home to resume previous lifestyle with decreased pain PT Goal Formulation: With patient Time For Goal Achievement: 06/26/14 Potential to Achieve Goals: Good Progress towards PT goals: Progressing toward goals    Frequency  7X/week    PT Plan Current plan remains appropriate    Co-evaluation             End of Session Equipment Utilized During Treatment: Gait belt;Right knee immobilizer Activity Tolerance: Other (comment) (dizzy, nauseous) Patient left: in bed;with call bell/phone within reach;with family/visitor present     Time: 1425-1445 PT Time Calculation (min): 20 min  Charges:  $Gait Training: 8-22 mins                    G Codes:  Ramel Tobon 06/19/2014, 5:01 PM

## 2014-06-19 NOTE — Progress Notes (Signed)
   Subjective: 1 Day Post-Op Procedure(s) (LRB): RIGHT TOTAL KNEE ARTHROPLASTY (Right) Patient reports pain as moderate.   Patient seen in rounds with Dr. Gladstone Lighter. Patient is well, but has had some minor complaints of nausea/vomiting. She is feeling a little better this morning but is still having some mild nausea. No issues overnight other than the nausea and vomiting. No SOB or chest pain.  Plan is to go Skilled nursing facility after hospital stay.  Objective: Vital signs in last 24 hours: Temp:  [97.7 F (36.5 C)-98.2 F (36.8 C)] 98.1 F (36.7 C) (10/28 1937) Pulse Rate:  [64-95] 84 (10/28 0632) Resp:  [14-22] 18 (10/28 0632) BP: (113-165)/(62-86) 139/69 mmHg (10/28 0632) SpO2:  [94 %-100 %] 99 % (10/28 9024) Weight:  [60.782 kg (134 lb)] 60.782 kg (134 lb) (10/27 1610)  Intake/Output from previous day:  Intake/Output Summary (Last 24 hours) at 06/19/14 0741 Last data filed at 06/19/14 0639  Gross per 24 hour  Intake 3658.42 ml  Output   3050 ml  Net 608.42 ml     Labs:  Recent Labs  06/19/14 0432  HGB 11.6*    Recent Labs  06/19/14 0432  WBC 11.2*  RBC 4.14  HCT 34.9*  PLT 252    Recent Labs  06/19/14 0432  NA 140  K 4.3  CL 101  CO2 24  BUN 9  CREATININE 0.46*  GLUCOSE 198*  CALCIUM 9.0    EXAM General - Patient is Alert and Oriented Extremity - Neurologically intact Intact pulses distally Dorsiflexion/Plantar flexion intact Dressing - dressing C/D/I Motor Function - intact, moving foot and toes well on exam.  Hemovac pulled without difficulty.  Past Medical History  Diagnosis Date  . Coronary artery disease   . Hyperlipidemia   . History of rheumatic fever   . Anemia   . OSTEOPENIA   . Insomnia   . COLONIC POLYPS, HX OF   . Hypothyroidism   . Hypertension   . Gastroesophageal reflux disease   . PVD (peripheral vascular disease) 10/31/2011  . Vitamin D deficiency   . RSD (reflex sympathetic dystrophy) 10/31/2011  . PAD  (peripheral artery disease) 12/03/2011    status post right SFA [place] atherectomy  . COPD (chronic obstructive pulmonary disease)     no per pt on 03/21/2014  . Osteoarthritis   . DISC DISEASE, CERVICAL   . Arthritis     "my whole body"  . Heart murmur     as a child  . Anxiety     hx    Assessment/Plan: 1 Day Post-Op Procedure(s) (LRB): RIGHT TOTAL KNEE ARTHROPLASTY (Right) Active Problems:   Total knee replacement status  Estimated body mass index is 23.74 kg/(m^2) as calculated from the following:   Height as of this encounter: 5\' 3"  (1.6 m).   Weight as of this encounter: 60.782 kg (134 lb). Advance diet Up with therapy D/C IV fluids when tolerating POs well  DVT Prophylaxis - Xarelto Weight-Bearing as tolerated   Overall she is doing well in regards to her pain control. Seems to be moving past issues with nausea. Encouraged fluids and something light for breakfast. Will have her walk with therapy today. If she progresses well, plan for [facility] tomorrow.   Alicia Jourdain, PA-C Orthopaedic Surgery 06/19/2014, 7:41 AM

## 2014-06-19 NOTE — Progress Notes (Signed)
OT Cancellation Note  Patient Details Name: [name] MRN: [id number] DOB: [birth year]   Cancelled Treatment:    Reason Eval/Treat Not Completed: Other (comment).  Pt limited by nausea and dizziness.  Will check back tomorrow.    Danon Lograsso 06/19/2014, 2:54 PM Lesle Chris, OTR/L [phone] 06/19/2014

## 2014-06-19 NOTE — Care Management Note (Signed)
    Page 1 of 1   06/19/2014     10:49:32 AM CARE MANAGEMENT NOTE 06/19/2014  Patient:  [name], [name]   Account Number:  [id number]  Date Initiated:  06/19/2014  Documentation initiated by:  [facility]  Subjective/Objective Assessment:   adm: RIGHT TOTAL KNEE ARTHROPLASTY (Right     Action/Plan:   discharge planning   Anticipated DC Date:  06/19/2014   Anticipated DC Plan:  SKILLED NURSING FACILITY         Choice offered to / List presented to:             Status of service:  Completed, signed off Medicare Important Message given?   (If response is "NO", the following Medicare IM given date fields will be blank) Date Medicare IM given:   Medicare IM given by:   Date Additional Medicare IM given:   Additional Medicare IM given by:    Discharge Disposition:    Per UR Regulation:    If discussed at Long Length of Stay Meetings, dates discussed:    Comments:  06/19/14 10:45 CM notes pt to go to SNF for rehab; CSW arranging.  No other CM needs were communicated.  Mariane Masters, BSN, CM [phone].

## 2014-06-20 ENCOUNTER — Encounter: Payer: Self-pay | Admitting: Orthopedic Surgery

## 2014-06-20 LAB — BASIC METABOLIC PANEL
Anion gap: 11 (ref 5–15)
BUN: 17 mg/dL (ref 6–23)
CO2: 29 mEq/L (ref 19–32)
Calcium: 9 mg/dL (ref 8.4–10.5)
Chloride: 101 mEq/L (ref 96–112)
Creatinine, Ser: 0.62 mg/dL (ref 0.50–1.10)
GFR calc Af Amer: >90 mL/min (ref >90)
GFR calc non Af Amer: 89 mL/min — ABNORMAL LOW (ref >90)
Glucose, Bld: 152 mg/dL — ABNORMAL HIGH (ref 70–99)
Potassium: 3.4 mEq/L — ABNORMAL LOW (ref 3.7–5.3)
Sodium: 141 mEq/L (ref 137–147)

## 2014-06-20 LAB — CBC
HCT: 29.7 % — ABNORMAL LOW (ref 36.0–46.0)
Hemoglobin: 9.6 g/dL — ABNORMAL LOW (ref 12.0–15.0)
MCH: 27.7 pg (ref 26.0–34.0)
MCHC: 32.3 g/dL (ref 30.0–36.0)
MCV: 85.8 fL (ref 78.0–100.0)
Platelets: 216 10*3/uL (ref 150–400)
RBC: 3.46 MIL/uL — ABNORMAL LOW (ref 3.87–5.11)
RDW: 14.3 % (ref 11.5–15.5)
WBC: 9.5 10*3/uL (ref 4.0–10.5)

## 2014-06-20 MED ORDER — DSS 100 MG PO CAPS: 100.0000 mg | ORAL_CAPSULE | Freq: Two times a day (BID) | ORAL | Status: DC

## 2014-06-20 MED ORDER — FERROUS SULFATE 325 (65 FE) MG PO TABS: 325.0000 mg | ORAL_TABLET | Freq: Three times a day (TID) | ORAL | Status: DC

## 2014-06-20 MED ORDER — METHOCARBAMOL 500 MG PO TABS: 500.0000 mg | ORAL_TABLET | Freq: Four times a day (QID) | ORAL | Status: DC | PRN

## 2014-06-20 MED ORDER — ONDANSETRON HCL 4 MG PO TABS: 4.0000 mg | ORAL_TABLET | Freq: Four times a day (QID) | ORAL | Status: DC | PRN

## 2014-06-20 MED ORDER — PROMETHAZINE HCL 25 MG PO TABS
12.5000 mg | ORAL_TABLET | Freq: Four times a day (QID) | ORAL | Status: DC | PRN
  Administered 2014-06-20 – 2014-06-21 (×2): 12.5 mg via ORAL
  Filled 2014-06-20 (×2): qty 1

## 2014-06-20 MED ORDER — OXYCODONE-ACETAMINOPHEN 5-325 MG PO TABS: 1.0000 | ORAL_TABLET | ORAL | Status: DC | PRN

## 2014-06-20 MED ORDER — DOCUSATE SODIUM 100 MG PO CAPS
100.0000 mg | ORAL_CAPSULE | Freq: Two times a day (BID) | ORAL | Status: DC
  Administered 2014-06-20 – 2014-06-21 (×3): 100 mg via ORAL

## 2014-06-20 NOTE — Progress Notes (Signed)
Physical Therapy Treatment Patient Details Name: [name] MRN: [id number] DOB: 1946/02/18 Today's Date: 06/20/2014    History of Present Illness R TKR    PT Comments    Pt continues motivated but limited by nausea with attempts to mobilize  Follow Up Recommendations  SNF     Equipment Recommendations  None recommended by PT    Recommendations for Other Services OT consult     Precautions / Restrictions Precautions Precautions: Knee;Fall Required Braces or Orthoses: Knee Immobilizer - Right Knee Immobilizer - Right: Discontinue once straight leg raise with < 10 degree lag Restrictions Weight Bearing Restrictions: No Other Position/Activity Restrictions: WBAT    Mobility  Bed Mobility Overal bed mobility: Needs Assistance Bed Mobility: Supine to Sit     Supine to sit: Min assist Sit to supine: Min assist   General bed mobility comments: assist for LLE  Transfers Overall transfer level: Needs assistance Equipment used: Rolling walker (2 wheeled) Transfers: Sit to/from Stand Sit to Stand: Min assist         General transfer comment: cues for LE management and use of UEs to self assist  Ambulation/Gait Ambulation/Gait assistance: Min assist;Mod assist Ambulation Distance (Feet): 18 Feet (and 7) Assistive device: Rolling walker (2 wheeled) Gait Pattern/deviations: Step-to pattern;Decreased step length - right;Decreased step length - left;Shuffle;Trunk flexed;Antalgic Gait velocity: decr   General Gait Details: cues for sequence, posture and position from [employer]            Wheelchair Mobility    Modified Rankin (Stroke Patients Only)       Balance                                    Cognition Arousal/Alertness: Awake/alert Behavior During Therapy: WFL for tasks assessed/performed Overall Cognitive Status: Within Functional Limits for tasks assessed                      Exercises Total Joint  Exercises Ankle Circles/Pumps: AROM;Both;15 reps;Supine Quad Sets: AROM;Both;Supine;15 reps Heel Slides: AAROM;Right;15 reps;Supine Straight Leg Raises: AAROM;Right;15 reps;Supine Goniometric ROM: AAROM at R knee - 12 - 60    General Comments        Pertinent Vitals/Pain Pain Assessment: 0-10 Pain Score: 5  Pain Location: R knee Pain Descriptors / Indicators: Aching;Sore Pain Intervention(s): Limited activity within patient's tolerance;Monitored during session;Premedicated before session;Ice applied    Home Living Family/patient expects to be discharged to:: Skilled nursing facility Living Arrangements: Alone                  Prior Function Level of Independence: Independent          PT Goals (current goals can now be found in the care plan section) Acute Rehab PT Goals Patient Stated Goal: Rehab and then home to resume previous lifestyle with decreased pain PT Goal Formulation: With patient Time For Goal Achievement: 06/26/14 Potential to Achieve Goals: Good Progress towards PT goals: Progressing toward goals    Frequency  7X/week    PT Plan Current plan remains appropriate    Co-evaluation             End of Session Equipment Utilized During Treatment: Gait belt;Right knee immobilizer Activity Tolerance: Other (comment) (nausea) Patient left: with call bell/phone within reach;with family/visitor present;in chair     Time: 0932-6712 PT Time Calculation (min): 32 min  Charges:  $Gait Training:  8-22 mins $Therapeutic Exercise: 8-22 mins                    G Codes:      Charliee Krenz 2014-06-26, 12:54 PM

## 2014-06-20 NOTE — Evaluation (Signed)
Occupational Therapy Evaluation Patient Details Name: [name] MRN: [id number] DOB: [birth year] Today's Date: 06/20/2014    History of Present Illness R TKR   Clinical Impression   This 68 year old female was admitted for the above surgery.  She will benefit from continued OT for increased safety and independence with adls.  Pt was limited by nausea after standing.  Pt was independent prior to admission, and she needs min A for ADLs at this time.  Unable to tolerate ambulating to bathroom due to nausea.    Follow Up Recommendations  SNF    Equipment Recommendations  3 in 1 bedside comode    Recommendations for Other Services       Precautions / Restrictions Precautions Precautions: Knee;Fall Knee Immobilizer - Right: Discontinue once straight leg raise with < 10 degree lag Restrictions Weight Bearing Restrictions: No      Mobility Bed Mobility           Sit to supine: Min assist   General bed mobility comments: assist for LLE  Transfers   Equipment used: Rolling walker (2 wheeled) Transfers: Sit to/from Stand Sit to Stand: Min assist         General transfer comment: cues for safety; walker    Balance                                            ADL Overall ADL's : Needs assistance/impaired                                       General ADL Comments: Pt completed ADL from EOB; min A for LB and set up for UB.  Pt limited by nausea after standing.  Repositioned in bed.  Pt attempted to stand without walker twice; cued for safety  Did not introduce AE on this visit.     Vision                     Perception     Praxis      Pertinent Vitals/Pain Pain Score: 8  Pain Location: R knee Pain Descriptors / Indicators: Aching;Sore Pain Intervention(s): Limited activity within patient's tolerance;Monitored during session;Premedicated before session;Repositioned;Ice applied     Hand Dominance      Extremity/Trunk Assessment Upper Extremity Assessment Upper Extremity Assessment: Overall WFL for tasks assessed           Communication Communication Communication: No difficulties   Cognition Arousal/Alertness: Awake/alert Behavior During Therapy: WFL for tasks assessed/performed Overall Cognitive Status: Within Functional Limits for tasks assessed                     General Comments       Exercises       Shoulder Instructions      Home Living Family/patient expects to be discharged to:: Skilled nursing facility Living Arrangements: Alone                                      Prior Functioning/Environment Level of Independence: Independent             OT Diagnosis: Generalized weakness;Acute pain   OT Problem List: Decreased strength;Decreased activity tolerance;Decreased  safety awareness;Decreased knowledge of use of DME or AE;Pain   OT Treatment/Interventions: Self-care/ADL training;DME and/or AE instruction;Patient/family education    OT Goals(Current goals can be found in the care plan section) Acute Rehab OT Goals Patient Stated Goal: Rehab and then home to resume previous lifestyle with decreased pain OT Goal Formulation: With patient Time For Goal Achievement: 06/27/14 Potential to Achieve Goals: Good ADL Goals Pt Will Perform Lower Body Bathing: with min guard assist;with adaptive equipment;sit to/from stand Pt Will Transfer to Toilet: with min assist;ambulating;bedside commode Pt Will Perform Toileting - Clothing Manipulation and hygiene: with min guard assist;sit to/from stand Additional ADL Goal #1: pt will not need any safety cues during adls/toilet transfers  OT Frequency: Min 2X/week   Barriers to D/C: Decreased caregiver support          Co-evaluation              End of Session Nurse Communication:  (nausea--pt did not want meds yet)  Activity Tolerance:  (limited by nausea) Patient left: in bed;with  call bell/phone within reach;with bed alarm set   Time: 9038-3338 OT Time Calculation (min): 17 min Charges:  OT General Charges $OT Visit: 1 Procedure OT Evaluation $Initial OT Evaluation Tier I: 1 Procedure OT Treatments $Self Care/Home Management : 8-22 mins G-Codes:    Teneil Shiller 06/22/2014, 9:25 AM Lesle Chris, OTR/L [phone] Jun 22, 2014

## 2014-06-20 NOTE — Clinical Documentation Improvement (Signed)
Md's, NP's, and PA's  Documentation of "osteoarthritis" for greater specificity please add type if appropriate.  Thank you  Possible Conditions  Primary  Secondary  Post- traumatic  Other  Not able to determine  Thank you,  Ree Kida ,RN Clinical Documentation Specialist:  [employer]

## 2014-06-20 NOTE — Progress Notes (Signed)
Subjective: 2 Days Post-Op Procedure(s) (LRB): RIGHT TOTAL KNEE ARTHROPLASTY (Right) Patient reports pain as mild.   Patient seen in rounds without Dr. Gladstone Lighter.  Patient is well, and has had no acute complaints or problems. She does continue to have some nausea but feels that it is resolving. She is somewhat discouraged with how therapy has gone. She would have liked to been able to walk more. No issues overnight. No SOB or chest pain. Voiding well. Minimal flatus. No abdominal discomfort.  Plan is to go Skilled nursing facility after hospital stay.  Objective: Vital signs in last 24 hours: Temp:  [97.9 F (36.6 C)-98.6 F (37 C)] 97.9 F (36.6 C) (10/29 0530) Pulse Rate:  [94-99] 94 (10/29 0530) Resp:  [16-20] 20 (10/29 0530) BP: (118-141)/(53-70) 124/57 mmHg (10/29 0530) SpO2:  [91 %-96 %] 95 % (10/29 0530)  Intake/Output from previous day:  Intake/Output Summary (Last 24 hours) at 06/20/14 0741 Last data filed at 06/20/14 0530  Gross per 24 hour  Intake   2010 ml  Output   2650 ml  Net   -640 ml     Labs:  Recent Labs  06/19/14 0432 06/20/14 0418  HGB 11.6* 9.6*    Recent Labs  06/19/14 0432 06/20/14 0418  WBC 11.2* 9.5  RBC 4.14 3.46*  HCT 34.9* 29.7*  PLT 252 216    Recent Labs  06/19/14 0432 06/20/14 0418  NA 140 141  K 4.3 3.4*  CL 101 101  CO2 24 29  BUN 9 17  CREATININE 0.46* 0.62  GLUCOSE 198* 152*  CALCIUM 9.0 9.0    EXAM General - Patient is Alert and Oriented Extremity - Neurologically intact Intact pulses distally Dorsiflexion/Plantar flexion intact No cellulitis present Compartment soft Dressing/Incision - clean, dry, no drainage Motor Function - intact, moving foot and toes well on exam.  Abdomen soft, nontender, no distention.   Past Medical History  Diagnosis Date  . Coronary artery disease   . Hyperlipidemia   . History of rheumatic fever   . Anemia   . OSTEOPENIA   . Insomnia   . COLONIC POLYPS, HX OF   .  Hypothyroidism   . Hypertension   . Gastroesophageal reflux disease   . PVD (peripheral vascular disease) 10/31/2011  . Vitamin D deficiency   . RSD (reflex sympathetic dystrophy) 10/31/2011  . PAD (peripheral artery disease) 12/03/2011    status post right SFA [place] atherectomy  . COPD (chronic obstructive pulmonary disease)     no per pt on 03/21/2014  . Osteoarthritis   . DISC DISEASE, CERVICAL   . Arthritis     "my whole body"  . Heart murmur     as a child  . Anxiety     hx    Assessment/Plan: 2 Days Post-Op Procedure(s) (LRB): RIGHT TOTAL KNEE ARTHROPLASTY (Right) Active Problems:   Total knee replacement status  Estimated body mass index is 23.74 kg/(m^2) as calculated from the following:   Height as of this encounter: 5\' 3"  (1.6 m).   Weight as of this encounter: 60.782 kg (134 lb). Advance diet Up with therapy D/C IV fluids Plan for discharge tomorrow to SNF  DVT Prophylaxis - Xarelto Weight-Bearing as tolerated  She is doing fair. Will continue therapy in house today and plan for discharge to SNF tomorrow. Will adjust some meds to try to completely resolve nausea and to encourage bowels.   Ardeen Jourdain, PA-C Orthopaedic Surgery 06/20/2014, 7:41 AM

## 2014-06-20 NOTE — Progress Notes (Signed)
Physical Therapy Treatment Patient Details Name: [name] MRN: [id number] DOB: 01-06-1946 Today's Date: 10-Jul-2014    History of Present Illness R TKR    PT Comments    Improvement in activity tolerance but pt continues ltd by nausea and fatigue  Follow Up Recommendations  SNF     Equipment Recommendations  None recommended by PT    Recommendations for Other Services OT consult     Precautions / Restrictions Precautions Precautions: Knee;Fall Required Braces or Orthoses: Knee Immobilizer - Right Knee Immobilizer - Right: Discontinue once straight leg raise with < 10 degree lag Restrictions Weight Bearing Restrictions: No Other Position/Activity Restrictions: WBAT    Mobility  Bed Mobility Overal bed mobility: Needs Assistance Bed Mobility: Sit to Supine       Sit to supine: Min assist   General bed mobility comments: assist for LLE  Transfers Overall transfer level: Needs assistance Equipment used: Rolling walker (2 wheeled) Transfers: Sit to/from Stand Sit to Stand: Min assist         General transfer comment: cues for LE management and use of UEs to self assist  Ambulation/Gait Ambulation/Gait assistance: Min assist;Mod assist Ambulation Distance (Feet): 44 Feet (and 18 to bathroom) Assistive device: Rolling walker (2 wheeled) Gait Pattern/deviations: Step-to pattern;Decreased step length - right;Decreased step length - left;Shuffle;Trunk flexed Gait velocity: decr   General Gait Details: cues for sequence, posture and position from [employer]            Wheelchair Mobility    Modified Rankin (Stroke Patients Only)       Balance                                    Cognition Arousal/Alertness: Awake/alert Behavior During Therapy: WFL for tasks assessed/performed Overall Cognitive Status: Within Functional Limits for tasks assessed                      Exercises      General Comments         Pertinent Vitals/Pain Pain Assessment: 0-10 Pain Score: 6  Pain Location: R knee Pain Descriptors / Indicators: Aching;Sore Pain Intervention(s): Limited activity within patient's tolerance;Monitored during session;Ice applied;Premedicated before session    Home Living                      Prior Function            PT Goals (current goals can now be found in the care plan section) Acute Rehab PT Goals Patient Stated Goal: Rehab and then home to resume previous lifestyle with decreased pain PT Goal Formulation: With patient Time For Goal Achievement: 06/26/14 Potential to Achieve Goals: Good Progress towards PT goals: Progressing toward goals    Frequency  7X/week    PT Plan Current plan remains appropriate    Co-evaluation             End of Session Equipment Utilized During Treatment: Gait belt;Right knee immobilizer Activity Tolerance: Other (comment);Patient limited by fatigue (nausea) Patient left: with call bell/phone within reach;in bed     Time: 1430-1454 PT Time Calculation (min): 24 min  Charges:  $Gait Training: 23-37 mins                    G Codes:      Jann Milkovich Jul 10, 2014, 4:15 PM

## 2014-06-20 NOTE — Discharge Summary (Signed)
Physician Discharge Summary   Patient ID: [name] MRN: [id number] DOB/AGE: [birth year] 68 y.o.  Admit date: 06/18/2014 Discharge date: 06/21/2014  Primary Diagnosis: Primary osteoarthritis, right knee  Admission Diagnoses:  Past Medical History  Diagnosis Date  . Coronary artery disease   . Hyperlipidemia   . History of rheumatic fever   . Anemia   . OSTEOPENIA   . Insomnia   . COLONIC POLYPS, HX OF   . Hypothyroidism   . Hypertension   . Gastroesophageal reflux disease   . PVD (peripheral vascular disease) 10/31/2011  . Vitamin D deficiency   . RSD (reflex sympathetic dystrophy) 10/31/2011  . PAD (peripheral artery disease) 12/03/2011    status post right SFA [place]bo [place] atherectomy  . COPD (chronic obstructive pulmonary disease)     no per pt on 03/21/2014  . Osteoarthritis   . DISC DISEASE, CERVICAL   . Arthritis     "my whole body"  . Heart murmur     as a child  . Anxiety     hx   Discharge Diagnoses:   Active Problems:   Total knee replacement status  Estimated body mass index is 23.74 kg/(m^2) as calculated from the following:   Height as of this encounter: 5' 3" (1.6 m).   Weight as of this encounter: 60.782 kg (134 lb).  Procedure:  Procedure(s) (LRB): RIGHT TOTAL KNEE ARTHROPLASTY (Right)   Consults: None  HPI: [name], 68 y.o. female, has a history of pain and functional disability in the right knee due to arthritis and has failed non-surgical conservative treatments for greater than 12 weeks to includeNSAID's and/or analgesics, corticosteriod injections, viscosupplementation injections, use of assistive devices and activity modification. Onset of symptoms was gradual, starting 3 years ago with gradually worsening course since that time. The patient noted no past surgery on the right knee(s). [name] Patient has night pain, worsening of pain with activity and weight  bearing, pain that interferes with activities of daily living, pain with passive range of motion, crepitus and joint swelling. Patient has evidence of periarticular osteophytes and joint space narrowing by imaging studies. There is no active infection.   Laboratory Data: Admission on 06/18/2014  Component Date Value Ref Range Status  . ABO/RH(D) 06/18/2014 B POS   Final  . Antibody Screen 06/18/2014 NEG   Final  . Sample Expiration 06/18/2014 06/21/2014   Final  . ABO/RH(D) 06/18/2014 B POS   Final  . WBC 06/19/2014 11.2* 4.0 - 10.5 K/uL Final  . RBC 06/19/2014 4.14  3.87 - 5.11 MIL/uL Final  . Hemoglobin 06/19/2014 11.6* 12.0 - 15.0 g/dL Final  . HCT 06/19/2014 34.9* 36.0 - 46.0 % Final  . MCV 06/19/2014 84.3  78.0 - 100.0 fL Final  . MCH 06/19/2014 28.0  26.0 - 34.0 pg Final  . MCHC 06/19/2014 33.2  30.0 - 36.0 g/dL Final  . RDW 06/19/2014 13.9  11.5 - 15.5 % Final  . Platelets 06/19/2014 252  150 - 400 K/uL Final  . Sodium 06/19/2014 140  137 - 147 mEq/L Final  . Potassium 06/19/2014 4.3  3.7 - 5.3 mEq/L Final  . Chloride 06/19/2014 101  96 - 112 mEq/L Final  . CO2 06/19/2014 24  19 - 32 mEq/L Final  . Glucose, Bld 06/19/2014 198* 70 - 99 mg/dL Final  . BUN 06/19/2014 9  6 - 23 mg/dL Final  . Creatinine, Ser  06/19/2014 0.46* 0.50 - 1.10 mg/dL Final  . Calcium 06/19/2014 9.0  8.4 - 10.5 mg/dL Final  . GFR calc non Af Amer 06/19/2014 >90  >90 mL/min Final  . GFR calc Af Amer 06/19/2014 >90  >90 mL/min Final   Comment: (NOTE)                          The eGFR has been calculated using the CKD EPI equation.                          This calculation has not been validated in all clinical situations.                          eGFR's persistently <90 mL/min signify possible Chronic Kidney                          Disease.  . Anion gap 06/19/2014 15  5 - 15 Final  . WBC 06/20/2014 9.5  4.0 - 10.5 K/uL Final  . RBC 06/20/2014 3.46* 3.87 - 5.11 MIL/uL Final  . Hemoglobin 06/20/2014 9.6*  12.0 - 15.0 g/dL Final   Comment: DELTA CHECK NOTED                          REPEATED TO VERIFY  . HCT 06/20/2014 29.7* 36.0 - 46.0 % Final  . MCV 06/20/2014 85.8  78.0 - 100.0 fL Final  . MCH 06/20/2014 27.7  26.0 - 34.0 pg Final  . MCHC 06/20/2014 32.3  30.0 - 36.0 g/dL Final  . RDW 06/20/2014 14.3  11.5 - 15.5 % Final  . Platelets 06/20/2014 216  150 - 400 K/uL Final  . Sodium 06/20/2014 141  137 - 147 mEq/L Final  . Potassium 06/20/2014 3.4* 3.7 - 5.3 mEq/L Final   Comment: DELTA CHECK NOTED                          REPEATED TO VERIFY  . Chloride 06/20/2014 101  96 - 112 mEq/L Final  . CO2 06/20/2014 29  19 - 32 mEq/L Final  . Glucose, Bld 06/20/2014 152* 70 - 99 mg/dL Final  . BUN 06/20/2014 17  6 - 23 mg/dL Final  . Creatinine, Ser 06/20/2014 0.62  0.50 - 1.10 mg/dL Final  . Calcium 06/20/2014 9.0  8.4 - 10.5 mg/dL Final  . GFR calc non Af Amer 06/20/2014 89* >90 mL/min Final  . GFR calc Af Amer 06/20/2014 >90  >90 mL/min Final   Comment: (NOTE)                          The eGFR has been calculated using the CKD EPI equation.                          This calculation has not been validated in all clinical situations.                          eGFR's persistently <90 mL/min signify possible Chronic Kidney                          Disease.  Georgiann Hahn  gap 06/20/2014 11  5 - 15 Final  Hospital Outpatient Visit on 06/11/2014  Component Date Value Ref Range Status  . aPTT 06/11/2014 25  24 - 37 seconds Final  . Sodium 06/11/2014 145  137 - 147 mEq/L Final  . Potassium 06/11/2014 4.1  3.7 - 5.3 mEq/L Final  . Chloride 06/11/2014 105  96 - 112 mEq/L Final  . CO2 06/11/2014 26  19 - 32 mEq/L Final  . Glucose, Bld 06/11/2014 91  70 - 99 mg/dL Final  . BUN 06/11/2014 14  6 - 23 mg/dL Final  . Creatinine, Ser 06/11/2014 0.59  0.50 - 1.10 mg/dL Final  . Calcium 06/11/2014 9.9  8.4 - 10.5 mg/dL Final  . Total Protein 06/11/2014 7.4  6.0 - 8.3 g/dL Final  . Albumin 06/11/2014 3.8  3.5 -  5.2 g/dL Final  . AST 06/11/2014 16  0 - 37 U/L Final  . ALT 06/11/2014 17  0 - 35 U/L Final  . Alkaline Phosphatase 06/11/2014 91  39 - 117 U/L Final  . Total Bilirubin 06/11/2014 0.4  0.3 - 1.2 mg/dL Final  . GFR calc non Af Amer 06/11/2014 >90  >90 mL/min Final  . GFR calc Af Amer 06/11/2014 >90  >90 mL/min Final   Comment: (NOTE)                          The eGFR has been calculated using the CKD EPI equation.                          This calculation has not been validated in all clinical situations.                          eGFR's persistently <90 mL/min signify possible Chronic Kidney                          Disease.  . Anion gap 06/11/2014 14  5 - 15 Final  . Prothrombin Time 06/11/2014 13.3  11.6 - 15.2 seconds Final  . INR 06/11/2014 1.00  0.00 - 1.49 Final  . Color, Urine 06/11/2014 YELLOW  YELLOW Final  . APPearance 06/11/2014 CLEAR  CLEAR Final  . Specific Gravity, Urine 06/11/2014 1.020  1.005 - 1.030 Final  . pH 06/11/2014 6.0  5.0 - 8.0 Final  . Glucose, UA 06/11/2014 NEGATIVE  NEGATIVE mg/dL Final  . Hgb urine dipstick 06/11/2014 NEGATIVE  NEGATIVE Final  . Bilirubin Urine 06/11/2014 NEGATIVE  NEGATIVE Final  . Ketones, ur 06/11/2014 NEGATIVE  NEGATIVE mg/dL Final  . Protein, ur 06/11/2014 NEGATIVE  NEGATIVE mg/dL Final  . Urobilinogen, UA 06/11/2014 1.0  0.0 - 1.0 mg/dL Final  . Nitrite 06/11/2014 NEGATIVE  NEGATIVE Final  . Leukocytes, UA 06/11/2014 NEGATIVE  NEGATIVE Final   MICROSCOPIC NOT DONE ON URINES WITH NEGATIVE PROTEIN, BLOOD, LEUKOCYTES, NITRITE, OR GLUCOSE <1000 mg/dL.  . WBC 06/11/2014 5.5  4.0 - 10.5 K/uL Final  . RBC 06/11/2014 4.86  3.87 - 5.11 MIL/uL Final  . Hemoglobin 06/11/2014 13.7  12.0 - 15.0 g/dL Final  . HCT 06/11/2014 42.0  36.0 - 46.0 % Final  . MCV 06/11/2014 86.4  78.0 - 100.0 fL Final  . MCH 06/11/2014 28.2  26.0 - 34.0 pg Final  . MCHC 06/11/2014 32.6  30.0 - 36.0 g/dL Final  . RDW 06/11/2014 14.2  11.5 - 15.5 % Final  . Platelets  06/11/2014 256  150 - 400 K/uL Final  . MRSA, PCR 06/11/2014 NEGATIVE  NEGATIVE Final  . Staphylococcus aureus 06/11/2014 NEGATIVE  NEGATIVE Final   Comment:                                 The Xpert SA Assay (FDA                          approved for NASAL specimens                          in patients over 36 years of age),                          is one component of                          a comprehensive surveillance                          program.  Test performance has                          been validated by [employer] for patients greater                          than or equal to 67 year old.                          It is not intended                          to diagnose infection nor to                          guide or monitor treatment.  Office Visit on 06/10/2014  Component Date Value Ref Range Status  . WBC 06/10/2014 6.3  4.0 - 10.5 K/uL Final  . RBC 06/10/2014 4.63  3.87 - 5.11 Mil/uL Final  . Hemoglobin 06/10/2014 12.8  12.0 - 15.0 g/dL Final  . HCT 06/10/2014 39.8  36.0 - 46.0 % Final  . MCV 06/10/2014 85.9  78.0 - 100.0 fl Final  . MCHC 06/10/2014 32.2  30.0 - 36.0 g/dL Final  . RDW 06/10/2014 15.3  11.5 - 15.5 % Final  . Platelets 06/10/2014 258.0  150.0 - 400.0 K/uL Final  . Neutrophils Relative % 06/10/2014 58.4  43.0 - 77.0 % Final  . Lymphocytes Relative 06/10/2014 30.4  12.0 - 46.0 % Final  . Monocytes Relative 06/10/2014 7.8  3.0 - 12.0 % Final  . Eosinophils Relative 06/10/2014 3.0  0.0 - 5.0 % Final  . Basophils Relative 06/10/2014 0.4  0.0 - 3.0 % Final  . Neutro Abs 06/10/2014 3.7  1.4 - 7.7 K/uL Final  . Lymphs Abs 06/10/2014 1.9  0.7 - 4.0 K/uL Final  . Monocytes Absolute 06/10/2014 0.5  0.1 - 1.0 K/uL Final  .  Eosinophils Absolute 06/10/2014 0.2  0.0 - 0.7 K/uL Final  . Basophils Absolute 06/10/2014 0.0  0.0 - 0.1 K/uL Final     EKG: Orders placed in visit on 05/29/14  . EKG 12-LEAD     Hospital Course: [name] is a 68 y.o. who was admitted to [facility]. They were brought to the operating room on 06/18/2014 and underwent Procedure(s): RIGHT TOTAL KNEE ARTHROPLASTY.  Patient tolerated the procedure well and was later transferred to the recovery room and then to the orthopaedic floor for postoperative care.  They were given PO and IV analgesics for pain control following their surgery.  They were given 24 hours of postoperative antibiotics of  Anti-infectives   Start     Dose/Rate Route Frequency Ordered Stop   06/18/14 1830  ceFAZolin (ANCEF) IVPB 1 g/50 mL premix     1 g 100 mL/hr over 30 Minutes Intravenous Every 6 hours 06/18/14 1622 06/19/14 0200   06/18/14 1315  polymyxin B 500,000 Units, bacitracin 50,000 Units in sodium chloride irrigation 0.9 % 500 mL irrigation  Status:  Discontinued       As needed 06/18/14 1315 06/18/14 1445   06/18/14 1017  ceFAZolin (ANCEF) IVPB 2 g/50 mL premix     2 g 100 mL/hr over 30 Minutes Intravenous On call to O.R. 06/18/14 1017 06/18/14 1248     and started on DVT prophylaxis in the form of Xarelto.   PT and OT were ordered for total joint protocol.  Discharge planning consulted to help with postop disposition and equipment needs.  Patient had a difficult night on the evening of surgery due to nausea and vomiting.  They started to get up OOB with therapy on day one. Hemovac drain was pulled without difficulty. Progressed slowly with therapy due to complaints of nausea  Continued to work with therapy into day two.  By day three, the patient had progressed with therapy and meeting their goals. Complications of nausea much improved. Patient was seen in rounds and was ready to go to rehab. Plan for patient to resume Plavix/Aspirin and discontinue use of Xarelto upon discharge to SNF.   Diet: Cardiac diet Activity:WBAT Follow-up:in 2 weeks Disposition - Skilled nursing facility Discharged Condition: stable   Discharge Instructions   Call MD /  Call 911    Complete by:  As directed   If you experience chest pain or shortness of breath, CALL 911 and be transported to the hospital emergency room.  If you develope a fever above 101 F, pus (white drainage) or increased drainage or redness at the wound, or calf pain, call your surgeon's office.     Constipation Prevention    Complete by:  As directed   Drink plenty of fluids.  Prune juice may be helpful.  You may use a stool softener, such as Colace (over the counter) 100 mg twice a day.  Use MiraLax (over the counter) for constipation as needed.     Diet - low sodium heart healthy    Complete by:  As directed      Discharge instructions    Complete by:  As directed   Walk with your walker. Weight bearing as tolerated Change dressing over the drain site daily until there is no more drainage. Do not remove Aquacel dressing unless it appears to be compromised. If a new dressing needs to be applied, just use gauze and paper tape. Shower only, no tub bath. Call if any temperatures greater  than 101 or any wound complications: [phone] during the day and ask for Dr. Charlestine Night nurse, Brunilda Payor. Resume Plavix upon discharge from hospital, along with all vitamins and supplements     Do not put a pillow under the knee. Place it under the heel.    Complete by:  As directed      Driving restrictions    Complete by:  As directed   No driving     Increase activity slowly as tolerated    Complete by:  As directed             Medication List         amLODipine 10 MG tablet  Commonly known as:  NORVASC  Take 10 mg by mouth at bedtime.     aspirin EC 81 MG tablet  Take 81 mg by mouth every morning.     BC HEADACHE POWDER PO  Take 1 packet by mouth 3 (three) times daily as needed (headache).     CALCIUM 1000 + D PO  Take 1 tablet by mouth daily at 2 PM daily at 2 PM.     clopidogrel 75 MG tablet  Commonly known as:  PLAVIX  Take 1 tablet (75 mg total) by mouth daily with  breakfast.     COD LIVER OIL PO  Take 1 tablet by mouth every morning.     DSS 100 MG Caps  Take 100 mg by mouth 2 (two) times daily.     ferrous sulfate 325 (65 FE) MG tablet  Take 1 tablet (325 mg total) by mouth 3 (three) times daily after meals.     FISH OIL PO  Take 1 tablet by mouth every morning.     hydrochlorothiazide 25 MG tablet  Commonly known as:  HYDRODIURIL  Take 25 mg by mouth daily after lunch.     levothyroxine 112 MCG tablet  Commonly known as:  SYNTHROID, LEVOTHROID  Take 112 mcg by mouth daily before breakfast.     methocarbamol 500 MG tablet  Commonly known as:  ROBAXIN  Take 1 tablet (500 mg total) by mouth every 6 (six) hours as needed for muscle spasms.     multivitamin with minerals Tabs tablet  Take 1 tablet by mouth every morning. Centrum     ondansetron 4 MG tablet  Commonly known as:  ZOFRAN  Take 1 tablet (4 mg total) by mouth every 6 (six) hours as needed for nausea.     oxyCODONE-acetaminophen 5-325 MG per tablet  Commonly known as:  PERCOCET/ROXICET  Take 1 tablet by mouth every 4 (four) hours as needed for moderate pain.     oxymetazoline 0.05 % nasal spray  Commonly known as:  AFRIN  Place 1-2 sprays into both nostrils daily as needed for congestion.     pravastatin 20 MG tablet  Commonly known as:  PRAVACHOL  Take 20 mg by mouth every morning.     PRENATAL VITAMINS PO  Take 1 capsule by mouth every morning.     Vitamin D (Ergocalciferol) 50000 UNITS Caps capsule  Commonly known as:  DRISDOL  Take 50,000 Units by mouth every 7 (seven) days. On Tuesdays           Follow-up Information   Follow up with GIOFFRE,RONALD A, MD In 2 weeks.   Specialty:  Orthopedic Surgery   Contact information:   [address] [place] [address] [phone]       Signed: Ardeen Jourdain, PA-C Orthopaedic  Surgery 06/20/2014, 9:00 PM

## 2014-06-21 DIAGNOSIS — I739 Peripheral vascular disease, unspecified: Secondary | ICD-10-CM | POA: Diagnosis not present

## 2014-06-21 DIAGNOSIS — M6281 Muscle weakness (generalized): Secondary | ICD-10-CM | POA: Diagnosis not present

## 2014-06-21 DIAGNOSIS — I2510 Atherosclerotic heart disease of native coronary artery without angina pectoris: Secondary | ICD-10-CM | POA: Diagnosis not present

## 2014-06-21 DIAGNOSIS — E038 Other specified hypothyroidism: Secondary | ICD-10-CM | POA: Diagnosis not present

## 2014-06-21 DIAGNOSIS — D649 Anemia, unspecified: Secondary | ICD-10-CM | POA: Diagnosis not present

## 2014-06-21 DIAGNOSIS — Z7901 Long term (current) use of anticoagulants: Secondary | ICD-10-CM | POA: Diagnosis not present

## 2014-06-21 DIAGNOSIS — I10 Essential (primary) hypertension: Secondary | ICD-10-CM | POA: Diagnosis not present

## 2014-06-21 DIAGNOSIS — J439 Emphysema, unspecified: Secondary | ICD-10-CM | POA: Diagnosis not present

## 2014-06-21 DIAGNOSIS — E559 Vitamin D deficiency, unspecified: Secondary | ICD-10-CM | POA: Diagnosis not present

## 2014-06-21 DIAGNOSIS — E785 Hyperlipidemia, unspecified: Secondary | ICD-10-CM | POA: Diagnosis not present

## 2014-06-21 DIAGNOSIS — M8580 Other specified disorders of bone density and structure, unspecified site: Secondary | ICD-10-CM | POA: Diagnosis not present

## 2014-06-21 DIAGNOSIS — R2681 Unsteadiness on feet: Secondary | ICD-10-CM | POA: Diagnosis not present

## 2014-06-21 DIAGNOSIS — Z471 Aftercare following joint replacement surgery: Secondary | ICD-10-CM | POA: Diagnosis not present

## 2014-06-21 DIAGNOSIS — M25561 Pain in right knee: Secondary | ICD-10-CM | POA: Diagnosis not present

## 2014-06-21 DIAGNOSIS — E039 Hypothyroidism, unspecified: Secondary | ICD-10-CM | POA: Diagnosis not present

## 2014-06-21 DIAGNOSIS — S83104A Unspecified dislocation of right knee, initial encounter: Secondary | ICD-10-CM | POA: Diagnosis not present

## 2014-06-21 DIAGNOSIS — F5101 Primary insomnia: Secondary | ICD-10-CM | POA: Diagnosis not present

## 2014-06-21 DIAGNOSIS — Z96651 Presence of right artificial knee joint: Secondary | ICD-10-CM | POA: Diagnosis not present

## 2014-06-21 DIAGNOSIS — R278 Other lack of coordination: Secondary | ICD-10-CM | POA: Diagnosis not present

## 2014-06-21 LAB — CBC
HCT: 27.9 % — ABNORMAL LOW (ref 36.0–46.0)
Hemoglobin: 9.2 g/dL — ABNORMAL LOW (ref 12.0–15.0)
MCH: 27.8 pg (ref 26.0–34.0)
MCHC: 33 g/dL (ref 30.0–36.0)
MCV: 84.3 fL (ref 78.0–100.0)
Platelets: 201 10*3/uL (ref 150–400)
RBC: 3.31 MIL/uL — ABNORMAL LOW (ref 3.87–5.11)
RDW: 14.8 % (ref 11.5–15.5)
WBC: 6.9 10*3/uL (ref 4.0–10.5)

## 2014-06-21 NOTE — Progress Notes (Signed)
Occupational Therapy Treatment Patient Details Name: [name] MRN: [id number] DOB: [birth year] Today's Date: 06/21/2014    History of present illness R TKR   OT comments  Pt limited by pain and fatique.  She was on commode, getting bathed and did not want pain meds until after she had breakfast.  Continues to need cues for safety.  Follow Up Recommendations  SNF    Equipment Recommendations  3 in 1 bedside comode    Recommendations for Other Services      Precautions / Restrictions Precautions Precautions: Knee;Fall Required Braces or Orthoses: Knee Immobilizer - Right Knee Immobilizer - Right: Discontinue once straight leg raise with < 10 degree lag Restrictions Weight Bearing Restrictions: No Other Position/Activity Restrictions: WBAT       Mobility Bed Mobility           Sit to supine: Min assist   General bed mobility comments: assist for LLE  Transfers   Equipment used: Rolling walker (2 wheeled) Transfers: Sit to/from Stand Sit to Stand: Min assist         General transfer comment: cues for LE management and use of UEs to self assist    Balance                                   ADL Overall ADL's : Needs assistance/impaired                     Lower Body Dressing: Minimal assistance;Sit to/from stand   Toilet Transfer: Stand-pivot;Minimal assistance;RW;BSC             General ADL Comments: pt was on commode and had finished washing.   Assisted with LB ADLs and back to bed.  Pt wore hospital mesh underwear and couldn't usse reacher for these.  Demonstrated use of reacher for her pants.  pt donned top and dress today.  Pt did need safety cues:  tends to pull up on walker and when sidestepping, she let go of walker and placed one hand on bed rail.  Cued to place both hands on walker  Pt felt hot during session and rested      Vision                     Perception     Praxis      Cognition    Behavior During Therapy: [facility] for tasks assessed/performed Overall Cognitive Status: Within Functional Limits for tasks assessed                       Extremity/Trunk Assessment               Exercises     Shoulder Instructions       General Comments      Pertinent Vitals/ Pain       Pain Score: 8  Pain Location: R knee Pain Descriptors / Indicators: Aching Pain Intervention(s): Limited activity within patient's tolerance;Monitored during session;Repositioned;Ice applied (pt wants pain meds after breakfast)  Home Living                                          Prior Functioning/Environment              Frequency Min 2X/week  Progress Toward Goals  OT Goals(current goals can now be found in the care plan section)  Progress towards OT goals: Progressing toward goals     Plan      Co-evaluation                 End of Session     Activity Tolerance Patient limited by pain;Patient limited by fatigue   Patient Left in bed;with call bell/phone within reach;with bed alarm set   Nurse Communication          Time: [phone] OT Time Calculation (min): 23 min  Charges: OT General Charges $OT Visit: 1 Procedure OT Treatments $Self Care/Home Management : 23-37 mins  Gaynel Schaafsma 06/21/2014, 8:30 AM   Lesle Chris, OTR/L [phone] 06/21/2014

## 2014-06-21 NOTE — Progress Notes (Signed)
   Subjective: 3 Days Post-Op Procedure(s) (LRB): RIGHT TOTAL KNEE ARTHROPLASTY (Right) Patient reports pain as mild.   Patient seen in rounds without Dr. Gladstone Lighter Patient is well, and has had no acute complaints or problems. She has felt fatigued from therapy. Ready to go to SNF today. No issues overnight. No SOB or chest pain. Voiding well. Positive flatus.  .  Objective: Vital signs in last 24 hours: Temp:  [97.5 F (36.4 C)-98.2 F (36.8 C)] 98.2 F (36.8 C) (10/30 0600) Pulse Rate:  [96-101] 96 (10/30 0600) Resp:  [16-20] 16 (10/30 0600) BP: (107-141)/(48-70) 118/62 mmHg (10/30 0600) SpO2:  [94 %-98 %] 97 % (10/30 0600)  Intake/Output from previous day:  Intake/Output Summary (Last 24 hours) at 06/21/14 0702 Last data filed at 06/21/14 0601  Gross per 24 hour  Intake    480 ml  Output    200 ml  Net    280 ml     Labs:  Recent Labs  06/19/14 0432 06/20/14 0418 06/21/14 0530  HGB 11.6* 9.6* 9.2*    Recent Labs  06/20/14 0418 06/21/14 0530  WBC 9.5 6.9  RBC 3.46* 3.31*  HCT 29.7* 27.9*  PLT 216 201    Recent Labs  06/19/14 0432 06/20/14 0418  NA 140 141  K 4.3 3.4*  CL 101 101  CO2 24 29  BUN 9 17  CREATININE 0.46* 0.62  GLUCOSE 198* 152*  CALCIUM 9.0 9.0    EXAM General - Patient is Alert and Oriented Extremity - Neurologically intact Intact pulses distally Dorsiflexion/Plantar flexion intact Compartment soft Dressing/Incision - clean, dry, no drainage Motor Function - intact, moving foot and toes well on exam.   Past Medical History  Diagnosis Date  . Coronary artery disease   . Hyperlipidemia   . History of rheumatic fever   . Anemia   . OSTEOPENIA   . Insomnia   . COLONIC POLYPS, HX OF   . Hypothyroidism   . Hypertension   . Gastroesophageal reflux disease   . PVD (peripheral vascular disease) 10/31/2011  . Vitamin D deficiency   . RSD (reflex sympathetic dystrophy) 10/31/2011  . PAD (peripheral artery disease) 12/03/2011   status post right SFA [place] atherectomy  . COPD (chronic obstructive pulmonary disease)     no per pt on 03/21/2014  . Osteoarthritis   . DISC DISEASE, CERVICAL   . Arthritis     "my whole body"  . Heart murmur     as a child  . Anxiety     hx    Assessment/Plan: 3 Days Post-Op Procedure(s) (LRB): RIGHT TOTAL KNEE ARTHROPLASTY (Right) Active Problems:   Total knee replacement status  Estimated body mass index is 23.74 kg/(m^2) as calculated from the following:   Height as of this encounter: 5\' 3"  (1.6 m).   Weight as of this encounter: 60.782 kg (134 lb). Advance diet Up with therapy Discharge to SNF  DVT Prophylaxis - Xarelto; will transition back to Plavix tomorrow Weight-Bearing as tolerated   Doing some better with therapy. Somewhat limited by fatigue. Will continue PT today and plan for DC to SNF this afternoon. Follow up in office in 2 weeks.  Ardeen Jourdain, PA-C Orthopaedic Surgery 06/21/2014, 7:02 AM

## 2014-06-21 NOTE — Progress Notes (Signed)
Clinical Social Work Department CLINICAL SOCIAL WORK PLACEMENT NOTE 06/21/2014  Patient:  [name], [name]  Account Number:  [id number] Admit date:  06/18/2014  Clinical Social Worker:  Werner Lean, LCSW  Date/time:  06/19/2014 09:08 AM  Clinical Social Work is seeking post-discharge placement for this patient at the following level of care:   SKILLED NURSING   (*CSW will update this form in Epic as items are completed)   06/19/2014  Patient/family provided with [place] Department of Clinical Social Work's list of facilities offering this level of care within the geographic area requested by the patient (or if unable, by the patient's family).    Patient/family informed of their freedom to choose among providers that offer the needed level of care, that participate in Medicare, Medicaid or managed care program needed by the patient, have an available bed and are willing to accept the patient.    Patient/family informed of [facility]' ownership interest in [facility], as well as of the fact that they are under no obligation to receive care at this facility.  PASARR submitted to EDS on 06/19/2014 PASARR number received on 06/19/2014  FL2 transmitted to all facilities in geographic area requested by pt/family on  06/19/2014 FL2 transmitted to all facilities within larger geographic area on   Patient informed that his/her managed care company has contracts with or will negotiate with  certain facilities, including the following:     Patient/family informed of bed offers received:  06/19/2014 Patient chooses bed at [facility] Physician recommends and patient chooses bed at    Patient to be transferred to [facility] on  06/21/2014 Patient to be transferred to facility by [facility] Patient and family notified of transfer on 06/21/2014 Name of family member notified:  SON  The following physician request were entered in  Epic:   Additional Comments: Pt/son are in agreement with d/c to SNF today. [facility] transport is required. NSG reviewed d/c summary, scripts, avs. Scripts included in d/c packet.  Werner Lean LCSW [phone]

## 2014-06-24 ENCOUNTER — Non-Acute Institutional Stay (SKILLED_NURSING_FACILITY): Payer: Medicare Other | Admitting: Internal Medicine

## 2014-06-24 ENCOUNTER — Encounter: Payer: Self-pay | Admitting: Internal Medicine

## 2014-06-24 DIAGNOSIS — E785 Hyperlipidemia, unspecified: Secondary | ICD-10-CM

## 2014-06-24 DIAGNOSIS — E038 Other specified hypothyroidism: Secondary | ICD-10-CM | POA: Diagnosis not present

## 2014-06-24 DIAGNOSIS — J439 Emphysema, unspecified: Secondary | ICD-10-CM | POA: Diagnosis not present

## 2014-06-24 DIAGNOSIS — E559 Vitamin D deficiency, unspecified: Secondary | ICD-10-CM

## 2014-06-24 DIAGNOSIS — E034 Atrophy of thyroid (acquired): Secondary | ICD-10-CM

## 2014-06-24 DIAGNOSIS — Z96651 Presence of right artificial knee joint: Secondary | ICD-10-CM | POA: Diagnosis not present

## 2014-06-24 DIAGNOSIS — I2510 Atherosclerotic heart disease of native coronary artery without angina pectoris: Secondary | ICD-10-CM

## 2014-06-24 NOTE — Assessment & Plan Note (Signed)
Replaced. °

## 2014-06-24 NOTE — Assessment & Plan Note (Signed)
On ASA and statin + BP meds

## 2014-06-24 NOTE — Assessment & Plan Note (Signed)
2/2 endstage OA; pain meds, robaxin and ASA and plavix as prophylaxis and OT/PT

## 2014-06-24 NOTE — Assessment & Plan Note (Signed)
Continue levothyroxine; TSH 0.015 in 02/2014

## 2014-06-24 NOTE — Assessment & Plan Note (Signed)
LDL 70,HDL 48 on pravachol 20 mg

## 2014-06-24 NOTE — Progress Notes (Signed)
MRN: [id number] Name: [name]  Sex: female Age: 68 y.o. DOB: October 09, 1945  [id number] Facility/Room:311 Level Of Care: SNF Provider: Inocencio Homes D Emergency Contacts: Extended Emergency Contact Information Primary Emergency Contact: Deloris Ping Address: [place], [place] Phone: [phone] Mobile Phone: [phone] Relation: Son Secondary Emergency Contact: Sherrine Maples [place] Phone: [phone] Mobile Phone: [phone] Relation: Relative    Allergies: Lipitor  Chief Complaint  Patient presents with  . New Admit To SNF    HPI: Patient is 68 y.o. female who is s/p R knee replacement and admitted to SNF for OT/PT.  Past Medical History  Diagnosis Date  . Coronary artery disease   . Hyperlipidemia   . History of rheumatic fever   . Anemia   . OSTEOPENIA   . Insomnia   . COLONIC POLYPS, HX OF   . Hypothyroidism   . Hypertension   . Gastroesophageal reflux disease   . PVD (peripheral vascular disease) 10/31/2011  . Vitamin D deficiency   . RSD (reflex sympathetic dystrophy) 10/31/2011  . PAD (peripheral artery disease) 12/03/2011    status post right SFA [place] atherectomy  . COPD (chronic obstructive pulmonary disease)     no per pt on 03/21/2014  . Osteoarthritis   . DISC DISEASE, CERVICAL   . Arthritis     "my whole body"  . Heart murmur     as a child  . Anxiety     hx    Past Surgical History  Procedure Laterality Date  . Abdominal hysterectomy  1977    "partial"  . Thyroidectomy  1995  . Tonsillectomy    . Carpal tunnel release Right 2005  . Cataract extraction Left 2013  . Balloon angioplasty, artery Right 03/21/2014    SFA  . Dilation and curettage of uterus  1978  . Coronary artery bypass graft  10/2009    LIMA to the LAD, saphenous vein graft to the acute marginal, saphenous vein graft to the diagonal and obtuse marginal.  .  Cardiac catheterization  2011   . Total knee arthroplasty Right 06/18/2014    Procedure: RIGHT TOTAL KNEE ARTHROPLASTY;  Surgeon: Tobi Bastos, MD;  Location: [facility];  Service: Orthopedics;  Laterality: Right;      Medication List       This list is accurate as of: 06/24/14  7:11 PM.  Always use your most recent med list.               amLODipine 10 MG tablet  Commonly known as:  NORVASC  Take 10 mg by mouth at bedtime.     aspirin EC 81 MG tablet  Take 81 mg by mouth every morning.     BC HEADACHE POWDER PO  Take 1 packet by mouth 3 (three) times daily as needed (headache).     CALCIUM 1000 + D PO  Take 1 tablet by mouth daily at 2 PM daily at 2 PM.     clopidogrel 75 MG tablet  Commonly known as:  PLAVIX  Take 1 tablet (75 mg total) by mouth daily with breakfast.     COD LIVER OIL PO  Take 1 tablet by mouth every morning.     DSS 100 MG Caps  Take 100 mg by mouth 2 (two) times daily.     ferrous sulfate 325 (65 FE) MG tablet  Take 1 tablet (325 mg total) by mouth 3 (three) times daily after meals.     FISH OIL PO  Take 1 tablet by mouth every morning.     hydrochlorothiazide 25 MG tablet  Commonly known as:  HYDRODIURIL  Take 25 mg by mouth daily after lunch.     levothyroxine 112 MCG tablet  Commonly known as:  SYNTHROID, LEVOTHROID  Take 112 mcg by mouth daily before breakfast.     methocarbamol 500 MG tablet  Commonly known as:  ROBAXIN  Take 1 tablet (500 mg total) by mouth every 6 (six) hours as needed for muscle spasms.     multivitamin with minerals Tabs tablet  Take 1 tablet by mouth every morning. Centrum     ondansetron 4 MG tablet  Commonly known as:  ZOFRAN  Take 1 tablet (4 mg total) by mouth every 6 (six) hours as needed for nausea.     oxyCODONE-acetaminophen 5-325 MG per tablet  Commonly known as:  PERCOCET/ROXICET  Take 1 tablet by mouth every 4 (four) hours as needed for moderate pain.     oxymetazoline 0.05 % nasal spray   Commonly known as:  AFRIN  Place 1-2 sprays into both nostrils daily as needed for congestion.     pravastatin 20 MG tablet  Commonly known as:  PRAVACHOL  Take 20 mg by mouth every morning.     PRENATAL VITAMINS PO  Take 1 capsule by mouth every morning.     Vitamin D (Ergocalciferol) 50000 UNITS Caps capsule  Commonly known as:  DRISDOL  Take 50,000 Units by mouth every 7 (seven) days. On Tuesdays        No orders of the defined types were placed in this encounter.    Immunization History  Administered Date(s) Administered  . H1N1 09/12/2008  . Influenza Whole 07/24/2007, 05/23/2008, 06/17/2009, 06/16/2010  . Influenza, High Dose Seasonal PF 05/15/2014  . Influenza,inj,Quad PF,36+ Mos 05/11/2013  . Pneumococcal Conjugate-13 08/02/2013  . Pneumococcal Polysaccharide-23 09/07/2007, 11/16/2012  . Td 09/12/2008  . Zoster 12/12/2007    History  Substance Use Topics  . Smoking status: Former Smoker -- 0.80 packs/day for 45 years    Types: Cigarettes    Quit date: 08/23/2009  . Smokeless tobacco: Never Used  . Alcohol Use: No    Family history is noncontributory    Review of Systems  DATA OBTAINED: from patient GENERAL:  no fevers, fatigue, appetite changes SKIN: No itching, rash or wounds EYES: No eye pain, redness, discharge EARS: No earache, tinnitus, change in hearing NOSE: No congestion, drainage or bleeding  MOUTH/THROAT: No mouth or tooth pain, No sore throat RESPIRATORY: No cough, wheezing, SOB CARDIAC: No chest pain, palpitations, lower extremity edema  GI: No abdominal pain, No N/V/D or constipation, No heartburn or reflux  GU: No dysuria, frequency or urgency, or incontinence  MUSCULOSKELETAL: No unrelieved bone/joint pain NEUROLOGIC: No headache, dizziness PSYCHIATRIC: No overt anxiety or sadness, No behavior issue.   Filed Vitals:   06/24/14 1845  BP: 114/80  Pulse: 90  Temp: 97.9 F (36.6 C)  Resp: 20    Physical Exam  GENERAL  APPEARANCE: Alert, conversant,  No acute distress, BF  SKIN: No diaphoresis rash HEAD: Normocephalic, atraumatic  EYES: Conjunctiva/lids clear. Pupils round, reactive. EOMs intact.  EARS: External exam WNL, canals clear. Hearing grossly normal.  NOSE: No deformity or discharge.  MOUTH/THROAT: Lips w/o lesions  RESPIRATORY: Breathing is even, unlabored. Lung sounds are clear   CARDIOVASCULAR: Heart  RRR no murmurs, rubs or gallops. No peripheral edema.   GASTROINTESTINAL: Abdomen is soft, non-tender, not distended w/ normal bowel sounds. GENITOURINARY: Bladder non tender, not distended  MUSCULOSKELETAL: R leg in knee immobilizer NEUROLOGIC:  Cranial nerves 2-12 grossly intact. Moves all extremities  PSYCHIATRIC: Mood and affect appropriate to situation, no behavioral issues  Patient Active Problem List   Diagnosis Date Noted  . Total knee replacement status 06/18/2014  . Tooth pain 04/18/2014  . Claudication 03/21/2014  . Primary localized osteoarthrosis, lower leg 08/10/2013  . Right knee pain 08/02/2013  . Acute upper respiratory infections of unspecified site 08/02/2013  . Dyspepsia 11/16/2012  . Knee pain, right 02/18/2012  . PAD (peripheral artery disease) 12/03/2011  . PVD (peripheral vascular disease) 10/31/2011  . RSD (reflex sympathetic dystrophy) 10/31/2011  . Vitamin D deficiency   . Preop exam for internal medicine 10/28/2011  . Anxiety 03/11/2011  . COPD (chronic obstructive pulmonary disease) 02/02/2011  . Anemia 02/02/2011  . History of rheumatic fever 02/02/2011  . Insomnia 02/02/2011  . Coronary atherosclerosis 09/08/2010  . CONSTIPATION 02/19/2010  . TOBACCO ABUSE 07/21/2009  . MURMUR 06/17/2009  . [place] DISEASE, CERVICAL 03/20/2009  . Pain in Soft Tissues of Limb 05/23/2008  . Hypothyroidism 09/07/2007  . Hyperlipidemia LDL goal <70 09/07/2007  . HYPERTENSION 09/07/2007  . ALLERGIC RHINITIS 09/07/2007  . GERD 09/07/2007  . Primary osteoarthritis of right  knee 09/07/2007  . OSTEOPENIA 09/07/2007  . COLONIC POLYPS, HX OF 09/07/2007    CBC    Component Value Date/Time   WBC 6.9 06/21/2014 0530   WBC 7.3 07/21/2012 1325   RBC 3.31* 06/21/2014 0530   RBC 4.63 07/21/2012 1325   HGB 9.2* 06/21/2014 0530   HGB 13.5 07/21/2012 1325   HCT 27.9* 06/21/2014 0530   HCT 43.2 07/21/2012 1325   PLT 201 06/21/2014 0530   MCV 84.3 06/21/2014 0530   MCV 93.3 07/21/2012 1325   LYMPHSABS 1.9 06/10/2014 1504   MONOABS 0.5 06/10/2014 1504   EOSABS 0.2 06/10/2014 1504   BASOSABS 0.0 06/10/2014 1504    CMP     Component Value Date/Time   NA 141 06/20/2014 0418   K 3.4* 06/20/2014 0418   CL 101 06/20/2014 0418   CO2 29 06/20/2014 0418   GLUCOSE 152* 06/20/2014 0418   BUN 17 06/20/2014 0418   CREATININE 0.62 06/20/2014 0418   CREATININE 0.58 05/21/2014 0909   CALCIUM 9.0 06/20/2014 0418   PROT 7.4 06/11/2014 0920   ALBUMIN 3.8 06/11/2014 0920   AST 16 06/11/2014 0920   ALT 17 06/11/2014 0920   ALKPHOS 91 06/11/2014 0920   BILITOT 0.4 06/11/2014 0920   GFRNONAA 89* 06/20/2014 0418   GFRNONAA >89 05/21/2014 0909   GFRAA >90 06/20/2014 0418   GFRAA >89 05/21/2014 0909    Assessment and Plan  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

## 2014-06-24 NOTE — Assessment & Plan Note (Signed)
Chronic and stable and on no meds

## 2014-06-25 ENCOUNTER — Other Ambulatory Visit: Payer: Self-pay

## 2014-06-25 DIAGNOSIS — R911 Solitary pulmonary nodule: Secondary | ICD-10-CM

## 2014-06-29 DIAGNOSIS — I739 Peripheral vascular disease, unspecified: Secondary | ICD-10-CM | POA: Diagnosis not present

## 2014-06-29 DIAGNOSIS — J449 Chronic obstructive pulmonary disease, unspecified: Secondary | ICD-10-CM | POA: Diagnosis not present

## 2014-06-29 DIAGNOSIS — I10 Essential (primary) hypertension: Secondary | ICD-10-CM | POA: Diagnosis not present

## 2014-06-29 DIAGNOSIS — M1990 Unspecified osteoarthritis, unspecified site: Secondary | ICD-10-CM | POA: Diagnosis not present

## 2014-06-29 DIAGNOSIS — Z471 Aftercare following joint replacement surgery: Secondary | ICD-10-CM | POA: Diagnosis not present

## 2014-06-29 DIAGNOSIS — I2510 Atherosclerotic heart disease of native coronary artery without angina pectoris: Secondary | ICD-10-CM | POA: Diagnosis not present

## 2014-07-01 DIAGNOSIS — M1990 Unspecified osteoarthritis, unspecified site: Secondary | ICD-10-CM | POA: Diagnosis not present

## 2014-07-01 DIAGNOSIS — I2510 Atherosclerotic heart disease of native coronary artery without angina pectoris: Secondary | ICD-10-CM | POA: Diagnosis not present

## 2014-07-01 DIAGNOSIS — J449 Chronic obstructive pulmonary disease, unspecified: Secondary | ICD-10-CM | POA: Diagnosis not present

## 2014-07-01 DIAGNOSIS — I10 Essential (primary) hypertension: Secondary | ICD-10-CM | POA: Diagnosis not present

## 2014-07-01 DIAGNOSIS — Z471 Aftercare following joint replacement surgery: Secondary | ICD-10-CM | POA: Diagnosis not present

## 2014-07-01 DIAGNOSIS — I739 Peripheral vascular disease, unspecified: Secondary | ICD-10-CM | POA: Diagnosis not present

## 2014-07-03 DIAGNOSIS — M1990 Unspecified osteoarthritis, unspecified site: Secondary | ICD-10-CM | POA: Diagnosis not present

## 2014-07-03 DIAGNOSIS — I739 Peripheral vascular disease, unspecified: Secondary | ICD-10-CM | POA: Diagnosis not present

## 2014-07-03 DIAGNOSIS — I10 Essential (primary) hypertension: Secondary | ICD-10-CM | POA: Diagnosis not present

## 2014-07-03 DIAGNOSIS — J449 Chronic obstructive pulmonary disease, unspecified: Secondary | ICD-10-CM | POA: Diagnosis not present

## 2014-07-03 DIAGNOSIS — I2510 Atherosclerotic heart disease of native coronary artery without angina pectoris: Secondary | ICD-10-CM | POA: Diagnosis not present

## 2014-07-03 DIAGNOSIS — Z471 Aftercare following joint replacement surgery: Secondary | ICD-10-CM | POA: Diagnosis not present

## 2014-07-04 DIAGNOSIS — Z96651 Presence of right artificial knee joint: Secondary | ICD-10-CM | POA: Diagnosis not present

## 2014-07-04 DIAGNOSIS — Z471 Aftercare following joint replacement surgery: Secondary | ICD-10-CM | POA: Diagnosis not present

## 2014-07-05 DIAGNOSIS — I2510 Atherosclerotic heart disease of native coronary artery without angina pectoris: Secondary | ICD-10-CM | POA: Diagnosis not present

## 2014-07-05 DIAGNOSIS — I739 Peripheral vascular disease, unspecified: Secondary | ICD-10-CM | POA: Diagnosis not present

## 2014-07-05 DIAGNOSIS — J449 Chronic obstructive pulmonary disease, unspecified: Secondary | ICD-10-CM | POA: Diagnosis not present

## 2014-07-05 DIAGNOSIS — Z471 Aftercare following joint replacement surgery: Secondary | ICD-10-CM | POA: Diagnosis not present

## 2014-07-05 DIAGNOSIS — M1990 Unspecified osteoarthritis, unspecified site: Secondary | ICD-10-CM | POA: Diagnosis not present

## 2014-07-05 DIAGNOSIS — I10 Essential (primary) hypertension: Secondary | ICD-10-CM | POA: Diagnosis not present

## 2014-07-08 DIAGNOSIS — I10 Essential (primary) hypertension: Secondary | ICD-10-CM | POA: Diagnosis not present

## 2014-07-08 DIAGNOSIS — J449 Chronic obstructive pulmonary disease, unspecified: Secondary | ICD-10-CM | POA: Diagnosis not present

## 2014-07-08 DIAGNOSIS — I2510 Atherosclerotic heart disease of native coronary artery without angina pectoris: Secondary | ICD-10-CM | POA: Diagnosis not present

## 2014-07-08 DIAGNOSIS — M1990 Unspecified osteoarthritis, unspecified site: Secondary | ICD-10-CM | POA: Diagnosis not present

## 2014-07-08 DIAGNOSIS — I739 Peripheral vascular disease, unspecified: Secondary | ICD-10-CM | POA: Diagnosis not present

## 2014-07-08 DIAGNOSIS — Z471 Aftercare following joint replacement surgery: Secondary | ICD-10-CM | POA: Diagnosis not present

## 2014-07-10 DIAGNOSIS — I10 Essential (primary) hypertension: Secondary | ICD-10-CM | POA: Diagnosis not present

## 2014-07-10 DIAGNOSIS — Z471 Aftercare following joint replacement surgery: Secondary | ICD-10-CM | POA: Diagnosis not present

## 2014-07-10 DIAGNOSIS — I739 Peripheral vascular disease, unspecified: Secondary | ICD-10-CM | POA: Diagnosis not present

## 2014-07-10 DIAGNOSIS — J449 Chronic obstructive pulmonary disease, unspecified: Secondary | ICD-10-CM | POA: Diagnosis not present

## 2014-07-10 DIAGNOSIS — I2510 Atherosclerotic heart disease of native coronary artery without angina pectoris: Secondary | ICD-10-CM | POA: Diagnosis not present

## 2014-07-10 DIAGNOSIS — M1990 Unspecified osteoarthritis, unspecified site: Secondary | ICD-10-CM | POA: Diagnosis not present

## 2014-07-12 DIAGNOSIS — I2510 Atherosclerotic heart disease of native coronary artery without angina pectoris: Secondary | ICD-10-CM | POA: Diagnosis not present

## 2014-07-12 DIAGNOSIS — J449 Chronic obstructive pulmonary disease, unspecified: Secondary | ICD-10-CM | POA: Diagnosis not present

## 2014-07-12 DIAGNOSIS — I739 Peripheral vascular disease, unspecified: Secondary | ICD-10-CM | POA: Diagnosis not present

## 2014-07-12 DIAGNOSIS — Z471 Aftercare following joint replacement surgery: Secondary | ICD-10-CM | POA: Diagnosis not present

## 2014-07-12 DIAGNOSIS — M1990 Unspecified osteoarthritis, unspecified site: Secondary | ICD-10-CM | POA: Diagnosis not present

## 2014-07-12 DIAGNOSIS — I10 Essential (primary) hypertension: Secondary | ICD-10-CM | POA: Diagnosis not present

## 2014-07-15 DIAGNOSIS — M179 Osteoarthritis of knee, unspecified: Secondary | ICD-10-CM | POA: Diagnosis not present

## 2014-07-16 DIAGNOSIS — E890 Postprocedural hypothyroidism: Secondary | ICD-10-CM | POA: Diagnosis not present

## 2014-07-16 DIAGNOSIS — E559 Vitamin D deficiency, unspecified: Secondary | ICD-10-CM | POA: Diagnosis not present

## 2014-07-16 DIAGNOSIS — R918 Other nonspecific abnormal finding of lung field: Secondary | ICD-10-CM | POA: Diagnosis not present

## 2014-07-16 DIAGNOSIS — I739 Peripheral vascular disease, unspecified: Secondary | ICD-10-CM | POA: Diagnosis not present

## 2014-07-16 DIAGNOSIS — E785 Hyperlipidemia, unspecified: Secondary | ICD-10-CM | POA: Diagnosis not present

## 2014-07-16 DIAGNOSIS — D500 Iron deficiency anemia secondary to blood loss (chronic): Secondary | ICD-10-CM | POA: Diagnosis not present

## 2014-07-16 DIAGNOSIS — J449 Chronic obstructive pulmonary disease, unspecified: Secondary | ICD-10-CM | POA: Diagnosis not present

## 2014-07-16 DIAGNOSIS — I2510 Atherosclerotic heart disease of native coronary artery without angina pectoris: Secondary | ICD-10-CM | POA: Diagnosis not present

## 2014-07-23 DIAGNOSIS — M179 Osteoarthritis of knee, unspecified: Secondary | ICD-10-CM | POA: Diagnosis not present

## 2014-07-25 DIAGNOSIS — M179 Osteoarthritis of knee, unspecified: Secondary | ICD-10-CM | POA: Diagnosis not present

## 2014-08-01 ENCOUNTER — Encounter (HOSPITAL_COMMUNITY): Payer: Self-pay | Admitting: Cardiovascular Disease

## 2014-08-02 DIAGNOSIS — S92255A Nondisplaced fracture of navicular [scaphoid] of left foot, initial encounter for closed fracture: Secondary | ICD-10-CM | POA: Diagnosis not present

## 2014-08-06 ENCOUNTER — Ambulatory Visit: Payer: Medicare Other | Admitting: Thoracic Surgery (Cardiothoracic Vascular Surgery)

## 2014-08-06 ENCOUNTER — Other Ambulatory Visit: Payer: Medicare Other

## 2014-08-09 DIAGNOSIS — M179 Osteoarthritis of knee, unspecified: Secondary | ICD-10-CM | POA: Diagnosis not present

## 2014-08-12 ENCOUNTER — Encounter: Payer: Self-pay | Admitting: Cardiology

## 2014-08-13 DIAGNOSIS — M179 Osteoarthritis of knee, unspecified: Secondary | ICD-10-CM | POA: Diagnosis not present

## 2014-08-13 DIAGNOSIS — Z471 Aftercare following joint replacement surgery: Secondary | ICD-10-CM | POA: Diagnosis not present

## 2014-08-13 DIAGNOSIS — S92252D Displaced fracture of navicular [scaphoid] of left foot, subsequent encounter for fracture with routine healing: Secondary | ICD-10-CM | POA: Diagnosis not present

## 2014-08-13 DIAGNOSIS — Z96651 Presence of right artificial knee joint: Secondary | ICD-10-CM | POA: Diagnosis not present

## 2014-08-21 DIAGNOSIS — Z96651 Presence of right artificial knee joint: Secondary | ICD-10-CM | POA: Diagnosis not present

## 2014-08-21 DIAGNOSIS — Z471 Aftercare following joint replacement surgery: Secondary | ICD-10-CM | POA: Diagnosis not present

## 2014-08-27 DIAGNOSIS — M1712 Unilateral primary osteoarthritis, left knee: Secondary | ICD-10-CM | POA: Diagnosis not present

## 2014-08-27 DIAGNOSIS — S92255D Nondisplaced fracture of navicular [scaphoid] of left foot, subsequent encounter for fracture with routine healing: Secondary | ICD-10-CM | POA: Diagnosis not present

## 2014-08-27 DIAGNOSIS — Z96651 Presence of right artificial knee joint: Secondary | ICD-10-CM | POA: Diagnosis not present

## 2014-08-27 DIAGNOSIS — Z471 Aftercare following joint replacement surgery: Secondary | ICD-10-CM | POA: Diagnosis not present

## 2014-08-30 DIAGNOSIS — Z96651 Presence of right artificial knee joint: Secondary | ICD-10-CM | POA: Diagnosis not present

## 2014-08-30 DIAGNOSIS — Z471 Aftercare following joint replacement surgery: Secondary | ICD-10-CM | POA: Diagnosis not present

## 2014-09-03 DIAGNOSIS — M1712 Unilateral primary osteoarthritis, left knee: Secondary | ICD-10-CM | POA: Diagnosis not present

## 2014-09-10 ENCOUNTER — Other Ambulatory Visit (INDEPENDENT_AMBULATORY_CARE_PROVIDER_SITE_OTHER): Payer: Medicare Other

## 2014-09-10 ENCOUNTER — Ambulatory Visit (INDEPENDENT_AMBULATORY_CARE_PROVIDER_SITE_OTHER): Payer: 59 | Admitting: Family

## 2014-09-10 ENCOUNTER — Encounter: Payer: Self-pay | Admitting: Family

## 2014-09-10 ENCOUNTER — Other Ambulatory Visit: Payer: Self-pay | Admitting: Family

## 2014-09-10 VITALS — BP 162/102 | HR 92 | Temp 97.6°F | Resp 18 | Ht 63.0 in | Wt 119.8 lb

## 2014-09-10 DIAGNOSIS — M8580 Other specified disorders of bone density and structure, unspecified site: Secondary | ICD-10-CM

## 2014-09-10 DIAGNOSIS — Z0000 Encounter for general adult medical examination without abnormal findings: Secondary | ICD-10-CM

## 2014-09-10 DIAGNOSIS — E785 Hyperlipidemia, unspecified: Secondary | ICD-10-CM | POA: Diagnosis not present

## 2014-09-10 DIAGNOSIS — E039 Hypothyroidism, unspecified: Secondary | ICD-10-CM

## 2014-09-10 DIAGNOSIS — I10 Essential (primary) hypertension: Secondary | ICD-10-CM

## 2014-09-10 LAB — LIPID PANEL
Cholesterol: 161 mg/dL (ref 0–200)
HDL: 47.6 mg/dL (ref >39.00)
LDL Cholesterol: 90 mg/dL (ref 0–99)
NonHDL: 113.4
Total CHOL/HDL Ratio: 3
Triglycerides: 115 mg/dL (ref 0.0–149.0)
VLDL: 23 mg/dL (ref 0.0–40.0)

## 2014-09-10 LAB — CBC
HCT: 40.7 % (ref 36.0–46.0)
Hemoglobin: 13 g/dL (ref 12.0–15.0)
MCHC: 32 g/dL (ref 30.0–36.0)
MCV: 84.9 fl (ref 78.0–100.0)
Platelets: 316 10*3/uL (ref 150.0–400.0)
RBC: 4.79 Mil/uL (ref 3.87–5.11)
RDW: 17 % — ABNORMAL HIGH (ref 11.5–15.5)
WBC: 4.9 10*3/uL (ref 4.0–10.5)

## 2014-09-10 LAB — BASIC METABOLIC PANEL
BUN: 13 mg/dL (ref 6–23)
CO2: 28 mEq/L (ref 19–32)
Calcium: 9.2 mg/dL (ref 8.4–10.5)
Chloride: 106 mEq/L (ref 96–112)
Creatinine, Ser: 0.68 mg/dL (ref 0.40–1.20)
GFR: 109.92 mL/min (ref >60.00)
Glucose, Bld: 114 mg/dL — ABNORMAL HIGH (ref 70–99)
Potassium: 3.7 mEq/L (ref 3.5–5.1)
Sodium: 142 mEq/L (ref 135–145)

## 2014-09-10 LAB — TSH: TSH: 0.06 u[IU]/mL — ABNORMAL LOW (ref 0.35–4.50)

## 2014-09-10 MED ORDER — LEVOTHYROXINE SODIUM 100 MCG PO TABS: 100.0000 ug | ORAL_TABLET | Freq: Every day | ORAL | Status: DC

## 2014-09-10 NOTE — Progress Notes (Signed)
Pre visit review using our clinic review tool, if applicable. No additional management support is needed unless otherwise documented below in the visit note. 

## 2014-09-10 NOTE — Assessment & Plan Note (Signed)
1) Anticipatory Guidance: Discussed importance of wearing a seatbelt while driving and not texting while driving; changing batteries in smoke detector at least once annually; wearing suntan lotion when outside; eating a balanced and moderate diet; getting physical activity at least 30 minutes per day.  2) Immunizations / Screenings / Labs:  All immunizations are up to date per recommendations. Due for dental exam, colonoscopy and DEXA. Patient will schedule colonoscopy and dental exam. Order for DEXA placed. All other screenings are up to date per recommendations. Obtain CBC, BMET, Lipid profile and TSH.   Overall well exam. Discussed risk factors of hypertension which is stable with current medication regimen; hypothyroid which is stable with current medication regimen. Patient encouraged to consume 3 small meals daily and increase physical activity as tolerated by her knee and permitted by orthopedics. Follow up prevention exam in 1 year.

## 2014-09-10 NOTE — Patient Instructions (Addendum)
Thank you for choosing [facility].  Summary/Instructions:  If your symptoms worsen or fail to improve, please contact our office for further instruction, or in case of emergency go directly to the emergency room at the closest medical facility.    Health Maintenance Adopting a healthy lifestyle and getting preventive care can go a long way to promote health and wellness. Talk with your health care provider about what schedule of regular examinations is right for you. This is a good chance for you to check in with your provider about disease prevention and staying healthy. In between checkups, there are plenty of things you can do on your own. Experts have done a lot of research about which lifestyle changes and preventive measures are most likely to keep you healthy. Ask your health care provider for more information. WEIGHT AND DIET  Eat a healthy diet  Be sure to include plenty of vegetables, fruits, low-fat dairy products, and lean protein.  Do not eat a lot of foods high in solid fats, added sugars, or salt.  Get regular exercise. This is one of the most important things you can do for your health.  Most adults should exercise for at least 150 minutes each week. The exercise should increase your heart rate and make you sweat (moderate-intensity exercise).  Most adults should also do strengthening exercises at least twice a week. This is in addition to the moderate-intensity exercise.  Maintain a healthy weight  Body mass index (BMI) is a measurement that can be used to identify possible weight problems. It estimates body fat based on height and weight. Your health care provider can help determine your BMI and help you achieve or maintain a healthy weight.  For females 49 years of age and older:   A BMI below 18.5 is considered underweight.  A BMI of 18.5 to 24.9 is normal.  A BMI of 25 to 29.9 is considered overweight.  A BMI of 30 and above is considered obese.   Watch levels of cholesterol and blood lipids  You should start having your blood tested for lipids and cholesterol at 71 years of age, then have this test every 5 years.  You may need to have your cholesterol levels checked more often if:  Your lipid or cholesterol levels are high.  You are older than 71 years of age.  You are at high risk for heart disease.  CANCER SCREENING   Lung Cancer  Lung cancer screening is recommended for adults 65-40 years old who are at high risk for lung cancer because of a history of smoking.  A yearly low-dose CT scan of the lungs is recommended for people who:  Currently smoke.  Have quit within the past 15 years.  Have at least a 30-pack-year history of smoking. A pack year is smoking an average of one pack of cigarettes a day for 1 year.  Yearly screening should continue until it has been 15 years since you quit.  Yearly screening should stop if you develop a health problem that would prevent you from having lung cancer treatment.  Breast Cancer  Practice breast self-awareness. This means understanding how your breasts normally appear and feel.  It also means doing regular breast self-exams. Let your health care provider know about any changes, no matter how small.  If you are in your 20s or 30s, you should have a clinical breast exam (CBE) by a health care provider every 1-3 years as part of a regular health exam.  If you are 40 or older, have a CBE every year. Also consider having a breast X-ray (mammogram) every year.  If you have a family history of breast cancer, talk to your health care provider about genetic screening.  If you are at high risk for breast cancer, talk to your health care provider about having an MRI and a mammogram every year.  Breast cancer gene (BRCA) assessment is recommended for women who have family members with BRCA-related cancers. BRCA-related cancers  include:  Breast.  Ovarian.  Tubal.  Peritoneal cancers.  Results of the assessment will determine the need for genetic counseling and BRCA1 and BRCA2 testing. Cervical Cancer Routine pelvic examinations to screen for cervical cancer are no longer recommended for nonpregnant women who are considered low risk for cancer of the pelvic organs (ovaries, uterus, and vagina) and who do not have symptoms. A pelvic examination may be necessary if you have symptoms including those associated with pelvic infections. Ask your health care provider if a screening pelvic exam is right for you.   The Pap test is the screening test for cervical cancer for women who are considered at risk.  If you had a hysterectomy for a problem that was not cancer or a condition that could lead to cancer, then you no longer need Pap tests.  If you are older than 65 years, and you have had normal Pap tests for the past 10 years, you no longer need to have Pap tests.  If you have had past treatment for cervical cancer or a condition that could lead to cancer, you need Pap tests and screening for cancer for at least 20 years after your treatment.  If you no longer get a Pap test, assess your risk factors if they change (such as having a new sexual partner). This can affect whether you should start being screened again.  Some women have medical problems that increase their chance of getting cervical cancer. If this is the case for you, your health care provider may recommend more frequent screening and Pap tests.  The human papillomavirus (HPV) test is another test that may be used for cervical cancer screening. The HPV test looks for the virus that can cause cell changes in the cervix. The cells collected during the Pap test can be tested for HPV.  The HPV test can be used to screen women 83 years of age and older. Getting tested for HPV can extend the interval between normal Pap tests from three to five years.  An HPV  test also should be used to screen women of any age who have unclear Pap test results.  After 71 years of age, women should have HPV testing as often as Pap tests.  Colorectal Cancer  This type of cancer can be detected and often prevented.  Routine colorectal cancer screening usually begins at 71 years of age and continues through 72 years of age.  Your health care provider may recommend screening at an earlier age if you have risk factors for colon cancer.  Your health care provider may also recommend using home test kits to check for hidden blood in the stool.  A small camera at the end of a tube can be used to examine your colon directly (sigmoidoscopy or colonoscopy). This is done to check for the earliest forms of colorectal cancer.  Routine screening usually begins at age 23.  Direct examination of the colon should be repeated every 5-10 years through 71 years of age. However,  you may need to be screened more often if early forms of precancerous polyps or small growths are found. Skin Cancer  Check your skin from head to toe regularly.  Tell your health care provider about any new moles or changes in moles, especially if there is a change in a mole's shape or color.  Also tell your health care provider if you have a mole that is larger than the size of a pencil eraser.  Always use sunscreen. Apply sunscreen liberally and repeatedly throughout the day.  Protect yourself by wearing long sleeves, pants, a wide-brimmed hat, and sunglasses whenever you are outside. HEART DISEASE, DIABETES, AND HIGH BLOOD PRESSURE   Have your blood pressure checked at least every 1-2 years. High blood pressure causes heart disease and increases the risk of stroke.  If you are between 84 years and 52 years old, ask your health care provider if you should take aspirin to prevent strokes.  Have regular diabetes screenings. This involves taking a blood sample to check your fasting blood sugar  level.  If you are at a normal weight and have a low risk for diabetes, have this test once every three years after 71 years of age.  If you are overweight and have a high risk for diabetes, consider being tested at a younger age or more often. PREVENTING INFECTION  Hepatitis B  If you have a higher risk for hepatitis B, you should be screened for this virus. You are considered at high risk for hepatitis B if:  You were born in a country where hepatitis B is common. Ask your health care provider which countries are considered high risk.  Your parents were born in a high-risk country, and you have not been immunized against hepatitis B (hepatitis B vaccine).  You have HIV or AIDS.  You use needles to inject street drugs.  You live with someone who has hepatitis B.  You have had sex with someone who has hepatitis B.  You get hemodialysis treatment.  You take certain medicines for conditions, including cancer, organ transplantation, and autoimmune conditions. Hepatitis C  Blood testing is recommended for:  Everyone born from [birth year] through 1967.  Anyone with known risk factors for hepatitis C. Sexually transmitted infections (STIs)  You should be screened for sexually transmitted infections (STIs) including gonorrhea and chlamydia if:  You are sexually active and are younger than 71 years of age.  You are older than 71 years of age and your health care provider tells you that you are at risk for this type of infection.  Your sexual activity has changed since you were last screened and you are at an increased risk for chlamydia or gonorrhea. Ask your health care provider if you are at risk.  If you do not have HIV, but are at risk, it may be recommended that you take a prescription medicine daily to prevent HIV infection. This is called pre-exposure prophylaxis (PrEP). You are considered at risk if:  You are sexually active and do not regularly use condoms or know the HIV status  of your partner(s).  You take drugs by injection.  You are sexually active with a partner who has HIV. Talk with your health care provider about whether you are at high risk of being infected with HIV. If you choose to begin PrEP, you should first be tested for HIV. You should then be tested every 3 months for as long as you are taking PrEP.  PREGNANCY   If  you are premenopausal and you may become pregnant, ask your health care provider about preconception counseling.  If you may become pregnant, take 400 to 800 micrograms (mcg) of folic acid every day.  If you want to prevent pregnancy, talk to your health care provider about birth control (contraception). OSTEOPOROSIS AND MENOPAUSE   Osteoporosis is a disease in which the bones lose minerals and strength with aging. This can result in serious bone fractures. Your risk for osteoporosis can be identified using a bone density scan.  If you are 25 years of age or older, or if you are at risk for osteoporosis and fractures, ask your health care provider if you should be screened.  Ask your health care provider whether you should take a calcium or vitamin D supplement to lower your risk for osteoporosis.  Menopause may have certain physical symptoms and risks.  Hormone replacement therapy may reduce some of these symptoms and risks. Talk to your health care provider about whether hormone replacement therapy is right for you.  HOME CARE INSTRUCTIONS   Schedule regular health, dental, and eye exams.  Stay current with your immunizations.   Do not use any tobacco products including cigarettes, chewing tobacco, or electronic cigarettes.  If you are pregnant, do not drink alcohol.  If you are breastfeeding, limit how much and how often you drink alcohol.  Limit alcohol intake to no more than 1 drink per day for nonpregnant women. One drink equals 12 ounces of beer, 5 ounces of wine, or 1 ounces of hard liquor.  Do not use street  drugs.  Do not share needles.  Ask your health care provider for help if you need support or information about quitting drugs.  Tell your health care provider if you often feel depressed.  Tell your health care provider if you have ever been abused or do not feel safe at home. Document Released: 02/22/2011 Document Revised: 12/24/2013 Document Reviewed: 07/11/2013 [facility] Patient Information 2015 [place], [place]. This information is not intended to replace advice given to you by your health care provider. Make sure you discuss any questions you have with your health care provider.

## 2014-09-10 NOTE — Assessment & Plan Note (Signed)
Order placed for DEXA. And is currently taking calcium and Vitamin D.

## 2014-09-10 NOTE — Progress Notes (Signed)
Subjective:    Patient ID: [name], female    DOB: Jan 09, 1946, 69 y.o.   MRN: [id number]  Chief Complaint  Patient presents with  . CPE    Fasting    HPI:  [name] is a 69 y.o. female who presents today for an annual wellness visit.   1) Health Maintenance - Reports health is good - working on the right knee from a total knee replacement  Diet - Eating about 2 meals a day; more vegetables than anything else; reports changes in test secondary to medications; Does not eat a lot of fried or processed foods  Exercise - Recently completed physical therapy [facility]   2) Preventative Exams / Immunizations:  Dental -- Due for exam Vision -- Up to date   Health Maintenance  Topic Date Due  . DEXA SCAN  05/31/2009  . INFLUENZA VACCINE  03/24/2015  . MAMMOGRAM  01/30/2016  . COLONOSCOPY  10/09/2017  . TETANUS/TDAP  09/12/2018  . PNEUMOCOCCAL POLYSACCHARIDE VACCINE AGE 66 AND OVER  Completed  . ZOSTAVAX  Completed  Patient will establish colonoscopy time.   Immunization History  Administered Date(s) Administered  . H1N1 09/12/2008  . Influenza Whole 07/24/2007, 05/23/2008, 06/17/2009, 06/16/2010  . Influenza, High Dose Seasonal PF 05/15/2014  . Influenza,inj,Quad PF,36+ Mos 05/11/2013  . Pneumococcal Conjugate-13 08/02/2013  . Pneumococcal Polysaccharide-23 09/07/2007, 11/16/2012  . Td 09/12/2008  . Zoster 12/12/2007   Allergies  Allergen Reactions  . Lipitor [Atorvastatin] Other (See Comments)    Muscle aches    Current Outpatient Prescriptions on File Prior to Visit  Medication Sig Dispense Refill  . amLODipine (NORVASC) 10 MG tablet Take 10 mg by mouth at bedtime.     . Aspirin-Salicylamide-Caffeine (BC HEADACHE POWDER PO) Take 1 packet by mouth 3 (three) times daily as needed (headache).    . Calcium Carb-Cholecalciferol (CALCIUM 1000 + D PO) Take 1 tablet by mouth daily at 2 PM daily at 2 PM.     . clopidogrel  (PLAVIX) 75 MG tablet Take 1 tablet (75 mg total) by mouth daily with breakfast. 30 tablet 11  . COD LIVER OIL PO Take 1 tablet by mouth every morning.    . docusate sodium 100 MG CAPS Take 100 mg by mouth 2 (two) times daily. 20 capsule 0  . ferrous sulfate 325 (65 FE) MG tablet Take 1 tablet (325 mg total) by mouth 3 (three) times daily after meals. 30 tablet 0  . hydrochlorothiazide (HYDRODIURIL) 25 MG tablet Take 25 mg by mouth daily after lunch.     . levothyroxine (SYNTHROID, LEVOTHROID) 112 MCG tablet Take 100 mcg by mouth daily before breakfast.     . methocarbamol (ROBAXIN) 500 MG tablet Take 1 tablet (500 mg total) by mouth every 6 (six) hours as needed for muscle spasms. 40 tablet 1  . Multiple Vitamin (MULTIVITAMIN WITH MINERALS) TABS tablet Take 1 tablet by mouth every morning. Centrum    . Omega-3 Fatty Acids (FISH OIL PO) Take 1 tablet by mouth every morning.    Marland Kitchen oxymetazoline (AFRIN) 0.05 % nasal spray Place 1-2 sprays into both nostrils daily as needed for congestion.    Marland Kitchen PRENATAL VITAMINS PO Take 1 capsule by mouth every morning.      Current Facility-Administered Medications on File Prior to Visit  Medication Dose Route Frequency Provider Last Rate Last Dose  . tranexamic acid (CYKLOKAPRON) 2,000 mg in sodium chloride 0.9 %  50 mL Topical Application  8,546 mg Topical Once Amber Renelda Loma, PA-C        Past Medical History  Diagnosis Date  . Coronary artery disease   . Hyperlipidemia   . History of rheumatic fever   . Anemia   . OSTEOPENIA   . Insomnia   . COLONIC POLYPS, HX OF   . Hypothyroidism   . Hypertension   . Gastroesophageal reflux disease   . PVD (peripheral vascular disease) 10/31/2011  . Vitamin D deficiency   . RSD (reflex sympathetic dystrophy) 10/31/2011  . PAD (peripheral artery disease) 12/03/2011    status post right SFA [place] atherectomy  . COPD (chronic obstructive pulmonary disease)     no per pt on 03/21/2014  . Osteoarthritis   .  DISC DISEASE, CERVICAL   . Arthritis     "my whole body"  . Heart murmur     as a child  . Anxiety     hx    Past Surgical History  Procedure Laterality Date  . Abdominal hysterectomy  1977    "partial"  . Thyroidectomy  1995  . Tonsillectomy    . Carpal tunnel release Right 2005  . Cataract extraction Left 2013  . Balloon angioplasty, artery Right 03/21/2014    SFA  . Dilation and curettage of uterus  1978  . Coronary artery bypass graft  10/2009    LIMA to the LAD, saphenous vein graft to the acute marginal, saphenous vein graft to the diagonal and obtuse marginal.  . Cardiac catheterization  2011   . Total knee arthroplasty Right 06/18/2014    Procedure: RIGHT TOTAL KNEE ARTHROPLASTY;  Surgeon: Tobi Bastos, MD;  Location: [facility];  Service: Orthopedics;  Laterality: Right;  . Lower extremity angiogram N/A 03/21/2014    Procedure: LOWER EXTREMITY ANGIOGRAM;  Surgeon: Lorretta Harp, MD;  Location: [facility] CATH LAB;  Service: Cardiovascular;  Laterality: N/A;    Family History  Problem Relation Age of Onset  . Arthritis Maternal Aunt   . Heart disease Maternal Aunt   . Hypertension Maternal Aunt   . Diabetes Maternal Aunt   . Colon cancer Neg Hx   . Kidney disease Neg Hx   . Liver disease Neg Hx   . Throat cancer Neg Hx   . Stomach cancer Neg Hx     History   Social History  . Marital Status: Single    Spouse Name: N/A    Number of Children: 2  . Years of Education: N/A   Occupational History  . [employer]    Social History Main Topics  . Smoking status: Former Smoker -- 0.80 packs/day for 45 years    Types: Cigarettes    Quit date: 08/23/2009  . Smokeless tobacco: Never Used  . Alcohol Use: No  . Drug Use: No  . Sexual Activity: Not Currently   Other Topics Concern  . Not on file   Social History Narrative     Review of Systems  Constitutional: Denies fever, chills, fatigue, or significant weight gain/loss. HENT: Head: Denies headache or neck  pain Ears: Denies changes in hearing, ringing in ears, earache, drainage Nose: Denies discharge, stuffiness, itching, nosebleed, sinus pain Throat: Denies sore throat, hoarseness, dry mouth, sores, thrush Eyes: Denies loss/changes in vision, pain, redness, blurry/double vision, flashing lights Cardiovascular: Denies chest pain/discomfort, tightness, palpitations, shortness of breath with activity, difficulty lying down, swelling, sudden awakening with shortness of breath Respiratory: Denies shortness of breath, cough, sputum  production, wheezing Gastrointestinal: Denies dysphasia, heartburn, change in appetite, nausea, change in bowel habits, rectal bleeding, constipation, diarrhea, yellow skin or eyes Genitourinary: Denies frequency, urgency, burning/pain, blood in urine, incontinence, change in urinary strength. Musculoskeletal: Denies muscle/joint pain (other than described below), stiffness, back pain, redness or swelling of joints, trauma Positive for right knee pain (total knee replacement) Skin: Denies rashes, lumps, itching, dryness, color changes, or hair/nail changes Neurological: Denies dizziness, fainting, seizures, weakness, numbness, tingling, tremor Psychiatric - Denies nervousness, stress, depression or memory loss Endocrine: Denies heat or cold intolerance, sweating, frequent urination, excessive thirst, changes in appetite Hematologic: Denies ease of bruising or bleeding     Objective:    BP 162/102 mmHg  Pulse 92  Temp(Src) 97.6 F (36.4 C) (Oral)  Resp 18  Ht 5\' 3"  (1.6 m)  Wt 119 lb 12.8 oz (54.341 kg)  BMI 21.23 kg/m2  SpO2 99% Nursing note and vital signs reviewed.  Physical Exam  Constitutional: She is oriented to person, place, and time. She appears well-developed and well-nourished.  HENT:  Head: Normocephalic.  Right Ear: Hearing, tympanic membrane, external ear and ear canal normal.  Left Ear: Hearing, tympanic membrane, external ear and ear canal  normal.  Nose: Nose normal.  Mouth/Throat: Uvula is midline, oropharynx is clear and moist and mucous membranes are normal.  Eyes: Conjunctivae and EOM are normal. Pupils are equal, round, and reactive to light.  Neck: Neck supple. No JVD present. No tracheal deviation present. No thyromegaly present.  Cardiovascular: Normal rate, regular rhythm, normal heart sounds and intact distal pulses.   Pulmonary/Chest: Effort normal and breath sounds normal.  Abdominal: Soft. Bowel sounds are normal. She exhibits no distension and no mass. There is no tenderness. There is no rebound and no guarding.  Musculoskeletal: Normal range of motion. She exhibits no edema or tenderness.  Lymphadenopathy:    She has no cervical adenopathy.  Neurological: She is alert and oriented to person, place, and time. She has normal reflexes. No cranial nerve deficit. She exhibits normal muscle tone. Coordination normal.  Skin: Skin is warm and dry.  Psychiatric: She has a normal mood and affect. Her behavior is normal. Judgment and thought content normal.       Assessment & Plan:

## 2014-09-16 DIAGNOSIS — Z471 Aftercare following joint replacement surgery: Secondary | ICD-10-CM | POA: Diagnosis not present

## 2014-09-16 DIAGNOSIS — Z96651 Presence of right artificial knee joint: Secondary | ICD-10-CM | POA: Diagnosis not present

## 2014-09-20 DIAGNOSIS — S0500XA Injury of conjunctiva and corneal abrasion without foreign body, unspecified eye, initial encounter: Secondary | ICD-10-CM | POA: Diagnosis not present

## 2014-09-20 DIAGNOSIS — H2511 Age-related nuclear cataract, right eye: Secondary | ICD-10-CM | POA: Diagnosis not present

## 2014-09-20 DIAGNOSIS — H04123 Dry eye syndrome of bilateral lacrimal glands: Secondary | ICD-10-CM | POA: Diagnosis not present

## 2014-09-20 DIAGNOSIS — H18413 Arcus senilis, bilateral: Secondary | ICD-10-CM | POA: Diagnosis not present

## 2014-09-20 DIAGNOSIS — Z961 Presence of intraocular lens: Secondary | ICD-10-CM | POA: Diagnosis not present

## 2014-09-20 DIAGNOSIS — H25011 Cortical age-related cataract, right eye: Secondary | ICD-10-CM | POA: Diagnosis not present

## 2014-09-20 DIAGNOSIS — Z9842 Cataract extraction status, left eye: Secondary | ICD-10-CM | POA: Diagnosis not present

## 2014-09-20 DIAGNOSIS — H11153 Pinguecula, bilateral: Secondary | ICD-10-CM | POA: Diagnosis not present

## 2014-09-24 ENCOUNTER — Ambulatory Visit
Admission: RE | Admit: 2014-09-24 | Discharge: 2014-09-24 | Disposition: A | Discharge status: Home / Self Care | Payer: 59 | Source: Ambulatory Visit | Attending: Thoracic Surgery (Cardiothoracic Vascular Surgery) | Admitting: Thoracic Surgery (Cardiothoracic Vascular Surgery)

## 2014-09-24 ENCOUNTER — Encounter: Payer: Self-pay | Admitting: Thoracic Surgery (Cardiothoracic Vascular Surgery)

## 2014-09-24 ENCOUNTER — Ambulatory Visit (HOSPITAL_COMMUNITY)
Admission: RE | Admit: 2014-09-24 | Discharge: 2014-09-24 | Disposition: A | Discharge status: Home / Self Care | Payer: Medicare Other | Source: Ambulatory Visit | Attending: Cardiovascular Disease | Admitting: Cardiovascular Disease

## 2014-09-24 ENCOUNTER — Ambulatory Visit (INDEPENDENT_AMBULATORY_CARE_PROVIDER_SITE_OTHER): Payer: 59 | Admitting: Thoracic Surgery (Cardiothoracic Vascular Surgery)

## 2014-09-24 VITALS — BP 160/88 | HR 86 | Resp 20 | Ht 63.0 in | Wt 120.0 lb

## 2014-09-24 DIAGNOSIS — R918 Other nonspecific abnormal finding of lung field: Secondary | ICD-10-CM | POA: Diagnosis not present

## 2014-09-24 DIAGNOSIS — J432 Centrilobular emphysema: Secondary | ICD-10-CM | POA: Diagnosis not present

## 2014-09-24 DIAGNOSIS — I739 Peripheral vascular disease, unspecified: Secondary | ICD-10-CM

## 2014-09-24 DIAGNOSIS — R05 Cough: Secondary | ICD-10-CM | POA: Diagnosis not present

## 2014-09-24 DIAGNOSIS — Z87891 Personal history of nicotine dependence: Secondary | ICD-10-CM | POA: Diagnosis not present

## 2014-09-24 DIAGNOSIS — R911 Solitary pulmonary nodule: Secondary | ICD-10-CM

## 2014-09-24 NOTE — Progress Notes (Signed)
Lower Extremity Arterial Duplex Completed. °Brianna L Mazza,RVT °

## 2014-09-24 NOTE — Progress Notes (Signed)
HPI:  [name] returns today for a scheduled 6 month follow-up visit.  She is a 69 year old woman with a past history of tobacco abuse. She smoked about a pack of cigarettes a day for 40-45 years. She quit smoking after her bypass surgery in 2011 after she had coronary bypass grafting.   Last summer she was being evaluated for left arm pain and numbness. She was found to have cervical spine disease. A questionable apical mass was noted also. This led to a CT of the chest. It showed bilateral apical parenchymal densities. The one on the right had enlarged since her last scan. She had a PET/CT, which showed no convincing hypermetabolism relative to the opposite side. It was felt to likely be benign, but I felt like we should continue to follow that to be sure.  PET/CT did note some mucosal thickening and mild hypermetabolism in her distal esophagus. She has a history of severe reflux.  She says that since her last visit she had a knee replacement. She was in rehabilitation back in November but is back at home now. She still is using a cane at times for balance. She has not had any issues with her breathing. She does complain of some upper back pain and also frequent belching and heartburn.  Past Medical History  Diagnosis Date  . Coronary artery disease   . Hyperlipidemia   . History of rheumatic fever   . Anemia   . OSTEOPENIA   . Insomnia   . COLONIC POLYPS, HX OF   . Hypothyroidism   . Hypertension   . Gastroesophageal reflux disease   . PVD (peripheral vascular disease) 10/31/2011  . Vitamin D deficiency   . RSD (reflex sympathetic dystrophy) 10/31/2011  . PAD (peripheral artery disease) 12/03/2011    status post right SFA [place] atherectomy  . COPD (chronic obstructive pulmonary disease)     no per pt on 03/21/2014  . Osteoarthritis   . DISC DISEASE, CERVICAL   . Arthritis     "my whole body"  . Heart murmur     as a child  . Anxiety     hx      Current Outpatient  Prescriptions  Medication Sig Dispense Refill  . amLODipine (NORVASC) 10 MG tablet Take 10 mg by mouth at bedtime.     Marland Kitchen aspirin 325 MG tablet Take 325 mg by mouth daily.    . Aspirin-Salicylamide-Caffeine (BC HEADACHE POWDER PO) Take 1 packet by mouth 3 (three) times daily as needed (headache).    . Calcium Carb-Cholecalciferol (CALCIUM 1000 + D PO) Take 1 tablet by mouth daily at 2 PM daily at 2 PM.     . COD LIVER OIL PO Take 1 tablet by mouth every morning.    . docusate sodium 100 MG CAPS Take 100 mg by mouth 2 (two) times daily. 20 capsule 0  . hydrochlorothiazide (HYDRODIURIL) 25 MG tablet Take 25 mg by mouth daily after lunch.     . levothyroxine (SYNTHROID, LEVOTHROID) 100 MCG tablet Take 1 tablet (100 mcg total) by mouth daily before breakfast. 30 tablet 3  . methocarbamol (ROBAXIN) 500 MG tablet Take 1 tablet (500 mg total) by mouth every 6 (six) hours as needed for muscle spasms. 40 tablet 1  . Multiple Vitamin (MULTIVITAMIN WITH MINERALS) TABS tablet Take 1 tablet by mouth every morning. Centrum    . Omega-3 Fatty Acids (FISH OIL PO) Take 1 tablet by mouth every morning.    Marland Kitchen  PRENATAL VITAMINS PO Take 1 capsule by mouth every morning.     . traMADol (ULTRAM) 50 MG tablet Take by mouth every 6 (six) hours as needed.     No current facility-administered medications for this visit.   Facility-Administered Medications Ordered in Other Visits  Medication Dose Route Frequency Provider Last Rate Last Dose  . tranexamic acid (CYKLOKAPRON) 2,000 mg in sodium chloride 0.9 % 50 mL Topical Application  8,338 mg Topical Once Amber Lauren Cecilio Asper, PA-C        Physical Exam BP 160/88 mmHg  Pulse 86  Resp 20  Ht 5\' 3"  (1.6 m)  Wt 120 lb (54.432 kg)  BMI 21.26 kg/m2  SpO70 59% 69 year old woman in no acute distress Well-developed well-nourished Alert and oriented 3 with no focal deficits Lungs diminished breath sounds bilaterally, no wheezing Cardiac regular rate and rhythm normal S1  and S2  Diagnostic Tests: CT CHEST WITHOUT CONTRAST  TECHNIQUE: Multidetector CT imaging of the chest was performed following the standard protocol without IV contrast.  COMPARISON: 03/19/2014 plain film. 02/08/14 PET. 02/08/14 chest CT.  FINDINGS: Mediastinum/Nodes: Thyroidectomy. Prior median sternotomy. Upper normal heart size, without pericardial effusion. No mediastinal or definite hilar adenopathy, given limitations of unenhanced CT. Esophageal fluid level on image 30.  Lungs/Pleura: Mild to moderate centrilobular emphysema. Biapical pleural parenchymal scarring. Slightly greater on the right. The previously measured component is unchanged, including on image 10 of series 4.  Right upper lobe 2 mm pulmonary nodules x2 on images 28-29. Similar back to 11/05/2009, benign.  Calcified granuloma within the right upper lobe on image 38.  4 mm right lower lobe pulmonary nodule is unchanged back to the 2011, including on image 47 today.  Vague right lower lobe subpleural 4 mm nodule on image 32 is felt to be similar and likely a subpleural lymph node.  2 mm left lower lobe pulmonary nodule on image 35 is similar to on the prior exam.  No pleural fluid.  Upper abdomen: Right hepatic lobe cyst. Left renal collecting system punctate calculi. Normal imaged portions of the spleen, stomach, pancreas, adrenal glands.  Musculoskeletal: Anterolisthesis of C7 on T1 measures 4 mm and is similar to on the prior exam.  IMPRESSION: 1. Similar appearance of centrilobular emphysema and biapical pleural parenchymal scarring. 2. Centrilobular emphysema with similar scattered small nodules. Likely subpleural lymph nodes. 3. Esophageal air fluid level suggests dysmotility or gastroesophageal reflux. 4. Left nephrolithiasis. 5. Similar 4 mm of C7 on T1 anterolisthesis. Suboptimally characterized.   Electronically Signed  By: Abigail Miyamoto M.D.  On: 09/24/2014  12:48  Impression: 69 year old woman with a nodular density in the right apex. She also has multiple small subcentimeter nodules in the lungs. These lesions are all unchanged from her most recent CT. We will plan to follow her with CT scans every 6 months out to 2 years to make sure that this right apical area remains unchanged.  She's having a lot of problems with reflux and belching. Her PET/CT did show some mucosal thickening and hypermetabolism. This likely is esophagitis. I'm going to refer her to gastroenterology to evaluate that issue and see if there is something that can be done to help her symptoms. She gets minimal relief from H2 blockers or PPI.  Plan: Referral to gastroenterology  Return in 6 months with CT of chest  Greater than 15 minutes of face-to-face time  Montez Morita.D.

## 2014-10-01 ENCOUNTER — Ambulatory Visit (INDEPENDENT_AMBULATORY_CARE_PROVIDER_SITE_OTHER)
Admission: RE | Admit: 2014-10-01 | Discharge: 2014-10-01 | Disposition: A | Discharge status: Home / Self Care | Payer: Medicare Other | Source: Ambulatory Visit | Attending: Internal Medicine | Admitting: Internal Medicine

## 2014-10-01 DIAGNOSIS — M899 Disorder of bone, unspecified: Secondary | ICD-10-CM | POA: Diagnosis not present

## 2014-10-01 DIAGNOSIS — M8580 Other specified disorders of bone density and structure, unspecified site: Secondary | ICD-10-CM

## 2014-10-03 ENCOUNTER — Encounter: Payer: Self-pay | Admitting: Internal Medicine

## 2014-10-03 ENCOUNTER — Ambulatory Visit (INDEPENDENT_AMBULATORY_CARE_PROVIDER_SITE_OTHER): Payer: 59 | Admitting: Internal Medicine

## 2014-10-03 VITALS — BP 160/80 | HR 94 | Temp 98.2°F | Ht 63.0 in | Wt 120.0 lb

## 2014-10-03 DIAGNOSIS — I10 Essential (primary) hypertension: Secondary | ICD-10-CM

## 2014-10-03 DIAGNOSIS — K0510 Chronic gingivitis, plaque induced: Secondary | ICD-10-CM

## 2014-10-03 DIAGNOSIS — M546 Pain in thoracic spine: Secondary | ICD-10-CM | POA: Diagnosis not present

## 2014-10-03 MED ORDER — TIZANIDINE HCL 4 MG PO TABS
4.0000 mg | ORAL_TABLET | Freq: Four times a day (QID) | ORAL | Status: DC | PRN
Start: 2014-10-03 — End: 2015-09-24

## 2014-10-03 MED ORDER — KETOROLAC TROMETHAMINE 60 MG/2ML IM SOLN
60.0000 mg | Freq: Once | INTRAMUSCULAR | Status: AC
Start: 2014-10-03 — End: 2014-10-03
  Administered 2014-10-03: 30 mg via INTRAMUSCULAR

## 2014-10-03 MED ORDER — AMOXICILLIN 500 MG PO CAPS: 1000.0000 mg | ORAL_CAPSULE | Freq: Two times a day (BID) | ORAL | Status: DC

## 2014-10-03 NOTE — Progress Notes (Signed)
Pre visit review using our clinic review tool, if applicable. No additional management support is needed unless otherwise documented below in the visit note. 

## 2014-10-03 NOTE — Assessment & Plan Note (Signed)
Persistent elevated, o/w stable overall by history and exam, recent data reviewed with pt, and pt to continue medical treatment as before - adamant no med changes today,  to f/u any worsening symptoms or concerns BP Readings from Last 3 Encounters:  10/03/14 160/80  09/24/14 160/88  09/10/14 162/102

## 2014-10-03 NOTE — Assessment & Plan Note (Signed)
Mild, for amoxil course, f/u with dental or here for any worsening such as abscess

## 2014-10-03 NOTE — Addendum Note (Signed)
Addended by: Biagio Borg on: 10/03/2014 02:31 PM   Modules accepted: Orders, Level of Service, SmartSet

## 2014-10-03 NOTE — Progress Notes (Addendum)
Subjective:    Patient ID: [name], female    DOB: September 14, 1945, 69 y.o.   MRN: [id number]  HPI  Here with pain to left upper back and periscapular, mod to severe, no known inciting event such as lifting. Worse to move left arm and shoulder, but no neck pain, no radicular pain or extremitiey weakness or numbness.  Does have occas pains at the elbows but no swelling and not functinally changing. Current left upper back pain 7/10, dull and sharp, constant. Not better with tramadol at home. Not tried muscle relaxer.  Also with recurrent constipatoin, better with prune juice., last colonoscopy 2009, f/u at 8 yrs. Pt denies chest pain, increased sob or doe, wheezing, orthopnea, PND, increased LE swelling, palpitations, dizziness or syncope. No fever, cough, wheezing.  Does not want hydrocodone for pain, but muscle relaxer ok . Also mentions increased dental pain and swelling assoc with poor dentition, needs several teeth pulled but cannot afford at this time, better last yr with a course of antibx. Past Medical History  Diagnosis Date  . Coronary artery disease   . Hyperlipidemia   . History of rheumatic fever   . Anemia   . OSTEOPENIA   . Insomnia   . COLONIC POLYPS, HX OF   . Hypothyroidism   . Hypertension   . Gastroesophageal reflux disease   . PVD (peripheral vascular disease) 10/31/2011  . Vitamin D deficiency   . RSD (reflex sympathetic dystrophy) 10/31/2011  . PAD (peripheral artery disease) 12/03/2011    status post right SFA [place] atherectomy  . COPD (chronic obstructive pulmonary disease)     no per pt on 03/21/2014  . Osteoarthritis   . DISC DISEASE, CERVICAL   . Arthritis     "my whole body"  . Heart murmur     as a child  . Anxiety     hx   Past Surgical History  Procedure Laterality Date  . Abdominal hysterectomy  1977    "partial"  . Thyroidectomy  1995  . Tonsillectomy    . Carpal tunnel release Right 2005  . Cataract extraction Left 2013  . Balloon  angioplasty, artery Right 03/21/2014    SFA  . Dilation and curettage of uterus  1978  . Coronary artery bypass graft  10/2009    LIMA to the LAD, saphenous vein graft to the acute marginal, saphenous vein graft to the diagonal and obtuse marginal.  . Cardiac catheterization  2011   . Total knee arthroplasty Right 06/18/2014    Procedure: RIGHT TOTAL KNEE ARTHROPLASTY;  Surgeon: Tobi Bastos, MD;  Location: [facility];  Service: Orthopedics;  Laterality: Right;  . Lower extremity angiogram N/A 03/21/2014    Procedure: LOWER EXTREMITY ANGIOGRAM;  Surgeon: Lorretta Harp, MD;  Location: [facility] CATH LAB;  Service: Cardiovascular;  Laterality: N/A;    reports that she quit smoking about 5 years ago. Her smoking use included Cigarettes. She has a 36 pack-year smoking history. She has never used smokeless tobacco. She reports that she does not drink alcohol or use illicit drugs. family history includes Arthritis in her maternal aunt; Diabetes in her maternal aunt; Heart disease in her maternal aunt; Hypertension in her maternal aunt. There is no history of Colon cancer, Kidney disease, Liver disease, Throat cancer, or Stomach cancer. Allergies  Allergen Reactions  . Lipitor [Atorvastatin] Other (See Comments)    Muscle aches   Current Outpatient Prescriptions on File Prior to Visit  Medication Sig Dispense  Refill  . amLODipine (NORVASC) 10 MG tablet Take 10 mg by mouth at bedtime.     Marland Kitchen aspirin 325 MG tablet Take 325 mg by mouth daily.    . Aspirin-Salicylamide-Caffeine (BC HEADACHE POWDER PO) Take 1 packet by mouth 3 (three) times daily as needed (headache).    . Calcium Carb-Cholecalciferol (CALCIUM 1000 + D PO) Take 1 tablet by mouth daily at 2 PM daily at 2 PM.     . COD LIVER OIL PO Take 1 tablet by mouth every morning.    . docusate sodium 100 MG CAPS Take 100 mg by mouth 2 (two) times daily. 20 capsule 0  . hydrochlorothiazide (HYDRODIURIL) 25 MG tablet Take 25 mg by mouth daily after lunch.      . levothyroxine (SYNTHROID, LEVOTHROID) 100 MCG tablet Take 1 tablet (100 mcg total) by mouth daily before breakfast. 30 tablet 3  . methocarbamol (ROBAXIN) 500 MG tablet Take 1 tablet (500 mg total) by mouth every 6 (six) hours as needed for muscle spasms. 40 tablet 1  . Multiple Vitamin (MULTIVITAMIN WITH MINERALS) TABS tablet Take 1 tablet by mouth every morning. Centrum    . Omega-3 Fatty Acids (FISH OIL PO) Take 1 tablet by mouth every morning.    Marland Kitchen PRENATAL VITAMINS PO Take 1 capsule by mouth every morning.     . traMADol (ULTRAM) 50 MG tablet Take by mouth every 6 (six) hours as needed.     Current Facility-Administered Medications on File Prior to Visit  Medication Dose Route Frequency Provider Last Rate Last Dose  . tranexamic acid (CYKLOKAPRON) 2,000 mg in sodium chloride 0.9 % 50 mL Topical Application  2,831 mg Topical Once Amber Renelda Loma, PA-C        Review of Systems  Constitutional: Negative for unusual diaphoresis or other sweats  HENT: Negative for ringing in ear Eyes: Negative for double vision or worsening visual disturbance.  Respiratory: Negative for choking and stridor.   Gastrointestinal: Negative for vomiting or other signifcant bowel change Genitourinary: Negative for hematuria or decreased urine volume.  Musculoskeletal: Negative for other MSK pain or swelling Skin: Negative for color change and worsening wound.  Neurological: Negative for tremors and numbness other than noted  Psychiatric/Behavioral: Negative for decreased concentration or agitation other than above       Objective:   Physical Exam BP 160/80 mmHg  Pulse 94  Temp(Src) 98.2 F (36.8 C) (Oral)  Ht 5\' 3"  (1.6 m)  Wt 120 lb (54.432 kg)  BMI 21.26 kg/m2\ VS noted,  Constitutional: Pt appears well-developed, well-nourished.  HENT: Head: NCAT.  Right Ear: External ear normal.  Left Ear: External ear normal.  Eyes: . Pupils are equal, round, and reactive to light. Conjunctivae and  EOM are normal + poor dentition, gum swelling/tedner, no overt abscess Neck: Normal range of motion. Neck supple.  Cardiovascular: Normal rate and regular rhythm.   Pulmonary/Chest: Effort normal and breath sounds without rales or wheezing.  Neurological: Pt is alert. Not confused , motor grossly intact Skin: Skin is warm. No rash Psychiatric: Pt behavior is normal. No agitation.  Left periscapular and trapezoid area with mod tender, no skin change or rash, no swelling    Assessment & Plan:

## 2014-10-03 NOTE — Patient Instructions (Addendum)
Please take all new medication as prescribed - the antibiotic  Please take all new medication as prescribed - the muscle relaxer as needed  You had the pain shot today (toradol)  Please continue all other medications as before, and refills have been done if requested.  Please have the pharmacy call with any other refills you may need.  Please keep your appointments with your specialists as you may have planned                  To Log into My Chart online, please go by [facility] or [employer] to [email or web address], or download the MyChart App from the [employer]. Your Username is: [email or web address] (password [email or web address])

## 2014-10-03 NOTE — Addendum Note (Signed)
Addended by: Townsend Roger D on: 10/03/2014 03:20 PM   Modules accepted: Orders

## 2014-10-03 NOTE — Assessment & Plan Note (Signed)
Suspect msk strain, no neuro changes, no other overt PE findings, for tizanidine prn, cont tramadol, ok to toradol 30 mg IM x 1 now per pt reqeust

## 2014-10-04 ENCOUNTER — Telehealth: Payer: Self-pay | Admitting: Internal Medicine

## 2014-10-04 NOTE — Telephone Encounter (Signed)
emmi emailed °

## 2014-10-07 ENCOUNTER — Telehealth: Payer: Self-pay | Admitting: Family

## 2014-10-07 NOTE — Telephone Encounter (Signed)
Please inform the patient that her bone mineral density test shows that she has decreased bone mineral density otherwise known as osteopenia. The treatment for this it to optimizie calcium (1200 mg/day) and vitamin D (800 IU/day) intake. We can plan to re-evaluate in about 2 years.

## 2014-10-07 NOTE — Telephone Encounter (Signed)
Pt informed of results and recommendation.

## 2014-10-10 ENCOUNTER — Telehealth: Payer: Self-pay | Admitting: *Deleted

## 2014-10-10 NOTE — Telephone Encounter (Signed)
PATIENT CALLED TO SCHEDULE APPOINTMENT THAT WAS SET UP WITH DR MEDOFF ON 10/25/14, SHE STATED THAT SHE IS GOING TO DR Ardis Hughs WITH [facility]

## 2014-11-02 ENCOUNTER — Ambulatory Visit (INDEPENDENT_AMBULATORY_CARE_PROVIDER_SITE_OTHER): Payer: Medicare Other | Admitting: Family Medicine

## 2014-11-02 ENCOUNTER — Encounter: Payer: Self-pay | Admitting: Family Medicine

## 2014-11-02 VITALS — BP 158/78 | HR 68 | Temp 98.3°F | Resp 16 | Ht 63.0 in | Wt 121.0 lb

## 2014-11-02 DIAGNOSIS — K644 Residual hemorrhoidal skin tags: Secondary | ICD-10-CM | POA: Insufficient documentation

## 2014-11-02 DIAGNOSIS — K648 Other hemorrhoids: Secondary | ICD-10-CM

## 2014-11-02 NOTE — Assessment & Plan Note (Signed)
okay to continue ASA for now.  D/w pt.  Add on colace and avoid straining.  Okay to use hemorrhoid wipes in meantime.  F/u prn.  She agrees.

## 2014-11-02 NOTE — Patient Instructions (Signed)
Keep taking aspirin for now.  Add on colace 100mg  a day to keep your stools soft.  Avoid straining.  Use OTC hemorrhoid wipes.  Notify Dr. Jenny Reichmann if you keep having troubles.   Glad to see you.

## 2014-11-02 NOTE — Progress Notes (Signed)
Pre visit review using our clinic review tool, if applicable. No additional management support is needed unless otherwise documented below in the visit note.  "I've always had trouble with my bowels.  Taking prunes may have something to do with this."  On ASA for CAD with prev colonoscopy done with diverticulosis noted.   She had some cramping and nausea and the a bloody BM on 10/30/14.  No dark stools at all.   No more bleeding until last night, again bright red.  Use used a hemorrhoid suppository last night.   No pain with the BM itself, none at the rectum.   She has had to strain for BMs.  No known external hemorrhoids.    Not lightheaded.  No CP, not SOB.    Meds, vitals, and allergies reviewed.   ROS: See HPI.  Otherwise, noncontributory.  nad ncat rrr ctab abd soft, not ttp Chaperoned exam with small ext hemorrhoid with a small amount of gross blood.  No active bleeding.

## 2014-11-08 ENCOUNTER — Other Ambulatory Visit (HOSPITAL_COMMUNITY): Payer: Self-pay | Admitting: Orthopedic Surgery

## 2014-11-08 DIAGNOSIS — M79661 Pain in right lower leg: Secondary | ICD-10-CM

## 2014-11-09 ENCOUNTER — Ambulatory Visit (HOSPITAL_COMMUNITY)
Admission: RE | Admit: 2014-11-09 | Discharge: 2014-11-09 | Disposition: A | Discharge status: Home / Self Care | Payer: Medicare Other | Source: Ambulatory Visit | Attending: Internal Medicine | Admitting: Internal Medicine

## 2014-11-09 DIAGNOSIS — M79661 Pain in right lower leg: Secondary | ICD-10-CM

## 2014-11-09 DIAGNOSIS — M79604 Pain in right leg: Secondary | ICD-10-CM | POA: Insufficient documentation

## 2014-11-09 NOTE — Progress Notes (Signed)
VASCULAR LAB PRELIMINARY  PRELIMINARY  PRELIMINARY  PRELIMINARY  Right lower extremity venous Doppler completed.    Preliminary report:  There is no DVT or SVT noted in the right lower extremity.   Teandre Hamre, RVT 11/09/2014, 9:29 AM

## 2014-11-20 ENCOUNTER — Other Ambulatory Visit: Payer: Self-pay | Admitting: Internal Medicine

## 2014-11-20 ENCOUNTER — Telehealth: Payer: Self-pay | Admitting: Internal Medicine

## 2014-11-20 DIAGNOSIS — R195 Other fecal abnormalities: Secondary | ICD-10-CM

## 2014-11-20 NOTE — Telephone Encounter (Signed)
Notified pt with md response. Pt states that [name] had contacted her to see if she was having sxs she explain that her hemorrhoid had flared & she was straining too. Have already received appt with Dr. Ardis Hughs will keep for appt...[name]

## 2014-11-20 NOTE — Telephone Encounter (Signed)
cherina to contact pt   We received a note from [facility] that her stool tested + for blood  Last colonscopy 2009  We will refer to Dr Ardis Hughs for further consideration

## 2014-12-16 DIAGNOSIS — Z96651 Presence of right artificial knee joint: Secondary | ICD-10-CM | POA: Diagnosis not present

## 2014-12-16 DIAGNOSIS — Z471 Aftercare following joint replacement surgery: Secondary | ICD-10-CM | POA: Diagnosis not present

## 2014-12-18 ENCOUNTER — Ambulatory Visit: Payer: Medicare Other | Admitting: Gastroenterology

## 2015-01-29 ENCOUNTER — Emergency Department (INDEPENDENT_AMBULATORY_CARE_PROVIDER_SITE_OTHER)
Admission: EM | Admit: 2015-01-29 | Discharge: 2015-01-29 | Disposition: A | Discharge status: Home / Self Care | Payer: Medicare Other | Source: Home / Self Care | Attending: Emergency Medicine | Admitting: Emergency Medicine

## 2015-01-29 ENCOUNTER — Encounter (HOSPITAL_COMMUNITY): Payer: Self-pay | Admitting: Emergency Medicine

## 2015-01-29 DIAGNOSIS — R42 Dizziness and giddiness: Secondary | ICD-10-CM | POA: Diagnosis not present

## 2015-01-29 LAB — POCT I-STAT, CHEM 8
BUN: 12 mg/dL (ref 6–20)
Calcium, Ion: 1.3 mmol/L (ref 1.13–1.30)
Chloride: 104 mmol/L (ref 101–111)
Creatinine, Ser: 0.6 mg/dL (ref 0.44–1.00)
Glucose, Bld: 108 mg/dL — ABNORMAL HIGH (ref 65–99)
HCT: 42 % (ref 36.0–46.0)
Hemoglobin: 14.3 g/dL (ref 12.0–15.0)
Potassium: 4.2 mmol/L (ref 3.5–5.1)
Sodium: 142 mmol/L (ref 135–145)
TCO2: 24 mmol/L (ref 0–100)

## 2015-01-29 MED ORDER — MECLIZINE HCL 25 MG PO TABS: 25.0000 mg | ORAL_TABLET | Freq: Three times a day (TID) | ORAL | Status: DC | PRN

## 2015-01-29 NOTE — ED Provider Notes (Signed)
CSN: [id number]     Arrival date & time 01/29/15  1302 History   First MD Initiated Contact with Patient 01/29/15 1326     Chief Complaint  Patient presents with  . Dizziness   (Consider location/radiation/quality/duration/timing/severity/associated sxs/prior Treatment) HPI She is a 69 year old woman here for evaluation of dizziness. She states that yesterday she started having brief episodes of dizziness. She states she woke up feeling well. She went to a cookout and started having brief episodes of dizziness anytime she stood up. The dizziness is described as lightheaded and her eyes crossing. This was associated with some nausea and feeling off balance. Episodes resolved within a few minutes. She denies any associated chest pain or shortness of breath. No associated diaphoresis. After several episodes, they seem to resolve until later yesterday afternoon when they recurred. Again, they typically occurred with position changes.  No change in hearing or tinnitus. She has a history of hypertension, and has been taking her medications as prescribed. She also has a history of quadruple bypass surgery and peripheral vascular disease.  Past Medical History  Diagnosis Date  . Coronary artery disease   . Hyperlipidemia   . History of rheumatic fever   . Anemia   . OSTEOPENIA   . Insomnia   . COLONIC POLYPS, HX OF   . Hypothyroidism   . Hypertension   . Gastroesophageal reflux disease   . PVD (peripheral vascular disease) 10/31/2011  . Vitamin D deficiency   . RSD (reflex sympathetic dystrophy) 10/31/2011  . PAD (peripheral artery disease) 12/03/2011    status post right SFA [place] atherectomy  . COPD (chronic obstructive pulmonary disease)     no per pt on 03/21/2014  . Osteoarthritis   . DISC DISEASE, CERVICAL   . Arthritis     "my whole body"  . Heart murmur     as a child  . Anxiety     hx   Past Surgical History  Procedure Laterality Date  . Abdominal hysterectomy  1977   "partial"  . Thyroidectomy  1995  . Tonsillectomy    . Carpal tunnel release Right 2005  . Cataract extraction Left 2013  . Balloon angioplasty, artery Right 03/21/2014    SFA  . Dilation and curettage of uterus  1978  . Coronary artery bypass graft  10/2009    LIMA to the LAD, saphenous vein graft to the acute marginal, saphenous vein graft to the diagonal and obtuse marginal.  . Cardiac catheterization  2011   . Total knee arthroplasty Right 06/18/2014    Procedure: RIGHT TOTAL KNEE ARTHROPLASTY;  Surgeon: Tobi Bastos, MD;  Location: [facility];  Service: Orthopedics;  Laterality: Right;  . Lower extremity angiogram N/A 03/21/2014    Procedure: LOWER EXTREMITY ANGIOGRAM;  Surgeon: Lorretta Harp, MD;  Location: [facility] CATH LAB;  Service: Cardiovascular;  Laterality: N/A;   Family History  Problem Relation Age of Onset  . Arthritis Maternal Aunt   . Heart disease Maternal Aunt   . Hypertension Maternal Aunt   . Diabetes Maternal Aunt   . Colon cancer Neg Hx   . Kidney disease Neg Hx   . Liver disease Neg Hx   . Throat cancer Neg Hx   . Stomach cancer Neg Hx    History  Substance Use Topics  . Smoking status: Former Smoker -- 0.80 packs/day for 45 years    Types: Cigarettes    Quit date: 08/23/2009  . Smokeless tobacco: Never Used  .  Alcohol Use: No   OB History    No data available     Review of Systems As in history of present illness Allergies  Lipitor  Home Medications   Prior to Admission medications   Medication Sig Start Date End Date Taking? Authorizing Provider  amLODipine (NORVASC) 10 MG tablet Take 10 mg by mouth at bedtime.    Yes Historical Provider, MD  aspirin 325 MG tablet Take 325 mg by mouth daily.   Yes Historical Provider, MD  Calcium Carb-Cholecalciferol (CALCIUM 1000 + D PO) Take 1 tablet by mouth daily at 2 PM daily at 2 PM.    Yes Historical Provider, MD  hydrochlorothiazide (HYDRODIURIL) 25 MG tablet Take 25 mg by mouth daily after lunch.   10/28/11  Yes Biagio Borg, MD  levothyroxine (SYNTHROID, LEVOTHROID) 100 MCG tablet Take 1 tablet (100 mcg total) by mouth daily before breakfast. 09/10/14  Yes Golden Circle, FNP  amLODipine (NORVASC) 10 MG tablet TAKE ONE TABLET BY MOUTH ONCE DAILY 11/21/14   Biagio Borg, MD  Aspirin-Salicylamide-Caffeine [facility] HEADACHE POWDER PO) Take 1 packet by mouth 3 (three) times daily as needed (headache).    Historical Provider, MD  docusate sodium 100 MG CAPS Take 100 mg by mouth 2 (two) times daily. 06/20/14   Amber Constable, PA-C  meclizine (ANTIVERT) 25 MG tablet Take 1 tablet (25 mg total) by mouth 3 (three) times daily as needed for dizziness. 01/29/15   Melony Overly, MD  methocarbamol (ROBAXIN) 500 MG tablet Take 1 tablet (500 mg total) by mouth every 6 (six) hours as needed for muscle spasms. 06/20/14   Amber Cecilio Asper, PA-C  Multiple Vitamin (MULTIVITAMIN WITH MINERALS) TABS tablet Take 1 tablet by mouth every morning. Centrum    Historical Provider, MD  PRENATAL VITAMINS PO Take 1 capsule by mouth every morning.     Historical Provider, MD  tiZANidine (ZANAFLEX) 4 MG tablet Take 1 tablet (4 mg total) by mouth every 6 (six) hours as needed for muscle spasms. 10/03/14   Biagio Borg, MD  traMADol (ULTRAM) 50 MG tablet Take by mouth every 6 (six) hours as needed.    Historical Provider, MD   BP 171/95 mmHg  Pulse 78  Temp(Src) 97.9 F (36.6 C) (Oral)  Resp 12  SpO2 95% Physical Exam  Constitutional: She is oriented to person, place, and time. She appears well-developed and well-nourished. No distress.  HENT:  Right Ear: Tympanic membrane normal.  Left Ear: Tympanic membrane normal.  Eyes: Conjunctivae are normal.  Neck: Neck supple.  Cardiovascular: Normal rate, regular rhythm and normal heart sounds.   No murmur heard. Pulmonary/Chest: Effort normal and breath sounds normal. No respiratory distress. She has no wheezes. She has no rales.  Neurological: She is alert and oriented to person,  place, and time.    ED Course  Procedures (including critical care time) ED ECG REPORT   Date: 01/29/2015  Rate: 74  Rhythm: normal sinus rhythm  QRS Axis: normal  Intervals: normal  ST/T Wave abnormalities: normal  Conduction Disutrbances:none  Narrative Interpretation: NSR with poor R wave progression, stable from previous  Old EKG Reviewed: unchanged  I have personally reviewed the EKG tracing and agree with the computerized printout as noted.  Labs Review Labs Reviewed  POCT I-STAT, CHEM 8 - Abnormal; Notable for the following:    Glucose, Bld 108 (*)    All other components within normal limits    Imaging Review No results found.  MDM   1. Vertigo    History is most consistent with BPPV. Handout on Epley maneuvers given. Treat with meclizine as needed. Follow-up with PCP in one week. Return precautions reviewed.    Melony Overly, MD 01/29/15 [phone]

## 2015-01-29 NOTE — Discharge Instructions (Signed)
You have something called vertigo. Take meclizine 3 times a day for the next 2 days, then 3 times a day as needed. Do the Epley maneuver at home. Please follow-up with your regular doctor in 1 week for a recheck. If you develop chest pain, the dizzy spells are getting worse, or you actually pass out, please go to the emergency room.

## 2015-01-29 NOTE — ED Notes (Signed)
Reports feeling light headed.  On set yesterday morning around 9:30 a.m.   Mild nausea.   Denies fever, vomiting, and diarrhea.    Pt has tried resting with mild relief in symptoms.

## 2015-03-06 ENCOUNTER — Other Ambulatory Visit: Payer: Self-pay | Admitting: Thoracic Surgery (Cardiothoracic Vascular Surgery)

## 2015-03-06 DIAGNOSIS — R918 Other nonspecific abnormal finding of lung field: Secondary | ICD-10-CM

## 2015-03-18 DIAGNOSIS — Z1231 Encounter for screening mammogram for malignant neoplasm of breast: Secondary | ICD-10-CM | POA: Diagnosis not present

## 2015-03-26 ENCOUNTER — Encounter: Payer: Self-pay | Admitting: Internal Medicine

## 2015-04-04 ENCOUNTER — Encounter: Payer: Self-pay | Admitting: Internal Medicine

## 2015-04-04 ENCOUNTER — Ambulatory Visit (INDEPENDENT_AMBULATORY_CARE_PROVIDER_SITE_OTHER): Payer: Medicare Other | Admitting: Internal Medicine

## 2015-04-04 ENCOUNTER — Other Ambulatory Visit (INDEPENDENT_AMBULATORY_CARE_PROVIDER_SITE_OTHER): Payer: Medicare Other

## 2015-04-04 VITALS — BP 154/88 | HR 89 | Temp 98.7°F | Ht 61.0 in | Wt 129.0 lb

## 2015-04-04 DIAGNOSIS — Z0000 Encounter for general adult medical examination without abnormal findings: Secondary | ICD-10-CM | POA: Diagnosis not present

## 2015-04-04 DIAGNOSIS — R739 Hyperglycemia, unspecified: Secondary | ICD-10-CM | POA: Diagnosis not present

## 2015-04-04 DIAGNOSIS — I10 Essential (primary) hypertension: Secondary | ICD-10-CM

## 2015-04-04 DIAGNOSIS — E785 Hyperlipidemia, unspecified: Secondary | ICD-10-CM

## 2015-04-04 DIAGNOSIS — R350 Frequency of micturition: Secondary | ICD-10-CM | POA: Insufficient documentation

## 2015-04-04 LAB — CBC WITH DIFFERENTIAL/PLATELET
Basophils Absolute: 0 10*3/uL (ref 0.0–0.1)
Basophils Relative: 0.5 % (ref 0.0–3.0)
Eosinophils Absolute: 0.2 10*3/uL (ref 0.0–0.7)
Eosinophils Relative: 4.6 % (ref 0.0–5.0)
HCT: 43.5 % (ref 36.0–46.0)
Hemoglobin: 14.3 g/dL (ref 12.0–15.0)
Lymphocytes Relative: 31.2 % (ref 12.0–46.0)
Lymphs Abs: 1.6 10*3/uL (ref 0.7–4.0)
MCHC: 32.9 g/dL (ref 30.0–36.0)
MCV: 82.3 fl (ref 78.0–100.0)
Monocytes Absolute: 0.4 10*3/uL (ref 0.1–1.0)
Monocytes Relative: 7.4 % (ref 3.0–12.0)
Neutro Abs: 3 10*3/uL (ref 1.4–7.7)
Neutrophils Relative %: 56.3 % (ref 43.0–77.0)
Platelets: 266 10*3/uL (ref 150.0–400.0)
RBC: 5.28 Mil/uL — ABNORMAL HIGH (ref 3.87–5.11)
RDW: 19.1 % — ABNORMAL HIGH (ref 11.5–15.5)
WBC: 5.3 10*3/uL (ref 4.0–10.5)

## 2015-04-04 LAB — HEPATIC FUNCTION PANEL
ALT: 15 U/L (ref 0–35)
AST: 16 U/L (ref 0–37)
Albumin: 4 g/dL (ref 3.5–5.2)
Alkaline Phosphatase: 109 U/L (ref 39–117)
Bilirubin, Direct: 0.1 mg/dL (ref 0.0–0.3)
Total Bilirubin: 0.7 mg/dL (ref 0.2–1.2)
Total Protein: 7.3 g/dL (ref 6.0–8.3)

## 2015-04-04 LAB — URINALYSIS, ROUTINE W REFLEX MICROSCOPIC
Bilirubin Urine: NEGATIVE
Ketones, ur: NEGATIVE
Leukocytes, UA: NEGATIVE
Nitrite: NEGATIVE
Specific Gravity, Urine: 1.02 (ref 1.000–1.030)
Urine Glucose: NEGATIVE
Urobilinogen, UA: 0.2 (ref 0.0–1.0)
pH: 7 (ref 5.0–8.0)

## 2015-04-04 LAB — BASIC METABOLIC PANEL
BUN: 11 mg/dL (ref 6–23)
CO2: 27 mEq/L (ref 19–32)
Calcium: 9.6 mg/dL (ref 8.4–10.5)
Chloride: 105 mEq/L (ref 96–112)
Creatinine, Ser: 0.67 mg/dL (ref 0.40–1.20)
GFR: 111.63 mL/min (ref >60.00)
Glucose, Bld: 108 mg/dL — ABNORMAL HIGH (ref 70–99)
Potassium: 4 mEq/L (ref 3.5–5.1)
Sodium: 143 mEq/L (ref 135–145)

## 2015-04-04 LAB — LIPID PANEL
Cholesterol: 188 mg/dL (ref 0–200)
HDL: 56.2 mg/dL (ref >39.00)
LDL Cholesterol: 109 mg/dL — ABNORMAL HIGH (ref 0–99)
NonHDL: 131.6
Total CHOL/HDL Ratio: 3
Triglycerides: 111 mg/dL (ref 0.0–149.0)
VLDL: 22.2 mg/dL (ref 0.0–40.0)

## 2015-04-04 LAB — TSH: TSH: 0.03 u[IU]/mL — ABNORMAL LOW (ref 0.35–4.50)

## 2015-04-04 LAB — HEMOGLOBIN A1C: Hgb A1c MFr Bld: 6.7 % — ABNORMAL HIGH (ref 4.6–6.5)

## 2015-04-04 MED ORDER — LOSARTAN POTASSIUM 50 MG PO TABS: 50.0000 mg | ORAL_TABLET | Freq: Every day | ORAL | Status: DC

## 2015-04-04 MED ORDER — CEPHALEXIN 500 MG PO CAPS: 500.0000 mg | ORAL_CAPSULE | Freq: Four times a day (QID) | ORAL | Status: DC

## 2015-04-04 NOTE — Assessment & Plan Note (Signed)
Likely uti , for cephalexain asd, urine studies,  to f/u any worsening symptoms or concerns

## 2015-04-04 NOTE — Patient Instructions (Addendum)
Please take all new medication as prescribed - the antibiotic, and the losartan  Please continue all other medications as before, and refills have been done if requested.  Please have the pharmacy call with any other refills you may need.  Please continue your efforts at being more active, low cholesterol diet, and weight control.  You are otherwise up to date with prevention measures today.  Please keep your appointments with your specialists as you may have planned  Please go to the LAB in the Basement (turn left off the elevator) for the tests to be done today  You will be contacted by phone if any changes need to be made immediately.  Otherwise, you will receive a letter about your results with an explanation, but please check with MyChart first.  Please remember to sign up for MyChart if you have not done so, as this will be important to you in the future with finding out test results, communicating by private email, and scheduling acute appointments online when needed.  Please return in 6 months, or sooner if needed

## 2015-04-04 NOTE — Assessment & Plan Note (Signed)
Asympt, for a1c,  to f/u any worsening symptoms or concerns Lab Results  Component Value Date   HGBA1C  02/21/2009    5.9 (NOTE) The ADA recommends the following therapeutic goal for glycemic control related to Hgb A1c measurement: Goal of therapy: <6.5 Hgb A1c  Reference: American Diabetes Association: Clinical Practice Recommendations 2010, Diabetes Care, 2010, 33: (Suppl  1).

## 2015-04-04 NOTE — Progress Notes (Signed)
Pre visit review using our clinic review tool, if applicable. No additional management support is needed unless otherwise documented below in the visit note. 

## 2015-04-04 NOTE — Assessment & Plan Note (Signed)
Recent uncontrolled o/w stable overall by history and exam, recent data reviewed with pt, and pt to continue medical treatment as before except to add losartan 50 qd,  to f/u any worsening symptoms or concerns

## 2015-04-04 NOTE — Progress Notes (Signed)
Subjective:    Patient ID: [name], female    DOB: 1945/10/16, 69 y.o.   MRN: [id number]  HPI  Here for wellness and f/u;  Overall doing ok;  Pt denies Chest pain, worsening SOB, DOE, wheezing, orthopnea, PND, worsening LE edema, palpitations, dizziness or syncope.  Pt denies neurological change such as new headache, facial or extremity weakness.  Pt denies polydipsia, polyuria, or low sugar symptoms. Pt states overall good compliance with treatment and medications, good tolerability, and has been trying to follow appropriate diet.  Pt denies worsening depressive symptoms, suicidal ideation or panic. No fever, night sweats, wt loss, loss of appetite, or other constitutional symptoms.  Pt states good ability with ADL's, has low fall risk, home safety reviewed and adequate, no other significant changes in hearing or vision, and only occasionally active with exercise. Has appt in f/u with Dr Forde Dandy for thyroid in 2 mo , recently with decr synthroid to 100 from 125.  BP Readings from Last 3 Encounters:  04/04/15 154/88  01/29/15 171/95  11/02/14 158/78  BP's have been elevated recently. No recent a1c done,  Also here 1 mo wax and wane urinary freq now worse last few days with nocturia x 5, with low abd pain and nausea, but /Denies urinary symptoms such as dysuria, flank pain, hematuria or vomiting, fever, chills. Past Medical History  Diagnosis Date  . Coronary artery disease   . Hyperlipidemia   . History of rheumatic fever   . Anemia   . OSTEOPENIA   . Insomnia   . COLONIC POLYPS, HX OF   . Hypothyroidism   . Hypertension   . Gastroesophageal reflux disease   . PVD (peripheral vascular disease) 10/31/2011  . Vitamin D deficiency   . RSD (reflex sympathetic dystrophy) 10/31/2011  . PAD (peripheral artery disease) 12/03/2011    status post right SFA [place] atherectomy  . COPD (chronic obstructive pulmonary disease)     no per pt on 03/21/2014  . Osteoarthritis   . DISC DISEASE,  CERVICAL   . Arthritis     "my whole body"  . Heart murmur     as a child  . Anxiety     hx   Past Surgical History  Procedure Laterality Date  . Abdominal hysterectomy  1977    "partial"  . Thyroidectomy  1995  . Tonsillectomy    . Carpal tunnel release Right 2005  . Cataract extraction Left 2013  . Balloon angioplasty, artery Right 03/21/2014    SFA  . Dilation and curettage of uterus  1978  . Coronary artery bypass graft  10/2009    LIMA to the LAD, saphenous vein graft to the acute marginal, saphenous vein graft to the diagonal and obtuse marginal.  . Cardiac catheterization  2011   . Total knee arthroplasty Right 06/18/2014    Procedure: RIGHT TOTAL KNEE ARTHROPLASTY;  Surgeon: Tobi Bastos, MD;  Location: [facility];  Service: Orthopedics;  Laterality: Right;  . Lower extremity angiogram N/A 03/21/2014    Procedure: LOWER EXTREMITY ANGIOGRAM;  Surgeon: Lorretta Harp, MD;  Location: [facility] CATH LAB;  Service: Cardiovascular;  Laterality: N/A;    reports that she quit smoking about 5 years ago. Her smoking use included Cigarettes. She has a 36 pack-year smoking history. She has never used smokeless tobacco. She reports that she does not drink alcohol or use illicit drugs. family history includes Arthritis in her maternal aunt; Diabetes in her maternal aunt; Heart disease  in her maternal aunt; Hypertension in her maternal aunt. There is no history of Colon cancer, Kidney disease, Liver disease, Throat cancer, or Stomach cancer. Allergies  Allergen Reactions  . Lipitor [Atorvastatin] Other (See Comments)    Muscle aches   Current Outpatient Prescriptions on File Prior to Visit  Medication Sig Dispense Refill  . amLODipine (NORVASC) 10 MG tablet TAKE ONE TABLET BY MOUTH ONCE DAILY 90 tablet 3  . aspirin 325 MG tablet Take 325 mg by mouth daily.    . Aspirin-Salicylamide-Caffeine (BC HEADACHE POWDER PO) Take 1 packet by mouth 3 (three) times daily as needed (headache).    . Calcium  Carb-Cholecalciferol (CALCIUM 1000 + D PO) Take 1 tablet by mouth daily at 2 PM daily at 2 PM.     . docusate sodium 100 MG CAPS Take 100 mg by mouth 2 (two) times daily. 20 capsule 0  . hydrochlorothiazide (HYDRODIURIL) 25 MG tablet Take 25 mg by mouth daily after lunch.     . levothyroxine (SYNTHROID, LEVOTHROID) 100 MCG tablet Take 1 tablet (100 mcg total) by mouth daily before breakfast. 30 tablet 3  . meclizine (ANTIVERT) 25 MG tablet Take 1 tablet (25 mg total) by mouth 3 (three) times daily as needed for dizziness. 30 tablet 0  . methocarbamol (ROBAXIN) 500 MG tablet Take 1 tablet (500 mg total) by mouth every 6 (six) hours as needed for muscle spasms. 40 tablet 1  . Multiple Vitamin (MULTIVITAMIN WITH MINERALS) TABS tablet Take 1 tablet by mouth every morning. Centrum    . PRENATAL VITAMINS PO Take 1 capsule by mouth every morning.     Marland Kitchen tiZANidine (ZANAFLEX) 4 MG tablet Take 1 tablet (4 mg total) by mouth every 6 (six) hours as needed for muscle spasms. 40 tablet 1  . traMADol (ULTRAM) 50 MG tablet Take by mouth every 6 (six) hours as needed.     Current Facility-Administered Medications on File Prior to Visit  Medication Dose Route Frequency Provider Last Rate Last Dose  . tranexamic acid (CYKLOKAPRON) 2,000 mg in sodium chloride 0.9 % 50 mL Topical Application  1,856 mg Topical Once [employer], PA-C        Review of Systems Constitutional: Negative for increased diaphoresis, other activity, appetite or siginficant weight change other than noted HENT: Negative for worsening hearing loss, ear pain, facial swelling, mouth sores and neck stiffness.   Eyes: Negative for other worsening pain, redness or visual disturbance.  Respiratory: Negative for shortness of breath and wheezing  Cardiovascular: Negative for chest pain and palpitations.  Gastrointestinal: Negative for diarrhea, blood in stool, abdominal distention or other pain Genitourinary: Negative for hematuria, flank pain or  change in urine volume.  Musculoskeletal: Negative for myalgias or other joint complaints.  Skin: Negative for color change and wound or drainage.  Neurological: Negative for syncope and numbness. other than noted Hematological: Negative for adenopathy. or other swelling Psychiatric/Behavioral: Negative for hallucinations, SI, self-injury, decreased concentration or other worsening agitation.      Objective:   Physical Exam BP 154/88 mmHg  Pulse 89  Temp(Src) 98.7 F (37.1 C) (Oral)  Ht '5\' 1"'$  (1.549 m)  Wt 129 lb (58.514 kg)  BMI 24.39 kg/m2  SpO2 96% VS noted,  Constitutional: Pt is oriented to person, place, and time. Appears well-developed and well-nourished, in no significant distress Head: Normocephalic and atraumatic.  Right Ear: External ear normal.  Left Ear: External ear normal.  Nose: Nose normal.  Mouth/Throat: Oropharynx is clear and moist.  Eyes: Conjunctivae and EOM are normal. Pupils are equal, round, and reactive to light.  Neck: Normal range of motion. Neck supple. No JVD present. No tracheal deviation present or significant neck LA or mass Cardiovascular: Normal rate, regular rhythm, normal heart sounds and intact distal pulses.   Pulmonary/Chest: Effort normal and breath sounds without rales or wheezing  Abdominal: Soft. Bowel sounds are normal. NT except for low mid abd, no guarding or rebound,. No HSM  Musculoskeletal: Normal range of motion. Exhibits no edema.  Lymphadenopathy:  Has no cervical adenopathy.  Neurological: Pt is alert and oriented to person, place, and time. Pt has normal reflexes. No cranial nerve deficit. Motor grossly intact Skin: Skin is warm and dry. No rash noted.  Psychiatric:  Has normal mood and affect. Behavior is normal.      Assessment & Plan:

## 2015-04-04 NOTE — Assessment & Plan Note (Signed)

## 2015-04-05 LAB — URINE CULTURE: Colony Count: 45000

## 2015-04-08 ENCOUNTER — Other Ambulatory Visit: Payer: Medicare Other

## 2015-04-08 ENCOUNTER — Ambulatory Visit: Payer: Medicare Other | Admitting: Thoracic Surgery (Cardiothoracic Vascular Surgery)

## 2015-04-29 ENCOUNTER — Encounter: Payer: Self-pay | Admitting: Thoracic Surgery (Cardiothoracic Vascular Surgery)

## 2015-04-29 ENCOUNTER — Ambulatory Visit
Admission: RE | Admit: 2015-04-29 | Discharge: 2015-04-29 | Disposition: A | Discharge status: Home / Self Care | Payer: Medicare Other | Source: Ambulatory Visit | Attending: Thoracic Surgery (Cardiothoracic Vascular Surgery) | Admitting: Thoracic Surgery (Cardiothoracic Vascular Surgery)

## 2015-04-29 ENCOUNTER — Ambulatory Visit (INDEPENDENT_AMBULATORY_CARE_PROVIDER_SITE_OTHER): Payer: Medicare Other | Admitting: Thoracic Surgery (Cardiothoracic Vascular Surgery)

## 2015-04-29 ENCOUNTER — Other Ambulatory Visit: Payer: Medicare Other

## 2015-04-29 VITALS — BP 167/85 | HR 72 | Resp 20 | Ht 61.0 in | Wt 129.0 lb

## 2015-04-29 DIAGNOSIS — J439 Emphysema, unspecified: Secondary | ICD-10-CM | POA: Diagnosis not present

## 2015-04-29 DIAGNOSIS — R918 Other nonspecific abnormal finding of lung field: Secondary | ICD-10-CM

## 2015-04-29 NOTE — Progress Notes (Signed)
[address]       , [id number]             [phone]       HPI: Ms. Serpas returns today for a scheduled 6 month follow-up visit. She was last seen in the office in February 2016.  She is a 69 year old woman with a 45-pack-year history of tobacco abuse. She quit smoking after her coronary bypass surgery in 2011.   In 2015 she was being evaluated for left arm pain and numbness. A scan of her neck showed cervical spine disease. A questionable right apical mass was noted also. A CT of the chest showed bilateral apical parenchymal densities. The one on the right had enlarged since her last scan. She had a PET/CT, which showed no convincing hypermetabolism relative to the opposite side. It was felt to likely be benign, but I recommended that we follow to be certain.  She says she feels well. Dr. Jenny Reichmann recently started her on losartan in addition to her previous blood pressure medications. She gets short of breath with heavy exertion. She's not had any anginal-type chest pain. She does have frequent belching and heartburn. That is a long-standing issue. She denies weight loss. She has an occasional cough, no hemoptysis. She does complain of some mild swelling in her right leg. 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  Past Medical History  Diagnosis Date  . Coronary artery disease   . Hyperlipidemia   . History of rheumatic fever   . Anemia   . OSTEOPENIA   . Insomnia   . COLONIC POLYPS, HX OF   . Hypothyroidism   . Hypertension   . Gastroesophageal reflux disease   . PVD (peripheral vascular disease) 10/31/2011  . Vitamin D deficiency   . RSD (reflex sympathetic dystrophy) 10/31/2011  . PAD (peripheral artery disease) 12/03/2011    status post right SFA Turbo Hawk atherectomy  . COPD (chronic obstructive pulmonary disease)     no per pt on 03/21/2014  . Osteoarthritis   . DISC DISEASE, CERVICAL   .  Arthritis     "my whole body"  . Heart murmur     as a child  . Anxiety     hx   Past Surgical History  Procedure Laterality Date  . Abdominal hysterectomy  1977    "partial"  . Thyroidectomy  1995  . Tonsillectomy    . Carpal tunnel release Right 2005  . Cataract extraction Left 2013  . Balloon angioplasty, artery Right 03/21/2014    SFA  . Dilation and curettage of uterus  1978  . Coronary artery bypass graft  10/2009    LIMA to the LAD, saphenous vein graft to the acute marginal, saphenous vein graft to the diagonal and obtuse marginal.  . Cardiac catheterization  2011   . Total knee arthroplasty Right 06/18/2014    Procedure: RIGHT TOTAL KNEE ARTHROPLASTY;  Surgeon: Tobi Bastos, MD;  Location: [facility];  Service: Orthopedics;  Laterality: Right;  . Lower extremity angiogram N/A 03/21/2014    Procedure: LOWER EXTREMITY ANGIOGRAM;  Surgeon: Lorretta Harp, MD;  Location: [facility] CATH LAB;  Service: Cardiovascular;  Laterality: N/A;     Current Outpatient Prescriptions  Medication Sig Dispense Refill  . amLODipine (NORVASC) 10 MG tablet TAKE ONE TABLET BY MOUTH ONCE DAILY 90 tablet 3  . aspirin 325 MG tablet Take 325  mg by mouth daily.    . Aspirin-Salicylamide-Caffeine (BC HEADACHE POWDER PO) Take 1 packet by mouth 3 (three) times daily as needed (headache).    . Calcium Carb-Cholecalciferol (CALCIUM 1000 + D PO) Take 1 tablet by mouth daily at 2 PM daily at 2 PM.     . cephALEXin (KEFLEX) 500 MG capsule Take 1 capsule (500 mg total) by mouth 4 (four) times daily. 40 capsule 0  . docusate sodium 100 MG CAPS Take 100 mg by mouth 2 (two) times daily. 20 capsule 0  . hydrochlorothiazide (HYDRODIURIL) 25 MG tablet Take 25 mg by mouth daily after lunch.     . levothyroxine (SYNTHROID, LEVOTHROID) 100 MCG tablet Take 1 tablet (100 mcg total) by mouth daily before breakfast. 30 tablet 3  . losartan (COZAAR) 50 MG tablet Take 1 tablet (50 mg total) by mouth daily. 90 tablet 3  .  meclizine (ANTIVERT) 25 MG tablet Take 1 tablet (25 mg total) by mouth 3 (three) times daily as needed for dizziness. 30 tablet 0  . methocarbamol (ROBAXIN) 500 MG tablet Take 1 tablet (500 mg total) by mouth every 6 (six) hours as needed for muscle spasms. 40 tablet 1  . Multiple Vitamin (MULTIVITAMIN WITH MINERALS) TABS tablet Take 1 tablet by mouth every morning. Centrum    . PRENATAL VITAMINS PO Take 1 capsule by mouth every morning.     Marland Kitchen tiZANidine (ZANAFLEX) 4 MG tablet Take 1 tablet (4 mg total) by mouth every 6 (six) hours as needed for muscle spasms. 40 tablet 1  . traMADol (ULTRAM) 50 MG tablet Take by mouth every 6 (six) hours as needed.     No current facility-administered medications for this visit.   Facility-Administered Medications Ordered in Other Visits  Medication Dose Route Frequency Provider Last Rate Last Dose  . tranexamic acid (CYKLOKAPRON) 2,000 mg in sodium chloride 0.9 % 50 mL Topical Application  3,151 mg Topical Once [employer], PA-C        Physical Exam: BP 167/85 mmHg  Pulse 72  Resp 20  Ht '5\' 1"'$  (1.549 m)  Wt 129 lb (58.514 kg)  BMI 24.39 kg/m2  SpO3 37% 69 year old woman in no acute distress Well-developed well-nourished Alert and oriented 3 with no focal neurologic deficits Cardiac regular rate and rhythm normal S1 and S2 Lungs clear bilaterally No cervical or supraclavicular adenopathy  Diagnostic Tests: CT CHEST WITHOUT CONTRAST  TECHNIQUE: Multidetector CT imaging of the chest was performed following the standard protocol without IV contrast.  COMPARISON: Multiple exams, including 09/24/2014 and 11/05/2009  FINDINGS: Mediastinum/Nodes: Thyroidectomy. Coronary, aortic arch, and branch vessel atherosclerotic vascular disease.  Lungs/Pleura: Biapical pleural parenchymal scarring. Severe centrilobular emphysema, progressive from 2011.  The vast majority of the small pulmonary nodules are unchanged from 2011. On image 34 of  series 6 there is a 4 mm nodule which I do not see back on 11/05/2009, but which is stable 01/29/2014.  Upper abdomen: Vascular calcifications in the upper abdomen. Hypodense oval-shaped lesion in the right hepatic lobe, approximately 1.6 by 0.9 cm, stable. Suspected left nephrolithiasis.  Musculoskeletal: 5 mm anterolisthesis at C7-T1, stable.  IMPRESSION: 1. There is a 4 mm nodule in the left lower lobe which is stable from 01/29/2014, but I do not see it on earlier studies. Accordingly we of documented 15 months of stability. If the patient does not have a history of malignancy, then Fleischner guidelines apply in this nodule can be safely assumed benign. If the patient does have  a history of malignancy, then Fleischner guidelines do not apply, and some form of follow up may be warranted, although the stability of the lesion over the last 15 months is reassuring. 2. The other nodules, mostly in the right lung, overall stable through 2011 and considered benign. 3. Suspected left nephrolithiasis. 4. Stable 5 mm of anterolisthesis at C7-T 1. 5. Coronary, aortic arch, and branch vessel atherosclerotic vascular disease. 6. Severe centrilobular emphysema.   Electronically Signed  By: Van Clines M.D.  On: 04/29/2015 14:25  I personally reviewed the CT scan and concur with the findings as noted above  Impression: 69 year old woman with multiple lung nodules. The index nodule that we were following initially was a right apical opacity. This area was not hypermetabolic on PET so we have continued with radiographic follow-up. We have been following that for 15 months now. There is no significant change although in one cut there is a question that might be slightly larger, this could be just due to a slight difference in the level of inspiration. None of the other nodules appear suspicious.  I recommended to her that we repeat her CT scan in 6 months. After that if  everything is stable she meets criteria for low dose CT screening. I will discuss that with her at the next visit.  Her blood pressure was elevated today at 167/85. She recently was started on losartan in addition to amlodipine and hydrochlorothiazide. I recommended that she follow up with Dr. Jenny Reichmann regarding that issue.  Plan: Follow-up with Dr. Jenny Reichmann regarding blood pressure  I will see her back in 6 months with a CT of the chest to check on the status of the right apical nodule.   Melrose Nakayama, MD [facility] [phone]

## 2015-05-01 ENCOUNTER — Telehealth: Payer: Self-pay | Admitting: Cardiovascular Disease

## 2015-05-01 DIAGNOSIS — E890 Postprocedural hypothyroidism: Secondary | ICD-10-CM | POA: Diagnosis not present

## 2015-05-01 DIAGNOSIS — Z23 Encounter for immunization: Secondary | ICD-10-CM | POA: Diagnosis not present

## 2015-05-01 DIAGNOSIS — D500 Iron deficiency anemia secondary to blood loss (chronic): Secondary | ICD-10-CM | POA: Diagnosis not present

## 2015-05-01 DIAGNOSIS — Z6823 Body mass index (BMI) 23.0-23.9, adult: Secondary | ICD-10-CM | POA: Diagnosis not present

## 2015-05-01 DIAGNOSIS — E1152 Type 2 diabetes mellitus with diabetic peripheral angiopathy with gangrene: Secondary | ICD-10-CM | POA: Diagnosis not present

## 2015-05-01 DIAGNOSIS — R918 Other nonspecific abnormal finding of lung field: Secondary | ICD-10-CM | POA: Diagnosis not present

## 2015-05-01 DIAGNOSIS — Z1389 Encounter for screening for other disorder: Secondary | ICD-10-CM | POA: Diagnosis not present

## 2015-05-01 NOTE — Telephone Encounter (Signed)
Pt called in stating that she received a call from our office last week to set up a doppler but I do not see where any one from our officed nor do I see and order. Please someone f/u with this pt ?  Thanks

## 2015-05-06 ENCOUNTER — Other Ambulatory Visit: Payer: Self-pay | Admitting: Cardiovascular Disease

## 2015-05-06 DIAGNOSIS — I739 Peripheral vascular disease, unspecified: Secondary | ICD-10-CM

## 2015-05-09 ENCOUNTER — Ambulatory Visit (HOSPITAL_COMMUNITY)
Admission: RE | Admit: 2015-05-09 | Discharge: 2015-05-09 | Disposition: A | Discharge status: Home / Self Care | Payer: Medicare Other | Source: Ambulatory Visit | Attending: Cardiovascular Disease | Admitting: Cardiovascular Disease

## 2015-05-09 DIAGNOSIS — I739 Peripheral vascular disease, unspecified: Secondary | ICD-10-CM | POA: Insufficient documentation

## 2015-06-16 ENCOUNTER — Telehealth: Payer: Self-pay

## 2015-06-16 NOTE — Telephone Encounter (Signed)
Call rec'd from this patient and discussed AWV and what it is about. The patient stated she is still having some swelling in her right knee, where she had knee surgery x 69 yo. Does not know why the swelling did not go down. Has apt with Dr. Darnell Level" the surgeon on Sat. Recommended she write her questions down so she can fup on recommended treatment plan. wll call back next week to discuss AWV.

## 2015-06-16 NOTE — Telephone Encounter (Signed)
The patient called back to schedule AWV and left another message for the patient, explaining the AWV and directed to call the schedulers to schedule or to call me back for more questions

## 2015-06-16 NOTE — Telephone Encounter (Signed)
Patient called to educate on Medicare Wellness apt. LVM for the patient to call back to educate and schedule for wellness visit; [phone]

## 2015-06-19 NOTE — Telephone Encounter (Signed)
Call back to fup on AWV; The patient agreed to come in Nov 3rd; Thursday at 8am

## 2015-06-20 ENCOUNTER — Encounter (HOSPITAL_COMMUNITY): Payer: Self-pay

## 2015-06-20 ENCOUNTER — Other Ambulatory Visit: Payer: Self-pay | Admitting: Cardiology

## 2015-06-20 ENCOUNTER — Emergency Department (HOSPITAL_COMMUNITY)
Admission: EM | Admit: 2015-06-20 | Discharge: 2015-06-20 | Disposition: A | Discharge status: Home / Self Care | Payer: Medicare Other | Attending: Emergency Medicine | Admitting: Emergency Medicine

## 2015-06-20 ENCOUNTER — Emergency Department (HOSPITAL_COMMUNITY): Payer: Medicare Other

## 2015-06-20 DIAGNOSIS — J449 Chronic obstructive pulmonary disease, unspecified: Secondary | ICD-10-CM | POA: Diagnosis not present

## 2015-06-20 DIAGNOSIS — I739 Peripheral vascular disease, unspecified: Secondary | ICD-10-CM | POA: Diagnosis not present

## 2015-06-20 DIAGNOSIS — Z86010 Personal history of colonic polyps: Secondary | ICD-10-CM | POA: Diagnosis not present

## 2015-06-20 DIAGNOSIS — I10 Essential (primary) hypertension: Secondary | ICD-10-CM | POA: Diagnosis not present

## 2015-06-20 DIAGNOSIS — M1990 Unspecified osteoarthritis, unspecified site: Secondary | ICD-10-CM | POA: Diagnosis not present

## 2015-06-20 DIAGNOSIS — E039 Hypothyroidism, unspecified: Secondary | ICD-10-CM | POA: Insufficient documentation

## 2015-06-20 DIAGNOSIS — R9431 Abnormal electrocardiogram [ECG] [EKG]: Secondary | ICD-10-CM | POA: Diagnosis not present

## 2015-06-20 DIAGNOSIS — Z79899 Other long term (current) drug therapy: Secondary | ICD-10-CM | POA: Diagnosis not present

## 2015-06-20 DIAGNOSIS — Z862 Personal history of diseases of the blood and blood-forming organs and certain disorders involving the immune mechanism: Secondary | ICD-10-CM | POA: Diagnosis not present

## 2015-06-20 DIAGNOSIS — Z951 Presence of aortocoronary bypass graft: Secondary | ICD-10-CM | POA: Insufficient documentation

## 2015-06-20 DIAGNOSIS — R079 Chest pain, unspecified: Secondary | ICD-10-CM | POA: Diagnosis not present

## 2015-06-20 DIAGNOSIS — Z8669 Personal history of other diseases of the nervous system and sense organs: Secondary | ICD-10-CM | POA: Diagnosis not present

## 2015-06-20 DIAGNOSIS — E559 Vitamin D deficiency, unspecified: Secondary | ICD-10-CM | POA: Insufficient documentation

## 2015-06-20 DIAGNOSIS — K219 Gastro-esophageal reflux disease without esophagitis: Secondary | ICD-10-CM | POA: Insufficient documentation

## 2015-06-20 DIAGNOSIS — E785 Hyperlipidemia, unspecified: Secondary | ICD-10-CM | POA: Diagnosis not present

## 2015-06-20 DIAGNOSIS — Z7982 Long term (current) use of aspirin: Secondary | ICD-10-CM | POA: Insufficient documentation

## 2015-06-20 DIAGNOSIS — R0789 Other chest pain: Secondary | ICD-10-CM | POA: Insufficient documentation

## 2015-06-20 DIAGNOSIS — Z87891 Personal history of nicotine dependence: Secondary | ICD-10-CM | POA: Diagnosis not present

## 2015-06-20 DIAGNOSIS — I2510 Atherosclerotic heart disease of native coronary artery without angina pectoris: Secondary | ICD-10-CM | POA: Diagnosis not present

## 2015-06-20 DIAGNOSIS — R011 Cardiac murmur, unspecified: Secondary | ICD-10-CM | POA: Diagnosis not present

## 2015-06-20 DIAGNOSIS — Z9889 Other specified postprocedural states: Secondary | ICD-10-CM | POA: Diagnosis not present

## 2015-06-20 DIAGNOSIS — I25810 Atherosclerosis of coronary artery bypass graft(s) without angina pectoris: Secondary | ICD-10-CM

## 2015-06-20 DIAGNOSIS — Z8659 Personal history of other mental and behavioral disorders: Secondary | ICD-10-CM | POA: Insufficient documentation

## 2015-06-20 DIAGNOSIS — M8580 Other specified disorders of bone density and structure, unspecified site: Secondary | ICD-10-CM | POA: Insufficient documentation

## 2015-06-20 LAB — URINALYSIS, ROUTINE W REFLEX MICROSCOPIC
Bilirubin Urine: NEGATIVE
Glucose, UA: NEGATIVE mg/dL
Ketones, ur: NEGATIVE mg/dL
Leukocytes, UA: NEGATIVE
Nitrite: NEGATIVE
Protein, ur: 30 mg/dL — AB
Specific Gravity, Urine: 1.015 (ref 1.005–1.030)
Urobilinogen, UA: 0.2 mg/dL (ref 0.0–1.0)
pH: 7 (ref 5.0–8.0)

## 2015-06-20 LAB — COMPREHENSIVE METABOLIC PANEL
ALT: 17 U/L (ref 14–54)
AST: 22 U/L (ref 15–41)
Albumin: 3.4 g/dL — ABNORMAL LOW (ref 3.5–5.0)
Alkaline Phosphatase: 107 U/L (ref 38–126)
Anion gap: 13 (ref 5–15)
BUN: 8 mg/dL (ref 6–20)
CO2: 24 mmol/L (ref 22–32)
Calcium: 9.2 mg/dL (ref 8.9–10.3)
Chloride: 106 mmol/L (ref 101–111)
Creatinine, Ser: 0.62 mg/dL (ref 0.44–1.00)
GFR calc Af Amer: >60 mL/min (ref >60)
GFR calc non Af Amer: >60 mL/min (ref >60)
Glucose, Bld: 114 mg/dL — ABNORMAL HIGH (ref 65–99)
Potassium: 3.4 mmol/L — ABNORMAL LOW (ref 3.5–5.1)
Sodium: 143 mmol/L (ref 135–145)
Total Bilirubin: 0.5 mg/dL (ref 0.3–1.2)
Total Protein: 6.2 g/dL — ABNORMAL LOW (ref 6.5–8.1)

## 2015-06-20 LAB — URINE MICROSCOPIC-ADD ON

## 2015-06-20 LAB — I-STAT TROPONIN, ED: Troponin i, poc: 0 ng/mL (ref 0.00–0.08)

## 2015-06-20 LAB — CBC WITH DIFFERENTIAL/PLATELET
Basophils Absolute: 0 10*3/uL (ref 0.0–0.1)
Basophils Relative: 1 %
Eosinophils Absolute: 0.3 10*3/uL (ref 0.0–0.7)
Eosinophils Relative: 4 %
HCT: 45.2 % (ref 36.0–46.0)
Hemoglobin: 15 g/dL (ref 12.0–15.0)
Lymphocytes Relative: 33 %
Lymphs Abs: 1.9 10*3/uL (ref 0.7–4.0)
MCH: 29 pg (ref 26.0–34.0)
MCHC: 33.2 g/dL (ref 30.0–36.0)
MCV: 87.4 fL (ref 78.0–100.0)
Monocytes Absolute: 0.5 10*3/uL (ref 0.1–1.0)
Monocytes Relative: 8 %
Neutro Abs: 3.1 10*3/uL (ref 1.7–7.7)
Neutrophils Relative %: 54 %
Platelets: 241 10*3/uL (ref 150–400)
RBC: 5.17 MIL/uL — ABNORMAL HIGH (ref 3.87–5.11)
RDW: 14.8 % (ref 11.5–15.5)
WBC: 5.7 10*3/uL (ref 4.0–10.5)

## 2015-06-20 LAB — LIPASE, BLOOD: Lipase: 20 U/L (ref 11–51)

## 2015-06-20 MED ORDER — PANTOPRAZOLE SODIUM 40 MG IV SOLR
40.0000 mg | Freq: Once | INTRAVENOUS | Status: AC
  Administered 2015-06-20: 40 mg via INTRAVENOUS
  Filled 2015-06-20: qty 40

## 2015-06-20 MED ORDER — PANTOPRAZOLE SODIUM 40 MG PO TBEC
40.0000 mg | DELAYED_RELEASE_TABLET | Freq: Two times a day (BID) | ORAL | Status: DC
Start: 2015-06-20 — End: 2015-06-23

## 2015-06-20 MED ORDER — ASPIRIN 81 MG PO CHEW: 324.0000 mg | CHEWABLE_TABLET | Freq: Once | ORAL | Status: DC

## 2015-06-20 NOTE — Discharge Instructions (Signed)
1. Medications: Protonix (called to your pharmacy), usual home medications 2. Treatment: rest, drink plenty of fluids,  3. Follow Up: Please followup with your doctor as directed by Dr. Martinique    Food Choices for Gastroesophageal Reflux Disease, Adult When you have gastroesophageal reflux disease (GERD), the foods you eat and your eating habits are very important. Choosing the right foods can help ease your discomfort.  WHAT GUIDELINES DO I NEED TO FOLLOW?   Choose fruits, vegetables, whole grains, and low-fat dairy products.   Choose low-fat meat, fish, and poultry.  Limit fats such as oils, salad dressings, butter, nuts, and avocado.   Keep a food diary. This helps you identify foods that cause symptoms.   Avoid foods that cause symptoms. These may be different for everyone.   Eat small meals often instead of 3 large meals a day.   Eat your meals slowly, in a place where you are relaxed.   Limit fried foods.   Cook foods using methods other than frying.   Avoid drinking alcohol.   Avoid drinking large amounts of liquids with your meals.   Avoid bending over or lying down until 2-3 hours after eating.  WHAT FOODS ARE NOT RECOMMENDED?  These are some foods and drinks that may make your symptoms worse: Vegetables Tomatoes. Tomato juice. Tomato and spaghetti sauce. Chili peppers. Onion and garlic. Horseradish. Fruits Oranges, grapefruit, and lemon (fruit and juice). Meats High-fat meats, fish, and poultry. This includes hot dogs, ribs, ham, sausage, salami, and bacon. Dairy Whole milk and chocolate milk. Sour cream. Cream. Butter. Ice cream. Cream cheese.  Drinks Coffee and tea. Bubbly (carbonated) drinks or energy drinks. Condiments Hot sauce. Barbecue sauce.  Sweets/Desserts Chocolate and cocoa. Donuts. Peppermint and spearmint. Fats and Oils High-fat foods. This includes [place] fries and potato chips. Other Vinegar. Strong spices. This includes black  pepper, white pepper, red pepper, cayenne, curry powder, cloves, ginger, and chili powder. The items listed above may not be a complete list of foods and drinks to avoid. Contact your dietitian for more information.   This information is not intended to replace advice given to you by your health care provider. Make sure you discuss any questions you have with your health care provider.   Document Released: 02/08/2012 Document Revised: 08/30/2014 Document Reviewed: 06/13/2013 Elsevier Interactive Patient Education [employer].

## 2015-06-20 NOTE — ED Notes (Signed)
Pt took 325asa this morning at her house

## 2015-06-20 NOTE — ED Notes (Signed)
Cards MD at bedside

## 2015-06-20 NOTE — ED Notes (Signed)
Pt states she has been experiencing GERD for the past 3 weeks;  Pt c/o of burning sensation; Pt states hx of bypass so she came for due to concern; pt states she had Knee replacement and noticed some swelling in mid leg area but not in ankles; Pt states burning sensation woke her up in the middle of the night with nausea;

## 2015-06-20 NOTE — ED Provider Notes (Signed)
CSN: [id number]     Arrival date & time 06/20/15  0555 History   First MD Initiated Contact with Patient 06/20/15 0601     Chief Complaint  Patient presents with  . Gastroesophageal Reflux     (Consider location/radiation/quality/duration/timing/severity/associated sxs/prior Treatment) The history is provided by the patient and medical records. No language interpreter was used.     [name] is a 69 y.o. female  with a hx of coronary artery disease with CABG in 2011, hyperlipidemia, anemia, hypertension, GERD, peripheral vascular disease, peripheral arterial disease, COPD, osteoarthritis presents to the Emergency Department complaining of gradual, persistent,  waxing and waning epigastric discomfort and burning in her chest beginning approximately 3 weeks ago. Patient reports that symptoms became worse last night sometime before midnight and kept her up intermittently throughout the night.  Patient reports associated nausea but she denies diaphoresis, near syncope, chest pain, vomiting.  She reports some right leg swelling, present and unchanged since her knee replacement approximately one year ago. She denies swelling to the feet or ankles. She denies travel or immobilization, estrogen usage, history of DVT.  Patient reports drinking cold ice water makes symptoms better for short. Of time, peppermint makes it worse. She is not taking any reflux medication at this time. Patient reports treatment of her symptoms for the last 3 weeks with over-the-counter reflux medications which improve her symptoms for short periods of time and then they return. Patient reports her symptoms are worse at night. Pt took ASA at home PTA.    Cardiology: Elder Negus   Past Medical History  Diagnosis Date  . Coronary artery disease   . Hyperlipidemia   . History of rheumatic fever   . Anemia   . OSTEOPENIA   . Insomnia   . COLONIC POLYPS, HX OF   . Hypothyroidism   . Hypertension   .  Gastroesophageal reflux disease   . PVD (peripheral vascular disease) ([place]) 10/31/2011  . Vitamin D deficiency   . RSD (reflex sympathetic dystrophy) 10/31/2011  . PAD (peripheral artery disease) ([place]) 12/03/2011    status post right SFA [place]bo Hawk atherectomy  . COPD (chronic obstructive pulmonary disease) ([place])     no per pt on 03/21/2014  . Osteoarthritis   . DISC DISEASE, CERVICAL   . Arthritis     "my whole body"  . Heart murmur     as a child  . Anxiety     hx   Past Surgical History  Procedure Laterality Date  . Abdominal hysterectomy  1977    "partial"  . Thyroidectomy  1995  . Tonsillectomy    . Carpal tunnel release Right 2005  . Cataract extraction Left 2013  . Balloon angioplasty, artery Right 03/21/2014    SFA  . Dilation and curettage of uterus  1978  . Coronary artery bypass graft  10/2009    LIMA to the LAD, saphenous vein graft to the acute marginal, saphenous vein graft to the diagonal and obtuse marginal.  . Cardiac catheterization  2011   . Total knee arthroplasty Right 06/18/2014    Procedure: RIGHT TOTAL KNEE ARTHROPLASTY;  Surgeon: Tobi Bastos, MD;  Location: [facility];  Service: Orthopedics;  Laterality: Right;  . Lower extremity angiogram N/A 03/21/2014    Procedure: LOWER EXTREMITY ANGIOGRAM;  Surgeon: Lorretta Harp, MD;  Location: [facility] CATH LAB;  Service: Cardiovascular;  Laterality: N/A;   Family History  Problem Relation Age of Onset  . Arthritis Maternal Aunt   .  Heart disease Maternal Aunt   . Hypertension Maternal Aunt   . Diabetes Maternal Aunt   . Colon cancer Neg Hx   . Kidney disease Neg Hx   . Liver disease Neg Hx   . Throat cancer Neg Hx   . Stomach cancer Neg Hx    Social History  Substance Use Topics  . Smoking status: Former Smoker -- 0.80 packs/day for 45 years    Types: Cigarettes    Quit date: 08/23/2009  . Smokeless tobacco: Never Used  . Alcohol Use: No   OB History    No data available     Review of Systems   Constitutional: Negative for fever, diaphoresis, appetite change, fatigue and unexpected weight change.  HENT: Negative for mouth sores.   Eyes: Negative for visual disturbance.  Respiratory: Negative for cough, chest tightness, shortness of breath and wheezing.   Cardiovascular: Positive for chest pain (burning sensation) and leg swelling (chronic).  Gastrointestinal: Negative for nausea, vomiting, abdominal pain, diarrhea and constipation.  Endocrine: Negative for polydipsia, polyphagia and polyuria.  Genitourinary: Negative for dysuria, urgency, frequency and hematuria.  Musculoskeletal: Negative for back pain and neck stiffness.  Skin: Negative for rash.  Allergic/Immunologic: Negative for immunocompromised state.  Neurological: Negative for syncope, light-headedness and headaches.  Hematological: Does not bruise/bleed easily.  Psychiatric/Behavioral: Negative for sleep disturbance. The patient is not nervous/anxious.       Allergies  Lipitor  Home Medications   Prior to Admission medications   Medication Sig Start Date End Date Taking? Authorizing Provider  amLODipine (NORVASC) 10 MG tablet TAKE ONE TABLET BY MOUTH ONCE DAILY 11/21/14  Yes Biagio Borg, MD  aspirin 325 MG tablet Take 325 mg by mouth daily.   Yes Historical Provider, MD  Aspirin-Salicylamide-Caffeine (BC HEADACHE POWDER PO) Take 1 packet by mouth 3 (three) times daily as needed (headache).   Yes Historical Provider, MD  Calcium Carb-Cholecalciferol (CALCIUM 1000 + D PO) Take 1 tablet by mouth daily at 2 PM daily at 2 PM.    Yes Historical Provider, MD  hydrochlorothiazide (HYDRODIURIL) 25 MG tablet Take 25 mg by mouth daily after lunch.  10/28/11  Yes Biagio Borg, MD  levothyroxine (SYNTHROID, LEVOTHROID) 100 MCG tablet Take 1 tablet (100 mcg total) by mouth daily before breakfast. 09/10/14  Yes Golden Circle, FNP  meclizine (ANTIVERT) 25 MG tablet Take 1 tablet (25 mg total) by mouth 3 (three) times daily as  needed for dizziness. 01/29/15  Yes Melony Overly, MD  methocarbamol (ROBAXIN) 500 MG tablet Take 1 tablet (500 mg total) by mouth every 6 (six) hours as needed for muscle spasms. 06/20/14  Yes Amber Cecilio Asper, PA-C  Multiple Vitamin (MULTIVITAMIN WITH MINERALS) TABS tablet Take 1 tablet by mouth every morning. Centrum   Yes Historical Provider, MD  PRENATAL VITAMINS PO Take 1 capsule by mouth every morning.    Yes Historical Provider, MD  tiZANidine (ZANAFLEX) 4 MG tablet Take 1 tablet (4 mg total) by mouth every 6 (six) hours as needed for muscle spasms. 10/03/14  Yes Biagio Borg, MD  pantoprazole (PROTONIX) 40 MG tablet Take 1 tablet (40 mg total) by mouth 2 (two) times daily. 06/20/15   Peter M Martinique, MD   BP 117/95 mmHg  Pulse 77  Temp(Src) 98 F (36.7 C) (Oral)  Resp 21  SpO2 95% Physical Exam  Constitutional: She appears well-developed and well-nourished. No distress.  Awake, alert, nontoxic appearance  HENT:  Head: Normocephalic and atraumatic.  Mouth/Throat: Oropharynx is clear and moist. No oropharyngeal exudate.  Eyes: Conjunctivae are normal. No scleral icterus.  Neck: Normal range of motion. Neck supple.  Cardiovascular: Normal rate, regular rhythm, normal heart sounds and intact distal pulses.   Pulmonary/Chest: Effort normal and breath sounds normal. No respiratory distress. She has no wheezes.  Equal chest expansion  Abdominal: Soft. Bowel sounds are normal. She exhibits no mass. There is no tenderness. There is no rebound and no guarding.  Soft and nontender  Musculoskeletal: Normal range of motion. She exhibits no edema.  No peripheral edema Minimal swelling of the right knee; well healed surgical incision; no pitting edema, warmth, redness No pain or decreased ROM of the right knee  Neurological: She is alert.  Speech is clear and goal oriented Moves extremities without ataxia  Skin: Skin is warm and dry. She is not diaphoretic.  Psychiatric: She has a normal mood  and affect.  Nursing note and vitals reviewed.   ED Course  Procedures (including critical care time) Labs Review Labs Reviewed  CBC WITH DIFFERENTIAL/PLATELET - Abnormal; Notable for the following:    RBC 5.17 (*)    All other components within normal limits  COMPREHENSIVE METABOLIC PANEL - Abnormal; Notable for the following:    Potassium 3.4 (*)    Glucose, Bld 114 (*)    Total Protein 6.2 (*)    Albumin 3.4 (*)    All other components within normal limits  URINALYSIS, ROUTINE W REFLEX MICROSCOPIC (NOT AT [facility]) - Abnormal; Notable for the following:    Hgb urine dipstick TRACE (*)    Protein, ur 30 (*)    All other components within normal limits  LIPASE, BLOOD  URINE MICROSCOPIC-ADD ON  I-STAT TROPOININ, ED   ED ECG REPORT   Date: 06/20/2015  Rate: 79  Rhythm: normal sinus rhythm  QRS Axis: normal  Intervals: normal  ST/T Wave abnormalities: nonspecific ST/T changes  Conduction Disutrbances:none  Narrative Interpretation: Q waves anteriorly consistent with old anteroseptal myocardial infarction. ST depression in the inferior and anterolateral leads. When compared with ECG of 01/29/2015, depression is slightly more prominent.  Old EKG Reviewed: changes noted  I have personally reviewed the EKG tracing and agree with the computerized printout as noted.  Cardiac Cath and Angioplasty 03/21/14 - Dr. Gwenlyn Found Impression: successful HAWK 1 directional atherectomy right SFA chronic total occlusion with drug eluting balloon angioplasty with an excellent angiographic result  MDM   Final diagnoses:  Chest discomfort  Gastroesophageal reflux disease, esophagitis presence not specified  Essential hypertension  Abnormal ECG   KASHAE CARSTENS presents with GERD ssx worsening before midnight last night.  Pt with significant cardiac hx.  Review shows that patient has a long-standing history of right lower extremity claudication, multiple lung nodules which are followed by  cardiothoracic surgery and history of arthrectomy and balloon angioplasty in 2015.  7:12 AM Pt evaluated by Dr. Roxanne Mins who recommends admission as she has a slightly worsening ECG with increased depression in the inferior leads.    8:24 AM Pt adamantly refuses to be admitted for rule out as this is [employer] weekend.  Will consult cardiology to evaluate in the ED.  She is chest pain free at this time after protonix administration.    9:52 AM Discussed with cardiology who will evaluate.    11:30 AM Patient evaluating by Dr. Martinique who agrees that this is likely reflux. Patient will be discharged home with Protonix. Dr. Martinique will schedule outpatient stress test  for further cardiac evaluation.  BP 117/95 mmHg  Pulse 77  Temp(Src) 98 F (36.7 C) (Oral)  Resp 21  SpO2 95%   Abigail Butts, PA-C 72/09/19 8022  David Glick, MD 17/98/10 2548

## 2015-06-20 NOTE — Consult Note (Signed)
CARDIOLOGY CONSULT NOTE     Patient ID: [name] MRN: [id number] DOB/AGE: 71-Aug-1947 71 y.o.  Admit date: 06/20/2015 Referring Physician EDP Primary Physician [name], MD Primary Cardiologist Dr. Stanford Breed Reason for Consultation chest pain.  HPI: 69 yo BF presents to the ED today for evaluation of chest pain. She reports that over the past month she has been having increased symptoms of acid reflux. Pain is worse at night. Sometimes is quite sharp. Doesn't have much pain during the day and none with exertion. No associated SOB, radiation, or diaphoresis. She has tried multiple meds including Nexium, Prevacid, Prilosec without relief. Has also tried several home remedies. Pain is exacerbated by certain foods. It is not like her prior cardiac pain. In 2012 she had an abnormal stress test and subsequent cath showed severe 3 vessel CAD. She underwent CABG by Dr. Roxan Hockey. She has apical pulmonary nodules that are felt to be benign but are followed every 6 months by CT with Dr. Roxan Hockey. In 2014 she had upper EGD by Dr. Ardis Hughs that showed a small hiatal hernia and some distal esophagitis. She is limited some by chronic knee pain s/p TKR. She has PAD followed by Dr. Gwenlyn Found with stable claudication. ABIs one month ago were improved.   Past Medical History  Diagnosis Date  . Coronary artery disease   . Hyperlipidemia   . History of rheumatic fever   . Anemia   . OSTEOPENIA   . Insomnia   . COLONIC POLYPS, HX OF   . Hypothyroidism   . Hypertension   . Gastroesophageal reflux disease   . PVD (peripheral vascular disease) ([place]) 10/31/2011  . Vitamin D deficiency   . RSD (reflex sympathetic dystrophy) 10/31/2011  . PAD (peripheral artery disease) ([place]) 12/03/2011    status post right SFA Turbo Hawk atherectomy  . COPD (chronic obstructive pulmonary disease) ([place])     no per pt on 03/21/2014  . Osteoarthritis   . DISC DISEASE, CERVICAL   . Arthritis     "my whole body"  . Heart  murmur     as a child  . Anxiety     hx    Family History  Problem Relation Age of Onset  . Arthritis Maternal Aunt   . Heart disease Maternal Aunt   . Hypertension Maternal Aunt   . Diabetes Maternal Aunt   . Colon cancer Neg Hx   . Kidney disease Neg Hx   . Liver disease Neg Hx   . Throat cancer Neg Hx   . Stomach cancer Neg Hx     Social History   Social History  . Marital Status: Single    Spouse Name: N/A  . Number of Children: 2  . Years of Education: 14   Occupational History  . Retired    Social History Main Topics  . Smoking status: Former Smoker -- 0.80 packs/day for 45 years    Types: Cigarettes    Quit date: 08/23/2009  . Smokeless tobacco: Never Used  . Alcohol Use: No  . Drug Use: No  . Sexual Activity: Not Currently   Other Topics Concern  . Not on file   Social History Narrative    Past Surgical History  Procedure Laterality Date  . Abdominal hysterectomy  1977    "partial"  . Thyroidectomy  1995  . Tonsillectomy    . Carpal tunnel release Right 2005  . Cataract extraction Left 2013  . Balloon angioplasty, artery Right 03/21/2014  SFA  . Dilation and curettage of uterus  1978  . Coronary artery bypass graft  10/2009    LIMA to the LAD, saphenous vein graft to the acute marginal, saphenous vein graft to the diagonal and obtuse marginal.  . Cardiac catheterization  2011   . Total knee arthroplasty Right 06/18/2014    Procedure: RIGHT TOTAL KNEE ARTHROPLASTY;  Surgeon: Tobi Bastos, MD;  Location: [facility];  Service: Orthopedics;  Laterality: Right;  . Lower extremity angiogram N/A 03/21/2014    Procedure: LOWER EXTREMITY ANGIOGRAM;  Surgeon: Lorretta Harp, MD;  Location: [facility] CATH LAB;  Service: Cardiovascular;  Laterality: N/A;      (Not in a hospital admission)   Medication List    ASK your doctor about these medications        amLODipine 10 MG tablet  Commonly known as:  NORVASC  TAKE ONE TABLET BY MOUTH ONCE DAILY      aspirin 325 MG tablet  Take 325 mg by mouth daily.     BC HEADACHE POWDER PO  Take 1 packet by mouth 3 (three) times daily as needed (headache).     CALCIUM 1000 + D PO  Take 1 tablet by mouth daily at 2 PM daily at 2 PM.     hydrochlorothiazide 25 MG tablet  Commonly known as:  HYDRODIURIL  Take 25 mg by mouth daily after lunch.     levothyroxine 100 MCG tablet  Commonly known as:  SYNTHROID, LEVOTHROID  Take 1 tablet (100 mcg total) by mouth daily before breakfast.     meclizine 25 MG tablet  Commonly known as:  ANTIVERT  Take 1 tablet (25 mg total) by mouth 3 (three) times daily as needed for dizziness.     methocarbamol 500 MG tablet  Commonly known as:  ROBAXIN  Take 1 tablet (500 mg total) by mouth every 6 (six) hours as needed for muscle spasms.     multivitamin with minerals Tabs tablet  Take 1 tablet by mouth every morning. Centrum     pantoprazole 40 MG tablet  Commonly known as:  PROTONIX  Take 1 tablet (40 mg total) by mouth 2 (two) times daily.     PRENATAL VITAMINS PO  Take 1 capsule by mouth every morning.     tiZANidine 4 MG tablet  Commonly known as:  ZANAFLEX  Take 1 tablet (4 mg total) by mouth every 6 (six) hours as needed for muscle spasms.         ROS: As noted in HPI. All other systems are reviewed and are negative unless otherwise mentioned.   Physical Exam: Blood pressure 117/95, pulse 77, temperature 98 F (36.7 C), temperature source Oral, resp. rate 21, SpO2 95 %. Current Weight  04/29/15 58.514 kg (129 lb)  04/04/15 58.514 kg (129 lb)  11/02/14 54.885 kg (121 lb)    GENERAL:  Well appearing HEENT:  PERRL, EOMI, sclera are clear. Oropharynx is clear. NECK:  No jugular venous distention, carotid upstroke brisk and symmetric, no bruits, no thyromegaly or adenopathy LUNGS:  Clear to auscultation bilaterally CHEST:  Unremarkable HEART:  RRR,  PMI not displaced or sustained,S1 and S2 within normal limits, no S3, no S4: no clicks, no  rubs, no murmurs ABD:  Soft, nontender. BS +, no masses or bruits. No hepatomegaly, no splenomegaly EXT:    no edema, no cyanosis no clubbing SKIN:  Warm and dry.  No rashes NEURO:  Alert and oriented x 3. Cranial nerves II  through XII intact. PSYCH:  Cognitively intact     Labs:   Lab Results  Component Value Date   WBC 5.7 06/20/2015   HGB 15.0 06/20/2015   HCT 45.2 06/20/2015   MCV 87.4 06/20/2015   PLT 241 06/20/2015    Recent Labs Lab 06/20/15 0621  NA 143  K 3.4*  CL 106  CO2 24  BUN 8  CREATININE 0.62  CALCIUM 9.2  PROT 6.2*  BILITOT 0.5  ALKPHOS 107  ALT 17  AST 22  GLUCOSE 114*   Lab Results  Component Value Date   CKTOTAL 246* 02/21/2009   CKTOTAL 267* 02/21/2009   CKTOTAL 243* 02/21/2009   CKMB 3.3 02/21/2009   CKMB 4.1* 02/21/2009   CKMB 3.3 02/21/2009   TROPONINI 0.01        NO INDICATION OF MYOCARDIAL INJURY. 02/21/2009   TROPONINI 0.01        NO INDICATION OF MYOCARDIAL INJURY. 02/21/2009   TROPONINI 0.02        NO INDICATION OF MYOCARDIAL INJURY. 02/21/2009   Lab Results  Component Value Date   CHOL 188 04/04/2015   CHOL 161 09/10/2014   CHOL 141 05/21/2014   Lab Results  Component Value Date   HDL 56.20 04/04/2015   HDL 47.60 09/10/2014   HDL 48 05/21/2014   Lab Results  Component Value Date   LDLCALC 109* 04/04/2015   LDLCALC 90 09/10/2014   LDLCALC 70 05/21/2014   Lab Results  Component Value Date   TRIG 111.0 04/04/2015   TRIG 115.0 09/10/2014   TRIG 116 05/21/2014   Lab Results  Component Value Date   CHOLHDL 3 04/04/2015   CHOLHDL 3 09/10/2014   CHOLHDL 2.9 05/21/2014   Lab Results  Component Value Date   LDLDIRECT 126.5 11/10/2012    No results found for: PROBNP Lab Results  Component Value Date   TSH 0.03* 04/04/2015   Lab Results  Component Value Date   HGBA1C 6.7* 04/04/2015    Radiology: Dg Chest 2 View  06/20/2015  CLINICAL DATA:  Acute onset of generalized chest pain. Initial encounter.  EXAM: CHEST  2 VIEW COMPARISON:  Chest radiograph performed 03/20/2015, and CT of the chest performed 04/29/2015 FINDINGS: The lungs are well-aerated. Pulmonary vascularity is at the upper limits of normal. There is no evidence of focal opacification, pleural effusion or pneumothorax. The heart is normal in size; the patient is status post median sternotomy, with evidence of prior CABG. No acute osseous abnormalities are seen. Postoperative change is noted about the thyroid bed. IMPRESSION: No acute cardiopulmonary process seen. Electronically Signed   By: Garald Balding M.D.   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 Diffuse ST depression with nonspecific pattern. Compared to June 2016 these changes are increased but similar to prior Ecgs in 2013 and 2014. I have personally reviewed and interpreted this study.   ASSESSMENT AND PLAN:  1. Atypical chest pain most consistent with GERD. Cardiac enzymes are normal. Nonspecific ST changes on Ecg. Recommend intensive PPI therapy with Protonix 40 mg bid. Will arrange for outpatient Lexiscan myoview since she has not had ischemic evaluation since CABG. OK for DC from ED 2. CAD s/p CABG in 2012. 3. PAD stable claudication 4. GERD 5. HTN, 6. Hyperlipidemia.   Signed: Peter Martinique, [place]  06/20/2015, 11:31 AM

## 2015-06-20 NOTE — ED Notes (Signed)
PA at bedside.

## 2015-06-21 ENCOUNTER — Other Ambulatory Visit: Payer: Self-pay | Admitting: Cardiology

## 2015-06-21 DIAGNOSIS — R079 Chest pain, unspecified: Secondary | ICD-10-CM

## 2015-06-21 DIAGNOSIS — R0789 Other chest pain: Principal | ICD-10-CM

## 2015-06-22 ENCOUNTER — Telehealth (HOSPITAL_BASED_OUTPATIENT_CLINIC_OR_DEPARTMENT_OTHER): Payer: Self-pay | Admitting: Emergency Medicine

## 2015-06-23 ENCOUNTER — Telehealth: Payer: Self-pay | Admitting: Cardiology

## 2015-06-23 MED ORDER — PANTOPRAZOLE SODIUM 40 MG PO TBEC: 40.0000 mg | DELAYED_RELEASE_TABLET | Freq: Two times a day (BID) | ORAL | Status: DC

## 2015-06-23 NOTE — Telephone Encounter (Signed)
Dr. Martinique consulted on pt in ED and wrote for Protonix - order was for print refill, she does not recall getting printed copy. I sent electronic refill to her preferred pharmacy - patient aware, no further concerns.

## 2015-06-23 NOTE — Telephone Encounter (Signed)
Pt called in stating that she was in the hospital over the weekend and was prescribed Pantoprazole by Dr. Martinique. She say that it has not been called in to the pharmacy yet and would like to know why. Please call back  Thanks

## 2015-06-26 ENCOUNTER — Ambulatory Visit (INDEPENDENT_AMBULATORY_CARE_PROVIDER_SITE_OTHER): Payer: Medicare Other

## 2015-06-26 VITALS — BP 140/80 | Ht 63.0 in | Wt 134.2 lb

## 2015-06-26 DIAGNOSIS — Z0000 Encounter for general adult medical examination without abnormal findings: Secondary | ICD-10-CM

## 2015-06-26 NOTE — Progress Notes (Addendum)
Subjective:   [name] is a 69 y.o. female who presents for Medicare Annual (Subsequent) preventive examination.  Review of Systems:     HRA assessment completed during visit; Alicia, [name] The Patient was informed that this wellness visit is to identify risk and educate on how to reduce risk for increase disease through lifestyle changes.   ROS deferred to CPE exam with physician  Medical issues Coronary atherosclerosis; HTN; COPD; GERD/ RSD osteopenia  A1c 6.7; 03/2015; glucose 114 BP 140/80/ check 2 times a week;  C/o of issues with stomach and will see GI doctor / see [name]  ED 10/28 having acid reflux Discharge instructions; she had acid reflux; had quad bypass; Cardiology ruled out cardiac issues and referred to GI; IV med and she has rx for pantoprazole; Taking since Tuesday this week and to early to tell if it is helping  BMI: 23.7 Diet; Eats breakfast sometimes; eats boiled egg; lunch sometimes she eats and sometimes she waits and eat later and then eats things she "doesn't need"  Generally high fat; liver; pork; Eat greens; broccoli;  Doesn't eat fruit;  Exercise; deferred due to right knee replacement and still having some swelling and pain;  Works at school 5 hours every day entails; goes up and down stairs and on her feet the majority of the time  Was going to they gym and did water aerobics   SAFETY/ one level home;  Safety reviewed for the home; including removal of clutter; clear paths through the home, eliminating clutter, railing as needed; bathroom safety and bathroom has been modified; has bars in and out of tub; can't do baths as she did, now does showers; has tub seat; community safety; smoke detectors and firearms safety reviewed Driving accidents (no)  Sun protection; not out in sun very much Stressors; 1-5; 2.5 ;doesn't feel she has that much stress  Medication review/  Fall assessment / fell on bed; sprung left foot Gait  assessment/  Mobilization and Functional losses in the last year. no Sleep patterns/ does not sleep; Look at TV and then goes to bed ; Goes to bed early; sometimes falls asleep in the chair in the evening.  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 Forde Dandy   Current Care Team reviewed and updated      Objective:     Vitals: BP 140/80 mmHg  Ht '5\' 3"'$  (1.6 m)  Wt 134 lb 4 oz (60.895 kg)  BMI 23.79 kg/m2  Tobacco History  Smoking status  . Former Smoker -- 0.80 packs/day for 45 years  . Types: Cigarettes  . Quit date: 08/23/2009  Smokeless tobacco  . Never Used     Counseling given: Not Answered   Past Medical History  Diagnosis Date  . Coronary artery disease   . Hyperlipidemia   . History of rheumatic fever   .  Anemia   . OSTEOPENIA   . Insomnia   . COLONIC POLYPS, HX OF   . Hypothyroidism   . Hypertension   . Gastroesophageal reflux disease   . PVD (peripheral vascular disease) ([place]) 10/31/2011  . Vitamin D deficiency   . RSD (reflex sympathetic dystrophy) 10/31/2011  . PAD (peripheral artery disease) ([place]) 12/03/2011    status post right SFA Turbo Hawk atherectomy  . COPD (chronic obstructive pulmonary disease) ([place])     no per pt on 03/21/2014  . Osteoarthritis   . DISC DISEASE, CERVICAL   . Arthritis     "my whole body"  . Heart  murmur     as a child  . Anxiety     hx   Past Surgical History  Procedure Laterality Date  . Abdominal hysterectomy  1977    "partial"  . Thyroidectomy  1995  . Tonsillectomy    . Carpal tunnel release Right 2005  . Cataract extraction Left 2013  . Balloon angioplasty, artery Right 03/21/2014    SFA  . Dilation and curettage of uterus  1978  . Coronary artery bypass graft  10/2009    LIMA to the LAD, saphenous vein graft to the acute marginal, saphenous vein graft to the diagonal and obtuse marginal.  . Cardiac catheterization  2011   . Total knee arthroplasty Right 06/18/2014    Procedure: RIGHT TOTAL KNEE ARTHROPLASTY;  Surgeon: Tobi Bastos, MD;  Location: [facility];  Service: Orthopedics;  Laterality: Right;  . Lower extremity angiogram N/A 03/21/2014    Procedure: LOWER EXTREMITY ANGIOGRAM;  Surgeon: Lorretta Harp, MD;  Location: [facility] CATH LAB;  Service: Cardiovascular;  Laterality: N/A;   Family History  Problem Relation Age of Onset  . Arthritis Maternal Aunt   . Heart disease Maternal Aunt   . Hypertension Maternal Aunt   . Diabetes Maternal Aunt   . Colon cancer Neg Hx   . Kidney disease Neg Hx   . Liver disease Neg Hx   . Throat cancer Neg Hx   . Stomach cancer Neg Hx    History  Sexual Activity  . Sexual Activity: Not Currently    Outpatient Encounter Prescriptions as of 06/26/2015  Medication Sig  . amLODipine (NORVASC) 10 MG tablet TAKE ONE TABLET BY MOUTH ONCE DAILY  . aspirin 325 MG tablet Take 325 mg by mouth daily.  . Aspirin-Salicylamide-Caffeine (BC HEADACHE POWDER PO) Take 1 packet by mouth 3 (three) times daily as needed (headache).  . Calcium Carb-Cholecalciferol (CALCIUM 1000 + D PO) Take 1 tablet by mouth daily at 2 PM daily at 2 PM.   . hydrochlorothiazide (HYDRODIURIL) 25 MG tablet Take 25 mg by mouth daily after lunch.   . levothyroxine (SYNTHROID, LEVOTHROID) 100 MCG tablet Take 1 tablet (100 mcg total) by mouth daily before breakfast.  .  meclizine (ANTIVERT) 25 MG tablet Take 1 tablet (25 mg total) by mouth 3 (three) times daily as needed for dizziness.  . methocarbamol (ROBAXIN) 500 MG tablet Take 1 tablet (500 mg total) by mouth every 6 (six) hours as needed for muscle spasms.  . Multiple Vitamin (MULTIVITAMIN WITH MINERALS) TABS tablet Take 1 tablet by mouth every morning. Centrum  . pantoprazole (PROTONIX) 40 MG tablet Take 1 tablet (40 mg total) by mouth 2 (two) times daily.  Marland Kitchen PRENATAL VITAMINS PO Take 1 capsule by mouth every morning.   Marland Kitchen tiZANidine (ZANAFLEX) 4 MG tablet Take 1 tablet (4 mg total) by mouth every 6 (  six) hours as needed for muscle spasms.   Facility-Administered Encounter Medications as of 06/26/2015  Medication  . tranexamic acid (CYKLOKAPRON) 2,000 mg in sodium chloride 0.9 % 50 mL Topical Application    Activities of Daily Living In your present state of health, do you have any difficulty performing the following activities: 06/26/2015  Hearing? N  Vision? N  Difficulty concentrating or making decisions? N  Walking or climbing stairs? Y  Dressing or bathing? N  Doing errands, shopping? N  Preparing Food and eating ? N  Using the Toilet? N  In the past six months, have you accidently leaked urine? N  Do you have problems with loss of bowel control? Y  Managing your Medications? N  Managing your Finances? N  Housekeeping or managing your Housekeeping? N    Patient Care Team: Biagio Borg, MD as PCP - General    Assessment:     Assessment   Patient presents for yearly preventative medicine examination. Medicare questionnaire screening were completed, i.e. Functional; fall risk; depression, memory loss and hearing. Were unremarkable  All immunizations and health maintenance protocols were reviewed with the patient and needed orders were placed. (flu shot at Dr. Forde Dandy was recorded)  Education provided for laboratory screens;  Educated on prediabetes and will fup on A1c in Feb; as well as  cut back on sugar; start pool exercises  Medication reconciliation, past medical history, social history, problem list and allergies were reviewed in detail with the patient  Goals were established with regard toexercise, and diet in compliance with medications based on the patient individualized risk;   End of life planning was discussed.and has completed will / Dtr and Son will make Health care decisions for her   Exercise Activities and Dietary recommendations    Goals    . patient     Discussed A1c was elevated; may want to have another drawn in Feb 2017;  States will try to drink sugar free koolaid or other Also; consider going back to the pool for water exercise;       Fall Risk Fall Risk  06/26/2015 04/04/2015 09/10/2014  Falls in the past year? Yes No Yes  Number falls in past yr: 1 - 1  Injury with Fall? - - Yes  Risk Factor Category  - - High Fall Risk  Follow up Education provided - -   Depression Screen PHQ 2/9 Scores 06/26/2015 04/04/2015 09/10/2014  PHQ - 2 Score 0 0 0     Cognitive Testing No flowsheet data found.  AD8 Score 0; working; pleasant and cooperative at today's assessment  Immunization History  Administered Date(s) Administered  . H1N1 09/12/2008  . Influenza Whole 07/24/2007, 05/23/2008, 06/17/2009, 06/16/2010  . Influenza, High Dose Seasonal PF 05/15/2014  . Influenza,inj,Quad PF,36+ Mos 05/11/2013  . Influenza-Unspecified 03/24/2015  . Pneumococcal Conjugate-13 08/02/2013  . Pneumococcal Polysaccharide-23 09/07/2007, 11/16/2012  . Td 09/12/2008  . Zoster 12/12/2007   Screening Tests Health Maintenance  Topic Date Due  . Hepatitis C Screening  Jan 08, 1946  . INFLUENZA VACCINE  03/24/2015  . MAMMOGRAM  03/17/2017  . COLONOSCOPY  10/09/2017  . TETANUS/TDAP  09/12/2018  . DEXA SCAN  Completed  . ZOSTAVAX  Completed  . PNA vac Low Risk Adult  Completed      Plan:    Had flu shot at Dr. Forde Dandy A1c which is the amount of sugar in your  blood for 2 months; is trending up. Will recheck in Feb 2017 due to hx  of heart disease etc;  Will start back to water exercises for sugar and osteopenia/ will cut back on kool aid and sugary drinks   Also will check BP x 2 every week as moderately high today  Also may review [email or web address] and call for quesitons     During the course of the visit the patient was educated and counseled about the following appropriate screening and preventive services:   Vaccines to include Pneumoccal, Influenza, Hepatitis B, Td, Zostavax, HCV/ up to date  Electrocardiogram  Cardiovascular Disease  Colorectal cancer screening  Bone density screening  Diabetes screening  Glaucoma screening  Mammography/PAP  Nutrition counseling   Patient Instructions (the written plan) was given to the patient.   JIZXY,OFVWA, RN  06/26/2015   Medical screening examination/treatment/procedure(s) were performed by non-physician practitioner and as supervising physician I was immediately available for consultation/collaboration. I agree with above. [name], MD

## 2015-06-26 NOTE — Patient Instructions (Addendum)
[name] , Thank you for taking time to come for your Medicare Wellness Visit. I appreciate your ongoing commitment to your health goals. Please review the following plan we discussed and let me know if I can assist you in the future.   Had flu shot at Dr. Forde Dandy A1c which is the amount of sugar in your blood for 2 months; is trending up. Will recheck in Feb 2017 due to hx of heart disease etc;  Will start back to water exercises for sugar and osteopenia/   Also will check BP x 2 every week   Also may review [email or web address]   These are the goals we discussed: Goals    . patient     Discussed A1c was elevated; may want to have another drawn in Feb 2017;  States will try to drink sugar free koolaid or other Also; consider going back to the pool for water exercise;        This is a list of the screening recommended for you and due dates:  Health Maintenance  Topic Date Due  .  Hepatitis C: One time screening is recommended by Center for Disease Control  (CDC) for  adults born from [birth year] through 1967.   [birth year]  . Flu Shot  03/24/2015  . Mammogram  03/17/2017  . Colon Cancer Screening  10/09/2017  . Tetanus Vaccine  09/12/2018  . DEXA scan (bone density measurement)  Completed  . Shingles Vaccine  Completed  . Pneumonia vaccines  Completed     Screening for Type 2 Diabetes Screening is a way to check for type 2 diabetes in people who do not have symptoms of the disease, but who may likely develop diabetes in the future. Diabetes can lead to serious health problems, but finding diabetes early allows for early treatment. DIABETES RISK FACTORS   Family history of diabetes.  Diseases of the pancreas.  Obesity or being overweight.  Certain racial or ethnic groups:  American [place].  Pacific Islander.  Hispanic.  Asian.  African American.  High blood pressure (hypertension).  History of diabetes while pregnant (gestational diabetes).  Delivering a baby that  weighed over 9 pounds.  Being inactive.  High cholesterol or triglycerides.  Age, especially over 35 years of age.  Other diseases or conditions.  Diseases of the pancreas.  Cardiovascular disease.  Disorders of the endocrine system.  Certain medicines, such as those that treat high blood cholesterol levels. WHO IS SCREENED Adults  Adults who have no risk factors and no symptoms should be screened starting at age 78. If the screening tests are normal, they should be repeated every 3 years.  Adults who do not have symptoms, but have 1 or more risk factors, should be screened.  Adults who have 2 or more risk factors may be screened every year.  Adults who have an A1c (3 month average of blood glucose) greater than 5.7% or who had an impaired glucose tolerance (IGT) or impaired fasting glucose (IFG) on a previous test should be screened.  Pregnant women who have risk factors should be screened at their first prenatal visit.  Women who have given birth and had gestational diabetes should be screened 6-12 weeks after the child is born. This screening should be repeated every 1-3 years after the first test. Children or Adolescents  Children and adolescents should be screened for type 2 diabetes if they are overweight and have 2 of the following risk factors:  Having a family history of type 2  diabetes.  Being a member of a high risk race or ethnic group.  Having signs of insulin resistance or conditions associated with insulin resistance.  Having a mother who had gestational diabetes while pregnant with him or her.  Screening should start at age 48 or at the onset of puberty, whichever comes first. This should be repeated every 2 years. SCREENING In a screening, your caregiver may:  Ask questions about your overall health. This will include questions about the health of close family members, too.  Ask about any diabetes-like symptoms you may have.  Perform a physical  exam.  Order some tests that may include:  A fasting plasma glucose test. This measures the level of glucose in your blood. It is done after you have had nothing to eat but water (fasted) for 8 hours.  A random blood glucose test. This test is done without the need to fast.  An oral glucose tolerance test. This is a blood test done in 2 parts. First, a blood sample is taken after you have fasted. Then, another sample is taken after you drink a liquid that contains a lot of sugar.  An A1c test. This test shows how much glucose has been in your blood over the past 2 to 3 months.   This information is not intended to replace advice given to you by your health care provider. Make sure you discuss any questions you have with your health care provider.   Document Released: 06/05/2009 Document Revised: 08/30/2014 Document Reviewed: 03/17/2011 Elsevier Interactive Patient Education 2016 [place] in the Home  Falls can cause injuries. They can happen to people of all ages. There are many things you can do to make your home safe and to help prevent falls.  WHAT CAN I DO ON THE OUTSIDE OF MY HOME?  Regularly fix the edges of walkways and driveways and fix any cracks.  Remove anything that might make you trip as you walk through a door, such as a raised step or threshold.  Trim any bushes or trees on the path to your home.  Use bright outdoor lighting.  Clear any walking paths of anything that might make someone trip, such as rocks or tools.  Regularly check to see if handrails are loose or broken. Make sure that both sides of any steps have handrails.  Any raised decks and porches should have guardrails on the edges.  Have any leaves, snow, or ice cleared regularly.  Use sand or salt on walking paths during winter.  Clean up any spills in your garage right away. This includes oil or grease spills. WHAT CAN I DO IN THE BATHROOM?   Use night lights.  Install grab  bars by the toilet and in the tub and shower. Do not use towel bars as grab bars.  Use non-skid mats or decals in the tub or shower.  If you need to sit down in the shower, use a plastic, non-slip stool.  Keep the floor dry. Clean up any water that spills on the floor as soon as it happens.  Remove soap buildup in the tub or shower regularly.  Attach bath mats securely with double-sided non-slip rug tape.  Do not have throw rugs and other things on the floor that can make you trip. WHAT CAN I DO IN THE BEDROOM?  Use night lights.  Make sure that you have a light by your bed that is easy to reach.  Do not use any sheets  or blankets that are too big for your bed. They should not hang down onto the floor.  Have a firm chair that has side arms. You can use this for support while you get dressed.  Do not have throw rugs and other things on the floor that can make you trip. WHAT CAN I DO IN THE KITCHEN?  Clean up any spills right away.  Avoid walking on wet floors.  Keep items that you use a lot in easy-to-reach places.  If you need to reach something above you, use a strong step stool that has a grab bar.  Keep electrical cords out of the way.  Do not use floor polish or wax that makes floors slippery. If you must use wax, use non-skid floor wax.  Do not have throw rugs and other things on the floor that can make you trip. WHAT CAN I DO WITH MY STAIRS?  Do not leave any items on the stairs.  Make sure that there are handrails on both sides of the stairs and use them. Fix handrails that are broken or loose. Make sure that handrails are as long as the stairways.  Check any carpeting to make sure that it is firmly attached to the stairs. Fix any carpet that is loose or worn.  Avoid having throw rugs at the top or bottom of the stairs. If you do have throw rugs, attach them to the floor with carpet tape.  Make sure that you have a light switch at the top of the stairs and the  bottom of the stairs. If you do not have them, ask someone to add them for you. WHAT ELSE CAN I DO TO HELP PREVENT FALLS?  Wear shoes that:  Do not have high heels.  Have rubber bottoms.  Are comfortable and fit you well.  Are closed at the toe. Do not wear sandals.  If you use a stepladder:  Make sure that it is fully opened. Do not climb a closed stepladder.  Make sure that both sides of the stepladder are locked into place.  Ask someone to hold it for you, if possible.  Clearly mark and make sure that you can see:  Any grab bars or handrails.  First and last steps.  Where the edge of each step is.  Use tools that help you move around (mobility aids) if they are needed. These include:  Canes.  Walkers.  Scooters.  Crutches.  Turn on the lights when you go into a dark area. Replace any light bulbs as soon as they burn out.  Set up your furniture so you have a clear path. Avoid moving your furniture around.  If any of your floors are uneven, fix them.  If there are any pets around you, be aware of where they are.  Review your medicines with your doctor. Some medicines can make you feel dizzy. This can increase your chance of falling. Ask your doctor what other things that you can do to help prevent falls.   This information is not intended to replace advice given to you by your health care provider. Make sure you discuss any questions you have with your health care provider.   Document Released: 06/05/2009 Document Revised: 12/24/2014 Document Reviewed: 09/13/2014 Elsevier Interactive Patient Education 2016 [place] Maintenance, Female Adopting a healthy lifestyle and getting preventive care can go a long way to promote health and wellness. Talk with your health care provider about what schedule of regular examinations is  right for you. This is a good chance for you to check in with your provider about disease prevention and staying healthy. In  between checkups, there are plenty of things you can do on your own. Experts have done a lot of research about which lifestyle changes and preventive measures are most likely to keep you healthy. Ask your health care provider for more information. WEIGHT AND DIET  Eat a healthy diet  Be sure to include plenty of vegetables, fruits, low-fat dairy products, and lean protein.  Do not eat a lot of foods high in solid fats, added sugars, or salt.  Get regular exercise. This is one of the most important things you can do for your health.  Most adults should exercise for at least 150 minutes each week. The exercise should increase your heart rate and make you sweat (moderate-intensity exercise).  Most adults should also do strengthening exercises at least twice a week. This is in addition to the moderate-intensity exercise.  Maintain a healthy weight  Body mass index (BMI) is a measurement that can be used to identify possible weight problems. It estimates body fat based on height and weight. Your health care provider can help determine your BMI and help you achieve or maintain a healthy weight.  For females 65 years of age and older:   A BMI below 18.5 is considered underweight.  A BMI of 18.5 to 24.9 is normal.  A BMI of 25 to 29.9 is considered overweight.  A BMI of 30 and above is considered obese.  Watch levels of cholesterol and blood lipids  You should start having your blood tested for lipids and cholesterol at 71 years of age, then have this test every 5 years.  You may need to have your cholesterol levels checked more often if:  Your lipid or cholesterol levels are high.  You are older than 71 years of age.  You are at high risk for heart disease.  CANCER SCREENING   Lung Cancer  Lung cancer screening is recommended for adults 70-6 years old who are at high risk for lung cancer because of a history of smoking.  A yearly low-dose CT scan of the lungs is recommended  for people who:  Currently smoke.  Have quit within the past 15 years.  Have at least a 30-pack-year history of smoking. A pack year is smoking an average of one pack of cigarettes a day for 1 year.  Yearly screening should continue until it has been 15 years since you quit.  Yearly screening should stop if you develop a health problem that would prevent you from having lung cancer treatment.  Breast Cancer  Practice breast self-awareness. This means understanding how your breasts normally appear and feel.  It also means doing regular breast self-exams. Let your health care provider know about any changes, no matter how small.  If you are in your 20s or 30s, you should have a clinical breast exam (CBE) by a health care provider every 1-3 years as part of a regular health exam.  If you are 49 or older, have a CBE every year. Also consider having a breast X-ray (mammogram) every year.  If you have a family history of breast cancer, talk to your health care provider about genetic screening.  If you are at high risk for breast cancer, talk to your health care provider about having an MRI and a mammogram every year.  Breast cancer gene (BRCA) assessment is recommended for women who  have family members with BRCA-related cancers. BRCA-related cancers include:  Breast.  Ovarian.  Tubal.  Peritoneal cancers.  Results of the assessment will determine the need for genetic counseling and BRCA1 and BRCA2 testing. Cervical Cancer Your health care provider may recommend that you be screened regularly for cancer of the pelvic organs (ovaries, uterus, and vagina). This screening involves a pelvic examination, including checking for microscopic changes to the surface of your cervix (Pap test). You may be encouraged to have this screening done every 3 years, beginning at age 21.  For women ages 72-65, health care providers may recommend pelvic exams and Pap testing every 3 years, or they may  recommend the Pap and pelvic exam, combined with testing for human papilloma virus (HPV), every 5 years. Some types of HPV increase your risk of cervical cancer. Testing for HPV may also be done on women of any age with unclear Pap test results.  Other health care providers may not recommend any screening for nonpregnant women who are considered low risk for pelvic cancer and who do not have symptoms. Ask your health care provider if a screening pelvic exam is right for you.  If you have had past treatment for cervical cancer or a condition that could lead to cancer, you need Pap tests and screening for cancer for at least 20 years after your treatment. If Pap tests have been discontinued, your risk factors (such as having a new sexual partner) need to be reassessed to determine if screening should resume. Some women have medical problems that increase the chance of getting cervical cancer. In these cases, your health care provider may recommend more frequent screening and Pap tests. Colorectal Cancer  This type of cancer can be detected and often prevented.  Routine colorectal cancer screening usually begins at 71 years of age and continues through 71 years of age.  Your health care provider may recommend screening at an earlier age if you have risk factors for colon cancer.  Your health care provider may also recommend using home test kits to check for hidden blood in the stool.  A small camera at the end of a tube can be used to examine your colon directly (sigmoidoscopy or colonoscopy). This is done to check for the earliest forms of colorectal cancer.  Routine screening usually begins at age 65.  Direct examination of the colon should be repeated every 5-10 years through 71 years of age. However, you may need to be screened more often if early forms of precancerous polyps or small growths are found. Skin Cancer  Check your skin from head to toe regularly.  Tell your health care provider  about any new moles or changes in moles, especially if there is a change in a mole's shape or color.  Also tell your health care provider if you have a mole that is larger than the size of a pencil eraser.  Always use sunscreen. Apply sunscreen liberally and repeatedly throughout the day.  Protect yourself by wearing long sleeves, pants, a wide-brimmed hat, and sunglasses whenever you are outside. HEART DISEASE, DIABETES, AND HIGH BLOOD PRESSURE   High blood pressure causes heart disease and increases the risk of stroke. High blood pressure is more likely to develop in:  People who have blood pressure in the high end of the normal range (130-139/85-89 mm Hg).  People who are overweight or obese.  People who are African American.  If you are 41-74 years of age, have your blood pressure checked  every 3-5 years. If you are 40 years of age or older, have your blood pressure checked every year. You should have your blood pressure measured twice--once when you are at a hospital or clinic, and once when you are not at a hospital or clinic. Record the average of the two measurements. To check your blood pressure when you are not at a hospital or clinic, you can use:  An automated blood pressure machine at a pharmacy.  A home blood pressure monitor.  If you are between 24 years and 32 years old, ask your health care provider if you should take aspirin to prevent strokes.  Have regular diabetes screenings. This involves taking a blood sample to check your fasting blood sugar level.  If you are at a normal weight and have a low risk for diabetes, have this test once every three years after 71 years of age.  If you are overweight and have a high risk for diabetes, consider being tested at a younger age or more often. PREVENTING INFECTION  Hepatitis B  If you have a higher risk for hepatitis B, you should be screened for this virus. You are considered at high risk for hepatitis B if:  You were  born in a country where hepatitis B is common. Ask your health care provider which countries are considered high risk.  Your parents were born in a high-risk country, and you have not been immunized against hepatitis B (hepatitis B vaccine).  You have HIV or AIDS.  You use needles to inject street drugs.  You live with someone who has hepatitis B.  You have had sex with someone who has hepatitis B.  You get hemodialysis treatment.  You take certain medicines for conditions, including cancer, organ transplantation, and autoimmune conditions. Hepatitis C  Blood testing is recommended for:  Everyone born from [birth year] through 1967.  Anyone with known risk factors for hepatitis C. Sexually transmitted infections (STIs)  You should be screened for sexually transmitted infections (STIs) including gonorrhea and chlamydia if:  You are sexually active and are younger than 71 years of age.  You are older than 71 years of age and your health care provider tells you that you are at risk for this type of infection.  Your sexual activity has changed since you were last screened and you are at an increased risk for chlamydia or gonorrhea. Ask your health care provider if you are at risk.  If you do not have HIV, but are at risk, it may be recommended that you take a prescription medicine daily to prevent HIV infection. This is called pre-exposure prophylaxis (PrEP). You are considered at risk if:  You are sexually active and do not regularly use condoms or know the HIV status of your partner(s).  You take drugs by injection.  You are sexually active with a partner who has HIV. Talk with your health care provider about whether you are at high risk of being infected with HIV. If you choose to begin PrEP, you should first be tested for HIV. You should then be tested every 3 months for as long as you are taking PrEP.  PREGNANCY   If you are premenopausal and you may become pregnant, ask your  health care provider about preconception counseling.  If you may become pregnant, take 400 to 800 micrograms (mcg) of folic acid every day.  If you want to prevent pregnancy, talk to your health care provider about birth control (contraception). OSTEOPOROSIS AND MENOPAUSE  Osteoporosis is a disease in which the bones lose minerals and strength with aging. This can result in serious bone fractures. Your risk for osteoporosis can be identified using a bone density scan.  If you are 42 years of age or older, or if you are at risk for osteoporosis and fractures, ask your health care provider if you should be screened.  Ask your health care provider whether you should take a calcium or vitamin D supplement to lower your risk for osteoporosis.  Menopause may have certain physical symptoms and risks.  Hormone replacement therapy may reduce some of these symptoms and risks. Talk to your health care provider about whether hormone replacement therapy is right for you.  HOME CARE INSTRUCTIONS   Schedule regular health, dental, and eye exams.  Stay current with your immunizations.   Do not use any tobacco products including cigarettes, chewing tobacco, or electronic cigarettes.  If you are pregnant, do not drink alcohol.  If you are breastfeeding, limit how much and how often you drink alcohol.  Limit alcohol intake to no more than 1 drink per day for nonpregnant women. One drink equals 12 ounces of beer, 5 ounces of wine, or 1 ounces of hard liquor.  Do not use street drugs.  Do not share needles.  Ask your health care provider for help if you need support or information about quitting drugs.  Tell your health care provider if you often feel depressed.  Tell your health care provider if you have ever been abused or do not feel safe at home.   This information is not intended to replace advice given to you by your health care provider. Make sure you discuss any questions you have with  your health care provider.   Document Released: 02/22/2011 Document Revised: 08/30/2014 Document Reviewed: 07/11/2013 Elsevier Interactive Patient Education [employer].

## 2015-07-02 DIAGNOSIS — Z96651 Presence of right artificial knee joint: Secondary | ICD-10-CM | POA: Diagnosis not present

## 2015-07-02 DIAGNOSIS — S92255D Nondisplaced fracture of navicular [scaphoid] of left foot, subsequent encounter for fracture with routine healing: Secondary | ICD-10-CM | POA: Diagnosis not present

## 2015-07-02 DIAGNOSIS — Z471 Aftercare following joint replacement surgery: Secondary | ICD-10-CM | POA: Diagnosis not present

## 2015-07-03 NOTE — Telephone Encounter (Signed)
Encounter complete. 

## 2015-07-04 ENCOUNTER — Telehealth (HOSPITAL_COMMUNITY): Payer: Self-pay

## 2015-07-04 NOTE — Telephone Encounter (Signed)
Encounter complete. 

## 2015-07-08 ENCOUNTER — Ambulatory Visit (HOSPITAL_COMMUNITY)
Admission: RE | Admit: 2015-07-08 | Discharge: 2015-07-08 | Disposition: A | Discharge status: Home / Self Care | Payer: Medicare Other | Source: Ambulatory Visit | Attending: Cardiovascular Disease | Admitting: Cardiovascular Disease

## 2015-07-08 DIAGNOSIS — R0602 Shortness of breath: Secondary | ICD-10-CM | POA: Insufficient documentation

## 2015-07-08 DIAGNOSIS — R0609 Other forms of dyspnea: Secondary | ICD-10-CM | POA: Insufficient documentation

## 2015-07-08 DIAGNOSIS — Z8249 Family history of ischemic heart disease and other diseases of the circulatory system: Secondary | ICD-10-CM | POA: Insufficient documentation

## 2015-07-08 DIAGNOSIS — I10 Essential (primary) hypertension: Secondary | ICD-10-CM | POA: Insufficient documentation

## 2015-07-08 DIAGNOSIS — Z87891 Personal history of nicotine dependence: Secondary | ICD-10-CM | POA: Diagnosis not present

## 2015-07-08 DIAGNOSIS — R0789 Other chest pain: Secondary | ICD-10-CM | POA: Diagnosis not present

## 2015-07-08 DIAGNOSIS — I739 Peripheral vascular disease, unspecified: Secondary | ICD-10-CM | POA: Insufficient documentation

## 2015-07-08 DIAGNOSIS — R079 Chest pain, unspecified: Secondary | ICD-10-CM

## 2015-07-08 LAB — MYOCARDIAL PERFUSION IMAGING
Estimated workload: 1 METS
LV dias vol: 64 mL
LV sys vol: 23 mL
Peak HR: 90 {beats}/min
Percent of predicted max HR: 60 %
Rest HR: 72 {beats}/min
SDS: 3
SRS: 0
SSS: 3
Stage 1 DBP: 100 mmHg
Stage 1 Grade: 0 %
Stage 1 HR: 66 {beats}/min
Stage 1 SBP: 153 mmHg
Stage 1 Speed: 0 mph
Stage 2 Grade: 0 %
Stage 2 HR: 66 {beats}/min
Stage 2 Speed: 0 mph
Stage 3 Grade: 0 %
Stage 3 HR: 90 {beats}/min
Stage 3 Speed: 0 mph
Stage 4 DBP: 91 mmHg
Stage 4 Grade: 0 %
Stage 4 HR: 81 {beats}/min
Stage 4 SBP: 166 mmHg
Stage 4 Speed: 0 mph
TID: 0.98

## 2015-07-08 MED ORDER — REGADENOSON 0.4 MG/5ML IV SOLN
0.4000 mg | Freq: Once | INTRAVENOUS | Status: AC
  Administered 2015-07-08: 0.4 mg via INTRAVENOUS

## 2015-07-08 MED ORDER — TECHNETIUM TC 99M TETROFOSMIN IV KIT
31.4000 | PACK | Freq: Once | INTRAVENOUS | Status: AC | PRN
  Administered 2015-07-08: 31 via INTRAVENOUS

## 2015-07-08 MED ORDER — TECHNETIUM TC 99M SESTAMIBI GENERIC - CARDIOLITE
11.0000 | Freq: Once | INTRAVENOUS | Status: AC | PRN
  Administered 2015-07-08: 11 via INTRAVENOUS

## 2015-07-09 ENCOUNTER — Encounter: Payer: Self-pay | Admitting: Gastroenterology

## 2015-07-09 ENCOUNTER — Telehealth: Payer: Self-pay | Admitting: Cardiology

## 2015-07-09 ENCOUNTER — Ambulatory Visit (INDEPENDENT_AMBULATORY_CARE_PROVIDER_SITE_OTHER): Payer: Medicare Other | Admitting: Gastroenterology

## 2015-07-09 VITALS — BP 146/84 | HR 72 | Ht 63.0 in | Wt 134.2 lb

## 2015-07-09 DIAGNOSIS — R1314 Dysphagia, pharyngoesophageal phase: Secondary | ICD-10-CM | POA: Diagnosis not present

## 2015-07-09 DIAGNOSIS — K219 Gastro-esophageal reflux disease without esophagitis: Secondary | ICD-10-CM

## 2015-07-09 NOTE — Telephone Encounter (Signed)
F/u  Pt returning RN phone call

## 2015-07-09 NOTE — Telephone Encounter (Signed)
Left message to call back  

## 2015-07-09 NOTE — Progress Notes (Signed)
Review of pertinent gastrointestinal problems:  1. routine risk for colon cancer, colonoscopy February 2009 , Dr. Ardis Hughs, left-sided diverticulosis without any polyps. Recall colonoscopy at 10 year interval 2. Mild gastritis, HH on EGD 01/2013 Ardis Hughs, done for thickened distal esophagus on CT. h pylori negative.   HPI: This is a  Very pleasant 69 yo woman  Chief complaint is GERD, chest pain, dyspepsia  Thought she was having a heart attack.  Went to ER, cardiac workup.  Was having chest pressure, pains.  She was put on pantoprazole, one pill twice daily since then.  Has been on for three weeks.  She is bothered at night burning in the chest.  She takes on pantoprazole one just before lung and one at bedtime.  Intermittent solid food dysphagia.  This is not necessarily new to her but more noticeable.  She has been gaining weight lately.  About a year of intermittent dyspepsia.  Associated with nausea as well.  She takes BC powders, 3-4 per week.  No other NSAIDs.   Labs 05/2015: cbc, cmet both essentially normal.  Up 4 pounds since last visit in this office 04/2014   Past Medical History  Diagnosis Date  . Coronary artery disease   . Hyperlipidemia   . History of rheumatic fever   . Anemia   . OSTEOPENIA   . Insomnia   . COLONIC POLYPS, HX OF   . Hypothyroidism   . Hypertension   . Gastroesophageal reflux disease   . PVD (peripheral vascular disease) ([place]) 10/31/2011  . Vitamin D deficiency   . RSD (reflex sympathetic dystrophy) 10/31/2011  . PAD (peripheral artery disease) ([place]) 12/03/2011    status post right SFA [place] atherectomy  . COPD (chronic obstructive pulmonary disease) ([place])     no per pt on 03/21/2014  . Osteoarthritis   . DISC DISEASE, CERVICAL   . Arthritis     "my whole body"  . Heart murmur     as a child  . Anxiety     hx    Past Surgical History  Procedure Laterality Date  . Abdominal hysterectomy  1977    "partial"  . Thyroidectomy  1995   . Tonsillectomy    . Carpal tunnel release Right 2005  . Cataract extraction Left 2013  . Balloon angioplasty, artery Right 03/21/2014    SFA  . Dilation and curettage of uterus  1978  . Coronary artery bypass graft  10/2009    LIMA to the LAD, saphenous vein graft to the acute marginal, saphenous vein graft to the diagonal and obtuse marginal.  . Cardiac catheterization  2011   . Total knee arthroplasty Right 06/18/2014    Procedure: RIGHT TOTAL KNEE ARTHROPLASTY;  Surgeon: Tobi Bastos, MD;  Location: [facility];  Service: Orthopedics;  Laterality: Right;  . Lower extremity angiogram N/A 03/21/2014    Procedure: LOWER EXTREMITY ANGIOGRAM;  Surgeon: Lorretta Harp, MD;  Location: [facility] CATH LAB;  Service: Cardiovascular;  Laterality: N/A;    Current Outpatient Prescriptions  Medication Sig Dispense Refill  . amLODipine (NORVASC) 10 MG tablet TAKE ONE TABLET BY MOUTH ONCE DAILY 90 tablet 3  . aspirin 325 MG tablet Take 325 mg by mouth daily.    . Aspirin-Salicylamide-Caffeine (BC HEADACHE POWDER PO) Take 1 packet by mouth 3 (three) times daily as needed (headache).    . Calcium Carb-Cholecalciferol (CALCIUM 1000 + D PO) Take 1 tablet by mouth daily at 2 PM daily at 2 PM.     .  hydrochlorothiazide (HYDRODIURIL) 25 MG tablet Take 25 mg by mouth daily after lunch.     . levothyroxine (SYNTHROID, LEVOTHROID) 100 MCG tablet Take 1 tablet (100 mcg total) by mouth daily before breakfast. 30 tablet 3  . meclizine (ANTIVERT) 25 MG tablet Take 1 tablet (25 mg total) by mouth 3 (three) times daily as needed for dizziness. 30 tablet 0  . methocarbamol (ROBAXIN) 500 MG tablet Take 1 tablet (500 mg total) by mouth every 6 (six) hours as needed for muscle spasms. 40 tablet 1  . Multiple Vitamin (MULTIVITAMIN WITH MINERALS) TABS tablet Take 1 tablet by mouth every morning. Centrum    . pantoprazole (PROTONIX) 40 MG tablet Take 1 tablet (40 mg total) by mouth 2 (two) times daily. 60 tablet 2  . PRENATAL  VITAMINS PO Take 1 capsule by mouth every morning.     Marland Kitchen tiZANidine (ZANAFLEX) 4 MG tablet Take 1 tablet (4 mg total) by mouth every 6 (six) hours as needed for muscle spasms. 40 tablet 1   No current facility-administered medications for this visit.   Facility-Administered Medications Ordered in Other Visits  Medication Dose Route Frequency Provider Last Rate Last Dose  . tranexamic acid (CYKLOKAPRON) 2,000 mg in sodium chloride 0.9 % 50 mL Topical Application  4,562 mg Topical Once [employer], PA-C        Allergies as of 07/09/2015 - Review Complete 07/09/2015  Allergen Reaction Noted  . Lipitor [atorvastatin] Other (See Comments) 04/30/2014    Family History  Problem Relation Age of Onset  . Arthritis Maternal Aunt   . Heart disease Maternal Aunt   . Hypertension Maternal Aunt   . Diabetes Maternal Aunt   . Colon cancer Neg Hx   . Kidney disease Neg Hx   . Liver disease Neg Hx   . Throat cancer Neg Hx   . Stomach cancer Neg Hx     Social History   Social History  . Marital Status: Single    Spouse Name: N/A  . Number of Children: 2  . Years of Education: 14   Occupational History  . Retired    Social History Main Topics  . Smoking status: Former Smoker -- 0.80 packs/day for 45 years    Types: Cigarettes    Quit date: 08/23/2009  . Smokeless tobacco: Never Used  . Alcohol Use: No  . Drug Use: No  . Sexual Activity: Not Currently   Other Topics Concern  . Not on file   Social History Narrative     Physical Exam: BP 146/84 mmHg  Pulse 72  Ht '5\' 3"'$  (1.6 m)  Wt 134 lb 3.2 oz (60.873 kg)  BMI 23.78 kg/m2 Constitutional: generally well-appearing Psychiatric: alert and oriented x3 Abdomen: soft, nontender, nondistended, no obvious ascites, no peritoneal signs, normal bowel sounds   Assessment and plan: 69 y.o. female with GERD like dyspepsia, alarm symptoms of dysphagia  Repeat EGD, next available appt.  Advised her to change timing of PPIs as  well.   Owens Loffler, MD [facility] 07/09/2015, 8:37 AM

## 2015-07-09 NOTE — Patient Instructions (Addendum)
You will be set up for an upper endoscopy. You should change the way you are taking your antiacid medicine (pantoprazole) so that you are taking it 20-30 minutes prior to a decent meal as that is the way the pill is designed to work most effectively.  Take twice daily before BF and dinner meals.

## 2015-07-09 NOTE — Telephone Encounter (Signed)
Called patient about stress test results. Per Lyda Jester PA, low risk stress test. Patient verbalized understanding.

## 2015-07-25 ENCOUNTER — Ambulatory Visit (AMBULATORY_SURGERY_CENTER): Payer: Medicare Other | Admitting: Gastroenterology

## 2015-07-25 ENCOUNTER — Encounter: Payer: Self-pay | Admitting: Gastroenterology

## 2015-07-25 VITALS — BP 165/89 | HR 79 | Temp 96.7°F | Resp 16 | Ht 63.0 in | Wt 134.0 lb

## 2015-07-25 DIAGNOSIS — K219 Gastro-esophageal reflux disease without esophagitis: Secondary | ICD-10-CM | POA: Diagnosis present

## 2015-07-25 DIAGNOSIS — R1310 Dysphagia, unspecified: Secondary | ICD-10-CM | POA: Diagnosis not present

## 2015-07-25 DIAGNOSIS — I739 Peripheral vascular disease, unspecified: Secondary | ICD-10-CM | POA: Diagnosis not present

## 2015-07-25 MED ORDER — SODIUM CHLORIDE 0.9 % IV SOLN: 500.0000 mL | INTRAVENOUS | Status: DC

## 2015-07-25 NOTE — Progress Notes (Signed)
Report to PACU, RN, vss, BBS= Clear.  

## 2015-07-25 NOTE — Progress Notes (Signed)
ON LIFTING THE HEAD OF THE BED, NOTICED PATIENT WAS BREATHING DEEPER. PATIENT DENIED SHORTNESS OF BREATH OR CHEST PAIN. REATTACHED TO MONITOR, HEART RHYTHM REGULAR, SA02 97.PATIENT DRANK WATER AND FELT MUCH BETTER. VITAL SIGNS NORMAL AND PATIENT UP TO RESTROOM PRIOR TO LEAVING. NO CHEST PAIN OR DYSPNEA.

## 2015-07-25 NOTE — Patient Instructions (Addendum)
CONTINUE YOUR PROTONIX TWICE DAILY, 20-30 MINUTES PRIOR TO BREAKFAST AND DINNER MEALS)  CHEW YOUR FOOD WELL AND TAKE SMALL BITES.    YOU HAD AN ENDOSCOPIC PROCEDURE TODAY AT [facility]:   Refer to the procedure report that was given to you for any specific questions about what was found during the examination.  If the procedure report does not answer your questions, please call your gastroenterologist to clarify.  If you requested that your care partner not be given the details of your procedure findings, then the procedure report has been included in a sealed envelope for you to review at your convenience later.  YOU SHOULD EXPECT: Some feelings of bloating in the abdomen. Passage of more gas than usual.  Walking can help get rid of the air that was put into your GI tract during the procedure and reduce the bloating. If you had a lower endoscopy (such as a colonoscopy or flexible sigmoidoscopy) you may notice spotting of blood in your stool or on the toilet paper. If you underwent a bowel prep for your procedure, you may not have a normal bowel movement for a few days.  Please Note:  You might notice some irritation and congestion in your nose or some drainage.  This is from the oxygen used during your procedure.  There is no need for concern and it should clear up in a day or so.  SYMPTOMS TO REPORT IMMEDIATELY:   Following upper endoscopy (EGD)  Vomiting of blood or coffee ground material  New chest pain or pain under the shoulder blades  Painful or persistently difficult swallowing  New shortness of breath  Fever of 100F or higher  Black, tarry-looking stools  For urgent or emergent issues, a gastroenterologist can be reached at any hour by calling [phone].   DIET: Your first meal following the procedure should be a small meal and then it is ok to progress to your normal diet. Heavy or fried foods are harder to digest and may make you feel nauseous or bloated.   Likewise, meals heavy in dairy and vegetables can increase bloating.  Drink plenty of fluids but you should avoid alcoholic beverages for 24 hours.  ACTIVITY:  You should plan to take it easy for the rest of today and you should NOT DRIVE or use heavy machinery until tomorrow (because of the sedation medicines used during the test).    FOLLOW UP: Our staff will call the number listed on your records the next business day following your procedure to check on you and address any questions or concerns that you may have regarding the information given to you following your procedure. If we do not reach you, we will leave a message.  However, if you are feeling well and you are not experiencing any problems, there is no need to return our call.  We will assume that you have returned to your regular daily activities without incident.  If any biopsies were taken you will be contacted by phone or by letter within the next 1-3 weeks.  Please call us at [phone] if you have not heard about the biopsies in 3 weeks.    SIGNATURES/CONFIDENTIALITY: You and/or your care partner have signed paperwork which will be entered into your electronic medical record.  These signatures attest to the fact that that the information above on your After Visit Summary has been reviewed and is understood.  Full responsibility of the confidentiality of this discharge information lies with  you and/or your care-partner.

## 2015-07-25 NOTE — Op Note (Signed)
[place]  [address] [place], [id number]   ENDOSCOPY PROCEDURE REPORT  PATIENT: [name], [name]  MR#: [id number] BIRTHDATE: June 21, 1946 , 69  yrs. old GENDER: female ENDOSCOPIST: Milus Banister, MD PROCEDURE DATE:  07/25/2015 PROCEDURE:  EGD, diagnostic ASA CLASS:     Class III INDICATIONS:  GERD, intermittent dysphagia. MEDICATIONS: Monitored anesthesia care and Propofol 90 mg IV TOPICAL ANESTHETIC: none  DESCRIPTION OF PROCEDURE: After the risks benefits and alternatives of the procedure were thoroughly explained, informed consent was obtained.  The LB [id number] endoscope was introduced through the mouth and advanced to the second portion of the duodenum , Without limitations.  The instrument was slowly withdrawn as the mucosa was fully examined.    There was a medium sized (5cm) hiatal hernia.  There was LA Grade B reflux related esophagitis without stricture or stenosis.  The examination was otherwise normal.  Retroflexed views revealed no abnormalities.     The scope was then withdrawn from the patient and the procedure completed.  COMPLICATIONS: There were no immediate complications.  ENDOSCOPIC IMPRESSION: There was a medium sized (5cm) hiatal hernia.  There was LA Grade B reflux related esophagitis without stricture or stenosis.  The examination was otherwise normal  RECOMMENDATIONS: Continue Protonix twice daily (this is best taken 20-30 min prior to breakfast and dinner meals).  Chew your food well, eat slowly, take small bites.  eSigned:  Milus Banister, MD 07/25/2015 4:19 PM

## 2015-07-28 ENCOUNTER — Telehealth: Payer: Self-pay | Admitting: *Deleted

## 2015-07-28 NOTE — Telephone Encounter (Signed)
No answer, message left for the patient. 

## 2015-08-07 ENCOUNTER — Telehealth: Payer: Self-pay | Admitting: Family

## 2015-08-07 ENCOUNTER — Encounter: Payer: Self-pay | Admitting: Family

## 2015-08-07 ENCOUNTER — Ambulatory Visit (INDEPENDENT_AMBULATORY_CARE_PROVIDER_SITE_OTHER): Payer: Medicare Other | Admitting: Family

## 2015-08-07 ENCOUNTER — Other Ambulatory Visit (INDEPENDENT_AMBULATORY_CARE_PROVIDER_SITE_OTHER): Payer: Medicare Other

## 2015-08-07 VITALS — BP 152/86 | HR 75 | Temp 97.9°F | Resp 18 | Ht 63.0 in | Wt 135.0 lb

## 2015-08-07 DIAGNOSIS — E119 Type 2 diabetes mellitus without complications: Secondary | ICD-10-CM | POA: Diagnosis not present

## 2015-08-07 LAB — BASIC METABOLIC PANEL
BUN: 11 mg/dL (ref 6–23)
CO2: 30 mEq/L (ref 19–32)
Calcium: 9.2 mg/dL (ref 8.4–10.5)
Chloride: 105 mEq/L (ref 96–112)
Creatinine, Ser: 0.71 mg/dL (ref 0.40–1.20)
GFR: 104.31 mL/min (ref >60.00)
Glucose, Bld: 96 mg/dL (ref 70–99)
Potassium: 3.4 mEq/L — ABNORMAL LOW (ref 3.5–5.1)
Sodium: 142 mEq/L (ref 135–145)

## 2015-08-07 LAB — MICROALBUMIN / CREATININE URINE RATIO
Creatinine,U: 98.4 mg/dL
Microalb Creat Ratio: 11.9 mg/g (ref 0.0–30.0)
Microalb, Ur: 11.7 mg/dL — ABNORMAL HIGH (ref 0.0–1.9)

## 2015-08-07 LAB — HEMOGLOBIN A1C: Hgb A1c MFr Bld: 6.9 % — ABNORMAL HIGH (ref 4.6–6.5)

## 2015-08-07 MED ORDER — METFORMIN HCL 500 MG PO TABS: 500.0000 mg | ORAL_TABLET | Freq: Two times a day (BID) | ORAL | Status: DC

## 2015-08-07 NOTE — Telephone Encounter (Signed)
Please inform patient that her A1c has increased to 6.9. Therefore I have sent the metformin to her pharmacy as we discussed. Otherwise her blood work looks good.

## 2015-08-07 NOTE — Progress Notes (Signed)
Pre visit review using our clinic review tool, if applicable. No additional management support is needed unless otherwise documented below in the visit note. 

## 2015-08-07 NOTE — Progress Notes (Signed)
Subjective:    Patient ID: [name], female    DOB: Oct 14, 1945, 69 y.o.   MRN: [id number]  Chief Complaint  Patient presents with  . Diabetes    last a1c was done in august states that someone from here called last week and stated that she was diabetic    HPI:  [name] is a 69 y.o. female who  has a past medical history of Coronary artery disease; Hyperlipidemia; History of rheumatic fever; Anemia; OSTEOPENIA; Insomnia; COLONIC POLYPS, HX OF; Hypothyroidism; Hypertension; Gastroesophageal reflux disease; PVD (peripheral vascular disease) ([place]) (10/31/2011); Vitamin D deficiency; RSD (reflex sympathetic dystrophy) (10/31/2011); PAD (peripheral artery disease) ([place]) (12/03/2011); COPD (chronic obstructive pulmonary disease) ([place]); Osteoarthritis; DISC DISEASE, CERVICAL; Arthritis; Heart murmur; and Anxiety. and presents today for an office follow up.   1.) Type 2 diabetes - She has recently experienced the associated symptom of excessive urination, changes in vision, and fatigue. Notes that someone called her and informed her that she was pre-diabetic and would like to find out what is going on.  Lab Results  Component Value Date   HGBA1C 6.7* 04/04/2015    Allergies  Allergen Reactions  . Lipitor [Atorvastatin] Other (See Comments)    Muscle aches     Current Outpatient Prescriptions on File Prior to Visit  Medication Sig Dispense Refill  . amLODipine (NORVASC) 10 MG tablet TAKE ONE TABLET BY MOUTH ONCE DAILY 90 tablet 3  . aspirin 325 MG tablet Take 325 mg by mouth daily.    . Aspirin-Salicylamide-Caffeine (BC HEADACHE POWDER PO) Take 1 packet by mouth 3 (three) times daily as needed (headache).    . Calcium Carb-Cholecalciferol (CALCIUM 1000 + D PO) Take 1 tablet by mouth daily at 2 PM daily at 2 PM.     . co-enzyme Q-10 30 MG capsule Take 30 mg by mouth 3 (three) times daily.    . hydrochlorothiazide (HYDRODIURIL) 25 MG tablet Take 25 mg by mouth daily after  lunch.     . levothyroxine (SYNTHROID, LEVOTHROID) 100 MCG tablet Take 1 tablet (100 mcg total) by mouth daily before breakfast. 30 tablet 3  . meclizine (ANTIVERT) 25 MG tablet Take 1 tablet (25 mg total) by mouth 3 (three) times daily as needed for dizziness. 30 tablet 0  . methocarbamol (ROBAXIN) 500 MG tablet Take 1 tablet (500 mg total) by mouth every 6 (six) hours as needed for muscle spasms. 40 tablet 1  . Multiple Vitamin (MULTIVITAMIN WITH MINERALS) TABS tablet Take 1 tablet by mouth every morning. Centrum    . pantoprazole (PROTONIX) 40 MG tablet Take 1 tablet (40 mg total) by mouth 2 (two) times daily. 60 tablet 2  . Pravastatin Sodium (PRAVACHOL PO) Take by mouth.    Marland Kitchen PRENATAL VITAMINS PO Take 1 capsule by mouth every morning.     Marland Kitchen tiZANidine (ZANAFLEX) 4 MG tablet Take 1 tablet (4 mg total) by mouth every 6 (six) hours as needed for muscle spasms. 40 tablet 1   Current Facility-Administered Medications on File Prior to Visit  Medication Dose Route Frequency Provider Last Rate Last Dose  . tranexamic acid (CYKLOKAPRON) 2,000 mg in sodium chloride 0.9 % 50 mL Topical Application  4,193 mg Topical Once [employer], PA-C         Review of Systems  Constitutional: Positive for fatigue. Negative for fever and chills.  Endocrine: Positive for polydipsia and polyuria.      Objective:    BP 152/86 mmHg  Pulse  75  Temp(Src) 97.9 F (36.6 C) (Oral)  Resp 18  Ht '5\' 3"'$  (1.6 m)  Wt 135 lb (61.236 kg)  BMI 23.92 kg/m2  SpO2 97% Nursing note and vital signs reviewed.  Physical Exam  Constitutional: She is oriented to person, place, and time. She appears well-developed and well-nourished. No distress.  Cardiovascular: Normal rate, regular rhythm, normal heart sounds and intact distal pulses.   Pulmonary/Chest: Effort normal and breath sounds normal.  Neurological: She is alert and oriented to person, place, and time.  Skin: Skin is warm and dry.  Psychiatric: She has a  normal mood and affect. Her behavior is normal. Judgment and thought content normal.       Assessment & Plan:   Problem List Items Addressed This Visit      Endocrine   Type 2 diabetes mellitus ([place]) - Primary    Previous A1c consistent with diagnosis of Type 2 diabetes. Not currently maintained on medication. Obtain A1c, BMET and urine microalbumin. Encouraged to complete annual diabetic eye exam. She is currently on pravastatin for hyperlipidemia and CAD risk reduction. Medication pending A1c results. Discussed monitoring and potential for end organ damage with uncontrolled blood sugars. Follow up in 3 months.       Relevant Orders   Hemoglobin E3O   Basic Metabolic Panel (BMET)   Urine Microalbumin w/creat. ratio

## 2015-08-07 NOTE — Patient Instructions (Signed)
Thank you for choosing [facility].  Summary/Instructions:  Your prescription(s) have been submitted to your pharmacy or been printed and provided for you. Please take as directed and contact our office if you believe you are having problem(s) with the medication(s) or have any questions.  Please stop by the lab on the basement level of the building for your blood work. Your results will be released to [place] (or called to you) after review, usually within 72 hours after test completion. If any changes need to be made, you will be notified at that same time.  Type 2 Diabetes Mellitus, Adult Type 2 diabetes mellitus, often simply referred to as type 2 diabetes, is a long-lasting (chronic) disease. In type 2 diabetes, the pancreas does not make enough insulin (a hormone), the cells are less responsive to the insulin that is made (insulin resistance), or both. Normally, insulin moves sugars from food into the tissue cells. The tissue cells use the sugars for energy. The lack of insulin or the lack of normal response to insulin causes excess sugars to build up in the blood instead of going into the tissue cells. As a result, high blood sugar (hyperglycemia) develops. The effect of high sugar (glucose) levels can cause many complications. Type 2 diabetes was also previously called adult-onset diabetes, but it can occur at any age.  RISK FACTORS  A person is predisposed to developing type 2 diabetes if someone in the family has the disease and also has one or more of the following primary risk factors:  Weight gain, or being overweight or obese.  An inactive lifestyle.  A history of consistently eating high-calorie foods. Maintaining a normal weight and regular physical activity can reduce the chance of developing type 2 diabetes. SYMPTOMS  A person with type 2 diabetes may not show symptoms initially. The symptoms of type 2 diabetes appear slowly. The symptoms include:  Increased thirst  (polydipsia).  Increased urination (polyuria).  Increased urination during the night (nocturia).  Sudden or unexplained weight changes.  Frequent, recurring infections.  Tiredness (fatigue).  Weakness.  Vision changes, such as blurred vision.  Fruity smell to your breath.  Abdominal pain.  Nausea or vomiting.  Cuts or bruises which are slow to heal.  Tingling or numbness in the hands or feet.  An open skin wound (ulcer). DIAGNOSIS Type 2 diabetes is frequently not diagnosed until complications of diabetes are present. Type 2 diabetes is diagnosed when symptoms or complications are present and when blood glucose levels are increased. Your blood glucose level may be checked by one or more of the following blood tests:  A fasting blood glucose test. You will not be allowed to eat for at least 8 hours before a blood sample is taken.  A random blood glucose test. Your blood glucose is checked at any time of the day regardless of when you ate.  A hemoglobin A1c blood glucose test. A hemoglobin A1c test provides information about blood glucose control over the previous 3 months.  An oral glucose tolerance test (OGTT). Your blood glucose is measured after you have not eaten (fasted) for 2 hours and then after you drink a glucose-containing beverage. TREATMENT   You may need to take insulin or diabetes medicine daily to keep blood glucose levels in the desired range.  If you use insulin, you may need to adjust the dosage depending on the carbohydrates that you eat with each meal or snack.  Lifestyle changes are recommended as part of your treatment.  These may include:  Following an individualized diet plan developed by a nutritionist or dietitian.  Exercising daily. Your health care providers will set individualized treatment goals for you based on your age, your medicines, how long you have had diabetes, and any other medical conditions you have. Generally, the goal of  treatment is to maintain the following blood glucose levels:  Before meals (preprandial): 80-130 mg/dL.  After meals (postprandial): below 180 mg/dL.  A1c: less than 6.5-7%. HOME CARE INSTRUCTIONS   Have your hemoglobin A1c level checked twice a year.  Perform daily blood glucose monitoring as directed by your health care provider.  Monitor urine ketones when you are ill and as directed by your health care provider.  Take your diabetes medicine or insulin as directed by your health care provider to maintain your blood glucose levels in the desired range.  Never run out of diabetes medicine or insulin. It is needed every day.  If you are using insulin, you may need to adjust the amount of insulin given based on your intake of carbohydrates. Carbohydrates can raise blood glucose levels but need to be included in your diet. Carbohydrates provide vitamins, minerals, and fiber which are an essential part of a healthy diet. Carbohydrates are found in fruits, vegetables, whole grains, dairy products, legumes, and foods containing added sugars.  Eat healthy foods. You should make an appointment to see a registered dietitian to help you create an eating plan that is right for you.  Lose weight if you are overweight.  Carry a medical alert card or wear your medical alert jewelry.  Carry a 15-gram carbohydrate snack with you at all times to treat low blood glucose (hypoglycemia). Some examples of 15-gram carbohydrate snacks include:  Glucose tablets, 3 or 4.  Glucose gel, 15-gram tube.  Raisins, 2 tablespoons (24 grams).  Jelly beans, 6.  Animal crackers, 8.  Regular pop, 4 ounces (120 mL).  Gummy treats, 9.  Recognize hypoglycemia. Hypoglycemia occurs with blood glucose levels of 70 mg/dL and below. The risk for hypoglycemia increases when fasting or skipping meals, during or after intense exercise, and during sleep. Hypoglycemia symptoms can include:  Tremors or  shakes.  Decreased ability to concentrate.  Sweating.  Increased heart rate.  Headache.  Dry mouth.  Hunger.  Irritability.  Anxiety.  Restless sleep.  Altered speech or coordination.  Confusion.  Treat hypoglycemia promptly. If you are alert and able to safely swallow, follow the 15:15 rule:  Take 15-20 grams of rapid-acting glucose or carbohydrate. Rapid-acting options include glucose gel, glucose tablets, or 4 ounces (120 mL) of fruit juice, regular soda, or low-fat milk.  Check your blood glucose level 15 minutes after taking the glucose.  Take 15-20 grams more of glucose if the repeat blood glucose level is still 70 mg/dL or below.  Eat a meal or snack within 1 hour once blood glucose levels return to normal.  Be alert to feeling very thirsty and urinating more frequently than usual, which are early signs of hyperglycemia. An early awareness of hyperglycemia allows for prompt treatment. Treat hyperglycemia as directed by your health care provider.  Engage in at least 150 minutes of moderate-intensity physical activity a week, spread over at least 3 days of the week or as directed by your health care provider. In addition, you should engage in resistance exercise at least 2 times a week or as directed by your health care provider. Try to spend no more than 90 minutes at one time inactive.  Adjust your medicine and food intake as needed if you start a new exercise or sport.  Follow your sick-day plan anytime you are unable to eat or drink as usual.  Do not use any tobacco products including cigarettes, chewing tobacco, or electronic cigarettes. If you need help quitting, ask your health care provider.  Limit alcohol intake to no more than 1 drink per day for nonpregnant women and 2 drinks per day for men. You should drink alcohol only when you are also eating food. Talk with your health care provider whether alcohol is safe for you. Tell your health care provider if you  drink alcohol several times a week.  Keep all follow-up visits as directed by your health care provider. This is important.  Schedule an eye exam soon after the diagnosis of type 2 diabetes and then annually.  Perform daily skin and foot care. Examine your skin and feet daily for cuts, bruises, redness, nail problems, bleeding, blisters, or sores. A foot exam by a health care provider should be done annually.  Brush your teeth and gums at least twice a day and floss at least once a day. Follow up with your dentist regularly.  Share your diabetes management plan with your workplace or school.  Keep your immunizations up to date. It is recommended that you receive a flu (influenza) vaccine every year. It is also recommended that you receive a pneumonia (pneumococcal) vaccine. If you are 21 years of age or older and have never received a pneumonia vaccine, this vaccine may be given as a series of two separate shots. Ask your health care provider which additional vaccines may be recommended.  Learn to manage stress.  Obtain ongoing diabetes education and support as needed.  Participate in or seek rehabilitation as needed to maintain or improve independence and quality of life. Request a physical or occupational therapy referral if you are having foot or hand numbness, or difficulties with grooming, dressing, eating, or physical activity. SEEK MEDICAL CARE IF:   You are unable to eat food or drink fluids for more than 6 hours.  You have nausea and vomiting for more than 6 hours.  Your blood glucose level is over 240 mg/dL.  There is a change in mental status.  You develop an additional serious illness.  You have diarrhea for more than 6 hours.  You have been sick or have had a fever for a couple of days and are not getting better.  You have pain during any physical activity.  SEEK IMMEDIATE MEDICAL CARE IF:  You have difficulty breathing.  You have moderate to large ketone  levels.   This information is not intended to replace advice given to you by your health care provider. Make sure you discuss any questions you have with your health care provider.   Document Released: 08/09/2005 Document Revised: 04/30/2015 Document Reviewed: 03/07/2012 Elsevier Interactive Patient Education 2016 [employer].  Diabetes and Exercise Exercising regularly is important. It is not just about losing weight. It has many health benefits, such as:  Improving your overall fitness, flexibility, and endurance.  Increasing your bone density.  Helping with weight control.  Decreasing your body fat.  Increasing your muscle strength.  Reducing stress and tension.  Improving your overall health. People with diabetes who exercise gain additional benefits because exercise:  Reduces appetite.  Improves the body's use of blood sugar (glucose).  Helps lower or control blood glucose.  Decreases blood pressure.  Helps control blood lipids (such as  cholesterol and triglycerides).  Improves the body's use of the hormone insulin by:  Increasing the body's insulin sensitivity.  Reducing the body's insulin needs.  Decreases the risk for heart disease because exercising:  Lowers cholesterol and triglycerides levels.  Increases the levels of good cholesterol (such as high-density lipoproteins [HDL]) in the body.  Lowers blood glucose levels. YOUR ACTIVITY PLAN  Choose an activity that you enjoy, and set realistic goals. To exercise safely, you should begin practicing any new physical activity slowly, and gradually increase the intensity of the exercise over time. Your health care provider or diabetes educator can help create an activity plan that works for you. General recommendations include:  Encouraging children to engage in at least 60 minutes of physical activity each day.  Stretching and performing strength training exercises, such as yoga or weight lifting, at least 2  times per week.  Performing a total of at least 150 minutes of moderate-intensity exercise each week, such as brisk walking or water aerobics.  Exercising at least 3 days per week, making sure you allow no more than 2 consecutive days to pass without exercising.  Avoiding long periods of inactivity (90 minutes or more). When you have to spend an extended period of time sitting down, take frequent breaks to walk or stretch. RECOMMENDATIONS FOR EXERCISING WITH TYPE 1 OR TYPE 2 DIABETES   Check your blood glucose before exercising. If blood glucose levels are greater than 240 mg/dL, check for urine ketones. Do not exercise if ketones are present.  Avoid injecting insulin into areas of the body that are going to be exercised. For example, avoid injecting insulin into:  The arms when playing tennis.  The legs when jogging.  Keep a record of:  Food intake before and after you exercise.  Expected peak times of insulin action.  Blood glucose levels before and after you exercise.  The type and amount of exercise you have done.  Review your records with your health care provider. Your health care provider will help you to develop guidelines for adjusting food intake and insulin amounts before and after exercising.  If you take insulin or oral hypoglycemic agents, watch for signs and symptoms of hypoglycemia. They include:  Dizziness.  Shaking.  Sweating.  Chills.  Confusion.  Drink plenty of water while you exercise to prevent dehydration or heat stroke. Body water is lost during exercise and must be replaced.  Talk to your health care provider before starting an exercise program to make sure it is safe for you. Remember, almost any type of activity is better than none.   This information is not intended to replace advice given to you by your health care provider. Make sure you discuss any questions you have with your health care provider.   Document Released: 10/30/2003 Document  Revised: 12/24/2014 Document Reviewed: 01/16/2013 Elsevier Interactive Patient Education [employer].

## 2015-08-07 NOTE — Assessment & Plan Note (Signed)
Previous A1c consistent with diagnosis of Type 2 diabetes. Not currently maintained on medication. Obtain A1c, BMET and urine microalbumin. Encouraged to complete annual diabetic eye exam. She is currently on pravastatin for hyperlipidemia and CAD risk reduction. Medication pending A1c results. Discussed monitoring and potential for end organ damage with uncontrolled blood sugars. Follow up in 3 months.

## 2015-08-11 NOTE — Telephone Encounter (Signed)
Patient would like call back in regards.

## 2015-08-11 NOTE — Telephone Encounter (Signed)
LVM for pt to call back.

## 2015-08-12 MED ORDER — METFORMIN HCL 500 MG PO TABS: 500.0000 mg | ORAL_TABLET | Freq: Two times a day (BID) | ORAL | Status: DC

## 2015-08-12 NOTE — Telephone Encounter (Signed)
Patient called re results. Shared [name] notes. Patient states that [facility] on [address] states that they did not receive script. Please send metformin again. Patient is also asking for a direct answer for whether or not she is diabetic. Advised her that there is no information on whether she is or not. Please confirm to patient.

## 2015-08-12 NOTE — Telephone Encounter (Signed)
Called pt back to explain that she is diabetic. Resent metformin to her pharmacy.

## 2015-08-12 NOTE — Telephone Encounter (Signed)
Noted! Thank you

## 2015-08-20 DIAGNOSIS — E785 Hyperlipidemia, unspecified: Secondary | ICD-10-CM | POA: Diagnosis not present

## 2015-08-20 DIAGNOSIS — E119 Type 2 diabetes mellitus without complications: Secondary | ICD-10-CM | POA: Diagnosis not present

## 2015-08-20 DIAGNOSIS — Z6823 Body mass index (BMI) 23.0-23.9, adult: Secondary | ICD-10-CM | POA: Diagnosis not present

## 2015-09-04 DIAGNOSIS — H40003 Preglaucoma, unspecified, bilateral: Secondary | ICD-10-CM | POA: Diagnosis not present

## 2015-09-04 DIAGNOSIS — H40013 Open angle with borderline findings, low risk, bilateral: Secondary | ICD-10-CM | POA: Diagnosis not present

## 2015-09-24 ENCOUNTER — Encounter: Payer: Self-pay | Admitting: Nurse Practitioner

## 2015-09-24 ENCOUNTER — Ambulatory Visit (INDEPENDENT_AMBULATORY_CARE_PROVIDER_SITE_OTHER): Payer: Medicare Other | Admitting: Nurse Practitioner

## 2015-09-24 ENCOUNTER — Ambulatory Visit: Payer: Medicare Other | Admitting: Internal Medicine

## 2015-09-24 VITALS — BP 128/70 | HR 78 | Temp 97.5°F | Ht 63.0 in | Wt 130.2 lb

## 2015-09-24 DIAGNOSIS — M6248 Contracture of muscle, other site: Secondary | ICD-10-CM | POA: Diagnosis not present

## 2015-09-24 DIAGNOSIS — M62838 Other muscle spasm: Secondary | ICD-10-CM | POA: Insufficient documentation

## 2015-09-24 MED ORDER — TIZANIDINE HCL 4 MG PO TABS: 4.0000 mg | ORAL_TABLET | Freq: Every day | ORAL | Status: DC

## 2015-09-24 NOTE — Progress Notes (Signed)
Patient ID: [name], female    DOB: [birth year]  Age: 70 y.o. MRN: [id number]  CC: Sore Throat and Neck Pain   HPI [name] presents for CC of uri symptoms x 4 days.   1) Work up Sunday with sore throat that is slowly improving  Saturday woke up with stiff neck, biofreeze, icy hot, heat, ice packs lessens  Pain radiates to head for an occipital headache  Going to the pool as normal, but is not going this week  Laying on Right side helpful   History [name] has a past medical history of Coronary artery disease; Hyperlipidemia; History of rheumatic fever; Anemia; OSTEOPENIA; Insomnia; COLONIC POLYPS, HX OF; Hypothyroidism; Hypertension; Gastroesophageal reflux disease; PVD (peripheral vascular disease) ([place]) (10/31/2011); Vitamin D deficiency; RSD (reflex sympathetic dystrophy) (10/31/2011); PAD (peripheral artery disease) ([place]) (12/03/2011); COPD (chronic obstructive pulmonary disease) ([place]); Osteoarthritis; DISC DISEASE, CERVICAL; Arthritis; Heart murmur; and Anxiety.   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   Her family history includes Arthritis in her maternal aunt; Diabetes in her maternal aunt; Heart disease in her maternal aunt; Hypertension in her maternal aunt. There is no history of Colon cancer, Kidney disease, Liver disease, Throat cancer, or Stomach cancer.She reports that she quit smoking about 6 years ago. Her smoking use included Cigarettes. She has a 36 pack-year smoking history. She has never used smokeless tobacco. She reports that she does not drink alcohol or use illicit  drugs.  Outpatient Prescriptions Prior to Visit  Medication Sig Dispense Refill  . amLODipine (NORVASC) 10 MG tablet TAKE ONE TABLET BY MOUTH ONCE DAILY 90 tablet 3  . aspirin 325 MG tablet Take 325 mg by mouth daily.    . Calcium Carb-Cholecalciferol (CALCIUM 1000 + D PO) Take 1 tablet by mouth daily at 2 PM daily at 2 PM.     . co-enzyme Q-10 30 MG capsule Take 30 mg by mouth 3 (three) times daily.    Marland Kitchen levothyroxine (SYNTHROID, LEVOTHROID) 100 MCG tablet Take 1 tablet (100 mcg total) by mouth daily before breakfast. 30 tablet 3  . metFORMIN (GLUCOPHAGE) 500 MG tablet Take 1 tablet (500 mg total) by mouth 2 (two) times daily with a meal. 60 tablet 2  . Multiple Vitamin (MULTIVITAMIN WITH MINERALS) TABS tablet Take 1 tablet by mouth every morning. Centrum    . pantoprazole (PROTONIX) 40 MG tablet Take 1 tablet (40 mg total) by mouth 2 (two) times daily. 60 tablet 2  . Pravastatin Sodium (PRAVACHOL PO) Take by mouth.    Marland Kitchen PRENATAL VITAMINS PO Take 1 capsule by mouth every morning.     . methocarbamol (ROBAXIN) 500 MG tablet Take 1 tablet (500 mg total) by mouth every 6 (six) hours as needed for muscle spasms. 40 tablet 1  . hydrochlorothiazide (HYDRODIURIL) 25 MG tablet Take 25 mg by mouth daily after lunch. Reported on 09/24/2015    . Aspirin-Salicylamide-Caffeine (BC HEADACHE POWDER PO) Take 1 packet by mouth 3 (three) times daily as needed (headache). Reported on 09/24/2015    . meclizine (ANTIVERT) 25 MG tablet Take 1 tablet (25 mg total) by mouth 3 (three) times daily as needed for dizziness. (Patient not taking: Reported on 09/24/2015) 30 tablet 0  .  tiZANidine (ZANAFLEX) 4 MG tablet Take 1 tablet (4 mg total) by mouth every 6 (six) hours as needed for muscle spasms. (Patient not taking: Reported on 09/24/2015) 40 tablet 1   Facility-Administered Medications Prior to Visit  Medication Dose Route Frequency Provider Last Rate Last Dose  . tranexamic acid (CYKLOKAPRON) 2,000 mg in sodium chloride  0.9 % 50 mL Topical Application  4,709 mg Topical Once [employer], PA-C        ROS Review of Systems  Constitutional: Negative for fever, chills, diaphoresis and fatigue.  Respiratory: Negative for chest tightness, shortness of breath and wheezing.   Cardiovascular: Negative for chest pain, palpitations and leg swelling.  Musculoskeletal: Positive for myalgias. Negative for back pain, joint swelling, arthralgias, gait problem, neck pain and neck stiffness.  Skin: Negative for rash.  Neurological: Negative for dizziness, weakness, numbness and headaches.    Objective:  BP 128/70 mmHg  Pulse 78  Temp(Src) 97.5 F (36.4 C) (Oral)  Ht '5\' 3"'$  (1.6 m)  Wt 130 lb 4 oz (59.081 kg)  BMI 23.08 kg/m2  SpO2 97%  Physical Exam  Constitutional: She is oriented to person, place, and time. She appears well-developed and well-nourished. No distress.  HENT:  Head: Normocephalic and atraumatic.  Right Ear: External ear normal.  Left Ear: External ear normal.  Musculoskeletal: Normal range of motion. She exhibits tenderness. She exhibits no edema.  Left side trap spasm, right side supple and non-tender  Neurological: She is alert and oriented to person, place, and time. No cranial nerve deficit. She exhibits normal muscle tone. Coordination normal.  Skin: Skin is warm and dry. No rash noted. She is not diaphoretic.  Psychiatric: She has a normal mood and affect. Her behavior is normal. Judgment and thought content normal.   Assessment & Plan:   [name] was seen today for sore throat and neck pain.  Diagnoses and all orders for this visit:  Trapezius muscle spasm  Other orders -     tiZANidine (ZANAFLEX) 4 MG tablet; Take 1 tablet (4 mg total) by mouth at bedtime.  I have discontinued [name]'s Aspirin-Salicylamide-Caffeine (BC HEADACHE POWDER PO), methocarbamol, and meclizine. I have also changed her tiZANidine. Additionally, I am having her maintain her Calcium Carb-Cholecalciferol  (CALCIUM 1000 + D PO), hydrochlorothiazide, PRENATAL VITAMINS PO, multivitamin with minerals, aspirin, levothyroxine, amLODipine, pantoprazole, co-enzyme Q-10, Pravastatin Sodium (PRAVACHOL PO), and metFORMIN.  Meds ordered this encounter  Medications  . tiZANidine (ZANAFLEX) 4 MG tablet    Sig: Take 1 tablet (4 mg total) by mouth at bedtime.    Dispense:  30 tablet    Refill:  0    Order Specific Question:  Supervising Provider    Answer:  Crecencio Mc [[id number]]     Follow-up: Return if symptoms worsen or fail to improve.

## 2015-09-24 NOTE — Assessment & Plan Note (Signed)
New onset Tizanidine sent back into pharmacy with instructions on how to take and cautioned about dizzy effects.  Continue OTC measures.

## 2015-09-24 NOTE — Patient Instructions (Signed)
Tizanidine at night time for a muscle relaxer, stretch, heat and follow up with your doctor if not improved.

## 2015-09-24 NOTE — Progress Notes (Signed)
Pre visit review using our clinic review tool, if applicable. No additional management support is needed unless otherwise documented below in the visit note. 

## 2015-10-08 ENCOUNTER — Ambulatory Visit: Payer: Medicare Other | Admitting: Internal Medicine

## 2015-10-15 ENCOUNTER — Other Ambulatory Visit (INDEPENDENT_AMBULATORY_CARE_PROVIDER_SITE_OTHER): Payer: Medicare Other

## 2015-10-15 ENCOUNTER — Encounter: Payer: Self-pay | Admitting: Internal Medicine

## 2015-10-15 ENCOUNTER — Ambulatory Visit (INDEPENDENT_AMBULATORY_CARE_PROVIDER_SITE_OTHER): Payer: Medicare Other | Admitting: Internal Medicine

## 2015-10-15 VITALS — BP 128/78 | HR 83 | Temp 97.9°F | Resp 20 | Wt 129.0 lb

## 2015-10-15 DIAGNOSIS — I10 Essential (primary) hypertension: Secondary | ICD-10-CM | POA: Diagnosis not present

## 2015-10-15 DIAGNOSIS — M6248 Contracture of muscle, other site: Secondary | ICD-10-CM

## 2015-10-15 DIAGNOSIS — M25512 Pain in left shoulder: Secondary | ICD-10-CM

## 2015-10-15 DIAGNOSIS — E119 Type 2 diabetes mellitus without complications: Secondary | ICD-10-CM

## 2015-10-15 DIAGNOSIS — M62838 Other muscle spasm: Secondary | ICD-10-CM

## 2015-10-15 DIAGNOSIS — Z0000 Encounter for general adult medical examination without abnormal findings: Secondary | ICD-10-CM | POA: Diagnosis not present

## 2015-10-15 LAB — HEPATIC FUNCTION PANEL
ALT: 13 U/L (ref 0–35)
AST: 17 U/L (ref 0–37)
Albumin: 4.3 g/dL (ref 3.5–5.2)
Alkaline Phosphatase: 100 U/L (ref 39–117)
Bilirubin, Direct: 0.2 mg/dL (ref 0.0–0.3)
Total Bilirubin: 0.7 mg/dL (ref 0.2–1.2)
Total Protein: 7.3 g/dL (ref 6.0–8.3)

## 2015-10-15 LAB — LIPID PANEL
Cholesterol: 179 mg/dL (ref 0–200)
HDL: 72.7 mg/dL (ref >39.00)
LDL Cholesterol: 85 mg/dL (ref 0–99)
NonHDL: 106.19
Total CHOL/HDL Ratio: 2
Triglycerides: 105 mg/dL (ref 0.0–149.0)
VLDL: 21 mg/dL (ref 0.0–40.0)

## 2015-10-15 LAB — CBC WITH DIFFERENTIAL/PLATELET
Basophils Absolute: 0 K/uL (ref 0.0–0.1)
Basophils Relative: 0.6 % (ref 0.0–3.0)
Eosinophils Absolute: 0.2 K/uL (ref 0.0–0.7)
Eosinophils Relative: 3.3 % (ref 0.0–5.0)
HCT: 42.8 % (ref 36.0–46.0)
Hemoglobin: 14.6 g/dL (ref 12.0–15.0)
Lymphocytes Relative: 33.8 % (ref 12.0–46.0)
Lymphs Abs: 2.5 K/uL (ref 0.7–4.0)
MCHC: 34.1 g/dL (ref 30.0–36.0)
MCV: 86.5 fl (ref 78.0–100.0)
Monocytes Absolute: 0.4 K/uL (ref 0.1–1.0)
Monocytes Relative: 5.5 % (ref 3.0–12.0)
Neutro Abs: 4.2 K/uL (ref 1.4–7.7)
Neutrophils Relative %: 56.8 % (ref 43.0–77.0)
Platelets: 200 K/uL (ref 150.0–400.0)
RBC: 4.95 Mil/uL (ref 3.87–5.11)
RDW: 15.8 % — ABNORMAL HIGH (ref 11.5–15.5)
WBC: 7.4 K/uL (ref 4.0–10.5)

## 2015-10-15 LAB — URINALYSIS, ROUTINE W REFLEX MICROSCOPIC
Bilirubin Urine: NEGATIVE
Ketones, ur: NEGATIVE
Leukocytes, UA: NEGATIVE
Nitrite: NEGATIVE
Specific Gravity, Urine: 1.01 (ref 1.000–1.030)
Total Protein, Urine: 30 — AB
Urine Glucose: NEGATIVE
Urobilinogen, UA: 0.2 (ref 0.0–1.0)
pH: 6 (ref 5.0–8.0)

## 2015-10-15 LAB — BASIC METABOLIC PANEL WITH GFR
BUN: 12 mg/dL (ref 6–23)
CO2: 27 meq/L (ref 19–32)
Calcium: 9.4 mg/dL (ref 8.4–10.5)
Chloride: 105 meq/L (ref 96–112)
Creatinine, Ser: 0.63 mg/dL (ref 0.40–1.20)
GFR: 119.67 mL/min (ref >60.00)
Glucose, Bld: 97 mg/dL (ref 70–99)
Potassium: 3.5 meq/L (ref 3.5–5.1)
Sodium: 141 meq/L (ref 135–145)

## 2015-10-15 LAB — HEMOGLOBIN A1C: Hgb A1c MFr Bld: 6.5 % (ref 4.6–6.5)

## 2015-10-15 LAB — MICROALBUMIN / CREATININE URINE RATIO
Creatinine,U: 168 mg/dL
Microalb Creat Ratio: 9.3 mg/g (ref 0.0–30.0)
Microalb, Ur: 15.7 mg/dL — ABNORMAL HIGH (ref 0.0–1.9)

## 2015-10-15 LAB — TSH: TSH: 27 u[IU]/mL — ABNORMAL HIGH (ref 0.35–4.50)

## 2015-10-15 LAB — HEPATITIS C ANTIBODY: HCV Ab: NEGATIVE

## 2015-10-15 NOTE — Assessment & Plan Note (Signed)
With mild tender, d/w pt - declines other muscel relaxer fo rnow

## 2015-10-15 NOTE — Progress Notes (Signed)
Subjective:    Patient ID: [name], female    DOB: February 13, 1946, 70 y.o.   MRN: [id number]  HPI  Here for wellness and f/u;  Overall doing ok;  Pt denies Chest pain, worsening SOB, DOE, wheezing, orthopnea, PND, worsening LE edema, palpitations, dizziness or syncope.  Pt denies neurological change such as new headache, facial or extremity weakness.  Pt denies polydipsia, polyuria, or low sugar symptoms. Pt states overall good compliance with treatment and medications, good tolerability, and has been trying to follow appropriate diet.  Pt denies worsening depressive symptoms, suicidal ideation or panic. No fever, night sweats, wt loss, loss of appetite, or other constitutional symptoms.  Pt states good ability with ADL's, has low fall risk, home safety reviewed and adequate, no other significant changes in hearing or vision, and only occasionally active with exercise, per pt due to prior right knee replacement.   Unfort tizanidine causing sedation, so stopped.  Still with left shoulder pain, worse to abduct and forward elevated, has some minor left neck pain but much worse to move the shoulder itself.  Also mentions has persistent trace to 1+ edema to RLE only after after right knee TKR.   Pt denies fever, wt loss, night sweats, loss of appetite, or other constitutional symptoms Past Medical History  Diagnosis Date  . Coronary artery disease   . Hyperlipidemia   . History of rheumatic fever   . Anemia   . OSTEOPENIA   . Insomnia   . COLONIC POLYPS, HX OF   . Hypothyroidism   . Hypertension   . Gastroesophageal reflux disease   . PVD (peripheral vascular disease) ([place]) 10/31/2011  . Vitamin D deficiency   . RSD (reflex sympathetic dystrophy) 10/31/2011  . PAD (peripheral artery disease) ([place]) 12/03/2011    status post right SFA Turbo Hawk atherectomy  . COPD (chronic obstructive pulmonary disease) ([place])     no per pt on 03/21/2014  . Osteoarthritis   . DISC DISEASE, CERVICAL   .  Arthritis     "my whole body"  . Heart murmur     as a child  . Anxiety     hx   Past Surgical History  Procedure Laterality Date  . Abdominal hysterectomy  1977    "partial"  . Thyroidectomy  1995  . Tonsillectomy    . Carpal tunnel release Right 2005  . Cataract extraction Left 2013  . Balloon angioplasty, artery Right 03/21/2014    SFA  . Dilation and curettage of uterus  1978  . Coronary artery bypass graft  10/2009    LIMA to the LAD, saphenous vein graft to the acute marginal, saphenous vein graft to the diagonal and obtuse marginal.  . Cardiac catheterization  2011   . Total knee arthroplasty Right 06/18/2014    Procedure: RIGHT TOTAL KNEE ARTHROPLASTY;  Surgeon: Tobi Bastos, MD;  Location: [facility];  Service: Orthopedics;  Laterality: Right;  . Lower extremity angiogram N/A 03/21/2014    Procedure: LOWER EXTREMITY ANGIOGRAM;  Surgeon: Lorretta Harp, MD;  Location: [facility] CATH LAB;  Service: Cardiovascular;  Laterality: N/A;    reports that she quit smoking about 6 years ago. Her smoking use included Cigarettes. She has a 36 pack-year smoking history. She has never used smokeless tobacco. She reports that she does not drink alcohol or use illicit drugs. family history includes Arthritis in her maternal aunt; Diabetes in her maternal aunt; Heart disease in her maternal aunt; Hypertension in her maternal  aunt. There is no history of Colon cancer, Kidney disease, Liver disease, Throat cancer, or Stomach cancer. Allergies  Allergen Reactions  . Lipitor [Atorvastatin] Other (See Comments)    Muscle aches   Current Outpatient Prescriptions on File Prior to Visit  Medication Sig Dispense Refill  . amLODipine (NORVASC) 10 MG tablet TAKE ONE TABLET BY MOUTH ONCE DAILY 90 tablet 3  . aspirin 325 MG tablet Take 325 mg by mouth daily.    . Calcium Carb-Cholecalciferol (CALCIUM 1000 + D PO) Take 1 tablet by mouth daily at 2 PM daily at 2 PM.     . co-enzyme Q-10 30 MG capsule Take 30  mg by mouth 3 (three) times daily.    . hydrochlorothiazide (HYDRODIURIL) 25 MG tablet Take 25 mg by mouth daily after lunch. Reported on 09/24/2015    . levothyroxine (SYNTHROID, LEVOTHROID) 100 MCG tablet Take 1 tablet (100 mcg total) by mouth daily before breakfast. 30 tablet 3  . metFORMIN (GLUCOPHAGE) 500 MG tablet Take 1 tablet (500 mg total) by mouth 2 (two) times daily with a meal. 60 tablet 2  . Multiple Vitamin (MULTIVITAMIN WITH MINERALS) TABS tablet Take 1 tablet by mouth every morning. Centrum    . pantoprazole (PROTONIX) 40 MG tablet Take 1 tablet (40 mg total) by mouth 2 (two) times daily. 60 tablet 2  . Pravastatin Sodium (PRAVACHOL PO) Take by mouth.    Marland Kitchen PRENATAL VITAMINS PO Take 1 capsule by mouth every morning.     Marland Kitchen tiZANidine (ZANAFLEX) 4 MG tablet Take 1 tablet (4 mg total) by mouth at bedtime. 30 tablet 0   Current Facility-Administered Medications on File Prior to Visit  Medication Dose Route Frequency Provider Last Rate Last Dose  . tranexamic acid (CYKLOKAPRON) 2,000 mg in sodium chloride 0.9 % 50 mL Topical Application  4,665 mg Topical Once [employer], PA-C         Review of Systems Constitutional: Negative for increased diaphoresis, other activity, appetite or siginficant weight change other than noted HENT: Negative for worsening hearing loss, ear pain, facial swelling, mouth sores and neck stiffness.   Eyes: Negative for other worsening pain, redness or visual disturbance.  Respiratory: Negative for shortness of breath and wheezing  Cardiovascular: Negative for chest pain and palpitations.  Gastrointestinal: Negative for diarrhea, blood in stool, abdominal distention or other pain Genitourinary: Negative for hematuria, flank pain or change in urine volume.  Musculoskeletal: Negative for myalgias or other joint complaints.  Skin: Negative for color change and wound or drainage.  Neurological: Negative for syncope and numbness. other than  noted Hematological: Negative for adenopathy. or other swelling Psychiatric/Behavioral: Negative for hallucinations, SI, self-injury, decreased concentration or other worsening agitation.      Objective:   Physical Exam BP 128/78 mmHg  Pulse 83  Temp(Src) 97.9 F (36.6 C) (Oral)  Resp 20  Wt 129 lb (58.514 kg)  SpO2 96% VS noted, not ill appearing Constitutional: Pt is oriented to person, place, and time. Appears well-developed and well-nourished, in no significant distress Head: Normocephalic and atraumatic.  Right Ear: External ear normal.  Left Ear: External ear normal.  Nose: Nose normal.  Mouth/Throat: Oropharynx is clear and moist.  Eyes: Conjunctivae and EOM are normal. Pupils are equal, round, and reactive to light.  Neck: Normal range of motion. Neck supple. No JVD present. No tracheal deviation present or significant neck LA or mass Cardiovascular: Normal rate, regular rhythm, normal heart sounds and intact distal pulses.   Pulmonary/Chest: Effort  normal and breath sounds without rales or wheezing  Abdominal: Soft. Bowel sounds are normal. NT. No HSM  Musculoskeletal: Normal range of motion. Exhibits no edema.  Lymphadenopathy:  Has no cervical adenopathy.  Neurological: Pt is alert and oriented to person, place, and time. Pt has normal reflexes. No cranial nerve deficit. Motor grossly intact, sens/dtr intact, gait intact Left shoulder with mild tender to palpation post deltoid and trapezius areas,  Skin: Skin is warm and dry. No rash noted. trace RLE edema s/p right knee TKR, abduct to 90 degrees only Psychiatric:  Has mild irritable mood and affect, much verbosity. Behavior is normal.     Assessment & Plan:

## 2015-10-15 NOTE — Progress Notes (Signed)
Pre visit review using our clinic review tool, if applicable. No additional management support is needed unless otherwise documented below in the visit note. 

## 2015-10-15 NOTE — Patient Instructions (Signed)
OK to stop the tizanidine as you have  Please continue all other medications as before, and refills have been done if requested.  Please have the pharmacy call with any other refills you may need.  Please continue your efforts at being more active, low cholesterol diet, and weight control.  You are otherwise up to date with prevention measures today.  Please keep your appointments with your specialists as you may have planned  You will be contacted regarding the referral for: Dr Tamala Julian, Sports Medicine in this office  Please go to the LAB in the Basement (turn left off the elevator) for the tests to be done today  You will be contacted by phone if any changes need to be made immediately.  Otherwise, you will receive a letter about your results with an explanation, but please check with MyChart first.  Please remember to sign up for MyChart if you have not done so, as this will be important to you in the future with finding out test results, communicating by private email, and scheduling acute appointments online when needed.  Please return in 6 months, or sooner if needed, with Lab testing done 3-5 days before

## 2015-10-15 NOTE — Assessment & Plan Note (Signed)
?   MSK strain vs other such as rot cuff issue, for referral sport medicine ,  to f/u any worsening symptoms or concerns, to d/c the tizanidine

## 2015-10-15 NOTE — Assessment & Plan Note (Signed)

## 2015-10-15 NOTE — Assessment & Plan Note (Signed)
stable overall by history and exam, recent data reviewed with pt, and pt to continue medical treatment as before,  to f/u any worsening symptoms or concerns BP Readings from Last 3 Encounters:  10/15/15 128/78  09/24/15 128/70  08/07/15 152/86

## 2015-10-15 NOTE — Assessment & Plan Note (Signed)
stable overall by history and exam, recent data reviewed with pt, and pt to continue medical treatment as before,  to f/u any worsening symptoms or concerns Lab Results  Component Value Date   HGBA1C 6.9* 08/07/2015    for f/u lab on new medication

## 2015-10-16 ENCOUNTER — Other Ambulatory Visit: Payer: Self-pay | Admitting: Internal Medicine

## 2015-10-16 ENCOUNTER — Other Ambulatory Visit: Payer: Self-pay | Admitting: *Deleted

## 2015-10-16 DIAGNOSIS — R918 Other nonspecific abnormal finding of lung field: Secondary | ICD-10-CM

## 2015-10-16 DIAGNOSIS — R3129 Other microscopic hematuria: Secondary | ICD-10-CM

## 2015-10-16 MED ORDER — LEVOTHYROXINE SODIUM 150 MCG PO TABS: 150.0000 ug | ORAL_TABLET | Freq: Every day | ORAL | Status: DC

## 2015-10-17 ENCOUNTER — Encounter: Payer: Self-pay | Admitting: Internal Medicine

## 2015-10-24 ENCOUNTER — Ambulatory Visit: Payer: Medicare Other | Admitting: Family

## 2015-10-27 ENCOUNTER — Ambulatory Visit: Payer: Medicare Other | Admitting: Family

## 2015-10-28 ENCOUNTER — Other Ambulatory Visit: Payer: Medicare Other

## 2015-10-28 ENCOUNTER — Encounter: Payer: Medicare Other | Admitting: Thoracic Surgery (Cardiothoracic Vascular Surgery)

## 2015-10-28 IMAGING — CR DG KNEE 1-2V*L*
2 series · 2 of 2 positions shown · non-contrast
Comparison: 07/09/2010

CLINICAL DATA: Knee pain.

EXAM:
LEFT KNEE - 1-2 VIEW

[AP]
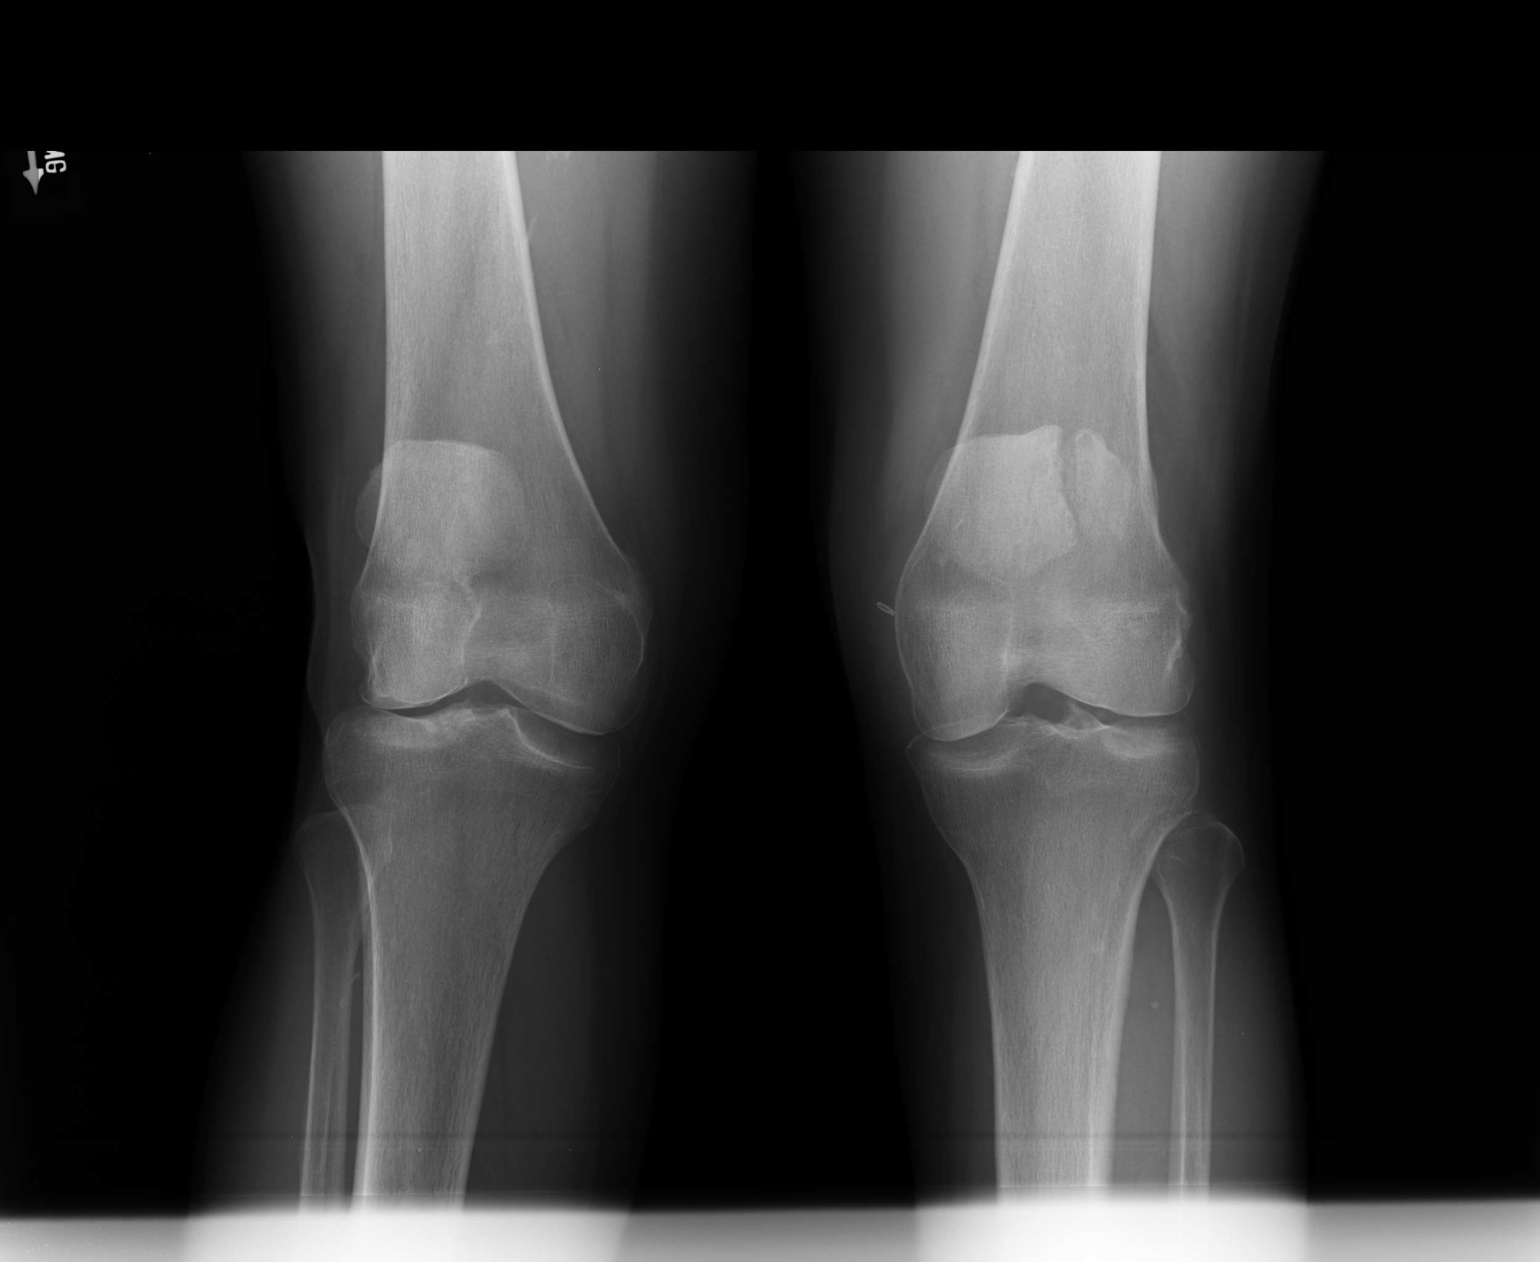

[lateral]
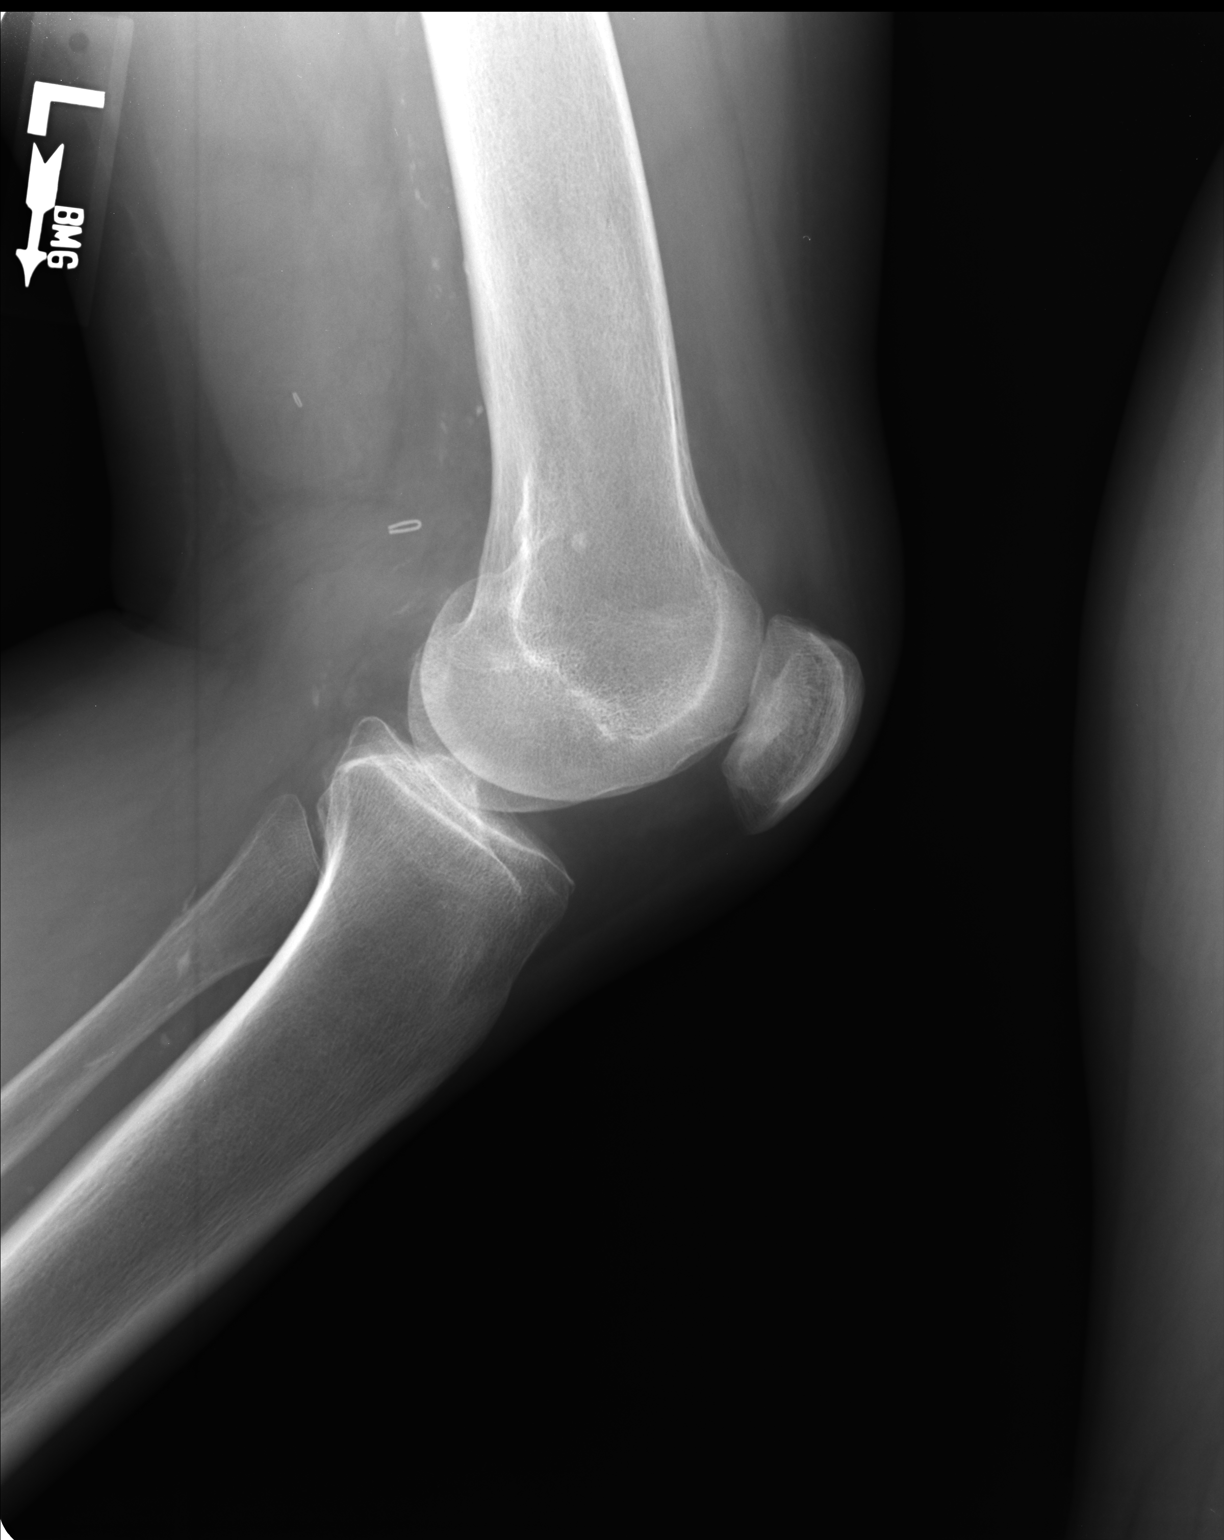

[2 of 2 positions shown; findings below may reference images not displayed]

FINDINGS: Again noted is a bipartite patella. Lateral compartment
chondrocalcinosis is again seen, better demonstrated on the prior
radiographs due to slight differences in projection. A small knee
joint effusion is questioned. There is no evidence of acute fracture
or dislocation. Atherosclerotic calcifications and a surgical clip
are noted.
IMPRESSION: 1. Possible small left knee joint effusion.
2. Lateral compartment chondrocalcinosis.

## 2015-10-29 ENCOUNTER — Ambulatory Visit
Admission: RE | Admit: 2015-10-29 | Discharge: 2015-10-29 | Disposition: A | Discharge status: Home / Self Care | Payer: Medicare Other | Source: Ambulatory Visit | Attending: Thoracic Surgery (Cardiothoracic Vascular Surgery) | Admitting: Thoracic Surgery (Cardiothoracic Vascular Surgery)

## 2015-10-29 DIAGNOSIS — R0989 Other specified symptoms and signs involving the circulatory and respiratory systems: Secondary | ICD-10-CM | POA: Diagnosis not present

## 2015-10-29 DIAGNOSIS — R3121 Asymptomatic microscopic hematuria: Secondary | ICD-10-CM | POA: Diagnosis not present

## 2015-10-29 DIAGNOSIS — K209 Esophagitis, unspecified: Secondary | ICD-10-CM | POA: Diagnosis not present

## 2015-10-29 DIAGNOSIS — R918 Other nonspecific abnormal finding of lung field: Secondary | ICD-10-CM | POA: Diagnosis not present

## 2015-10-29 DIAGNOSIS — E119 Type 2 diabetes mellitus without complications: Secondary | ICD-10-CM | POA: Diagnosis not present

## 2015-10-29 DIAGNOSIS — Z1389 Encounter for screening for other disorder: Secondary | ICD-10-CM | POA: Diagnosis not present

## 2015-10-29 DIAGNOSIS — E559 Vitamin D deficiency, unspecified: Secondary | ICD-10-CM | POA: Diagnosis not present

## 2015-10-31 ENCOUNTER — Ambulatory Visit: Payer: Medicare Other | Admitting: Family

## 2015-10-31 ENCOUNTER — Other Ambulatory Visit (HOSPITAL_COMMUNITY): Payer: Self-pay | Admitting: Endocrinology

## 2015-10-31 ENCOUNTER — Ambulatory Visit (HOSPITAL_COMMUNITY)
Admission: RE | Admit: 2015-10-31 | Discharge: 2015-10-31 | Disposition: A | Discharge status: Home / Self Care | Payer: Medicare Other | Source: Ambulatory Visit | Attending: Vascular Surgery | Admitting: Vascular Surgery

## 2015-10-31 DIAGNOSIS — R0989 Other specified symptoms and signs involving the circulatory and respiratory systems: Secondary | ICD-10-CM | POA: Diagnosis not present

## 2015-10-31 DIAGNOSIS — I10 Essential (primary) hypertension: Secondary | ICD-10-CM | POA: Diagnosis not present

## 2015-10-31 DIAGNOSIS — I6522 Occlusion and stenosis of left carotid artery: Secondary | ICD-10-CM | POA: Diagnosis not present

## 2015-10-31 DIAGNOSIS — I779 Disorder of arteries and arterioles, unspecified: Secondary | ICD-10-CM | POA: Diagnosis not present

## 2015-10-31 DIAGNOSIS — E785 Hyperlipidemia, unspecified: Secondary | ICD-10-CM | POA: Insufficient documentation

## 2015-11-04 ENCOUNTER — Encounter: Payer: Self-pay | Admitting: Thoracic Surgery (Cardiothoracic Vascular Surgery)

## 2015-11-04 ENCOUNTER — Ambulatory Visit (INDEPENDENT_AMBULATORY_CARE_PROVIDER_SITE_OTHER): Payer: Medicare Other | Admitting: Thoracic Surgery (Cardiothoracic Vascular Surgery)

## 2015-11-04 DIAGNOSIS — R918 Other nonspecific abnormal finding of lung field: Secondary | ICD-10-CM

## 2015-11-04 NOTE — Progress Notes (Signed)
[address]       [place] [address]             [phone]       HPI: [name] returns today for follow-up regarding multiple lung nodules.  She is a 70 year old woman with a 45-pack-year history of tobacco abuse. She quit smoking after her coronary bypass surgery in 2011.   In 2015 she was being evaluated for left arm pain and numbness. A scan of her neck showed cervical spine disease. A questionable right apical mass was noted also. A CT of the chest showed bilateral apical parenchymal densities. The one on the right had enlarged since her last scan. She had a PET/CT, which showed no convincing hypermetabolism relative to the opposite side. It was felt to likely be benign, but I recommended that we follow to be certain.  She says overall she feels better since her Synthroid dose was increased 250 mg daily. She does complain of pain in her left leg and also continues to have pain in the left side of her neck. She tried Flexeril for the neck pain but it did not provide any relief, it only made her sleepy. She denies any recent problems with her breathing.  Past Medical History  Diagnosis Date  . Coronary artery disease   . Hyperlipidemia   . History of rheumatic fever   . Anemia   . OSTEOPENIA   . Insomnia   . COLONIC POLYPS, HX OF   . Hypothyroidism   . Hypertension   . Gastroesophageal reflux disease   . PVD (peripheral vascular disease) ([place]) 10/31/2011  . Vitamin D deficiency   . RSD (reflex sympathetic dystrophy) 10/31/2011  . PAD (peripheral artery disease) ([place]) 12/03/2011    status post right SFA Turbo Hawk atherectomy  . COPD (chronic obstructive pulmonary disease) ([place])     no per pt on 03/21/2014  . Osteoarthritis   . DISC DISEASE, CERVICAL   . Arthritis     "my whole body"  . Heart murmur     as a child  . Anxiety     hx        Current Outpatient Prescriptions  Medication Sig Dispense Refill  . amLODipine (NORVASC) 10 MG tablet  TAKE ONE TABLET BY MOUTH ONCE DAILY 90 tablet 3  . aspirin 325 MG tablet Take 325 mg by mouth daily.    . Calcium Carb-Cholecalciferol (CALCIUM 1000 + D PO) Take 1 tablet by mouth daily at 2 PM daily at 2 PM.     . co-enzyme Q-10 30 MG capsule Take 30 mg by mouth 3 (three) times daily.    . hydrochlorothiazide (HYDRODIURIL) 25 MG tablet Take 25 mg by mouth daily after lunch. Reported on 09/24/2015    . levothyroxine (SYNTHROID, LEVOTHROID) 150 MCG tablet Take 1 tablet (150 mcg total) by mouth daily. 90 tablet 3  . metFORMIN (GLUCOPHAGE) 500 MG tablet Take 500 mg by mouth daily with breakfast.    . Multiple Vitamin (MULTIVITAMIN WITH MINERALS) TABS tablet Take 1 tablet by mouth every morning. Centrum    . pantoprazole (PROTONIX) 40 MG tablet Take 1 tablet (40 mg total) by mouth 2 (two) times daily. 60 tablet 2  . Pravastatin Sodium (PRAVACHOL PO) Take by mouth.    Marland Kitchen PRENATAL VITAMINS PO Take 1 capsule by mouth every morning.     Marland Kitchen tiZANidine (ZANAFLEX) 4 MG tablet Take 1 tablet (4 mg total) by mouth at bedtime.  30 tablet 0   No current facility-administered medications for this visit.   Facility-Administered Medications Ordered in Other Visits  Medication Dose Route Frequency Provider Last Rate Last Dose  . tranexamic acid (CYKLOKAPRON) 2,000 mg in sodium chloride 0.9 % 50 mL Topical Application  0,156 mg Topical Once [employer], PA-C        Physical Exam BP 124/76 mmHg  Pulse 97  Resp 16  Ht '5\' 1"'$  (1.549 m)  Wt 114 lb (51.71 kg)  BMI 21.55 kg/m2  SpO21 31% 70 year old woman in no acute distress Alert and oriented 3 No cervical or subclavicular adenopathy Lungs diminished but equal bilaterally, no wheezing Cardiac regular rate and rhythm normal S1 and S2  Diagnostic Tests: CT CHEST WITHOUT CONTRAST  TECHNIQUE: Multidetector CT imaging of the chest was performed following the standard protocol without IV contrast.  COMPARISON: 04/29/2015 on lung 01/29/2014,  11/05/2009  FINDINGS: Mediastinum/Nodes: Prior CABG. Heart is normal size. Aorta is calcified, non aneurysmal. No mediastinal, hilar, or axillary adenopathy. Small hiatal hernia.  Lungs/Pleura: Biapical scarring. Mild to moderate emphysematous changes. Small bilateral pulmonary nodules are again noted and unchanged dating back to 2011. No new or enlarging pulmonary nodules. No confluent airspace opacities or effusions.  Upper abdomen: Imaging into the upper abdomen shows no acute findings.  Musculoskeletal: Chest wall soft tissues are unremarkable. No acute bony abnormality.  IMPRESSION: Small scattered bilateral pulmonary nodules are stable dating back to 2011, compatible with benign nodules.  Emphysema, biapical scarring, stable.  Prior CABG.  No acute findings.   Electronically Signed  By: Rolm Baptise M.D.  On: 10/29/2015 12:22  I personally reviewed the CT chest and concur with findings noted above  Impression: [name] is a 70 year old woman with a history of tobacco abuse who has multiple small pulmonary nodules as well as biapical scarring. Those changes are stable on CT over the past 2 years. There is no need for continued follow-up of those areas.  However, she does have a smoking history of greater than 30 pack years and quit 6 years ago. She does meet the criteria for low-dose CT screening and I think she should have that done. I discussed that with her and she is interested.  Plan: Return in 1 year with low-dose screening CT  I spent 10 minutes with [name] during this visit, greater than 50% spent in counseling.  Melrose Nakayama, MD [facility] [phone]

## 2015-11-07 DIAGNOSIS — N390 Urinary tract infection, site not specified: Secondary | ICD-10-CM | POA: Diagnosis not present

## 2015-11-07 DIAGNOSIS — R3129 Other microscopic hematuria: Secondary | ICD-10-CM | POA: Diagnosis not present

## 2015-11-12 ENCOUNTER — Ambulatory Visit (INDEPENDENT_AMBULATORY_CARE_PROVIDER_SITE_OTHER): Payer: Medicare Other | Admitting: Internal Medicine

## 2015-11-12 VITALS — BP 136/80 | HR 81 | Temp 98.4°F | Resp 20 | Wt 124.0 lb

## 2015-11-12 DIAGNOSIS — I10 Essential (primary) hypertension: Secondary | ICD-10-CM

## 2015-11-12 DIAGNOSIS — E119 Type 2 diabetes mellitus without complications: Secondary | ICD-10-CM | POA: Diagnosis not present

## 2015-11-12 DIAGNOSIS — R3129 Other microscopic hematuria: Secondary | ICD-10-CM | POA: Diagnosis not present

## 2015-11-12 DIAGNOSIS — E034 Atrophy of thyroid (acquired): Secondary | ICD-10-CM

## 2015-11-12 DIAGNOSIS — E038 Other specified hypothyroidism: Secondary | ICD-10-CM | POA: Diagnosis not present

## 2015-11-12 NOTE — Progress Notes (Signed)
Pre visit review using our clinic review tool, if applicable. No additional management support is needed unless otherwise documented below in the visit note. 

## 2015-11-12 NOTE — Assessment & Plan Note (Signed)
stable overall by history and exam, recent data reviewed with pt, and pt to continue medical treatment as before,  to f/u any worsening symptoms or concerns Lab Results  Component Value Date   HGBA1C 6.5 10/15/2015

## 2015-11-12 NOTE — Patient Instructions (Signed)
Please continue all other medications as before, and refills have been done if requested.  Please have the pharmacy call with any other refills you may need.  Please continue your efforts at being more active, low cholesterol diet, and weight control.  You are otherwise up to date with prevention measures today.  Please keep your appointments with your specialists as you may have planned   

## 2015-11-12 NOTE — Assessment & Plan Note (Signed)
With recent increased thyroid med due to large increased TSH, pt requests f/u overall with endo for now, declines f/u lab, is somewhat irritable today, but o/w stable overall by history and exam, recent data reviewed with pt, and pt to continue medical treatment as before,  to f/u any worsening symptoms or concerns Lab Results  Component Value Date   TSH 27.00* 10/15/2015

## 2015-11-12 NOTE — Assessment & Plan Note (Signed)
stable overall by history and exam, recent data reviewed with pt, and pt to continue medical treatment as before,  to f/u any worsening symptoms or concerns BP Readings from Last 3 Encounters:  11/12/15 136/80  11/04/15 124/76  10/15/15 128/78

## 2015-11-12 NOTE — Progress Notes (Signed)
Subjective:    Patient ID: [name], female    DOB: Sep 15, 1945, 70 y.o.   MRN: [id number]  HPI  Here to f/u, pt believes she may be having side effect from meds but not sure, seems to believe the metformin has led to blood in the , maybe making bowel issues worse, making her left leg pain worse since did not have that pain before the metformin, and even making her DM worse after the increase to 1000 mg; does accepts the BP pills, asa and thyroid med, statin, but just no longer accepts the metformin, also believes her other meds have to be staggered at different times during the day so they dont "combat each other".  Also sees dr Forde Dandy and has not d/w him these concerns yet, though did have lab work there since last visit here, with recent a1c 6.2, Pt reqeusts f/u overall for DM and thyroid for continuity, which is certainly reasonable. Denies urinary symptoms such as dysuria, frequency, urgency, flank pain, hematuria or n/v, fever, chills, not yet seen per urology Past Medical History  Diagnosis Date  . Coronary artery disease   . Hyperlipidemia   . History of rheumatic fever   . Anemia   . OSTEOPENIA   . Insomnia   . COLONIC POLYPS, HX OF   . Hypothyroidism   . Hypertension   . Gastroesophageal reflux disease   . PVD (peripheral vascular disease) ([place]) 10/31/2011  . Vitamin D deficiency   . RSD (reflex sympathetic dystrophy) 10/31/2011  . PAD (peripheral artery disease) ([place]) 12/03/2011    status post right SFA Turbo Hawk atherectomy  . COPD (chronic obstructive pulmonary disease) ([place])     no per pt on 03/21/2014  . Osteoarthritis   . DISC DISEASE, CERVICAL   . Arthritis     "my whole body"  . Heart murmur     as a child  . Anxiety     hx   Past Surgical History  Procedure Laterality Date  . Abdominal hysterectomy  1977    "partial"  . Thyroidectomy  1995  . Tonsillectomy    . Carpal tunnel release Right 2005  . Cataract extraction Left 2013  . Balloon angioplasty,  artery Right 03/21/2014    SFA  . Dilation and curettage of uterus  1978  . Coronary artery bypass graft  10/2009    LIMA to the LAD, saphenous vein graft to the acute marginal, saphenous vein graft to the diagonal and obtuse marginal.  . Cardiac catheterization  2011   . Total knee arthroplasty Right 06/18/2014    Procedure: RIGHT TOTAL KNEE ARTHROPLASTY;  Surgeon: Tobi Bastos, MD;  Location: [facility];  Service: Orthopedics;  Laterality: Right;  . Lower extremity angiogram N/A 03/21/2014    Procedure: LOWER EXTREMITY ANGIOGRAM;  Surgeon: Lorretta Harp, MD;  Location: [facility] CATH LAB;  Service: Cardiovascular;  Laterality: N/A;    reports that she quit smoking about 6 years ago. Her smoking use included Cigarettes. She has a 36 pack-year smoking history. She has never used smokeless tobacco. She reports that she does not drink alcohol or use illicit drugs. family history includes Arthritis in her maternal aunt; Diabetes in her maternal aunt; Heart disease in her maternal aunt; Hypertension in her maternal aunt. There is no history of Colon cancer, Kidney disease, Liver disease, Throat cancer, or Stomach cancer. Allergies  Allergen Reactions  . Lipitor [Atorvastatin] Other (See Comments)    Muscle aches   Current Outpatient  Prescriptions on File Prior to Visit  Medication Sig Dispense Refill  . amLODipine (NORVASC) 10 MG tablet TAKE ONE TABLET BY MOUTH ONCE DAILY 90 tablet 3  . aspirin 325 MG tablet Take 325 mg by mouth daily.    . Calcium Carb-Cholecalciferol (CALCIUM 1000 + D PO) Take 1 tablet by mouth daily at 2 PM daily at 2 PM.     . co-enzyme Q-10 30 MG capsule Take 30 mg by mouth 3 (three) times daily.    . hydrochlorothiazide (HYDRODIURIL) 25 MG tablet Take 25 mg by mouth daily after lunch. Reported on 09/24/2015    . levothyroxine (SYNTHROID, LEVOTHROID) 150 MCG tablet Take 1 tablet (150 mcg total) by mouth daily. 90 tablet 3  . metFORMIN (GLUCOPHAGE) 500 MG tablet Take 500 mg by mouth  daily with breakfast.    . Multiple Vitamin (MULTIVITAMIN WITH MINERALS) TABS tablet Take 1 tablet by mouth every morning. Centrum    . pantoprazole (PROTONIX) 40 MG tablet Take 1 tablet (40 mg total) by mouth 2 (two) times daily. 60 tablet 2  . Pravastatin Sodium (PRAVACHOL PO) Take by mouth.    Marland Kitchen PRENATAL VITAMINS PO Take 1 capsule by mouth every morning.     Marland Kitchen tiZANidine (ZANAFLEX) 4 MG tablet Take 1 tablet (4 mg total) by mouth at bedtime. 30 tablet 0   Current Facility-Administered Medications on File Prior to Visit  Medication Dose Route Frequency Provider Last Rate Last Dose  . tranexamic acid (CYKLOKAPRON) 2,000 mg in sodium chloride 0.9 % 50 mL Topical Application  3,785 mg Topical Once [employer], PA-C       Review of Systems  Constitutional: Negative for unusual diaphoresis or night sweats HENT: Negative for ringing in ear or discharge Eyes: Negative for double vision or worsening visual disturbance.  Respiratory: Negative for choking and stridor.   Gastrointestinal: Negative for vomiting or other signifcant bowel change Genitourinary: Negative for hematuria or change in urine volume.  Musculoskeletal: Negative for other MSK pain or swelling Skin: Negative for color change and worsening wound.  Neurological: Negative for tremors and numbness other than noted  Psychiatric/Behavioral: Negative for decreased concentration or agitation other than above       Objective:   Physical Exam BP 136/80 mmHg  Pulse 81  Temp(Src) 98.4 F (36.9 C) (Oral)  Resp 20  Wt 124 lb (56.246 kg)  SpO2 97% VS noted, not ill appearing Constitutional: Pt appears in no significant distress HENT: Head: NCAT.  Right Ear: External ear normal.  Left Ear: External ear normal.  Eyes: . Pupils are equal, round, and reactive to light. Conjunctivae and EOM are normal Neck: Normal range of motion. Neck supple.  Cardiovascular: Normal rate and regular rhythm.   Pulmonary/Chest: Effort normal and  breath sounds without rales or wheezing.  Abd:  Soft, NT, ND, + BS Neurological: Pt is alert. Not confused , motor grossly intact Skin: Skin is warm. No rash, no LE edema Psychiatric: Pt behavior is normal. No agitation. mild irritable today     Assessment & Plan:

## 2015-11-12 NOTE — Assessment & Plan Note (Signed)
Encouraged pt to cont f/u with urology as referred for this, as not likely med related

## 2015-11-24 ENCOUNTER — Ambulatory Visit (INDEPENDENT_AMBULATORY_CARE_PROVIDER_SITE_OTHER): Payer: Medicare Other | Admitting: Sports Medicine

## 2015-11-24 ENCOUNTER — Encounter: Payer: Self-pay | Admitting: Sports Medicine

## 2015-11-24 VITALS — Ht 63.0 in | Wt 120.0 lb

## 2015-11-24 DIAGNOSIS — B351 Tinea unguium: Secondary | ICD-10-CM | POA: Diagnosis not present

## 2015-11-24 DIAGNOSIS — M79672 Pain in left foot: Secondary | ICD-10-CM

## 2015-11-24 DIAGNOSIS — E119 Type 2 diabetes mellitus without complications: Secondary | ICD-10-CM | POA: Diagnosis not present

## 2015-11-24 DIAGNOSIS — M79671 Pain in right foot: Secondary | ICD-10-CM | POA: Diagnosis not present

## 2015-11-24 NOTE — Progress Notes (Signed)
Patient ID: [name], female   DOB: 03/17/1946, 70 y.o.   MRN: [id number] Subjective: [name] is a 70 y.o. female patient with history of diabetes who presents to office today complaining of long, painful nails  while ambulating in shoes; unable to trim. Patient states that the glucose reading this morning was 98 mg/dl. Patient denies any new changes in medication or new problems. Patient denies any new cramping, numbness, burning or tingling in the legs.  Patient Active Problem List   Diagnosis Date Noted  . Microhematuria 11/12/2015  . Left shoulder pain 10/15/2015  . Trapezius muscle spasm 09/24/2015  . Type 2 diabetes mellitus ([place]) 08/07/2015  . Urinary frequency 04/04/2015  . External hemorrhoid 11/02/2014  . Left-sided thoracic back pain 10/03/2014  . Gingivitis 10/03/2014  . Routine general medical examination at a health care facility 09/10/2014  . Total knee replacement status 06/18/2014  . Tooth pain 04/18/2014  . Claudication ([place]) 03/21/2014  . Primary localized osteoarthrosis, lower leg 08/10/2013  . Right knee pain 08/02/2013  . Dyspepsia 11/16/2012  . Knee pain, right 02/18/2012  . PAD (peripheral artery disease) ([place]) 12/03/2011  . PVD (peripheral vascular disease) ([place]) 10/31/2011  . RSD (reflex sympathetic dystrophy) 10/31/2011  . Vitamin D deficiency   . Preop exam for internal medicine 10/28/2011  . Anxiety 03/11/2011  . COPD (chronic obstructive pulmonary disease) ([place]) 02/02/2011  . Anemia 02/02/2011  . History of rheumatic fever 02/02/2011  . Insomnia 02/02/2011  . Coronary atherosclerosis 09/08/2010  . CONSTIPATION 02/19/2010  . TOBACCO ABUSE 07/21/2009  . MURMUR 06/17/2009  . [place] DISEASE, CERVICAL 03/20/2009  . Pain in Soft Tissues of Limb 05/23/2008  . Hypothyroidism 09/07/2007  . Hyperlipidemia LDL goal <70 09/07/2007  . Essential hypertension 09/07/2007  . ALLERGIC RHINITIS 09/07/2007  . GERD 09/07/2007  . Primary osteoarthritis  of right knee 09/07/2007  . Osteopenia 09/07/2007  . COLONIC POLYPS, HX OF 09/07/2007   Current Outpatient Prescriptions on File Prior to Visit  Medication Sig Dispense Refill  . amLODipine (NORVASC) 10 MG tablet TAKE ONE TABLET BY MOUTH ONCE DAILY 90 tablet 3  . aspirin 325 MG tablet Take 325 mg by mouth daily.    . Calcium Carb-Cholecalciferol (CALCIUM 1000 + D PO) Take 1 tablet by mouth daily at 2 PM daily at 2 PM.     . co-enzyme Q-10 30 MG capsule Take 30 mg by mouth 3 (three) times daily.    . hydrochlorothiazide (HYDRODIURIL) 25 MG tablet Take 25 mg by mouth daily after lunch. Reported on 09/24/2015    . levothyroxine (SYNTHROID, LEVOTHROID) 150 MCG tablet Take 1 tablet (150 mcg total) by mouth daily. 90 tablet 3  . metFORMIN (GLUCOPHAGE) 500 MG tablet Take 500 mg by mouth daily with breakfast.    . Multiple Vitamin (MULTIVITAMIN WITH MINERALS) TABS tablet Take 1 tablet by mouth every morning. Centrum    . pantoprazole (PROTONIX) 40 MG tablet Take 1 tablet (40 mg total) by mouth 2 (two) times daily. 60 tablet 2  . Pravastatin Sodium (PRAVACHOL PO) Take by mouth.    Marland Kitchen PRENATAL VITAMINS PO Take 1 capsule by mouth every morning.     Marland Kitchen tiZANidine (ZANAFLEX) 4 MG tablet Take 1 tablet (4 mg total) by mouth at bedtime. 30 tablet 0   Current Facility-Administered Medications on File Prior to Visit  Medication Dose Route Frequency Provider Last Rate Last Dose  . tranexamic acid (CYKLOKAPRON) 2,000 mg in sodium chloride 0.9 % 50 mL Topical  Application  2,330 mg Topical Once [employer], PA-C       Allergies  Allergen Reactions  . Lipitor [Atorvastatin] Other (See Comments)    Muscle aches    Recent Results (from the past 2160 hour(s))  Hepatitis C Antibody     Status: None   Collection Time: 10/15/15 11:06 AM  Result Value Ref Range   HCV Ab NEGATIVE NEGATIVE  Microalbumin / creatinine urine ratio     Status: Abnormal   Collection Time: 10/15/15 11:06 AM  Result Value Ref Range    Microalb, Ur 15.7 (H) 0.0 - 1.9 mg/dL   Creatinine,U 168.0 mg/dL   Microalb Creat Ratio 9.3 0.0 - 30.0 mg/g  Hemoglobin A1c     Status: None   Collection Time: 10/15/15 11:06 AM  Result Value Ref Range   Hgb A1c MFr Bld 6.5 4.6 - 6.5 %    Comment: Glycemic Control Guidelines for People with Diabetes:Non Diabetic:  <6%Goal of Therapy: <7%Additional Action Suggested:  >8%   Lipid panel     Status: None   Collection Time: 10/15/15 11:06 AM  Result Value Ref Range   Cholesterol 179 0 - 200 mg/dL    Comment: ATP III Classification       Desirable:  < 200 mg/dL               Borderline High:  200 - 239 mg/dL          High:  > = 240 mg/dL   Triglycerides 105.0 0.0 - 149.0 mg/dL    Comment: Normal:  <150 mg/dLBorderline High:  150 - 199 mg/dL   HDL 72.70 >39.00 mg/dL   VLDL 21.0 0.0 - 40.0 mg/dL   LDL Cholesterol 85 0 - 99 mg/dL   Total CHOL/HDL Ratio 2     Comment:                Men          Women1/2 Average Risk     3.4          3.3Average Risk          5.0          4.42X Average Risk          9.6          7.13X Average Risk          15.0          11.0                       NonHDL 106.19     Comment: NOTE:  Non-HDL goal should be 30 mg/dL higher than patient's LDL goal (i.e. LDL goal of < 70 mg/dL, would have non-HDL goal of < 100 mg/dL)  Basic metabolic panel     Status: None   Collection Time: 10/15/15 11:06 AM  Result Value Ref Range   Sodium 141 135 - 145 mEq/L   Potassium 3.5 3.5 - 5.1 mEq/L   Chloride 105 96 - 112 mEq/L   CO2 27 19 - 32 mEq/L   Glucose, Bld 97 70 - 99 mg/dL   BUN 12 6 - 23 mg/dL   Creatinine, Ser 0.63 0.40 - 1.20 mg/dL   Calcium 9.4 8.4 - 10.5 mg/dL   GFR 119.67 >60.00 mL/min  Hepatic function panel     Status: None   Collection Time: 10/15/15 11:06 AM  Result Value Ref Range   Total Bilirubin 0.7 0.2 -  1.2 mg/dL   Bilirubin, Direct 0.2 0.0 - 0.3 mg/dL   Alkaline Phosphatase 100 39 - 117 U/L   AST 17 0 - 37 U/L   ALT 13 0 - 35 U/L   Total Protein 7.3 6.0 -  8.3 g/dL   Albumin 4.3 3.5 - 5.2 g/dL  CBC with Differential/Platelet     Status: Abnormal   Collection Time: 10/15/15 11:06 AM  Result Value Ref Range   WBC 7.4 4.0 - 10.5 K/uL   RBC 4.95 3.87 - 5.11 Mil/uL   Hemoglobin 14.6 12.0 - 15.0 g/dL   HCT 42.8 36.0 - 46.0 %   MCV 86.5 78.0 - 100.0 fl   MCHC 34.1 30.0 - 36.0 g/dL   RDW 15.8 (H) 11.5 - 15.5 %   Platelets 200.0 150.0 - 400.0 K/uL   Neutrophils Relative % 56.8 43.0 - 77.0 %   Lymphocytes Relative 33.8 12.0 - 46.0 %   Monocytes Relative 5.5 3.0 - 12.0 %   Eosinophils Relative 3.3 0.0 - 5.0 %   Basophils Relative 0.6 0.0 - 3.0 %   Neutro Abs 4.2 1.4 - 7.7 K/uL   Lymphs Abs 2.5 0.7 - 4.0 K/uL   Monocytes Absolute 0.4 0.1 - 1.0 K/uL   Eosinophils Absolute 0.2 0.0 - 0.7 K/uL   Basophils Absolute 0.0 0.0 - 0.1 K/uL  TSH     Status: Abnormal   Collection Time: 10/15/15 11:06 AM  Result Value Ref Range   TSH 27.00 (H) 0.35 - 4.50 uIU/mL  Urinalysis, Routine w reflex microscopic (not at [facility])     Status: Abnormal   Collection Time: 10/15/15 11:06 AM  Result Value Ref Range   Color, Urine YELLOW Yellow;Lt. Yellow   APPearance CLEAR Clear   Specific Gravity, Urine 1.010 1.000-1.030   pH 6.0 5.0 - 8.0   Total Protein, Urine 30 (A) Negative   Urine Glucose NEGATIVE Negative   Ketones, ur NEGATIVE Negative   Bilirubin Urine NEGATIVE Negative   Hgb urine dipstick MODERATE (A) Negative   Urobilinogen, UA 0.2 0.0 - 1.0   Leukocytes, UA NEGATIVE Negative   Nitrite NEGATIVE Negative   WBC, UA 0-2/hpf 0-2/hpf   RBC / HPF 11-20/hpf (A) 0-2/hpf   Squamous Epithelial / LPF Rare(0-4/hpf) Rare(0-4/hpf)    Objective: General: Patient is awake, alert, and oriented x 3 and in no acute distress.  Integument: Skin is warm, dry and supple bilateral. Nails are tender, long, thickened and  dystrophic with subungual debris, consistent with onychomycosis, 1-5 bilateral. No signs of infection. No open lesions or preulcerative lesions present  bilateral. Remaining integument unremarkable.  Vasculature:  Dorsalis Pedis pulse 2/4 bilateral. Posterior Tibial pulse  1/4 bilateral.  Capillary fill time <3 sec 1-5 bilateral. Positive hair growth to the level of the digits. Temperature gradient within normal limits. No varicosities present bilateral. No edema present bilateral.   Neurology: The patient has intact sensation measured with a 5.07/10g Semmes Weinstein Monofilament at all pedal sites bilateral . Vibratory sensation intact bilateral with tuning fork. No Babinski sign present bilateral.   Musculoskeletal: Asymptomatic bunion and hammertoe gross pedal deformities noted bilateral. Muscular strength 5/5 in all lower extremity muscular groups bilateral without pain on range of motion . No tenderness with calf compression bilateral.  Assessment and Plan: Problem List Items Addressed This Visit    None    Visit Diagnoses    Dermatophytosis of nail    -  Primary    Foot pain, bilateral  Diabetes mellitus without complication ([place])          -Examined patient. -Discussed and educated patient on diabetic foot care, especially with  regards to the vascular, neurological and musculoskeletal systems.  -Stressed the importance of good glycemic control and the detriment of not  controlling glucose levels in relation to the foot. -Mechanically debrided all nails 1-5 bilateral using sterile nail nipper and filed with dremel without incident  -Recommend good supportive shoes daily for foot type -Answered all patient questions -Patient to return in 3 months for at risk foot care -Patient advised to call the office if any problems or questions arise in the meantime.  Landis Martins, DPM

## 2015-11-24 NOTE — Patient Instructions (Addendum)
Diabetes and Foot Care Diabetes may cause you to have problems because of poor blood supply (circulation) to your feet and legs. This may cause the skin on your feet to become thinner, break easier, and heal more slowly. Your skin may become dry, and the skin may peel and crack. You may also have nerve damage in your legs and feet causing decreased feeling in them. You may not notice minor injuries to your feet that could lead to infections or more serious problems. Taking care of your feet is one of the most important things you can do for yourself.  HOME CARE INSTRUCTIONS  Wear shoes at all times, even in the house. Do not go barefoot. Bare feet are easily injured.  Check your feet daily for blisters, cuts, and redness. If you cannot see the bottom of your feet, use a mirror or ask someone for help.  Wash your feet with warm water (do not use hot water) and mild soap. Then pat your feet and the areas between your toes until they are completely dry. Do not soak your feet as this can dry your skin.  Apply a moisturizing lotion or petroleum jelly (that does not contain alcohol and is unscented) to the skin on your feet and to dry, brittle toenails. Do not apply lotion between your toes.  Trim your toenails straight across. Do not dig under them or around the cuticle. File the edges of your nails with an emery board or nail file.  Do not cut corns or calluses or try to remove them with medicine.  Wear clean socks or stockings every day. Make sure they are not too tight. Do not wear knee-high stockings since they may decrease blood flow to your legs.  Wear shoes that fit properly and have enough cushioning. To break in new shoes, wear them for just a few hours a day. This prevents you from injuring your feet. Always look in your shoes before you put them on to be sure there are no objects inside.  Do not cross your legs. This may decrease the blood flow to your feet.  If you find a minor scrape,  cut, or break in the skin on your feet, keep it and the skin around it clean and dry. These areas may be cleansed with mild soap and water. Do not cleanse the area with peroxide, alcohol, or iodine.  When you remove an adhesive bandage, be sure not to damage the skin around it.  If you have a wound, look at it several times a day to make sure it is healing.  Do not use heating pads or hot water bottles. They may burn your skin. If you have lost feeling in your feet or legs, you may not know it is happening until it is too late.  Make sure your health care provider performs a complete foot exam at least annually or more often if you have foot problems. Report any cuts, sores, or bruises to your health care provider immediately. SEEK MEDICAL CARE IF:   You have an injury that is not healing.  You have cuts or breaks in the skin.  You have an ingrown nail.  You notice redness on your legs or feet.  You feel burning or tingling in your legs or feet.  You have pain or cramps in your legs and feet.  Your legs or feet are numb.  Your feet always feel cold. SEEK IMMEDIATE MEDICAL CARE IF:   There is increasing redness,   swelling, or pain in or around a wound.  There is a red line that goes up your leg.  Pus is coming from a wound.  You develop a fever or as directed by your health care provider.  You notice a bad smell coming from an ulcer or wound.   This information is not intended to replace advice given to you by your health care provider. Make sure you discuss any questions you have with your health care provider.   Document Released: 08/06/2000 Document Revised: 04/11/2013 Document Reviewed: 01/16/2013 Elsevier Interactive Patient Education 2016 [place]   Fungi Nail  Tea Tree Oil

## 2016-01-29 IMAGING — US US ABDOMEN COMPLETE
1 series · 13 of 25 positions shown · non-contrast
Comparison: Ultrasound November 16, 2007.

CLINICAL DATA: Right upper quadrant abdominal pain.

EXAM:
ULTRASOUND ABDOMEN COMPLETE

[Series 1: us abdomen complete · 0.24mm/px · 13 of 82 slices shown]
[im 1/82]
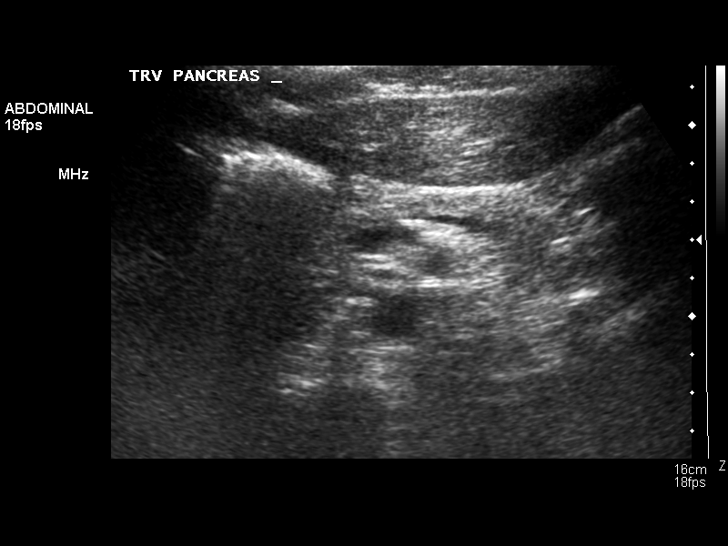
[im 7/82]
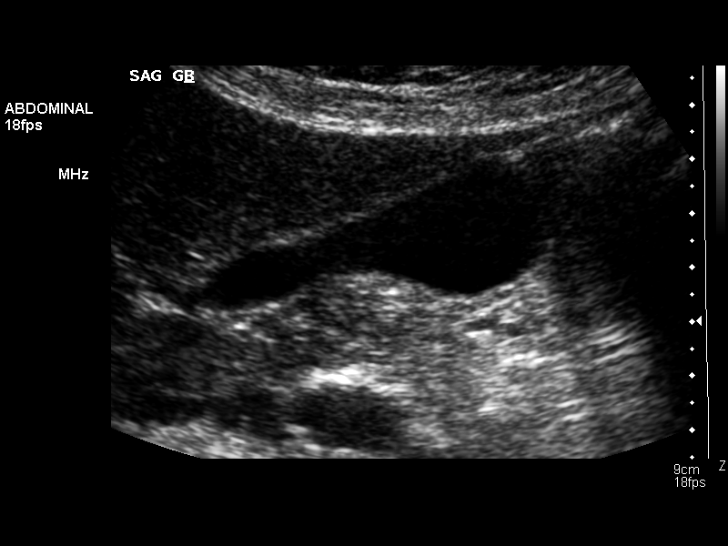
[im 14/82]
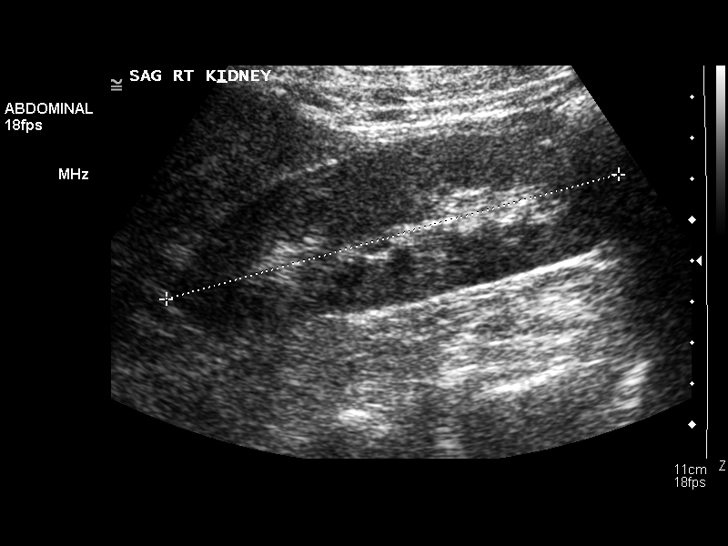
[im 21/82]
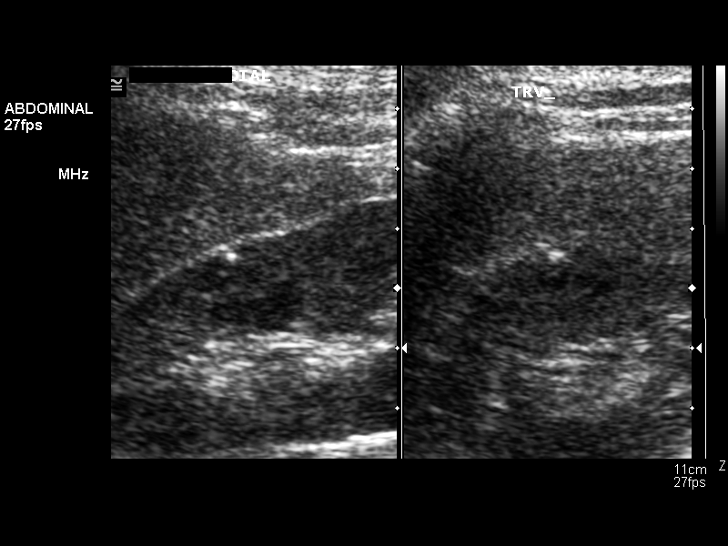
[im 28/82]
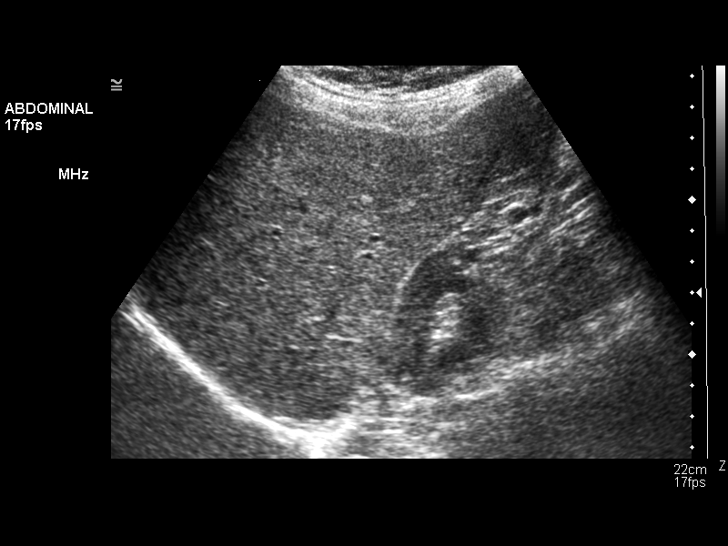
[im 34/82]
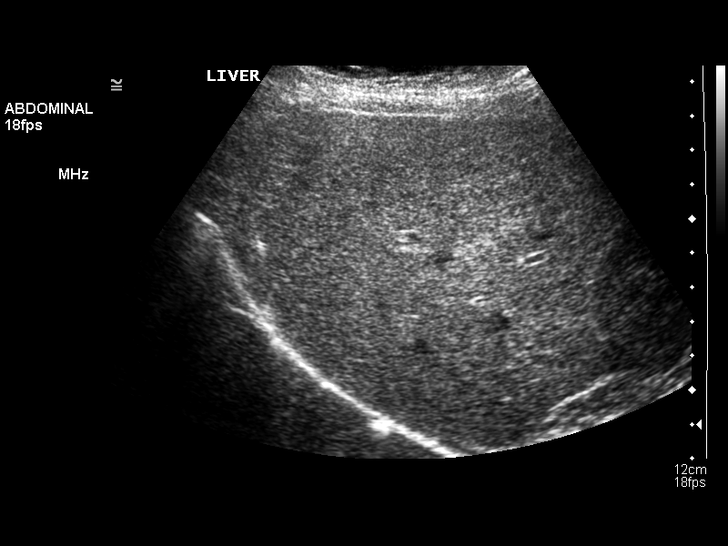
[im 41/82]
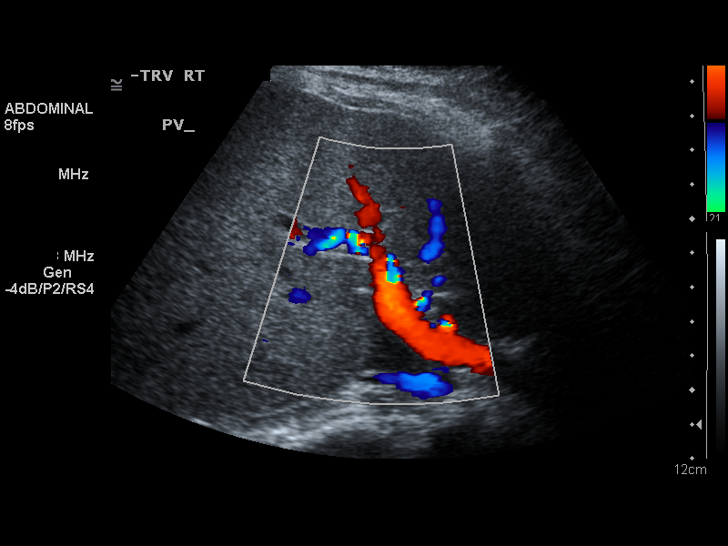
[im 48/82]
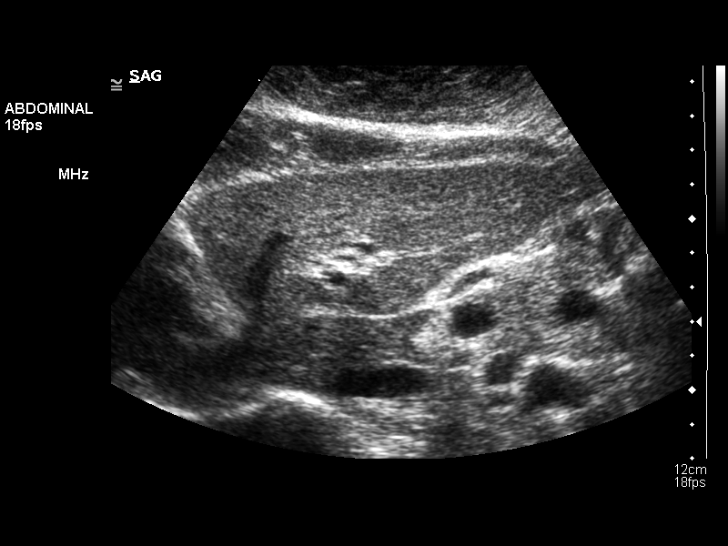
[im 55/82]
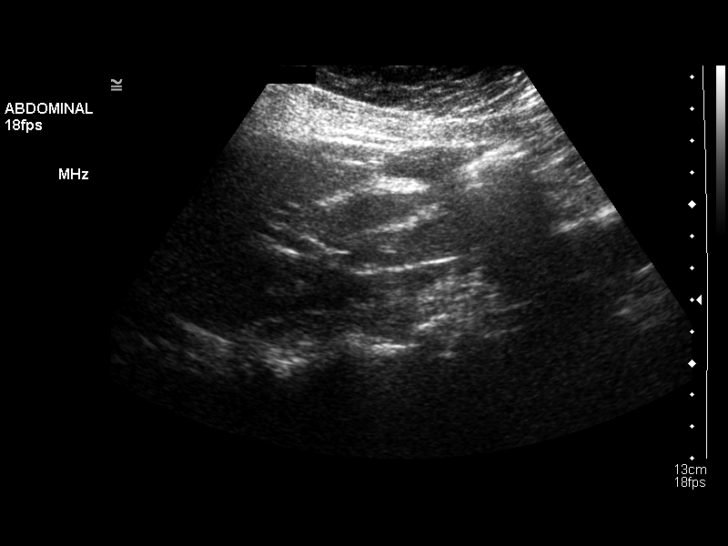
[im 61/82]
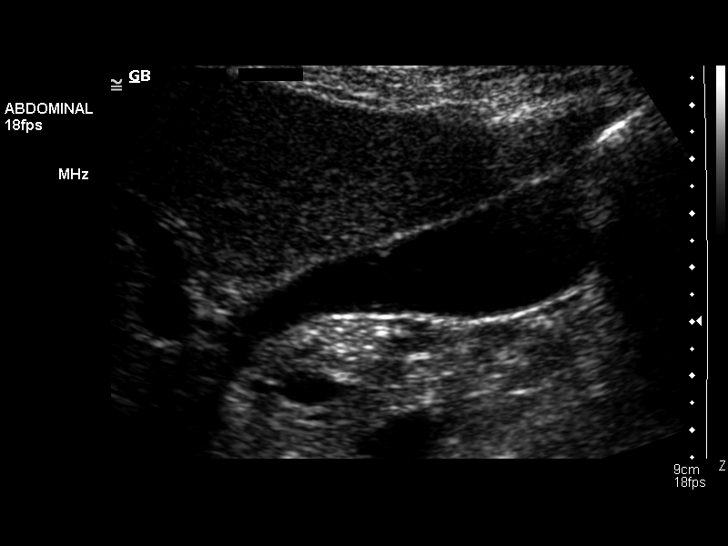
[im 68/82]
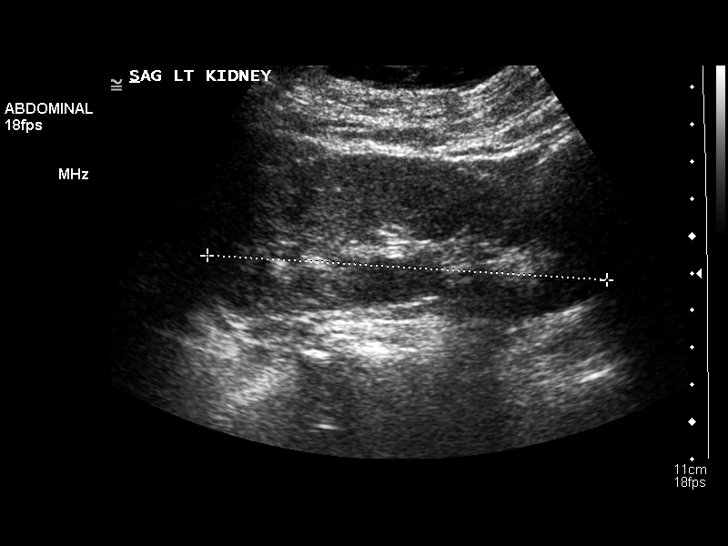
[im 75/82]
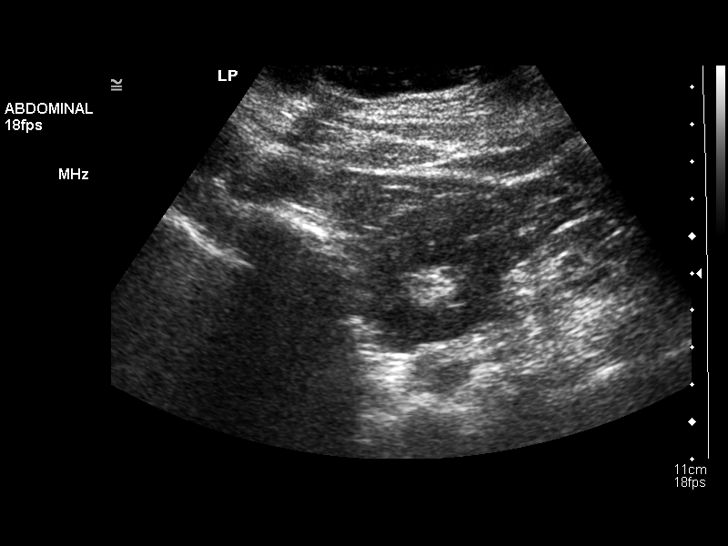
[im 82/82]
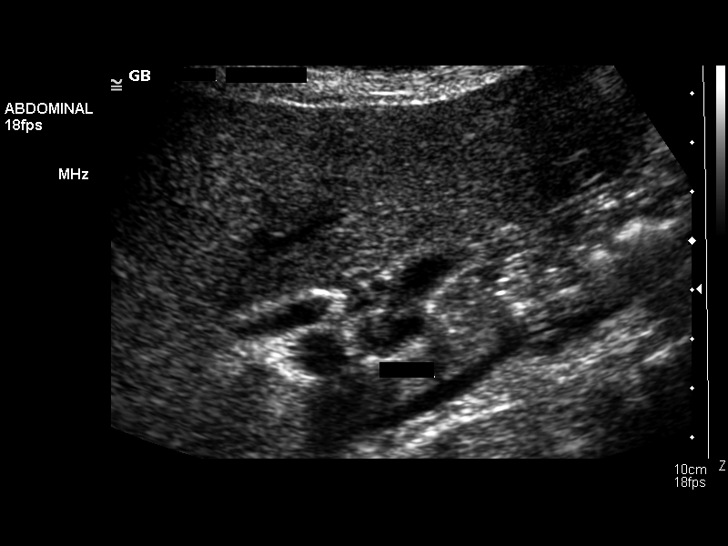

[13 of 25 positions shown; findings below may reference images not displayed]

FINDINGS: Gallbladder:

No gallstones or wall thickening visualized. No sonographic Murphy
sign noted.

Common bile duct:

Diameter: Measures 5.5 mm which is within normal limits.

Liver:

Hepatic parenchyma is slightly heterogeneous in appearance. Mildly
septated cyst measuring 1.4 x 1.2 cm is noted in right hepatic lobe.

IVC:

No abnormality visualized.

Pancreas:

Visualized portion unremarkable.

Spleen:

Size and appearance within normal limits.

Right Kidney:

Length: 11.6 cm.. Echogenicity within normal limits. No
hydronephrosis visualized. 2.7 mm echogenic focus is noted in medial
cortex of right kidney concerning for angiomyolipoma.

Left Kidney:

Length: 11.3 cm. Echogenicity within normal limits. No mass or
hydronephrosis visualized.

Abdominal aorta:

No aneurysm is noted.  Atheromatous disease is noted.

Other findings:

None.
IMPRESSION: 2.7 mm echogenic focus noted in medial cortex of right kidney which
may represent cortical calcification or small angiomyolipoma.

1.4 cm mildly septated cyst seen in right hepatic lobe. Followup
ultrasound in 6 months is recommended to ensure stability.

No other significant abnormality seen in the abdomen.

## 2016-02-12 ENCOUNTER — Other Ambulatory Visit: Payer: Self-pay | Admitting: Internal Medicine

## 2016-02-18 DIAGNOSIS — Z6822 Body mass index (BMI) 22.0-22.9, adult: Secondary | ICD-10-CM | POA: Diagnosis not present

## 2016-02-18 DIAGNOSIS — I8390 Asymptomatic varicose veins of unspecified lower extremity: Secondary | ICD-10-CM | POA: Diagnosis not present

## 2016-02-18 DIAGNOSIS — E890 Postprocedural hypothyroidism: Secondary | ICD-10-CM | POA: Diagnosis not present

## 2016-02-23 ENCOUNTER — Ambulatory Visit: Payer: Medicare Other | Admitting: Sports Medicine

## 2016-04-06 ENCOUNTER — Ambulatory Visit (INDEPENDENT_AMBULATORY_CARE_PROVIDER_SITE_OTHER): Payer: Medicare Other | Admitting: Family

## 2016-04-06 ENCOUNTER — Encounter: Payer: Self-pay | Admitting: Family

## 2016-04-06 VITALS — BP 142/70 | HR 76 | Temp 98.2°F | Resp 16 | Ht 63.0 in | Wt 131.0 lb

## 2016-04-06 DIAGNOSIS — M24549 Contracture, unspecified hand: Secondary | ICD-10-CM | POA: Insufficient documentation

## 2016-04-06 DIAGNOSIS — L089 Local infection of the skin and subcutaneous tissue, unspecified: Secondary | ICD-10-CM

## 2016-04-06 DIAGNOSIS — J069 Acute upper respiratory infection, unspecified: Secondary | ICD-10-CM | POA: Diagnosis not present

## 2016-04-06 DIAGNOSIS — M24541 Contracture, right hand: Secondary | ICD-10-CM | POA: Diagnosis not present

## 2016-04-06 MED ORDER — TRIAMCINOLONE ACETONIDE 0.1 % EX CREA: 1.0000 "application " | TOPICAL_CREAM | Freq: Two times a day (BID) | CUTANEOUS | 0 refills | Status: DC

## 2016-04-06 NOTE — Assessment & Plan Note (Signed)
Symptoms and exam consistent with upper respiratory infection most likely viral. She conservatively with over-the-counter medications as needed for symptom relief and supportive care. Follow-up if symptoms worsen or fail to improve.

## 2016-04-06 NOTE — Assessment & Plan Note (Signed)
Symptoms and exam consistent with skin inflammation possible from an insect bite. Start triamcinalone cream as needed for itching and inflammation. Follow up if symptoms worsen or do not improve.

## 2016-04-06 NOTE — Patient Instructions (Addendum)
Thank you for choosing [facility].  Summary/Instructions:   Apply the cream 2 times per day as needed for itching and inflammation.  They will call with your referral to hand surgery.  Your prescription(s) have been submitted to your pharmacy or been printed and provided for you. Please take as directed and contact our office if you believe you are having problem(s) with the medication(s) or have any questions.  If your symptoms worsen or fail to improve, please contact our office for further instruction, or in case of emergency go directly to the emergency room at the closest medical facility.   General Recommendations:    Please drink plenty of fluids.  Get plenty of rest   Sleep in humidified air  Use saline nasal sprays  Netti pot   OTC Medications:  Decongestants - helps relieve congestion   Flonase (generic fluticasone) or Nasacort (generic triamcinolone) - please make sure to use the "cross-over" technique at a 45 degree angle towards the opposite eye as opposed to straight up the nasal passageway.   Sudafed (generic pseudoephedrine - Note this is the one that is available behind the pharmacy counter); Products with phenylephrine (-PE) may also be used but is often not as effective as pseudoephedrine.   If you have HIGH BLOOD PRESSURE - Coricidin HBP; AVOID any product that is -D as this contains pseudoephedrine which may increase your blood pressure.  Afrin (oxymetazoline) every 6-8 hours for up to 3 days.   Allergies - helps relieve runny nose, itchy eyes and sneezing   Claritin (generic loratidine), Allegra (fexofenidine), or Zyrtec (generic cyrterizine) for runny nose. These medications should not cause drowsiness.  Note - Benadryl (generic diphenhydramine) may be used however may cause drowsiness  Cough -   Delsym or Robitussin (generic dextromethorphan)  Expectorants - helps loosen mucus to ease removal   Mucinex (generic guaifenesin) as  directed on the package.  Headaches / General Aches   Tylenol (generic acetaminophen) - DO NOT EXCEED 3 grams (3,000 mg) in a 24 hour time period  Advil/Motrin (generic ibuprofen)   Sore Throat -   Salt water gargle   Chloraseptic (generic benzocaine) spray or lozenges / Sucrets (generic dyclonine)

## 2016-04-06 NOTE — Assessment & Plan Note (Signed)
Symptoms and exam consistent with possible contracture although cannot rule out nerve dysfunction secondary to previous surgery as described per patient. Referral to orthopedics place per patient request.

## 2016-04-06 NOTE — Progress Notes (Signed)
Subjective:    Patient ID: [name], female    DOB: 01-21-1946, 70 y.o.   MRN: [id number]  Chief Complaint  Patient presents with  . Generalized Body Aches    x2 weeks has been having body aches, right hand aching and not straightening out    HPI:  [name] is a 70 y.o. female who  has a past medical history of Anemia; Anxiety; Arthritis; COLONIC POLYPS, HX OF; COPD (chronic obstructive pulmonary disease) ([place]); Coronary artery disease; DISC DISEASE, CERVICAL; Gastroesophageal reflux disease; Heart murmur; History of rheumatic fever; Hyperlipidemia; Hypertension; Hypothyroidism; Insomnia; Osteoarthritis; OSTEOPENIA; PAD (peripheral artery disease) ([place]) (12/03/2011); PVD (peripheral vascular disease) ([place]) (10/31/2011); RSD (reflex sympathetic dystrophy) (10/31/2011); and Vitamin D deficiency. and presents today for a follow up office visit.   1.) Body aches - This is a new problem. Associated symptom of body aches has been going on for about 1 month . She has also experienced some coughing and sneezing. Modifying factors include allergy medication which has not helped very much. Has about 3 weeks ago helped her daughter move. Symptoms have generally worsened since initial onset. No fevers. No recent antibiotic use.  2.) Spot on her leg - This is a new problem. Associated symptom of a spot located on her lower leg has been going on for about 3 weeks. Describes as changes in color and modifying factors include OTC hydrocortisone cream which has not helped very much. Does not recall any tick bites or being in a wooded area. No other rashes.   3.) Right hand  - Associated symptom of limited range of motion located in her right hand has been going on for several years. Indicates that she did have carpal tunnel release and they may have "hit her nerve" during the procedure. Now has decreased flexion and extension of her fingers.    Allergies  Allergen Reactions  . Lipitor  [Atorvastatin] Other (See Comments)    Muscle aches     Current Outpatient Prescriptions on File Prior to Visit  Medication Sig Dispense Refill  . amLODipine (NORVASC) 10 MG tablet TAKE ONE TABLET BY MOUTH ONCE DAILY 90 tablet 0  . aspirin 325 MG tablet Take 325 mg by mouth daily.    . Calcium Carb-Cholecalciferol (CALCIUM 1000 + D PO) Take 1 tablet by mouth daily at 2 PM daily at 2 PM.     . co-enzyme Q-10 30 MG capsule Take 30 mg by mouth 3 (three) times daily.    . hydrochlorothiazide (HYDRODIURIL) 25 MG tablet Take 25 mg by mouth daily after lunch. Reported on 09/24/2015    . levothyroxine (SYNTHROID, LEVOTHROID) 150 MCG tablet Take 1 tablet (150 mcg total) by mouth daily. 90 tablet 3  . metFORMIN (GLUCOPHAGE) 500 MG tablet Take 500 mg by mouth daily with breakfast.    . Multiple Vitamin (MULTIVITAMIN WITH MINERALS) TABS tablet Take 1 tablet by mouth every morning. Centrum    . pantoprazole (PROTONIX) 40 MG tablet Take 1 tablet (40 mg total) by mouth 2 (two) times daily. 60 tablet 2  . Pravastatin Sodium (PRAVACHOL PO) Take by mouth.    Marland Kitchen PRENATAL VITAMINS PO Take 1 capsule by mouth every morning.     Marland Kitchen tiZANidine (ZANAFLEX) 4 MG tablet Take 1 tablet (4 mg total) by mouth at bedtime. 30 tablet 0   Current Facility-Administered Medications on File Prior to Visit  Medication Dose Route Frequency Provider Last Rate Last Dose  . tranexamic acid (CYKLOKAPRON) 2,000 mg  in sodium chloride 0.9 % 50 mL Topical Application  4,132 mg Topical Once Ardeen Jourdain, PA-C         Past Surgical History:  Procedure Laterality Date  . ABDOMINAL HYSTERECTOMY  1977   "partial"  . BALLOON ANGIOPLASTY, ARTERY Right 03/21/2014   SFA  . CARDIAC CATHETERIZATION  2011   . CARPAL TUNNEL RELEASE Right 2005  . CATARACT EXTRACTION Left 2013  . CORONARY ARTERY BYPASS GRAFT  10/2009   LIMA to the LAD, saphenous vein graft to the acute marginal, saphenous vein graft to the diagonal and obtuse marginal.  .  DILATION AND CURETTAGE OF UTERUS  1978  . LOWER EXTREMITY ANGIOGRAM N/A 03/21/2014   Procedure: LOWER EXTREMITY ANGIOGRAM;  Surgeon: Lorretta Harp, MD;  Location: [facility] CATH LAB;  Service: Cardiovascular;  Laterality: N/A;  . THYROIDECTOMY  1995  . TONSILLECTOMY    . TOTAL KNEE ARTHROPLASTY Right 06/18/2014   Procedure: RIGHT TOTAL KNEE ARTHROPLASTY;  Surgeon: Tobi Bastos, MD;  Location: [facility] ORS;  Service: Orthopedics;  Laterality: Right;   Past Medical History:  Diagnosis Date  . Anemia   . Anxiety    hx  . Arthritis    "my whole body"  . COLONIC POLYPS, HX OF   . COPD (chronic obstructive pulmonary disease) ([place])    no per pt on 03/21/2014  . Coronary artery disease   . DISC DISEASE, CERVICAL   . Gastroesophageal reflux disease   . Heart murmur    as a child  . History of rheumatic fever   . Hyperlipidemia   . Hypertension   . Hypothyroidism   . Insomnia   . Osteoarthritis   . OSTEOPENIA   . PAD (peripheral artery disease) ([place]) 12/03/2011   status post right SFA Turbo Hawk atherectomy  . PVD (peripheral vascular disease) ([place]) 10/31/2011  . RSD (reflex sympathetic dystrophy) 10/31/2011  . Vitamin D deficiency      Review of Systems  Constitutional: Negative for chills and fever.  HENT: Positive for congestion, rhinorrhea, sneezing and sore throat. Negative for ear pain.   Respiratory: Positive for cough. Negative for chest tightness and shortness of breath.   Cardiovascular: Negative for chest pain, palpitations and leg swelling.  Neurological: Positive for weakness. Negative for numbness.      Objective:    BP (!) 142/70 (BP Location: Left Arm, Patient Position: Sitting, Cuff Size: Normal)   Pulse 76   Temp 98.2 F (36.8 C) (Oral)   Resp 16   Ht '5\' 3"'$  (1.6 m)   Wt 131 lb (59.4 kg)   SpO2 95%   BMI 23.21 kg/m  Nursing note and vital signs reviewed.  Physical Exam  Constitutional: She is oriented to person, place, and time. She appears well-developed and  well-nourished. No distress.  Eyes: Conjunctivae and EOM are normal. Pupils are equal, round, and reactive to light.  Cardiovascular: Normal rate, regular rhythm, normal heart sounds and intact distal pulses.   Pulmonary/Chest: Effort normal and breath sounds normal. She has no wheezes. She has no rales. She exhibits no tenderness.  Neurological: She is alert and oriented to person, place, and time.  Skin: Skin is warm and dry.  Mild inflammation of left lower leg with no obvious skin breakdown.  Psychiatric: She has a normal mood and affect. Her behavior is normal. Judgment and thought content normal.       Assessment & Plan:   Problem List Items Addressed This Visit      Respiratory  Upper respiratory infection    Symptoms and exam consistent with upper respiratory infection most likely viral. She conservatively with over-the-counter medications as needed for symptom relief and supportive care. Follow-up if symptoms worsen or fail to improve.        Musculoskeletal and Integument   Skin inflammation - Primary    Symptoms and exam consistent with skin inflammation possible from an insect bite. Start triamcinalone cream as needed for itching and inflammation. Follow up if symptoms worsen or do not improve.       Relevant Medications   triamcinolone cream (KENALOG) 0.1 %   Contracture of hand joint    Symptoms and exam consistent with possible contracture although cannot rule out nerve dysfunction secondary to previous surgery as described per patient. Referral to orthopedics place per patient request.      Relevant Orders   AMB referral to orthopedics    Other Visit Diagnoses   None.      I am having Ms. Consiglio start on triamcinolone cream. I am also having her maintain her Calcium Carb-Cholecalciferol (CALCIUM 1000 + D PO), hydrochlorothiazide, PRENATAL VITAMINS PO, multivitamin with minerals, aspirin, pantoprazole, co-enzyme Q-10, Pravastatin Sodium (PRAVACHOL PO),  tiZANidine, levothyroxine, metFORMIN, and amLODipine.   Meds ordered this encounter  Medications  . triamcinolone cream (KENALOG) 0.1 %    Sig: Apply 1 application topically 2 (two) times daily.    Dispense:  30 g    Refill:  0    Order Specific Question:   Supervising Provider    Answer:   Pricilla Holm A [[id number]]     Follow-up: Return in about 3 weeks (around 04/27/2016), or if symptoms worsen or fail to improve.  Mauricio Po, FNP

## 2016-04-16 ENCOUNTER — Encounter: Payer: Self-pay | Admitting: Internal Medicine

## 2016-04-16 DIAGNOSIS — Z1231 Encounter for screening mammogram for malignant neoplasm of breast: Secondary | ICD-10-CM | POA: Diagnosis not present

## 2016-04-16 DIAGNOSIS — Z803 Family history of malignant neoplasm of breast: Secondary | ICD-10-CM | POA: Diagnosis not present

## 2016-05-19 ENCOUNTER — Telehealth: Payer: Self-pay | Admitting: *Deleted

## 2016-05-19 ENCOUNTER — Other Ambulatory Visit: Payer: Self-pay | Admitting: *Deleted

## 2016-05-19 ENCOUNTER — Encounter: Payer: Self-pay | Admitting: Cardiovascular Disease

## 2016-05-19 ENCOUNTER — Ambulatory Visit (INDEPENDENT_AMBULATORY_CARE_PROVIDER_SITE_OTHER): Payer: Medicare Other | Admitting: Cardiovascular Disease

## 2016-05-19 VITALS — BP 144/86 | HR 78 | Ht 63.0 in | Wt 133.0 lb

## 2016-05-19 DIAGNOSIS — Z01818 Encounter for other preprocedural examination: Secondary | ICD-10-CM | POA: Diagnosis not present

## 2016-05-19 DIAGNOSIS — I739 Peripheral vascular disease, unspecified: Secondary | ICD-10-CM

## 2016-05-19 NOTE — Progress Notes (Signed)
05/19/2016 [name]   December 17, 1945  [id number]  Primary Physician [name], MD Primary Cardiologist: Lorretta Harp MD Renae Gloss  HPI:    [name] is a 70 year old African American female patient of Dr. Jacalyn Lefevre referred for peripheral vascular evaluation. She was seen by Dr. Gladstone Lighter  orthopedic surgeon, who referred her here. I last saw her in the office 05/29/14. Her past history is remarkable for ischemic heart disease status post coronary bypass grafting in March 2012 of the LIMA to LAD, vein to the diagonal branch, obtuse marginal branch and a vein to acute marginal branch. She has normal LV function by 2-D echo. Further problems include hypertension, and hyperlipidemia. She complains of right lower extremity claudication and had seen Dr. Fletcher Anon in the past who thought that her pain was arthritic in nature. Lower extremity arterial Doppler studies performed 07/07/14 revealed a right ABI 0.43 with an occluded distal right SFA and popliteal artery, left ABI 0.73 with a high-frequency signal in the mid left SFA. I performed angiography on her 753/15 revealing an occluded left SFA with one vessel runoff via the peroneal. I performed TurboHawk directional atherectomy followed by PTA using drug-eluting balloon with excellent angiographic result. Her followup Doppler revealed an increase in her right ABI from 0.4 to .7 with resolution of her claudication symptoms. Since I saw her over 2 years ago she developed claudication in her left leg now. I suspect her pubis a documented lesions in her left external leg artery and SFA have progressed.   Current Outpatient Prescriptions  Medication Sig Dispense Refill  . amLODipine (NORVASC) 10 MG tablet TAKE ONE TABLET BY MOUTH ONCE DAILY 90 tablet 0  . aspirin 325 MG tablet Take 325 mg by mouth daily.    . Calcium Carb-Cholecalciferol (CALCIUM 1000 + D PO) Take 1 tablet by mouth daily at 2 PM daily at 2 PM.     . co-enzyme Q-10 30  MG capsule Take 30 mg by mouth daily as needed.     . hydrochlorothiazide (HYDRODIURIL) 25 MG tablet Take 25 mg by mouth daily after lunch. Reported on 09/24/2015    . levothyroxine (SYNTHROID, LEVOTHROID) 150 MCG tablet Take 1 tablet (150 mcg total) by mouth daily. 90 tablet 3  . Multiple Vitamin (MULTIVITAMIN WITH MINERALS) TABS tablet Take 1 tablet by mouth every morning. Centrum    . pantoprazole (PROTONIX) 40 MG tablet Take 1 tablet (40 mg total) by mouth 2 (two) times daily. 60 tablet 2  . Pravastatin Sodium (PRAVACHOL PO) Take by mouth.    Marland Kitchen PRENATAL VITAMINS PO Take 1 capsule by mouth every morning.     Marland Kitchen tiZANidine (ZANAFLEX) 4 MG tablet Take 1 tablet (4 mg total) by mouth at bedtime. 30 tablet 0  . triamcinolone cream (KENALOG) 0.1 % Apply 1 application topically 2 (two) times daily. 30 g 0  . metFORMIN (GLUCOPHAGE) 500 MG tablet Take 500 mg by mouth daily with breakfast.     No current facility-administered medications for this visit.    Facility-Administered Medications Ordered in Other Visits  Medication Dose Route Frequency Provider Last Rate Last Dose  . tranexamic acid (CYKLOKAPRON) 2,000 mg in sodium chloride 0.9 % 50 mL Topical Application  9,528 mg Topical Once [employer], PA-C        Allergies  Allergen Reactions  . Lipitor [Atorvastatin] Other (See Comments)    Muscle aches    Social History   Social History  . Marital status:  Single    Spouse name: N/A  . Number of children: 2  . Years of education: 14   Occupational History  . Retired Retired   Social History Main Topics  . Smoking status: Former Smoker    Packs/day: 0.80    Years: 45.00    Types: Cigarettes    Quit date: 08/23/2009  . Smokeless tobacco: Never Used  . Alcohol use No     Comment: WINE OCC.  . Drug use: No  . Sexual activity: Not Currently   Other Topics Concern  . Not on file   Social History Narrative  . No narrative on file     Review of Systems: General: negative for  chills, fever, night sweats or weight changes.  Cardiovascular: negative for chest pain, dyspnea on exertion, edema, orthopnea, palpitations, paroxysmal nocturnal dyspnea or shortness of breath Dermatological: negative for rash Respiratory: negative for cough or wheezing Urologic: negative for hematuria Abdominal: negative for nausea, vomiting, diarrhea, bright red blood per rectum, melena, or hematemesis Neurologic: negative for visual changes, syncope, or dizziness All other systems reviewed and are otherwise negative except as noted above.    Blood pressure (!) 144/86, pulse 78, height '5\' 3"'$  (1.6 m), weight 133 lb (60.3 kg).  General appearance: alert and no distress Neck: no adenopathy, no carotid bruit, no JVD, supple, symmetrical, trachea midline and thyroid not enlarged, symmetric, no tenderness/mass/nodules Lungs: clear to auscultation bilaterally Heart: regular rate and rhythm, S1, S2 normal, no murmur, click, rub or gallop Extremities: extremities normal, atraumatic, no cyanosis or edema  EKG normal sinus rhythm at 78 with septal Q waves and nonspecific ST and T-wave changes. I personally reviewed this EKG  ASSESSMENT AND PLAN:   PAD (peripheral artery disease) [name] has peripheral vascular disease. I performed one directional atherectomy followed by drug eluding balloon angioplasty on her left SFA chronic total occlusion 03/21/14. She did have one vessel run off below her knee with a patent peroneal artery. Her claudication resolved at that time her Dopplers improved as well. Her most recent Dopplers performed 09/24/14 revealed a right ABI of 0.66 with a patent right SFA and a left ABI 0.5 to high-frequency signal in her mid left SFA. This should be noted that she had one-vessel runoff on the left at that time was 70% left external iliac artery stenosis 80% mid left SFA. Over the last several months she's noticed increasing left lower extremity claudication. I suspect she's had  progression of disease on that side. I'm going to repeat her lower extremity arterial Doppler studies and arrange for her to undergo angiography and potential endovascular therapy for lifestyle limiting claudication.      Lorretta Harp MD FACP,FACC,FAHA, [place] 05/19/2016 11:41 AM

## 2016-05-19 NOTE — Assessment & Plan Note (Signed)
[name] has peripheral vascular disease. I performed one directional atherectomy followed by drug eluding balloon angioplasty on her left SFA chronic total occlusion 03/21/14. She did have one vessel run off below her knee with a patent peroneal artery. Her claudication resolved at that time her Dopplers improved as well. Her most recent Dopplers performed 09/24/14 revealed a right ABI of 0.66 with a patent right SFA and a left ABI 0.5 to high-frequency signal in her mid left SFA. This should be noted that she had one-vessel runoff on the left at that time was 70% left external iliac artery stenosis 80% mid left SFA. Over the last several months she's noticed increasing left lower extremity claudication. I suspect she's had progression of disease on that side. I'm going to repeat her lower extremity arterial Doppler studies and arrange for her to undergo angiography and potential endovascular therapy for lifestyle limiting claudication.

## 2016-05-19 NOTE — Patient Instructions (Addendum)
Medication Instructions:  NO CHANGES.   Testing/Procedures:  Your physician has requested that you have a lower extremity arterial doppler- During this test, ultrasound is used to evaluate arterial blood flow in the legs. Allow approximately one hour for this exam. NEEDS TO BE BEFORE THE PV ANGIOGRAM.   Dr. Gwenlyn Found has ordered a peripheral angiogram to be done at [facility].  This procedure is going to look at the bloodflow in your lower extremities.  If Dr. Gwenlyn Found is able to open up the arteries, you will have to spend one night in the hospital.  If he is not able to open the arteries, you will be able to go home that same day.    After the procedure, you will not be allowed to drive for 3 days or push, pull, or lift anything greater than 10 lbs for one week.    You will be required to have the following tests prior to the procedure:  1. Blood work-the blood work can be done no more than 14 days prior to the procedure.  It can be done at any [facility] lab.  There is one downstairs on the first floor of this building and one in the [facility] building [phone] [address])  2. Chest Xray-the chest xray order has already been placed at the [facility].      *REPS  SCOTT  Puncture site   RIGHT GROIN    If you need a refill on your cardiac medications before your next appointment, please call your pharmacy.

## 2016-05-19 NOTE — Telephone Encounter (Signed)
Left message regarding procedure scheduled on 06/07/16

## 2016-05-20 ENCOUNTER — Other Ambulatory Visit: Payer: Self-pay | Admitting: Internal Medicine

## 2016-05-20 ENCOUNTER — Other Ambulatory Visit: Payer: Self-pay | Admitting: Cardiovascular Disease

## 2016-05-20 DIAGNOSIS — I739 Peripheral vascular disease, unspecified: Secondary | ICD-10-CM

## 2016-05-25 ENCOUNTER — Ambulatory Visit
Admission: RE | Admit: 2016-05-25 | Discharge: 2016-05-25 | Disposition: A | Discharge status: Home / Self Care | Payer: Medicare Other | Source: Ambulatory Visit | Attending: Cardiovascular Disease | Admitting: Cardiovascular Disease

## 2016-05-25 DIAGNOSIS — I739 Peripheral vascular disease, unspecified: Secondary | ICD-10-CM

## 2016-05-25 DIAGNOSIS — Z01818 Encounter for other preprocedural examination: Secondary | ICD-10-CM

## 2016-05-26 ENCOUNTER — Other Ambulatory Visit: Payer: Self-pay | Admitting: Internal Medicine

## 2016-05-26 DIAGNOSIS — E039 Hypothyroidism, unspecified: Secondary | ICD-10-CM

## 2016-05-26 LAB — CBC WITH DIFFERENTIAL/PLATELET
Basophils Absolute: 0 cells/uL (ref 0–200)
Basophils Relative: 0 %
Eosinophils Absolute: 171 cells/uL (ref 15–500)
Eosinophils Relative: 3 %
HCT: 41.3 % (ref 35.0–45.0)
Hemoglobin: 13.5 g/dL (ref 11.7–15.5)
Lymphocytes Relative: 35 %
Lymphs Abs: 1995 cells/uL (ref 850–3900)
MCH: 28.8 pg (ref 27.0–33.0)
MCHC: 32.7 g/dL (ref 32.0–36.0)
MCV: 88.1 fL (ref 80.0–100.0)
MPV: 10 fL (ref 7.5–12.5)
Monocytes Absolute: 342 cells/uL (ref 200–950)
Monocytes Relative: 6 %
Neutro Abs: 3192 cells/uL (ref 1500–7800)
Neutrophils Relative %: 56 %
Platelets: 259 10*3/uL (ref 140–400)
RBC: 4.69 MIL/uL (ref 3.80–5.10)
RDW: 15.8 % — ABNORMAL HIGH (ref 11.0–15.0)
WBC: 5.7 10*3/uL (ref 3.8–10.8)

## 2016-05-26 LAB — BASIC METABOLIC PANEL
BUN: 12 mg/dL (ref 7–25)
CO2: 25 mmol/L (ref 20–31)
Calcium: 9.6 mg/dL (ref 8.6–10.4)
Chloride: 104 mmol/L (ref 98–110)
Creat: 0.71 mg/dL (ref 0.60–0.93)
Glucose, Bld: 92 mg/dL (ref 65–99)
Potassium: 3.9 mmol/L (ref 3.5–5.3)
Sodium: 141 mmol/L (ref 135–146)

## 2016-05-26 LAB — TSH: TSH: 0.23 mIU/L — ABNORMAL LOW

## 2016-05-26 LAB — APTT: aPTT: 25 s (ref 22–34)

## 2016-05-26 LAB — PROTIME-INR
INR: 1
Prothrombin Time: 10.5 s (ref 9.0–11.5)

## 2016-05-26 MED ORDER — LEVOTHYROXINE SODIUM 125 MCG PO TABS: 125.0000 ug | ORAL_TABLET | Freq: Every day | ORAL | 3 refills | Status: DC

## 2016-05-27 ENCOUNTER — Inpatient Hospital Stay (HOSPITAL_COMMUNITY): Admission: RE | Admit: 2016-05-27 | Payer: Medicare Other | Source: Ambulatory Visit

## 2016-05-27 ENCOUNTER — Ambulatory Visit (HOSPITAL_COMMUNITY)
Admission: RE | Admit: 2016-05-27 | Discharge: 2016-05-27 | Disposition: A | Discharge status: Home / Self Care | Payer: Medicare Other | Source: Ambulatory Visit | Attending: Internal Medicine | Admitting: Internal Medicine

## 2016-05-27 DIAGNOSIS — Z951 Presence of aortocoronary bypass graft: Secondary | ICD-10-CM | POA: Diagnosis not present

## 2016-05-27 DIAGNOSIS — I739 Peripheral vascular disease, unspecified: Secondary | ICD-10-CM

## 2016-05-27 DIAGNOSIS — R938 Abnormal findings on diagnostic imaging of other specified body structures: Secondary | ICD-10-CM | POA: Diagnosis not present

## 2016-05-27 DIAGNOSIS — I10 Essential (primary) hypertension: Secondary | ICD-10-CM | POA: Diagnosis not present

## 2016-05-27 DIAGNOSIS — I2510 Atherosclerotic heart disease of native coronary artery without angina pectoris: Secondary | ICD-10-CM | POA: Insufficient documentation

## 2016-05-27 DIAGNOSIS — I70203 Unspecified atherosclerosis of native arteries of extremities, bilateral legs: Secondary | ICD-10-CM | POA: Diagnosis not present

## 2016-05-27 DIAGNOSIS — Z87891 Personal history of nicotine dependence: Secondary | ICD-10-CM | POA: Diagnosis not present

## 2016-05-27 DIAGNOSIS — E785 Hyperlipidemia, unspecified: Secondary | ICD-10-CM | POA: Insufficient documentation

## 2016-05-27 DIAGNOSIS — I771 Stricture of artery: Secondary | ICD-10-CM | POA: Diagnosis not present

## 2016-06-03 ENCOUNTER — Telehealth: Payer: Self-pay | Admitting: *Deleted

## 2016-06-03 NOTE — Telephone Encounter (Signed)
-----   Message from Lorretta Harp, MD sent at 05/26/2016 10:50 AM EDT ----- Labs okay except for a low TSH. Powered to PCP

## 2016-06-03 NOTE — Telephone Encounter (Signed)
Notes Recorded by Vanessa Ralphs, RN on 06/03/2016 at 9:07 AM EDT Patient notified of results. In talking with her she said she is now having her thyroid manage by Dr Reynold Bowen. She just had extensive labwork drawn and her medication was decreased to 125 mcg daily. She is to have repeat blood work through their office as well. Called and left message with nurse at Dr Baldwin Crown office to call back to verify these details. ------  Notes Recorded by Biagio Borg, MD on 05/26/2016 at 6:46 PM EDT [place] for corinne to let pt know, recent thyroid lab is consistent with mild level of too much thyroid medication  Please decrease the thyroid medication from 150 to 125 mcg per day  Then repeat blood test in 4 wks - TSH and free T4 (I will order) ------  Notes Recorded by Vanessa Ralphs, RN on 05/26/2016 at 1:34 PM EDT No answer. Left message to call back.

## 2016-06-04 NOTE — Telephone Encounter (Signed)
Received incoming call from [facility]. They indeed are handling the patient's thyroid management at this time. They have recently made necessary changes.  Routing to Dr Gwenlyn Found and Dr Jenny Reichmann as Juluis Rainier.

## 2016-06-04 NOTE — Telephone Encounter (Signed)
No answer. Left message with DJ at Dr Baldwin Crown office to call back and let me know that he has addressed patient's thyroid labs and medication as the pt had told me.

## 2016-06-07 ENCOUNTER — Ambulatory Visit (HOSPITAL_COMMUNITY)
Admission: RE | Admit: 2016-06-07 | Discharge: 2016-06-08 | Disposition: A | Discharge status: Home / Self Care | Payer: Medicare Other | Source: Ambulatory Visit | Attending: Cardiovascular Disease | Admitting: Cardiovascular Disease

## 2016-06-07 ENCOUNTER — Encounter (HOSPITAL_COMMUNITY)
Admission: RE | Disposition: A | Discharge status: Home / Self Care | Payer: Self-pay | Source: Ambulatory Visit | Attending: Cardiovascular Disease

## 2016-06-07 ENCOUNTER — Encounter (HOSPITAL_COMMUNITY): Payer: Self-pay | Admitting: General Practice

## 2016-06-07 DIAGNOSIS — I10 Essential (primary) hypertension: Secondary | ICD-10-CM | POA: Diagnosis present

## 2016-06-07 DIAGNOSIS — D649 Anemia, unspecified: Secondary | ICD-10-CM | POA: Diagnosis not present

## 2016-06-07 DIAGNOSIS — Z7982 Long term (current) use of aspirin: Secondary | ICD-10-CM | POA: Insufficient documentation

## 2016-06-07 DIAGNOSIS — E1151 Type 2 diabetes mellitus with diabetic peripheral angiopathy without gangrene: Secondary | ICD-10-CM | POA: Insufficient documentation

## 2016-06-07 DIAGNOSIS — I700 Atherosclerosis of aorta: Secondary | ICD-10-CM | POA: Insufficient documentation

## 2016-06-07 DIAGNOSIS — I70213 Atherosclerosis of native arteries of extremities with intermittent claudication, bilateral legs: Secondary | ICD-10-CM | POA: Diagnosis present

## 2016-06-07 DIAGNOSIS — Z7984 Long term (current) use of oral hypoglycemic drugs: Secondary | ICD-10-CM | POA: Diagnosis not present

## 2016-06-07 DIAGNOSIS — F419 Anxiety disorder, unspecified: Secondary | ICD-10-CM | POA: Diagnosis not present

## 2016-06-07 DIAGNOSIS — I2510 Atherosclerotic heart disease of native coronary artery without angina pectoris: Secondary | ICD-10-CM | POA: Diagnosis not present

## 2016-06-07 DIAGNOSIS — Z951 Presence of aortocoronary bypass graft: Secondary | ICD-10-CM | POA: Insufficient documentation

## 2016-06-07 DIAGNOSIS — I70212 Atherosclerosis of native arteries of extremities with intermittent claudication, left leg: Secondary | ICD-10-CM

## 2016-06-07 DIAGNOSIS — J449 Chronic obstructive pulmonary disease, unspecified: Secondary | ICD-10-CM | POA: Diagnosis not present

## 2016-06-07 DIAGNOSIS — Z87891 Personal history of nicotine dependence: Secondary | ICD-10-CM | POA: Diagnosis not present

## 2016-06-07 DIAGNOSIS — K219 Gastro-esophageal reflux disease without esophagitis: Secondary | ICD-10-CM | POA: Insufficient documentation

## 2016-06-07 DIAGNOSIS — E785 Hyperlipidemia, unspecified: Secondary | ICD-10-CM | POA: Diagnosis not present

## 2016-06-07 DIAGNOSIS — I739 Peripheral vascular disease, unspecified: Secondary | ICD-10-CM

## 2016-06-07 HISTORY — DX: Aneurysm of heart: I25.3

## 2016-06-07 HISTORY — DX: Prediabetes: R73.03

## 2016-06-07 HISTORY — PX: PERIPHERAL VASCULAR CATHETERIZATION: SHX172C

## 2016-06-07 HISTORY — PX: LOWER EXTREMITY ANGIOGRAM: SHX5955

## 2016-06-07 LAB — GLUCOSE, CAPILLARY
Glucose-Capillary: 101 mg/dL — ABNORMAL HIGH (ref 65–99)
Glucose-Capillary: 102 mg/dL — ABNORMAL HIGH (ref 65–99)
Glucose-Capillary: 130 mg/dL — ABNORMAL HIGH (ref 65–99)

## 2016-06-07 LAB — POCT ACTIVATED CLOTTING TIME
Activated Clotting Time: 257 seconds
Activated Clotting Time: 235 seconds
Activated Clotting Time: 191 seconds
Activated Clotting Time: 169 seconds

## 2016-06-07 SURGERY — LOWER EXTREMITY ANGIOGRAPHY
Laterality: Left

## 2016-06-07 MED ORDER — SODIUM CHLORIDE 0.9 % WEIGHT BASED INFUSION: 1.0000 mL/kg/h | INTRAVENOUS | Status: DC

## 2016-06-07 MED ORDER — ASPIRIN EC 81 MG PO TBEC
81.0000 mg | DELAYED_RELEASE_TABLET | Freq: Every day | ORAL | Status: DC
Start: 2016-06-07 — End: 2016-06-07

## 2016-06-07 MED ORDER — HEPARIN (PORCINE) IN NACL 2-0.9 UNIT/ML-% IJ SOLN
INTRAMUSCULAR | Status: AC
  Filled 2016-06-07: qty 1000

## 2016-06-07 MED ORDER — MIDAZOLAM HCL 2 MG/2ML IJ SOLN
INTRAMUSCULAR | Status: DC | PRN
  Administered 2016-06-07: 1 mg via INTRAVENOUS

## 2016-06-07 MED ORDER — TIZANIDINE HCL 4 MG PO TABS
4.0000 mg | ORAL_TABLET | Freq: Every day | ORAL | Status: DC
  Administered 2016-06-07: 4 mg via ORAL
  Filled 2016-06-07: qty 1

## 2016-06-07 MED ORDER — VITAMIN D (ERGOCALCIFEROL) 1.25 MG (50000 UNIT) PO CAPS
50000.0000 [IU] | ORAL_CAPSULE | ORAL | Status: DC
  Administered 2016-06-08: 08:00:00 50000 [IU] via ORAL
  Filled 2016-06-07: qty 1

## 2016-06-07 MED ORDER — MORPHINE SULFATE (PF) 2 MG/ML IV SOLN: 2.0000 mg | INTRAVENOUS | Status: DC | PRN

## 2016-06-07 MED ORDER — AMLODIPINE BESYLATE 10 MG PO TABS
10.0000 mg | ORAL_TABLET | Freq: Every day | ORAL | Status: DC
  Administered 2016-06-08: 12:00:00 10 mg via ORAL
  Filled 2016-06-07: qty 1

## 2016-06-07 MED ORDER — ACETAMINOPHEN 325 MG PO TABS
650.0000 mg | ORAL_TABLET | ORAL | Status: DC | PRN
  Administered 2016-06-07: 650 mg via ORAL
  Filled 2016-06-07: qty 2

## 2016-06-07 MED ORDER — HEPARIN SODIUM (PORCINE) 1000 UNIT/ML IJ SOLN
INTRAMUSCULAR | Status: DC | PRN
  Administered 2016-06-07: 6000 [IU] via INTRAVENOUS

## 2016-06-07 MED ORDER — ASPIRIN EC 81 MG PO TBEC
81.0000 mg | DELAYED_RELEASE_TABLET | Freq: Every day | ORAL | Status: DC
  Administered 2016-06-08: 12:00:00 81 mg via ORAL

## 2016-06-07 MED ORDER — CLOPIDOGREL BISULFATE 300 MG PO TABS
ORAL_TABLET | ORAL | Status: AC
  Filled 2016-06-07: qty 1

## 2016-06-07 MED ORDER — CLOPIDOGREL BISULFATE 75 MG PO TABS
75.0000 mg | ORAL_TABLET | Freq: Every day | ORAL | Status: DC
  Administered 2016-06-08: 75 mg via ORAL

## 2016-06-07 MED ORDER — MIDAZOLAM HCL 2 MG/2ML IJ SOLN
INTRAMUSCULAR | Status: AC
  Filled 2016-06-07: qty 2

## 2016-06-07 MED ORDER — LIDOCAINE HCL (PF) 1 % IJ SOLN
INTRAMUSCULAR | Status: DC | PRN
Start: 2016-06-07 — End: 2016-06-07
  Administered 2016-06-07: 25 mL

## 2016-06-07 MED ORDER — HYDRALAZINE HCL 20 MG/ML IJ SOLN: 10.0000 mg | INTRAMUSCULAR | Status: DC | PRN

## 2016-06-07 MED ORDER — IODIXANOL 320 MG/ML IV SOLN
INTRAVENOUS | Status: DC | PRN
  Administered 2016-06-07: 180 mL via INTRAVENOUS

## 2016-06-07 MED ORDER — ASPIRIN 81 MG PO CHEW: 81.0000 mg | CHEWABLE_TABLET | ORAL | Status: DC

## 2016-06-07 MED ORDER — SODIUM CHLORIDE 0.9 % IV SOLN: INTRAVENOUS | Status: AC

## 2016-06-07 MED ORDER — FENTANYL CITRATE (PF) 100 MCG/2ML IJ SOLN
INTRAMUSCULAR | Status: DC | PRN
  Administered 2016-06-07 (×2): 25 ug via INTRAVENOUS

## 2016-06-07 MED ORDER — LIDOCAINE HCL (PF) 1 % IJ SOLN
INTRAMUSCULAR | Status: AC
  Filled 2016-06-07: qty 30

## 2016-06-07 MED ORDER — ADULT MULTIVITAMIN W/MINERALS CH
1.0000 | ORAL_TABLET | ORAL | Status: DC
  Administered 2016-06-08: 1 via ORAL
  Filled 2016-06-07: qty 1

## 2016-06-07 MED ORDER — LEVOTHYROXINE SODIUM 125 MCG PO TABS
125.0000 ug | ORAL_TABLET | Freq: Every day | ORAL | Status: DC
Start: 2016-06-08 — End: 2016-06-08
  Administered 2016-06-08: 125 ug via ORAL
  Filled 2016-06-07: qty 1

## 2016-06-07 MED ORDER — ONDANSETRON HCL 4 MG/2ML IJ SOLN
4.0000 mg | Freq: Four times a day (QID) | INTRAMUSCULAR | Status: DC | PRN
Start: 2016-06-07 — End: 2016-06-08

## 2016-06-07 MED ORDER — SODIUM CHLORIDE 0.9% FLUSH: 3.0000 mL | INTRAVENOUS | Status: DC | PRN

## 2016-06-07 MED ORDER — HEPARIN (PORCINE) IN NACL 2-0.9 UNIT/ML-% IJ SOLN
INTRAMUSCULAR | Status: DC | PRN
Start: 2016-06-07 — End: 2016-06-07
  Administered 2016-06-07: 1000 mL

## 2016-06-07 MED ORDER — HEPARIN SODIUM (PORCINE) 1000 UNIT/ML IJ SOLN
INTRAMUSCULAR | Status: AC
  Filled 2016-06-07: qty 1

## 2016-06-07 MED ORDER — SODIUM CHLORIDE 0.9 % WEIGHT BASED INFUSION
3.0000 mL/kg/h | INTRAVENOUS | Status: DC
  Administered 2016-06-07: 3 mL/kg/h via INTRAVENOUS

## 2016-06-07 MED ORDER — COENZYME Q10 30 MG PO CAPS: 30.0000 mg | ORAL_CAPSULE | Freq: Every day | ORAL | Status: DC

## 2016-06-07 MED ORDER — CLOPIDOGREL BISULFATE 300 MG PO TABS
ORAL_TABLET | ORAL | Status: DC | PRN
  Administered 2016-06-07: 300 mg via ORAL

## 2016-06-07 MED ORDER — FENTANYL CITRATE (PF) 100 MCG/2ML IJ SOLN
INTRAMUSCULAR | Status: AC
  Filled 2016-06-07: qty 2

## 2016-06-07 MED ORDER — ANGIOPLASTY BOOK
Freq: Once | Status: AC
  Administered 2016-06-08: 06:00:00
  Filled 2016-06-07: qty 1

## 2016-06-07 MED ORDER — HYDROCHLOROTHIAZIDE 25 MG PO TABS
25.0000 mg | ORAL_TABLET | Freq: Every day | ORAL | Status: DC
  Filled 2016-06-07: qty 1

## 2016-06-07 SURGICAL SUPPLY — 16 items
BALLN IN.PACT DCB 5X80 (BALLOONS) ×3
CATH ANGIO 5F PIGTAIL 65CM (CATHETERS) ×1 IMPLANT
CATH CROSS OVER TEMPO 5F (CATHETERS) ×2 IMPLANT
CATH HAWKONE LS STANDARD TIP (CATHETERS) ×3
CATH HAWKONE LS STD TIP (CATHETERS) ×1 IMPLANT
DCB IN.PACT 5X80 (BALLOONS) ×1 IMPLANT
DEVICE SPIDERFX EMB PROT 6MM (WIRE) ×1 IMPLANT
KIT PV (KITS) ×3 IMPLANT
SHEATH HIGHFLEX ANSEL 7FR 55CM (SHEATH) ×1 IMPLANT
SHEATH PINNACLE 5F 10CM (SHEATH) ×1 IMPLANT
SHEATH PINNACLE 7F 10CM (SHEATH) ×1 IMPLANT
SYRINGE MEDRAD AVANTA MACH 7 (SYRINGE) ×1 IMPLANT
TRANSDUCER W/STOPCOCK (MISCELLANEOUS) ×3 IMPLANT
TRAY PV CATH (CUSTOM PROCEDURE TRAY) ×3 IMPLANT
WIRE HITORQ VERSACORE ST 145CM (WIRE) ×1 IMPLANT
WIRE SPARTACORE .014X300CM (WIRE) ×1 IMPLANT

## 2016-06-07 NOTE — Interval H&P Note (Signed)
History and Physical Interval Note:  06/07/2016 10:50 AM  [name]  has presented today for surgery, with the diagnosis of claudication  The various methods of treatment have been discussed with the patient and family. After consideration of risks, benefits and other options for treatment, the patient has consented to  Procedure(s): Lower Extremity Angiography (N/A) as a surgical intervention .  The patient's history has been reviewed, patient examined, no change in status, stable for surgery.  I have reviewed the patient's chart and labs.  Questions were answered to the patient's satisfaction.     Quay Burow

## 2016-06-07 NOTE — Care Management Note (Signed)
Case Management Note  Patient Details  Name: [name] MRN: [id number] Date of Birth: May 07, 1946  Subjective/Objective:  S/p pv intervention, will be on plavix , NCM will cont to follow for dc needs.                   Action/Plan:   Expected Discharge Date:                  Expected Discharge Plan:  Home/Self Care  In-House Referral:     Discharge planning Services  CM Consult  Post Acute Care Choice:    Choice offered to:     DME Arranged:    DME Agency:     HH Arranged:    HH Agency:     Status of Service:  In process, will continue to follow  If discussed at Long Length of Stay Meetings, dates discussed:    Additional Comments:  Zenon Mayo, RN 06/07/2016, 3:12 PM

## 2016-06-07 NOTE — H&P (View-Only) (Signed)
05/19/2016 [name]   1945/10/20  [id number]  Primary Physician Alicia Cower, MD Primary Cardiologist: Alicia Harp MD [name]  HPI:    [name] is a 70 year old African American female patient of [name] referred for peripheral vascular evaluation. She was seen by [name]  orthopedic surgeon, who referred her here. I last saw her in Alicia office 05/29/14. Her past history is remarkable for ischemic heart disease status post coronary bypass grafting in March 2012 of Alicia LIMA to LAD, vein to Alicia diagonal branch, obtuse marginal branch and a vein to acute marginal branch. She has normal LV function by 2-D echo. Further problems include hypertension, and hyperlipidemia. She complains of right lower extremity claudication and had seen [name] in Alicia past who thought that her pain was arthritic in nature. Lower extremity arterial Doppler studies performed 07/07/14 revealed a right ABI 0.43 with an occluded distal right SFA and popliteal artery, left ABI 0.73 with a high-frequency signal in Alicia mid left SFA. I performed angiography on her 753/15 revealing an occluded left SFA with one vessel runoff via Alicia peroneal. I performed [place]bo[place] directional atherectomy followed by PTA using drug-eluting balloon with excellent angiographic result. Her followup Doppler revealed an increase in her right ABI from 0.4 to .7 with resolution of her claudication symptoms. Since I saw her over 2 years ago she developed claudication in her left leg now. I suspect her pubis a documented lesions in her left external leg artery and SFA have progressed.   Current Outpatient Prescriptions  Medication Sig Dispense Refill  . amLODipine (NORVASC) 10 MG tablet TAKE ONE TABLET BY MOUTH ONCE DAILY 90 tablet 0  . aspirin 325 MG tablet Take 325 mg by mouth daily.    . Calcium Carb-Cholecalciferol (CALCIUM 1000 + D PO) Take 1 tablet by mouth daily at 2 PM daily at 2 PM.     . co-enzyme Q-10 30  MG capsule Take 30 mg by mouth daily as needed.     . hydrochlorothiazide (HYDRODIURIL) 25 MG tablet Take 25 mg by mouth daily after lunch. Reported on 09/24/2015    . levothyroxine (SYNTHROID, LEVOTHROID) 150 MCG tablet Take 1 tablet (150 mcg total) by mouth daily. 90 tablet 3  . Multiple Vitamin (MULTIVITAMIN WITH MINERALS) TABS tablet Take 1 tablet by mouth every morning. Centrum    . pantoprazole (PROTONIX) 40 MG tablet Take 1 tablet (40 mg total) by mouth 2 (two) times daily. 60 tablet 2  . Pravastatin Sodium (PRAVACHOL PO) Take by mouth.    Marland Kitchen PRENATAL VITAMINS PO Take 1 capsule by mouth every morning.     Marland Kitchen tiZANidine (ZANAFLEX) 4 MG tablet Take 1 tablet (4 mg total) by mouth at bedtime. 30 tablet 0  . triamcinolone cream (KENALOG) 0.1 % Apply 1 application topically 2 (two) times daily. 30 g 0  . metFORMIN (GLUCOPHAGE) 500 MG tablet Take 500 mg by mouth daily with breakfast.     No current facility-administered medications for this visit.    Facility-Administered Medications Ordered in Other Visits  Medication Dose Route Frequency Provider Last Rate Last Dose  . tranexamic acid (CYKLOKAPRON) 2,000 mg in sodium chloride 0.9 % 50 mL Topical Application  7,672 mg Topical Once [place] [employer], PA-C        Allergies  Allergen Reactions  . Lipitor [Atorvastatin] Other (See Comments)    Muscle aches    Social History   Social History  . Marital status:  Single    Spouse name: N/A  . Number of children: 2  . Years of education: 14   Occupational History  . Retired Retired   Social History Main Topics  . Smoking status: Former Smoker    Packs/day: 0.80    Years: 45.00    Types: Cigarettes    Quit date: 08/23/2009  . Smokeless tobacco: Never Used  . Alcohol use No     Comment: WINE OCC.  . Drug use: No  . Sexual activity: Not Currently   Other Topics Concern  . Not on file   Social History Narrative  . No narrative on file     Review of Systems: General: negative for  chills, fever, night sweats or weight changes.  Cardiovascular: negative for chest pain, dyspnea on exertion, edema, orthopnea, palpitations, paroxysmal nocturnal dyspnea or shortness of breath Dermatological: negative for rash Respiratory: negative for cough or wheezing Urologic: negative for hematuria Abdominal: negative for nausea, vomiting, diarrhea, bright red blood per rectum, melena, or hematemesis Neurologic: negative for visual changes, syncope, or dizziness All other systems reviewed and are otherwise negative except as noted above.    Blood pressure (!) 144/86, pulse 78, height '5\' 3"'$  (1.6 m), weight 133 lb (60.3 kg).  General appearance: alert and no distress Neck: no adenopathy, no carotid bruit, no JVD, supple, symmetrical, trachea midline and thyroid not enlarged, symmetric, no tenderness/mass/nodules Lungs: clear to auscultation bilaterally Heart: regular rate and rhythm, S1, S2 normal, no murmur, click, rub or gallop Extremities: extremities normal, atraumatic, no cyanosis or edema  EKG normal sinus rhythm at 78 with septal Q waves and nonspecific ST and T-wave changes. I personally reviewed this EKG  ASSESSMENT AND PLAN:   PAD (peripheral artery disease) Alicia. Christoffel has peripheral vascular disease. I performed one directional atherectomy followed by drug eluding balloon angioplasty on her left SFA chronic total occlusion 03/21/14. She did have one vessel run off below her knee with a patent peroneal artery. Her claudication resolved at that time her Dopplers improved as well. Her most recent Dopplers performed 09/24/14 revealed a right ABI of 0.66 with a patent right SFA and a left ABI 0.5 to high-frequency signal in her mid left SFA. This should be noted that she had one-vessel runoff on Alicia left at that time was 70% left external iliac artery stenosis 80% mid left SFA. Over Alicia last several months she's noticed increasing left lower extremity claudication. I suspect she's had  progression of disease on that side. I'm going to repeat her lower extremity arterial Doppler studies and arrange for her to undergo angiography and potential endovascular therapy for lifestyle limiting claudication.      Alicia Harp MD FACP,FACC,FAHA, [facility] 05/19/2016 11:41 AM

## 2016-06-07 NOTE — Progress Notes (Signed)
Site area: right groin  Site Prior to Removal:  Level 0  Pressure Applied For 25 MINUTES    Minutes Beginning at 1405  Manual:   Yes.    Patient Status During Pull:  Patient remains A&O by four. Tolerated removal well.   Post Pull Groin Site:  Level 0  Post Pull Instructions Given:  Yes.    Post Pull Pulses Present:  Yes.    Dressing Applied:  Yes.    Comments:  Patient remains alert and oriented by four. Tolerated procedure well. No hematoma, no bleeding noted. No pain voiced. Dressing applied and is in place C/D/I.

## 2016-06-08 ENCOUNTER — Other Ambulatory Visit: Payer: Self-pay | Admitting: Physician Assistant

## 2016-06-08 ENCOUNTER — Encounter (HOSPITAL_COMMUNITY): Payer: Self-pay | Admitting: Physician Assistant

## 2016-06-08 DIAGNOSIS — I739 Peripheral vascular disease, unspecified: Secondary | ICD-10-CM

## 2016-06-08 DIAGNOSIS — I2510 Atherosclerotic heart disease of native coronary artery without angina pectoris: Secondary | ICD-10-CM

## 2016-06-08 DIAGNOSIS — I70213 Atherosclerosis of native arteries of extremities with intermittent claudication, bilateral legs: Secondary | ICD-10-CM | POA: Diagnosis not present

## 2016-06-08 LAB — CBC
HCT: 39.1 % (ref 36.0–46.0)
Hemoglobin: 13 g/dL (ref 12.0–15.0)
MCH: 28.8 pg (ref 26.0–34.0)
MCHC: 33.2 g/dL (ref 30.0–36.0)
MCV: 86.7 fL (ref 78.0–100.0)
Platelets: 216 10*3/uL (ref 150–400)
RBC: 4.51 MIL/uL (ref 3.87–5.11)
RDW: 14.4 % (ref 11.5–15.5)
WBC: 5.4 10*3/uL (ref 4.0–10.5)

## 2016-06-08 LAB — BASIC METABOLIC PANEL
Anion gap: 7 (ref 5–15)
BUN: 8 mg/dL (ref 6–20)
CO2: 24 mmol/L (ref 22–32)
Calcium: 8.8 mg/dL — ABNORMAL LOW (ref 8.9–10.3)
Chloride: 110 mmol/L (ref 101–111)
Creatinine, Ser: 0.69 mg/dL (ref 0.44–1.00)
GFR calc Af Amer: >60 mL/min (ref >60)
GFR calc non Af Amer: >60 mL/min (ref >60)
Glucose, Bld: 112 mg/dL — ABNORMAL HIGH (ref 65–99)
Potassium: 4 mmol/L (ref 3.5–5.1)
Sodium: 141 mmol/L (ref 135–145)

## 2016-06-08 LAB — HEPATIC FUNCTION PANEL
ALT: 14 U/L (ref 14–54)
AST: 19 U/L (ref 15–41)
Albumin: 3.1 g/dL — ABNORMAL LOW (ref 3.5–5.0)
Alkaline Phosphatase: 97 U/L (ref 38–126)
Bilirubin, Direct: <0.1 mg/dL — ABNORMAL LOW (ref 0.1–0.5)
Total Bilirubin: 0.6 mg/dL (ref 0.3–1.2)
Total Protein: 5.7 g/dL — ABNORMAL LOW (ref 6.5–8.1)

## 2016-06-08 MED ORDER — CLOPIDOGREL BISULFATE 75 MG PO TABS: 75.0000 mg | ORAL_TABLET | Freq: Every day | ORAL | 6 refills | Status: DC

## 2016-06-08 MED ORDER — PRAVASTATIN SODIUM 40 MG PO TABS: 40.0000 mg | ORAL_TABLET | Freq: Every evening | ORAL | 6 refills | Status: DC

## 2016-06-08 MED ORDER — PRAVASTATIN SODIUM 40 MG PO TABS: 40.0000 mg | ORAL_TABLET | Freq: Every day | ORAL | Status: DC

## 2016-06-08 NOTE — Discharge Summary (Signed)
Discharge Summary    Patient ID: [name],  MRN: [id number], DOB/AGE: Aug 12, 1946 70 y.o.  Admit date: 06/07/2016 Discharge date: 06/08/2016  Primary Care Provider: Sheela Stack Primary Cardiologist: Dr. Crensaw/PV-Berry  Discharge Diagnoses    Principal Problem:   PAD (peripheral artery disease) ([place]) Active Problems:   Claudication ([place])   Hyperlipidemia LDL goal <70   Essential hypertension   CAD in native artery    Diagnostic Studies/Procedures    1. PV angio, see report _____________   History of Present Illness & Hospital Course    [name] is a 42F with CAD s/p CABG 2012, HTN, hyperlipidemia, GERD, DM, RSD, PAD (PTA to R SFA 2015), cervical disc disease, h/o rheumatic fever, anxiety, anemia, arthritis, former tobacco use who presented to [facility] for planned PV angio for progressive claudication. This was done 06/07/16 with resultant [facility] one directional atherectomy mid to distal left SFA, drug eluting balloon angioplasty left SFA. She tolerated this procedure well. She was started on Plavix. She was agreeable to restarting pravastatin - she was previously intolerant of atorvastatin, but states she just stopped pravastatin in the past because she felt like she had too many pills in her bag. Importance of med compliance reinforced. If the patient is tolerating statin at time of follow-up appointment, would consider rechecking liver function/lipid panel in 6-8 weeks. Baseline LFTs pending at time of DC. I have sent a message to our [facility] office's scheduler requesting f/u LE ABI/duplex in 1 week as well as a f/u in 2-3 weeks with Dr. Gwenlyn Found, and our office will call the patient with this information. Dr. Angelena Form has seen and examined the patient today and feels she is stable for discharge.  The patient reported she was no longer taken Protonix or triamcinolone so these were removed from her list.   _____________  Discharge Vitals Blood pressure 128/68,  pulse 93, temperature 97.9 F (36.6 C), temperature source Oral, resp. rate 13, height '5\' 3"'$  (1.6 m), weight 130 lb 15.3 oz (59.4 kg), SpO2 99 %.  Filed Weights   06/07/16 0900 06/08/16 0452  Weight: 132 lb (59.9 kg) 130 lb 15.3 oz (59.4 kg)    Labs & Radiologic Studies    CBC  Recent Labs  06/08/16 0425  WBC 5.4  HGB 13.0  HCT 39.1  MCV 86.7  PLT 010   Basic Metabolic Panel  Recent Labs  06/08/16 0425  NA 141  K 4.0  CL 110  CO2 24  GLUCOSE 112*  BUN 8  CREATININE 0.69  CALCIUM 8.8*   ____________  Dg Chest 2 View  Result Date: 05/25/2016 CLINICAL DATA:  Preoperative examination. History of peripheral vascular disease, coronary artery disease status post CABG, emphysema, former smoker. EXAM: CHEST  2 VIEW COMPARISON:  CT scan of the chest dated October 29, 2015 FINDINGS: The lungs are hyperinflated with hemidiaphragm flattening. The heart and pulmonary vascularity are normal. The mediastinum is normal in width. There is calcification in the wall of the thoracic aorta. The sternal wires are intact. The retrosternal soft tissues are normal. Gentle S-shaped thoracolumbar scoliosis. IMPRESSION: COPD.  No acute cardiopulmonary abnormality. Aortic atherosclerosis. Electronically Signed   By: David  Martinique M.D.   On: 05/25/2016 15:39   Disposition   Pt is being discharged home today in good condition.  Follow-up Plans & Appointments    Follow-up Information    Quay Burow, MD .   Specialties:  Cardiology, Radiology Why:  The office will call you for  your follow-up leg ultrasound and appointment. Call the office if you have not heard back within 3 days. Contact information: [address] [place] [address] [phone]          Discharge Instructions    Diet - low sodium heart healthy    Complete by:  As directed    Increase activity slowly    Complete by:  As directed    No driving for 2 days. No lifting over 5 lbs for 1 week. No sexual  activity for 1 week. Keep procedure site clean & dry. If you notice increased pain, swelling, bleeding or pus, call/return!  You may shower, but no soaking baths/hot tubs/pools for 1 week.      Discharge Medications     Medication List    STOP taking these medications   pantoprazole 40 MG tablet - pt not taking PTA Commonly known as:  PROTONIX   triamcinolone cream 0.1 % - pt not taking PTA Commonly known as:  KENALOG     TAKE these medications   amLODipine 10 MG tablet Commonly known as:  NORVASC TAKE ONE TABLET BY MOUTH ONCE DAILY   aspirin EC 81 MG tablet Take 81 mg by mouth daily.   CALCIUM 1000 + D PO Take 1 tablet by mouth daily at 2 PM daily at 2 PM.   clopidogrel 75 MG tablet Commonly known as:  PLAVIX Take 1 tablet (75 mg total) by mouth daily.   co-enzyme Q-10 30 MG capsule Take 30 mg by mouth daily.   hydrochlorothiazide 25 MG tablet Commonly known as:  HYDRODIURIL Take 25 mg by mouth daily after lunch. Reported on 09/24/2015   levothyroxine 125 MCG tablet Commonly known as:  SYNTHROID, LEVOTHROID Take 1 tablet (125 mcg total) by mouth daily.   multivitamin with minerals Tabs tablet Take 1 tablet by mouth every morning. Centrum   pravastatin 40 MG tablet Commonly known as:  PRAVACHOL Take 1 tablet (40 mg total) by mouth every evening.   PRENATAL VITAMINS PO Take 1 capsule by mouth every morning.   ranitidine 75 MG tablet Commonly known as:  ZANTAC Take 150 mg by mouth daily as needed for heartburn.   tiZANidine 4 MG tablet Commonly known as:  ZANAFLEX Take 1 tablet (4 mg total) by mouth at bedtime.   Vitamin D (Ergocalciferol) 50000 units Caps capsule Commonly known as:  DRISDOL Take 50,000 Units by mouth every Tuesday.        Allergies:  Allergies  Allergen Reactions  . Lipitor [Atorvastatin] Other (See Comments)    Muscle aches    Outstanding Labs/Studies   NA  Duration of Discharge Encounter   Greater than 30 minutes  including physician time.  Signed, Nedra Hai Dunn PA-C 06/08/2016, 9:51 AM   I have personally seen and examined this patient. I agree with the assessment and plan as outlined above. See my full note this am. cdm

## 2016-06-08 NOTE — Progress Notes (Signed)
Patient Name: [name] Date of Encounter: 06/08/2016  Primary Cardiologist: [facility] Problem List     Active Problems:   Hyperlipidemia LDL goal <70   Essential hypertension   PAD (peripheral artery disease) ([facility])   Claudication ([facility])    Subjective   Feeling well, no complaints.  Inpatient Medications    . amLODipine  10 mg Oral Daily  . aspirin EC  81 mg Oral Daily  . clopidogrel  75 mg Oral Q breakfast  . hydrochlorothiazide  25 mg Oral QPC lunch  . levothyroxine  125 mcg Oral QAC breakfast  . multivitamin with minerals  1 tablet Oral [id number]  . tiZANidine  4 mg Oral QHS  . Vitamin D (Ergocalciferol)  50,000 Units Oral Q Tue    Vital Signs    Vitals:   06/07/16 1938 06/07/16 2000 06/08/16 0452 06/08/16 0734  BP: 125/82 137/72 133/72 128/68  Pulse: 92 92 68 93  Resp: (!) 25 (!) '22 17 13  '$ Temp: 98.1 F (36.7 C)  98 F (36.7 C) 97.9 F (36.6 C)  TempSrc: Oral  Oral Oral  SpO2: 94% 95% 98% 99%  Weight:   130 lb 15.3 oz (59.4 kg)   Height:        Intake/Output Summary (Last 24 hours) at 06/08/16 0841 Last data filed at 06/08/16 0815  Gross per 24 hour  Intake             1245 ml  Output             2150 ml  Net             -905 ml   Filed Weights   06/07/16 0900 06/08/16 0452  Weight: 132 lb (59.9 kg) 130 lb 15.3 oz (59.4 kg)    Physical Exam    General: Well developed, well nourished AAF, in no acute distress. HEENT: Normocephalic, atraumatic, sclera non-icteric, no xanthomas, nares are without discharge. Neck: Negative for carotid bruits. JVP not elevated. Lungs: Clear bilaterally to auscultation without wheezes, rales, or rhonchi. Breathing is unlabored. Cardiac: RRR S1 S2 without murmurs, rubs, or gallops.  Abdomen: Soft, non-tender, non-distended with normoactive bowel sounds. No rebound/guarding. Extremities: No clubbing or cyanosis. No edema. Distal pedal pulses are 2+ and equal bilaterally. Skin: Warm and dry, no  significant rash. Right groin cath site without hematoma, ecchymosis, or bruit. Neuro: Alert and oriented X 3. Strength and sensation in tact. Psych:  Responds to questions appropriately with a normal affect.  Labs    CBC  Recent Labs  06/08/16 0425  WBC 5.4  HGB 13.0  HCT 39.1  MCV 86.7  PLT 932   Basic Metabolic Panel  Recent Labs  06/08/16 0425  NA 141  K 4.0  CL 110  CO2 24  GLUCOSE 112*  BUN 8  CREATININE 0.69  CALCIUM 8.8*   Liver Function Tests No results for input(s): AST, ALT, ALKPHOS, BILITOT, PROT, ALBUMIN in the last 72 hours. No results for input(s): LIPASE, AMYLASE in the last 72 hours. Cardiac Enzymes No results for input(s): CKTOTAL, CKMB, CKMBINDEX, TROPONINI in the last 72 hours. BNP Invalid input(s): POCBNP D-Dimer No results for input(s): DDIMER in the last 72 hours. Hemoglobin A1C No results for input(s): HGBA1C in the last 72 hours. Fasting Lipid Panel No results for input(s): CHOL, HDL, LDLCALC, TRIG, CHOLHDL, LDLDIRECT in the last 72 hours. Thyroid Function Tests No results for input(s): TSH, T4TOTAL, T3FREE, THYROIDAB in the last 72 hours.  Invalid input(s): [place]  Telemetry   NSR  Radiology  Dg Chest 2 View  Result Date: 05/25/2016 CLINICAL DATA:  Preoperative examination. History of peripheral vascular disease, coronary artery disease status post CABG, emphysema, former smoker. EXAM: CHEST  2 VIEW COMPARISON:  CT scan of the chest dated October 29, 2015 FINDINGS: The lungs are hyperinflated with hemidiaphragm flattening. The heart and pulmonary vascularity are normal. The mediastinum is normal in width. There is calcification in the wall of the thoracic aorta. The sternal wires are intact. The retrosternal soft tissues are normal. Gentle S-shaped thoracolumbar scoliosis. IMPRESSION: COPD.  No acute cardiopulmonary abnormality. Aortic atherosclerosis. Electronically Signed   By: David  Martinique M.D.   On: 05/25/2016 15:39     Patient  Profile     78F with CAD s/p CABG 2012, HTN, hyperlipidemia, GERD, DM, RSD, PAD (PTA to R SFA 2015), cervical disc disease, h/o rheumatic fever, anxiety, anemia, arthritis, former tobacco use who presented to [facility] for planned PV angio for progressive claudication. This was done 06/07/16 with resultant [facility] one directional atherectomy mid to distal left SFA, drug eluting balloon angioplasty left SFA.   Assessment & Plan    1. PAD - stable post PV. Continue aspirin and Plavix. Will need LE PV duplex and f/u 2-3 weeks with Dr. Gwenlyn Found.  2. HTN - controlled.  3. Hyperlipidemia - will review statin with MD.  4. CAD - stable, no angina.  Signed, Charlie Pitter, PA-C  06/08/2016, 8:41 AM   I have personally seen and examined this patient with Melina Copa, PA-C. I agree with the assessment and plan as outlined above. She is doing well post PV procedure. She is on ASA and Plavix. Will restart statin. Will d/c home today and follow up as planned with Dr. Gwenlyn Found with arterial dopplers.   Lauree Chandler 06/08/2016 8:51 AM

## 2016-06-16 ENCOUNTER — Telehealth: Payer: Self-pay | Admitting: Cardiovascular Disease

## 2016-06-16 NOTE — Telephone Encounter (Signed)
Returned call, discussed w patient. Pt notes she has noticeable bruises on arms. This is new since starting the plavix. We discussed potential for easier/increased risk of bruising on this medication. Discussed S&S of unusual bleeding, which she denies.  We also discussed lump at cath entry site. Notes site is not painful, nontender, no evidence of swelling/inflammation. She states site is well healed, no evidence of drainage or bruising at location.  I advised patient to call immediately should any S&S of infection occur, and we discussed these to her comfort. Pt aware to call if new concerns. She voiced thanks for call and understanding of instruction.

## 2016-06-16 NOTE — Telephone Encounter (Signed)
Pt had Cath last Tuesday.She now has a lump where incision is and blue and black bruises on her right and left arm.Please call to advise.

## 2016-06-22 ENCOUNTER — Telehealth: Payer: Self-pay | Admitting: Cardiovascular Disease

## 2016-06-22 NOTE — Telephone Encounter (Signed)
Closed encounter °

## 2016-06-24 ENCOUNTER — Ambulatory Visit (HOSPITAL_COMMUNITY)
Admission: RE | Admit: 2016-06-24 | Discharge: 2016-06-24 | Disposition: A | Discharge status: Home / Self Care | Payer: Medicare Other | Source: Ambulatory Visit | Attending: Cardiology | Admitting: Cardiology

## 2016-06-24 DIAGNOSIS — I2510 Atherosclerotic heart disease of native coronary artery without angina pectoris: Secondary | ICD-10-CM | POA: Insufficient documentation

## 2016-06-24 DIAGNOSIS — I10 Essential (primary) hypertension: Secondary | ICD-10-CM | POA: Diagnosis not present

## 2016-06-24 DIAGNOSIS — I739 Peripheral vascular disease, unspecified: Secondary | ICD-10-CM

## 2016-06-24 DIAGNOSIS — Z951 Presence of aortocoronary bypass graft: Secondary | ICD-10-CM | POA: Insufficient documentation

## 2016-06-24 DIAGNOSIS — R938 Abnormal findings on diagnostic imaging of other specified body structures: Secondary | ICD-10-CM | POA: Diagnosis not present

## 2016-06-24 DIAGNOSIS — Z87891 Personal history of nicotine dependence: Secondary | ICD-10-CM | POA: Insufficient documentation

## 2016-06-24 DIAGNOSIS — E785 Hyperlipidemia, unspecified: Secondary | ICD-10-CM | POA: Diagnosis not present

## 2016-06-29 ENCOUNTER — Telehealth: Payer: Self-pay | Admitting: Cardiovascular Disease

## 2016-06-29 DIAGNOSIS — I739 Peripheral vascular disease, unspecified: Secondary | ICD-10-CM

## 2016-06-29 NOTE — Telephone Encounter (Signed)
-----   Message from Lorretta Harp, MD sent at 06/28/2016  2:32 PM EST ----- Improved right ABI post intervention. Repeat 6 months

## 2016-07-06 ENCOUNTER — Ambulatory Visit (INDEPENDENT_AMBULATORY_CARE_PROVIDER_SITE_OTHER): Payer: Medicare Other | Admitting: Cardiovascular Disease

## 2016-07-06 ENCOUNTER — Encounter: Payer: Self-pay | Admitting: Cardiovascular Disease

## 2016-07-06 DIAGNOSIS — I739 Peripheral vascular disease, unspecified: Secondary | ICD-10-CM

## 2016-07-06 NOTE — Progress Notes (Signed)
07/06/2016 [name]   10-26-1945  [id number]  Primary Physician Alicia Stack, MD Primary Cardiologist: Alicia Harp MD [name]  HPI:  Ms [name] is a 70 year old African American female patient of Dr. Jacalyn [name] referred for peripheral vascular evaluation. She was seen by Dr. Gladstone [name] orthopedic surgeon, who referred her here. I last saw her in the office 05/19/16. Her past history is remarkable for ischemic heart disease status post coronary bypass grafting in March 2012 of the LIMA to LAD, vein to the diagonal branch, obtuse marginal branch and a vein to acute marginal branch. She has normal LV function by 2-D echo. Further problems include hypertension, and hyperlipidemia. She complains of right lower extremity claudication and had seen Dr. Fletcher [name] in the past who thought that her pain was arthritic in nature. Lower extremity arterial Doppler studies performed 07/07/14 revealed a right ABI 0.43 with an occluded distal right SFA and popliteal artery, left ABI 0.73 with a high-frequency signal in the mid left SFA. I performed angiography on her 753/15 revealing an occluded left SFA with one vessel runoff via the peroneal. I performed TurboHawk directional atherectomy followed by PTA using drug-eluting balloon with excellent angiographic result. Her followup Doppler revealed an increase in her right ABI from 0.4 to .7 with resolution of her claudication symptoms. Since I saw her over 2 years ago she developed claudication in her left leg now. I suspected that her previously  documented lesions in her left external leg artery and SFA have progressed. She underwent angiography by myself 06/07/16 revealing 80% segmental mid left SFA stenosis. I performed [facility] 1 directional atherectomy followed by drug-eluting balloon into plasty. She will vessel runoff via peroneal. She did have 50% distal left common iliac artery stenosis without a gradient. Her follow-up lower extremity  Doppler studies performed 06/24/16 were markedly improved ABI 1.04.    Current Outpatient Prescriptions  Medication Sig Dispense Refill  . amLODipine (NORVASC) 10 MG tablet TAKE ONE TABLET BY MOUTH ONCE DAILY 90 tablet 0  . aspirin EC 81 MG tablet Take 81 mg by mouth daily.    . Calcium Carb-Cholecalciferol (CALCIUM 1000 + D PO) Take 1 tablet by mouth daily at 2 PM daily at 2 PM.     . clopidogrel (PLAVIX) 75 MG tablet Take 1 tablet (75 mg total) by mouth daily. 30 tablet 6  . co-enzyme Q-10 30 MG capsule Take 30 mg by mouth daily.     . hydrochlorothiazide (HYDRODIURIL) 25 MG tablet Take 25 mg by mouth daily after lunch. Reported on 09/24/2015    . levothyroxine (SYNTHROID, LEVOTHROID) 125 MCG tablet Take 1 tablet (125 mcg total) by mouth daily. 90 tablet 3  . Multiple Vitamin (MULTIVITAMIN WITH MINERALS) TABS tablet Take 1 tablet by mouth every morning. Centrum    . pravastatin (PRAVACHOL) 40 MG tablet Take 1 tablet (40 mg total) by mouth every evening. 30 tablet 6  . PRENATAL VITAMINS PO Take 1 capsule by mouth every morning.     . ranitidine (ZANTAC) 75 MG tablet Take 150 mg by mouth daily as needed for heartburn.    Marland Kitchen tiZANidine (ZANAFLEX) 4 MG tablet Take 1 tablet (4 mg total) by mouth at bedtime. 30 tablet 0  . Vitamin D, Ergocalciferol, (DRISDOL) 50000 units CAPS capsule Take 50,000 Units by mouth every Tuesday.     No current facility-administered medications for this visit.    Facility-Administered Medications Ordered in Other Visits  Medication Dose Route Frequency Provider  Last Rate Last Dose  . tranexamic acid (CYKLOKAPRON) 2,000 mg in sodium chloride 0.9 % 50 mL Topical Application  5,670 mg Topical Once [employer], PA-C        Allergies  Allergen Reactions  . Lipitor [Atorvastatin] Other (See Comments)    Muscle aches    Social History   Social History  . Marital status: Single    Spouse name: N/A  . Number of children: 2  . Years of education: 14    Occupational History  . Retired Retired   Social History Main Topics  . Smoking status: Former Smoker    Packs/day: 0.80    Years: 45.00    Types: Cigarettes    Quit date: 08/23/2009  . Smokeless tobacco: Never Used  . Alcohol use No     Comment: WINE OCC.  . Drug use: No  . Sexual activity: Not Currently   Other Topics Concern  . Not on file   Social History Narrative  . No narrative on file     Review of Systems: General: negative for chills, fever, night sweats or weight changes.  Cardiovascular: negative for chest pain, dyspnea on exertion, edema, orthopnea, palpitations, paroxysmal nocturnal dyspnea or shortness of breath Dermatological: negative for rash Respiratory: negative for cough or wheezing Urologic: negative for hematuria Abdominal: negative for nausea, vomiting, diarrhea, bright red blood per rectum, melena, or hematemesis Neurologic: negative for visual changes, syncope, or dizziness All other systems reviewed and are otherwise negative except as noted above.    Blood pressure 128/76, pulse 90, height '5\' 3"'$  (1.6 m), weight 130 lb (59 kg).  General appearance: alert and no distress Neck: no adenopathy, no carotid bruit, no JVD, supple, symmetrical, trachea midline and thyroid not enlarged, symmetric, no tenderness/mass/nodules Lungs: clear to auscultation bilaterally Heart: regular rate and rhythm, S1, S2 normal, no murmur, click, rub or gallop Extremities: extremities normal, atraumatic, no cyanosis or edema  EKG not performed today  ASSESSMENT AND PLAN:   PVD (peripheral vascular disease) ([place]) This provided recently underwent left SFA Hawk 1 directional atherectomy followed by drug-eluting balloon angioplasty 06/07/16. She did have a 50% distal left common iliac artery stenosis with no gradient and 1 vessel runoff via peroneal. Her symptoms of claudication has improved as have her Doppler studies. She is complaining of nausea potentially related to  her Plavix. She's been on this from left ventricle comfortable discontinuing this at this time. We will get follow-up lower extremity Doppler studies in 6 months and I will see her back in one year for follow-up.      Alicia Harp MD FACP,FACC,FAHA, [facility] 07/06/2016 11:15 AM

## 2016-07-06 NOTE — Patient Instructions (Signed)
Medication Instructions: OK to STOP Plavix due to nausea.   Testing/Procedures: Your physician has requested that you have a lower extremity arterial duplex in 6 months. During this test, ultrasound is used to evaluate arterial blood flow in the legs. Allow one hour for this exam. There are no restrictions or special instructions.  Your physician has requested that you have an ankle brachial index (ABI) in 6 months. During this test an ultrasound and blood pressure cuff are used to evaluate the arteries that supply the arms and legs with blood. Allow thirty minutes for this exam. There are no restrictions or special instructions.   Follow-Up: Your physician recommends that you schedule a follow-up appointment in: after testing.  If you need a refill on your cardiac medications before your next appointment, please call your pharmacy.

## 2016-07-06 NOTE — Assessment & Plan Note (Signed)
This provided recently underwent left SFA Hawk 1 directional atherectomy followed by drug-eluting balloon angioplasty 06/07/16. She did have a 50% distal left common iliac artery stenosis with no gradient and 1 vessel runoff via peroneal. Her symptoms of claudication has improved as have her Doppler studies. She is complaining of nausea potentially related to her Plavix. She's been on this from left ventricle comfortable discontinuing this at this time. We will get follow-up lower extremity Doppler studies in 6 months and I will see her back in one year for follow-up.

## 2016-07-28 ENCOUNTER — Encounter (HOSPITAL_COMMUNITY): Payer: Self-pay | Admitting: Emergency Medicine

## 2016-07-28 ENCOUNTER — Ambulatory Visit (HOSPITAL_COMMUNITY)
Admission: EM | Admit: 2016-07-28 | Discharge: 2016-07-28 | Disposition: A | Discharge status: Home / Self Care | Payer: Medicare Other | Attending: Family Medicine | Admitting: Family Medicine

## 2016-07-28 ENCOUNTER — Ambulatory Visit (INDEPENDENT_AMBULATORY_CARE_PROVIDER_SITE_OTHER): Payer: Medicare Other

## 2016-07-28 DIAGNOSIS — M79672 Pain in left foot: Secondary | ICD-10-CM

## 2016-07-28 NOTE — ED Triage Notes (Addendum)
11/26 walking in the dark and hit a chair with the three middle toes.  Patient reports these toes have not improved.  Toes swollen and painful.  Able to move toes, no numbness or tingling, left foot with 2+ pulse

## 2016-07-28 NOTE — Discharge Instructions (Signed)
There is no overt fracture noted on the x-ray over where your pain is. We buddy taped your toes. Continue to rest, ice, and elevated when you are able to. You can use Ibuprofen and/or Tylenol as needed for your pain. Follow up as needed.

## 2016-07-28 NOTE — ED Provider Notes (Signed)
CSN: [id number]     Arrival date & time 07/28/16  1740 History   First MD Initiated Contact with Patient 07/28/16 1010     No chief complaint on file.  HPI   Ms. Tersigni is a 70 year old female who presents with left foot pain. She reports that she accidentally ran into and hit her foot against a metal chair on 11/26. She reports her pain is mainly in near her left 3rd and 4th toes. She been resting, icing, and elevating her foot as well as soaking her foot in epsom soaks. Swelling hasn't worsened but just not resolving. Pain is only somewhat improved. Still painful with ambulation. Taking Alleve with no significant improvement in pain. Concerned that she may have broken something.    Past Medical History:  Diagnosis Date  . Anemia   . Anxiety    hx  . Arthritis    "my whole body"  . Atrial septal aneurysm   . COLONIC POLYPS, HX OF   . COPD (chronic obstructive pulmonary disease) ([place])    no per pt on 03/21/2014  . Coronary artery disease    a. s/p CABG 2012.  . Diabetes mellitus ([place])   . DISC DISEASE, CERVICAL   . Gastroesophageal reflux disease   . Heart murmur    as a child  . History of rheumatic fever   . Hyperlipidemia   . Hypertension   . Hypothyroidism   . Insomnia   . Osteoarthritis   . OSTEOPENIA   . PAD (peripheral artery disease) ([place])    a. PTA to R SFA 2015. b. PTA to L SFA 05/2016.  Marland Kitchen RSD (reflex sympathetic dystrophy) 10/31/2011  . Vitamin D deficiency    Past Surgical History:  Procedure Laterality Date  . ABDOMINAL HYSTERECTOMY  1977   "partial"  . BALLOON ANGIOPLASTY, ARTERY Right 03/21/2014   SFA  . CARDIAC CATHETERIZATION  2011   . CARPAL TUNNEL RELEASE Right 2005  . CATARACT EXTRACTION Left 2013  . CORONARY ARTERY BYPASS GRAFT  10/2009   LIMA to the LAD, saphenous vein graft to the acute marginal, saphenous vein graft to the diagonal and obtuse marginal.  . DILATION AND CURETTAGE OF UTERUS  1978  . LOWER EXTREMITY ANGIOGRAM N/A 03/21/2014   Procedure: LOWER EXTREMITY ANGIOGRAM;  Surgeon: Lorretta Harp, MD;  Location: [facility] CATH LAB;  Service: Cardiovascular;  Laterality: N/A;  . LOWER EXTREMITY ANGIOGRAM  06/07/2016   Abdominal aortogram/bilateral iliac angiogram/bifemoral runoff  . PERIPHERAL VASCULAR CATHETERIZATION Bilateral 06/07/2016   Procedure: Lower Extremity Angiography;  Surgeon: Lorretta Harp, MD;  Location: [facility] CV LAB;  Service: Cardiovascular;  Laterality: Bilateral;  . PERIPHERAL VASCULAR CATHETERIZATION Left 06/07/2016   Procedure: Peripheral Vascular Atherectomy;  Surgeon: Lorretta Harp, MD;  Location: [facility] CV LAB;  Service: Cardiovascular;  Laterality: Left;  SFA  . PERIPHERAL VASCULAR CATHETERIZATION Left 06/07/2016   Procedure: Peripheral Vascular Balloon Angioplasty;  Surgeon: Lorretta Harp, MD;  Location: [facility] CV LAB;  Service: Cardiovascular;  Laterality: Left;  SFA  . THYROIDECTOMY  1995  . TONSILLECTOMY    . TOTAL KNEE ARTHROPLASTY Right 06/18/2014   Procedure: RIGHT TOTAL KNEE ARTHROPLASTY;  Surgeon: Tobi Bastos, MD;  Location: [facility] ORS;  Service: Orthopedics;  Laterality: Right;   Family History  Problem Relation Age of Onset  . Arthritis Maternal Aunt   . Heart disease Maternal Aunt   . Hypertension Maternal Aunt   . Diabetes Maternal Aunt   .  Colon cancer Neg Hx   . Kidney disease Neg Hx   . Liver disease Neg Hx   . Throat cancer Neg Hx   . Stomach cancer Neg Hx    Social History  Substance Use Topics  . Smoking status: Former Smoker    Packs/day: 0.80    Years: 45.00    Types: Cigarettes    Quit date: 08/23/2009  . Smokeless tobacco: Never Used  . Alcohol use No     Comment: WINE OCC.   OB History    No data available     Review of Systems: as noted above.   Allergies  Lipitor [atorvastatin]  Home Medications   Prior to Admission medications   Medication Sig Start Date End Date Taking? Authorizing Provider  amLODipine (NORVASC) 10 MG tablet  TAKE ONE TABLET BY MOUTH ONCE DAILY 05/20/16   Biagio Borg, MD  aspirin EC 81 MG tablet Take 81 mg by mouth daily.    Historical Provider, MD  Calcium Carb-Cholecalciferol (CALCIUM 1000 + D PO) Take 1 tablet by mouth daily at 2 PM daily at 2 PM.     Historical Provider, MD  clopidogrel (PLAVIX) 75 MG tablet Take 1 tablet (75 mg total) by mouth daily. 06/08/16   Dayna N Dunn, PA-C  co-enzyme Q-10 30 MG capsule Take 30 mg by mouth daily.     Historical Provider, MD  hydrochlorothiazide (HYDRODIURIL) 25 MG tablet Take 25 mg by mouth daily after lunch. Reported on 09/24/2015 10/28/11   Biagio Borg, MD  levothyroxine (SYNTHROID, LEVOTHROID) 125 MCG tablet Take 1 tablet (125 mcg total) by mouth daily. 05/26/16   Biagio Borg, MD  Multiple Vitamin (MULTIVITAMIN WITH MINERALS) TABS tablet Take 1 tablet by mouth every morning. Centrum    Historical Provider, MD  pravastatin (PRAVACHOL) 40 MG tablet Take 1 tablet (40 mg total) by mouth every evening. 06/08/16   Dayna N Dunn, PA-C  PRENATAL VITAMINS PO Take 1 capsule by mouth every morning.     Historical Provider, MD  ranitidine (ZANTAC) 75 MG tablet Take 150 mg by mouth daily as needed for heartburn.    Historical Provider, MD  tiZANidine (ZANAFLEX) 4 MG tablet Take 1 tablet (4 mg total) by mouth at bedtime. 09/24/15   Rubbie Battiest, NP  Vitamin D, Ergocalciferol, (DRISDOL) 50000 units CAPS capsule Take 50,000 Units by mouth every Tuesday.    Historical Provider, MD   Meds Ordered and Administered this Visit  Medications - No data to display  BP 139/71 (BP Location: Left Arm)   Pulse 83   Temp 97.6 F (36.4 C) (Oral)   Resp 18   SpO2 96%  No data found.   Physical Exam  Constitutional: She appears well-developed and well-nourished. No distress.  HENT:  Head: Normocephalic.  Eyes: Conjunctivae are normal.  Neck: Normal range of motion.  Pulmonary/Chest: Effort normal.  Musculoskeletal:  Feet: Left foot has no significant swelling compared to the  right. Tender to palpation of the interphalangeal and metatarsophalangeal joints of the 3rd and 4th toes. Normal ROM ankle. Able to move toes although with some discomfort at the toes. Able to walk on foot but reports of pain. DP pulses normal. Feet are both warm and well perfused. No sign of infection.      Urgent Care Course   Clinical Course   Obtained x-ray which does not show signs of fracture over the area patient reports of pain. Will buddy tape toes. NSAID and/or Tylenol  PRN for pain. Follow up as needed.    Procedures   Labs Review Labs Reviewed - No data to display  Imaging Review No results found.   MDM   1. Foot pain, left without fracture    Obtained x-ray which does not show signs of fracture over the area patient reports of pain. Final read is pending. Will buddy tape toes. NSAID and/or Tylenol PRN for pain. Follow up as needed.    Patient's history, assessment and plan discussed with Dr. Joseph Art prior to discharge.     Smiley Houseman, MD 07/28/16 1053    Smiley Houseman, MD 07/28/16 1056

## 2016-08-06 ENCOUNTER — Emergency Department (HOSPITAL_COMMUNITY)
Admission: EM | Admit: 2016-08-06 | Discharge: 2016-08-06 | Disposition: A | Discharge status: Home / Self Care | Payer: Medicare Other | Attending: Emergency Medicine | Admitting: Emergency Medicine

## 2016-08-06 ENCOUNTER — Emergency Department (HOSPITAL_COMMUNITY): Payer: Medicare Other

## 2016-08-06 ENCOUNTER — Encounter (HOSPITAL_COMMUNITY): Payer: Self-pay | Admitting: Emergency Medicine

## 2016-08-06 DIAGNOSIS — Z96651 Presence of right artificial knee joint: Secondary | ICD-10-CM | POA: Insufficient documentation

## 2016-08-06 DIAGNOSIS — I2510 Atherosclerotic heart disease of native coronary artery without angina pectoris: Secondary | ICD-10-CM | POA: Diagnosis not present

## 2016-08-06 DIAGNOSIS — Z7982 Long term (current) use of aspirin: Secondary | ICD-10-CM | POA: Insufficient documentation

## 2016-08-06 DIAGNOSIS — E119 Type 2 diabetes mellitus without complications: Secondary | ICD-10-CM | POA: Insufficient documentation

## 2016-08-06 DIAGNOSIS — R0789 Other chest pain: Secondary | ICD-10-CM | POA: Diagnosis not present

## 2016-08-06 DIAGNOSIS — I10 Essential (primary) hypertension: Secondary | ICD-10-CM | POA: Insufficient documentation

## 2016-08-06 DIAGNOSIS — Z951 Presence of aortocoronary bypass graft: Secondary | ICD-10-CM | POA: Diagnosis not present

## 2016-08-06 DIAGNOSIS — R079 Chest pain, unspecified: Secondary | ICD-10-CM | POA: Diagnosis present

## 2016-08-06 DIAGNOSIS — Z79899 Other long term (current) drug therapy: Secondary | ICD-10-CM | POA: Diagnosis not present

## 2016-08-06 DIAGNOSIS — E039 Hypothyroidism, unspecified: Secondary | ICD-10-CM | POA: Diagnosis not present

## 2016-08-06 DIAGNOSIS — J449 Chronic obstructive pulmonary disease, unspecified: Secondary | ICD-10-CM | POA: Diagnosis not present

## 2016-08-06 LAB — BASIC METABOLIC PANEL
Anion gap: 9 (ref 5–15)
BUN: 13 mg/dL (ref 6–20)
CO2: 24 mmol/L (ref 22–32)
Calcium: 8.9 mg/dL (ref 8.9–10.3)
Chloride: 108 mmol/L (ref 101–111)
Creatinine, Ser: 0.74 mg/dL (ref 0.44–1.00)
GFR calc Af Amer: >60 mL/min (ref >60)
GFR calc non Af Amer: >60 mL/min (ref >60)
Glucose, Bld: 123 mg/dL — ABNORMAL HIGH (ref 65–99)
Potassium: 3.5 mmol/L (ref 3.5–5.1)
Sodium: 141 mmol/L (ref 135–145)

## 2016-08-06 LAB — I-STAT TROPONIN, ED
Troponin i, poc: 0 ng/mL (ref 0.00–0.08)
Troponin i, poc: 0 ng/mL (ref 0.00–0.08)

## 2016-08-06 LAB — CBC
HCT: 39.7 % (ref 36.0–46.0)
Hemoglobin: 13.2 g/dL (ref 12.0–15.0)
MCH: 28.5 pg (ref 26.0–34.0)
MCHC: 33.2 g/dL (ref 30.0–36.0)
MCV: 85.7 fL (ref 78.0–100.0)
Platelets: 223 10*3/uL (ref 150–400)
RBC: 4.63 MIL/uL (ref 3.87–5.11)
RDW: 13.4 % (ref 11.5–15.5)
WBC: 4.8 10*3/uL (ref 4.0–10.5)

## 2016-08-06 NOTE — ED Provider Notes (Signed)
By signing my name below, I, Gwenlyn Fudge, attest that this documentation has been prepared under the direction and in the presence of [place], DO. Electronically Signed: Gwenlyn Fudge, ED Scribe. 08/06/16. 3:45 AM.  TIME SEEN: 3:17 AM  CHIEF COMPLAINT: Chest Pain  HPI:  [name] is a 70 y.o. female with PMHx of COPD, CAD status post CABG in 2012, DM HTN and HLD, peripheral vascular disease on Plavix who presents to the Emergency Department complaining of gradual onset, sharp, intermittent left sided chest pain onset earlier tonight. Pt was woken from sleep due to pain which lasted for less than 5 seconds before gradually resolving. She is unaware of any exacerbating or relieving factors. Pt denies pressure or tightness feeling in chest. She denies shortness of breath, diaphoresis, dizziness, fever, cough, nausea, vomiting. No history of PE, DVT, exogenous estrogen use, fracture, surgery, trauma, hospitalization, prolonged travel. No lower extremity swelling or pain. No calf tenderness.  States pain is sharp and comes on suddenly and lasts for only several seconds and then completely resolves.  She reports her last stress test and cardiac catheterization were in 2012. States that she never had pain before her previous CABG. States she had an abnormal stress test done routinely and then had a cardiac catheterization that showed "four blockages".  PCP Dr. Forde Dandy Cardiologist Dr. Gwenlyn Found   ROS: See HPI Constitutional: no fever  Eyes: no drainage  ENT: no runny nose   Cardiovascular:  Chest pain  Resp: no SOB  GI: no vomiting GU: no dysuria Integumentary: no rash  Allergy: no hives  Musculoskeletal: no leg swelling  Neurological: no slurred speech ROS otherwise negative  PAST MEDICAL HISTORY/PAST SURGICAL HISTORY:  Past Medical History:  Diagnosis Date  . Anemia   . Anxiety    hx  . Arthritis    "my whole body"  . Atrial septal aneurysm   . COLONIC POLYPS, HX OF   . COPD  (chronic obstructive pulmonary disease) ([place])    no per pt on 03/21/2014  . Coronary artery disease    a. s/p CABG 2012.  . Diabetes mellitus ([place])   . DISC DISEASE, CERVICAL   . Gastroesophageal reflux disease   . Heart murmur    as a child  . History of rheumatic fever   . Hyperlipidemia   . Hypertension   . Hypothyroidism   . Insomnia   . Osteoarthritis   . OSTEOPENIA   . PAD (peripheral artery disease) ([place])    a. PTA to R SFA 2015. b. PTA to L SFA 05/2016.  Marland Kitchen RSD (reflex sympathetic dystrophy) 10/31/2011  . Vitamin D deficiency     MEDICATIONS:  Prior to Admission medications   Medication Sig Start Date End Date Taking? Authorizing Provider  amLODipine (NORVASC) 10 MG tablet TAKE ONE TABLET BY MOUTH ONCE DAILY 05/20/16   Biagio Borg, MD  aspirin EC 81 MG tablet Take 81 mg by mouth daily.    Historical Provider, MD  Calcium Carb-Cholecalciferol (CALCIUM 1000 + D PO) Take 1 tablet by mouth daily at 2 PM daily at 2 PM.     Historical Provider, MD  clopidogrel (PLAVIX) 75 MG tablet Take 1 tablet (75 mg total) by mouth daily. 06/08/16   Dayna N Dunn, PA-C  co-enzyme Q-10 30 MG capsule Take 30 mg by mouth daily.     Historical Provider, MD  hydrochlorothiazide (HYDRODIURIL) 25 MG tablet Take 25 mg by mouth daily after lunch. Reported on 09/24/2015 10/28/11  Biagio Borg, MD  levothyroxine (SYNTHROID, LEVOTHROID) 125 MCG tablet Take 1 tablet (125 mcg total) by mouth daily. 05/26/16   Biagio Borg, MD  Multiple Vitamin (MULTIVITAMIN WITH MINERALS) TABS tablet Take 1 tablet by mouth every morning. Centrum    Historical Provider, MD  pravastatin (PRAVACHOL) 40 MG tablet Take 1 tablet (40 mg total) by mouth every evening. 06/08/16   Dayna N Dunn, PA-C  PRENATAL VITAMINS PO Take 1 capsule by mouth every morning.     Historical Provider, MD  ranitidine (ZANTAC) 75 MG tablet Take 150 mg by mouth daily as needed for heartburn.    Historical Provider, MD  tiZANidine (ZANAFLEX) 4 MG tablet Take 1  tablet (4 mg total) by mouth at bedtime. 09/24/15   Rubbie Battiest, NP  Vitamin D, Ergocalciferol, (DRISDOL) 50000 units CAPS capsule Take 50,000 Units by mouth every Tuesday.    Historical Provider, MD    ALLERGIES:  Allergies  Allergen Reactions  . Lipitor [Atorvastatin] Other (See Comments)    Muscle aches    SOCIAL HISTORY:  Social History  Substance Use Topics  . Smoking status: Former Smoker    Packs/day: 0.80    Years: 45.00    Types: Cigarettes    Quit date: 08/23/2009  . Smokeless tobacco: Never Used  . Alcohol use No     Comment: WINE OCC.    FAMILY HISTORY: Family History  Problem Relation Age of Onset  . Arthritis Maternal Aunt   . Heart disease Maternal Aunt   . Hypertension Maternal Aunt   . Diabetes Maternal Aunt   . Colon cancer Neg Hx   . Kidney disease Neg Hx   . Liver disease Neg Hx   . Throat cancer Neg Hx   . Stomach cancer Neg Hx     EXAM: BP 135/76 (BP Location: Left Arm)   Pulse 66   Temp 97.5 F (36.4 C) (Oral)   Resp 17   Ht '5\' 3"'$  (1.6 m)   Wt 132 lb (59.9 kg)   SpO2 96%   BMI 23.38 kg/m  CONSTITUTIONAL: Alert and oriented and responds appropriately to questions. Well-appearing; well-nourished, elderly  HEAD: Normocephalic EYES: Conjunctivae clear, PERRL, EOMI ENT: normal nose; no rhinorrhea; moist mucous membranes NECK: Supple, no meningismus, no nuchal rigidity, no LAD  CARD: RRR; S1 and S2 appreciated; no murmurs, no clicks, no rubs, no gallops CHEST: tender to palpation over the left rib under the left breast which reproduces pain with no crepitus, ecchymosis, deformity, rash or other lesions. RESP: Normal chest excursion without splinting or tachypnea; breath sounds clear and equal bilaterally; no wheezes, no rhonchi, no rales, no hypoxia or respiratory distress, speaking full sentences ABD/GI: Normal bowel sounds; non-distended; soft, non-tender, no rebound, no guarding, no peritoneal signs, no hepatosplenomegaly BACK:  The back  appears normal and is non-tender to palpation, there is no CVA tenderness EXT: Normal ROM in all joints; non-tender to palpation; no edema; normal capillary refill; no cyanosis, no calf tenderness or swelling    SKIN: Normal color for age and race; warm; no rash NEURO: Moves all extremities equally, sensation to light touch intact diffusely, cranial nerves II through XII intact, normal speech PSYCH: The patient's mood and manner are appropriate. Grooming and personal hygiene are appropriate.  MEDICAL DECISION MAKING: Patient here with very atypical chest pain. It seems to be musculoskeletal in nature. Doubt ACS, dissection or PE. Pain last for only several seconds and then completely resolves. Not exertional or pleuritic.  Not associated with shortness of breath, nausea or vomiting, diaphoresis or dizziness. EKG shows no new ischemic changes compared to prior. First troponin negative. Plan is to monitor patient, obtain second troponin. She declines any pain medication at this time she is not having any pain. Other labs, chest x-ray pending.  ED PROGRESS: 4:40 AM  Pt's chest x-ray is clear. Still pain-free. Has not had any episodes of pain while in the emergency department. She agrees to stay for a second troponin at 6:30 AM. Is still chest pain-free, hemodynamically stable and second troponin negative, I feel she can be discharged with close outpatient PCP and cardiology follow-up. She is comfortable with this plan.  6:00 AM  Pt ambulated to the bathroom without assistance with quick and steady gait with no chest pain or shortness of breath. Still hemodynamically stable and awaiting second troponin.   6:45 AM  Pt's second troponin negative. She is still completely asymptomatic and hemodynamically stable. We'll discharge home with outpatient cardiology and PCP follow-up. Discussed at length return precautions. She verbalized understanding and is comfortable with this plan.   At this time, I do not feel  there is any life-threatening condition present. I have reviewed and discussed all results (EKG, imaging, lab, urine as appropriate) and exam findings with patient/family. I have reviewed nursing notes and appropriate previous records.  I feel the patient is safe to be discharged home without further emergent workup and can continue workup as an outpatient as needed. Discussed usual and customary return precautions. Patient/family verbalize understanding and are comfortable with this plan.  Outpatient follow-up has been provided. All questions have been answered.    EKG Interpretation  Date/Time:  Friday August 06 2016 03:10:00 EST Ventricular Rate:  64 PR Interval:    QRS Duration: 118 QT Interval:  426 QTC Calculation: 440 R Axis:   85 Text Interpretation:  Sinus rhythm Borderline prolonged PR interval Nonspecific intraventricular conduction delay Anterior infarct, old Borderline ST depression, lateral leads No significant change since last tracing Confirmed by Roland Prine,  DO, Anothony Bursch ([id number]) on 08/06/2016 3:18:44 AM        I personally performed the services described in this documentation, which was scribed in my presence. The recorded information has been reviewed and is accurate.    [place], DO 08/06/16 [phone]

## 2016-08-06 NOTE — ED Triage Notes (Signed)
Per pt she was woken by sharp left sided chest pain that radiates from under her left breast to the left side of her body.  She denied any N/V/SOB/dizziness but reports that she did begin to burp "a little".  She reports that she took '81mg'$  ASA and came to the hospital.

## 2016-08-06 NOTE — ED Notes (Signed)
ED Provider at bedside. 

## 2016-08-06 NOTE — Discharge Instructions (Signed)
You have had 2 negative sets of cardiac enzymes. Your EKG shows no new changes. Your chest x-ray is clear. If you have a return of pain that is persistent, chest pressure or tightness, shortness of breath, sudden sweating, nausea or vomiting with this dizziness, please return to the hospital.

## 2016-08-19 ENCOUNTER — Other Ambulatory Visit: Payer: Self-pay | Admitting: Internal Medicine

## 2016-09-01 ENCOUNTER — Telehealth: Payer: Self-pay | Admitting: Cardiovascular Disease

## 2016-09-01 NOTE — Telephone Encounter (Signed)
Attempt to return call-no answer, lmtcb. 

## 2016-09-01 NOTE — Telephone Encounter (Signed)
Pt says she need a note to back to work.

## 2016-10-13 ENCOUNTER — Other Ambulatory Visit: Payer: Self-pay | Admitting: Thoracic Surgery (Cardiothoracic Vascular Surgery)

## 2016-10-13 DIAGNOSIS — Z87891 Personal history of nicotine dependence: Secondary | ICD-10-CM

## 2016-11-09 ENCOUNTER — Ambulatory Visit (INDEPENDENT_AMBULATORY_CARE_PROVIDER_SITE_OTHER): Payer: PPO | Admitting: Thoracic Surgery (Cardiothoracic Vascular Surgery)

## 2016-11-09 ENCOUNTER — Other Ambulatory Visit: Payer: Self-pay | Admitting: Thoracic Surgery (Cardiothoracic Vascular Surgery)

## 2016-11-09 ENCOUNTER — Encounter: Payer: Self-pay | Admitting: Thoracic Surgery (Cardiothoracic Vascular Surgery)

## 2016-11-09 ENCOUNTER — Ambulatory Visit
Admission: RE | Admit: 2016-11-09 | Discharge: 2016-11-09 | Disposition: A | Discharge status: Home / Self Care | Payer: PPO | Source: Ambulatory Visit | Attending: Thoracic Surgery (Cardiothoracic Vascular Surgery) | Admitting: Thoracic Surgery (Cardiothoracic Vascular Surgery)

## 2016-11-09 VITALS — BP 136/84 | Resp 16 | Ht 63.0 in | Wt 128.0 lb

## 2016-11-09 DIAGNOSIS — R918 Other nonspecific abnormal finding of lung field: Secondary | ICD-10-CM

## 2016-11-09 NOTE — Progress Notes (Signed)
    [address]       [place] [address]             [phone]       HPI: Mrs. [name] returns for her one-year follow-up  She is a 71 -year-old woman with a 45-pack-year history of tobacco abuse. She quit smoking in 2011. In 2015 she was being evaluated for left arm pain and numbness. She had a CT of her cervical spine which showed a possible right apical mass. A CT of the chest showed bilateral apical parenchymal densities. PET/CT showed no hypermetabolism. We have followed that ever since. She also had multiple other smaller 3-4 mm lung nodules.. We followed those for 2 years and all of the changes remain stable. Because she met criteria for low-dose CT screening I recommended she continued to have that on annual basis.  She has not had any new major medical issues over the past year. She's quite some swelling in the submandibular gland. She says it happens about twice a year. It actually is going down some at this point. She has not had any problems with her breathing. Her appetite is good and her weight remains stable.  Past Medical History:  Diagnosis Date  . Anemia   . Anxiety    hx  . Arthritis    "my whole body"  . Atrial septal aneurysm   . COLONIC POLYPS, HX OF   . COPD (chronic obstructive pulmonary disease) ([facility])    no per pt on 03/21/2014  . Coronary artery disease    a. s/p CABG 2012.  . Diabetes mellitus ([facility])   . DISC DISEASE, CERVICAL   . Gastroesophageal reflux disease   . Heart murmur    as a child  . History of rheumatic fever   . Hyperlipidemia   . Hypertension   . Hypothyroidism   . Insomnia   . Osteoarthritis   . OSTEOPENIA   . PAD (peripheral artery disease) ([facility])    a. PTA to R SFA 2015. b. PTA to L SFA 05/2016.  . RSD (reflex sympathetic dystrophy) 10/31/2011  . Vitamin D deficiency      Current Outpatient Prescriptions  Medication Sig Dispense Refill  . amLODipine (NORVASC) 10 MG tablet TAKE ONE TABLET BY MOUTH ONCE  DAILY 90 tablet 1  . aspirin EC 81 MG tablet Take 81 mg by mouth daily.    . Calcium Carb-Cholecalciferol (CALCIUM 1000 + D PO) Take 1 tablet by mouth daily at 2 PM daily at 2 PM.     . co-enzyme Q-10 30 MG capsule Take 30 mg by mouth daily.     . hydrochlorothiazide (HYDRODIURIL) 25 MG tablet Take 25 mg by mouth daily after lunch. Reported on 09/24/2015    . levothyroxine (SYNTHROID, LEVOTHROID) 125 MCG tablet Take 1 tablet (125 mcg total) by mouth daily. 90 tablet 3  . Multiple Vitamin (MULTIVITAMIN WITH MINERALS) TABS tablet Take 1 tablet by mouth every morning. Centrum    . tiZANidine (ZANAFLEX) 4 MG tablet Take 1 tablet (4 mg total) by mouth at bedtime. (Patient taking differently: Take 4 mg by mouth at bedtime as needed. ) 30 tablet 0  . Vitamin D, Ergocalciferol, (DRISDOL) 50000 units CAPS capsule Take 50,000 Units by mouth every Tuesday.    . clopidogrel (PLAVIX) 75 MG tablet Take 1 tablet (75 mg total) by mouth daily. (Patient not taking: Reported on 11/09/2016) 30 tablet 6  . pravastatin (PRAVACHOL) 40 MG tablet Take   1 tablet (40 mg total) by mouth every evening. (Patient not taking: Reported on 11/09/2016) 30 tablet 6   No current facility-administered medications for this visit.    Facility-Administered Medications Ordered in Other Visits  Medication Dose Route Frequency Provider Last Rate Last Dose  . tranexamic acid (CYKLOKAPRON) 2,000 mg in sodium chloride 0.9 % 50 mL Topical Application  2,000 mg Topical Once Amber Constable, PA-C        Physical Exam BP 136/84 (BP Location: Right Arm, Patient Position: Sitting, Cuff Size: Large)   Resp 16   Ht 5' 3" (1.6 m)   Wt 128 lb (58.1 kg)   SpO2 98% Comment: RA  BMI 22.67 kg/m  71-year-old woman in no acute distress Alert and oriented 3 with no focal neurologic deficits Cardiac regular rate and rhythm normal S1 and S2 No cervical or supraclavicular adenopathy Lungs clear with equal breath sounds bilaterally  Diagnostic Tests: CT  CHEST WITHOUT CONTRAST  TECHNIQUE: Multidetector CT imaging of the chest was performed following the standard protocol without IV contrast.  COMPARISON:  Chest CT 10/29/2015.  FINDINGS: Cardiovascular: Heart size is normal. There is no significant pericardial fluid, thickening or pericardial calcification. There is aortic atherosclerosis, as well as atherosclerosis of the great vessels of the mediastinum and the coronary arteries, including calcified atherosclerotic plaque in the left main, left anterior descending, left circumflex and right coronary arteries. Status post median sternotomy for CABG including LIMA to the LAD.  Mediastinum/Nodes: No pathologically enlarged mediastinal or hilar lymph nodes. Please note that accurate exclusion of hilar adenopathy is limited on noncontrast CT scans. Esophagus is unremarkable in appearance. No axillary lymphadenopathy.  Lungs/Pleura: Several scattered tiny pulmonary nodules measuring 4 mm or less are again noted throughout the lungs bilaterally, stable in size, number and distribution dating back to prior studies from 11/05/2009, considered definitively benign requiring no future imaging followup. No other new suspicious appearing pulmonary nodules or masses are noted. Mild diffuse bronchial wall thickening with mild centrilobular and paraseptal emphysema. Mild cylindrical bronchiectasis most evident in the lower lobes of the lungs bilaterally. Bilateral apical nodular areas of pleuroparenchymal architectural distortion are unchanged, compatible with areas of chronic post infectious or inflammatory scarring. No acute consolidative airspace disease. No pleural effusions.  Upper Abdomen: Aortic atherosclerosis. Nonobstructive calculi in the collecting system of the left kidney measuring up to 3 mm in diameter.  Musculoskeletal: There are no aggressive appearing lytic or blastic lesions noted in the visualized portions of the  skeleton. Median sternotomy wires.  IMPRESSION: 1. Multiple tiny pulmonary nodules measuring 4 mm or less in size are stable dating back to 2011 and are considered definitively benign requiring no future imaging followup. 2. Aortic atherosclerosis, in addition to left main and 3 vessel coronary artery disease. Please note that although the presence of coronary artery calcium documents the presence of coronary artery disease, the severity of this disease and any potential stenosis cannot be assessed on this non-gated CT examination. Assessment for potential risk factor modification, dietary therapy or pharmacologic therapy may be warranted, if clinically indicated. Status post median sternotomy for CABG, including LIMA to the LAD. 3. Mild diffuse bronchial wall thickening with mild centrilobular and paraseptal emphysema; imaging findings suggestive of underlying COPD. 4. Mild cylindrical bronchiectasis most evident in the lower lobes of the lungs bilaterally.   Electronically Signed   By: Daniel  Entrikin M.D.   On: 11/09/2016 11:06  I personally reviewed the CT chest and concur with the findings noted above    Impression: 71 year old woman with a remote history of tobacco abuse who has multiple small pulmonary nodules. These have been stable over time. She does meet criteria for low-dose CT screening and she continued to have annual low dose CT scans.  Tobacco abuse- quit smoking in 2011.  Coronary artery disease- status post CABG in 2011. No angina at present  Plan: Return in 1 year with low-dose CT to screen for lung cancer  Melrose Nakayama, MD [facility] [phone]

## 2016-11-17 DIAGNOSIS — Z1389 Encounter for screening for other disorder: Secondary | ICD-10-CM | POA: Diagnosis not present

## 2016-11-17 DIAGNOSIS — K5900 Constipation, unspecified: Secondary | ICD-10-CM | POA: Diagnosis not present

## 2016-11-17 DIAGNOSIS — E784 Other hyperlipidemia: Secondary | ICD-10-CM | POA: Diagnosis not present

## 2016-11-17 DIAGNOSIS — R0989 Other specified symptoms and signs involving the circulatory and respiratory systems: Secondary | ICD-10-CM | POA: Diagnosis not present

## 2016-11-17 DIAGNOSIS — E038 Other specified hypothyroidism: Secondary | ICD-10-CM | POA: Diagnosis not present

## 2016-11-17 DIAGNOSIS — E559 Vitamin D deficiency, unspecified: Secondary | ICD-10-CM | POA: Diagnosis not present

## 2016-11-17 DIAGNOSIS — J432 Centrilobular emphysema: Secondary | ICD-10-CM | POA: Diagnosis not present

## 2016-11-17 DIAGNOSIS — R918 Other nonspecific abnormal finding of lung field: Secondary | ICD-10-CM | POA: Diagnosis not present

## 2016-11-17 DIAGNOSIS — M859 Disorder of bone density and structure, unspecified: Secondary | ICD-10-CM | POA: Diagnosis not present

## 2016-11-17 DIAGNOSIS — E1151 Type 2 diabetes mellitus with diabetic peripheral angiopathy without gangrene: Secondary | ICD-10-CM | POA: Diagnosis not present

## 2016-11-17 DIAGNOSIS — D500 Iron deficiency anemia secondary to blood loss (chronic): Secondary | ICD-10-CM | POA: Diagnosis not present

## 2016-11-17 DIAGNOSIS — I2510 Atherosclerotic heart disease of native coronary artery without angina pectoris: Secondary | ICD-10-CM | POA: Diagnosis not present

## 2016-11-24 ENCOUNTER — Encounter: Payer: Self-pay | Admitting: Podiatry

## 2016-11-24 ENCOUNTER — Ambulatory Visit (INDEPENDENT_AMBULATORY_CARE_PROVIDER_SITE_OTHER): Payer: PPO | Admitting: Podiatry

## 2016-11-24 DIAGNOSIS — B351 Tinea unguium: Secondary | ICD-10-CM

## 2016-11-24 DIAGNOSIS — E119 Type 2 diabetes mellitus without complications: Secondary | ICD-10-CM

## 2016-11-24 DIAGNOSIS — M79672 Pain in left foot: Secondary | ICD-10-CM

## 2016-11-24 DIAGNOSIS — M79671 Pain in right foot: Secondary | ICD-10-CM

## 2016-11-24 NOTE — Progress Notes (Signed)
Complaint:  Visit Type: Patient returns to my office for continued preventative foot care services. Complaint: Patient states" my nails have grown long and thick and become painful to walk and wear shoes" Patient has been diagnosed with DM with no foot complications. The patient presents for preventative foot care services. No changes to ROS  Podiatric Exam: Vascular: dorsalis pedis and posterior tibial pulses are palpable bilateral. Capillary return is immediate. Temperature gradient is WNL. Skin turgor WNL  Sensorium: Normal Semmes Weinstein monofilament test. Normal tactile sensation bilaterally. Nail Exam: Pt has thick disfigured discolored nails with subungual debris noted bilateral entire nail hallux through fifth toenails Ulcer Exam: There is no evidence of ulcer or pre-ulcerative changes or infection. Orthopedic Exam: Muscle tone and strength are WNL. No limitations in general ROM. No crepitus or effusions noted. HAV  B/L. Skin: No Porokeratosis. No infection or ulcers  Diagnosis:  Onychomycosis, , Pain in right toe, pain in left toes  Treatment & Plan Procedures and Treatment: Consent by patient was obtained for treatment procedures. The patient understood the discussion of treatment and procedures well. All questions were answered thoroughly reviewed. Debridement of mycotic and hypertrophic toenails, 1 through 5 bilateral and clearing of subungual debris. No ulceration, no infection noted.  Return Visit-Office Procedure: Patient instructed to return to the office for a follow up visit 3 months for continued evaluation and treatment.    Shanasia Ibrahim DPM 

## 2017-01-10 ENCOUNTER — Other Ambulatory Visit: Payer: Self-pay | Admitting: Cardiovascular Disease

## 2017-01-10 DIAGNOSIS — I739 Peripheral vascular disease, unspecified: Secondary | ICD-10-CM

## 2017-01-12 ENCOUNTER — Ambulatory Visit (HOSPITAL_COMMUNITY)
Admission: RE | Admit: 2017-01-12 | Discharge: 2017-01-12 | Disposition: A | Discharge status: Home / Self Care | Payer: PPO | Source: Ambulatory Visit | Attending: Cardiology | Admitting: Cardiology

## 2017-01-12 DIAGNOSIS — I70202 Unspecified atherosclerosis of native arteries of extremities, left leg: Secondary | ICD-10-CM | POA: Diagnosis not present

## 2017-01-12 DIAGNOSIS — I10 Essential (primary) hypertension: Secondary | ICD-10-CM | POA: Diagnosis not present

## 2017-01-12 DIAGNOSIS — I739 Peripheral vascular disease, unspecified: Secondary | ICD-10-CM | POA: Diagnosis not present

## 2017-01-12 DIAGNOSIS — Z87891 Personal history of nicotine dependence: Secondary | ICD-10-CM | POA: Diagnosis not present

## 2017-01-12 DIAGNOSIS — I2510 Atherosclerotic heart disease of native coronary artery without angina pectoris: Secondary | ICD-10-CM | POA: Insufficient documentation

## 2017-01-12 DIAGNOSIS — Z951 Presence of aortocoronary bypass graft: Secondary | ICD-10-CM | POA: Insufficient documentation

## 2017-01-12 DIAGNOSIS — E785 Hyperlipidemia, unspecified: Secondary | ICD-10-CM | POA: Insufficient documentation

## 2017-01-18 ENCOUNTER — Telehealth: Payer: Self-pay | Admitting: *Deleted

## 2017-01-18 ENCOUNTER — Telehealth: Payer: Self-pay | Admitting: Cardiovascular Disease

## 2017-01-18 NOTE — Telephone Encounter (Signed)
Patient made aware of results and verbalized her understanding.

## 2017-01-18 NOTE — Telephone Encounter (Signed)
Left message to call back:  Notes recorded by Lorretta Harp, MD on 01/13/2017 at 4:15 PM EDT Status post atherectomy left mid and distal SFA. Repeat 12 months

## 2017-01-18 NOTE — Telephone Encounter (Signed)
[name] is returning a call about test results . Please call and lmsg

## 2017-01-24 ENCOUNTER — Ambulatory Visit (INDEPENDENT_AMBULATORY_CARE_PROVIDER_SITE_OTHER): Payer: PPO | Admitting: Physician Assistant

## 2017-01-24 ENCOUNTER — Encounter: Payer: Self-pay | Admitting: Physician Assistant

## 2017-01-24 ENCOUNTER — Other Ambulatory Visit: Payer: Self-pay | Admitting: Cardiovascular Disease

## 2017-01-24 VITALS — BP 116/68 | HR 71 | Ht 63.0 in | Wt 132.0 lb

## 2017-01-24 DIAGNOSIS — R112 Nausea with vomiting, unspecified: Secondary | ICD-10-CM

## 2017-01-24 DIAGNOSIS — R1031 Right lower quadrant pain: Secondary | ICD-10-CM | POA: Diagnosis not present

## 2017-01-24 DIAGNOSIS — K219 Gastro-esophageal reflux disease without esophagitis: Secondary | ICD-10-CM

## 2017-01-24 DIAGNOSIS — K5901 Slow transit constipation: Secondary | ICD-10-CM | POA: Diagnosis not present

## 2017-01-24 DIAGNOSIS — I739 Peripheral vascular disease, unspecified: Secondary | ICD-10-CM

## 2017-01-24 MED ORDER — CIPROFLOXACIN HCL 500 MG PO TABS: 500.0000 mg | ORAL_TABLET | Freq: Two times a day (BID) | ORAL | 0 refills | Status: DC

## 2017-01-24 NOTE — Patient Instructions (Signed)
We have sent the following medications to your pharmacy for you to pick up at your convenience: [facility]. 1. Ciprofloxin 500 mg  Start Ranitidine ( Zantac ) 150 mg, Take 1 tab twice daily regularly before breakfast and dinner.   We have given you samples of Amitiza 24 mcg.  Take 1 tablet twice daily.  Let us know if this helps. It it does we will call back.   Call us back in 7-10 days with progress report.

## 2017-01-24 NOTE — Progress Notes (Signed)
Subjective:    Patient ID: [name], female    DOB: 09-13-1945, 71 y.o.   MRN: [id number]  HPI [name] is a pleasant 71 year old African-American female known to Dr. Ardis Hughs who was last in our office in 2016. She has history of GERD, diverticulosis, hypertension, coronary artery disease status post previous CABG, peripheral vascular disease, COPD, hypothyroidism and adult-onset diabetes mellitus. Last colonoscopy 2009 revealed left-sided diverticulosis no polyps and was recommended to follow-up in 10 years EGD in 2014 mouth gastritis H. pylori negative. Upper abdominal ultrasound 2015 negative and CT of the abdomen and pelvis in 2014 showed right and left-sided diverticulosis no diverticulitis. Line Patient comes in today stating that she's been having some nausea intermittently and ongoing difficulty with her bowels with long-term constipation and a lot of gas. She has been using milk of magnesia on an as-needed basis and says sometimes she'll go as long as a week without a bowel movement. She has had bilateral lower quadrant discomfort intermittently for several months, but says that her right side is been bothering her more over the past few weeks. She also describes ongoing heartburn without dysphagia.  Review of Systems Pertinent positive and negative review of systems were noted in the above HPI section.  All other review of systems was otherwise negative.  Outpatient Encounter Prescriptions as of 01/24/2017  Medication Sig  . amLODipine (NORVASC) 10 MG tablet TAKE ONE TABLET BY MOUTH ONCE DAILY  . aspirin EC 81 MG tablet Take 81 mg by mouth daily.  . Calcium Carb-Cholecalciferol (CALCIUM 1000 + D PO) Take 1 tablet by mouth daily at 2 PM daily at 2 PM.   . co-enzyme Q-10 30 MG capsule Take 30 mg by mouth daily.   . hydrochlorothiazide (HYDRODIURIL) 25 MG tablet Take 25 mg by mouth daily after lunch. Reported on 09/24/2015  . levothyroxine (SYNTHROID, LEVOTHROID) 125 MCG tablet Take 1  tablet (125 mcg total) by mouth daily.  . Multiple Vitamin (MULTIVITAMIN WITH MINERALS) TABS tablet Take 1 tablet by mouth every morning. Centrum  . tiZANidine (ZANAFLEX) 4 MG tablet Take 1 tablet (4 mg total) by mouth at bedtime. (Patient taking differently: Take 4 mg by mouth at bedtime as needed. )  . Vitamin D, Ergocalciferol, (DRISDOL) 50000 units CAPS capsule Take 50,000 Units by mouth every Tuesday.  . ciprofloxacin (CIPRO) 500 MG tablet Take 1 tablet (500 mg total) by mouth 2 (two) times daily.  . [DISCONTINUED] clopidogrel (PLAVIX) 75 MG tablet Take 1 tablet (75 mg total) by mouth daily. (Patient not taking: Reported on 11/09/2016)  . [DISCONTINUED] pravastatin (PRAVACHOL) 40 MG tablet Take 1 tablet (40 mg total) by mouth every evening. (Patient not taking: Reported on 11/09/2016)   Facility-Administered Encounter Medications as of 01/24/2017  Medication  . tranexamic acid (CYKLOKAPRON) 2,000 mg in sodium chloride 0.9 % 50 mL Topical Application   Allergies  Allergen Reactions  . Lipitor [Atorvastatin] Other (See Comments)    Muscle aches   Patient Active Problem List   Diagnosis Date Noted  . CAD in native artery 06/08/2016  . Skin inflammation 04/06/2016  . Contracture of hand joint 04/06/2016  . Microhematuria 11/12/2015  . Left shoulder pain 10/15/2015  . Trapezius muscle spasm 09/24/2015  . Type 2 diabetes mellitus ([place]) 08/07/2015  . Urinary frequency 04/04/2015  . External hemorrhoid 11/02/2014  . Left-sided thoracic back pain 10/03/2014  . Gingivitis 10/03/2014  . Routine general medical examination at a health care facility 09/10/2014  . Total knee replacement  status 06/18/2014  . Tooth pain 04/18/2014  . Claudication ([employer]) 03/21/2014  . Primary localized osteoarthrosis, lower leg 08/10/2013  . Right knee pain 08/02/2013  . Upper respiratory infection 08/02/2013  . Dyspepsia 11/16/2012  . Knee pain, right 02/18/2012  . PAD (peripheral artery disease) ([place])  12/03/2011  . PVD (peripheral vascular disease) ([place]) 10/31/2011  . RSD (reflex sympathetic dystrophy) 10/31/2011  . Vitamin D deficiency   . Preop exam for internal medicine 10/28/2011  . Anxiety 03/11/2011  . COPD (chronic obstructive pulmonary disease) ([place]) 02/02/2011  . Anemia 02/02/2011  . History of rheumatic fever 02/02/2011  . Insomnia 02/02/2011  . Coronary atherosclerosis 09/08/2010  . CONSTIPATION 02/19/2010  . TOBACCO ABUSE 07/21/2009  . MURMUR 06/17/2009  . [place] DISEASE, CERVICAL 03/20/2009  . Pain in Soft Tissues of Limb 05/23/2008  . Hypothyroidism 09/07/2007  . Hyperlipidemia LDL goal <70 09/07/2007  . Essential hypertension 09/07/2007  . ALLERGIC RHINITIS 09/07/2007  . GERD 09/07/2007  . Primary osteoarthritis of right knee 09/07/2007  . Osteopenia 09/07/2007  . COLONIC POLYPS, HX OF 09/07/2007   Social History   Social History  . Marital status: Single    Spouse name: N/A  . Number of children: 2  . Years of education: 14   Occupational History  . Retired Retired   Social History Main Topics  . Smoking status: Former Smoker    Packs/day: 0.80    Years: 45.00    Types: Cigarettes    Quit date: 08/23/2009  . Smokeless tobacco: Never Used  . Alcohol use No     Comment: WINE OCC.  . Drug use: No  . Sexual activity: Not Currently   Other Topics Concern  . Not on file   Social History Narrative  . No narrative on file    Ms. Cortez's family history includes Arthritis in her maternal aunt; Diabetes in her maternal aunt; Heart disease in her maternal aunt; Hypertension in her maternal aunt.      Objective:    Vitals:   01/24/17 1106  BP: 116/68  Pulse: 71    Physical Exam well-developed older African-American female in no acute distress, pleasant blood pressure 116/68 pulse 71, BMI 23.3. HEENT; nontraumatic normocephalic EOMI PERRLA sclera anicteric, Cardiovascular; regular rate and rhythm with S1-S2 no murmur or gallop, Sternal incisional  scar, Pulmonary ;clear bilaterally, Abdomen; soft, bowel sounds are present, there is no palpable mass or hepatosplenomegaly she does have some tenderness bilaterally in the lower quadrants right greater than left, Rectal ;exam not done, Extremities ;no clubbing cyanosis or edema skin warm and dry, Neuropsych ;mood and affect appropriate       Assessment & Plan:   #52 71 year old African-American female with intermittent nausea, indigestion and heartburn #2 chronic constipation #3 right lower quadrant pain-previously documented right and left-sided diverticuli, possible mild diverticulitis #4 coronary artery disease status post CABG #5 adult-onset diabetes mellitus #6 peripheral vascular disease #7 COPD #8 colon cancer surveillance-last colonoscopy 2009 no polyps  Plan; start Zantac 150 mg by mouth twice a day. Patient has tried MiraLAX, says it doesn't work and does not like the taste. We'll give her a trial of vomiting is a 24 g by mouth twice a day, samples given today if this is effective will send prescription Start Cipro 500 mg by mouth twice a day 7 days. Patient is asked to call back if her symptoms are not improving in 7-10 days. If symptoms are persisting she may need CT imaging.  Follow-up colonoscopy slated  for 2019.  Pasha Broad Genia Harold PA-C 01/24/2017   Cc: Reynold Bowen, MD

## 2017-01-25 NOTE — Progress Notes (Signed)
I agree with the above note, plan 

## 2017-02-20 ENCOUNTER — Ambulatory Visit (HOSPITAL_COMMUNITY)
Admission: EM | Admit: 2017-02-20 | Discharge: 2017-02-20 | Disposition: A | Discharge status: Home / Self Care | Payer: PPO | Attending: Emergency Medicine | Admitting: Emergency Medicine

## 2017-02-20 ENCOUNTER — Encounter (HOSPITAL_COMMUNITY): Payer: Self-pay | Admitting: *Deleted

## 2017-02-20 DIAGNOSIS — L089 Local infection of the skin and subcutaneous tissue, unspecified: Secondary | ICD-10-CM | POA: Diagnosis not present

## 2017-02-20 DIAGNOSIS — S91311A Laceration without foreign body, right foot, initial encounter: Secondary | ICD-10-CM

## 2017-02-20 MED ORDER — BACITRACIN ZINC 500 UNIT/GM EX OINT: 1.0000 "application " | TOPICAL_OINTMENT | Freq: Two times a day (BID) | CUTANEOUS | 0 refills | Status: DC

## 2017-02-20 MED ORDER — AMOXICILLIN-POT CLAVULANATE 875-125 MG PO TABS: 1.0000 | ORAL_TABLET | Freq: Two times a day (BID) | ORAL | 0 refills | Status: DC

## 2017-02-20 NOTE — ED Triage Notes (Signed)
Reports getting hit in posterior right heel by a swinging storm door 8 days ago, sustaining irregular laceration.  Has been applying H2O2.  Pain continues, now ankle getting swollen.  Yesterday started with right lower leg pain.  Laceration appears macerated.

## 2017-02-20 NOTE — ED Provider Notes (Signed)
CSN: [id number]     Arrival date & time 02/20/17  1200 History   First MD Initiated Contact with Patient 02/20/17 1214     Chief Complaint  Patient presents with  . Foot Injury   (Consider location/radiation/quality/duration/timing/severity/associated sxs/prior Treatment) [name] is a 71 y.o. female with prior medical history of diabetes, hypertension, arthritis, coronary artery disease, amongst other chronic diagnosis who presents to the [facility] urgent care with a chief complaint of a laceration to her right heel. States that 8 days ago she caught the back of her heel on the storm door, she has been applying hydrogen peroxide to the wound, however is been continued to become painful, was swelling, and erythema. She has no loss of function or sensory are otherwise of the foot. No other complaints   The history is provided by the patient.  Foot Injury    Past Medical History:  Diagnosis Date  . Anemia   . Anxiety    hx  . Arthritis    "my whole body"  . Atrial septal aneurysm   . COLONIC POLYPS, HX OF   . COPD (chronic obstructive pulmonary disease) ([place])    no per pt on 03/21/2014  . Coronary artery disease    a. s/p CABG 2012.  . Diabetes mellitus ([place])   . DISC DISEASE, CERVICAL   . Gastroesophageal reflux disease   . Heart murmur    as a child  . History of rheumatic fever   . Hyperlipidemia   . Hypertension   . Hypothyroidism   . Insomnia   . Osteoarthritis   . OSTEOPENIA   . PAD (peripheral artery disease) ([place])    a. PTA to R SFA 2015. b. PTA to L SFA 05/2016.  Marland Kitchen RSD (reflex sympathetic dystrophy) 10/31/2011  . Vitamin D deficiency    Past Surgical History:  Procedure Laterality Date  . ABDOMINAL HYSTERECTOMY  1977   "partial"  . BALLOON ANGIOPLASTY, ARTERY Right 03/21/2014   SFA  . CARDIAC CATHETERIZATION  2011   . CARPAL TUNNEL RELEASE Right 2005  . CATARACT EXTRACTION Left 2013  . CORONARY ARTERY BYPASS GRAFT  10/2009   LIMA to the LAD,  saphenous vein graft to the acute marginal, saphenous vein graft to the diagonal and obtuse marginal.  . DILATION AND CURETTAGE OF UTERUS  1978  . JOINT REPLACEMENT    . LOWER EXTREMITY ANGIOGRAM N/A 03/21/2014   Procedure: LOWER EXTREMITY ANGIOGRAM;  Surgeon: Lorretta Harp, MD;  Location: [facility] CATH LAB;  Service: Cardiovascular;  Laterality: N/A;  . LOWER EXTREMITY ANGIOGRAM  06/07/2016   Abdominal aortogram/bilateral iliac angiogram/bifemoral runoff  . PERIPHERAL VASCULAR CATHETERIZATION Bilateral 06/07/2016   Procedure: Lower Extremity Angiography;  Surgeon: Lorretta Harp, MD;  Location: [place] CV LAB;  Service: Cardiovascular;  Laterality: Bilateral;  . PERIPHERAL VASCULAR CATHETERIZATION Left 06/07/2016   Procedure: Peripheral Vascular Atherectomy;  Surgeon: Lorretta Harp, MD;  Location: [place] CV LAB;  Service: Cardiovascular;  Laterality: Left;  SFA  . PERIPHERAL VASCULAR CATHETERIZATION Left 06/07/2016   Procedure: Peripheral Vascular Balloon Angioplasty;  Surgeon: Lorretta Harp, MD;  Location: [place] CV LAB;  Service: Cardiovascular;  Laterality: Left;  SFA  . THYROIDECTOMY  1995  . TONSILLECTOMY    . TOTAL KNEE ARTHROPLASTY Right 06/18/2014   Procedure: RIGHT TOTAL KNEE ARTHROPLASTY;  Surgeon: Tobi Bastos, MD;  Location: [facility] ORS;  Service: Orthopedics;  Laterality: Right;   Family History  Problem Relation Age of  Onset  . Arthritis Maternal Aunt   . Heart disease Maternal Aunt   . Hypertension Maternal Aunt   . Diabetes Maternal Aunt   . Colon cancer Neg Hx   . Kidney disease Neg Hx   . Liver disease Neg Hx   . Throat cancer Neg Hx   . Stomach cancer Neg Hx    Social History  Substance Use Topics  . Smoking status: Former Smoker    Packs/day: 0.80    Years: 45.00    Types: Cigarettes    Quit date: 08/23/2009  . Smokeless tobacco: Never Used  . Alcohol use No     Comment: WINE OCC.   OB History    No data available     Review of Systems    Constitutional: Negative.   HENT: Negative.   Respiratory: Negative.   Cardiovascular: Negative.   Gastrointestinal: Negative.   Musculoskeletal: Negative.   Skin: Positive for wound.  Neurological: Negative.     Allergies  Lipitor [atorvastatin]  Home Medications   Prior to Admission medications   Medication Sig Start Date End Date Taking? Authorizing Provider  amLODipine (NORVASC) 10 MG tablet TAKE ONE TABLET BY MOUTH ONCE DAILY 08/19/16  Yes Biagio Borg, MD  aspirin EC 81 MG tablet Take 81 mg by mouth daily.   Yes [provider]  Calcium Carb-Cholecalciferol (CALCIUM 1000 + D PO) Take 1 tablet by mouth daily at 2 PM daily at 2 PM.    Yes [provider]  levothyroxine (SYNTHROID, LEVOTHROID) 125 MCG tablet Take 1 tablet (125 mcg total) by mouth daily. 05/26/16  Yes Biagio Borg, MD  Multiple Vitamin (MULTIVITAMIN WITH MINERALS) TABS tablet Take 1 tablet by mouth every morning. Centrum   Yes [provider]  Vitamin D, Ergocalciferol, (DRISDOL) 50000 units CAPS capsule Take 50,000 Units by mouth every Tuesday.   Yes [provider]  amoxicillin-clavulanate (AUGMENTIN) 875-125 MG tablet Take 1 tablet by mouth 2 (two) times daily. 02/20/17   Barnet Glasgow, NP  bacitracin ointment Apply 1 application topically 2 (two) times daily. 02/20/17   Barnet Glasgow, NP  ciprofloxacin (CIPRO) 500 MG tablet Take 1 tablet (500 mg total) by mouth 2 (two) times daily. 01/24/17   Esterwood, Amy S, PA-C  co-enzyme Q-10 30 MG capsule Take 30 mg by mouth daily.     [provider]  hydrochlorothiazide (HYDRODIURIL) 25 MG tablet Take 25 mg by mouth daily after lunch. Reported on 09/24/2015 10/28/11   Biagio Borg, MD  tiZANidine (ZANAFLEX) 4 MG tablet Take 1 tablet (4 mg total) by mouth at bedtime. Patient taking differently: Take 4 mg by mouth at bedtime as needed.  09/24/15   Rubbie Battiest, NP   Meds Ordered and Administered this Visit  Medications - No  data to display  BP 132/79   Pulse 81   Temp 97.8 F (36.6 C) (Oral)   Resp 16   SpO2 95%  No data found.   Physical Exam  Constitutional: She is oriented to person, place, and time. She appears well-developed and well-nourished. No distress.  HENT:  Head: Normocephalic and atraumatic.  Right Ear: External ear normal.  Left Ear: External ear normal.  Eyes: Conjunctivae are normal.  Neck: Normal range of motion.  Neurological: She is alert and oriented to person, place, and time.  Skin: Skin is warm and dry. Capillary refill takes less than 2 seconds. No rash noted. She is not diaphoretic. No erythema.  See attached  photograph of the wound  Psychiatric: She has a normal mood and affect. Her behavior is normal.  Nursing note and vitals reviewed.     Urgent Care Course     Procedures (including critical care time)  Labs Review Labs Reviewed - No data to display  Imaging Review No results found.     MDM   1. Right foot infection   2. Laceration of right foot, initial encounter     Betadine soak was done in clinic along with wound care and bandaging. Wound care guidelines were provided to the patient, and prescription for bacitracin, along with Augmentin for wound infection,  [facility] with [facility] and sees podiatry very regularly, I recommend she schedule an appointment for follow-up care and monitoring within 1-2 weeks, or sooner if she has worsening symptoms.    Barnet Glasgow, NP 02/20/17 1257

## 2017-02-20 NOTE — Discharge Instructions (Signed)
For your wound, we have cleaned it here in the clinic, and bandaged it, change her bandages at least once daily, I prescribed bacitracin ointment, apply topically also at least once or twice a day. And I have started you on Augmentin, take one tablet twice a day as well. Contact your podiatrist, and set up an appointment towards the end of this week, or the beginning of next to schedule an appointment for follow-up care.

## 2017-03-02 ENCOUNTER — Ambulatory Visit (INDEPENDENT_AMBULATORY_CARE_PROVIDER_SITE_OTHER): Payer: PPO | Admitting: Podiatry

## 2017-03-02 DIAGNOSIS — E119 Type 2 diabetes mellitus without complications: Secondary | ICD-10-CM | POA: Diagnosis not present

## 2017-03-02 DIAGNOSIS — S91311A Laceration without foreign body, right foot, initial encounter: Secondary | ICD-10-CM

## 2017-03-02 DIAGNOSIS — M79676 Pain in unspecified toe(s): Secondary | ICD-10-CM

## 2017-03-02 DIAGNOSIS — B351 Tinea unguium: Secondary | ICD-10-CM | POA: Diagnosis not present

## 2017-03-02 NOTE — Progress Notes (Signed)
This patient presents the office with chief complaint of pain in the back of her right heel.  She says she initially injured her foot by catching her foot on a storm door.  She initially treated it with peroxide at home and was seen in the emergency care.  The emergency care, diagnosed an infection and a laceration on the back of the right heel.  She was told to apply and treated with Betadine soaks.  She was also given a prescription for Augmentin to take to prevent infection.  She now presents the office stating that she still has her pain in the back of her right leg and pain that extends up into her leg when she walks.  She says this skin lesion is still open and not healing.  She presents the office today for an evaluation and treatment of this painful area.  She also relates having long thick painful nails on the second and third toes of the right foot and the second toe of the left foot.   She presents for preventative foot care services also.    GENERAL APPEARANCE: Alert, conversant. Appropriately groomed. No acute distress.  VASCULAR: Pedal pulses are  palpable at  [facility] and PT bilateral.  Capillary refill time is immediate to all digits,  Normal temperature gradient.  Digital hair growth is present bilateral  NEUROLOGIC: sensation is normal to 5.07 monofilament at 5/5 sites bilateral.  Light touch is intact bilateral, Muscle strength normal.  MUSCULOSKELETAL: acceptable muscle strength, tone and stability bilateral.  Intrinsic muscluature intact bilateral.  Rectus appearance of foot and digits noted bilateral. HAV  B/L. NAILS  thick disfigured discolored nails on the second and third toes, right foot and the toenail. #2 on her left foot.   DERMATOLOGIC: skin color, texture, and turgor are within normal limits.   no interdigital maceration noted. She has an open  laceration that is irregular at the insertion of the Achilles tendon, right foot. There is no evidence of any infection, redness or drainage.   There is a movable skin flap noted upon dorsiflexion and plantarflexion of her right foot.  Laceration posterior aspect right foot  Onychomycosis  B/L  ROV  debridement of the onychomycotic nails was performed.  Neosporin and a dry sterile dressing was applied to the actual site of the laceration.  Her right foot was then bandaged with an Haematologist and she was told to walk in the surgical shoe.  She is told to return to the office in 10 days for further evaluation and treatment of this irregular laceration, right foot.  If this condition worsens or becomes very painful, the patient was told to contact this office or go to the Emergency Department at the hospital.  Gardiner Barefoot DPM

## 2017-03-03 ENCOUNTER — Telehealth: Payer: Self-pay | Admitting: Podiatry

## 2017-03-03 ENCOUNTER — Other Ambulatory Visit: Payer: Self-pay | Admitting: Internal Medicine

## 2017-03-03 NOTE — Telephone Encounter (Addendum)
Left message informing pt that she could make a knick at either edge of the unna boot to relieve pressure or tightness, and she needed to limit her activity, rest and elevate the foot, because having it below her heart would increase swelling and that would increase discomfort, and to give me a call. Pt returned my call states she did cut the cast and has had some relief. I told pt to call if she needs anything.03/07/2017-I spoke with pt, she states she has cut the boot some more and had some relief but she feels she is still getting some sharp pain in the foot. I told pt to come to the center and I would remove and take a look. Pt states she will come to the center now.

## 2017-03-03 NOTE — Telephone Encounter (Signed)
Yes, I saw Dr. Prudence Davidson yesterday and he put a bandage on my foot then a boot. The bandage is really tight and it has bothered me all night long. What can I do?

## 2017-03-07 ENCOUNTER — Telehealth: Payer: Self-pay | Admitting: *Deleted

## 2017-03-07 NOTE — Telephone Encounter (Addendum)
Pt presented to office to have uncomfortable dressing removed. I removed the unna boot applied 03/02/2017, that pt had stated was too tight and was giving her sharp pains in the front of her foot not behind. Pt states she had cut the unna boot to relieve the pressure and had initially relieved but was again painful and tight. I removed the unna boot layer by layer, the dorsal foot and anterior ankle were red with darkened areas in the center, the posterior heel had dark scabbing but hard skin was dry around the scabbing. I told pt I would cover the heel wound and reddened dorsal areas with neosporin and pad both areas for comfort and wrap with roll gauze, then paper tape gauze roll end to gauze and not tape to skin. I told pt to cleanse foot daily with warm soapy water and rinse pat dry and redress as I had. I gave pt sterile gauze pack, stockingette, and neosporin samples to dress wound as described until seen by Dr. Prudence Davidson 03/11/2017. Pt states understanding and that the foot felt better already.03/10/2017-I left message informing pt I felt the darkened areas on the front of her foot and ankle were bruises and then the line was interrupted by a female voice and the went dead. Left message informing I felt she needed to keep the appt for Friday to have the heel

## 2017-03-09 DIAGNOSIS — Z961 Presence of intraocular lens: Secondary | ICD-10-CM | POA: Diagnosis not present

## 2017-03-09 DIAGNOSIS — E119 Type 2 diabetes mellitus without complications: Secondary | ICD-10-CM | POA: Diagnosis not present

## 2017-03-09 DIAGNOSIS — H25811 Combined forms of age-related cataract, right eye: Secondary | ICD-10-CM | POA: Diagnosis not present

## 2017-03-09 DIAGNOSIS — H43812 Vitreous degeneration, left eye: Secondary | ICD-10-CM | POA: Diagnosis not present

## 2017-03-11 ENCOUNTER — Ambulatory Visit: Payer: PPO | Admitting: Podiatry

## 2017-03-23 ENCOUNTER — Encounter: Payer: Self-pay | Admitting: Podiatry

## 2017-03-23 ENCOUNTER — Ambulatory Visit (INDEPENDENT_AMBULATORY_CARE_PROVIDER_SITE_OTHER): Payer: PPO | Admitting: Podiatry

## 2017-03-23 VITALS — Temp 98.7°F

## 2017-03-23 DIAGNOSIS — S91311D Laceration without foreign body, right foot, subsequent encounter: Secondary | ICD-10-CM

## 2017-03-23 NOTE — Progress Notes (Signed)
Complaint:  Visit Type: Patient returns to my office for valuation of her open laceration on the back of her right heel.  She was treated with Augmentin to prevent infection as well as an Haematologist to promote healing at the laceration site.  She presents the office today wearing the Unna boot and wearing her surgical shoe that was dispensed.  She states she is now having pain on the front of her ankle as she walks.  She presents today for continued evaluation and treatment of her laceration  Podiatric Exam: Vascular: dorsalis pedis and posterior tibial pulses are palpable bilateral. Capillary return is immediate. Temperature gradient is WNL. Skin turgor WNL  Sensorium: Normal Semmes Weinstein monofilament test. Normal tactile sensation bilaterally. Nail Exam: Pt has thick disfigured discolored nails with subungual debris noted bilateral entire nail hallux through fifth toenails Ulcer Exam: There is no evidence of ulcer or pre-ulcerative changes or infection. Orthopedic Exam: Muscle tone and strength are WNL. No limitations in general ROM. No crepitus or effusions noted. HAV  B/L. Skin: No Porokeratosis. No infection or ulcers.  Inflammation to skin noted along the course ATT.    Red hypertrophied skin at the incision site in the absence of drainage or infection.   Diagnosis:   Laceration right heel  Treatment & Plan Procedures and Treatment:  Removal of the Unna boot does reveal skin inflammation noted along the course of the anterior tibial tendon, right foot.  Healing noted at the laceration on the posterior aspect of the right.  Patient was instructed to continue to ambulate with the surgical shoe and return to regular shoes as tolerated.  Return Visit-Office Procedure: Patient instructed to return to the office for a follow up visit 4 weeks  for continued evaluation and treatment.    Gardiner Barefoot DPM

## 2017-04-11 DIAGNOSIS — E890 Postprocedural hypothyroidism: Secondary | ICD-10-CM | POA: Diagnosis not present

## 2017-04-11 DIAGNOSIS — R0989 Other specified symptoms and signs involving the circulatory and respiratory systems: Secondary | ICD-10-CM | POA: Diagnosis not present

## 2017-04-11 DIAGNOSIS — J432 Centrilobular emphysema: Secondary | ICD-10-CM | POA: Diagnosis not present

## 2017-04-11 DIAGNOSIS — I739 Peripheral vascular disease, unspecified: Secondary | ICD-10-CM | POA: Diagnosis not present

## 2017-04-11 DIAGNOSIS — Z6823 Body mass index (BMI) 23.0-23.9, adult: Secondary | ICD-10-CM | POA: Diagnosis not present

## 2017-04-11 DIAGNOSIS — E1151 Type 2 diabetes mellitus with diabetic peripheral angiopathy without gangrene: Secondary | ICD-10-CM | POA: Diagnosis not present

## 2017-04-11 DIAGNOSIS — D500 Iron deficiency anemia secondary to blood loss (chronic): Secondary | ICD-10-CM | POA: Diagnosis not present

## 2017-04-11 DIAGNOSIS — R918 Other nonspecific abnormal finding of lung field: Secondary | ICD-10-CM | POA: Diagnosis not present

## 2017-04-11 DIAGNOSIS — E784 Other hyperlipidemia: Secondary | ICD-10-CM | POA: Diagnosis not present

## 2017-04-11 DIAGNOSIS — I2510 Atherosclerotic heart disease of native coronary artery without angina pectoris: Secondary | ICD-10-CM | POA: Diagnosis not present

## 2017-04-11 DIAGNOSIS — E559 Vitamin D deficiency, unspecified: Secondary | ICD-10-CM | POA: Diagnosis not present

## 2017-04-18 DIAGNOSIS — Z1231 Encounter for screening mammogram for malignant neoplasm of breast: Secondary | ICD-10-CM | POA: Diagnosis not present

## 2017-04-28 ENCOUNTER — Other Ambulatory Visit: Payer: Self-pay | Admitting: Endocrinology

## 2017-04-28 DIAGNOSIS — R918 Other nonspecific abnormal finding of lung field: Secondary | ICD-10-CM

## 2017-05-24 ENCOUNTER — Other Ambulatory Visit: Payer: Self-pay | Admitting: Endocrinology

## 2017-05-24 DIAGNOSIS — E1151 Type 2 diabetes mellitus with diabetic peripheral angiopathy without gangrene: Secondary | ICD-10-CM | POA: Diagnosis not present

## 2017-05-24 DIAGNOSIS — R10814 Left lower quadrant abdominal tenderness: Secondary | ICD-10-CM | POA: Diagnosis not present

## 2017-05-24 DIAGNOSIS — E784 Other hyperlipidemia: Secondary | ICD-10-CM | POA: Diagnosis not present

## 2017-05-24 DIAGNOSIS — R1084 Generalized abdominal pain: Secondary | ICD-10-CM

## 2017-05-24 DIAGNOSIS — Z6823 Body mass index (BMI) 23.0-23.9, adult: Secondary | ICD-10-CM | POA: Diagnosis not present

## 2017-05-24 DIAGNOSIS — J432 Centrilobular emphysema: Secondary | ICD-10-CM | POA: Diagnosis not present

## 2017-05-24 DIAGNOSIS — E890 Postprocedural hypothyroidism: Secondary | ICD-10-CM | POA: Diagnosis not present

## 2017-05-24 DIAGNOSIS — D500 Iron deficiency anemia secondary to blood loss (chronic): Secondary | ICD-10-CM | POA: Diagnosis not present

## 2017-05-24 DIAGNOSIS — R109 Unspecified abdominal pain: Secondary | ICD-10-CM | POA: Diagnosis not present

## 2017-05-24 DIAGNOSIS — K5909 Other constipation: Secondary | ICD-10-CM | POA: Diagnosis not present

## 2017-05-24 DIAGNOSIS — R748 Abnormal levels of other serum enzymes: Secondary | ICD-10-CM | POA: Diagnosis not present

## 2017-05-24 DIAGNOSIS — K5900 Constipation, unspecified: Secondary | ICD-10-CM

## 2017-05-24 DIAGNOSIS — E559 Vitamin D deficiency, unspecified: Secondary | ICD-10-CM | POA: Diagnosis not present

## 2017-05-24 DIAGNOSIS — I7389 Other specified peripheral vascular diseases: Secondary | ICD-10-CM | POA: Diagnosis not present

## 2017-05-25 ENCOUNTER — Ambulatory Visit: Payer: PPO | Admitting: Podiatry

## 2017-05-27 ENCOUNTER — Ambulatory Visit
Admission: RE | Admit: 2017-05-27 | Discharge: 2017-05-27 | Disposition: A | Discharge status: Home / Self Care | Payer: PPO | Source: Ambulatory Visit | Attending: Endocrinology | Admitting: Endocrinology

## 2017-05-27 DIAGNOSIS — N200 Calculus of kidney: Secondary | ICD-10-CM | POA: Diagnosis not present

## 2017-05-27 DIAGNOSIS — R1084 Generalized abdominal pain: Secondary | ICD-10-CM

## 2017-05-27 DIAGNOSIS — K5900 Constipation, unspecified: Secondary | ICD-10-CM

## 2017-05-27 MED ORDER — IOPAMIDOL (ISOVUE-300) INJECTION 61%
100.0000 mL | Freq: Once | INTRAVENOUS | Status: AC | PRN
  Administered 2017-05-27: 100 mL via INTRAVENOUS

## 2017-08-08 ENCOUNTER — Ambulatory Visit (INDEPENDENT_AMBULATORY_CARE_PROVIDER_SITE_OTHER): Payer: PPO | Admitting: Gastroenterology

## 2017-08-08 ENCOUNTER — Encounter: Payer: Self-pay | Admitting: Gastroenterology

## 2017-08-08 VITALS — BP 130/80 | HR 78 | Ht 63.0 in | Wt 135.0 lb

## 2017-08-08 DIAGNOSIS — K219 Gastro-esophageal reflux disease without esophagitis: Secondary | ICD-10-CM | POA: Diagnosis not present

## 2017-08-08 DIAGNOSIS — Z1211 Encounter for screening for malignant neoplasm of colon: Secondary | ICD-10-CM

## 2017-08-08 DIAGNOSIS — R1310 Dysphagia, unspecified: Secondary | ICD-10-CM | POA: Diagnosis not present

## 2017-08-08 MED ORDER — NA SULFATE-K SULFATE-MG SULF 17.5-3.13-1.6 GM/177ML PO SOLN: 1.0000 | Freq: Once | ORAL | 0 refills | Status: AC

## 2017-08-08 NOTE — Progress Notes (Signed)
Review of pertinent gastrointestinal problems:  1. routine risk for colon cancer, colonoscopy February 2009 , Dr. Ardis Hughs, left-sided diverticulosis without any polyps. Recall colonoscopy at 10 year interval 2. Mild gastritis, HH on EGD 01/2013 Ardis Hughs, done for thickened distal esophagus on CT. h pylori negative.   HPI: This is a very pleasant 71 year old woman.  She was here in the office 4 months ago and saw Amy.  She had a variety of upper and lower GI symptoms including nausea, heartburn, constipation, right lower quadrant abdominal pain.  She was recommended to start ranitidine 150 mg pill 1 pill twice daily, amitiza 9mcg samples were given (bid), cipro 500 bid for 7 days.  She was recommended to call back if her symptoms were not improving after 7-10 days.  We have not heard from her since.  It looks like her primary care physician ordered a CT scan abdomen and pelvis with IV contrast for abdominal pain, constipation.  October 2018.  This was essentially normal except for "moderate amount of stool throughout the colon", lumbar spine spondylosis, bilateral nonobstructing nephrolithiasis.  She has sensation of dysphagia, choking, then coughing with solids and liquids. She has been gaining weight.  Her dysphasia-like symptoms occurs 3 or 4 times a week.  She has GERD especially overnight.  SHe was on zantac twice daily, before meals.   Worst at night.  She is taking a certain pill from her PCP that she takes once per week.  This is apparently helping her constipation.  It is better than MiraLAX.  She does not recall the name of it  She does not remember trying amitiza samples   Which we gave to her 6 months ago at the time of her visit.  Chief complaint is dysphasia, chronic constipation, GERD  ROS: complete GI ROS as described in HPI, all other review negative.  Constitutional:  No unintentional weight loss   Past Medical History:  Diagnosis Date  . Anemia   . Anxiety    hx  .  Arthritis    "my whole body"  . Atrial septal aneurysm   . COLONIC POLYPS, HX OF   . COPD (chronic obstructive pulmonary disease) ([place])    no per pt on 03/21/2014  . Coronary artery disease    a. s/p CABG 2012.  . Diabetes mellitus ([place])   . DISC DISEASE, CERVICAL   . Gastroesophageal reflux disease   . Heart murmur    as a child  . History of rheumatic fever   . Hyperlipidemia   . Hypertension   . Hypothyroidism   . Insomnia   . Osteoarthritis   . OSTEOPENIA   . PAD (peripheral artery disease) ([place])    a. PTA to R SFA 2015. b. PTA to L SFA 05/2016.  Marland Kitchen RSD (reflex sympathetic dystrophy) 10/31/2011  . Vitamin D deficiency     Past Surgical History:  Procedure Laterality Date  . ABDOMINAL HYSTERECTOMY  1977   "partial"  . BALLOON ANGIOPLASTY, ARTERY Right 03/21/2014   SFA  . CARDIAC CATHETERIZATION  2011   . CARPAL TUNNEL RELEASE Right 2005  . CATARACT EXTRACTION Left 2013  . CORONARY ARTERY BYPASS GRAFT  10/2009   LIMA to the LAD, saphenous vein graft to the acute marginal, saphenous vein graft to the diagonal and obtuse marginal.  . DILATION AND CURETTAGE OF UTERUS  1978  . JOINT REPLACEMENT    . LOWER EXTREMITY ANGIOGRAM N/A 03/21/2014   Procedure: LOWER EXTREMITY ANGIOGRAM;  Surgeon: Lorretta Harp,  MD;  Location: [facility];  Service: Cardiovascular;  Laterality: N/A;  . LOWER EXTREMITY ANGIOGRAM  06/07/2016   Abdominal aortogram/bilateral iliac angiogram/bifemoral runoff  . PERIPHERAL VASCULAR CATHETERIZATION Bilateral 06/07/2016   Procedure: Lower Extremity Angiography;  Surgeon: Lorretta Harp, MD;  Location: [facility];  Service: Cardiovascular;  Laterality: Bilateral;  . PERIPHERAL VASCULAR CATHETERIZATION Left 06/07/2016   Procedure: Peripheral Vascular Atherectomy;  Surgeon: Lorretta Harp, MD;  Location: [facility];  Service: Cardiovascular;  Laterality: Left;  SFA  . PERIPHERAL VASCULAR CATHETERIZATION Left 06/07/2016   Procedure:  Peripheral Vascular Balloon Angioplasty;  Surgeon: Lorretta Harp, MD;  Location: [facility];  Service: Cardiovascular;  Laterality: Left;  SFA  . THYROIDECTOMY  1995  . TONSILLECTOMY    . TOTAL KNEE ARTHROPLASTY Right 06/18/2014   Procedure: RIGHT TOTAL KNEE ARTHROPLASTY;  Surgeon: Tobi Bastos, MD;  Location: [facility];  Service: Orthopedics;  Laterality: Right;    Current Outpatient Medications  Medication Sig Dispense Refill  . amLODipine (NORVASC) 10 MG tablet TAKE ONE TABLET BY MOUTH ONCE DAILY 90 tablet 1  . aspirin EC 81 MG tablet Take 81 mg by mouth daily.    . bacitracin ointment Apply 1 application topically 2 (two) times daily. 120 g 0  . Calcium Carb-Cholecalciferol (CALCIUM 1000 + D PO) Take 1 tablet by mouth daily at 2 PM daily at 2 PM.     . co-enzyme Q-10 30 MG capsule Take 30 mg by mouth daily.     . ergocalciferol (DRISDOL) 8000 UNIT/ML drops Take 6.3 mLs (50,000 Units total) by mouth once a week. 60 mL 12  . hydrochlorothiazide (HYDRODIURIL) 25 MG tablet Take 25 mg by mouth daily after lunch. Reported on 09/24/2015    . levothyroxine (SYNTHROID, LEVOTHROID) 125 MCG tablet Take 1 tablet (125 mcg total) by mouth daily. 90 tablet 3  . Multiple Vitamin (MULTIVITAMIN WITH MINERALS) TABS tablet Take 1 tablet by mouth every morning. Centrum    . tiZANidine (ZANAFLEX) 4 MG tablet Take 1 tablet (4 mg total) by mouth at bedtime. (Patient taking differently: Take 4 mg by mouth at bedtime as needed. ) 30 tablet 0  . Vitamin D, Ergocalciferol, (DRISDOL) 50000 units CAPS capsule Take 50,000 Units by mouth every Tuesday.     No current facility-administered medications for this visit.    Facility-Administered Medications Ordered in Other Visits  Medication Dose Route Frequency Provider Last Rate Last Dose  . tranexamic acid (CYKLOKAPRON) 2,000 mg in sodium chloride 0.9 % 50 mL Topical Application  1,610 mg Topical Once Constable, Amber, PA-C        Allergies as of 08/08/2017  - Review Complete 08/08/2017  Allergen Reaction Noted  . Lipitor [atorvastatin] Other (See Comments) 04/30/2014    Family History  Problem Relation Age of Onset  . Arthritis Maternal Aunt   . Heart disease Maternal Aunt   . Hypertension Maternal Aunt   . Diabetes Maternal Aunt   . Colon cancer Neg Hx   . Kidney disease Neg Hx   . Liver disease Neg Hx   . Throat cancer Neg Hx   . Stomach cancer Neg Hx     Social History   Socioeconomic History  . Marital status: Single    Spouse name: Not on file  . Number of children: 2  . Years of education: 64  . Highest education level: Not on file  Social Needs  . Financial resource strain: Not on file  .  Food insecurity - worry: Not on file  . Food insecurity - inability: Not on file  . Transportation needs - medical: Not on file  . Transportation needs - non-medical: Not on file  Occupational History  . Occupation: Retired    Fish farm manager: RETIRED  Tobacco Use  . Smoking status: Former Smoker    Packs/day: 0.80    Years: 45.00    Pack years: 36.00    Types: Cigarettes    Last attempt to quit: 08/23/2009    Years since quitting: 7.9  . Smokeless tobacco: Never Used  Substance and Sexual Activity  . Alcohol use: No    Alcohol/week: 0.0 oz    Comment: WINE OCC.  . Drug use: No  . Sexual activity: Not on file  Other Topics Concern  . Not on file  Social History Narrative  . Not on file     Physical Exam: BP 130/80   Pulse 78   Ht 5\' 3"  (1.6 m)   Wt 135 lb (61.2 kg)   BMI 23.91 kg/m  Constitutional: generally well-appearing Psychiatric: alert and oriented x3 Abdomen: soft, nontender, nondistended, no obvious ascites, no peritoneal signs, normal bowel sounds No peripheral edema noted in lower extremities  Assessment and plan: 71 y.o. female with chronic constipation, chronic GERD, mild intermittent dysphasia.  First her last screening colonoscopy was February 2009.  I recommended repeat colon cancer screening with a  colonoscopy in the next 1-2 months.  At the same time I think EGD is reasonable to investigate her dysphasia-like symptoms.  She has been gaining weight so I think it is unlikely anything serious.  I also recommended she change the way she is taking her H2 blocker so that she takes 1 of the pills at bedtime every night and the other shortly before her breakfast meal.  It seems that her GERD-like, almost water brash symptom is worse at night and I think bedtime dosing of H2 blocker may help her quite a lot.  She has chronic constipation.  She does not recall ever trying the amitiza samples which we supplied for her 6 months ago.  She does not like the way MiraLAX taste and says it does not help.  Fortunately she has found some type of a pill through her primary care physician that she only needs to take once per week and it is fairly effective.  She does not recall its name.  I recommended she continue taking it.  Please see the "Patient Instructions" section for addition details about the plan.  Owens Loffler, MD [facility] 08/08/2017, 2:54 PM

## 2017-08-08 NOTE — Patient Instructions (Addendum)
You will be set up for a colonoscopy for routine screening (last was 09/2007)  You will be set up for an upper endoscopy for dysphagia, GERD.  Please change your zantac150 dosing: you should take one pill with breakfast and one pill at bedtime  Normal BMI (Body Mass Index- based on height and weight) is between 23 and 30. Your BMI today is Body mass index is 23.91 kg/m. Marland Kitchen Please consider follow up  regarding your BMI with your Primary Care Provider.

## 2017-08-09 ENCOUNTER — Encounter: Payer: Self-pay | Admitting: Sports Medicine

## 2017-08-09 ENCOUNTER — Ambulatory Visit: Payer: PPO | Admitting: Sports Medicine

## 2017-08-09 VITALS — BP 141/70 | HR 75 | Resp 16

## 2017-08-09 DIAGNOSIS — M79676 Pain in unspecified toe(s): Secondary | ICD-10-CM

## 2017-08-09 DIAGNOSIS — M79672 Pain in left foot: Secondary | ICD-10-CM

## 2017-08-09 DIAGNOSIS — M79671 Pain in right foot: Secondary | ICD-10-CM

## 2017-08-09 DIAGNOSIS — E119 Type 2 diabetes mellitus without complications: Secondary | ICD-10-CM

## 2017-08-09 DIAGNOSIS — B351 Tinea unguium: Secondary | ICD-10-CM

## 2017-08-09 NOTE — Progress Notes (Signed)
Patient ID: [name], female   DOB: Oct 15, 1945, 71 y.o.   MRN: [id number] Subjective: [name] is a 71 y.o. female patient with history of diabetes who presents to office today complaining of long, painful nails  while ambulating in shoes; unable to trim. Patient states that the glucose reading this morning was 120mg /dl. Patient denies any new changes in medication or new problems.   Patient Active Problem List   Diagnosis Date Noted  . CAD in native artery 06/08/2016  . Skin inflammation 04/06/2016  . Contracture of hand joint 04/06/2016  . Microhematuria 11/12/2015  . Left shoulder pain 10/15/2015  . Trapezius muscle spasm 09/24/2015  . Type 2 diabetes mellitus ([place]) 08/07/2015  . Urinary frequency 04/04/2015  . External hemorrhoid 11/02/2014  . Left-sided thoracic back pain 10/03/2014  . Gingivitis 10/03/2014  . Routine general medical examination at a health care facility 09/10/2014  . Total knee replacement status 06/18/2014  . Tooth pain 04/18/2014  . Claudication ([place]) 03/21/2014  . Primary localized osteoarthrosis, lower leg 08/10/2013  . Right knee pain 08/02/2013  . Upper respiratory infection 08/02/2013  . Dyspepsia 11/16/2012  . Knee pain, right 02/18/2012  . PAD (peripheral artery disease) ([place]) 12/03/2011  . PVD (peripheral vascular disease) ([place]) 10/31/2011  . RSD (reflex sympathetic dystrophy) 10/31/2011  . Vitamin D deficiency   . Preop exam for internal medicine 10/28/2011  . Anxiety 03/11/2011  . COPD (chronic obstructive pulmonary disease) ([place]) 02/02/2011  . Anemia 02/02/2011  . History of rheumatic fever 02/02/2011  . Insomnia 02/02/2011  . Coronary atherosclerosis 09/08/2010  . CONSTIPATION 02/19/2010  . TOBACCO ABUSE 07/21/2009  . MURMUR 06/17/2009  . [place] DISEASE, CERVICAL 03/20/2009  . Pain in Soft Tissues of Limb 05/23/2008  . Hypothyroidism 09/07/2007  . Hyperlipidemia LDL goal <70 09/07/2007  . Essential hypertension 09/07/2007   . ALLERGIC RHINITIS 09/07/2007  . GERD 09/07/2007  . Primary osteoarthritis of right knee 09/07/2007  . Osteopenia 09/07/2007  . COLONIC POLYPS, HX OF 09/07/2007   Current Outpatient Medications on File Prior to Visit  Medication Sig Dispense Refill  . amLODipine (NORVASC) 10 MG tablet TAKE ONE TABLET BY MOUTH ONCE DAILY 90 tablet 1  . aspirin EC 81 MG tablet Take 81 mg by mouth daily.    . bacitracin ointment Apply 1 application topically 2 (two) times daily. 120 g 0  . Calcium Carb-Cholecalciferol (CALCIUM 1000 + D PO) Take 1 tablet by mouth daily at 2 PM daily at 2 PM.     . co-enzyme Q-10 30 MG capsule Take 30 mg by mouth daily.     . ergocalciferol (DRISDOL) 8000 UNIT/ML drops Take 6.3 mLs (50,000 Units total) by mouth once a week. 60 mL 12  . hydrochlorothiazide (HYDRODIURIL) 25 MG tablet Take 25 mg by mouth daily after lunch. Reported on 09/24/2015    . levothyroxine (SYNTHROID, LEVOTHROID) 125 MCG tablet Take 1 tablet (125 mcg total) by mouth daily. 90 tablet 3  . Multiple Vitamin (MULTIVITAMIN WITH MINERALS) TABS tablet Take 1 tablet by mouth every morning. Centrum    . ranitidine (ZANTAC) 150 MG capsule Take 1 capsule (150 mg total) by mouth 2 (two) times daily.    Marland Kitchen tiZANidine (ZANAFLEX) 4 MG tablet Take 1 tablet (4 mg total) by mouth at bedtime. (Patient taking differently: Take 4 mg by mouth at bedtime as needed. ) 30 tablet 0  . Vitamin D, Ergocalciferol, (DRISDOL) 50000 units CAPS capsule Take 50,000 Units by mouth every Tuesday.  Current Facility-Administered Medications on File Prior to Visit  Medication Dose Route Frequency Provider Last Rate Last Dose  . tranexamic acid (CYKLOKAPRON) 2,000 mg in sodium chloride 0.9 % 50 mL Topical Application  7,290 mg Topical Once Cecilio Asper, [employer], PA-C       Allergies  Allergen Reactions  . Lipitor [Atorvastatin] Other (See Comments)    Muscle aches    No results found for this or any previous visit (from the past 2160  hour(s)).  Objective: General: Patient is awake, alert, and oriented x 3 and in no acute distress.  Integument: Skin is warm, dry and supple bilateral. Nails are tender, long, thickened and  dystrophic with subungual debris, consistent with onychomycosis, 1-5 bilateral. No signs of infection. No open lesions or preulcerative lesions present bilateral. Remaining integument unremarkable.  Vasculature:  Dorsalis Pedis pulse 2/4 bilateral. Posterior Tibial pulse  1/4 bilateral.  Capillary fill time <3 sec 1-5 bilateral. Positive hair growth to the level of the digits. Temperature gradient within normal limits. No varicosities present bilateral. No edema present bilateral.   Neurology: The patient has intact sensation measured with a 5.07/10g Semmes Weinstein Monofilament at all pedal sites bilateral . Vibratory sensation intact bilateral with tuning fork. No Babinski sign present bilateral.   Musculoskeletal: Asymptomatic bunion and hammertoe gross pedal deformities noted bilateral. Muscular strength 5/5 in all lower extremity muscular groups bilateral without pain on range of motion . No tenderness with calf compression bilateral.  Assessment and Plan: Problem List Items Addressed This Visit    None    Visit Diagnoses    Pain due to onychomycosis of toenail    -  Primary   Diabetes mellitus without complication (HCC)       Foot pain, bilateral         -Examined patient. -Discussed and educated patient on diabetic foot care, especially with  regards to the vascular, neurological and musculoskeletal systems.  -Stressed the importance of good glycemic control and the detriment of not  controlling glucose levels in relation to the foot. -Mechanically debrided all nails 1-5 bilateral using sterile nail nipper and filed with dremel without incident  -Recommend continue with good supportive shoes daily for foot type -Answered all patient questions -Patient to return in 3 months for at risk  foot care -Patient advised to call the office if any problems or questions arise in the meantime.  Landis Martins, DPM

## 2017-08-15 ENCOUNTER — Telehealth: Payer: Self-pay | Admitting: Gastroenterology

## 2017-08-17 NOTE — Telephone Encounter (Signed)
No more than $50 coupon sent to the pharmacy for prep.  Pt advised.

## 2017-08-22 DIAGNOSIS — R0989 Other specified symptoms and signs involving the circulatory and respiratory systems: Secondary | ICD-10-CM | POA: Diagnosis not present

## 2017-08-22 DIAGNOSIS — D500 Iron deficiency anemia secondary to blood loss (chronic): Secondary | ICD-10-CM | POA: Diagnosis not present

## 2017-08-22 DIAGNOSIS — I739 Peripheral vascular disease, unspecified: Secondary | ICD-10-CM | POA: Diagnosis not present

## 2017-08-22 DIAGNOSIS — E559 Vitamin D deficiency, unspecified: Secondary | ICD-10-CM | POA: Diagnosis not present

## 2017-08-22 DIAGNOSIS — E1151 Type 2 diabetes mellitus with diabetic peripheral angiopathy without gangrene: Secondary | ICD-10-CM | POA: Diagnosis not present

## 2017-08-22 DIAGNOSIS — R109 Unspecified abdominal pain: Secondary | ICD-10-CM | POA: Diagnosis not present

## 2017-08-22 DIAGNOSIS — E7849 Other hyperlipidemia: Secondary | ICD-10-CM | POA: Diagnosis not present

## 2017-08-22 DIAGNOSIS — E890 Postprocedural hypothyroidism: Secondary | ICD-10-CM | POA: Diagnosis not present

## 2017-08-22 DIAGNOSIS — I2510 Atherosclerotic heart disease of native coronary artery without angina pectoris: Secondary | ICD-10-CM | POA: Diagnosis not present

## 2017-08-22 DIAGNOSIS — J432 Centrilobular emphysema: Secondary | ICD-10-CM | POA: Diagnosis not present

## 2017-09-16 ENCOUNTER — Encounter: Payer: Self-pay | Admitting: Gastroenterology

## 2017-09-30 ENCOUNTER — Encounter: Payer: PPO | Admitting: Gastroenterology

## 2017-10-04 ENCOUNTER — Other Ambulatory Visit: Payer: Self-pay | Admitting: *Deleted

## 2017-10-04 DIAGNOSIS — R918 Other nonspecific abnormal finding of lung field: Secondary | ICD-10-CM

## 2017-10-07 DIAGNOSIS — K5904 Chronic idiopathic constipation: Secondary | ICD-10-CM | POA: Insufficient documentation

## 2017-10-19 ENCOUNTER — Other Ambulatory Visit: Payer: Self-pay | Admitting: Internal Medicine

## 2017-11-08 ENCOUNTER — Other Ambulatory Visit: Payer: Self-pay

## 2017-11-08 ENCOUNTER — Ambulatory Visit: Payer: PPO

## 2017-11-08 ENCOUNTER — Ambulatory Visit
Admission: RE | Admit: 2017-11-08 | Discharge: 2017-11-08 | Disposition: A | Discharge status: Home / Self Care | Payer: Medicare HMO | Source: Ambulatory Visit | Attending: Thoracic Surgery (Cardiothoracic Vascular Surgery) | Admitting: Thoracic Surgery (Cardiothoracic Vascular Surgery)

## 2017-11-08 ENCOUNTER — Encounter: Payer: Self-pay | Admitting: Thoracic Surgery (Cardiothoracic Vascular Surgery)

## 2017-11-08 ENCOUNTER — Ambulatory Visit: Payer: Medicare HMO | Admitting: Thoracic Surgery (Cardiothoracic Vascular Surgery)

## 2017-11-08 VITALS — BP 145/77 | HR 77 | Resp 18 | Ht 63.0 in | Wt 130.8 lb

## 2017-11-08 DIAGNOSIS — R918 Other nonspecific abnormal finding of lung field: Secondary | ICD-10-CM

## 2017-11-08 NOTE — Progress Notes (Signed)
[address]       [place] [address]             [phone]     HPI: [name] returns for scheduled one-year follow-up visit  [name] is a 72 year old woman with a past medical history significant for tobacco abuse, multiple lung nodules, COPD, coronary artery disease, status post coronary artery bypass grafting in 2012, hypertension, and gastroesophageal reflux.  She had a CT of her cervical spine back in 2011.  It showed a possible right apical mass.  CT of the chest showed bilateral apical parenchymal densities.  They were not hypermetabolic on PET.  She also had multiple other small 3-4 mm lung nodules.  We have been following her with CTs since then.  The changes were stable over 2 years.  She does meet criteria for low-dose CT screening, so we have continued with annual CT scans.  She says recently she has been experiencing some chest tightness and shortness of breath with exertion.  It does not happen all the time but it has been noticeable.  She has not seen Dr. Stanford Breed recently.  She quit smoking in 2011.  Past Medical History:  Diagnosis Date  . Anemia   . Anxiety    hx  . Arthritis    "my whole body"  . Atrial septal aneurysm   . COLONIC POLYPS, HX OF   . COPD (chronic obstructive pulmonary disease) ([place])    no per pt on 03/21/2014  . Coronary artery disease    a. s/p CABG 2012.  . Diabetes mellitus ([place])   . DISC DISEASE, CERVICAL   . Gastroesophageal reflux disease   . Heart murmur    as a child  . History of rheumatic fever   . Hyperlipidemia   . Hypertension   . Hypothyroidism   . Insomnia   . Osteoarthritis   . OSTEOPENIA   . PAD (peripheral artery disease) ([place])    a. PTA to R SFA 2015. b. PTA to L SFA 05/2016.  Marland Kitchen RSD (reflex sympathetic dystrophy) 10/31/2011  . Vitamin D deficiency     Current Outpatient Medications  Medication Sig Dispense Refill  . amLODipine (NORVASC) 10 MG tablet TAKE ONE TABLET BY MOUTH ONCE DAILY  90 tablet 1  . aspirin EC 81 MG tablet Take 81 mg by mouth daily.    . Calcium Carb-Cholecalciferol (CALCIUM 1000 + D PO) Take 1 tablet by mouth daily at 2 PM daily at 2 PM.     . co-enzyme Q-10 30 MG capsule Take 30 mg by mouth daily.     . hydrochlorothiazide (HYDRODIURIL) 25 MG tablet Take 25 mg by mouth daily after lunch. Reported on 09/24/2015    . levothyroxine (SYNTHROID, LEVOTHROID) 125 MCG tablet Take 1 tablet (125 mcg total) by mouth daily. 90 tablet 3  . Multiple Vitamin (MULTIVITAMIN WITH MINERALS) TABS tablet Take 1 tablet by mouth every morning. Centrum    . omeprazole (PRILOSEC) 40 MG capsule Take by mouth.    Marland Kitchen tiZANidine (ZANAFLEX) 4 MG tablet Take 1 tablet (4 mg total) by mouth at bedtime. (Patient taking differently: Take 4 mg by mouth at bedtime as needed. ) 30 tablet 0  . Vitamin D, Ergocalciferol, (DRISDOL) 50000 units CAPS capsule Take 50,000 Units by mouth every Tuesday.     No current facility-administered medications for this visit.    Facility-Administered Medications Ordered in Other Visits  Medication Dose Route Frequency Provider Last  Rate Last Dose  . tranexamic acid (CYKLOKAPRON) 2,000 mg in sodium chloride 0.9 % 50 mL Topical Application  4,010 mg Topical Once Constable, [employer], PA-C        Physical Exam BP (!) 145/77 (BP Location: Left Arm, Patient Position: Sitting, Cuff Size: Normal)   Pulse 77   Resp 18   Ht 5\' 3"  (1.6 m)   Wt 130 lb 12.8 oz (59.3 kg)   SpO2 98% Comment: RA  BMI 23.13 kg/m  72 year old woman in no acute distress Well-developed well-nourished Alert and oriented x3 No cervical or supra clavicular adenopathy Lungs clear bilaterally Cardiac regular rate and rhythm normal S1-S2  Diagnostic Tests: CT CHEST WITHOUT CONTRAST  TECHNIQUE: Multidetector CT imaging of the chest was performed following the standard protocol without IV contrast.  COMPARISON:  Chest CT 11/09/2016.  FINDINGS: Cardiovascular: Heart size is normal. There  is no significant pericardial fluid, thickening or pericardial calcification. There is aortic atherosclerosis, as well as atherosclerosis of the great vessels of the mediastinum and the coronary arteries, including calcified atherosclerotic plaque in the left main, left anterior descending, left circumflex and right coronary arteries. Status post median sternotomy for CABG including LIMA to the LAD.  Mediastinum/Nodes: No pathologically enlarged mediastinal or hilar lymph nodes. Please note that accurate exclusion of hilar adenopathy is limited on noncontrast CT scans. Esophagus is unremarkable in appearance. No axillary lymphadenopathy.  Lungs/Pleura: Multiple small pulmonary nodules are again noted throughout the right lung, largest of which measures only 4 mm, stable in size, number and distribution dating back to 2011, considered definitively benign. No other new suspicious appearing pulmonary nodules or masses are noted. Nodular areas of pleuroparenchymal thickening and architectural distortion in the apices of the lungs bilaterally is stable compared to prior examinations, most compatible with areas of chronic post infectious or inflammatory changes. Diffuse bronchial wall thickening with moderate centrilobular and paraseptal emphysema. No acute consolidative airspace disease. No pleural effusions.  Upper Abdomen: Aortic atherosclerosis. Nonobstructive calculi in the upper pole collecting system of left kidney measuring up to 4 mm. 1.9 cm low-attenuation lesion incompletely imaged in the right lobe of the liver, not characterized on today's noncontrast CT examination, but previously characterized as a simple cyst.  Musculoskeletal: Median sternotomy wires. There are no aggressive appearing lytic or blastic lesions noted in the visualized portions of the skeleton.  IMPRESSION: 1. No acute findings are noted in the thorax. 2. Aortic atherosclerosis, in addition to left  main and 3 vessel coronary artery disease. Status post median sternotomy for CABG including LIMA to the LAD. 3. Multiple tiny pulmonary nodules are noted in the right lung, stable compared to prior studies dating back to 2011, considered definitively benign requiring no future imaging follow-up. 4. Mild diffuse bronchial wall thickening with moderate centrilobular and paraseptal emphysema; imaging findings suggestive of underlying COPD. 5. Additional incidental findings, as above.  Aortic Atherosclerosis (ICD10-I70.0) and Emphysema (ICD10-J43.9).   Electronically Signed   By: Vinnie Langton M.D.   On: 11/08/2017 11:47 I personally reviewed the CT chest and concur with the findings noted above.  She does not need further follow-up for these lesions, but does need continued low-dose CT screening  Impression: Mr. Pesantez is a 72 year old woman with a history of tobacco abuse, COPD, coronary disease, previous coronary bypass grafting, hypertension, and reflux.  Lung nodules and history of tobacco abuse-quit smoking in 2011.  She meets criteria for low-dose CT screening.  I reviewed her CT scan did not see any significant change  from her scan a year ago.  She needs continued annual screening.  Coronary artery disease-had CABG in 2012.  She complains of some chest tightness and shortness of breath with exertion.  She also has some indigestion at night when she lies down.  I think she needs to be evaluated by Dr. Stanford Breed.  She may need a stress test or other studies done.  I will have my office arrange an appointment for her.  Hypertension-blood pressure elevated.  Follow-up with primary  Plan: Return in 1 year with low dose CT chest  Melrose Nakayama, MD [facility] [phone]

## 2017-11-14 ENCOUNTER — Encounter: Payer: Self-pay | Admitting: Adult Health

## 2017-11-14 ENCOUNTER — Ambulatory Visit: Payer: Medicare HMO | Admitting: Adult Health

## 2017-11-14 VITALS — BP 126/78 | HR 74 | Ht 63.0 in | Wt 131.4 lb

## 2017-11-14 DIAGNOSIS — Z79899 Other long term (current) drug therapy: Secondary | ICD-10-CM

## 2017-11-14 DIAGNOSIS — I10 Essential (primary) hypertension: Secondary | ICD-10-CM

## 2017-11-14 DIAGNOSIS — I2510 Atherosclerotic heart disease of native coronary artery without angina pectoris: Secondary | ICD-10-CM | POA: Diagnosis not present

## 2017-11-14 DIAGNOSIS — R079 Chest pain, unspecified: Secondary | ICD-10-CM | POA: Diagnosis not present

## 2017-11-14 DIAGNOSIS — E7800 Pure hypercholesterolemia, unspecified: Secondary | ICD-10-CM

## 2017-11-14 DIAGNOSIS — E039 Hypothyroidism, unspecified: Secondary | ICD-10-CM | POA: Diagnosis not present

## 2017-11-14 DIAGNOSIS — I25119 Atherosclerotic heart disease of native coronary artery with unspecified angina pectoris: Secondary | ICD-10-CM | POA: Diagnosis not present

## 2017-11-14 NOTE — Patient Instructions (Signed)
Medication Instructions:  NO CHANGES- Your physician recommends that you continue on your current medications as directed. Please refer to the Current Medication list given to you today.  If you need a refill on your cardiac medications before your next appointment, please call your pharmacy.  Labwork: BMET,CBC. MAG,TSH,LIPID AND LFT TODAY HERE IN OUR OFFICE AT [facility]  Testing/Procedures: Your physician has requested that you have a lexiscan myoview.A cardiac stress test is a cardiological test that measures the heart's ability to respond to external stress in a controlled clinical environment. The stress response is induced byintravenous pharmacological stimulation. For further information please visit [email or web address]. Please follow instruction sheet, as given.  Follow-Up: Your physician wants you to follow-up in: [place] ([place]), DNP.   Thank you for choosing [facility] at [facility]!!

## 2017-11-14 NOTE — Progress Notes (Signed)
Cardiology Office Note   Date:  11/14/2017   ID:  [name], DOB 11-May-1946, MRN [id number]  PCP:  Reynold Bowen, MD  Cardiologist: Dr. Stanford Breed PAD Cardiologist:: Dr. Gwenlyn Found   Chief Complaint  Patient presents with  . Chest Pain  . Shortness of Breath     History of Present Illness: [name] is a 72 y.o. female who presents for ongoing assessment and management of severe three-vessel coronary artery disease, (LIMA to LAD, SVG to OM, and SVG to diagonal-2011) status post CABG by Dr. Roxan Hockey in 2011, peripheral arterial disease followed by Dr. Gwenlyn Found, medically managed stable claudication, chronic GERD symptoms with EGD by Dr. Ardis Hughs revealing a small hiatal hernia and distal esophagitis.  The patient also has chronic knee pain status post TKR.  She had usual 84-monthfollow-up appointment with Dr. HRoxan Hockey and complained of heavy feeling and tightness under her left breast.  Nonradiating.  She is also been complaining of more shortness of breath walking approximately 200 feet, more tired than usual, and of late she reports lightheadedness and dizziness after taking her medications in the morning.  She denies any near syncope.  If she sits down for a while it gets better.  She is being followed by her primary care physician for hypothyroidism, and is been on thyroid replacement medication, levothyroxine 125 mcg.  However, over the last month that has been increased to 150 mcg daily.  Past Medical History:  Diagnosis Date  . Anemia   . Anxiety    hx  . Arthritis    "my whole body"  . Atrial septal aneurysm   . COLONIC POLYPS, HX OF   . COPD (chronic obstructive pulmonary disease) ([place]    no per pt on 03/21/2014  . Coronary artery disease    a. s/p CABG 2012.  . Diabetes mellitus ([place]   . DISC DISEASE, CERVICAL   . Gastroesophageal reflux disease   . Heart murmur    as a child  . History of rheumatic fever   . Hyperlipidemia   . Hypertension   .  Hypothyroidism   . Insomnia   . Osteoarthritis   . OSTEOPENIA   . PAD (peripheral artery disease) ([place]    a. PTA to R SFA 2015. b. PTA to L SFA 05/2016.  .Marland KitchenRSD (reflex sympathetic dystrophy) 10/31/2011  . Vitamin D deficiency     Past Surgical History:  Procedure Laterality Date  . ABDOMINAL HYSTERECTOMY  1977   "partial"  . BALLOON ANGIOPLASTY, ARTERY Right 03/21/2014   SFA  . CARDIAC CATHETERIZATION  2011   . CARPAL TUNNEL RELEASE Right 2005  . CATARACT EXTRACTION Left 2013  . CORONARY ARTERY BYPASS GRAFT  10/2009   LIMA to the LAD, saphenous vein graft to the acute marginal, saphenous vein graft to the diagonal and obtuse marginal.  . DILATION AND CURETTAGE OF UTERUS  1978  . JOINT REPLACEMENT    . LOWER EXTREMITY ANGIOGRAM N/A 03/21/2014   Procedure: LOWER EXTREMITY ANGIOGRAM;  Surgeon: JLorretta Harp MD;  [facility];  Service: Cardiovascular;  Laterality: N/A;  . LOWER EXTREMITY ANGIOGRAM  06/07/2016   Abdominal aortogram/bilateral iliac angiogram/bifemoral runoff  . PERIPHERAL VASCULAR CATHETERIZATION Bilateral 06/07/2016   Procedure: Lower Extremity Angiography;  Surgeon: JLorretta Harp MD;  [facility];  Service: Cardiovascular;  Laterality: Bilateral;  . PERIPHERAL VASCULAR CATHETERIZATION Left 06/07/2016   Procedure: Peripheral Vascular Atherectomy;  Surgeon: JLorretta Harp MD;  [facility];  Service: Cardiovascular;  Laterality: Left;  SFA  . PERIPHERAL VASCULAR CATHETERIZATION Left 06/07/2016   Procedure: Peripheral Vascular Balloon Angioplasty;  Surgeon: Lorretta Harp, MD;  Location: [place] CV LAB;  Service: Cardiovascular;  Laterality: Left;  SFA  . THYROIDECTOMY  1995  . TONSILLECTOMY    . TOTAL KNEE ARTHROPLASTY Right 06/18/2014   Procedure: RIGHT TOTAL KNEE ARTHROPLASTY;  Surgeon: Tobi Bastos, MD;  [facility];  Service: Orthopedics;  Laterality: Right;     Current Outpatient Medications    Medication Sig Dispense Refill  . amLODipine (NORVASC) 10 MG tablet TAKE ONE TABLET BY MOUTH ONCE DAILY 90 tablet 1  . aspirin EC 81 MG tablet Take 81 mg by mouth daily.    . Calcium Carb-Cholecalciferol (CALCIUM 1000 + D PO) Take 1 tablet by mouth daily at 2 PM daily at 2 PM.     . co-enzyme Q-10 30 MG capsule Take 30 mg by mouth daily.     Marland Kitchen levothyroxine (SYNTHROID, LEVOTHROID) 125 MCG tablet Take 1 tablet (125 mcg total) by mouth daily. 90 tablet 3  . Multiple Vitamin (MULTIVITAMIN WITH MINERALS) TABS tablet Take 1 tablet by mouth every morning. Centrum    . Nutritional Supplements (VITAMIN D MAINTENANCE PO) Take by mouth once a week.    Marland Kitchen omeprazole (PRILOSEC) 40 MG capsule Take by mouth.    Marland Kitchen tiZANidine (ZANAFLEX) 4 MG tablet Take 1 tablet (4 mg total) by mouth at bedtime. (Patient taking differently: Take 4 mg by mouth at bedtime as needed. ) 30 tablet 0   No current facility-administered medications for this visit.    Facility-Administered Medications Ordered in Other Visits  Medication Dose Route Frequency Provider Last Rate Last Dose  . tranexamic acid (CYKLOKAPRON) 2,000 mg in sodium chloride 0.9 % 50 mL Topical Application  3,810 mg Topical Once [place], [employer], PA-C        Allergies:   Atorvastatin    Social History:  The patient  reports that she quit smoking about 8 years ago. Her smoking use included cigarettes. She has a 36.00 pack-year smoking history. She has never used smokeless tobacco. She reports that she does not drink alcohol or use drugs.   Family History:  The patient's family history includes Arthritis in her maternal aunt; Diabetes in her maternal aunt; Heart disease in her maternal aunt; Hypertension in her maternal aunt.    ROS: All other systems are reviewed and negative. Unless otherwise mentioned in H&P    PHYSICAL EXAM: VS:  BP 126/78   Pulse 74   Ht '5\' 3"'$  (1.6 m)   Wt 131 lb 6.4 oz (59.6 kg)   BMI 23.28 kg/m  , BMI Body mass index is 23.28  kg/m. GEN: Well nourished, well developed, in no acute distress  HEENT: normal  Neck: no JVD, carotid bruits, or masses Cardiac: RRR; no murmurs, rubs, or gallops,no edema  Respiratory:  Clear to auscultation bilaterally, normal work of breathing GI: soft, nontender, nondistended, + BS MS: no deformity or atrophy  Skin: warm and dry, no rash Neuro:  Strength and sensation are intact Psych: euthymic mood, full affect   EKG: Normal sinus rhythm, 74 bpm, Q waves V1 and V2.  Recent Labs: No results found for requested labs within last 8760 hours.    Lipid Panel    Component Value Date/Time   CHOL 179 10/15/2015 1106   TRIG 105.0 10/15/2015 1106   HDL 72.70 10/15/2015 1106   CHOLHDL 2  10/15/2015 1106   VLDL 21.0 10/15/2015 1106   LDLCALC 85 10/15/2015 1106   LDLDIRECT 126.5 11/10/2012 0826      Wt Readings from Last 3 Encounters:  11/14/17 131 lb 6.4 oz (59.6 kg)  11/08/17 130 lb 12.8 oz (59.3 kg)  08/08/17 135 lb (61.2 kg)    Other studies Reviewed: 04/05/2011 Left ventricle: The cavity size was normal. Wall thickness was normal. Systolic function was normal. The estimated ejection fraction was in the range of 50% to 55%. Wall motion was normal; there were no regional wall motion abnormalities. Doppler parameters are consistent with abnormal left ventricular relaxation (grade 1 diastolic dysfunction). - Atrial septum: There was an atrial septal aneurysm.  NM Stress Test 07/08/2015 Study Highlights    The left ventricular ejection fraction is normal (55-65%).  Nuclear stress EF: 64%.  The study is normal.  This is a low risk study.   1. Low risk study 2. Nl perfusion and EF    ASSESSMENT AND PLAN:  1.  CAD: History of CABG in 2012.  Progressive symptoms of recurrent chest discomfort and dyspnea on exertion along with fatigue.  Plan a Jarratt Myoview for coronary artery disease.  2.  Hypertension: Blood pressure currently well controlled on  medication.  No changes  3. Hypercholesterolemia: Continue low-cholesterol diet.  She is currently not on statin therapy.  Intolerant to statins.  Follow-up lipids and LFTs.  4.  Hypothyroidism: Dose was recently changed from 125 mcg to 150 mcg daily over the last month.  TSH is being ordered.  Current medicines are reviewed at length with the patient today.    Labs/ tests ordered today include: Lexiscan test, we met, CBC, magnesium, TSH, and lipids and LFTs.  Phill Myron. West Pugh, ANP, AACC   11/14/2017 12:22 PM    [facility] [address] [place], [place] [address] Phone: [phone]; Fax: [phone]

## 2017-11-15 ENCOUNTER — Encounter: Payer: Self-pay | Admitting: Gastroenterology

## 2017-11-15 LAB — CBC
Hematocrit: 41.1 % (ref 34.0–46.6)
Hemoglobin: 14 g/dL (ref 11.1–15.9)
MCH: 29.8 pg (ref 26.6–33.0)
MCHC: 34.1 g/dL (ref 31.5–35.7)
MCV: 87 fL (ref 79–97)
Platelets: 272 10*3/uL (ref 150–379)
RBC: 4.7 x10E6/uL (ref 3.77–5.28)
RDW: 14.3 % (ref 12.3–15.4)
WBC: 5.2 10*3/uL (ref 3.4–10.8)

## 2017-11-15 LAB — LIPID PANEL
Chol/HDL Ratio: 3.3 ratio (ref 0.0–4.4)
Cholesterol, Total: 213 mg/dL — ABNORMAL HIGH (ref 100–199)
HDL: 64 mg/dL (ref >39)
LDL Calculated: 124 mg/dL — ABNORMAL HIGH (ref 0–99)
Triglycerides: 127 mg/dL (ref 0–149)
VLDL Cholesterol Cal: 25 mg/dL (ref 5–40)

## 2017-11-15 LAB — BASIC METABOLIC PANEL
BUN/Creatinine Ratio: 17 (ref 12–28)
BUN: 11 mg/dL (ref 8–27)
CO2: 24 mmol/L (ref 20–29)
Calcium: 9.5 mg/dL (ref 8.7–10.3)
Chloride: 104 mmol/L (ref 96–106)
Creatinine, Ser: 0.66 mg/dL (ref 0.57–1.00)
GFR calc Af Amer: 101 mL/min/{1.73_m2} (ref >59)
GFR calc non Af Amer: 88 mL/min/{1.73_m2} (ref >59)
Glucose: 93 mg/dL (ref 65–99)
Potassium: 3.7 mmol/L (ref 3.5–5.2)
Sodium: 141 mmol/L (ref 134–144)

## 2017-11-15 LAB — HEPATIC FUNCTION PANEL
ALT: 10 IU/L (ref 0–32)
AST: 13 IU/L (ref 0–40)
Albumin: 4 g/dL (ref 3.5–4.8)
Alkaline Phosphatase: 106 IU/L (ref 39–117)
Bilirubin Total: 0.5 mg/dL (ref 0.0–1.2)
Bilirubin, Direct: 0.12 mg/dL (ref 0.00–0.40)
Total Protein: 6.8 g/dL (ref 6.0–8.5)

## 2017-11-15 LAB — MAGNESIUM: Magnesium: 2 mg/dL (ref 1.6–2.3)

## 2017-11-15 LAB — TSH: TSH: 0.687 u[IU]/mL (ref 0.450–4.500)

## 2017-11-15 NOTE — Addendum Note (Signed)
Addended by: Leanord Asal T on: 11/15/2017 11:17 AM   Modules accepted: Orders

## 2017-11-22 ENCOUNTER — Telehealth (HOSPITAL_COMMUNITY): Payer: Self-pay

## 2017-11-24 ENCOUNTER — Ambulatory Visit (HOSPITAL_COMMUNITY)
Admission: RE | Admit: 2017-11-24 | Discharge: 2017-11-24 | Disposition: A | Discharge status: Home / Self Care | Payer: Medicare HMO | Source: Ambulatory Visit | Attending: Cardiovascular Disease | Admitting: Cardiovascular Disease

## 2017-11-24 DIAGNOSIS — J449 Chronic obstructive pulmonary disease, unspecified: Secondary | ICD-10-CM | POA: Diagnosis not present

## 2017-11-24 DIAGNOSIS — Z951 Presence of aortocoronary bypass graft: Secondary | ICD-10-CM | POA: Insufficient documentation

## 2017-11-24 DIAGNOSIS — I25119 Atherosclerotic heart disease of native coronary artery with unspecified angina pectoris: Secondary | ICD-10-CM

## 2017-11-24 DIAGNOSIS — I2510 Atherosclerotic heart disease of native coronary artery without angina pectoris: Secondary | ICD-10-CM

## 2017-11-24 DIAGNOSIS — E079 Disorder of thyroid, unspecified: Secondary | ICD-10-CM | POA: Diagnosis not present

## 2017-11-24 DIAGNOSIS — I739 Peripheral vascular disease, unspecified: Secondary | ICD-10-CM | POA: Diagnosis not present

## 2017-11-24 DIAGNOSIS — R011 Cardiac murmur, unspecified: Secondary | ICD-10-CM | POA: Diagnosis not present

## 2017-11-24 DIAGNOSIS — Z87891 Personal history of nicotine dependence: Secondary | ICD-10-CM | POA: Insufficient documentation

## 2017-11-24 DIAGNOSIS — E109 Type 1 diabetes mellitus without complications: Secondary | ICD-10-CM | POA: Insufficient documentation

## 2017-11-24 DIAGNOSIS — Z8249 Family history of ischemic heart disease and other diseases of the circulatory system: Secondary | ICD-10-CM | POA: Insufficient documentation

## 2017-11-24 DIAGNOSIS — R079 Chest pain, unspecified: Secondary | ICD-10-CM | POA: Diagnosis not present

## 2017-11-24 DIAGNOSIS — R5383 Other fatigue: Secondary | ICD-10-CM | POA: Diagnosis not present

## 2017-11-24 LAB — MYOCARDIAL PERFUSION IMAGING
LV dias vol: 59 mL (ref 46–106)
LV sys vol: 19 mL
Peak HR: 106 {beats}/min
Rest HR: 75 {beats}/min
SDS: 1
SRS: 0
SSS: 1
TID: 1.1

## 2017-11-24 MED ORDER — TECHNETIUM TC 99M TETROFOSMIN IV KIT
10.5000 | PACK | Freq: Once | INTRAVENOUS | Status: AC | PRN
  Administered 2017-11-24: 10.5 via INTRAVENOUS
  Filled 2017-11-24: qty 11

## 2017-11-24 MED ORDER — REGADENOSON 0.4 MG/5ML IV SOLN
0.4000 mg | Freq: Once | INTRAVENOUS | Status: AC
  Administered 2017-11-24: 0.4 mg via INTRAVENOUS

## 2017-11-24 MED ORDER — TECHNETIUM TC 99M TETROFOSMIN IV KIT
28.6000 | PACK | Freq: Once | INTRAVENOUS | Status: AC | PRN
  Administered 2017-11-24: 28.6 via INTRAVENOUS
  Filled 2017-11-24: qty 29

## 2017-11-28 ENCOUNTER — Telehealth: Payer: Self-pay | Admitting: Cardiology

## 2017-11-28 NOTE — Telephone Encounter (Signed)
Mrs. Raina Mina is returning a call . Thanks

## 2017-11-28 NOTE — Telephone Encounter (Signed)
Please have her follow up with Dr. Stanford Breed to recommend other options or continued medical therapy. She had normal stress test.  But with age she may not be a candidate for invasive testing.

## 2017-11-28 NOTE — Telephone Encounter (Addendum)
Advised patient of results. She did want to know if she needs to keep her follow up. She is feeling better but continues to have same symptoms. Recommended she keep follow up but she requested I check with Arnold Long DNP to make sure it was necessary. Will forward for review    Notes recorded by Lendon Colonel, NP on 11/28/2017 at 6:56 AM EDT Normal stress test. Good report.

## 2017-11-28 NOTE — Telephone Encounter (Signed)
F/U Call:  Patient returning call

## 2017-11-28 NOTE — Telephone Encounter (Signed)
Left message to call back  

## 2017-12-02 NOTE — Telephone Encounter (Signed)
Left message to call back  

## 2017-12-05 ENCOUNTER — Encounter: Payer: Self-pay | Admitting: Cardiology

## 2017-12-05 NOTE — Telephone Encounter (Signed)
F/U call:  Patient returning call from Friday 12/02/17.

## 2017-12-05 NOTE — Telephone Encounter (Signed)
Left message to call back  

## 2017-12-05 NOTE — Telephone Encounter (Signed)
New message  Pt returning call for nurse

## 2017-12-06 ENCOUNTER — Ambulatory Visit: Payer: Medicare HMO | Admitting: Adult Health

## 2017-12-06 NOTE — Telephone Encounter (Signed)
This encounter was created in error - please disregard.

## 2017-12-06 NOTE — Telephone Encounter (Signed)
Spoke with patient and she is still having some shortness of breath but thinks it is just from where she has not been doing anything. Scheduled her next available with Dr Stanford Breed, not until July. Patient stated she was ok with that and will call back if she has any further issues or changes.

## 2018-01-09 ENCOUNTER — Telehealth (INDEPENDENT_AMBULATORY_CARE_PROVIDER_SITE_OTHER): Payer: Self-pay | Admitting: Orthopedic Surgery

## 2018-01-09 NOTE — Telephone Encounter (Signed)
Patient called saying that she had surgery with Dr. Sharol Given a while back and he put a metal rod in her foot, wanting to know if this will interfere with an MRI. Asking for what kind of metal was used specifically. CB # [phone]

## 2018-01-11 NOTE — Telephone Encounter (Signed)
I called and lm on vm for the pt to advise that going back to 2009 I do not see that she has ssen Dr. Sharol Given in the office. Not in [facility] or epic. I do not see a surgical note of any kind. I do see that she saw Dr. Ninfa Linden in 2011 for her knee and several notes from [facility] about a wrist injury but nothing about her foot or that she was treated by Dr. Sharol Given.

## 2018-01-24 ENCOUNTER — Telehealth: Payer: Self-pay | Admitting: Cardiology

## 2018-01-24 NOTE — Telephone Encounter (Signed)
Spoke with patient and she has been having left sided neck and breast pain. This happens at night while she is in bed. She thinks could be indigestion which she has issues with. Patient had low risk nuclear 11/24/17. She was wanting to be seen tomorrow morning when she comes for her lower arterial at 7:30. Explained Dr Stanford Breed out of the office tomorrow and no available appointments at that time. She stated she would just continue Omeprazole and call back if no better or worse. Will keep follow up as scheduled

## 2018-01-25 ENCOUNTER — Ambulatory Visit (HOSPITAL_COMMUNITY)
Admission: RE | Admit: 2018-01-25 | Discharge: 2018-01-25 | Disposition: A | Discharge status: Home / Self Care | Payer: Medicare HMO | Source: Ambulatory Visit | Attending: Cardiology | Admitting: Cardiology

## 2018-01-25 DIAGNOSIS — I2510 Atherosclerotic heart disease of native coronary artery without angina pectoris: Secondary | ICD-10-CM | POA: Diagnosis not present

## 2018-01-25 DIAGNOSIS — E785 Hyperlipidemia, unspecified: Secondary | ICD-10-CM | POA: Diagnosis not present

## 2018-01-25 DIAGNOSIS — I10 Essential (primary) hypertension: Secondary | ICD-10-CM | POA: Insufficient documentation

## 2018-01-25 DIAGNOSIS — I739 Peripheral vascular disease, unspecified: Secondary | ICD-10-CM | POA: Diagnosis present

## 2018-01-25 DIAGNOSIS — I70203 Unspecified atherosclerosis of native arteries of extremities, bilateral legs: Secondary | ICD-10-CM | POA: Insufficient documentation

## 2018-01-25 DIAGNOSIS — Z87891 Personal history of nicotine dependence: Secondary | ICD-10-CM | POA: Diagnosis not present

## 2018-01-31 ENCOUNTER — Other Ambulatory Visit: Payer: Self-pay | Admitting: *Deleted

## 2018-01-31 ENCOUNTER — Encounter: Payer: Self-pay | Admitting: Cardiovascular Disease

## 2018-01-31 ENCOUNTER — Ambulatory Visit (INDEPENDENT_AMBULATORY_CARE_PROVIDER_SITE_OTHER): Payer: Medicare HMO | Admitting: Cardiovascular Disease

## 2018-01-31 ENCOUNTER — Telehealth: Payer: Self-pay | Admitting: Cardiovascular Disease

## 2018-01-31 ENCOUNTER — Encounter: Payer: Self-pay | Admitting: *Deleted

## 2018-01-31 VITALS — BP 126/68 | HR 74 | Ht 63.0 in | Wt 134.0 lb

## 2018-01-31 DIAGNOSIS — Z01818 Encounter for other preprocedural examination: Secondary | ICD-10-CM | POA: Diagnosis not present

## 2018-01-31 DIAGNOSIS — I739 Peripheral vascular disease, unspecified: Secondary | ICD-10-CM

## 2018-01-31 LAB — BASIC METABOLIC PANEL
BUN/Creatinine Ratio: 14 (ref 12–28)
BUN: 9 mg/dL (ref 8–27)
CO2: 21 mmol/L (ref 20–29)
Calcium: 9.7 mg/dL (ref 8.7–10.3)
Chloride: 102 mmol/L (ref 96–106)
Creatinine, Ser: 0.66 mg/dL (ref 0.57–1.00)
GFR calc Af Amer: 101 mL/min/{1.73_m2} (ref >59)
GFR calc non Af Amer: 88 mL/min/{1.73_m2} (ref >59)
Glucose: 97 mg/dL (ref 65–99)
Potassium: 3.8 mmol/L (ref 3.5–5.2)
Sodium: 141 mmol/L (ref 134–144)

## 2018-01-31 LAB — CBC
Hematocrit: 44.1 % (ref 34.0–46.6)
Hemoglobin: 15.5 g/dL (ref 11.1–15.9)
MCH: 29.9 pg (ref 26.6–33.0)
MCHC: 35.1 g/dL (ref 31.5–35.7)
MCV: 85 fL (ref 79–97)
Platelets: 287 10*3/uL (ref 150–450)
RBC: 5.18 x10E6/uL (ref 3.77–5.28)
RDW: 14.5 % (ref 12.3–15.4)
WBC: 5 10*3/uL (ref 3.4–10.8)

## 2018-01-31 LAB — PROTIME-INR
INR: 0.9 (ref 0.8–1.2)
Prothrombin Time: 9.8 s (ref 9.1–12.0)

## 2018-01-31 NOTE — Telephone Encounter (Signed)
New Message:         Pt says she wants to change the date of her procedure to the 17th please.  [name]

## 2018-01-31 NOTE — Telephone Encounter (Signed)
Spoke with pt, aware procedure changed to Monday 02-06-18.

## 2018-01-31 NOTE — Progress Notes (Signed)
This encounter was created in error - please disregard.

## 2018-01-31 NOTE — Progress Notes (Signed)
01/31/2018 MCKINNA [name]   [birth year]  [id number]  Primary Physician Reynold Bowen, MD Primary Cardiologist: Lorretta Harp MD [name], [place], [place]  HPI:  [name] is a 72 y.o.  African American female patient of Dr. Jacalyn Lefevre referred for peripheral vascular evaluation. She was seen by Dr. Gladstone Lighter orthopedic surgeon, who referred her here. I last saw her in the office 07/06/2016.[name] Her past history is remarkable for ischemic heart disease status post coronary bypass grafting in March 2012 of the LIMA to LAD, vein to the diagonal branch, obtuse marginal branch and a vein to acute marginal branch. She has normal LV function by 2-D echo. Further problems include hypertension, and hyperlipidemia. She complains of right lower extremity claudication and had seen Dr. Fletcher Anon in the past who thought that her pain was arthritic in nature. Lower extremity arterial Doppler studies performed 07/07/14 revealed a right ABI 0.43 with an occluded distal right SFA and popliteal artery, left ABI 0.73 with a high-frequency signal in the mid left SFA. I performed angiography on her 753/15 revealing an occluded left SFA with one vessel runoff via the peroneal. I performed TurboHawk directional atherectomy followed by PTA using drug-eluting balloon with excellent angiographic result. Her followup Doppler revealed an increase in her right ABI from 0.4 to .7 with resolution of her claudication symptoms. Since I saw her over 2 years ago she developed claudication in her left leg now. I suspected that her previously  documented lesions in her left external leg artery and SFA have progressed. She underwent angiography by myself 06/07/16 revealing 80% segmental mid left SFA stenosis. I performed [facility] 1 directional atherectomy followed by drug-eluting balloon into plasty. She will vessel runoff via peroneal. She did have 50% distal left common iliac artery stenosis without a gradient. Her follow-up lower extremity  Doppler studies performed 06/24/16 were markedly improved ABI 1.04. Since I saw her back here in a half ago she is developed right calf claudication is lifestyle limiting.  Recent Dopplers performed 18 revealed a right ABI 0.46 and a left ABI of 0.95.  Her distal right SFA and popliteal artery appear to be occluded.    No outpatient medications have been marked as taking for the 01/31/18 encounter (Office Visit) with Lorretta Harp, MD.     Allergies  Allergen Reactions  . Atorvastatin Other (See Comments)    Muscle aches Other reaction(s): Other (See Comments) Muscle aches    Social History   Socioeconomic History  . Marital status: Single    Spouse name: Not on file  . Number of children: 2  . Years of education: 37  . Highest education level: Not on file  Occupational History  . Occupation: Retired    Fish farm manager: RETIRED  Social Needs  . Financial resource strain: Not on file  . Food insecurity:    Worry: Not on file    Inability: Not on file  . Transportation needs:    Medical: Not on file    Non-medical: Not on file  Tobacco Use  . Smoking status: Former Smoker    Packs/day: 0.80    Years: 45.00    Pack years: 36.00    Types: Cigarettes    Last attempt to quit: 08/23/2009    Years since quitting: 8.4  . Smokeless tobacco: Never Used  Substance and Sexual Activity  . Alcohol use: No    Alcohol/week: 0.0 oz    Comment: WINE OCC.  . Drug use: No  .  Sexual activity: Not on file  Lifestyle  . Physical activity:    Days per week: Not on file    Minutes per session: Not on file  . Stress: Not on file  Relationships  . Social connections:    Talks on phone: Not on file    Gets together: Not on file    Attends religious service: Not on file    Active member of club or organization: Not on file    Attends meetings of clubs or organizations: Not on file    Relationship status: Not on file  . Intimate partner violence:    Fear of current or ex partner: Not on  file    Emotionally abused: Not on file    Physically abused: Not on file    Forced sexual activity: Not on file  Other Topics Concern  . Not on file  Social History Narrative  . Not on file     Review of Systems: General: negative for chills, fever, night sweats or weight changes.  Cardiovascular: negative for chest pain, dyspnea on exertion, edema, orthopnea, palpitations, paroxysmal nocturnal dyspnea or shortness of breath Dermatological: negative for rash Respiratory: negative for cough or wheezing Urologic: negative for hematuria Abdominal: negative for nausea, vomiting, diarrhea, bright red blood per rectum, melena, or hematemesis Neurologic: negative for visual changes, syncope, or dizziness All other systems reviewed and are otherwise negative except as noted above.    Blood pressure 126/68, pulse 74, height 5\' 3"  (1.6 m), weight 134 lb (60.8 kg).  General appearance: alert and no distress Neck: no adenopathy, no carotid bruit, no JVD, supple, symmetrical, trachea midline and thyroid not enlarged, symmetric, no tenderness/mass/nodules Lungs: clear to auscultation bilaterally Heart: regular rate and rhythm, S1, S2 normal, no murmur, click, rub or gallop Extremities: extremities normal, atraumatic, no cyanosis or edema Pulses: 2+ and symmetric Skin: Skin color, texture, turgor normal. No rashes or lesions Neurologic: Alert and oriented X 3, normal strength and tone. Normal symmetric reflexes. Normal coordination and gait  EKG not performed today  ASSESSMENT AND PLAN:   PAD (peripheral artery disease) ([place]) 2015 with staged left SFA intervention 10/17 with marked improvement in her claudication bilaterally.  Does have one-vessel runoff on the right via a diseased peroneal vessel runoff on the left.  He now complains of increasing right lower extremity claudication with Dopplers 01/25/2018 revealing a right ABI 0.46 and a left of 1.95.  The right SFA and popliteal appear to be  occluded.  She wishes to have these percutaneously addressed for lifestyle limiting claudication.      Lorretta Harp MD FACP,FACC,FAHA, [facility] 01/31/2018 10:19 AM

## 2018-01-31 NOTE — Patient Instructions (Signed)
   [facility] [address] [place] [address] Dept: [phone] Loc: [place]  01/31/2018  You are scheduled for a Peripheral Angiogram on Monday, July 8 with Dr. Quay Burow.  1. Please arrive at the [facility] (Main Entrance A) at [facility]: [address] [place], [place] [address] at 5:30 AM (two hours before your procedure to ensure your preparation). Free valet parking service is available.   Special note: Every effort is made to have your procedure done on time. Please understand that emergencies sometimes delay scheduled procedures.  2. Diet: Do not eat or drink anything after midnight prior to your procedure except sips of water to take medications.  3. Labs: Your physician recommends that you HAVE LAB WORK TODAY  4. Medication instructions in preparation for your procedure  On the morning of your procedure, take your Aspirin and any morning medicines NOT listed above.  You may use sips of water.  5. Plan for one night stay--bring personal belongings. 6. Bring a current list of your medications and current insurance cards. 7. You MUST have a responsible person to drive you home. 8. Someone MUST be with you the first 24 hours after you arrive home or your discharge will be delayed. 9. Please wear clothes that are easy to get on and off and wear slip-on shoes.  Thank you for allowing [place] to care for you!   -- [facility]

## 2018-01-31 NOTE — H&P (View-Only) (Signed)
01/31/2018 ALIX LAHMANN   March 02, 1946  [id number]  Primary Physician Reynold Bowen, MD Primary Cardiologist: Lorretta Harp MD Garret Reddish, [place], [place]  HPI:  [name] is a 72 y.o.  African American female patient of Dr. Jacalyn Lefevre referred for peripheral vascular evaluation. She was seen by Dr. Gladstone Lighter orthopedic surgeon, who referred her here. I last saw her in the office 07/06/2016.Marland Kitchen Her past history is remarkable for ischemic heart disease status post coronary bypass grafting in March 2012 of the LIMA to LAD, vein to the diagonal branch, obtuse marginal branch and a vein to acute marginal branch. She has normal LV function by 2-D echo. Further problems include hypertension, and hyperlipidemia. She complains of right lower extremity claudication and had seen Dr. Fletcher Anon in the past who thought that her pain was arthritic in nature. Lower extremity arterial Doppler studies performed 07/07/14 revealed a right ABI 0.43 with an occluded distal right SFA and popliteal artery, left ABI 0.73 with a high-frequency signal in the mid left SFA. I performed angiography on her 753/15 revealing an occluded left SFA with one vessel runoff via the peroneal. I performed TurboHawk directional atherectomy followed by PTA using drug-eluting balloon with excellent angiographic result. Her followup Doppler revealed an increase in her right ABI from 0.4 to .7 with resolution of her claudication symptoms. Since I saw her over 2 years ago she developed claudication in her left leg now. I suspected that her previously  documented lesions in her left external leg artery and SFA have progressed. She underwent angiography by myself 06/07/16 revealing 80% segmental mid left SFA stenosis. I performed [facility] 1 directional atherectomy followed by drug-eluting balloon into plasty. She will vessel runoff via peroneal. She did have 50% distal left common iliac artery stenosis without a gradient. Her follow-up lower extremity  Doppler studies performed 06/24/16 were markedly improved ABI 1.04. Since I saw her back here in a half ago she is developed right calf claudication is lifestyle limiting.  Recent Dopplers performed 18 revealed a right ABI 0.46 and a left ABI of 0.95.  Her distal right SFA and popliteal artery appear to be occluded.    No outpatient medications have been marked as taking for the 01/31/18 encounter (Office Visit) with Lorretta Harp, MD.     Allergies  Allergen Reactions  . Atorvastatin Other (See Comments)    Muscle aches Other reaction(s): Other (See Comments) Muscle aches    Social History   Socioeconomic History  . Marital status: Single    Spouse name: Not on file  . Number of children: 2  . Years of education: 26  . Highest education level: Not on file  Occupational History  . Occupation: Retired    Fish farm manager: RETIRED  Social Needs  . Financial resource strain: Not on file  . Food insecurity:    Worry: Not on file    Inability: Not on file  . Transportation needs:    Medical: Not on file    Non-medical: Not on file  Tobacco Use  . Smoking status: Former Smoker    Packs/day: 0.80    Years: 45.00    Pack years: 36.00    Types: Cigarettes    Last attempt to quit: 08/23/2009    Years since quitting: 8.4  . Smokeless tobacco: Never Used  Substance and Sexual Activity  . Alcohol use: No    Alcohol/week: 0.0 oz    Comment: WINE OCC.  . Drug use: No  .  Sexual activity: Not on file  Lifestyle  . Physical activity:    Days per week: Not on file    Minutes per session: Not on file  . Stress: Not on file  Relationships  . Social connections:    Talks on phone: Not on file    Gets together: Not on file    Attends religious service: Not on file    Active member of club or organization: Not on file    Attends meetings of clubs or organizations: Not on file    Relationship status: Not on file  . Intimate partner violence:    Fear of current or ex partner: Not on  file    Emotionally abused: Not on file    Physically abused: Not on file    Forced sexual activity: Not on file  Other Topics Concern  . Not on file  Social History Narrative  . Not on file     Review of Systems: General: negative for chills, fever, night sweats or weight changes.  Cardiovascular: negative for chest pain, dyspnea on exertion, edema, orthopnea, palpitations, paroxysmal nocturnal dyspnea or shortness of breath Dermatological: negative for rash Respiratory: negative for cough or wheezing Urologic: negative for hematuria Abdominal: negative for nausea, vomiting, diarrhea, bright red blood per rectum, melena, or hematemesis Neurologic: negative for visual changes, syncope, or dizziness All other systems reviewed and are otherwise negative except as noted above.    Blood pressure 126/68, pulse 74, height 5\' 3"  (1.6 m), weight 134 lb (60.8 kg).  General appearance: alert and no distress Neck: no adenopathy, no carotid bruit, no JVD, supple, symmetrical, trachea midline and thyroid not enlarged, symmetric, no tenderness/mass/nodules Lungs: clear to auscultation bilaterally Heart: regular rate and rhythm, S1, S2 normal, no murmur, click, rub or gallop Extremities: extremities normal, atraumatic, no cyanosis or edema Pulses: 2+ and symmetric Skin: Skin color, texture, turgor normal. No rashes or lesions Neurologic: Alert and oriented X 3, normal strength and tone. Normal symmetric reflexes. Normal coordination and gait  EKG not performed today  ASSESSMENT AND PLAN:   PAD (peripheral artery disease) ([place]) 2015 with staged left SFA intervention 10/17 with marked improvement in her claudication bilaterally.  Does have one-vessel runoff on the right via a diseased peroneal vessel runoff on the left.  He now complains of increasing right lower extremity claudication with Dopplers 01/25/2018 revealing a right ABI 0.46 and a left of 1.95.  The right SFA and popliteal appear to be  occluded.  She wishes to have these percutaneously addressed for lifestyle limiting claudication.      Lorretta Harp MD FACP,FACC,FAHA, [facility] 01/31/2018 10:19 AM

## 2018-01-31 NOTE — Assessment & Plan Note (Signed)
2015 with staged left SFA intervention 10/17 with marked improvement in her claudication bilaterally.  Does have one-vessel runoff on the right via a diseased peroneal vessel runoff on the left.  He now complains of increasing right lower extremity claudication with Dopplers 01/25/2018 revealing a right ABI 0.46 and a left of 1.95.  The right SFA and popliteal appear to be occluded.  She wishes to have these percutaneously addressed for lifestyle limiting claudication.

## 2018-02-03 ENCOUNTER — Other Ambulatory Visit: Payer: Self-pay | Admitting: Cardiovascular Disease

## 2018-02-06 ENCOUNTER — Other Ambulatory Visit: Payer: Self-pay

## 2018-02-06 ENCOUNTER — Ambulatory Visit (HOSPITAL_COMMUNITY)
Admission: RE | Disposition: A | Discharge status: Home / Self Care | Payer: Self-pay | Source: Ambulatory Visit | Attending: Cardiovascular Disease

## 2018-02-06 ENCOUNTER — Ambulatory Visit (HOSPITAL_COMMUNITY)
Admission: RE | Admit: 2018-02-06 | Discharge: 2018-02-07 | Disposition: A | Discharge status: Home / Self Care | Payer: Medicare HMO | Source: Ambulatory Visit | Attending: Cardiovascular Disease | Admitting: Cardiovascular Disease

## 2018-02-06 ENCOUNTER — Encounter (HOSPITAL_COMMUNITY): Payer: Self-pay | Admitting: Cardiovascular Disease

## 2018-02-06 DIAGNOSIS — I70213 Atherosclerosis of native arteries of extremities with intermittent claudication, bilateral legs: Secondary | ICD-10-CM | POA: Insufficient documentation

## 2018-02-06 DIAGNOSIS — I2510 Atherosclerotic heart disease of native coronary artery without angina pectoris: Secondary | ICD-10-CM | POA: Insufficient documentation

## 2018-02-06 DIAGNOSIS — E785 Hyperlipidemia, unspecified: Secondary | ICD-10-CM | POA: Diagnosis not present

## 2018-02-06 DIAGNOSIS — I70211 Atherosclerosis of native arteries of extremities with intermittent claudication, right leg: Secondary | ICD-10-CM | POA: Diagnosis not present

## 2018-02-06 DIAGNOSIS — I739 Peripheral vascular disease, unspecified: Secondary | ICD-10-CM | POA: Diagnosis present

## 2018-02-06 DIAGNOSIS — Z951 Presence of aortocoronary bypass graft: Secondary | ICD-10-CM | POA: Insufficient documentation

## 2018-02-06 DIAGNOSIS — I10 Essential (primary) hypertension: Secondary | ICD-10-CM | POA: Diagnosis not present

## 2018-02-06 DIAGNOSIS — Z87891 Personal history of nicotine dependence: Secondary | ICD-10-CM | POA: Insufficient documentation

## 2018-02-06 DIAGNOSIS — I7092 Chronic total occlusion of artery of the extremities: Secondary | ICD-10-CM | POA: Insufficient documentation

## 2018-02-06 HISTORY — DX: Headache, unspecified: R51.9

## 2018-02-06 HISTORY — DX: Headache: R51

## 2018-02-06 HISTORY — PX: LOWER EXTREMITY ANGIOGRAPHY: CATH118251

## 2018-02-06 HISTORY — PX: PERIPHERAL VASCULAR ATHERECTOMY: CATH118256

## 2018-02-06 LAB — GLUCOSE, CAPILLARY
Glucose-Capillary: 119 mg/dL — ABNORMAL HIGH (ref 65–99)
Glucose-Capillary: 109 mg/dL — ABNORMAL HIGH (ref 65–99)
Glucose-Capillary: 113 mg/dL — ABNORMAL HIGH (ref 65–99)
Glucose-Capillary: 133 mg/dL — ABNORMAL HIGH (ref 65–99)

## 2018-02-06 LAB — POCT ACTIVATED CLOTTING TIME
Activated Clotting Time: 263 seconds
Activated Clotting Time: 268 seconds
Activated Clotting Time: 164 seconds

## 2018-02-06 SURGERY — LOWER EXTREMITY ANGIOGRAPHY
Anesthesia: LOCAL

## 2018-02-06 MED ORDER — HEPARIN (PORCINE) IN NACL 2-0.9 UNITS/ML
INTRAMUSCULAR | Status: AC | PRN
  Administered 2018-02-06 (×2): 500 mL

## 2018-02-06 MED ORDER — SODIUM CHLORIDE 0.9 % WEIGHT BASED INFUSION
3.0000 mL/kg/h | INTRAVENOUS | Status: DC
  Administered 2018-02-06: 3 mL/kg/h via INTRAVENOUS

## 2018-02-06 MED ORDER — NITROGLYCERIN 1 MG/10 ML FOR IR/CATH LAB
INTRA_ARTERIAL | Status: AC
  Filled 2018-02-06: qty 10

## 2018-02-06 MED ORDER — HEPARIN (PORCINE) IN NACL 1000-0.9 UT/500ML-% IV SOLN
INTRAVENOUS | Status: AC
  Filled 2018-02-06: qty 1000

## 2018-02-06 MED ORDER — SODIUM CHLORIDE 0.9 % WEIGHT BASED INFUSION: 1.0000 mL/kg/h | INTRAVENOUS | Status: DC

## 2018-02-06 MED ORDER — HYDRALAZINE HCL 20 MG/ML IJ SOLN
5.0000 mg | INTRAMUSCULAR | Status: DC | PRN
  Administered 2018-02-06: 12:00:00 5 mg via INTRAVENOUS
  Filled 2018-02-06: qty 1

## 2018-02-06 MED ORDER — SODIUM CHLORIDE 0.9% FLUSH
3.0000 mL | Freq: Two times a day (BID) | INTRAVENOUS | Status: DC
  Administered 2018-02-06: 22:00:00 3 mL via INTRAVENOUS

## 2018-02-06 MED ORDER — CLOPIDOGREL BISULFATE 300 MG PO TABS
ORAL_TABLET | ORAL | Status: AC
  Filled 2018-02-06: qty 1

## 2018-02-06 MED ORDER — SODIUM CHLORIDE 0.9% FLUSH: 3.0000 mL | INTRAVENOUS | Status: DC | PRN

## 2018-02-06 MED ORDER — PANTOPRAZOLE SODIUM 40 MG PO TBEC
40.0000 mg | DELAYED_RELEASE_TABLET | Freq: Every day | ORAL | Status: DC
Start: 2018-02-06 — End: 2018-02-07
  Administered 2018-02-06 – 2018-02-07 (×2): 40 mg via ORAL
  Filled 2018-02-06 (×2): qty 1

## 2018-02-06 MED ORDER — ASPIRIN 81 MG PO CHEW: 81.0000 mg | CHEWABLE_TABLET | ORAL | Status: DC

## 2018-02-06 MED ORDER — SODIUM CHLORIDE 0.9 % IV SOLN: 250.0000 mL | INTRAVENOUS | Status: DC | PRN

## 2018-02-06 MED ORDER — SODIUM CHLORIDE 0.9 % IV SOLN
INTRAVENOUS | Status: DC
  Administered 2018-02-06: 13:00:00 250 mL via INTRAVENOUS

## 2018-02-06 MED ORDER — AMLODIPINE BESYLATE 10 MG PO TABS
10.0000 mg | ORAL_TABLET | Freq: Every day | ORAL | Status: DC
  Administered 2018-02-07: 11:00:00 10 mg via ORAL
  Filled 2018-02-06: qty 1

## 2018-02-06 MED ORDER — SODIUM CHLORIDE 0.9% FLUSH: 3.0000 mL | Freq: Two times a day (BID) | INTRAVENOUS | Status: DC

## 2018-02-06 MED ORDER — LIDOCAINE HCL (PF) 1 % IJ SOLN
INTRAMUSCULAR | Status: DC | PRN
  Administered 2018-02-06: 30 mL via SUBCUTANEOUS

## 2018-02-06 MED ORDER — LABETALOL HCL 5 MG/ML IV SOLN: 10.0000 mg | INTRAVENOUS | Status: DC | PRN

## 2018-02-06 MED ORDER — HEPARIN SODIUM (PORCINE) 1000 UNIT/ML IJ SOLN
INTRAMUSCULAR | Status: DC | PRN
  Administered 2018-02-06: 7500 [IU] via INTRAVENOUS
  Administered 2018-02-06: 2000 [IU] via INTRAVENOUS

## 2018-02-06 MED ORDER — CLOPIDOGREL BISULFATE 300 MG PO TABS
ORAL_TABLET | ORAL | Status: DC | PRN
  Administered 2018-02-06: 300 mg via ORAL

## 2018-02-06 MED ORDER — CLOPIDOGREL BISULFATE 75 MG PO TABS
75.0000 mg | ORAL_TABLET | Freq: Every day | ORAL | Status: DC
  Administered 2018-02-07: 08:00:00 75 mg via ORAL
  Filled 2018-02-06: qty 1

## 2018-02-06 MED ORDER — IODIXANOL 320 MG/ML IV SOLN
INTRAVENOUS | Status: DC | PRN
  Administered 2018-02-06: 180 mL via INTRA_ARTERIAL

## 2018-02-06 MED ORDER — ASPIRIN EC 81 MG PO TBEC: 81.0000 mg | DELAYED_RELEASE_TABLET | Freq: Every day | ORAL | Status: DC

## 2018-02-06 MED ORDER — SODIUM CHLORIDE 0.9 % IV SOLN
INTRAVENOUS | Status: AC
  Administered 2018-02-06 (×2): via INTRAVENOUS

## 2018-02-06 MED ORDER — ONDANSETRON HCL 4 MG/2ML IJ SOLN
4.0000 mg | Freq: Four times a day (QID) | INTRAMUSCULAR | Status: DC | PRN
  Administered 2018-02-06: 13:00:00 4 mg via INTRAVENOUS
  Filled 2018-02-06: qty 2

## 2018-02-06 MED ORDER — LIDOCAINE HCL (PF) 1 % IJ SOLN
INTRAMUSCULAR | Status: AC
  Filled 2018-02-06: qty 30

## 2018-02-06 MED ORDER — NITROGLYCERIN 1 MG/10 ML FOR IR/CATH LAB
INTRA_ARTERIAL | Status: DC | PRN
  Administered 2018-02-06: 200 ug via INTRA_ARTERIAL

## 2018-02-06 MED ORDER — ASPIRIN EC 81 MG PO TBEC
81.0000 mg | DELAYED_RELEASE_TABLET | Freq: Every day | ORAL | Status: DC
  Administered 2018-02-07: 81 mg via ORAL
  Filled 2018-02-06: qty 1

## 2018-02-06 MED ORDER — ACETAMINOPHEN 325 MG PO TABS
650.0000 mg | ORAL_TABLET | ORAL | Status: DC | PRN
  Administered 2018-02-06: 16:00:00 650 mg via ORAL
  Filled 2018-02-06: qty 2

## 2018-02-06 MED ORDER — MORPHINE SULFATE (PF) 2 MG/ML IV SOLN: 2.0000 mg | INTRAVENOUS | Status: DC | PRN

## 2018-02-06 MED ORDER — LEVOTHYROXINE SODIUM 75 MCG PO TABS
150.0000 ug | ORAL_TABLET | Freq: Every day | ORAL | Status: DC
  Administered 2018-02-07: 06:00:00 150 ug via ORAL
  Filled 2018-02-06: qty 2

## 2018-02-06 MED ORDER — ANGIOPLASTY BOOK
Freq: Once | Status: AC
  Administered 2018-02-06: 22:00:00 1
  Filled 2018-02-06: qty 1

## 2018-02-06 SURGICAL SUPPLY — 35 items
BALLN CHOCOLATE 5.0X80X120 (BALLOONS) ×3
BALLN COYOTE ES OTW 2.5X40X145 (BALLOONS) ×3
BALLN IN.PACT DCB 5X40 (BALLOONS) ×3
BALLOON CHOCOLATE 5.0X80X120 (BALLOONS) ×1 IMPLANT
BALLOON CYTE ES OTW 2.5X40X145 (BALLOONS) ×1 IMPLANT
CATH ANGIO 5F PIGTAIL 65CM (CATHETERS) ×2 IMPLANT
CATH HAWKONE LS STANDARD TIP (CATHETERS) ×3
CATH HAWKONE LS STD TIP (CATHETERS) IMPLANT
CATH SOFT-VU 4F 65 STRAIGHT (CATHETERS) IMPLANT
CATH SOFT-VU STRAIGHT 4F 65CM (CATHETERS) ×3
CATH STRAIGHT 5FR 65CM (CATHETERS) ×1 IMPLANT
CATH TEMPO 5F RIM 65CM (CATHETERS) ×1 IMPLANT
CATH VIANCE CROSS STAND 150CM (MICROCATHETER) ×3
CATH VIANCE CROSS STD 150CM (MICROCATHETER) IMPLANT
COVER DOME SNAP 22 D (MISCELLANEOUS) ×1 IMPLANT
DCB IN.PACT 5X40 (BALLOONS) ×1 IMPLANT
DEVICE SPIDERFX EMB PROT 6MM (WIRE) ×2 IMPLANT
DEVICE TORQUE .014-.018 (MISCELLANEOUS) IMPLANT
GUIDEWIRE ANGLED .035X150CM (WIRE) ×2 IMPLANT
KIT ENCORE 26 ADVANTAGE (KITS) ×3 IMPLANT
KIT PV (KITS) ×3 IMPLANT
SHEATH HIGHFLEX ANSEL 7FR 55CM (SHEATH) ×1 IMPLANT
SHEATH PINNACLE 5F 10CM (SHEATH) ×1 IMPLANT
SHEATH PINNACLE 7F 10CM (SHEATH) ×3 IMPLANT
STOPCOCK MORSE 400PSI 3WAY (MISCELLANEOUS) ×2 IMPLANT
SYRINGE MEDRAD AVANTA MACH 7 (SYRINGE) ×1 IMPLANT
TAPE VIPERTRACK RADIOPAQ (MISCELLANEOUS) IMPLANT
TAPE VIPERTRACK RADIOPAQUE (MISCELLANEOUS) ×6
TORQUE DEVICE .014-.018 (MISCELLANEOUS) ×3
TRANSDUCER W/STOPCOCK (MISCELLANEOUS) ×3 IMPLANT
TRAY PV CATH (CUSTOM PROCEDURE TRAY) ×3 IMPLANT
TUBING CIL FLEX 10 FLL-RA (TUBING) ×1 IMPLANT
WIRE HITORQ VERSACORE ST 145CM (WIRE) ×1 IMPLANT
WIRE ROSEN-J .035X260CM (WIRE) ×1 IMPLANT
WIRE SPARTACORE .014X300CM (WIRE) ×2 IMPLANT

## 2018-02-06 NOTE — Progress Notes (Signed)
Site area: left groin  Site Prior to Removal:  Level 1  Pressure Applied For 25 MINUTES    Minutes Beginning at 1245  Manual:   Yes.    Patient Status During Pull:  Vagal response.  Post Pull Groin Site:  Level 0  Post Pull Instructions Given:  Yes.    Post Pull Pulses Present:  Yes.    Dressing Applied:  Yes.    Comments:  Patient experienced a vagal response 12 minutes into the hold, with drop in BP and nausea.  IVF bolus of NS given, Zofran given, patient returned to baseline within 10 minutes.

## 2018-02-06 NOTE — Interval H&P Note (Signed)
History and Physical Interval Note:  02/06/2018 7:45 AM  [name]  has presented today for surgery, with the diagnosis of pad  The various methods of treatment have been discussed with the patient and family. After consideration of risks, benefits and other options for treatment, the patient has consented to  Procedure(s): LOWER EXTREMITY ANGIOGRAPHY (N/A) as a surgical intervention .  The patient's history has been reviewed, patient examined, no change in status, stable for surgery.  I have reviewed the patient's chart and labs.  Questions were answered to the patient's satisfaction.     Quay Burow

## 2018-02-07 ENCOUNTER — Other Ambulatory Visit: Payer: Self-pay | Admitting: Physician Assistant

## 2018-02-07 DIAGNOSIS — I739 Peripheral vascular disease, unspecified: Secondary | ICD-10-CM

## 2018-02-07 DIAGNOSIS — I70213 Atherosclerosis of native arteries of extremities with intermittent claudication, bilateral legs: Secondary | ICD-10-CM | POA: Diagnosis not present

## 2018-02-07 LAB — BASIC METABOLIC PANEL
Anion gap: 8 (ref 5–15)
BUN: 7 mg/dL (ref 6–20)
CO2: 25 mmol/L (ref 22–32)
Calcium: 8.8 mg/dL — ABNORMAL LOW (ref 8.9–10.3)
Chloride: 110 mmol/L (ref 101–111)
Creatinine, Ser: 0.71 mg/dL (ref 0.44–1.00)
GFR calc Af Amer: >60 mL/min (ref >60)
GFR calc non Af Amer: >60 mL/min (ref >60)
Glucose, Bld: 126 mg/dL — ABNORMAL HIGH (ref 65–99)
Potassium: 3.7 mmol/L (ref 3.5–5.1)
Sodium: 143 mmol/L (ref 135–145)

## 2018-02-07 LAB — CBC
HCT: 41.4 % (ref 36.0–46.0)
Hemoglobin: 13.2 g/dL (ref 12.0–15.0)
MCH: 28.1 pg (ref 26.0–34.0)
MCHC: 31.9 g/dL (ref 30.0–36.0)
MCV: 88.3 fL (ref 78.0–100.0)
Platelets: 233 10*3/uL (ref 150–400)
RBC: 4.69 MIL/uL (ref 3.87–5.11)
RDW: 14.2 % (ref 11.5–15.5)
WBC: 4.6 10*3/uL (ref 4.0–10.5)

## 2018-02-07 LAB — GLUCOSE, CAPILLARY: Glucose-Capillary: 116 mg/dL — ABNORMAL HIGH (ref 65–99)

## 2018-02-07 MED ORDER — ROSUVASTATIN CALCIUM 10 MG PO TABS: 10.0000 mg | ORAL_TABLET | Freq: Every day | ORAL | 6 refills | Status: DC

## 2018-02-07 MED ORDER — PANTOPRAZOLE SODIUM 40 MG PO TBEC: 40.0000 mg | DELAYED_RELEASE_TABLET | Freq: Every day | ORAL | 6 refills | Status: DC

## 2018-02-07 MED ORDER — CLOPIDOGREL BISULFATE 75 MG PO TABS: 75.0000 mg | ORAL_TABLET | Freq: Every day | ORAL | 3 refills | Status: DC

## 2018-02-07 MED FILL — Heparin Sod (Porcine)-NaCl IV Soln 1000 Unit/500ML-0.9%: INTRAVENOUS | Qty: 1000 | Status: AC

## 2018-02-07 NOTE — Discharge Summary (Addendum)
Discharge Summary    Patient ID: [name],  MRN: [id number], DOB/AGE: [birth year] 72 y.o.  Admit date: 02/06/2018 Discharge date: 02/07/2018  Primary Care Provider: Reynold Bowen Cardiologist: Dr. Stanford Breed PAD Cardiologist:: Dr. Gwenlyn Found    Discharge Diagnoses    Active Problems:   Claudication [facility])   Claudication in peripheral vascular disease ([place])    CAD  HLD  HTN  Allergies Allergies  Allergen Reactions  . Atorvastatin Other (See Comments)    Muscle aches Other reaction(s): Other (See Comments) Muscle aches    Diagnostic Studies/Procedures    PV Angiogram/Intervention   Procedures Performed:               1.  Abdominal aortogram/bilateral iliac angiogram/bifemoral runoff               2.  Contralateral access (second order catheter placement)               3.  Crossing distal right SFA CTO with Viance catheter               4.  Placement of spider distal protection device in right above-the-knee popliteal artery               5.  Hawk 1 directional atherectomy distal right SFA               6.  Drug-eluting balloon angioplasty distal right SFA               7.  Chocolate balloon angioplasty distal right common femoral/proximal right SFA  Angiographic Data:   1: Distal abdominal aorta- widely patent 2: Left lower extremity- there was a 50% ostial and 60% distal left common iliac artery stenosis without a pullback gradient.  There were multiple lumpy bumpy areas of 30 to 40% throughout the left SFA with a fairly focal 70% proximal and mid stenosis.  There was one vessel runoff via peroneal.  The previously atherectomized and drug-eluting eluting balloon angioplastied site in the mid to distal left SFA was widely patent. 3: Right lower extremity- there were multiple 30 to 40% "lumpy bumpy lesions in the proximal and mid right SFA with a short segment CTO in the distal right SFA.  There is one-vessel runoff via the peroneal artery.  There was a high-grade  lesion in the distal right common femoral/ostial right SFA which was not initially appreciated in the AP view but better in the steep oblique.  IMPRESSION: [name] previously atherectomized and drug-eluting balloon angioplastied left SFA was widely patent.  She does have a short segment CTO of the distal right SFA with high-grade disease in the origin of that vessel as well as the distal right common femoral.  We will proceed with [facility] 1 directional atherectomy of the distal right SFA using distal protection followed by chocolate balloon angioplasty of the distal right common femoral/ostial right SFA.  Procedure Description: Contralateral access was obtained with a RIM catheter, Glidewire, Rosen wire and a 7 French 55 cm multipurpose Ansell sheath.  The patient received 9500 units of heparin with an ACT of 268.  Total contrast administered the patient with 180 cc.  I was able to cross the CTO with a Viance catheter and a [place] core wire.  Following this I performed balloon angioplasty with a 2.5 mm x 4 cm long balloon and then placed a 6 mm spider distal protection device in the right above-the-knee popliteal artery.  After this I used a  short nose [facility] Hawk 1 directional atherectomy device #4 multiple circumferential cuts in the distal right SFA CTO removing a moderate amount of atherosclerotic plaque.  I then used a 5 mm x 4 cm Admiral drug-eluting balloon for 2-1/2 minutes at nominal pressures at the atherectomized site reducing a total occlusion to 0% residual without dissection.  Following this there did not appear to be proximal flow and after visualization using steep oblique view there did appear to be a high-grade subtotal occlusion in the distal right common femoral ostial SFA which I opened up using a 5 mm x 80 mm long chocolate Cutting Balloon at 16 atm for 2 minutes resulting reduction with subtotal occlusion to less than 20% residual.  There was excellent flow both proximally and distally  after this.  The peroneal remained intact.  The 7 [place] Ansell sheath was then withdrawn across the bifurcation and exchanged over a versa core wire for a short 7 [place] sheath which was then secured in place.  The patient did receive 300 mg of p.o. Plavix at the end of the case.  Final Impression: Successful Hawk 1 directional atherectomy followed by drug-eluting balloon angioplasty using distal protection of the distal right SFA CTO.  In addition, she had chocolate balloon/Cutting Balloon angioplasty of the distal right common femoral, ostial right SFA.  The sheath will be removed once ACT falls below 170 and pressure held.  Patient will be treated with dual antiplatelet therapy, gently hydrated overnight and discharged home in the morning.  She will obtain lower extremity arterial Doppler studies in our [facility] line office next week and I will see her back 2 to 3 weeks thereafter.  She left the lab in stable condition.   History of Present Illness      [name] is a 72 y.o.  African American female with hx of CAD s/p CABG in March 2012 of the LIMA to LAD, vein to the diagonal branch, obtuse marginal branch and a vein to acute marginal branch, HTN, HLD, PVD who presented for outpatient PV angiogram and intervention.   Low risk stress test 11/2017.  PV angiography in 2015 revealing an occluded left SFA with one vessel runoff via the peroneal. I performed TurboHawk directional atherectomy followed by PTA using drug-eluting balloon with excellent angiographic result. Seen by Dr. Gwenlyn Found 01/31/18 for  increasing right lower extremity claudication with Dopplers 01/25/2018 revealing a right ABI 0.46 and a left of 1.95.  The right SFA and popliteal appear to be occluded.  She wishes to have these percutaneously addressed for lifestyle limiting claudication.  Hospital Course     Consultants: None  Detained result as above. S/p successful Hawk 1 directional atherectomy followed by drug-eluting balloon  angioplasty using distal protection of the distal right SFA CTO.  In addition, she had chocolate balloon/Cutting Balloon angioplasty of the distal right common femoral, ostial right SFA. Tolerated procedure well without complications. Hydrated overnight with stable renal function.  Continue DAPT with ASA and plavix. Intolerance to Lipitor in past.   11/14/2017: Cholesterol, Total 213; HDL 64; LDL Calculated 124; Triglycerides 127  She is willing to try low dose Crestor 77m qd. Up titrate as tolerated. LDL goal less than 70.  The patient has been seen by Dr. MAngelena Formtoday and deemed ready for discharge home. All follow-up appointments have been scheduled. Discharge medications are listed below.    Discharge Vitals Blood pressure 138/69, pulse 78, temperature 97.7 F (36.5 C), temperature source Oral, resp. rate 17, height  5' 3"  (1.6 m), weight 135 lb 5.8 oz (61.4 kg), SpO2 98 %.  Filed Weights   02/06/18 0545 02/07/18 0230  Weight: 134 lb (60.8 kg) 135 lb 5.8 oz (61.4 kg)   Physical Exam  Constitutional: She is well-developed, well-nourished, and in no distress.  HENT:  Head: Normocephalic and atraumatic.  Eyes: Pupils are equal, round, and reactive to light. Conjunctivae are normal.  Neck: Normal range of motion. Neck supple.  Cardiovascular: Normal rate and regular rhythm.  L groin cath site without any hematoma or bruise  Pulmonary/Chest: Effort normal and breath sounds normal.  Abdominal: Soft. Bowel sounds are normal.    Labs & Radiologic Studies    CBC Recent Labs    02/07/18 0228  WBC 4.6  HGB 13.2  HCT 41.4  MCV 88.3  PLT 594   Basic Metabolic Panel Recent Labs    02/07/18 0228  NA 143  K 3.7  CL 110  CO2 25  GLUCOSE 126*  BUN 7  CREATININE 0.71  CALCIUM 8.8*    No results found.  Disposition   Pt is being discharged home today in good condition.  Follow-up Plans & Appointments    Follow-up Information    Lorretta Harp, MD Follow up.     Specialties:  Cardiology, Radiology Why:  office will call with date and time to LE arterial doppler and appointment  Contact information: [address] [place] [address] [phone]          Discharge Instructions    Diet - low sodium heart healthy   Complete by:  As directed    Discharge instructions   Complete by:  As directed    No driving for 72 hoours. No lifting over 5 lbs for 1 week. No sexual activity for 1 week.  Keep procedure site clean & dry. If you notice increased pain, swelling, bleeding or pus, call/return!  You may shower, but no soaking baths/hot tubs/pools for 1 week.  Some studies suggest Prilosec/Omeprazole interacts with Plavix. We changed your Prilosec/Omeprazole to Protonix for less chance of interaction.   Increase activity slowly   Complete by:  As directed       Discharge Medications   Allergies as of 02/07/2018      Reactions   Atorvastatin Other (See Comments)   Muscle aches Other reaction(s): Other (See Comments) Muscle aches      Medication List    STOP taking these medications   omeprazole 40 MG capsule Commonly known as:  PRILOSEC Replaced by:  pantoprazole 40 MG tablet     TAKE these medications   amLODipine 10 MG tablet Commonly known as:  NORVASC TAKE ONE TABLET BY MOUTH ONCE DAILY   aspirin EC 81 MG tablet Take 81 mg by mouth daily.   CALCIUM 1000 + D PO Take 1 tablet by mouth every other day.   clopidogrel 75 MG tablet Commonly known as:  PLAVIX Take 1 tablet (75 mg total) by mouth daily with breakfast.   co-enzyme Q-10 30 MG capsule Take 30 mg by mouth every other day.   levothyroxine 150 MCG tablet Commonly known as:  SYNTHROID, LEVOTHROID Take 150 mcg by mouth daily before breakfast.   multivitamin with minerals Tabs tablet Take 1 tablet by mouth every morning. Centrum   pantoprazole 40 MG tablet Commonly known as:  PROTONIX Take 1 tablet (40 mg total) by mouth daily. Replaces:   omeprazole 40 MG capsule   rosuvastatin 10 MG tablet Commonly  known as:  CRESTOR Take 1 tablet (10 mg total) by mouth at bedtime.   SYSTANE COMPLETE 0.6 % Soln Generic drug:  Propylene Glycol Place 1 drop into both eyes every morning.   tiZANidine 4 MG tablet Commonly known as:  ZANAFLEX Take 1 tablet (4 mg total) by mouth at bedtime. What changed:    when to take this  reasons to take this        Outstanding Labs/Studies   Lipid panel and LFTs in 6 weeks.   Duration of Discharge Encounter   Greater than 30 minutes including physician time.  Signed, Crista Luria Bhagat PA-C 02/07/2018, 8:15 AM  I have personally seen and examined this patient. I agree with the assessment and plan as outlined above. She is s/p PV intervention of the right leg as above. She is doing well this am. No complaints. Left groin without hematoma.  Will discharge home today and arrange f/u in [facility] clinic with Dr. Gwenlyn Found.   Lauree Chandler 02/07/2018 9:15 AM

## 2018-02-13 ENCOUNTER — Other Ambulatory Visit: Payer: Self-pay | Admitting: Cardiovascular Disease

## 2018-02-13 ENCOUNTER — Telehealth: Payer: Self-pay | Admitting: Cardiovascular Disease

## 2018-02-13 DIAGNOSIS — I739 Peripheral vascular disease, unspecified: Secondary | ICD-10-CM

## 2018-02-13 NOTE — Telephone Encounter (Signed)
Pt wants to know if ok that she eat salads while taking Plavix? Pls advise [phone]

## 2018-02-13 NOTE — Telephone Encounter (Signed)
Spoke with pt and advised that she is ok to eat salad. Pt verbalized understanding.

## 2018-02-14 ENCOUNTER — Other Ambulatory Visit: Payer: Self-pay | Admitting: Cardiovascular Disease

## 2018-02-14 DIAGNOSIS — I739 Peripheral vascular disease, unspecified: Secondary | ICD-10-CM

## 2018-02-20 ENCOUNTER — Ambulatory Visit (HOSPITAL_COMMUNITY)
Admission: RE | Admit: 2018-02-20 | Discharge: 2018-02-20 | Disposition: A | Discharge status: Home / Self Care | Payer: Medicare HMO | Source: Ambulatory Visit | Attending: Cardiovascular Disease | Admitting: Cardiovascular Disease

## 2018-02-20 DIAGNOSIS — Z87891 Personal history of nicotine dependence: Secondary | ICD-10-CM | POA: Insufficient documentation

## 2018-02-20 DIAGNOSIS — I2510 Atherosclerotic heart disease of native coronary artery without angina pectoris: Secondary | ICD-10-CM | POA: Diagnosis not present

## 2018-02-20 DIAGNOSIS — I739 Peripheral vascular disease, unspecified: Secondary | ICD-10-CM

## 2018-02-20 DIAGNOSIS — E785 Hyperlipidemia, unspecified: Secondary | ICD-10-CM | POA: Insufficient documentation

## 2018-02-20 DIAGNOSIS — I10 Essential (primary) hypertension: Secondary | ICD-10-CM | POA: Diagnosis not present

## 2018-02-20 DIAGNOSIS — Z9862 Peripheral vascular angioplasty status: Secondary | ICD-10-CM | POA: Diagnosis not present

## 2018-02-20 DIAGNOSIS — E1151 Type 2 diabetes mellitus with diabetic peripheral angiopathy without gangrene: Secondary | ICD-10-CM | POA: Insufficient documentation

## 2018-02-21 ENCOUNTER — Ambulatory Visit: Payer: Medicare HMO | Admitting: Sports Medicine

## 2018-02-21 ENCOUNTER — Encounter: Payer: Self-pay | Admitting: Sports Medicine

## 2018-02-21 DIAGNOSIS — E119 Type 2 diabetes mellitus without complications: Secondary | ICD-10-CM | POA: Diagnosis not present

## 2018-02-21 DIAGNOSIS — M79676 Pain in unspecified toe(s): Secondary | ICD-10-CM | POA: Diagnosis not present

## 2018-02-21 DIAGNOSIS — M21619 Bunion of unspecified foot: Secondary | ICD-10-CM

## 2018-02-21 DIAGNOSIS — B351 Tinea unguium: Secondary | ICD-10-CM

## 2018-02-21 NOTE — Progress Notes (Signed)
Patient ID: [name], female   DOB: 06-10-1946, 72 y.o.   MRN: [id number] Subjective: [name] is a 72 y.o. female patient with history of diabetes who presents to office today complaining of long, painful nails  while ambulating in shoes; unable to trim. Patient states that the glucose reading this morning was 53m/dl. Patient admits denies any new changes in medication or new problems.   Had vascular procedure by Dr. BGwenlyn Found Patient Active Problem List   Diagnosis Date Noted  . Claudication in peripheral vascular disease ([place] 02/06/2018  . CAD in native artery 06/08/2016  . Skin inflammation 04/06/2016  . Contracture of hand joint 04/06/2016  . Microhematuria 11/12/2015  . Left shoulder pain 10/15/2015  . Trapezius muscle spasm 09/24/2015  . Type 2 diabetes mellitus ([facility] 08/07/2015  . Urinary frequency 04/04/2015  . External hemorrhoid 11/02/2014  . Left-sided thoracic back pain 10/03/2014  . Gingivitis 10/03/2014  . Routine general medical examination at a health care facility 09/10/2014  . Total knee replacement status 06/18/2014  . Tooth pain 04/18/2014  . Claudication ([facility] 03/21/2014  . Primary localized osteoarthrosis, lower leg 08/10/2013  . Right knee pain 08/02/2013  . Upper respiratory infection 08/02/2013  . Dyspepsia 11/16/2012  . Knee pain, right 02/18/2012  . PAD (peripheral artery disease) ([facility] 12/03/2011  . PVD (peripheral vascular disease) ([facility] 10/31/2011  . RSD (reflex sympathetic dystrophy) 10/31/2011  . Vitamin D deficiency   . Preop exam for internal medicine 10/28/2011  . Anxiety 03/11/2011  . COPD (chronic obstructive pulmonary disease) ([facility] 02/02/2011  . Anemia 02/02/2011  . History of rheumatic fever 02/02/2011  . Insomnia 02/02/2011  . Coronary atherosclerosis 09/08/2010  . CONSTIPATION 02/19/2010  . TOBACCO ABUSE 07/21/2009  . MURMUR 06/17/2009  . DConleyDISEASE, CERVICAL 03/20/2009  . Pain in Soft Tissues of Limb 05/23/2008  .  Hypothyroidism 09/07/2007  . Hyperlipidemia LDL goal <70 09/07/2007  . Essential hypertension 09/07/2007  . ALLERGIC RHINITIS 09/07/2007  . GERD 09/07/2007  . Primary osteoarthritis of right knee 09/07/2007  . Osteopenia 09/07/2007  . COLONIC POLYPS, HX OF 09/07/2007   Current Outpatient Medications on File Prior to Visit  Medication Sig Dispense Refill  . amLODipine (NORVASC) 10 MG tablet TAKE ONE TABLET BY MOUTH ONCE DAILY 90 tablet 1  . aspirin EC 81 MG tablet Take 81 mg by mouth daily.    . Calcium Carb-Cholecalciferol (CALCIUM 1000 + D PO) Take 1 tablet by mouth every other day.     . clopidogrel (PLAVIX) 75 MG tablet Take 1 tablet (75 mg total) by mouth daily with breakfast. 90 tablet 3  . co-enzyme Q-10 30 MG capsule Take 30 mg by mouth every other day.     . levothyroxine (SYNTHROID, LEVOTHROID) 150 MCG tablet Take 150 mcg by mouth daily before breakfast.    . Multiple Vitamin (MULTIVITAMIN WITH MINERALS) TABS tablet Take 1 tablet by mouth every morning. Centrum    . pantoprazole (PROTONIX) 40 MG tablet Take 1 tablet (40 mg total) by mouth daily. 30 tablet 6  . Propylene Glycol (SYSTANE COMPLETE) 0.6 % SOLN Place 1 drop into both eyes every morning.    . rosuvastatin (CRESTOR) 10 MG tablet Take 1 tablet (10 mg total) by mouth at bedtime. 30 tablet 6  . tiZANidine (ZANAFLEX) 4 MG tablet Take 1 tablet (4 mg total) by mouth at bedtime. (Patient taking differently: Take 4 mg by mouth at bedtime as needed for muscle spasms. ) 30 tablet 0  Current Facility-Administered Medications on File Prior to Visit  Medication Dose Route Frequency Provider Last Rate Last Dose  . tranexamic acid (CYKLOKAPRON) 2,000 mg in sodium chloride 0.9 % 50 mL Topical Application  2,595 mg Topical Once Cecilio Asper, [employer], PA-C       Allergies  Allergen Reactions  . Atorvastatin Other (See Comments)    Muscle aches Other reaction(s): Other (See Comments) Muscle aches    Recent Results (from the past 2160  hour(s))  MYOCARDIAL PERFUSION IMAGING     Status: None   Collection Time: 11/24/17 12:41 PM  Result Value Ref Range   Rest HR 75 bpm   Rest BP 137/73 mmHg   Peak HR 106 bpm   Peak BP 145/64 mmHg   SSS 1    SRS 0    SDS 1    TID 1.10    LV sys vol 19 mL   LV dias vol 59 46 - 106 mL  Basic metabolic panel     Status: None   Collection Time: 01/31/18 10:35 AM  Result Value Ref Range   Glucose 97 65 - 99 mg/dL   BUN 9 8 - 27 mg/dL   Creatinine, Ser 0.66 0.57 - 1.00 mg/dL   GFR calc non Af Amer 88 >59 mL/min/1.73   GFR calc Af Amer 101 >59 mL/min/1.73   BUN/Creatinine Ratio 14 12 - 28   Sodium 141 134 - 144 mmol/L   Potassium 3.8 3.5 - 5.2 mmol/L   Chloride 102 96 - 106 mmol/L   CO2 21 20 - 29 mmol/L   Calcium 9.7 8.7 - 10.3 mg/dL  CBC     Status: None   Collection Time: 01/31/18 10:35 AM  Result Value Ref Range   WBC 5.0 3.4 - 10.8 x10E3/uL   RBC 5.18 3.77 - 5.28 x10E6/uL   Hemoglobin 15.5 11.1 - 15.9 g/dL   Hematocrit 44.1 34.0 - 46.6 %   MCV 85 79 - 97 fL   MCH 29.9 26.6 - 33.0 pg   MCHC 35.1 31.5 - 35.7 g/dL   RDW 14.5 12.3 - 15.4 %   Platelets 287 150 - 450 x10E3/uL  Protime-INR     Status: None   Collection Time: 01/31/18 10:35 AM  Result Value Ref Range   INR 0.9 0.8 - 1.2    Comment: Reference interval is for non-anticoagulated patients. Suggested INR therapeutic range for Vitamin K antagonist therapy:    Standard Dose (moderate intensity                   therapeutic range):       2.0 - 3.0    Higher intensity therapeutic range       2.5 - 3.5    Prothrombin Time 9.8 9.1 - 12.0 sec  Glucose, capillary     Status: Abnormal   Collection Time: 02/06/18  6:42 AM  Result Value Ref Range   Glucose-Capillary 119 (H) 65 - 99 mg/dL   Comment 1 Document in Chart   POCT Activated clotting time     Status: None   Collection Time: 02/06/18  8:31 AM  Result Value Ref Range   Activated Clotting Time 263 seconds  POCT Activated clotting time     Status: None    Collection Time: 02/06/18  9:17 AM  Result Value Ref Range   Activated Clotting Time 268 seconds  Glucose, capillary     Status: Abnormal   Collection Time: 02/06/18 12:13 PM  Result Value Ref  Range   Glucose-Capillary 109 (H) 65 - 99 mg/dL   Comment 1 Notify RN    Comment 2 Document in Chart   POCT Activated clotting time     Status: None   Collection Time: 02/06/18 12:30 PM  Result Value Ref Range   Activated Clotting Time 164 seconds  Glucose, capillary     Status: Abnormal   Collection Time: 02/06/18  5:36 PM  Result Value Ref Range   Glucose-Capillary 113 (H) 65 - 99 mg/dL   Comment 1 Notify RN    Comment 2 Document in Chart   Glucose, capillary     Status: Abnormal   Collection Time: 02/06/18  9:50 PM  Result Value Ref Range   Glucose-Capillary 133 (H) 65 - 99 mg/dL   Comment 1 Notify RN    Comment 2 Document in Chart   Basic metabolic panel      Status: Abnormal   Collection Time: 02/07/18  2:28 AM  Result Value Ref Range   Sodium 143 135 - 145 mmol/L   Potassium 3.7 3.5 - 5.1 mmol/L   Chloride 110 101 - 111 mmol/L   CO2 25 22 - 32 mmol/L   Glucose, Bld 126 (H) 65 - 99 mg/dL   BUN 7 6 - 20 mg/dL   Creatinine, Ser 0.71 0.44 - 1.00 mg/dL   Calcium 8.8 (L) 8.9 - 10.3 mg/dL   GFR calc non Af Amer >60 >60 mL/min   GFR calc Af Amer >60 >60 mL/min    Comment: (NOTE) The eGFR has been calculated using the CKD EPI equation. This calculation has not been validated in all clinical situations. eGFR's persistently <60 mL/min signify possible Chronic Kidney Disease.    Anion gap 8 5 - 15    Comment: Performed at [facility] [address] [place] [address]  CBC     Status: None   Collection Time: 02/07/18  2:28 AM  Result Value Ref Range   WBC 4.6 4.0 - 10.5 K/uL   RBC 4.69 3.87 - 5.11 MIL/uL   Hemoglobin 13.2 12.0 - 15.0 g/dL   HCT 41.4 36.0 - 46.0 %   MCV 88.3 78.0 - 100.0 fL   MCH 28.1 26.0 - 34.0 pg   MCHC 31.9 30.0 - 36.0 g/dL   RDW 14.2 11.5 -  15.5 %   Platelets 233 150 - 400 K/uL    Comment: Performed at [facility], [place] [address] [place], [place] [address]  Glucose, capillary     Status: Abnormal   Collection Time: 02/07/18  6:35 AM  Result Value Ref Range   Glucose-Capillary 116 (H) 65 - 99 mg/dL   Comment 1 Notify RN    Comment 2 Document in Chart     Objective: General: Patient is awake, alert, and oriented x 3 and in no acute distress.  Integument: Skin is warm, dry and supple bilateral. Nails are tender, long, thickened and dystrophic with subungual debris, consistent with onychomycosis, 1-5 bilateral. No signs of infection. No open lesions or preulcerative lesions present bilateral. Remaining integument unremarkable.  Vasculature:  Dorsalis Pedis pulse 1/4 bilateral. Posterior Tibial pulse  1/4 bilateral. Capillary fill time <3 sec 1-5 bilateral. Positive hair growth to the level of the digits.Temperature gradient within normal limits. No varicosities present bilateral. No edema present bilateral.   Neurology: The patient has intact sensation measured with a 5.07/10g Semmes Weinstein Monofilament at all pedal sites bilateral . Vibratory sensation intact bilateral with  tuning fork. No Babinski sign present bilateral.   Musculoskeletal: Asymptomatic bunion and hammertoe gross pedal deformities noted bilateral, L>R. Muscular strength 5/5 in all lower extremity muscular groups bilateral without pain on range of motion . No tenderness with calf compression bilateral.  Assessment and Plan: Problem List Items Addressed This Visit    None    Visit Diagnoses    Pain due to onychomycosis of toenail    -  Primary   Diabetes mellitus without complication (HCC)       Bunion       L     -Examined patient. -Discussed and educated patient on diabetic foot care, especially with  regards to the vascular, neurological and musculoskeletal systems.  -Stressed the importance of good glycemic control and the detriment of not   controlling glucose levels in relation to the foot. -Mechanically debrided all nails 1-5 bilateral using sterile nail nipper and filed with dremel without incident  -Recommend continue with good supportive shoes daily for foot type and dispensed bunion padding for left -Answered all patient questions -Patient to return in 3 months for at risk foot care -Patient advised to call the office if any problems or questions arise in the meantime.  Landis Martins, DPM

## 2018-02-24 ENCOUNTER — Ambulatory Visit (INDEPENDENT_AMBULATORY_CARE_PROVIDER_SITE_OTHER): Payer: Medicare HMO | Admitting: Cardiovascular Disease

## 2018-02-24 ENCOUNTER — Encounter: Payer: Self-pay | Admitting: Cardiovascular Disease

## 2018-02-24 VITALS — BP 120/84 | HR 85 | Ht 63.0 in | Wt 137.0 lb

## 2018-02-24 DIAGNOSIS — I739 Peripheral vascular disease, unspecified: Secondary | ICD-10-CM

## 2018-02-24 NOTE — Patient Instructions (Signed)
Medication Instructions: Your physician recommends that you continue on your current medications as directed. Please refer to the Current Medication list given to you today.   Testing/Procedures:  Every 6 months: Your physician has requested that you have a lower extremity arterial duplex. During this test, ultrasound is used to evaluate arterial blood flow in the legs. Allow one hour for this exam. There are no restrictions or special instructions.  Your physician has requested that you have an ankle brachial index (ABI). During this test an ultrasound and blood pressure cuff are used to evaluate the arteries that supply the arms and legs with blood. Allow thirty minutes for this exam. There are no restrictions or special instructions.  Follow-Up: Your physician wants you to follow-up in: 1 year with Dr. Gwenlyn Found. You will receive a reminder letter in the mail two months in advance. If you don't receive a letter, please call our office to schedule the follow-up appointment.  If you need a refill on your cardiac medications before your next appointment, please call your pharmacy.

## 2018-02-24 NOTE — Assessment & Plan Note (Signed)
[name] returns today for post hospital follow-up.  She was seen for right lower extremity claudication with an ABI in the 0.46 range on the right.  I angiogram to her on 02/06/2018 revealing a 90% distal right common femoral artery stenosis and an occluded distal right SFA with one-vessel runoff via peroneal.  I performed [facility] 1 directional atherectomy followed by drug-eluting balloon angioplasty of the distal right SFA CTO.  I also performed chocolate balloon angioplasty of the distal right common femoral artery.  Her right ABI improved 0.82 and her claudication resolved.

## 2018-02-24 NOTE — Progress Notes (Signed)
02/24/2018 [name]   June 16, 1946  [id number]  Primary Physician Reynold Bowen, MD Primary Cardiologist: Lorretta Harp MD Garret Reddish, [place], [place]  HPI:  [name] is a 72 y.o.  African American female patient of Dr. Jacalyn Lefevre referred for peripheral vascular evaluation. She was seen by Dr. Gladstone Lighter orthopedic surgeon, who referred her here. I last saw her in the office 01/31/2018. Her past history is remarkable for ischemic heart disease status post coronary bypass grafting in March 2012 of the LIMA to LAD, vein to the diagonal branch, obtuse marginal branch and a vein to acute marginal branch. She has normal LV function by 2-D echo. Further problems include hypertension, and hyperlipidemia. She complains of right lower extremity claudication and had seen Dr. Fletcher Anon in the past who thought that her pain was arthritic in nature. Lower extremity arterial Doppler studies performed 07/07/14 revealed a right ABI 0.43 with an occluded distal right SFA and popliteal artery, left ABI 0.73 with a high-frequency signal in the mid left SFA. I performed angiography on her 753/15 revealing an occluded left SFA with one vessel runoff via the peroneal. I performed [employer] directional atherectomy followed by PTA using drug-eluting balloon with excellent angiographic result. Her followup Doppler revealed an increase in her right ABI from 0.4 to .7 with resolution of her claudication symptoms. Since I saw her over 2 years ago she developed claudication in her left leg now. Isuspected thatherpreviouslydocumented lesions in her left external leg artery and SFA have progressed.She underwent angiography by myself 06/07/16 revealing 80% segmental mid left SFA stenosis. I performed [facility] 1 directional atherectomy followed by drug-eluting balloon into plasty. She will vessel runoff via peroneal. She did have 50% distal left common iliac artery stenosis without a gradient. Her follow-up lower extremity  Doppler studies performed 06/24/16 were markedly improved ABI 1.04. Because of right lower extremity claudication and markedly reduced right ABI of 0.46 she underwent angiography by myself on 02/06/2018 really 90% distal right common femoral artery stenosis, total occlusion in the distal right SFA with one-vessel runoff via peroneal.  I performed directional atherectomy followed by drug-eluting balloon angioplasty of the distal SFA occlusion and chocolate balloon angioplasty of the distal right common femoral artery.  She had follow-up Doppler studies performed in our office 08/25/2017 revealed marked improvement with a right ABI of 0.82.  Her claudication has markedly improved.     Current Meds  Medication Sig  . amLODipine (NORVASC) 10 MG tablet TAKE ONE TABLET BY MOUTH ONCE DAILY  . aspirin EC 81 MG tablet Take 81 mg by mouth daily.  . Calcium Carb-Cholecalciferol (CALCIUM 1000 + D PO) Take 1 tablet by mouth every other day.   . clopidogrel (PLAVIX) 75 MG tablet Take 1 tablet (75 mg total) by mouth daily with breakfast.  . co-enzyme Q-10 30 MG capsule Take 30 mg by mouth every other day.   . levothyroxine (SYNTHROID, LEVOTHROID) 150 MCG tablet Take 150 mcg by mouth daily before breakfast.  . Multiple Vitamin (MULTIVITAMIN WITH MINERALS) TABS tablet Take 1 tablet by mouth every morning. Centrum  . pantoprazole (PROTONIX) 40 MG tablet Take 1 tablet (40 mg total) by mouth daily.  Marland Kitchen Propylene Glycol (SYSTANE COMPLETE) 0.6 % SOLN Place 1 drop into both eyes every morning.  . rosuvastatin (CRESTOR) 10 MG tablet Take 1 tablet (10 mg total) by mouth at bedtime.  Marland Kitchen tiZANidine (ZANAFLEX) 4 MG tablet Take 1 tablet (4 mg total) by mouth at bedtime. (Patient taking  differently: Take 4 mg by mouth at bedtime as needed for muscle spasms. )     Allergies  Allergen Reactions  . Atorvastatin Other (See Comments)    Muscle aches Other reaction(s): Other (See Comments) Muscle aches    Social History    Socioeconomic History  . Marital status: Divorced    Spouse name: Not on file  . Number of children: 2  . Years of education: 20  . Highest education level: Not on file  Occupational History  . Occupation: Retired    Fish farm manager: RETIRED  Social Needs  . Financial resource strain: Not on file  . Food insecurity:    Worry: Not on file    Inability: Not on file  . Transportation needs:    Medical: Not on file    Non-medical: Not on file  Tobacco Use  . Smoking status: Former Smoker    Packs/day: 0.80    Years: 45.00    Pack years: 36.00    Types: Cigarettes    Last attempt to quit: 08/23/2009    Years since quitting: 8.5  . Smokeless tobacco: Never Used  Substance and Sexual Activity  . Alcohol use: Yes    Alcohol/week: 1.2 oz    Types: 2 Glasses of wine per week  . Drug use: Never  . Sexual activity: Not Currently  Lifestyle  . Physical activity:    Days per week: Not on file    Minutes per session: Not on file  . Stress: Not on file  Relationships  . Social connections:    Talks on phone: Not on file    Gets together: Not on file    Attends religious service: Not on file    Active member of club or organization: Not on file    Attends meetings of clubs or organizations: Not on file    Relationship status: Not on file  . Intimate partner violence:    Fear of current or ex partner: Not on file    Emotionally abused: Not on file    Physically abused: Not on file    Forced sexual activity: Not on file  Other Topics Concern  . Not on file  Social History Narrative  . Not on file     Review of Systems: General: negative for chills, fever, night sweats or weight changes.  Cardiovascular: negative for chest pain, dyspnea on exertion, edema, orthopnea, palpitations, paroxysmal nocturnal dyspnea or shortness of breath Dermatological: negative for rash Respiratory: negative for cough or wheezing Urologic: negative for hematuria Abdominal: negative for nausea,  vomiting, diarrhea, bright red blood per rectum, melena, or hematemesis Neurologic: negative for visual changes, syncope, or dizziness All other systems reviewed and are otherwise negative except as noted above.    Blood pressure 120/84, pulse 85, height 5\' 3"  (1.6 m), weight 137 lb (62.1 kg).  General appearance: alert and no distress Neck: no adenopathy, no carotid bruit, no JVD, supple, symmetrical, trachea midline and thyroid not enlarged, symmetric, no tenderness/mass/nodules Lungs: clear to auscultation bilaterally Heart: regular rate and rhythm, S1, S2 normal, no murmur, click, rub or gallop Extremities: extremities normal, atraumatic, no cyanosis or edema Pulses: Diminished pedal pulses bilaterally Skin: Skin color, texture, turgor normal. No rashes or lesions Neurologic: Alert and oriented X 3, normal strength and tone. Normal symmetric reflexes. Normal coordination and gait  EKG not performed today  ASSESSMENT AND PLAN:   Claudication in peripheral vascular disease [facility]) Ms. Konz returns today for post hospital follow-up.  She was  seen for right lower extremity claudication with an ABI in the 0.46 range on the right.  I angiogram to her on 02/06/2018 revealing a 90% distal right common femoral artery stenosis and an occluded distal right SFA with one-vessel runoff via peroneal.  I performed [facility] 1 directional atherectomy followed by drug-eluting balloon angioplasty of the distal right SFA CTO.  I also performed chocolate balloon angioplasty of the distal right common femoral artery.  Her right ABI improved 0.82 and her claudication resolved.      Lorretta Harp MD FACP,FACC,FAHA, [facility] 02/24/2018 11:43 AM

## 2018-02-27 NOTE — Progress Notes (Signed)
HPI: FU coronary artery disease. S/P CABG March 2012 with a LIMA to the LAD, a sequential saphenous vein graft to the diagonal and obtuse marginal and a saphenous vein graft to the acute marginal. Fu echo in August of 2012 showed an EF of 50-55, atrial septal aneurysm.  Carotid Dopplers June 2015 showed moderate disease right greater than left with estimated stenosis less than 50% bilaterally.  Last nuclear study April 2019 showed ejection fraction 68% and normal perfusion.  Patient followed by Dr. Gwenlyn Found for peripheral vascular disease.  Multiple interventions previously.  Had atherectomy of distal right SFA June 2019.  Since last seen, she has some dyspnea on exertion but no orthopnea, PND, pedal edema or chest pain.  She continues with mild claudication but improved compared paired to prior to her recent procedure.  Current Outpatient Medications  Medication Sig Dispense Refill  . amLODipine (NORVASC) 10 MG tablet TAKE ONE TABLET BY MOUTH ONCE DAILY 90 tablet 1  . aspirin EC 81 MG tablet Take 81 mg by mouth daily.    . Calcium Carb-Cholecalciferol (CALCIUM 1000 + D PO) Take 1 tablet by mouth every other day.     . clopidogrel (PLAVIX) 75 MG tablet Take 1 tablet (75 mg total) by mouth daily with breakfast. 90 tablet 3  . co-enzyme Q-10 30 MG capsule Take 30 mg by mouth every other day.     . levothyroxine (SYNTHROID, LEVOTHROID) 150 MCG tablet Take 150 mcg by mouth daily before breakfast.    . Multiple Vitamin (MULTIVITAMIN WITH MINERALS) TABS tablet Take 1 tablet by mouth every morning. Centrum    . pantoprazole (PROTONIX) 40 MG tablet Take 1 tablet (40 mg total) by mouth daily. 30 tablet 6  . Propylene Glycol (SYSTANE COMPLETE) 0.6 % SOLN Place 1 drop into both eyes every morning.    . rosuvastatin (CRESTOR) 10 MG tablet Take 1 tablet (10 mg total) by mouth at bedtime. 30 tablet 6  . tiZANidine (ZANAFLEX) 4 MG tablet Take 1 tablet (4 mg total) by mouth at bedtime. (Patient taking  differently: Take 4 mg by mouth at bedtime as needed for muscle spasms. ) 30 tablet 0   No current facility-administered medications for this visit.    Facility-Administered Medications Ordered in Other Visits  Medication Dose Route Frequency Provider Last Rate Last Dose  . tranexamic acid (CYKLOKAPRON) 2,000 mg in sodium chloride 0.9 % 50 mL Topical Application  9,629 mg Topical Once Ardeen Jourdain, PA-C         Past Medical History:  Diagnosis Date  . Anemia   . Anxiety    hx  . Arthritis    "my whole body"  . Atrial septal aneurysm   . COLONIC POLYPS, HX OF   . COPD (chronic obstructive pulmonary disease) ([place])    no per pt on 03/21/2014 & 02/06/2018  . Coronary artery disease    a. s/p CABG 2012.  Marland Kitchen DISC DISEASE, CERVICAL   . Gastroesophageal reflux disease   . Headache    "I have headaches all the time" (02/06/2018)  . Heart murmur    as a child  . History of rheumatic fever   . Hyperlipidemia   . Hypertension   . Hypothyroidism   . Insomnia   . Osteoarthritis   . OSTEOPENIA   . PAD (peripheral artery disease) ([place])    a. PTA to R SFA 2015. b. PTA to L SFA 05/2016.  . Pre-diabetes   . 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 Vitamin D deficiency     Past Surgical History:  Procedure Laterality Date  . ABDOMINAL HYSTERECTOMY  1977   "partial"  . BALLOON ANGIOPLASTY, ARTERY Right 03/21/2014   SFA  . CARDIAC CATHETERIZATION  2011   . CARPAL TUNNEL RELEASE Right 2005  . CATARACT EXTRACTION Left 2013  . CORONARY ARTERY BYPASS GRAFT  10/2009   LIMA to the LAD, saphenous vein graft to the acute marginal, saphenous vein graft to the diagonal and obtuse marginal.  . DILATION AND CURETTAGE OF UTERUS  1978  . JOINT REPLACEMENT    . LOWER EXTREMITY ANGIOGRAM N/A 03/21/2014   Procedure: LOWER EXTREMITY ANGIOGRAM;  Surgeon: Lorretta Harp, MD;  Location: [facility] CATH LAB;  Service: Cardiovascular;  Laterality: N/A;  . LOWER EXTREMITY ANGIOGRAM  06/07/2016   Abdominal  aortogram/bilateral iliac angiogram/bifemoral runoff  . LOWER EXTREMITY ANGIOGRAPHY N/A 02/06/2018   Procedure: LOWER EXTREMITY ANGIOGRAPHY;  Surgeon: Lorretta Harp, MD;  Location: [place] CV LAB;  Service: Cardiovascular;  Laterality: N/A;  . PERIPHERAL VASCULAR ATHERECTOMY  02/06/2018   Procedure: PERIPHERAL VASCULAR ATHERECTOMY;  Surgeon: Lorretta Harp, MD;  Location: [place] CV LAB;  Service: Cardiovascular;;  . PERIPHERAL VASCULAR CATHETERIZATION Bilateral 06/07/2016   Procedure: Lower Extremity Angiography;  Surgeon: Lorretta Harp, MD;  Location: [place] CV LAB;  Service: Cardiovascular;  Laterality: Bilateral;  . PERIPHERAL VASCULAR CATHETERIZATION Left 06/07/2016   Procedure: Peripheral Vascular Atherectomy;  Surgeon: Lorretta Harp, MD;  Location: [place] CV LAB;  Service: Cardiovascular;  Laterality: Left;  SFA  . PERIPHERAL VASCULAR CATHETERIZATION Left 06/07/2016   Procedure: Peripheral Vascular Balloon Angioplasty;  Surgeon: Lorretta Harp, MD;  Location: [place] CV LAB;  Service: Cardiovascular;  Laterality: Left;  SFA  . THYROIDECTOMY  1995  . TONSILLECTOMY    . TOTAL KNEE ARTHROPLASTY Right 06/18/2014   Procedure: RIGHT TOTAL KNEE ARTHROPLASTY;  Surgeon: Tobi Bastos, MD;  Location: [facility] ORS;  Service: Orthopedics;  Laterality: Right;    Social History   Socioeconomic History  . Marital status: Divorced    Spouse name: Not on file  . Number of children: 2  . Years of education: 9  . Highest education level: Not on file  Occupational History  . Occupation: Retired    Fish farm manager: RETIRED  Social Needs  . Financial resource strain: Not on file  . Food insecurity:    Worry: Not on file    Inability: Not on file  . Transportation needs:    Medical: Not on file    Non-medical: Not on file  Tobacco Use  . Smoking status: Former Smoker    Packs/day: 0.80    Years: 45.00    Pack years: 36.00    Types: Cigarettes    Last attempt to  quit: 08/23/2009    Years since quitting: 8.5  . Smokeless tobacco: Never Used  Substance and Sexual Activity  . Alcohol use: Yes    Alcohol/week: 1.2 oz    Types: 2 Glasses of wine per week  . Drug use: Never  . Sexual activity: Not Currently  Lifestyle  . Physical activity:    Days per week: Not on file    Minutes per session: Not on file  . Stress: Not on file  Relationships  . Social connections:    Talks on phone: Not on file    Gets together: Not on file    Attends religious service: Not on file  Active member of club or organization: Not on file    Attends meetings of clubs or organizations: Not on file    Relationship status: Not on file  . Intimate partner violence:    Fear of current or ex partner: Not on file    Emotionally abused: Not on file    Physically abused: Not on file    Forced sexual activity: Not on file  Other Topics Concern  . Not on file  Social History Narrative  . Not on file    Family History  Problem Relation Age of Onset  . Arthritis Maternal Aunt   . Heart disease Maternal Aunt   . Hypertension Maternal Aunt   . Diabetes Maternal Aunt   . Colon cancer Neg Hx   . Kidney disease Neg Hx   . Liver disease Neg Hx   . Throat cancer Neg Hx   . Stomach cancer Neg Hx     ROS: no fevers or chills, productive cough, hemoptysis, dysphasia, odynophagia, melena, hematochezia, dysuria, hematuria, rash, seizure activity, orthopnea, PND, pedal edema. Remaining systems are negative.  Physical Exam: Well-developed well-nourished in no acute distress.  Skin is warm and dry.  HEENT is normal.  Neck is supple.  Chest is clear to auscultation with normal expansion.  Cardiovascular exam is regular rate and rhythm.  Abdominal exam nontender or distended. No masses palpated. Extremities show no edema. neuro grossly intact  A/P  1 coronary artery disease status post coronary artery bypass and graft-patient denies recurrent chest pain.  Continue medical  therapy including aspirin and statin.  2 hypertension-blood pressure is controlled.  Continue present medications.  3 hyperlipidemia-recent LDL not at goal.  Increase Crestor to 40 mg daily.  Check lipids and liver in 4 weeks.  4 peripheral vascular disease-followed by Dr. Gwenlyn Found.  Continue aspirin and statin.  5 Carotid artery disease-plan repeat carotid Dopplers.  Kirk Ruths, MD

## 2018-02-28 ENCOUNTER — Other Ambulatory Visit: Payer: Self-pay | Admitting: *Deleted

## 2018-02-28 DIAGNOSIS — I739 Peripheral vascular disease, unspecified: Secondary | ICD-10-CM

## 2018-03-06 ENCOUNTER — Ambulatory Visit: Payer: Medicare HMO | Admitting: Cardiology

## 2018-03-06 ENCOUNTER — Encounter: Payer: Self-pay | Admitting: Cardiology

## 2018-03-06 VITALS — BP 128/80 | HR 86 | Ht 63.0 in | Wt 136.4 lb

## 2018-03-06 DIAGNOSIS — I2510 Atherosclerotic heart disease of native coronary artery without angina pectoris: Secondary | ICD-10-CM

## 2018-03-06 DIAGNOSIS — I739 Peripheral vascular disease, unspecified: Secondary | ICD-10-CM | POA: Diagnosis not present

## 2018-03-06 DIAGNOSIS — E7800 Pure hypercholesterolemia, unspecified: Secondary | ICD-10-CM

## 2018-03-06 DIAGNOSIS — I679 Cerebrovascular disease, unspecified: Secondary | ICD-10-CM

## 2018-03-06 MED ORDER — ROSUVASTATIN CALCIUM 40 MG PO TABS: 40.0000 mg | ORAL_TABLET | Freq: Every day | ORAL | 3 refills | Status: DC

## 2018-03-06 NOTE — Patient Instructions (Signed)
Medication Instructions:   INCREASE ROSUVASTATIN TO 40 MG ONCE DAILY= 4 OF THE 10 MG TABLETS ONCE DAILY  Labwork:  Your physician recommends that you return for lab work in: [place]  Testing/Procedures:  Your physician has requested that you have a carotid duplex. This test is an ultrasound of the carotid arteries in your neck. It looks at blood flow through these arteries that supply the brain with blood. Allow one hour for this exam. There are no restrictions or special instructions.    Follow-Up:  Your physician wants you to follow-up in: [place] will receive a reminder letter in the mail two months in advance. If you don't receive a letter, please call our office to schedule the follow-up appointment.   If you need a refill on your cardiac medications before your next appointment, please call your pharmacy.

## 2018-03-09 LAB — HEPATIC FUNCTION PANEL
ALT: 12 IU/L (ref 0–32)
AST: 14 IU/L (ref 0–40)
Albumin: 4.3 g/dL (ref 3.5–4.8)
Alkaline Phosphatase: 96 IU/L (ref 39–117)
Bilirubin Total: 0.3 mg/dL (ref 0.0–1.2)
Bilirubin, Direct: 0.12 mg/dL (ref 0.00–0.40)
Total Protein: 6.7 g/dL (ref 6.0–8.5)

## 2018-03-09 LAB — LIPID PANEL
Chol/HDL Ratio: 2.2 ratio (ref 0.0–4.4)
Cholesterol, Total: 112 mg/dL (ref 100–199)
HDL: 52 mg/dL (ref >39)
LDL Calculated: 46 mg/dL (ref 0–99)
Triglycerides: 72 mg/dL (ref 0–149)
VLDL Cholesterol Cal: 14 mg/dL (ref 5–40)

## 2018-03-13 ENCOUNTER — Ambulatory Visit (HOSPITAL_COMMUNITY)
Admission: RE | Admit: 2018-03-13 | Discharge: 2018-03-13 | Disposition: A | Discharge status: Home / Self Care | Payer: Medicare HMO | Source: Ambulatory Visit | Attending: Cardiovascular Disease | Admitting: Cardiovascular Disease

## 2018-03-13 DIAGNOSIS — I679 Cerebrovascular disease, unspecified: Secondary | ICD-10-CM | POA: Diagnosis not present

## 2018-05-14 ENCOUNTER — Encounter (HOSPITAL_COMMUNITY): Payer: Self-pay

## 2018-05-14 ENCOUNTER — Ambulatory Visit (HOSPITAL_COMMUNITY)
Admission: EM | Admit: 2018-05-14 | Discharge: 2018-05-14 | Disposition: A | Discharge status: Home / Self Care | Payer: Medicare HMO | Attending: Internal Medicine | Admitting: Internal Medicine

## 2018-05-14 ENCOUNTER — Ambulatory Visit (INDEPENDENT_AMBULATORY_CARE_PROVIDER_SITE_OTHER): Payer: Medicare HMO

## 2018-05-14 DIAGNOSIS — M546 Pain in thoracic spine: Secondary | ICD-10-CM

## 2018-05-14 DIAGNOSIS — R0789 Other chest pain: Secondary | ICD-10-CM

## 2018-05-14 MED ORDER — CYCLOBENZAPRINE HCL 5 MG PO TABS: 5.0000 mg | ORAL_TABLET | Freq: Every day | ORAL | 0 refills | Status: AC

## 2018-05-14 MED ORDER — NAPROXEN 375 MG PO TABS: 375.0000 mg | ORAL_TABLET | Freq: Two times a day (BID) | ORAL | 0 refills | Status: DC

## 2018-05-14 NOTE — ED Triage Notes (Signed)
Pt presents with back pain ans side pain on the right that is not associated with any injury and unrelieved by OTC medication.

## 2018-05-14 NOTE — ED Provider Notes (Signed)
[place]    CSN: [id number] Arrival date & time: 05/14/18  1002     History   Chief Complaint Chief Complaint  Patient presents with  . Back Pain  . Side Pain    HPI [name] is a 72 y.o. female history of COPD, CAD, hypertension, hyperlipidemia presenting today for evaluation of chest discomfort that wraps around to her side and back on the right side.  Patient states that yesterday she woke up with a discomfort in her central chest that would wraparound her right breast and into her shoulder blade.  Felt like the pain was severe that she could not move.  She stayed in bed yesterday, but slightly improved today.  She is also had a nonproductive cough for 1 week and whenever she coughs feels a pulling sensation in her chest.  Cough is worse at nighttime.  States that the pain has been off and on since it started yesterday.  She is tried baking soda and reflux medicines.  Denies any injury or increase in activity.  Denies nausea or vomiting.  She is mainly been drinking water, and has not been eating many foods.  She is also noticed her face and started to itch.  She took a Benadryl for this.  She states that she recently started cholesterol medicine as well as switched her reflux medicine.  She has not tried any thing else for her symptoms.  Denies previous DVT/PE.  Denies leg pain and leg swelling.  HPI  Past Medical History:  Diagnosis Date  . Anemia   . Anxiety    hx  . Arthritis    "my whole body"  . Atrial septal aneurysm   . COLONIC POLYPS, HX OF   . COPD (chronic obstructive pulmonary disease) ([place])    no per pt on 03/21/2014 & 02/06/2018  . Coronary artery disease    a. s/p CABG 2012.  Marland Kitchen DISC DISEASE, CERVICAL   . Gastroesophageal reflux disease   . Headache    "I have headaches all the time" (02/06/2018)  . Heart murmur    as a child  . History of rheumatic fever   . Hyperlipidemia   . Hypertension   . Hypothyroidism   . Insomnia   .  Osteoarthritis   . OSTEOPENIA   . PAD (peripheral artery disease) ([place])    a. PTA to R SFA 2015. b. PTA to L SFA 05/2016.  . Pre-diabetes   . RSD (reflex sympathetic dystrophy) 10/31/2011  . Vitamin D deficiency     Patient Active Problem List   Diagnosis Date Noted  . Claudication in peripheral vascular disease ([place]) 02/06/2018  . CAD in native artery 06/08/2016  . Skin inflammation 04/06/2016  . Contracture of hand joint 04/06/2016  . Microhematuria 11/12/2015  . Left shoulder pain 10/15/2015  . Trapezius muscle spasm 09/24/2015  . Type 2 diabetes mellitus ([place]) 08/07/2015  . Urinary frequency 04/04/2015  . External hemorrhoid 11/02/2014  . Left-sided thoracic back pain 10/03/2014  . Gingivitis 10/03/2014  . Routine general medical examination at a health care facility 09/10/2014  . Total knee replacement status 06/18/2014  . Tooth pain 04/18/2014  . Claudication ([place]) 03/21/2014  . Primary localized osteoarthrosis, lower leg 08/10/2013  . Right knee pain 08/02/2013  . Upper respiratory infection 08/02/2013  . Dyspepsia 11/16/2012  . Knee pain, right 02/18/2012  . PAD (peripheral artery disease) ([place]) 12/03/2011  . PVD (peripheral vascular disease) ([place]) 10/31/2011  . RSD (reflex sympathetic  dystrophy) 10/31/2011  . Vitamin D deficiency   . Preop exam for internal medicine 10/28/2011  . Anxiety 03/11/2011  . COPD (chronic obstructive pulmonary disease) ([place]) 02/02/2011  . Anemia 02/02/2011  . History of rheumatic fever 02/02/2011  . Insomnia 02/02/2011  . Coronary atherosclerosis 09/08/2010  . CONSTIPATION 02/19/2010  . TOBACCO ABUSE 07/21/2009  . MURMUR 06/17/2009  . [place] DISEASE, CERVICAL 03/20/2009  . Pain in Soft Tissues of Limb 05/23/2008  . Hypothyroidism 09/07/2007  . Hyperlipidemia LDL goal <70 09/07/2007  . Essential hypertension 09/07/2007  . ALLERGIC RHINITIS 09/07/2007  . GERD 09/07/2007  . Primary osteoarthritis of right knee 09/07/2007  .  Osteopenia 09/07/2007  . COLONIC POLYPS, HX OF 09/07/2007    Past Surgical History:  Procedure Laterality Date  . ABDOMINAL HYSTERECTOMY  1977   "partial"  . BALLOON ANGIOPLASTY, ARTERY Right 03/21/2014   SFA  . CARDIAC CATHETERIZATION  2011   . CARPAL TUNNEL RELEASE Right 2005  . CATARACT EXTRACTION Left 2013  . CORONARY ARTERY BYPASS GRAFT  10/2009   LIMA to the LAD, saphenous vein graft to the acute marginal, saphenous vein graft to the diagonal and obtuse marginal.  . DILATION AND CURETTAGE OF UTERUS  1978  . JOINT REPLACEMENT    . LOWER EXTREMITY ANGIOGRAM N/A 03/21/2014   Procedure: LOWER EXTREMITY ANGIOGRAM;  Surgeon: Lorretta Harp, MD;  Location: [facility] CATH LAB;  Service: Cardiovascular;  Laterality: N/A;  . LOWER EXTREMITY ANGIOGRAM  06/07/2016   Abdominal aortogram/bilateral iliac angiogram/bifemoral runoff  . LOWER EXTREMITY ANGIOGRAPHY N/A 02/06/2018   Procedure: LOWER EXTREMITY ANGIOGRAPHY;  Surgeon: Lorretta Harp, MD;  Location: [place] CV LAB;  Service: Cardiovascular;  Laterality: N/A;  . PERIPHERAL VASCULAR ATHERECTOMY  02/06/2018   Procedure: PERIPHERAL VASCULAR ATHERECTOMY;  Surgeon: Lorretta Harp, MD;  Location: [place] CV LAB;  Service: Cardiovascular;;  . PERIPHERAL VASCULAR CATHETERIZATION Bilateral 06/07/2016   Procedure: Lower Extremity Angiography;  Surgeon: Lorretta Harp, MD;  Location: [place] CV LAB;  Service: Cardiovascular;  Laterality: Bilateral;  . PERIPHERAL VASCULAR CATHETERIZATION Left 06/07/2016   Procedure: Peripheral Vascular Atherectomy;  Surgeon: Lorretta Harp, MD;  Location: [place] CV LAB;  Service: Cardiovascular;  Laterality: Left;  SFA  . PERIPHERAL VASCULAR CATHETERIZATION Left 06/07/2016   Procedure: Peripheral Vascular Balloon Angioplasty;  Surgeon: Lorretta Harp, MD;  Location: [place] CV LAB;  Service: Cardiovascular;  Laterality: Left;  SFA  . THYROIDECTOMY  1995  . TONSILLECTOMY    . TOTAL KNEE  ARTHROPLASTY Right 06/18/2014   Procedure: RIGHT TOTAL KNEE ARTHROPLASTY;  Surgeon: Tobi Bastos, MD;  Location: [facility];  Service: Orthopedics;  Laterality: Right;    OB History   None      Home Medications    Prior to Admission medications   Medication Sig Start Date End Date Taking? Authorizing Provider  amLODipine (NORVASC) 10 MG tablet TAKE ONE TABLET BY MOUTH ONCE DAILY 08/19/16   Biagio Borg, MD  aspirin EC 81 MG tablet Take 81 mg by mouth daily.    [provider]  Calcium Carb-Cholecalciferol (CALCIUM 1000 + D PO) Take 1 tablet by mouth every other day.     [provider]  clopidogrel (PLAVIX) 75 MG tablet Take 1 tablet (75 mg total) by mouth daily with breakfast. 02/07/18   Bhagat, Bhavinkumar, PA  co-enzyme Q-10 30 MG capsule Take 30 mg by mouth every other day.     [provider]  cyclobenzaprine (FLEXERIL) 5  MG tablet Take 1 tablet (5 mg total) by mouth at bedtime for 7 days. 05/14/18 05/21/18  Wieters, Hallie C, PA-C  levothyroxine (SYNTHROID, LEVOTHROID) 150 MCG tablet Take 150 mcg by mouth daily before breakfast.    [provider]  Multiple Vitamin (MULTIVITAMIN WITH MINERALS) TABS tablet Take 1 tablet by mouth every morning. Centrum    [provider]  naproxen (NAPROSYN) 375 MG tablet Take 1 tablet (375 mg total) by mouth 2 (two) times daily. 05/14/18   Wieters, Hallie C, PA-C  pantoprazole (PROTONIX) 40 MG tablet Take 1 tablet (40 mg total) by mouth daily. 02/07/18   Bhagat, Crista Luria, PA  Propylene Glycol (SYSTANE COMPLETE) 0.6 % SOLN Place 1 drop into both eyes every morning.    [provider]  rosuvastatin (CRESTOR) 40 MG tablet Take 1 tablet (40 mg total) by mouth at bedtime. 03/06/18 03/06/19  Lelon Perla, MD  tiZANidine (ZANAFLEX) 4 MG tablet Take 1 tablet (4 mg total) by mouth at bedtime. Patient taking differently: Take 4 mg by mouth at bedtime as needed for muscle spasms.  09/24/15   Rubbie Battiest,  RN    Family History Family History  Problem Relation Age of Onset  . Arthritis Maternal Aunt   . Heart disease Maternal Aunt   . Hypertension Maternal Aunt   . Diabetes Maternal Aunt   . Colon cancer Neg Hx   . Kidney disease Neg Hx   . Liver disease Neg Hx   . Throat cancer Neg Hx   . Stomach cancer Neg Hx     Social History Social History   Tobacco Use  . Smoking status: Former Smoker    Packs/day: 0.80    Years: 45.00    Pack years: 36.00    Types: Cigarettes    Last attempt to quit: 08/23/2009    Years since quitting: 8.7  . Smokeless tobacco: Never Used  Substance Use Topics  . Alcohol use: Yes    Alcohol/week: 2.0 standard drinks    Types: 2 Glasses of wine per week  . Drug use: Never     Allergies   Atorvastatin   Review of Systems Review of Systems  Constitutional: Negative for fatigue and fever.  HENT: Negative for congestion, sinus pressure and sore throat.   Eyes: Negative for photophobia, pain and visual disturbance.  Respiratory: Negative for cough and shortness of breath.   Cardiovascular: Positive for chest pain.  Gastrointestinal: Positive for abdominal pain. Negative for nausea and vomiting.  Genitourinary: Positive for flank pain. Negative for decreased urine volume and hematuria.  Musculoskeletal: Positive for back pain and myalgias. Negative for neck pain and neck stiffness.  Neurological: Positive for headaches. Negative for dizziness, syncope, facial asymmetry, speech difficulty, weakness, light-headedness and numbness.     Physical Exam Triage Vital Signs ED Triage Vitals [05/14/18 1016]  Enc Vitals Group     BP 115/70     Pulse Rate 80     Resp 20     Temp 97.9 F (36.6 C)     Temp Source Oral     SpO2 97 %     Weight      Height      Head Circumference      Peak Flow      Pain Score      Pain Loc      Pain Edu?      Excl. in [place]?    No data found.  Updated Vital Signs BP 115/70 (  BP Location: Right Arm)   Pulse 80    Temp 97.9 F (36.6 C) (Oral)   Resp 20   SpO2 97%   Visual Acuity Right Eye Distance:   Left Eye Distance:   Bilateral Distance:    Right Eye Near:   Left Eye Near:    Bilateral Near:     Physical Exam  Constitutional: She appears well-developed and well-nourished. No distress.  HENT:  Head: Normocephalic and atraumatic.  Mouth/Throat: Oropharynx is clear and moist.  Bilateral ears without tenderness to palpation of external auricle, tragus and mastoid, EAC's without erythema or swelling, TM's with good bony landmarks and cone of light. Non erythematous.  Oral mucosa pink and moist, no tonsillar enlargement or exudate. Posterior pharynx patent and nonerythematous, no uvula deviation or swelling. Normal phonation.  Eyes: Conjunctivae are normal.  Neck: Neck supple.  Cardiovascular: Normal rate and regular rhythm.  No murmur heard. Pulmonary/Chest: Effort normal and breath sounds normal. No respiratory distress.  Breathing comfortably at rest, CTABL, no wheezing, rales or other adventitious sounds auscultated  Nontender to palpation of anterior chest, mild tenderness near the inferior sternal area centrally.  Abdominal: Soft. There is tenderness.  Patient has some mild tenderness throughout entire abdomen, no focal tenderness, negative rebound  Musculoskeletal: She exhibits no edema.  Nontender to palpation of cervical, lumbar spine, patient did have some mild tenderness to the lower thoracic spine midline as well as tenderness to right thoracic musculature around scapula  Neurological: She is alert.  Skin: Skin is warm and dry.  Psychiatric: She has a normal mood and affect.  Nursing note and vitals reviewed.    UC Treatments / Results  Labs (all labs ordered are listed, but only abnormal results are displayed) Labs Reviewed - No data to display  EKG None  Radiology Dg Chest 2 View  Result Date: 05/14/2018 CLINICAL DATA:  Hervey Ard central chest pain radiating into the  right back. Cough. EXAM: CHEST - 2 VIEW COMPARISON:  CT 11/08/2017.  Radiographs 08/06/2016. FINDINGS: The heart size and mediastinal contours are stable status post median sternotomy and CABG. There is aortic atherosclerosis. There is chronic lung disease with diffuse interstitial prominence and biapical pleuroparenchymal scarring. No edema, confluent airspace opacity, pleural effusion or pneumothorax demonstrated. There is a convex left thoracolumbar scoliosis. Surgical clips are present at the thoracic inlet. IMPRESSION: Stable chest without evidence of active cardiopulmonary process. Stable chronic lung disease with emphysema and biapical scarring. Aortic atherosclerosis. Electronically Signed   By: Richardean Sale M.D.   On: 05/14/2018 11:18    Procedures Procedures (including critical care time)  Medications Ordered in UC Medications - No data to display  Initial Impression / Assessment and Plan / UC Course  I have reviewed the triage vital signs and the nursing notes.  Pertinent labs & imaging results that were available during my care of the patient were reviewed by me and considered in my medical decision making (see chart for details).     EKG normal sinus rhythm, no acute signs of ischemia or infarction, similar to previous EKG in March.  Chest x-ray stable from previous EKG.  Patient does have emphysematous changes, but stable.  Pain likely musculoskeletal given reproducibility on exam, will treat with anti-inflammatories, muscle relaxer at bedtime.  Discussed sedation regarding Flexeril.  Provided 5 mg given patient elderly.  Continue to monitor symptoms, follow-up if discomfort worsening, developing new symptoms.  Persisting.  Discussed with patient possible gallbladder etiology given radiation to shoulder, but does  not have any nausea, vomiting or relation to eating.  Patient also has had some facial itching, but does not appear jaundiced, discussed trying blood work to check liver  function, patient declined and states that she would follow-up with her PCP.Discussed strict return precautions. Patient verbalized understanding and is agreeable with plan.  Final Clinical Impressions(s) / UC Diagnoses   Final diagnoses:  Acute right-sided thoracic back pain  Chest wall pain     Discharge Instructions     Your EKG was normal today Chest x-ray normal  We will treat this as muscle discomfort, please take Naprosyn twice daily Use muscle relaxer/Flexeril at bedtime, this will cause some sleepiness, do not use during the day  Please follow-up with your primary care if having persistent discomfort or localizing more to the right upper part of your abdomen Please return here or emergency room if developing worsening pain, chest pain changing, persisting, having associated nausea or vomiting, sweating, dizziness, lightheadedness    ED Prescriptions    Medication Sig Dispense Auth. Provider   cyclobenzaprine (FLEXERIL) 5 MG tablet Take 1 tablet (5 mg total) by mouth at bedtime for 7 days. 7 tablet Wieters, Hallie C, PA-C   naproxen (NAPROSYN) 375 MG tablet Take 1 tablet (375 mg total) by mouth 2 (two) times daily. 20 tablet Wieters, Felton C, PA-C     Controlled Substance Prescriptions [place] Controlled Substance Registry consulted? Not Applicable   Janith Lima, [place] 05/14/18 1137

## 2018-05-14 NOTE — Discharge Instructions (Signed)
Your EKG was normal today Chest x-ray normal  We will treat this as muscle discomfort, please take Naprosyn twice daily Use muscle relaxer/Flexeril at bedtime, this will cause some sleepiness, do not use during the day  Please follow-up with your primary care if having persistent discomfort or localizing more to the right upper part of your abdomen Please return here or emergency room if developing worsening pain, chest pain changing, persisting, having associated nausea or vomiting, sweating, dizziness, lightheadedness

## 2018-05-23 ENCOUNTER — Encounter: Payer: Self-pay | Admitting: Sports Medicine

## 2018-05-23 ENCOUNTER — Ambulatory Visit: Payer: Medicare HMO | Admitting: Sports Medicine

## 2018-05-23 DIAGNOSIS — M79676 Pain in unspecified toe(s): Secondary | ICD-10-CM

## 2018-05-23 DIAGNOSIS — M79672 Pain in left foot: Secondary | ICD-10-CM

## 2018-05-23 DIAGNOSIS — B351 Tinea unguium: Secondary | ICD-10-CM | POA: Diagnosis not present

## 2018-05-23 DIAGNOSIS — M21619 Bunion of unspecified foot: Secondary | ICD-10-CM

## 2018-05-23 DIAGNOSIS — M79671 Pain in right foot: Secondary | ICD-10-CM

## 2018-05-23 DIAGNOSIS — E119 Type 2 diabetes mellitus without complications: Secondary | ICD-10-CM

## 2018-05-23 NOTE — Progress Notes (Signed)
Patient ID: [name], female   DOB: [birth year], 72 y.o.   MRN: [id number] Subjective: [name] is a 72 y.o. female patient with history of diabetes who presents to office today complaining of long, painful nails  while ambulating in shoes; unable to trim. Patient states that the glucose reading this morning was 103mg /dl. Patient admits denies any new changes in medication or new problems.   Patient Active Problem List   Diagnosis Date Noted  . Claudication in peripheral vascular disease ([place]) 02/06/2018  . CAD in native artery 06/08/2016  . Skin inflammation 04/06/2016  . Contracture of hand joint 04/06/2016  . Microhematuria 11/12/2015  . Left shoulder pain 10/15/2015  . Trapezius muscle spasm 09/24/2015  . Type 2 diabetes mellitus ([place]) 08/07/2015  . Urinary frequency 04/04/2015  . External hemorrhoid 11/02/2014  . Left-sided thoracic back pain 10/03/2014  . Gingivitis 10/03/2014  . Routine general medical examination at a health care facility 09/10/2014  . Total knee replacement status 06/18/2014  . Tooth pain 04/18/2014  . Claudication ([place]) 03/21/2014  . Primary localized osteoarthrosis, lower leg 08/10/2013  . Right knee pain 08/02/2013  . Upper respiratory infection 08/02/2013  . Dyspepsia 11/16/2012  . Knee pain, right 02/18/2012  . PAD (peripheral artery disease) ([place]) 12/03/2011  . PVD (peripheral vascular disease) ([place]) 10/31/2011  . RSD (reflex sympathetic dystrophy) 10/31/2011  . Vitamin D deficiency   . Preop exam for internal medicine 10/28/2011  . Anxiety 03/11/2011  . COPD (chronic obstructive pulmonary disease) ([place]) 02/02/2011  . Anemia 02/02/2011  . History of rheumatic fever 02/02/2011  . Insomnia 02/02/2011  . Coronary atherosclerosis 09/08/2010  . CONSTIPATION 02/19/2010  . TOBACCO ABUSE 07/21/2009  . MURMUR 06/17/2009  . [place] DISEASE, CERVICAL 03/20/2009  . Pain in Soft Tissues of Limb 05/23/2008  . Hypothyroidism 09/07/2007  .  Hyperlipidemia LDL goal <70 09/07/2007  . Essential hypertension 09/07/2007  . ALLERGIC RHINITIS 09/07/2007  . GERD 09/07/2007  . Primary osteoarthritis of right knee 09/07/2007  . Osteopenia 09/07/2007  . COLONIC POLYPS, HX OF 09/07/2007   Current Outpatient Medications on File Prior to Visit  Medication Sig Dispense Refill  . amLODipine (NORVASC) 10 MG tablet TAKE ONE TABLET BY MOUTH ONCE DAILY 90 tablet 1  . aspirin EC 81 MG tablet Take 81 mg by mouth daily.    . Calcium Carb-Cholecalciferol (CALCIUM 1000 + D PO) Take 1 tablet by mouth every other day.     . clopidogrel (PLAVIX) 75 MG tablet Take 1 tablet (75 mg total) by mouth daily with breakfast. 90 tablet 3  . co-enzyme Q-10 30 MG capsule Take 30 mg by mouth every other day.     . levothyroxine (SYNTHROID, LEVOTHROID) 150 MCG tablet Take 150 mcg by mouth daily before breakfast.    . Multiple Vitamin (MULTIVITAMIN WITH MINERALS) TABS tablet Take 1 tablet by mouth every morning. Centrum    . naproxen (NAPROSYN) 375 MG tablet Take 1 tablet (375 mg total) by mouth 2 (two) times daily. 20 tablet 0  . pantoprazole (PROTONIX) 40 MG tablet Take 1 tablet (40 mg total) by mouth daily. 30 tablet 6  . Propylene Glycol (SYSTANE COMPLETE) 0.6 % SOLN Place 1 drop into both eyes every morning.    . rosuvastatin (CRESTOR) 40 MG tablet Take 1 tablet (40 mg total) by mouth at bedtime. 90 tablet 3  . tiZANidine (ZANAFLEX) 4 MG tablet Take 1 tablet (4 mg total) by mouth at bedtime. (Patient taking differently: Take 4  mg by mouth at bedtime as needed for muscle spasms. ) 30 tablet 0   Current Facility-Administered Medications on File Prior to Visit  Medication Dose Route Frequency Provider Last Rate Last Dose  . tranexamic acid (CYKLOKAPRON) 2,000 mg in sodium chloride 0.9 % 50 mL Topical Application  0,093 mg Topical Once Cecilio Asper, [employer], PA-C       Allergies  Allergen Reactions  . Atorvastatin Other (See Comments)    Muscle aches Other  reaction(s): Other (See Comments) Muscle aches    Recent Results (from the past 2160 hour(s))  Lipid panel     Status: None   Collection Time: 03/09/18  9:08 AM  Result Value Ref Range   Cholesterol, Total 112 100 - 199 mg/dL   Triglycerides 72 0 - 149 mg/dL   HDL 52 >39 mg/dL   VLDL Cholesterol Cal 14 5 - 40 mg/dL   LDL Calculated 46 0 - 99 mg/dL   Chol/HDL Ratio 2.2 0.0 - 4.4 ratio    Comment:                                   T. Chol/HDL Ratio                                             Men  Women                               1/2 Avg.Risk  3.4    3.3                                   Avg.Risk  5.0    4.4                                2X Avg.Risk  9.6    7.1                                3X Avg.Risk 23.4   11.0   Hepatic function panel     Status: None   Collection Time: 03/09/18  9:08 AM  Result Value Ref Range   Total Protein 6.7 6.0 - 8.5 g/dL   Albumin 4.3 3.5 - 4.8 g/dL   Bilirubin Total 0.3 0.0 - 1.2 mg/dL   Bilirubin, Direct 0.12 0.00 - 0.40 mg/dL   Alkaline Phosphatase 96 39 - 117 IU/L   AST 14 0 - 40 IU/L   ALT 12 0 - 32 IU/L    Objective: General: Patient is awake, alert, and oriented x 3 and in no acute distress.  Integument: Skin is warm, dry and supple bilateral. Nails are tender, long, thickened and dystrophic with subungual debris, consistent with onychomycosis, 1-5 bilateral. No signs of infection. No open lesions or preulcerative lesions present bilateral. Remaining integument unremarkable.  Vasculature:  Dorsalis Pedis pulse 1/4 bilateral. Posterior Tibial pulse  1/4 bilateral. Capillary fill time <3 sec 1-5 bilateral. Positive hair growth to the level of the digits.Temperature gradient within normal limits. No varicosities present bilateral. No edema present bilateral.   Neurology: The  patient has intact sensation measured with a 5.07/10g Semmes Weinstein Monofilament at all pedal sites bilateral . Vibratory sensation intact bilateral with tuning fork.  No Babinski sign present bilateral.   Musculoskeletal: Asymptomatic bunion and hammertoe gross pedal deformities noted bilateral, L>R. Muscular strength 5/5 in all lower extremity muscular groups bilateral without pain on range of motion . No tenderness with calf compression bilateral.  Assessment and Plan: Problem List Items Addressed This Visit    None    Visit Diagnoses    Pain due to onychomycosis of toenail    -  Primary   Diabetes mellitus without complication (HCC)       Bunion       Foot pain, bilateral         -Examined patient. -Discussed and educated patient on diabetic foot care, especially with  regards to the vascular, neurological and musculoskeletal systems.  -Mechanically debrided all nails 1-5 bilateral using sterile nail nipper and filed with dremel without incident  -Recommend continue with good supportive shoes daily for foot type and continue with bunion padding for left [employer] diabetic shoe order form was completed; office to contact primary care for approval / certification;  Office to arrange shoe fitting and dispensing -Patient to return in 3 months for at risk foot care -Patient advised to call the office if any problems or questions arise in the meantime.  Landis Martins, DPM

## 2018-05-24 ENCOUNTER — Other Ambulatory Visit: Payer: Medicare HMO | Admitting: Orthotics

## 2018-05-30 ENCOUNTER — Telehealth: Payer: Self-pay | Admitting: Cardiology

## 2018-05-30 NOTE — Telephone Encounter (Signed)
New Message         Patient is calling today to ask if it's safe to take alka-selker for a cold? Pls call and advise

## 2018-06-15 ENCOUNTER — Ambulatory Visit: Payer: Medicare HMO | Admitting: Orthotics

## 2018-06-20 ENCOUNTER — Ambulatory Visit (INDEPENDENT_AMBULATORY_CARE_PROVIDER_SITE_OTHER): Payer: Medicare HMO | Admitting: Orthotics

## 2018-06-20 DIAGNOSIS — E119 Type 2 diabetes mellitus without complications: Secondary | ICD-10-CM

## 2018-06-20 DIAGNOSIS — B351 Tinea unguium: Secondary | ICD-10-CM | POA: Diagnosis not present

## 2018-06-20 DIAGNOSIS — M79676 Pain in unspecified toe(s): Secondary | ICD-10-CM

## 2018-06-20 DIAGNOSIS — M21619 Bunion of unspecified foot: Secondary | ICD-10-CM

## 2018-06-20 NOTE — Progress Notes (Signed)

## 2018-08-29 ENCOUNTER — Ambulatory Visit: Payer: Medicare Other | Admitting: Podiatry

## 2018-08-29 ENCOUNTER — Ambulatory Visit: Payer: Medicare HMO | Admitting: Sports Medicine

## 2018-08-29 DIAGNOSIS — M79676 Pain in unspecified toe(s): Secondary | ICD-10-CM | POA: Diagnosis not present

## 2018-08-29 DIAGNOSIS — B351 Tinea unguium: Secondary | ICD-10-CM

## 2018-08-29 NOTE — Patient Instructions (Addendum)
Diabetes Mellitus and Foot Care Foot care is an important part of your health, especially when you have diabetes. Diabetes may cause you to have problems because of poor blood flow (circulation) to your feet and legs, which can cause your skin to:  Become thinner and drier.  Break more easily.  Heal more slowly.  Peel and crack. You may also have nerve damage (neuropathy) in your legs and feet, causing decreased feeling in them. This means that you may not notice minor injuries to your feet that could lead to more serious problems. Noticing and addressing any potential problems early is the best way to prevent future foot problems. How to care for your feet Foot hygiene  Wash your feet daily with warm water and mild soap. Do not use hot water. Then, pat your feet and the areas between your toes until they are completely dry. Do not soak your feet as this can dry your skin.  Trim your toenails straight across. Do not dig under them or around the cuticle. File the edges of your nails with an emery board or nail file.  Apply a moisturizing lotion or petroleum jelly to the skin on your feet and to dry, brittle toenails. Use lotion that does not contain alcohol and is unscented. Do not apply lotion between your toes. Shoes and socks  Wear clean socks or stockings every day. Make sure they are not too tight. Do not wear knee-high stockings since they may decrease blood flow to your legs.  Wear shoes that fit properly and have enough cushioning. Always look in your shoes before you put them on to be sure there are no objects inside.  To break in new shoes, wear them for just a few hours a day. This prevents injuries on your feet. Wounds, scrapes, corns, and calluses  Check your feet daily for blisters, cuts, bruises, sores, and redness. If you cannot see the bottom of your feet, use a mirror or ask someone for help.  Do not cut corns or calluses or try to remove them with medicine.  If you  find a minor scrape, cut, or break in the skin on your feet, keep it and the skin around it clean and dry. You may clean these areas with mild soap and water. Do not clean the area with peroxide, alcohol, or iodine.  If you have a wound, scrape, corn, or callus on your foot, look at it several times a day to make sure it is healing and not infected. Check for: ? Redness, swelling, or pain. ? Fluid or blood. ? Warmth. ? Pus or a bad smell. General instructions  Do not cross your legs. This may decrease blood flow to your feet.  Do not use heating pads or hot water bottles on your feet. They may burn your skin. If you have lost feeling in your feet or legs, you may not know this is happening until it is too late.  Protect your feet from hot and cold by wearing shoes, such as at the beach or on hot pavement.  Schedule a complete foot exam at least once a year (annually) or more often if you have foot problems. If you have foot problems, report any cuts, sores, or bruises to your health care provider immediately. Contact a health care provider if:  You have a medical condition that increases your risk of infection and you have any cuts, sores, or bruises on your feet.  You have an injury that is not   healing.  You have redness on your legs or feet.  You feel burning or tingling in your legs or feet.  You have pain or cramps in your legs and feet.  Your legs or feet are numb.  Your feet always feel cold.  You have pain around a toenail. Get help right away if:  You have a wound, scrape, corn, or callus on your foot and: ? You have pain, swelling, or redness that gets worse. ? You have fluid or blood coming from the wound, scrape, corn, or callus. ? Your wound, scrape, corn, or callus feels warm to the touch. ? You have pus or a bad smell coming from the wound, scrape, corn, or callus. ? You have a fever. ? You have a red line going up your leg. Summary  Check your feet every day  for cuts, sores, red spots, swelling, and blisters.  Moisturize feet and legs daily.  Wear shoes that fit properly and have enough cushioning.  If you have foot problems, report any cuts, sores, or bruises to your health care provider immediately.  Schedule a complete foot exam at least once a year (annually) or more often if you have foot problems. This information is not intended to replace advice given to you by your health care provider. Make sure you discuss any questions you have with your health care provider. Document Released: 08/06/2000 Document Revised: 09/21/2017 Document Reviewed: 09/10/2016 Elsevier Interactive Patient Education  2019 Elsevier Inc. Onychomycosis/Fungal Toenails  WHAT IS IT? An infection that lies within the keratin of your nail plate that is caused by a fungus.  WHY ME? Fungal infections affect all ages, sexes, races, and creeds.  There may be many factors that predispose you to a fungal infection such as age, coexisting medical conditions such as diabetes, or an autoimmune disease; stress, medications, fatigue, genetics, etc.  Bottom line: fungus thrives in a warm, moist environment and your shoes offer such a location.  IS IT CONTAGIOUS? Theoretically, yes.  You do not want to share shoes, nail clippers or files with someone who has fungal toenails.  Walking around barefoot in the same room or sleeping in the same bed is unlikely to transfer the organism.  It is important to realize, however, that fungus can spread easily from one nail to the next on the same foot.  HOW DO WE TREAT THIS?  There are several ways to treat this condition.  Treatment may depend on many factors such as age, medications, pregnancy, liver and kidney conditions, etc.  It is best to ask your doctor which options are available to you.  1. No treatment.   Unlike many other medical concerns, you can live with this condition.  However for many people this can be a painful condition and may  lead to ingrown toenails or a bacterial infection.  It is recommended that you keep the nails cut short to help reduce the amount of fungal nail. 2. Topical treatment.  These range from herbal remedies to prescription strength nail lacquers.  About 40-50% effective, topicals require twice daily application for approximately 9 to 12 months or until an entirely new nail has grown out.  The most effective topicals are medical grade medications available through physicians offices. 3. Oral antifungal medications.  With an 80-90% cure rate, the most common oral medication requires 3 to 4 months of therapy and stays in your system for a year as the new nail grows out.  Oral antifungal medications do require blood  work to make sure it is a safe drug for you.  A liver function panel will be performed prior to starting the medication and after the first month of treatment.  It is important to have the blood work performed to avoid any harmful side effects.  In general, this medication safe but blood work is required. 4. Laser Therapy.  This treatment is performed by applying a specialized laser to the affected nail plate.  This therapy is noninvasive, fast, and non-painful.  It is not covered by insurance and is therefore, out of pocket.  The results have been very good with a 80-95% cure rate.  [facility] is the only practice in the area to offer this therapy. 5. Permanent Nail Avulsion.  Removing the entire nail so that a new nail will not grow back.  Corns and Calluses Corns are small areas of thickened skin that occur on the top, sides, or tip of a toe. They contain a cone-shaped core with a point that can press on a nerve below. This causes pain.  Calluses are areas of thickened skin that can occur anywhere on the body, including the hands, fingers, palms, soles of the feet, and heels. Calluses are usually larger than corns. What are the causes? Corns and calluses are caused by rubbing (friction) or  pressure, such as from shoes that are too tight or do not fit properly. What increases the risk? Corns are more likely to develop in people who have misshapen toes (toe deformities), such as hammer toes. Calluses can occur with friction to any area of the skin. They are more likely to develop in people who:  Work with their hands.  Wear shoes that fit poorly, are too tight, or are high-heeled.  Have toe deformities. What are the signs or symptoms? Symptoms of a corn or callus include:  A hard growth on the skin.  Pain or tenderness under the skin.  Redness and swelling.  Increased discomfort while wearing tight-fitting shoes, if your feet are affected. If a corn or callus becomes infected, symptoms may include:  Redness and swelling that gets worse.  Pain.  Fluid, blood, or pus draining from the corn or callus. How is this diagnosed? Corns and calluses may be diagnosed based on your symptoms, your medical history, and a physical exam. How is this treated? Treatment for corns and calluses may include:  Removing the cause of the friction or pressure. This may involve: ? Changing your shoes. ? Wearing shoe inserts (orthotics) or other protective layers in your shoes, such as a corn pad. ? Wearing gloves.  Applying medicine to the skin (topical medicine) to help soften skin in the hardened, thickened areas.  Removing layers of dead skin with a file to reduce the size of the corn or callus.  Removing the corn or callus with a scalpel or laser.  Taking antibiotic medicines, if your corn or callus is infected.  Having surgery, if a toe deformity is the cause. Follow these instructions at home:   Take over-the-counter and prescription medicines only as told by your health care provider.  If you were prescribed an antibiotic, take it as told by your health care provider. Do not stop taking it even if your condition starts to improve.  Wear shoes that fit well. Avoid  wearing high-heeled shoes and shoes that are too tight or too loose.  Wear any padding, protective layers, gloves, or orthotics as told by your health care provider.  Soak your  hands or feet and then use a file or pumice stone to soften your corn or callus. Do this as told by your health care provider.  Check your corn or callus every day for symptoms of infection. Contact a health care provider if you:  Notice that your symptoms do not improve with treatment.  Have redness or swelling that gets worse.  Notice that your corn or callus becomes painful.  Have fluid, blood, or pus coming from your corn or callus.  Have new symptoms. Summary  Corns are small areas of thickened skin that occur on the top, sides, or tip of a toe.  Calluses are areas of thickened skin that can occur anywhere on the body, including the hands, fingers, palms, and soles of the feet. Calluses are usually larger than corns.  Corns and calluses are caused by rubbing (friction) or pressure, such as from shoes that are too tight or do not fit properly.  Treatment may include wearing any padding, protective layers, gloves, or orthotics as told by your health care provider. This information is not intended to replace advice given to you by your health care provider. Make sure you discuss any questions you have with your health care provider. Document Released: 05/15/2004 Document Revised: 06/22/2017 Document Reviewed: 06/22/2017 Elsevier Interactive Patient Education  2019 Elsevier Inc.  

## 2018-09-05 ENCOUNTER — Telehealth: Payer: Self-pay | Admitting: Gastroenterology

## 2018-09-05 NOTE — Telephone Encounter (Signed)
i'm happy to see her back.  She needs OV before scheduling any procedures.  thanks

## 2018-09-05 NOTE — Telephone Encounter (Signed)
Hi Dr. Ardis Hughs, this pt called to schedule her colonoscopy with you but she unknowingly transferred care over to Dr. Earlean Shawl, she stated that she saw him once because his office was close to her house but she is not planning on seeing him again. Will you take patient back? Thank you.

## 2018-09-06 ENCOUNTER — Telehealth (HOSPITAL_COMMUNITY): Payer: Self-pay

## 2018-09-06 NOTE — Telephone Encounter (Signed)
Pt returned call stating that she called her insurance and was informed that we are not in network so she will have to look for another gi md.

## 2018-09-06 NOTE — Telephone Encounter (Signed)
Left message to call back to schedule ov. Pt on BT.

## 2018-09-08 NOTE — Telephone Encounter (Signed)
Pt called again stating that Dr. Ardis Hughs is in network with her insurance and wanted to sch appt. Appt scheduled for 10/11/18 at 9:00am.

## 2018-09-14 ENCOUNTER — Encounter (HOSPITAL_COMMUNITY): Payer: Medicare HMO

## 2018-09-18 ENCOUNTER — Encounter: Payer: Self-pay | Admitting: Podiatry

## 2018-09-18 NOTE — Progress Notes (Signed)
Subjective: [name] presents today with painful, thick toenails 1-5 b/l that she cannot cut and which interfere with daily activities.  Pain is aggravated when wearing enclosed shoe gear.  Reynold Bowen, MD    Allergies  Allergen Reactions  . Atorvastatin Other (See Comments)    Muscle aches Other reaction(s): Other (See Comments) Muscle aches    Objective:  Vascular Examination: Capillary refill time immediate x 10 digits  Dorsalis pedis 1/4 b/l   Posterior tibial pulses 1/4 b/l  Digital hair present x 10 digits  Skin temperature gradient WNL b/l  Dermatological Examination: Skin with normal turgor, texture and tone b/l  Toenails 1-5 b/l discolored, thick, dystrophic with subungual debris and pain with palpation to nailbeds due to thickness of nails.  Musculoskeletal: Muscle strength 5/5 to all LE muscle groups  HAV with bunion b/l  Hammertoes b/l  No pain, crepitus or joint limitation noted with ROM.   Neurological: Sensation intact with 10 gram monofilament. Vibratory sensation intact.  Assessment: Painful onychomycosis toenails 1-5 b/l   Plan: 1. Toenails 1-5 b/l were debrided in length and girth without iatrogenic bleeding. 2. Patient to continue soft, supportive shoe gear 3. Patient to report any pedal injuries to medical professional immediately. 4. Follow up 3 months. Patient/POA to call should there be a concern in the interim.

## 2018-09-20 ENCOUNTER — Other Ambulatory Visit: Payer: Self-pay | Admitting: Thoracic Surgery (Cardiothoracic Vascular Surgery)

## 2018-09-20 DIAGNOSIS — R918 Other nonspecific abnormal finding of lung field: Secondary | ICD-10-CM

## 2018-09-20 DIAGNOSIS — R911 Solitary pulmonary nodule: Secondary | ICD-10-CM

## 2018-09-20 DIAGNOSIS — Z720 Tobacco use: Secondary | ICD-10-CM

## 2018-10-04 ENCOUNTER — Other Ambulatory Visit: Payer: Self-pay | Admitting: Internal Medicine

## 2018-10-04 DIAGNOSIS — M810 Age-related osteoporosis without current pathological fracture: Secondary | ICD-10-CM

## 2018-10-11 ENCOUNTER — Ambulatory Visit: Payer: Medicare Other | Admitting: Gastroenterology

## 2018-10-11 ENCOUNTER — Encounter: Payer: Self-pay | Admitting: Gastroenterology

## 2018-10-11 ENCOUNTER — Telehealth: Payer: Self-pay

## 2018-10-11 VITALS — BP 128/76 | HR 68 | Ht 63.0 in | Wt 134.0 lb

## 2018-10-11 DIAGNOSIS — K5900 Constipation, unspecified: Secondary | ICD-10-CM | POA: Diagnosis not present

## 2018-10-11 DIAGNOSIS — Z1211 Encounter for screening for malignant neoplasm of colon: Secondary | ICD-10-CM | POA: Diagnosis not present

## 2018-10-11 MED ORDER — PEG 3350-KCL-NA BICARB-NACL 420 G PO SOLR: 4000.0000 mL | ORAL | 0 refills | Status: DC

## 2018-10-11 NOTE — Telephone Encounter (Signed)
[facility] Pre-operative Risk Assessment     Request for surgical clearance:     Endoscopy Procedure  What type of surgery is being performed?     colonoscopy  When is this surgery scheduled?     11/13/18  What type of clearance is required ?   Pharmacy  Are there any medications that need to be held prior to surgery and how long? Plavix  Practice name and name of physician performing surgery?      [facility]  What is your office phone and fax number?      Phone- [phone]  [phone]  Anesthesia type (None, local, MAC, general) ?       MAC

## 2018-10-11 NOTE — Patient Instructions (Addendum)
You will be set up for a colonoscopy for colon cancer screening.  Blood thinner plavix should be held for 5 days prior to the colonoscopy. We will communicate with your cardiologist about the safety of this recommendation.   Start drinking two doses of miralax every day. Also start taking citrucel every single day: (orange flavored) powder fiber supplement.  This may cause some bloating at first but that usually goes away. Begin with a small spoonful and work your way up to a large, heaping spoonful daily over a week  Thank you for entrusting me with your care and choosing [facility].  Dr Ardis Hughs .

## 2018-10-11 NOTE — Progress Notes (Signed)
Review of pertinent gastrointestinal problems:  1. routine risk for colon cancer, colonoscopy February 2009 , Dr. Ardis Hughs, left-sided diverticulosis without any polyps. Recall colonoscopy at 10 year interval 2. Mild gastritis, HH on EGD 01/2013 Ardis Hughs, done for thickened distal esophagus on CT. h pylori negative.   3.  Dysphasia, intermittent.  EGD December 2016 Dr. Ardis Hughs found 5 cm hiatal hernia with acid related esophagitis without strictures or stenosis.  Recommended twice daily proton pump inhibitor and that she chew her food well eat slowly and take small bites.   HPI: This is a very pleasant 73 year old woman who was referred to me by Reynold Bowen, MD  to evaluate chronic constipation.    Chief complaint is chronic constipation  Takes plavix for coronary artery disease.  She has been on it for many years, it sounds like since she had CABG many years ago.  Has chronic constipation.  Can be two weeks between bowel movements.  Causes abdominal pains low in her abdomen, these only improved when she finally moves her bowels.  Takes prune juice daily.  She has tried fiber supplements.  She has tried Linzess.  She has tried MiraLAX.  No overt bleeding in her stools.  Overall weight increasing: up 20 pounds in past few years.   Review of systems: Pertinent positive and negative review of systems were noted in the above HPI section. All other review negative.   Past Medical History:  Diagnosis Date  . Anemia   . Anxiety    hx  . Arthritis    "my whole body"  . Atrial septal aneurysm   . COLONIC POLYPS, HX OF   . COPD (chronic obstructive pulmonary disease) ([place])    no per pt on 03/21/2014 & 02/06/2018  . Coronary artery disease    a. s/p CABG 2012.  Marland Kitchen DISC DISEASE, CERVICAL   . Gastroesophageal reflux disease   . Headache    "I have headaches all the time" (02/06/2018)  . Heart murmur    as a child  . History of rheumatic fever   . Hyperlipidemia   . Hypertension   .  Hypothyroidism   . Insomnia   . Osteoarthritis   . OSTEOPENIA   . PAD (peripheral artery disease) ([place])    a. PTA to R SFA 2015. b. PTA to L SFA 05/2016.  . Pre-diabetes   . RSD (reflex sympathetic dystrophy) 10/31/2011  . Vitamin D deficiency     Past Surgical History:  Procedure Laterality Date  . ABDOMINAL HYSTERECTOMY  1977   "partial"  . BALLOON ANGIOPLASTY, ARTERY Right 03/21/2014   SFA  . CARDIAC CATHETERIZATION  2011   . CARPAL TUNNEL RELEASE Right 2005  . CATARACT EXTRACTION Left 2013  . CORONARY ARTERY BYPASS GRAFT  10/2009   LIMA to the LAD, saphenous vein graft to the acute marginal, saphenous vein graft to the diagonal and obtuse marginal.  . DILATION AND CURETTAGE OF UTERUS  1978  . JOINT REPLACEMENT    . LOWER EXTREMITY ANGIOGRAM N/A 03/21/2014   Procedure: LOWER EXTREMITY ANGIOGRAM;  Surgeon: Lorretta Harp, MD;  Location: [facility] CATH LAB;  Service: Cardiovascular;  Laterality: N/A;  . LOWER EXTREMITY ANGIOGRAM  06/07/2016   Abdominal aortogram/bilateral iliac angiogram/bifemoral runoff  . LOWER EXTREMITY ANGIOGRAPHY N/A 02/06/2018   Procedure: LOWER EXTREMITY ANGIOGRAPHY;  Surgeon: Lorretta Harp, MD;  Location: [place] CV LAB;  Service: Cardiovascular;  Laterality: N/A;  . PERIPHERAL VASCULAR ATHERECTOMY  02/06/2018   Procedure: PERIPHERAL VASCULAR ATHERECTOMY;  Surgeon: Lorretta Harp, MD;  Location: [place] CV LAB;  Service: Cardiovascular;;  . PERIPHERAL VASCULAR CATHETERIZATION Bilateral 06/07/2016   Procedure: Lower Extremity Angiography;  Surgeon: Lorretta Harp, MD;  Location: [place] CV LAB;  Service: Cardiovascular;  Laterality: Bilateral;  . PERIPHERAL VASCULAR CATHETERIZATION Left 06/07/2016   Procedure: Peripheral Vascular Atherectomy;  Surgeon: Lorretta Harp, MD;  Location: [place] CV LAB;  Service: Cardiovascular;  Laterality: Left;  SFA  . PERIPHERAL VASCULAR CATHETERIZATION Left 06/07/2016   Procedure: Peripheral Vascular  Balloon Angioplasty;  Surgeon: Lorretta Harp, MD;  Location: [place] [facility];  Service: Cardiovascular;  Laterality: Left;  SFA  . THYROIDECTOMY  1995  . TONSILLECTOMY    . TOTAL KNEE ARTHROPLASTY Right 06/18/2014   Procedure: RIGHT TOTAL KNEE ARTHROPLASTY;  Surgeon: Tobi Bastos, MD;  Location: [facility] ORS;  Service: Orthopedics;  Laterality: Right;    Current Outpatient Medications  Medication Sig Dispense Refill  . alclomethasone (ACLOVATE) 0.05 % cream     . amLODipine (NORVASC) 10 MG tablet TAKE ONE TABLET BY MOUTH ONCE DAILY 90 tablet 1  . aspirin EC 81 MG tablet Take 81 mg by mouth daily.    . Calcium Carb-Cholecalciferol (CALCIUM 1000 + D PO) Take 1 tablet by mouth every other day.     . clopidogrel (PLAVIX) 75 MG tablet Take 1 tablet (75 mg total) by mouth daily with breakfast. 90 tablet 3  . co-enzyme Q-10 30 MG capsule Take 30 mg by mouth as needed.     Marland Kitchen levothyroxine (SYNTHROID, LEVOTHROID) 150 MCG tablet Take 150 mcg by mouth daily before breakfast.    . Multiple Vitamin (MULTIVITAMIN WITH MINERALS) TABS tablet Take 1 tablet by mouth every morning. Centrum    . pantoprazole (PROTONIX) 40 MG tablet Take 1 tablet (40 mg total) by mouth daily. 30 tablet 6  . Propylene Glycol (SYSTANE COMPLETE) 0.6 % SOLN Place 1 drop into both eyes every morning.    . rosuvastatin (CRESTOR) 40 MG tablet Take 1 tablet (40 mg total) by mouth at bedtime. 90 tablet 3   No current facility-administered medications for this visit.    Facility-Administered Medications Ordered in Other Visits  Medication Dose Route Frequency Provider Last Rate Last Dose  . tranexamic acid (CYKLOKAPRON) 2,000 mg in sodium chloride 0.9 % 50 mL Topical Application  1,884 mg Topical Once Constable, Amber, PA-C        Allergies as of 10/11/2018 - Review Complete 10/11/2018  Allergen Reaction Noted  . Atorvastatin Other (See Comments) 04/30/2014    Family History  Problem Relation Age of Onset  . Arthritis  Maternal Aunt   . Heart disease Maternal Aunt   . Hypertension Maternal Aunt   . Diabetes Maternal Aunt   . Colon cancer Neg Hx   . Kidney disease Neg Hx   . Liver disease Neg Hx   . Throat cancer Neg Hx   . Stomach cancer Neg Hx     Social History   Socioeconomic History  . Marital status: Divorced    Spouse name: Not on file  . Number of children: 2  . Years of education: 54  . Highest education level: Not on file  Occupational History  . Occupation: Retired    Fish farm manager: RETIRED  Social Needs  . Financial resource strain: Not on file  . Food insecurity:    Worry: Not on file    Inability: Not on file  . Transportation needs:    Medical:  Not on file    Non-medical: Not on file  Tobacco Use  . Smoking status: Former Smoker    Packs/day: 0.80    Years: 45.00    Pack years: 36.00    Types: Cigarettes    Last attempt to quit: 08/23/2009    Years since quitting: 9.1  . Smokeless tobacco: Never Used  Substance and Sexual Activity  . Alcohol use: Yes    Alcohol/week: 2.0 standard drinks    Types: 2 Glasses of wine per week  . Drug use: Never  . Sexual activity: Not Currently  Lifestyle  . Physical activity:    Days per week: Not on file    Minutes per session: Not on file  . Stress: Not on file  Relationships  . Social connections:    Talks on phone: Not on file    Gets together: Not on file    Attends religious service: Not on file    Active member of club or organization: Not on file    Attends meetings of clubs or organizations: Not on file    Relationship status: Not on file  . Intimate partner violence:    Fear of current or ex partner: Not on file    Emotionally abused: Not on file    Physically abused: Not on file    Forced sexual activity: Not on file  Other Topics Concern  . Not on file  Social History Narrative  . Not on file     Physical Exam: BP 128/76   Pulse 68   Ht 5\' 3"  (1.6 m)   Wt 134 lb (60.8 kg)   BMI 23.74 kg/m  Constitutional:  generally well-appearing Psychiatric: alert and oriented x3 Eyes: extraocular movements intact Mouth: oral pharynx moist, no lesions Neck: supple no lymphadenopathy Cardiovascular: heart regular rate and rhythm Lungs: clear to auscultation bilaterally Abdomen: soft, nontender, nondistended, no obvious ascites, no peritoneal signs, normal bowel sounds Extremities: no lower extremity edema bilaterally Skin: no lesions on visible extremities   Assessment and plan: 73 y.o. female with chronic constipation, routine risk for colon cancer  First I recommended that she try again with fiber supplements once daily and also add 2 doses of MiraLAX every day.  This is a start point, she understands that I might adjust these doses down or up or add another agent depending on her response.  I do not think that her chronic constipation is from anything ominous, she has no bleeding, her weight has been going up recently.  I recommended a colonoscopy for routine colon cancer screening.  Her last colon cancer screening was about 11 years ago.  I see no reason for any further blood tests or imaging studies prior to then.    Please see the "Patient Instructions" section for addition details about the plan.   Owens Loffler, MD [facility] 10/11/2018, 9:18 AM  Cc: Reynold Bowen, MD

## 2018-10-11 NOTE — Telephone Encounter (Signed)
   Primary Cardiologist: Kirk Ruths, MD  PV: Dr. Gwenlyn Found  Chart reviewed as part of pre-operative protocol coverage. Can patient hold Plavix for 5 days?  Last PV procedure 02/06/2018. CABG in 2012.  Please forward your response to P [facility] PREOP.   Thank you  Leanor Kail, PA 10/11/2018, 9:59 AM

## 2018-10-13 NOTE — Telephone Encounter (Signed)
   Primary Cardiologist: Kirk Ruths, MD  Chart reviewed as part of pre-operative protocol coverage.  [name] was last seen on 03/06/18 by Dr. Stanford Breed.  [name] has requested pharmacy clearance for antiplatelet medications, not medical clearance.   Per Dr. Gwenlyn Found, patient may hold antiplatelet therapy for colonoscopy.  I will route this recommendation to the requesting party via Epic fax function and remove from pre-op pool.  Please call with questions.  [place], PA 10/13/2018, 12:22 PM

## 2018-10-13 NOTE — Telephone Encounter (Signed)
Cardiology stated that they have faxed over clearance for pt.  CB # [phone]

## 2018-10-13 NOTE — Telephone Encounter (Signed)
Okay to hold antiplatelet therapy for colonoscopy.

## 2018-10-13 NOTE — Telephone Encounter (Signed)
LM2CB-[facility]

## 2018-10-13 NOTE — Telephone Encounter (Signed)
Pt informed of providers result & recommendations. Pt verbalized understanding. No further questions . ? ?

## 2018-10-16 NOTE — Telephone Encounter (Signed)
Spoke to patient. Informed her that Dr Stanford Breed cleared her to  Hold Plavix prior to her colonoscopy. Patient voiced understanding.

## 2018-10-24 ENCOUNTER — Encounter: Payer: Self-pay | Admitting: Advanced Practice Midwife

## 2018-10-24 ENCOUNTER — Ambulatory Visit: Payer: Medicare Other | Admitting: Advanced Practice Midwife

## 2018-10-24 VITALS — BP 130/74 | HR 84 | Resp 16 | Ht 63.0 in | Wt 133.8 lb

## 2018-10-24 DIAGNOSIS — N898 Other specified noninflammatory disorders of vagina: Secondary | ICD-10-CM | POA: Insufficient documentation

## 2018-10-24 DIAGNOSIS — Z90711 Acquired absence of uterus with remaining cervical stump: Secondary | ICD-10-CM | POA: Insufficient documentation

## 2018-10-24 NOTE — Progress Notes (Signed)
[name] is a  73 y.o. african-american women referred to our office from her PCP for intermittent brown vaginal discharge.  Pap in their office was normal. She has colonoscopy scheduled with [facility]. The pt is pleasant and denies any discharge at present. She does express concerns at being referred from doctor to doctor and not getting answers. I explained that I am a nurse-midwife and may need to refer her to our Ob/gyn's after my exam if I think this is needed.  She does not want to do an exam today, then repeat an exam with another provider at her next visit.  Since she is stable today, I think this is a reasonable request.  I did not perform a physical exam or pelvic exam today and will have the pt schedule another visit with one of our OB/Gyn's as soon as possible for evaluation.

## 2018-11-08 ENCOUNTER — Ambulatory Visit: Payer: Medicare Other | Admitting: Obstetrics and Gynecology

## 2018-11-10 ENCOUNTER — Other Ambulatory Visit: Payer: Self-pay

## 2018-11-10 ENCOUNTER — Ambulatory Visit
Admission: RE | Admit: 2018-11-10 | Discharge: 2018-11-10 | Disposition: A | Discharge status: Home / Self Care | Payer: Medicare Other | Source: Ambulatory Visit | Attending: Thoracic Surgery (Cardiothoracic Vascular Surgery) | Admitting: Thoracic Surgery (Cardiothoracic Vascular Surgery)

## 2018-11-10 DIAGNOSIS — Z720 Tobacco use: Secondary | ICD-10-CM

## 2018-11-10 DIAGNOSIS — R911 Solitary pulmonary nodule: Secondary | ICD-10-CM

## 2018-11-10 DIAGNOSIS — R918 Other nonspecific abnormal finding of lung field: Secondary | ICD-10-CM

## 2018-11-13 ENCOUNTER — Encounter: Payer: Medicare Other | Admitting: Gastroenterology

## 2018-11-14 ENCOUNTER — Other Ambulatory Visit: Payer: Self-pay

## 2018-11-14 ENCOUNTER — Ambulatory Visit: Payer: Self-pay | Admitting: Thoracic Surgery (Cardiothoracic Vascular Surgery)

## 2018-11-28 ENCOUNTER — Ambulatory Visit: Payer: Medicare Other | Admitting: Podiatry

## 2018-11-30 ENCOUNTER — Ambulatory Visit: Payer: Medicare Other | Admitting: Podiatry

## 2018-11-30 ENCOUNTER — Other Ambulatory Visit: Payer: Self-pay

## 2018-12-20 ENCOUNTER — Ambulatory Visit (INDEPENDENT_AMBULATORY_CARE_PROVIDER_SITE_OTHER): Payer: Medicare Other | Admitting: Podiatry

## 2018-12-20 ENCOUNTER — Encounter: Payer: Self-pay | Admitting: Podiatry

## 2018-12-20 ENCOUNTER — Other Ambulatory Visit: Payer: Self-pay

## 2018-12-20 ENCOUNTER — Ambulatory Visit: Payer: Medicare Other | Admitting: Podiatry

## 2018-12-20 VITALS — Temp 97.5°F

## 2018-12-20 DIAGNOSIS — Z9229 Personal history of other drug therapy: Secondary | ICD-10-CM | POA: Diagnosis not present

## 2018-12-20 DIAGNOSIS — B351 Tinea unguium: Secondary | ICD-10-CM | POA: Diagnosis not present

## 2018-12-20 DIAGNOSIS — M79676 Pain in unspecified toe(s): Secondary | ICD-10-CM | POA: Diagnosis not present

## 2018-12-20 NOTE — Patient Instructions (Addendum)
Diabetes Mellitus and Foot Care Foot care is an important part of your health, especially when you have diabetes. Diabetes may cause you to have problems because of poor blood flow (circulation) to your feet and legs, which can cause your skin to:  Become thinner and drier.  Break more easily.  Heal more slowly.  Peel and crack. You may also have nerve damage (neuropathy) in your legs and feet, causing decreased feeling in them. This means that you may not notice minor injuries to your feet that could lead to more serious problems. Noticing and addressing any potential problems early is the best way to prevent future foot problems. How to care for your feet Foot hygiene  Wash your feet daily with warm water and mild soap. Do not use hot water. Then, pat your feet and the areas between your toes until they are completely dry. Do not soak your feet as this can dry your skin.  Trim your toenails straight across. Do not dig under them or around the cuticle. File the edges of your nails with an emery board or nail file.  Apply a moisturizing lotion or petroleum jelly to the skin on your feet and to dry, brittle toenails. Use lotion that does not contain alcohol and is unscented. Do not apply lotion between your toes. Shoes and socks  Wear clean socks or stockings every day. Make sure they are not too tight. Do not wear knee-high stockings since they may decrease blood flow to your legs.  Wear shoes that fit properly and have enough cushioning. Always look in your shoes before you put them on to be sure there are no objects inside.  To break in new shoes, wear them for just a few hours a day. This prevents injuries on your feet. Wounds, scrapes, corns, and calluses  Check your feet daily for blisters, cuts, bruises, sores, and redness. If you cannot see the bottom of your feet, use a mirror or ask someone for help.  Do not cut corns or calluses or try to remove them with medicine.  If you  find a minor scrape, cut, or break in the skin on your feet, keep it and the skin around it clean and dry. You may clean these areas with mild soap and water. Do not clean the area with peroxide, alcohol, or iodine.  If you have a wound, scrape, corn, or callus on your foot, look at it several times a day to make sure it is healing and not infected. Check for: ? Redness, swelling, or pain. ? Fluid or blood. ? Warmth. ? Pus or a bad smell. General instructions  Do not cross your legs. This may decrease blood flow to your feet.  Do not use heating pads or hot water bottles on your feet. They may burn your skin. If you have lost feeling in your feet or legs, you may not know this is happening until it is too late.  Protect your feet from hot and cold by wearing shoes, such as at the beach or on hot pavement.  Schedule a complete foot exam at least once a year (annually) or more often if you have foot problems. If you have foot problems, report any cuts, sores, or bruises to your health care provider immediately. Contact a health care provider if:  You have a medical condition that increases your risk of infection and you have any cuts, sores, or bruises on your feet.  You have an injury that is not   healing.  You have redness on your legs or feet.  You feel burning or tingling in your legs or feet.  You have pain or cramps in your legs and feet.  Your legs or feet are numb.  Your feet always feel cold.  You have pain around a toenail. Get help right away if:  You have a wound, scrape, corn, or callus on your foot and: ? You have pain, swelling, or redness that gets worse. ? You have fluid or blood coming from the wound, scrape, corn, or callus. ? Your wound, scrape, corn, or callus feels warm to the touch. ? You have pus or a bad smell coming from the wound, scrape, corn, or callus. ? You have a fever. ? You have a red line going up your leg. Summary  Check your feet every day  for cuts, sores, red spots, swelling, and blisters.  Moisturize feet and legs daily.  Wear shoes that fit properly and have enough cushioning.  If you have foot problems, report any cuts, sores, or bruises to your health care provider immediately.  Schedule a complete foot exam at least once a year (annually) or more often if you have foot problems. This information is not intended to replace advice given to you by your health care provider. Make sure you discuss any questions you have with your health care provider. Document Released: 08/06/2000 Document Revised: 09/21/2017 Document Reviewed: 09/10/2016 Elsevier Interactive Patient Education  2019 Elsevier Inc.  Onychomycosis/Fungal Toenails  WHAT IS IT? An infection that lies within the keratin of your nail plate that is caused by a fungus.  WHY ME? Fungal infections affect all ages, sexes, races, and creeds.  There may be many factors that predispose you to a fungal infection such as age, coexisting medical conditions such as diabetes, or an autoimmune disease; stress, medications, fatigue, genetics, etc.  Bottom line: fungus thrives in a warm, moist environment and your shoes offer such a location.  IS IT CONTAGIOUS? Theoretically, yes.  You do not want to share shoes, nail clippers or files with someone who has fungal toenails.  Walking around barefoot in the same room or sleeping in the same bed is unlikely to transfer the organism.  It is important to realize, however, that fungus can spread easily from one nail to the next on the same foot.  HOW DO WE TREAT THIS?  There are several ways to treat this condition.  Treatment may depend on many factors such as age, medications, pregnancy, liver and kidney conditions, etc.  It is best to ask your doctor which options are available to you.  1. No treatment.   Unlike many other medical concerns, you can live with this condition.  However for many people this can be a painful condition and  may lead to ingrown toenails or a bacterial infection.  It is recommended that you keep the nails cut short to help reduce the amount of fungal nail. 2. Topical treatment.  These range from herbal remedies to prescription strength nail lacquers.  About 40-50% effective, topicals require twice daily application for approximately 9 to 12 months or until an entirely new nail has grown out.  The most effective topicals are medical grade medications available through physicians offices. 3. Oral antifungal medications.  With an 80-90% cure rate, the most common oral medication requires 3 to 4 months of therapy and stays in your system for a year as the new nail grows out.  Oral antifungal medications do require   blood work to make sure it is a safe drug for you.  A liver function panel will be performed prior to starting the medication and after the first month of treatment.  It is important to have the blood work performed to avoid any harmful side effects.  In general, this medication safe but blood work is required. 4. Laser Therapy.  This treatment is performed by applying a specialized laser to the affected nail plate.  This therapy is noninvasive, fast, and non-painful.  It is not covered by insurance and is therefore, out of pocket.  The results have been very good with a 80-95% cure rate.  The [facility] is the only practice in the area to offer this therapy. 5. Permanent Nail Avulsion.  Removing the entire nail so that a new nail will not grow back. 

## 2018-12-22 ENCOUNTER — Ambulatory Visit: Payer: Medicare Other | Admitting: Podiatry

## 2018-12-25 ENCOUNTER — Telehealth: Payer: Self-pay | Admitting: *Deleted

## 2018-12-25 ENCOUNTER — Encounter: Payer: Self-pay | Admitting: Podiatry

## 2018-12-25 NOTE — Progress Notes (Signed)
Subjective: [name] is a 73 y.o. y.o. female who presents for preventative foot care today with PAD and cc of painful, discolored, thick toenails which interferes with daily activities. Pain is aggravated when wearing enclosed shoe gear. Pain is relieved with periodic professional debridement.  [name] is also on Plavix.   Reynold Bowen, MD is his PCP.   Current Outpatient Medications:  .  alclomethasone (ACLOVATE) 0.05 % cream, , Disp: , Rfl:  .  amLODipine (NORVASC) 10 MG tablet, TAKE ONE TABLET BY MOUTH ONCE DAILY, Disp: 90 tablet, Rfl: 1 .  aspirin EC 81 MG tablet, Take 81 mg by mouth daily., Disp: , Rfl:  .  Calcium Carb-Cholecalciferol (CALCIUM 1000 + D PO), Take 1 tablet by mouth every other day. , Disp: , Rfl:  .  clopidogrel (PLAVIX) 75 MG tablet, Take 1 tablet (75 mg total) by mouth daily with breakfast., Disp: 90 tablet, Rfl: 3 .  co-enzyme Q-10 30 MG capsule, Take 30 mg by mouth as needed. , Disp: , Rfl:  .  CONSTULOSE 10 GM/15ML solution, TAKE 30 ML BY MOUTH TWICE DAILY AS NEEDED, Disp: , Rfl:  .  Lancets (ONETOUCH DELICA PLUS [id number]) MISC, 1 each by Other route 2 (two) times daily., Disp: , Rfl:  .  levothyroxine (SYNTHROID, LEVOTHROID) 150 MCG tablet, Take 150 mcg by mouth daily before breakfast., Disp: , Rfl:  .  Multiple Vitamin (MULTIVITAMIN WITH MINERALS) TABS tablet, Take 1 tablet by mouth every morning. Centrum, Disp: , Rfl:  .  ONETOUCH VERIO test strip, 1 each by Other route 2 (two) times daily., Disp: , Rfl:  .  pantoprazole (PROTONIX) 40 MG tablet, Take 1 tablet (40 mg total) by mouth daily., Disp: 30 tablet, Rfl: 6 .  polyethylene glycol-electrolytes (NULYTELY/GOLYTELY) 420 g solution, Take 4,000 mLs by mouth as directed., Disp: 4000 mL, Rfl: 0 .  Propylene Glycol (SYSTANE COMPLETE) 0.6 % SOLN, Place 1 drop into both eyes every morning., Disp: , Rfl:  .  rosuvastatin (CRESTOR) 40 MG tablet, Take 1 tablet (40 mg total) by mouth at bedtime., Disp: 90  tablet, Rfl: 3 No current facility-administered medications for this visit.   Facility-Administered Medications Ordered in Other Visits:  .  tranexamic acid (CYKLOKAPRON) 2,000 mg in sodium chloride 0.9 % 50 mL Topical Application, 8,250 mg, Topical, Once, Cecilio Asper, [employer], PA-C  Allergies  Allergen Reactions  . Atorvastatin Other (See Comments)    Muscle aches Other reaction(s): Other (See Comments) Muscle aches    Objective: Vitals:   12/20/18 1338  Temp: (!) 97.5 F (36.4 C)   Vascular Examination: Capillary refill time immediate x 10 digits.  Dorsalis pedis pulses 1/4 b/l.  Posterior tibial pulses 1/4 b/l.  No digital hair x 10 digits.  Skin temperature WNL b/l.  Dermatological Examination: Skin with normal turgor, texture and tone b/l.  Toenails 1-5 b/l discolored, thick, dystrophic with subungual debris and pain with palpation to nailbeds due to thickness of nails.  Musculoskeletal: Muscle strength 5/5 to all LE muscle groups.  HAV with bunion b/l.  Hammertoes 2-5 b/l.  Neurological: Sensation intact with 10 gram monofilament.  Vibratory sensation intact b/l.  Assessment: 1. Painful onychomycosis toenails 1-5 b/l 2. Pt on long term blood thinner  Plan: 1. Toenails 1-5 b/l were debrided in length and girth without iatrogenic bleeding. 2. Patient to continue soft, supportive shoe gear daily. 3. Patient to report any pedal injuries to medical professional immediately. 4. Follow up 3 months.  5. Patient/POA to  call should there be a concern in the interim. 

## 2018-12-25 NOTE — Telephone Encounter (Signed)
Called pt to reschedule colonoscopy.  She could not have her procedure on a Friday and that was all that was available.  I advised her that we will call back to reschedule her once the June schedule is out.

## 2019-01-03 ENCOUNTER — Telehealth: Payer: Self-pay | Admitting: *Deleted

## 2019-01-03 NOTE — Telephone Encounter (Signed)
Yes, that is still a valid recommendation.  Thanks for checking.

## 2019-01-03 NOTE — Telephone Encounter (Signed)
Dr. Ardis Hughs,  I am trying to reschedule this pt's colonoscopy.  Is it ok to use the Plavix clearance from 10-11-18- hold 5 days prior to procedure.?  Thanks, [employer]

## 2019-01-03 NOTE — Telephone Encounter (Signed)
Dr. Ardis Hughs' recommendations noted.  LMOM to let pt know that June schedule is available and to call back to schedule procedure.

## 2019-01-11 ENCOUNTER — Other Ambulatory Visit: Payer: Medicare Other

## 2019-01-16 NOTE — Telephone Encounter (Signed)
PV on 01/24/19 and Colonoscopy appointment on 02/07/2019. Pt aware.

## 2019-01-24 ENCOUNTER — Other Ambulatory Visit: Payer: Self-pay

## 2019-01-24 ENCOUNTER — Ambulatory Visit (AMBULATORY_SURGERY_CENTER): Payer: Self-pay | Admitting: *Deleted

## 2019-01-24 VITALS — Ht 63.0 in | Wt 136.0 lb

## 2019-01-24 DIAGNOSIS — Z1211 Encounter for screening for malignant neoplasm of colon: Secondary | ICD-10-CM

## 2019-01-24 NOTE — Progress Notes (Signed)
Patient denies any allergies to eggs or soy. Patient denies any problems with anesthesia/sedation. Patient denies any oxygen use at home. Patient denies taking any diet/weight loss medications or blood thinners. EMMI education assisgned to patient on colonoscopy, this was explained and instructions given to patient. Patient has Golytely prep at home.

## 2019-01-30 ENCOUNTER — Encounter: Payer: Self-pay | Admitting: Gastroenterology

## 2019-01-30 ENCOUNTER — Telehealth: Payer: Self-pay | Admitting: *Deleted

## 2019-01-30 NOTE — Telephone Encounter (Signed)
A message was left, re: follow up visit. 

## 2019-02-06 ENCOUNTER — Telehealth: Payer: Self-pay | Admitting: Gastroenterology

## 2019-02-06 NOTE — Telephone Encounter (Signed)
Covid-19 Screening Questions   Do you now or have you had a fever in the last 14 days?  no     Do you have any respiratory symptoms of shortness of breath or cough now or in the last 14 days?    no  Do you have any family members or close contacts with diagnosed or suspected Covid-19 in the past 14 days?    no  Have you been tested for Covid-19 and found to be positive?    no

## 2019-02-06 NOTE — Telephone Encounter (Signed)
Covid-19 Screening Questions     Do you now or have you had a fever in the last 14 days?        Do you have any respiratory symptoms of shortness of breath or cough now or in the last 14 days?       Do you have any family members or close contacts with diagnosed or suspected Covid-19 in the past 14 days?       Have you been tested for Covid-19 and found to be positive?

## 2019-02-07 ENCOUNTER — Other Ambulatory Visit: Payer: Self-pay

## 2019-02-07 ENCOUNTER — Encounter: Payer: Medicare Other | Admitting: Gastroenterology

## 2019-02-07 ENCOUNTER — Encounter: Payer: Self-pay | Admitting: Gastroenterology

## 2019-02-07 ENCOUNTER — Ambulatory Visit (AMBULATORY_SURGERY_CENTER): Payer: Medicare Other | Admitting: Gastroenterology

## 2019-02-07 VITALS — BP 127/74 | HR 69 | Temp 97.7°F | Resp 23 | Ht 63.0 in | Wt 136.0 lb

## 2019-02-07 DIAGNOSIS — Z1211 Encounter for screening for malignant neoplasm of colon: Secondary | ICD-10-CM | POA: Diagnosis not present

## 2019-02-07 DIAGNOSIS — D125 Benign neoplasm of sigmoid colon: Secondary | ICD-10-CM

## 2019-02-07 DIAGNOSIS — D123 Benign neoplasm of transverse colon: Secondary | ICD-10-CM

## 2019-02-07 MED ORDER — SODIUM CHLORIDE 0.9 % IV SOLN: 500.0000 mL | Freq: Once | INTRAVENOUS | Status: DC

## 2019-02-07 NOTE — Progress Notes (Signed)
PT taken to PACU. Monitors in place. VSS. Report given to RN. 

## 2019-02-07 NOTE — Patient Instructions (Addendum)
Impression/recommendations:  Polyps (handout given)  YOU HAD AN ENDOSCOPIC PROCEDURE TODAY AT [facility]:   Refer to the procedure report that was given to you for any specific questions about what was found during the examination.  If the procedure report does not answer your questions, please call your gastroenterologist to clarify.  If you requested that your care partner not be given the details of your procedure findings, then the procedure report has been included in a sealed envelope for you to review at your convenience later.  YOU SHOULD EXPECT: Some feelings of bloating in the abdomen. Passage of more gas than usual.  Walking can help get rid of the air that was put into your GI tract during the procedure and reduce the bloating. If you had a lower endoscopy (such as a colonoscopy or flexible sigmoidoscopy) you may notice spotting of blood in your stool or on the toilet paper. If you underwent a bowel prep for your procedure, you may not have a normal bowel movement for a few days.  Please Note:  You might notice some irritation and congestion in your nose or some drainage.  This is from the oxygen used during your procedure.  There is no need for concern and it should clear up in a day or so.  SYMPTOMS TO REPORT IMMEDIATELY:   Following lower endoscopy (colonoscopy or flexible sigmoidoscopy):  Excessive amounts of blood in the stool  Significant tenderness or worsening of abdominal pains  Swelling of the abdomen that is new, acute  Fever of 100F or higher  For urgent or emergent issues, a gastroenterologist can be reached at any hour by calling [phone].   DIET:  We do recommend a small meal at first, but then you may proceed to your regular diet.  Drink plenty of fluids but you should avoid alcoholic beverages for 24 hours.  ACTIVITY:  You should plan to take it easy for the rest of today and you should NOT DRIVE or use heavy machinery until tomorrow  (because of the sedation medicines used during the test).    FOLLOW UP: Our staff will call the number listed on your records 48-72 hours following your procedure to check on you and address any questions or concerns that you may have regarding the information given to you following your procedure. If we do not reach you, we will leave a message.  We will attempt to reach you two times.  During this call, we will ask if you have developed any symptoms of COVID 19. If you develop any symptoms (ie: fever, flu-like symptoms, shortness of breath, cough etc.) before then, please call [phone].  If you test positive for Covid 19 in the 2 weeks post procedure, please call and report this information to [place].    If any biopsies were taken you will be contacted by phone or by letter within the next 1-3 weeks.  Please call us at [phone] if you have not heard about the biopsies in 3 weeks.    SIGNATURES/CONFIDENTIALITY: You and/or your care partner have signed paperwork which will be entered into your electronic medical record.  These signatures attest to the fact that that the information above on your After Visit Summary has been reviewed and is understood.  Full responsibility of the confidentiality of this discharge information lies with you and/or your care-partner.

## 2019-02-07 NOTE — Op Note (Addendum)
[facility] Patient Name: [name] Procedure Date: 02/07/2019 10:12 AM MRN: [id number] Endoscopist: Milus Banister , MD Age: 73 Referring MD:  Date of Birth: [birth year] Gender: Female Account #: [id number] Procedure:                Colonoscopy Indications:              Screening for colorectal malignant neoplasm Medicines:                Monitored Anesthesia Care Procedure:                Pre-Anesthesia Assessment:                           - Prior to the procedure, a History and Physical                            was performed, and patient medications and                            allergies were reviewed. The patient's tolerance of                            previous anesthesia was also reviewed. The risks                            and benefits of the procedure and the sedation                            options and risks were discussed with the patient.                            All questions were answered, and informed consent                            was obtained. Prior Anticoagulants: The patient has                            taken Plavix (clopidogrel), last dose was 5 days                            prior to procedure. ASA Grade Assessment: II - A                            patient with mild systemic disease. After reviewing                            the risks and benefits, the patient was deemed in                            satisfactory condition to undergo the procedure.                           After obtaining informed consent, the colonoscope  was passed under direct vision. Throughout the                            procedure, the patient's blood pressure, pulse, and                            oxygen saturations were monitored continuously. The                            Model CF-HQ190L [id number]) scope was introduced                            through the anus and advanced to the the cecum,   identified by appendiceal orifice and ileocecal                            valve. The colonoscopy was performed without                            difficulty. The patient tolerated the procedure                            well. The quality of the bowel preparation was                            good. The ileocecal valve, appendiceal orifice, and                            rectum were photographed. Scope In: 10:26:24 AM Scope Out: 10:42:44 AM Scope Withdrawal Time: 0 hours 8 minutes 22 seconds  Total Procedure Duration: 0 hours 16 minutes 20 seconds  Findings:                 Two sessile polyps were found in the sigmoid colon                            and transverse colon. The polyps were 2 to 10 mm in                            size. These polyps were removed with a cold snare.                            Resection and retrieval were complete.                           The exam was otherwise without abnormality. Complications:            No immediate complications. Estimated blood loss:                            None. Estimated Blood Loss:     Estimated blood loss: none. Impression:               - Two 2 to 10 mm polyps in the sigmoid colon and in  the transverse colon, removed with a cold snare.                            Resected and retrieved.                           - The examination was otherwise normal. Recommendation:           - Patient has a contact number available for                            emergencies. The signs and symptoms of potential                            delayed complications were discussed with the                            patient. Return to normal activities tomorrow.                            Written discharge instructions were provided to the                            patient.                           - Resume previous diet.                           - Continue present medications. OK to resume plavix                             tomorrow.                           You will receive a letter within 2-3 weeks with the                            pathology results and my final recommendations.                           If the polyp(s) is proven to be 'pre-cancerous' on                            pathology, you will need repeat colonoscopy in 3                            years. Milus Banister, MD 02/07/2019 10:45:46 AM This report has been signed electronically.

## 2019-02-09 ENCOUNTER — Telehealth: Payer: Self-pay | Admitting: *Deleted

## 2019-02-09 NOTE — Telephone Encounter (Signed)
  Follow up Call-  Call back number 02/07/2019  Post procedure Call Back phone  # [phone]  Permission to leave phone message Yes  Some recent data might be hidden     Patient questions:  Message left to call us if necessary.  Second call.

## 2019-02-09 NOTE — Telephone Encounter (Signed)
  Follow up Call-  Call back number 02/07/2019  Post procedure Call Back phone  # [phone]  Permission to leave phone message Yes  Some recent data might be hidden     Patient questions:  Message left to call us if necessary.

## 2019-02-09 NOTE — Telephone Encounter (Signed)
Pt called back and is not having any problems after procedure

## 2019-02-12 ENCOUNTER — Encounter: Payer: Self-pay | Admitting: Gastroenterology

## 2019-02-19 NOTE — Telephone Encounter (Signed)
A message was left, re: follow up visit. 

## 2019-03-01 ENCOUNTER — Other Ambulatory Visit: Payer: Self-pay | Admitting: Internal Medicine

## 2019-03-01 DIAGNOSIS — Z13820 Encounter for screening for osteoporosis: Secondary | ICD-10-CM

## 2019-03-06 ENCOUNTER — Other Ambulatory Visit: Payer: Medicare Other

## 2019-03-06 ENCOUNTER — Telehealth: Payer: Self-pay | Admitting: Cardiovascular Disease

## 2019-03-06 NOTE — Telephone Encounter (Signed)
Ok for ECG Friday Kirk Ruths

## 2019-03-06 NOTE — Telephone Encounter (Signed)
Spoke with pt, aware chart marked for ECG Friday.

## 2019-03-06 NOTE — Telephone Encounter (Signed)
New message:     Patient calling stating that she need to speak with the nurse before her appt on Friday.

## 2019-03-06 NOTE — Telephone Encounter (Signed)
Called patient back- she states she would like to have an EKG completed during her appointment on Friday as she has had some tightness under her left breast- she describes as a pain/tightness, rated it a 7 on pain scale, has been there since last Sunday, she states she has been using an ice pack and it seems to help. It gets worse at night when she lays down, she denies swelling, or any new SOB (she does have an inhaler that she is using), she states there is not increased pain when she moves a certain way, just noticed it and would like to have an EKG completed.  Advised patient that if pain continues and does not go away and worsens to go be evaluated, and not wait for appointment. Patient verbalized understanding.   Advised would route to MD's for any other recommendations as well as nurse for appointment on Friday to make aware about the EKG need.

## 2019-03-08 ENCOUNTER — Telehealth: Payer: Self-pay | Admitting: Cardiovascular Disease

## 2019-03-08 NOTE — Telephone Encounter (Signed)
I called pt to confirm her appt on 03-09-19 with Dr Gwenlyn Found.       COVID-19 Pre-Screening Questions:   In the past 7 to 10 days have you had a cough,  shortness of breath, headache, congestion, fever (100 or greater) body aches, chills, sore throat, or sudden loss of taste or sense of smell? no  Have you been around anyone with known Covid 19.  Have you been around anyone who is awaiting Covid 19 test results in the past 7 to 10 days? no  Have you been around anyone who has been exposed to Covid 19, or has mentioned symptoms of Covid 19 within the past 7 to 10 days? no  If you have any concerns/questions about symptoms patients report during screening (either on the phone or at threshold). Contact the provider seeing the patient or DOD for further guidance.  If neither are available contact a member of the leadership team.

## 2019-03-09 ENCOUNTER — Ambulatory Visit (INDEPENDENT_AMBULATORY_CARE_PROVIDER_SITE_OTHER): Payer: Medicare Other | Admitting: Cardiovascular Disease

## 2019-03-09 ENCOUNTER — Encounter: Payer: Self-pay | Admitting: Cardiovascular Disease

## 2019-03-09 ENCOUNTER — Other Ambulatory Visit: Payer: Self-pay

## 2019-03-09 VITALS — BP 137/83 | HR 90 | Temp 96.8°F | Ht 63.0 in | Wt 134.0 lb

## 2019-03-09 DIAGNOSIS — I739 Peripheral vascular disease, unspecified: Secondary | ICD-10-CM

## 2019-03-09 DIAGNOSIS — I10 Essential (primary) hypertension: Secondary | ICD-10-CM

## 2019-03-09 NOTE — Progress Notes (Signed)
03/09/2019 [name]   1946/08/14  [id number]  Primary Physician [name], FNP Primary Cardiologist: Lorretta Harp MD Garret Reddish, [place], [place]  HPI:  [name] is a 73 y.o.  African American female patient of Dr. Jacalyn Lefevre referred for peripheral vascular evaluation. She was seen by Dr. Gladstone Lighter orthopedic surgeon, who referred her here. I last saw her in the office  02/24/2018. Her past history is remarkable for ischemic heart disease status post coronary bypass grafting in March 2012 of the LIMA to LAD, vein to the diagonal branch, obtuse marginal branch and a vein to acute marginal branch. She has normal LV function by 2-D echo. Further problems include hypertension, and hyperlipidemia. She complains of right lower extremity claudication and had seen Dr. Fletcher Anon in the past who thought that her pain was arthritic in nature. Lower extremity arterial Doppler studies performed 07/07/14 revealed a right ABI 0.43 with an occluded distal right SFA and popliteal artery, left ABI 0.73 with a high-frequency signal in the mid left SFA. I performed angiography on her 753/15 revealing an occluded left SFA with one vessel runoff via the peroneal. I performed [employer] directional atherectomy followed by PTA using drug-eluting balloon with excellent angiographic result. Her followup Doppler revealed an increase in her right ABI from 0.4 to .7 with resolution of her claudication symptoms. Since I saw her over 2 years ago she developed claudication in her left leg now. Isuspected thatherpreviouslydocumented lesions in her left external leg artery and SFA have progressed.She underwent angiography by myself 06/07/16 revealing 80% segmental mid left SFA stenosis. I performed [facility] 1 directional atherectomy followed by drug-eluting balloon into plasty. She will vessel runoff via peroneal. She did have 50% distal left common iliac artery stenosis without a gradient. Her follow-up lower  extremity Doppler studies performed 06/24/16 were markedly improved ABI 1.04. Because of right lower extremity claudication and markedly reduced right ABI of 0.46 she underwent angiography by myself on 02/06/2018 really 90% distal right common femoral artery stenosis, total occlusion in the distal right SFA with one-vessel runoff via peroneal.  I performed directional atherectomy followed by drug-eluting balloon angioplasty of the distal SFA occlusion and chocolate balloon angioplasty of the distal right common femoral artery.  She had follow-up Doppler studies performed in our office 08/25/2017 revealed marked improvement with a right ABI of 0.82.  Her claudication has  markedly improved.  Since I saw her a year ago she is done well.  She does complain of some atypical left inframammary pain which is positional and worse when she touches it.  Does not sound anginal.  She denies claudication but does have some restless leg syndrome at night.    Current Meds  Medication Sig   albuterol (VENTOLIN HFA) 108 (90 Base) MCG/ACT inhaler INHALE 1 PUFF BY MOUTH EVERY 6 HOURS AS NEEDED FOR SHORTNESS OF BREATH AND FOR WHEEZING   alclomethasone (ACLOVATE) 0.05 % cream    amLODipine (NORVASC) 10 MG tablet TAKE ONE TABLET BY MOUTH ONCE DAILY   aspirin EC 81 MG tablet Take 81 mg by mouth daily.   Calcium Carb-Cholecalciferol (CALCIUM 1000 + D PO) Take 1 tablet by mouth every other day.    clopidogrel (PLAVIX) 75 MG tablet Take 1 tablet (75 mg total) by mouth daily with breakfast.   co-enzyme Q-10 30 MG capsule Take 30 mg by mouth as needed.    CONSTULOSE 10 GM/15ML solution TAKE 30 ML BY MOUTH TWICE DAILY AS NEEDED  cyclobenzaprine (FLEXERIL) 5 MG tablet TAKE 1 TABLET BY MOUTH TWICE DAILY AS NEEDED FOR MUSCLE SPASM   Lancets (ONETOUCH DELICA PLUS TIRWER15Q) MISC 1 each by Other route 2 (two) times daily.   levothyroxine (SYNTHROID, LEVOTHROID) 150 MCG tablet Take 150 mcg by mouth daily before breakfast.     Multiple Vitamin (MULTIVITAMIN WITH MINERALS) TABS tablet Take 1 tablet by mouth every morning. Centrum   ONETOUCH VERIO test strip 1 each by Other route 2 (two) times daily.   pantoprazole (PROTONIX) 40 MG tablet Take 1 tablet (40 mg total) by mouth daily.   Propylene Glycol (SYSTANE COMPLETE) 0.6 % SOLN Place 1 drop into both eyes every morning.   Current Facility-Administered Medications for the 03/09/19 encounter (Office Visit) with Lorretta Harp, MD  Medication   0.9 %  sodium chloride infusion     Allergies  Allergen Reactions   Atorvastatin Other (See Comments)    Muscle aches Other reaction(s): Other (See Comments) Muscle aches    Social History   Socioeconomic History   Marital status: Divorced    Spouse name: Not on file   Number of children: 2   Years of education: 14   Highest education level: Not on file  Occupational History   Occupation: Retired    Fish farm manager: RETIRED  Scientist, product/process development strain: Not on file   Food insecurity    Worry: Not on file    Inability: Not on file   Transportation needs    Medical: Not on file    Non-medical: Not on file  Tobacco Use   Smoking status: Former Smoker    Packs/day: 0.80    Years: 45.00    Pack years: 36.00    Types: Cigarettes    Quit date: 08/23/2009    Years since quitting: 9.5   Smokeless tobacco: Never Used  Substance and Sexual Activity   Alcohol use: Yes    Alcohol/week: 2.0 standard drinks    Types: 2 Glasses of wine per week   Drug use: Never   Sexual activity: Not Currently  Lifestyle   Physical activity    Days per week: Not on file    Minutes per session: Not on file   Stress: Not on file  Relationships   Social connections    Talks on phone: Not on file    Gets together: Not on file    Attends religious service: Not on file    Active member of club or organization: Not on file    Attends meetings of clubs or organizations: Not on file    Relationship  status: Not on file   Intimate partner violence    Fear of current or ex partner: Not on file    Emotionally abused: Not on file    Physically abused: Not on file    Forced sexual activity: Not on file  Other Topics Concern   Not on file  Social History Narrative   Not on file     Review of Systems: General: negative for chills, fever, night sweats or weight changes.  Cardiovascular: negative for chest pain, dyspnea on exertion, edema, orthopnea, palpitations, paroxysmal nocturnal dyspnea or shortness of breath Dermatological: negative for rash Respiratory: negative for cough or wheezing Urologic: negative for hematuria Abdominal: negative for nausea, vomiting, diarrhea, bright red blood per rectum, melena, or hematemesis Neurologic: negative for visual changes, syncope, or dizziness All other systems reviewed and are otherwise negative except as noted above.    Blood  pressure 137/83, pulse 90, temperature (!) 96.8 F (36 C), height 5\' 3"  (1.6 m), weight 134 lb (60.8 kg), SpO2 93 %.  General appearance: alert and no distress Neck: no adenopathy, no carotid bruit, no JVD, supple, symmetrical, trachea midline and thyroid not enlarged, symmetric, no tenderness/mass/nodules Lungs: clear to auscultation bilaterally Heart: regular rate and rhythm, S1, S2 normal, no murmur, click, rub or gallop Extremities: extremities normal, atraumatic, no cyanosis or edema Pulses: Diminished pedal pulses bilaterally Skin: Skin color, texture, turgor normal. No rashes or lesions Neurologic: Alert and oriented X 3, normal strength and tone. Normal symmetric reflexes. Normal coordination and gait  EKG sinus rhythm at 90 with septal Q waves and nonspecific ST and T wave changes.  I personally reviewed this EKG.  ASSESSMENT AND PLAN:   PVD (peripheral vascular disease) ([place]) History of peripheral arterial disease status post angiography by myself six 7/15 with atherectomy and drug-coated balloon  angioplasty of a right SFA CTO resulting in an increase in her right ABI from 0.4 up to 2.7 with resolution of her claudication symptoms.  She had left SFA intervention 06/07/2016 of the mid left SFA.  She did have a 5060% distal left common iliac artery stenosis at that time with no gradient.  Her right SFA was widely patent.  She had one-vessel runoff via the peroneal bilaterally.  Her most recent procedure was 02/06/2018 at which time she had atherectomy of her entire right SFA for CTO.  That time she had tandem 70% stenoses in her left SFA.  Her most recent Doppler study on the right side performed 02/20/2018 revealed a right ABI 0.82 and a left of 0.84 with a patent right SFA.  She denies claudication.      Lorretta Harp MD FACP,FACC,FAHA, [facility] 03/09/2019 9:05 AM

## 2019-03-09 NOTE — Patient Instructions (Signed)
Medication Instructions:  Your physician recommends that you continue on your current medications as directed. Please refer to the Current Medication list given to you today.  If you need a refill on your cardiac medications before your next appointment, please call your pharmacy.   Lab work: NONE If you have labs (blood work) drawn today and your tests are completely normal, you will receive your results only by: Marland Kitchen MyChart Message (if you have MyChart) OR . A paper copy in the mail If you have any lab test that is abnormal or we need to change your treatment, we will call you to review the results.  Testing/Procedures: Your physician has requested that you have a lower or upper extremity arterial duplex. This test is an ultrasound of the arteries in the legs or arms. It looks at arterial blood flow in the legs and arms. Allow one hour for Lower and Upper Arterial scans. There are no restrictions or special instructions To be scheduled  Your physician has requested that you have an ankle brachial index (ABI). During this test an ultrasound and blood pressure cuff are used to evaluate the arteries that supply the arms and legs with blood. Allow thirty minutes for this exam. There are no restrictions or special instructions. To be scheduled   Follow-Up: At [facility], you and your health needs are our priority.  As part of our continuing mission to provide you with exceptional heart care, we have created designated Provider Care Teams.  These Care Teams include your primary Cardiologist (physician) and Advanced Practice Providers (APPs -  Physician Assistants and Nurse Practitioners) who all work together to provide you with the care you need, when you need it. You will need a follow up appointment in 12 months WITH DR. Gwenlyn Found.  Please call our office 2 months in advance to schedule this appointment.

## 2019-03-09 NOTE — Assessment & Plan Note (Signed)
History of peripheral arterial disease status post angiography by myself six 7/15 with atherectomy and drug-coated balloon angioplasty of a right SFA CTO resulting in an increase in her right ABI from 0.4 up to 2.7 with resolution of her claudication symptoms.  She had left SFA intervention 06/07/2016 of the mid left SFA.  She did have a 5060% distal left common iliac artery stenosis at that time with no gradient.  Her right SFA was widely patent.  She had one-vessel runoff via the peroneal bilaterally.  Her most recent procedure was 02/06/2018 at which time she had atherectomy of her entire right SFA for CTO.  That time she had tandem 70% stenoses in her left SFA.  Her most recent Doppler study on the right side performed 02/20/2018 revealed a right ABI 0.82 and a left of 0.84 with a patent right SFA.  She denies claudication.

## 2019-03-12 ENCOUNTER — Other Ambulatory Visit: Payer: Self-pay | Admitting: Cardiovascular Disease

## 2019-03-12 DIAGNOSIS — I739 Peripheral vascular disease, unspecified: Secondary | ICD-10-CM

## 2019-03-14 ENCOUNTER — Ambulatory Visit
Admission: RE | Admit: 2019-03-14 | Discharge: 2019-03-14 | Disposition: A | Discharge status: Home / Self Care | Payer: Medicare Other | Source: Ambulatory Visit | Attending: Internal Medicine | Admitting: Internal Medicine

## 2019-03-14 ENCOUNTER — Other Ambulatory Visit: Payer: Self-pay

## 2019-03-14 DIAGNOSIS — Z13820 Encounter for screening for osteoporosis: Secondary | ICD-10-CM

## 2019-03-21 ENCOUNTER — Ambulatory Visit: Payer: Medicare Other | Admitting: Podiatry

## 2019-03-27 ENCOUNTER — Ambulatory Visit (HOSPITAL_COMMUNITY)
Admission: RE | Admit: 2019-03-27 | Discharge: 2019-03-27 | Disposition: A | Discharge status: Home / Self Care | Payer: Medicare Other | Source: Ambulatory Visit | Attending: Cardiovascular Disease | Admitting: Cardiovascular Disease

## 2019-03-27 ENCOUNTER — Other Ambulatory Visit: Payer: Self-pay

## 2019-03-27 DIAGNOSIS — I739 Peripheral vascular disease, unspecified: Secondary | ICD-10-CM | POA: Diagnosis not present

## 2019-04-02 ENCOUNTER — Encounter: Payer: Self-pay | Admitting: Podiatry

## 2019-04-02 ENCOUNTER — Ambulatory Visit: Payer: Medicare Other | Admitting: Podiatry

## 2019-04-02 ENCOUNTER — Ambulatory Visit
Admission: RE | Admit: 2019-04-02 | Discharge: 2019-04-02 | Disposition: A | Discharge status: Home / Self Care | Payer: Medicare Other | Source: Ambulatory Visit | Attending: Family Medicine | Admitting: Family Medicine

## 2019-04-02 ENCOUNTER — Other Ambulatory Visit: Payer: Self-pay | Admitting: *Deleted

## 2019-04-02 ENCOUNTER — Other Ambulatory Visit: Payer: Self-pay | Admitting: Family Medicine

## 2019-04-02 ENCOUNTER — Other Ambulatory Visit: Payer: Self-pay

## 2019-04-02 VITALS — Temp 98.2°F

## 2019-04-02 DIAGNOSIS — L84 Corns and callosities: Secondary | ICD-10-CM

## 2019-04-02 DIAGNOSIS — M25512 Pain in left shoulder: Secondary | ICD-10-CM

## 2019-04-02 DIAGNOSIS — E119 Type 2 diabetes mellitus without complications: Secondary | ICD-10-CM | POA: Diagnosis not present

## 2019-04-02 DIAGNOSIS — I739 Peripheral vascular disease, unspecified: Secondary | ICD-10-CM

## 2019-04-02 DIAGNOSIS — B351 Tinea unguium: Secondary | ICD-10-CM

## 2019-04-02 DIAGNOSIS — M79676 Pain in unspecified toe(s): Secondary | ICD-10-CM

## 2019-04-02 DIAGNOSIS — Z9229 Personal history of other drug therapy: Secondary | ICD-10-CM | POA: Diagnosis not present

## 2019-04-02 NOTE — Patient Instructions (Signed)
Diabetes Mellitus and Foot Care Foot care is an important part of your health, especially when you have diabetes. Diabetes may cause you to have problems because of poor blood flow (circulation) to your feet and legs, which can cause your skin to:  Become thinner and drier.  Break more easily.  Heal more slowly.  Peel and crack. You may also have nerve damage (neuropathy) in your legs and feet, causing decreased feeling in them. This means that you may not notice minor injuries to your feet that could lead to more serious problems. Noticing and addressing any potential problems early is the best way to prevent future foot problems. How to care for your feet Foot hygiene  Wash your feet daily with warm water and mild soap. Do not use hot water. Then, pat your feet and the areas between your toes until they are completely dry. Do not soak your feet as this can dry your skin.  Trim your toenails straight across. Do not dig under them or around the cuticle. File the edges of your nails with an emery board or nail file.  Apply a moisturizing lotion or petroleum jelly to the skin on your feet and to dry, brittle toenails. Use lotion that does not contain alcohol and is unscented. Do not apply lotion between your toes. Shoes and socks  Wear clean socks or stockings every day. Make sure they are not too tight. Do not wear knee-high stockings since they may decrease blood flow to your legs.  Wear shoes that fit properly and have enough cushioning. Always look in your shoes before you put them on to be sure there are no objects inside.  To break in new shoes, wear them for just a few hours a day. This prevents injuries on your feet. Wounds, scrapes, corns, and calluses  Check your feet daily for blisters, cuts, bruises, sores, and redness. If you cannot see the bottom of your feet, use a mirror or ask someone for help.  Do not cut corns or calluses or try to remove them with medicine.  If you  find a minor scrape, cut, or break in the skin on your feet, keep it and the skin around it clean and dry. You may clean these areas with mild soap and water. Do not clean the area with peroxide, alcohol, or iodine.  If you have a wound, scrape, corn, or callus on your foot, look at it several times a day to make sure it is healing and not infected. Check for: ? Redness, swelling, or pain. ? Fluid or blood. ? Warmth. ? Pus or a bad smell. General instructions  Do not cross your legs. This may decrease blood flow to your feet.  Do not use heating pads or hot water bottles on your feet. They may burn your skin. If you have lost feeling in your feet or legs, you may not know this is happening until it is too late.  Protect your feet from hot and cold by wearing shoes, such as at the beach or on hot pavement.  Schedule a complete foot exam at least once a year (annually) or more often if you have foot problems. If you have foot problems, report any cuts, sores, or bruises to your health care provider immediately. Contact a health care provider if:  You have a medical condition that increases your risk of infection and you have any cuts, sores, or bruises on your feet.  You have an injury that is not   healing.  You have redness on your legs or feet.  You feel burning or tingling in your legs or feet.  You have pain or cramps in your legs and feet.  Your legs or feet are numb.  Your feet always feel cold.  You have pain around a toenail. Get help right away if:  You have a wound, scrape, corn, or callus on your foot and: ? You have pain, swelling, or redness that gets worse. ? You have fluid or blood coming from the wound, scrape, corn, or callus. ? Your wound, scrape, corn, or callus feels warm to the touch. ? You have pus or a bad smell coming from the wound, scrape, corn, or callus. ? You have a fever. ? You have a red line going up your leg. Summary  Check your feet every day  for cuts, sores, red spots, swelling, and blisters.  Moisturize feet and legs daily.  Wear shoes that fit properly and have enough cushioning.  If you have foot problems, report any cuts, sores, or bruises to your health care provider immediately.  Schedule a complete foot exam at least once a year (annually) or more often if you have foot problems. This information is not intended to replace advice given to you by your health care provider. Make sure you discuss any questions you have with your health care provider. Document Released: 08/06/2000 Document Revised: 09/21/2017 Document Reviewed: 09/10/2016 Elsevier Patient Education  2020 Elsevier [employer].   Onychomycosis/Fungal Toenails  WHAT IS IT? An infection that lies within the keratin of your nail plate that is caused by a fungus.  WHY ME? Fungal infections affect all ages, sexes, races, and creeds.  There may be many factors that predispose you to a fungal infection such as age, coexisting medical conditions such as diabetes, or an autoimmune disease; stress, medications, fatigue, genetics, etc.  Bottom line: fungus thrives in a warm, moist environment and your shoes offer such a location.  IS IT CONTAGIOUS? Theoretically, yes.  You do not want to share shoes, nail clippers or files with someone who has fungal toenails.  Walking around barefoot in the same room or sleeping in the same bed is unlikely to transfer the organism.  It is important to realize, however, that fungus can spread easily from one nail to the next on the same foot.  HOW DO WE TREAT THIS?  There are several ways to treat this condition.  Treatment may depend on many factors such as age, medications, pregnancy, liver and kidney conditions, etc.  It is best to ask your doctor which options are available to you.  1. No treatment.   Unlike many other medical concerns, you can live with this condition.  However for many people this can be a painful condition and may lead to  ingrown toenails or a bacterial infection.  It is recommended that you keep the nails cut short to help reduce the amount of fungal nail. 2. Topical treatment.  These range from herbal remedies to prescription strength nail lacquers.  About 40-50% effective, topicals require twice daily application for approximately 9 to 12 months or until an entirely new nail has grown out.  The most effective topicals are medical grade medications available through physicians offices. 3. Oral antifungal medications.  With an 80-90% cure rate, the most common oral medication requires 3 to 4 months of therapy and stays in your system for a year as the new nail grows out.  Oral antifungal medications do require   blood work to make sure it is a safe drug for you.  A liver function panel will be performed prior to starting the medication and after the first month of treatment.  It is important to have the blood work performed to avoid any harmful side effects.  In general, this medication safe but blood work is required. 4. Laser Therapy.  This treatment is performed by applying a specialized laser to the affected nail plate.  This therapy is noninvasive, fast, and non-painful.  It is not covered by insurance and is therefore, out of pocket.  The results have been very good with a 80-95% cure rate.  [facility] is the only practice in the area to offer this therapy. 5. Permanent Nail Avulsion.  Removing the entire nail so that a new nail will not grow back. 

## 2019-04-02 NOTE — Progress Notes (Signed)
vas 

## 2019-04-04 ENCOUNTER — Encounter: Payer: Self-pay | Admitting: *Deleted

## 2019-04-10 NOTE — Progress Notes (Signed)
Subjective: [name] is a 73 y.o. y.o. female who presents today for preventative diabetic foot care.  She voices no new pedal concerns on today's visit. She remains on blood thinner, Plavix.  [name], FNP is her PCp.    Current Outpatient Medications:  .  albuterol (VENTOLIN HFA) 108 (90 Base) MCG/ACT inhaler, INHALE 1 PUFF BY MOUTH EVERY 6 HOURS AS NEEDED FOR SHORTNESS OF BREATH AND FOR WHEEZING, Disp: , Rfl:  .  alclomethasone (ACLOVATE) 0.05 % cream, , Disp: , Rfl:  .  amLODipine (NORVASC) 10 MG tablet, TAKE ONE TABLET BY MOUTH ONCE DAILY, Disp: 90 tablet, Rfl: 1 .  aspirin EC 81 MG tablet, Take 81 mg by mouth daily., Disp: , Rfl:  .  Calcium Carb-Cholecalciferol (CALCIUM 1000 + D PO), Take 1 tablet by mouth every other day. , Disp: , Rfl:  .  clopidogrel (PLAVIX) 75 MG tablet, Take 1 tablet (75 mg total) by mouth daily with breakfast., Disp: 90 tablet, Rfl: 3 .  co-enzyme Q-10 30 MG capsule, Take 30 mg by mouth as needed. , Disp: , Rfl:  .  CONSTULOSE 10 GM/15ML solution, TAKE 30 ML BY MOUTH TWICE DAILY AS NEEDED, Disp: , Rfl:  .  cyclobenzaprine (FLEXERIL) 5 MG tablet, TAKE 1 TABLET BY MOUTH TWICE DAILY AS NEEDED FOR MUSCLE SPASM, Disp: , Rfl:  .  Lancets (ONETOUCH DELICA PLUS [id number]) MISC, 1 each by Other route 2 (two) times daily., Disp: , Rfl:  .  levothyroxine (SYNTHROID, LEVOTHROID) 150 MCG tablet, Take 150 mcg by mouth daily before breakfast., Disp: , Rfl:  .  Multiple Vitamin (MULTIVITAMIN WITH MINERALS) TABS tablet, Take 1 tablet by mouth every morning. Centrum, Disp: , Rfl:  .  ONETOUCH VERIO test strip, 1 each by Other route 2 (two) times daily., Disp: , Rfl:  .  pantoprazole (PROTONIX) 40 MG tablet, Take 1 tablet (40 mg total) by mouth daily., Disp: 30 tablet, Rfl: 6 .  Propylene Glycol (SYSTANE COMPLETE) 0.6 % SOLN, Place 1 drop into both eyes every morning., Disp: , Rfl:  .  rosuvastatin (CRESTOR) 40 MG tablet, Take 1 tablet (40 mg total) by mouth at  bedtime., Disp: 90 tablet, Rfl: 3  Current Facility-Administered Medications:  .  0.9 %  sodium chloride infusion, 500 mL, Intravenous, Once, Milus Banister, MD  Facility-Administered Medications Ordered in Other Visits:  .  tranexamic acid (CYKLOKAPRON) 2,000 mg in sodium chloride 0.9 % 50 mL Topical Application, 0,623 mg, Topical, Once, Cecilio Asper, [employer], PA-C  Allergies  Allergen Reactions  . Atorvastatin Other (See Comments)    Muscle aches Other reaction(s): Other (See Comments) Muscle aches    Objective: Vitals:   04/02/19 1642  Temp: 98.2 F (36.8 C)    Vascular Examination: Capillary refill time immediate x 10 digits.  Dorsalis pedis pulses 1/4 b/l.  Posterior tibial pulses 1/4 b/l.  Digital hair absent x 10 digits.  Skin temperature gradient WNL b/l.  Dermatological Examination: Skin with normal turgor, texture and tone b/l.  Toenails 1-5 b/l discolored, thick, dystrophic with subungual debris and pain with palpation to nailbeds due to thickness of nails.  Hyperkeratotic lesion distal tip right hallux. No erythema, no edema, no drainage, no flocculence noted.   Musculoskeletal: Muscle strength 5/5 to all LE muscle groups.  HAV with bunion deformity b/l.  Hammertoes 2-5 b/l.  Neurological: Sensation intact 5/5 b/l with 10 gram monofilament.  Vibratory sensation intact b/l.  Assessment: 1. Painful onychomycosis toenails 1-5 b/l  2.  Callus distal tip right hallux 3.  NIDDM  Plan: 1. Continue diabetic foot care principles. Literature dispensed on today. 2. Toenails 1-5 b/l were debrided in length and girth without iatrogenic bleeding. 3. Hyperkeratotic lesion(s) distal tip right hallux pared with sterile scalpel blade without incident. 4. Patient to continue soft, supportive shoe gear daily. 5. Patient to report any pedal injuries to medical professional immediately. 6. Follow up 3 months.  7. Patient/POA to call should there be a concern in the  interim.

## 2019-04-26 ENCOUNTER — Telehealth: Payer: Self-pay | Admitting: Cardiology

## 2019-04-26 NOTE — Telephone Encounter (Signed)
Lm to call back ./cy 

## 2019-04-26 NOTE — Telephone Encounter (Signed)
Follow up ° ° °Patient is returning your call. Please call. ° ° ° °

## 2019-04-26 NOTE — Telephone Encounter (Signed)
Patient states she seen her PCP doctor- she states that she doesn't have the chest pain regularly, but that it may be a good idea to have the medication in case she does have them again. Patient had an EKG and it was fine, but she wants to mention it while she is thinking about it. Patient has appointment 09/22 with Dr.Crenshaw, advised that she should discuss this at the appointment patient just wanted to make him aware.

## 2019-04-26 NOTE — Telephone Encounter (Signed)
  Patient is calling because her PCP told her that she should ask about getting a prescription for Nitroglycerin since she has some chest pain at times.

## 2019-05-03 ENCOUNTER — Other Ambulatory Visit: Payer: Self-pay | Admitting: Family

## 2019-05-03 DIAGNOSIS — M25512 Pain in left shoulder: Secondary | ICD-10-CM

## 2019-05-15 ENCOUNTER — Telehealth: Payer: Medicare Other | Admitting: Cardiology

## 2019-05-16 ENCOUNTER — Other Ambulatory Visit: Payer: Self-pay | Admitting: Radiology

## 2019-05-18 ENCOUNTER — Other Ambulatory Visit: Payer: Self-pay

## 2019-05-18 ENCOUNTER — Ambulatory Visit
Admission: RE | Admit: 2019-05-18 | Discharge: 2019-05-18 | Disposition: A | Discharge status: Home / Self Care | Payer: Medicare Other | Source: Ambulatory Visit | Attending: Family | Admitting: Family

## 2019-05-18 DIAGNOSIS — M25512 Pain in left shoulder: Secondary | ICD-10-CM

## 2019-05-29 NOTE — Progress Notes (Signed)
Virtual Visit via Video Note   This visit type was conducted due to national recommendations for restrictions regarding the COVID-19 Pandemic (e.g. social distancing) in an effort to limit this patient's exposure and mitigate transmission in our community.  Due to her co-morbid illnesses, this patient is at least at moderate risk for complications without adequate follow up.  This format is felt to be most appropriate for this patient at this time.  All issues noted in this document were discussed and addressed.  A limited physical exam was performed with this format.  Please refer to the patient's chart for her consent to telehealth for [facility].   Date:  06/05/2019   ID:  [name], DOB [birth year], MRN [id number]  Patient Location:Home Provider Location: Home  PCP:  [name], FNP  Cardiologist:  Dr Stanford Breed  Evaluation Performed:  Follow-Up Visit  Chief Complaint:  FU CAD  History of Present Illness:    FU coronary artery disease. S/P CABG March 2012 with a LIMA to the LAD, a sequential saphenous vein graft to the diagonal and obtuse marginal and a saphenous vein graft to the acute marginal. Fu echo in August of 2012 showed an EF of 50-55, atrial septal aneurysm. Last nuclear study April 2019 showed ejection fraction 68% and normal perfusion. Patient followed by Dr. Gwenlyn Found for peripheral vascular disease. Multiple interventions previously. Had atherectomy of distal right SFA June 2019. Carotid Dopplers July 2019 showed 1 to 39% bilateral stenosis. ABIs August 2020 showed mild bilateral disease bilaterally. Since last seen,  she has some dyspnea on exertion but no orthopnea, PND, pedal edema, chest pain or syncope.  The patient does not have symptoms concerning for COVID-19 infection (fever, chills, cough, or new shortness of breath).    Past Medical History:  Diagnosis Date  . Anemia   . Anxiety    hx  . Arthritis    "my whole body"  . Atrial septal aneurysm    . COLONIC POLYPS, HX OF   . COPD (chronic obstructive pulmonary disease) ([place])    no per pt on 03/21/2014 & 02/06/2018  . Coronary artery disease    a. s/p CABG 2012.  Marland Kitchen DISC DISEASE, CERVICAL   . Gastroesophageal reflux disease   . Headache    "I have headaches all the time" (02/06/2018)  . Heart murmur    as a child  . History of rheumatic fever   . Hyperlipidemia   . Hypertension   . Hypothyroidism   . Insomnia   . Osteoarthritis   . OSTEOPENIA   . PAD (peripheral artery disease) ([place])    a. PTA to R SFA 2015. b. PTA to L SFA 05/2016.  . Pre-diabetes   . RSD (reflex sympathetic dystrophy) 10/31/2011  . Vitamin D deficiency    Past Surgical History:  Procedure Laterality Date  . ABDOMINAL HYSTERECTOMY  1977   "partial"  . BALLOON ANGIOPLASTY, ARTERY Right 03/21/2014   SFA  . CARDIAC CATHETERIZATION  2011   . CARPAL TUNNEL RELEASE Right 2005  . CATARACT EXTRACTION Left 2013  . CORONARY ARTERY BYPASS GRAFT  10/2009   LIMA to the LAD, saphenous vein graft to the acute marginal, saphenous vein graft to the diagonal and obtuse marginal.  . DILATION AND CURETTAGE OF UTERUS  1978  . JOINT REPLACEMENT    . LOWER EXTREMITY ANGIOGRAM N/A 03/21/2014   Procedure: LOWER EXTREMITY ANGIOGRAM;  Surgeon: Lorretta Harp, MD;  Location: [facility] CATH LAB;  Service:  Cardiovascular;  Laterality: N/A;  . LOWER EXTREMITY ANGIOGRAM  06/07/2016   Abdominal aortogram/bilateral iliac angiogram/bifemoral runoff  . LOWER EXTREMITY ANGIOGRAPHY N/A 02/06/2018   Procedure: LOWER EXTREMITY ANGIOGRAPHY;  Surgeon: Lorretta Harp, MD;  Location: [place] CV LAB;  Service: Cardiovascular;  Laterality: N/A;  . PERIPHERAL VASCULAR ATHERECTOMY  02/06/2018   Procedure: PERIPHERAL VASCULAR ATHERECTOMY;  Surgeon: Lorretta Harp, MD;  Location: [place] CV LAB;  Service: Cardiovascular;;  . PERIPHERAL VASCULAR CATHETERIZATION Bilateral 06/07/2016   Procedure: Lower Extremity Angiography;  Surgeon: Lorretta Harp, MD;  Location: [place] CV LAB;  Service: Cardiovascular;  Laterality: Bilateral;  . PERIPHERAL VASCULAR CATHETERIZATION Left 06/07/2016   Procedure: Peripheral Vascular Atherectomy;  Surgeon: Lorretta Harp, MD;  Location: [place] CV LAB;  Service: Cardiovascular;  Laterality: Left;  SFA  . PERIPHERAL VASCULAR CATHETERIZATION Left 06/07/2016   Procedure: Peripheral Vascular Balloon Angioplasty;  Surgeon: Lorretta Harp, MD;  Location: [place] CV LAB;  Service: Cardiovascular;  Laterality: Left;  SFA  . THYROIDECTOMY  1995  . TONSILLECTOMY    . TOTAL KNEE ARTHROPLASTY Right 06/18/2014   Procedure: RIGHT TOTAL KNEE ARTHROPLASTY;  Surgeon: Tobi Bastos, MD;  Location: [facility] ORS;  Service: Orthopedics;  Laterality: Right;     Current Meds  Medication Sig  . albuterol (VENTOLIN HFA) 108 (90 Base) MCG/ACT inhaler INHALE 1 PUFF BY MOUTH EVERY 6 HOURS AS NEEDED FOR SHORTNESS OF BREATH AND FOR WHEEZING  . alclomethasone (ACLOVATE) 0.05 % cream Apply 1 application topically as needed.   Marland Kitchen amLODipine (NORVASC) 10 MG tablet TAKE ONE TABLET BY MOUTH ONCE DAILY  . aspirin EC 81 MG tablet Take 81 mg by mouth daily.  . Calcium Carb-Cholecalciferol (CALCIUM 1000 + D PO) Take 1 tablet by mouth every other day.   . clopidogrel (PLAVIX) 75 MG tablet Take 1 tablet (75 mg total) by mouth daily with breakfast.  . co-enzyme Q-10 30 MG capsule Take 30 mg by mouth as needed.   . CONSTULOSE 10 GM/15ML solution TAKE 30 ML BY MOUTH TWICE DAILY AS NEEDED  . cyclobenzaprine (FLEXERIL) 5 MG tablet TAKE 1 TABLET BY MOUTH TWICE DAILY AS NEEDED FOR MUSCLE SPASM  . Lancets (ONETOUCH DELICA PLUS [id number]) MISC 1 each by Other route 2 (two) times daily.  Marland Kitchen levothyroxine (SYNTHROID, LEVOTHROID) 150 MCG tablet Take 150 mcg by mouth daily before breakfast.  . Multiple Vitamin (MULTIVITAMIN WITH MINERALS) TABS tablet Take 1 tablet by mouth every morning. Centrum  . ONETOUCH VERIO test strip 1 each by Other  route 2 (two) times daily.  . pantoprazole (PROTONIX) 40 MG tablet Take 1 tablet (40 mg total) by mouth daily.  Marland Kitchen Propylene Glycol (SYSTANE COMPLETE) 0.6 % SOLN Place 1 drop into both eyes every morning.   Current Facility-Administered Medications for the 06/05/19 encounter (Telemedicine) with Lelon Perla, MD  Medication  . 0.9 %  sodium chloride infusion     Allergies:   Atorvastatin   Social History   Tobacco Use  . Smoking status: Former Smoker    Packs/day: 0.80    Years: 45.00    Pack years: 36.00    Types: Cigarettes    Quit date: 08/23/2009    Years since quitting: 9.7  . Smokeless tobacco: Never Used  Substance Use Topics  . Alcohol use: Yes    Alcohol/week: 2.0 standard drinks    Types: 2 Glasses of wine per week  . Drug use: Never     Family  Hx: The patient's family history includes Arthritis in her maternal aunt; Diabetes in her maternal aunt; Heart disease in her maternal aunt; Hypertension in her maternal aunt. There is no history of Colon cancer, Kidney disease, Liver disease, Throat cancer, Stomach cancer, or Colon polyps.  ROS:   Please see the history of present illness.    No Fever, chills  or productive cough; some arthralgias. All other systems reviewed and are negative.  Recent Lipid Panel Lab Results  Component Value Date/Time   CHOL 112 03/09/2018 09:08 AM   TRIG 72 03/09/2018 09:08 AM   HDL 52 03/09/2018 09:08 AM   CHOLHDL 2.2 03/09/2018 09:08 AM   CHOLHDL 2 10/15/2015 11:06 AM   LDLCALC 46 03/09/2018 09:08 AM   LDLDIRECT 126.5 11/10/2012 08:26 AM    Wt Readings from Last 3 Encounters:  06/05/19 132 lb (59.9 kg)  03/09/19 134 lb (60.8 kg)  02/07/19 136 lb (61.7 kg)     Objective:    Vital Signs:  BP 132/78 Comment: Last Friday at her PCP.  Ht 5\' 3"  (1.6 m)   Wt 132 lb (59.9 kg)   BMI 23.38 kg/m    VITAL SIGNS:  reviewed NAD Answers questions appropriately Normal affect Remainder of physical examination not performed  (telehealth visit; coronavirus pandemic)  ASSESSMENT & PLAN:    1. Coronary artery disease status post coronary artery bypass and graft-no recurrent chest pain.  Continue medical therapy with aspirin and statin. 2. Hypertension-blood pressure controlled.  Continue present medications and follow.  Check potassium and renal function. 3. Hyperlipidemia-patient is only taking Crestor 3 times weekly.  I asked her to take this daily.  Check lipids and liver in 12 weeks. 4. Carotid artery disease-mild on most recent Dopplers.  Continue medical therapy. 5. Peripheral vascular disease-no claudication.  Continue medical therapy.  COVID-19 Education: The importance of social distancing was discussed today.  Time:   Today, I have spent 18 minutes with the patient with telehealth technology discussing the above problems.     Medication Adjustments/Labs and Tests Ordered: Current medicines are reviewed at length with the patient today.  Concerns regarding medicines are outlined above.   Tests Ordered: No orders of the defined types were placed in this encounter.   Medication Changes: No orders of the defined types were placed in this encounter.   Follow Up:  Either In Person or Virtual Visit in 1 year(s)  Signed, Kirk Ruths, MD  06/05/2019 8:44 AM    [place]

## 2019-05-30 ENCOUNTER — Telehealth: Payer: Self-pay | Admitting: Cardiovascular Disease

## 2019-05-30 NOTE — Telephone Encounter (Signed)
New Message       [facility] Pre-operative Risk Assessment    Request for surgical clearance:  1. What type of surgery is being performed? Dental Cleaning  2. When is this surgery scheduled? TBD  3. What type of clearance is required (medical clearance vs. Pharmacy clearance to hold med vs. Both)? Medical  4. Are there any medications that need to be held prior to surgery and how long? Any blood thinners  5. Practice name and name of physician performing surgery? [facility]  6. What is your office phone number [phone]    7.   What is your office fax number [phone]  8.   Anesthesia type (None, local, MAC, general) ? None   Andree Coss 05/30/2019, 3:30 PM  _________________________________________________________________   (provider comments below)

## 2019-05-31 ENCOUNTER — Encounter: Payer: Self-pay | Admitting: Orthopaedic Surgery

## 2019-05-31 ENCOUNTER — Ambulatory Visit (INDEPENDENT_AMBULATORY_CARE_PROVIDER_SITE_OTHER): Payer: Medicare Other | Admitting: Orthopaedic Surgery

## 2019-05-31 DIAGNOSIS — M19012 Primary osteoarthritis, left shoulder: Secondary | ICD-10-CM | POA: Diagnosis not present

## 2019-05-31 NOTE — Progress Notes (Signed)
Subjective: Patient is here for ultrasound-guided intra-articular left glenohumeral injection.  Has GH DJD and cuff tendinopathy.  [name] is tomorrow.  Objective:  Pain and decreased ROM.  Procedure: Ultrasound-guided left glenohumeral injection: After sterile prep with Betadine, injected 8 cc 1% lidocaine without epinephrine and 40 mg methylprednisolone using a 22-gauge spinal needle, passing the needle through approach into the glenohumeral joint.  Injectate seen filling joint capsule.  Return with Dr. Erlinda Hong as directed.

## 2019-05-31 NOTE — Addendum Note (Signed)
Addended by: Hortencia Pilar on: 05/31/2019 09:39 AM   Modules accepted: Orders

## 2019-05-31 NOTE — Progress Notes (Signed)
Office Visit Note   Patient: [name]           Date of Birth: 10/04/1945           MRN: [id number] Visit Date: 05/31/2019              Requested by: Alicia Side., FNP [place],  [place] [address] PCP: Alicia Side., FNP   Assessment & Plan: Visit Diagnoses:  1. Primary osteoarthritis, left shoulder     Plan: Impression is left glenohumeral osteoarthritis and rotator cuff tendinosis.  Patient is agreeable to receiving a glenohumeral joint injection today with Dr. Junius [name].  We will see the patient back as needed.  Follow-Up Instructions: Return if symptoms worsen or fail to improve.   Orders:  No orders of the defined types were placed in this encounter.  No orders of the defined types were placed in this encounter.     Procedures: No procedures performed   Clinical Data: No additional findings.   Subjective: Chief Complaint  Patient presents with  . Left Shoulder - Pain    [name] is a very pleasant 73 year old female comes in for left shoulder pain for several months.  She had an MRI recently.  Her PCP Dr. Tamala [name] has referred her to [place] for further evaluation and treatment.  She is right-handed.  She states that she has some limitation of range of motion secondary to the pain.   Review of Systems  Constitutional: Negative.   HENT: Negative.   Eyes: Negative.   Respiratory: Negative.   Cardiovascular: Negative.   Endocrine: Negative.   Musculoskeletal: Negative.   Neurological: Negative.   Hematological: Negative.   Psychiatric/Behavioral: Negative.   All other systems reviewed and are negative.    Objective: Vital Signs: There were no vitals taken for this visit.  Physical Exam Vitals signs and nursing note reviewed.  Constitutional:      Appearance: She is well-developed.  HENT:     Head: Normocephalic and atraumatic.  Neck:     Musculoskeletal: Neck supple.  Pulmonary:     Effort: Pulmonary effort is normal.   Abdominal:     Palpations: Abdomen is soft.  Skin:    General: Skin is warm.     Capillary Refill: Capillary refill takes less than 2 seconds.  Neurological:     Mental Status: She is alert and oriented to person, place, and time.  Psychiatric:        Behavior: Behavior normal.        Thought Content: Thought content normal.        Judgment: Judgment normal.     Ortho Exam Left shoulder exam shows good range of motion with moderate pain.  Rotator cuff is essentially normal to manual muscle testing.  Denies any radicular symptoms Specialty Comments:  No specialty comments available.  Imaging: No results found.   PMFS History: Patient Active Problem List   Diagnosis Date Noted  . Status post partial hysterectomy 10/24/2018  . Discharge from the vagina 10/24/2018  . Claudication in peripheral vascular disease ([place]) 02/06/2018  . Chronic idiopathic constipation 10/07/2017  . CAD in native artery 06/08/2016  . Skin inflammation 04/06/2016  . Contracture of hand joint 04/06/2016  . Microhematuria 11/12/2015  . Left shoulder pain 10/15/2015  . Trapezius muscle spasm 09/24/2015  . Type 2 diabetes mellitus ([place]) 08/07/2015  . Urinary frequency 04/04/2015  . External hemorrhoid 11/02/2014  . Left-sided thoracic back pain 10/03/2014  .  Gingivitis 10/03/2014  . Routine general medical examination at a health care facility 09/10/2014  . Total knee replacement status 06/18/2014  . Tooth pain 04/18/2014  . Claudication ([place]) 03/21/2014  . Primary localized osteoarthrosis, lower leg 08/10/2013  . Right knee pain 08/02/2013  . Upper respiratory infection 08/02/2013  . Dyspepsia 11/16/2012  . Knee pain, right 02/18/2012  . PAD (peripheral artery disease) ([place]) 12/03/2011  . PVD (peripheral vascular disease) ([place]) 10/31/2011  . RSD (reflex sympathetic dystrophy) 10/31/2011  . Vitamin D deficiency   . Preop exam for internal medicine 10/28/2011  . Anxiety 03/11/2011  . COPD  (chronic obstructive pulmonary disease) ([place]) 02/02/2011  . Anemia 02/02/2011  . History of rheumatic fever 02/02/2011  . Insomnia 02/02/2011  . Coronary atherosclerosis 09/08/2010  . CONSTIPATION 02/19/2010  . TOBACCO ABUSE 07/21/2009  . MURMUR 06/17/2009  . [place] DISEASE, CERVICAL 03/20/2009  . Pain in Soft Tissues of Limb 05/23/2008  . Hypothyroidism 09/07/2007  . Hyperlipidemia LDL goal <70 09/07/2007  . Essential hypertension 09/07/2007  . ALLERGIC RHINITIS 09/07/2007  . GERD 09/07/2007  . Primary osteoarthritis of right knee 09/07/2007  . Osteopenia 09/07/2007  . COLONIC POLYPS, HX OF 09/07/2007   Past Medical History:  Diagnosis Date  . Anemia   . Anxiety    hx  . Arthritis    "my whole body"  . Atrial septal aneurysm   . COLONIC POLYPS, HX OF   . COPD (chronic obstructive pulmonary disease) ([place])    no per pt on 03/21/2014 & 02/06/2018  . Coronary artery disease    a. s/p CABG 2012.  [place] DISC DISEASE, CERVICAL   . Gastroesophageal reflux disease   . Headache    "I have headaches all the time" (02/06/2018)  . Heart murmur    as a child  . History of rheumatic fever   . Hyperlipidemia   . Hypertension   . Hypothyroidism   . Insomnia   . Osteoarthritis   . OSTEOPENIA   . PAD (peripheral artery disease) ([place])    a. PTA to R SFA 2015. b. PTA to L SFA 05/2016.  . Pre-diabetes   . RSD (reflex sympathetic dystrophy) 10/31/2011  . Vitamin D deficiency     Family History  Problem Relation Age of Onset  . Arthritis Maternal Aunt   . Heart disease Maternal Aunt   . Hypertension Maternal Aunt   . Diabetes Maternal Aunt   . Colon cancer Neg Hx   . Kidney disease Neg Hx   . Liver disease Neg Hx   . Throat cancer Neg Hx   . Stomach cancer Neg Hx   . Colon polyps Neg Hx     Past Surgical History:  Procedure Laterality Date  . ABDOMINAL HYSTERECTOMY  1977   "partial"  . BALLOON ANGIOPLASTY, ARTERY Right 03/21/2014   SFA  . CARDIAC CATHETERIZATION  2011   . CARPAL  TUNNEL RELEASE Right 2005  . CATARACT EXTRACTION Left 2013  . CORONARY ARTERY BYPASS GRAFT  10/2009   LIMA to the LAD, saphenous vein graft to the acute marginal, saphenous vein graft to the diagonal and obtuse marginal.  . DILATION AND CURETTAGE OF UTERUS  1978  . JOINT REPLACEMENT    . LOWER EXTREMITY ANGIOGRAM N/A 03/21/2014   Procedure: LOWER EXTREMITY ANGIOGRAM;  Surgeon: Lorretta Harp, MD;  Location: [facility] CATH LAB;  Service: Cardiovascular;  Laterality: N/A;  . LOWER EXTREMITY ANGIOGRAM  06/07/2016   Abdominal aortogram/bilateral iliac angiogram/bifemoral runoff  . LOWER  EXTREMITY ANGIOGRAPHY N/A 02/06/2018   Procedure: LOWER EXTREMITY ANGIOGRAPHY;  Surgeon: Lorretta Harp, MD;  Location: [facility];  Service: Cardiovascular;  Laterality: N/A;  . PERIPHERAL VASCULAR ATHERECTOMY  02/06/2018   Procedure: PERIPHERAL VASCULAR ATHERECTOMY;  Surgeon: Lorretta Harp, MD;  Location: [facility];  Service: Cardiovascular;;  . PERIPHERAL VASCULAR CATHETERIZATION Bilateral 06/07/2016   Procedure: Lower Extremity Angiography;  Surgeon: Lorretta Harp, MD;  Location: [facility];  Service: Cardiovascular;  Laterality: Bilateral;  . PERIPHERAL VASCULAR CATHETERIZATION Left 06/07/2016   Procedure: Peripheral Vascular Atherectomy;  Surgeon: Lorretta Harp, MD;  Location: [facility];  Service: Cardiovascular;  Laterality: Left;  SFA  . PERIPHERAL VASCULAR CATHETERIZATION Left 06/07/2016   Procedure: Peripheral Vascular Balloon Angioplasty;  Surgeon: Lorretta Harp, MD;  Location: [facility];  Service: Cardiovascular;  Laterality: Left;  SFA  . THYROIDECTOMY  1995  . TONSILLECTOMY    . TOTAL KNEE ARTHROPLASTY Right 06/18/2014   Procedure: RIGHT TOTAL KNEE ARTHROPLASTY;  Surgeon: Tobi Bastos, MD;  Location: [facility];  Service: Orthopedics;  Laterality: Right;   Social History   Occupational History  . Occupation: Retired    Fish farm manager: RETIRED  Tobacco  Use  . Smoking status: Former Smoker    Packs/day: 0.80    Years: 45.00    Pack years: 36.00    Types: Cigarettes    Quit date: 08/23/2009    Years since quitting: 9.7  . Smokeless tobacco: Never Used  Substance and Sexual Activity  . Alcohol use: Yes    Alcohol/week: 2.0 standard drinks    Types: 2 Glasses of wine per week  . Drug use: Never  . Sexual activity: Not Currently

## 2019-05-31 NOTE — Telephone Encounter (Signed)
   Primary Cardiologist: Quay Burow, MD  Chart reviewed as part of pre-operative protocol coverage. Simple dental extractions (2 or less) are considered low risk procedures per guidelines and generally do not require any specific cardiac clearance. It is also generally accepted that for simple extractions and dental cleanings, there is no need to interrupt blood thinner therapy.   Per clearance request, the patient will be undergoing simple dental cleaning. If more invasive procedure, please re-contact our office for further recommendations.   SBE prophylaxis is not required for the patient.  I will route this recommendation to the requesting party via Epic fax function and remove from pre-op pool.  Please call with questions.  Kathyrn Drown, NP 05/31/2019, 7:54 AM

## 2019-06-05 ENCOUNTER — Encounter

## 2019-06-05 ENCOUNTER — Ambulatory Visit (INDEPENDENT_AMBULATORY_CARE_PROVIDER_SITE_OTHER): Payer: Medicare Other | Admitting: Cardiology

## 2019-06-05 VITALS — BP 132/78 | Ht 63.0 in | Wt 132.0 lb

## 2019-06-05 DIAGNOSIS — I739 Peripheral vascular disease, unspecified: Secondary | ICD-10-CM | POA: Diagnosis not present

## 2019-06-05 DIAGNOSIS — I2510 Atherosclerotic heart disease of native coronary artery without angina pectoris: Secondary | ICD-10-CM

## 2019-06-05 DIAGNOSIS — I10 Essential (primary) hypertension: Secondary | ICD-10-CM

## 2019-06-05 DIAGNOSIS — E7800 Pure hypercholesterolemia, unspecified: Secondary | ICD-10-CM | POA: Diagnosis not present

## 2019-06-05 NOTE — Patient Instructions (Addendum)
Medication Instructions:  Take Crestor ( Rosuvastatin ) 20 mg daily after dinner Continue all other medications If you need a refill on your cardiac medications before your next appointment, please call your pharmacy.   Lab work: Fasting bmet,lipid and hepatic panels in 3 months  Lab orders enclosed   Testing/Procedures: None ordered  Follow-Up: At [facility], you and your health needs are our priority.  As part of our continuing mission to provide you with exceptional heart care, we have created designated Provider Care Teams.  These Care Teams include your primary Cardiologist (physician) and Advanced Practice Providers (APPs -  Physician Assistants and Nurse Practitioners) who all work together to provide you with the care you need, when you need it. . Schedule follow up appointment in 1 year    Call in July to schedule Oct appointment

## 2019-06-06 DIAGNOSIS — I2510 Atherosclerotic heart disease of native coronary artery without angina pectoris: Secondary | ICD-10-CM

## 2019-06-06 DIAGNOSIS — I739 Peripheral vascular disease, unspecified: Secondary | ICD-10-CM

## 2019-06-06 DIAGNOSIS — I10 Essential (primary) hypertension: Secondary | ICD-10-CM

## 2019-06-06 DIAGNOSIS — E7800 Pure hypercholesterolemia, unspecified: Secondary | ICD-10-CM

## 2019-07-02 ENCOUNTER — Encounter: Payer: Self-pay | Admitting: Podiatry

## 2019-07-02 ENCOUNTER — Ambulatory Visit: Payer: Medicare Other | Admitting: Podiatry

## 2019-07-02 ENCOUNTER — Other Ambulatory Visit: Payer: Self-pay

## 2019-07-02 DIAGNOSIS — I70201 Unspecified atherosclerosis of native arteries of extremities, right leg: Secondary | ICD-10-CM | POA: Diagnosis not present

## 2019-07-02 DIAGNOSIS — B351 Tinea unguium: Secondary | ICD-10-CM

## 2019-07-02 DIAGNOSIS — I70209 Unspecified atherosclerosis of native arteries of extremities, unspecified extremity: Secondary | ICD-10-CM | POA: Diagnosis not present

## 2019-07-02 DIAGNOSIS — E1151 Type 2 diabetes mellitus with diabetic peripheral angiopathy without gangrene: Secondary | ICD-10-CM | POA: Diagnosis not present

## 2019-07-02 DIAGNOSIS — M79676 Pain in unspecified toe(s): Secondary | ICD-10-CM

## 2019-07-02 DIAGNOSIS — L84 Corns and callosities: Secondary | ICD-10-CM

## 2019-07-02 NOTE — Patient Instructions (Signed)
Diabetes Mellitus and Foot Care Foot care is an important part of your health, especially when you have diabetes. Diabetes may cause you to have problems because of poor blood flow (circulation) to your feet and legs, which can cause your skin to:  Become thinner and drier.  Break more easily.  Heal more slowly.  Peel and crack. You may also have nerve damage (neuropathy) in your legs and feet, causing decreased feeling in them. This means that you may not notice minor injuries to your feet that could lead to more serious problems. Noticing and addressing any potential problems early is the best way to prevent future foot problems. How to care for your feet Foot hygiene  Wash your feet daily with warm water and mild soap. Do not use hot water. Then, pat your feet and the areas between your toes until they are completely dry. Do not soak your feet as this can dry your skin.  Trim your toenails straight across. Do not dig under them or around the cuticle. File the edges of your nails with an emery board or nail file.  Apply a moisturizing lotion or petroleum jelly to the skin on your feet and to dry, brittle toenails. Use lotion that does not contain alcohol and is unscented. Do not apply lotion between your toes. Shoes and socks  Wear clean socks or stockings every day. Make sure they are not too tight. Do not wear knee-high stockings since they may decrease blood flow to your legs.  Wear shoes that fit properly and have enough cushioning. Always look in your shoes before you put them on to be sure there are no objects inside.  To break in new shoes, wear them for just a few hours a day. This prevents injuries on your feet. Wounds, scrapes, corns, and calluses  Check your feet daily for blisters, cuts, bruises, sores, and redness. If you cannot see the bottom of your feet, use a mirror or ask someone for help.  Do not cut corns or calluses or try to remove them with medicine.  If you  find a minor scrape, cut, or break in the skin on your feet, keep it and the skin around it clean and dry. You may clean these areas with mild soap and water. Do not clean the area with peroxide, alcohol, or iodine.  If you have a wound, scrape, corn, or callus on your foot, look at it several times a day to make sure it is healing and not infected. Check for: ? Redness, swelling, or pain. ? Fluid or blood. ? Warmth. ? Pus or a bad smell. General instructions  Do not cross your legs. This may decrease blood flow to your feet.  Do not use heating pads or hot water bottles on your feet. They may burn your skin. If you have lost feeling in your feet or legs, you may not know this is happening until it is too late.  Protect your feet from hot and cold by wearing shoes, such as at the beach or on hot pavement.  Schedule a complete foot exam at least once a year (annually) or more often if you have foot problems. If you have foot problems, report any cuts, sores, or bruises to your health care provider immediately. Contact a health care provider if:  You have a medical condition that increases your risk of infection and you have any cuts, sores, or bruises on your feet.  You have an injury that is not   healing.  You have redness on your legs or feet.  You feel burning or tingling in your legs or feet.  You have pain or cramps in your legs and feet.  Your legs or feet are numb.  Your feet always feel cold.  You have pain around a toenail. Get help right away if:  You have a wound, scrape, corn, or callus on your foot and: ? You have pain, swelling, or redness that gets worse. ? You have fluid or blood coming from the wound, scrape, corn, or callus. ? Your wound, scrape, corn, or callus feels warm to the touch. ? You have pus or a bad smell coming from the wound, scrape, corn, or callus. ? You have a fever. ? You have a red line going up your leg. Summary  Check your feet every day  for cuts, sores, red spots, swelling, and blisters.  Moisturize feet and legs daily.  Wear shoes that fit properly and have enough cushioning.  If you have foot problems, report any cuts, sores, or bruises to your health care provider immediately.  Schedule a complete foot exam at least once a year (annually) or more often if you have foot problems. This information is not intended to replace advice given to you by your health care provider. Make sure you discuss any questions you have with your health care provider. Document Released: 08/06/2000 Document Revised: 09/21/2017 Document Reviewed: 09/10/2016 Elsevier Patient Education  2020 [employer]  

## 2019-07-04 NOTE — Progress Notes (Signed)
Subjective: [name] is a 73 y.o. y.o. female who presents today with cc of painful, discolored, thick toenails and painful callus/corn which interfere with daily activities. Pain is aggravated when wearing enclosed shoe gear and relieved with periodic professional debridement.  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 She is followed by Dr. Gwenlyn Found.  Current Outpatient Medications on File Prior to Visit  Medication Sig Dispense Refill  . albuterol (VENTOLIN HFA) 108 (90 Base) MCG/ACT inhaler INHALE 1 PUFF BY MOUTH EVERY 6 HOURS AS NEEDED FOR SHORTNESS OF BREATH AND FOR WHEEZING    . alclomethasone (ACLOVATE) 0.05 % cream Apply 1 application topically as needed.     Marland Kitchen amLODipine (NORVASC) 10 MG tablet TAKE ONE TABLET BY MOUTH ONCE DAILY 90 tablet 1  . aspirin EC 81 MG tablet Take 81 mg by mouth daily.    . Calcium Carb-Cholecalciferol (CALCIUM 1000 + D PO) Take 1 tablet by mouth every other day.     . clopidogrel (PLAVIX) 75 MG tablet Take 1 tablet (75 mg total) by mouth daily with breakfast. 90 tablet 3  . co-enzyme Q-10 30 MG capsule Take 30 mg by mouth as needed.     . CONSTULOSE 10 GM/15ML solution TAKE 30 ML BY MOUTH TWICE DAILY AS NEEDED    . cyclobenzaprine (FLEXERIL) 5 MG tablet TAKE 1 TABLET BY MOUTH TWICE DAILY AS NEEDED FOR MUSCLE SPASM    . Lancets (ONETOUCH DELICA PLUS [id number]) MISC 1 each by Other route 2 (two) times daily.    Marland Kitchen levothyroxine (SYNTHROID, LEVOTHROID) 150 MCG tablet Take 150 mcg by mouth daily before breakfast.    . Multiple Vitamin (MULTIVITAMIN WITH MINERALS) TABS tablet Take 1 tablet by mouth every morning. Centrum    . ONETOUCH VERIO test strip 1 each by Other route 2 (two) times daily.    . pantoprazole (PROTONIX) 40 MG tablet Take 1 tablet (40 mg total) by mouth daily. 30 tablet 6  . Propylene Glycol (SYSTANE COMPLETE) 0.6 % SOLN Place 1 drop into both eyes every morning.    . rosuvastatin (CRESTOR) 20 MG tablet Take 1 tablet (20 mg  total) by mouth daily. 90 tablet 3   Current Facility-Administered Medications on File Prior to Visit  Medication Dose Route Frequency Provider Last Rate Last Dose  . 0.9 %  sodium chloride infusion  500 mL Intravenous Once Milus Banister, MD      . tranexamic acid (CYKLOKAPRON) 2,000 mg in sodium chloride 0.9 % 50 mL Topical Application  4,742 mg Topical Once Ardeen Jourdain, PA-C        Allergies  Allergen Reactions  . Atorvastatin Other (See Comments)    Muscle aches Other reaction(s): Other (See Comments) Muscle aches    Objective: There were no vitals filed for this visit.  Vascular Examination: Capillary refill time immediate b/l.   Dorsalis pedis pulses faintly palpable b/l.  Posterior tibial pulses faintly palpable b/l.  Digital hair absent b/l.   Skin temperature gradient WNL b/l.  Dermatological Examination: Skin with normal turgor, texture and tone b/l.  Toenails 1-5 b/l discolored, thick, dystrophic with subungual debris and pain with palpation to nailbeds due to thickness of nails.  Hyperkeratotic lesion distal tip right hallux. No erythema, no edema, no drainage, no flocculence noted.   Musculoskeletal: Muscle strength 5/5 to all LE muscle groups b/l.  HAV with bunion deformity b/l. Hammertoes 2-5 b/l.  Neurological: Sensation intact 5/5 b/l with 10 gram monofilament.  Vibratory sensation intact b/l.  LE ARTERIAL 03/27/2019 [place] [facility] [address] [facility]  Results  Procedure Component Value Ref Range Date/Time  VAS [place] LOWER EXTREMITY ARTERIAL DUPLEX [[id number]] Collected: 03/27/19 1335  Order Status: Completed Updated: 03/29/19 1617  Narrative:   LOWER EXTREMITY ARTERIAL DUPLEX STUDY   Indications: Peripheral artery disease, and SEE ABI REPORT.   High Risk Factors: Hypertension, Diabetes, coronary artery disease.   Other Factors: COPD; s/p CABG with LIMA.  Vascular Interventions: S/p 7/15 atherectomy and  drug-coated balloon angioplasty             of a right SFA CTO. Left SFA intervention 06/07/2016 of             the mid left SFA. Her most recent procedure was             02/06/2018 at which time she had atherectomy of her             entire right SFA for CTO.  Current ABI:      Right .90, left .93.   Comparison    02/20/2018 showed stenosis in the proximal SFA, velocity 230  Study:      cm/sec.   Performing Technologist: Sharlett Iles RVT     Examination Guidelines: A complete evaluation includes B-mode imaging, spectral  Doppler, color Doppler, and power Doppler as needed of all accessible portions  of each vessel. Bilateral testing is considered an integral part of a complete  examination. Limited examinations for reoccurring indications may be performed  as noted.     +----------+--------+-----+---------------+--------+---------+  RIGHT   PSV cm/sRatioStenosis    WaveformComments   +----------+--------+-----+---------------+--------+---------+  CFA Prox 148              biphasic       +----------+--------+-----+---------------+--------+---------+  CFA Mid  103              biphasic       +----------+--------+-----+---------------+--------+---------+  CFA Distal68              biphasic       +----------+--------+-----+---------------+--------+---------+  DFA    221              biphasic       +----------+--------+-----+---------------+--------+---------+  SFA Prox 104      50-74% stenosisbiphasicSee below  +----------+--------+-----+---------------+--------+---------+  SFA Mid  134              biphasic       +----------+--------+-----+---------------+--------+---------+  SFA Distal42              biphasic        +----------+--------+-----+---------------+--------+---------+  POP Prox 53              biphasic       +----------+--------+-----+---------------+--------+---------+  POP Distal68              biphasic       +----------+--------+-----+---------------+--------+---------+  TP Trunk 46              biphasic       +----------+--------+-----+---------------+--------+---------+   A focal velocity elevation of 227 cm/s was obtained at proximal SFA with a VR of 4.3. Findings are characteristic of 50-74% stenosis. Atherosclerosis throughout the SFA, with focal stenosis in the proximal SFA. Velocity is stable, but the velocity ratio  has increased slightly, compared to previous.     Summary:  Right: 50-74% stenosis noted in the superficial femoral artery. Atherosclerosis throughout the SFA, with essentially stable stenosis in the proximal SFA.  See table(s) above for measurements and observations.   Suggest follow up bilateral duplex + ABI 1 year    Electronically signed by Carlyle Dolly MD on 03/29/2019 at 4:17:20 PM.    Final   VAS [place] ABI WITH/WO TBI [[id number]] Collected: 03/27/19 1303  Order Status: Completed Updated: 03/29/19 1047  Narrative:    LOWER EXTREMITY DOPPLER STUDY   Indications: Peripheral artery disease.   High Risk Factors: Hypertension, hyperlipidemia, Diabetes, past history of           smoking, coronary artery disease.   Other Factors: S/p CABG with LIMA.  Vascular Interventions: S/p 7/15 atherectomy and drug-coated balloon angioplasty             of a right SFA CTO. Left SFA intervention 06/07/2016 of             the mid left SFA. Her most recent procedure was             02/06/2018 at which time she had atherectomy of her             entire right SFA for CTO.   Performing Technologist: Sharlett Iles RVT      Examination Guidelines: A complete evaluation includes at minimum, Doppler  waveform signals and systolic blood pressure reading at the level of bilateral  brachial, anterior tibial, and posterior tibial arteries, when vessel segments  are accessible. Bilateral testing is considered an integral part of a complete  examination. Photoelectric Plethysmograph (PPG) waveforms and toe systolic  pressure readings are included as required and additional duplex testing as  needed. Limited examinations for reoccurring indications may be performed as  noted.     ABI Findings:  +---------+------------------+-----+--------+--------+  Right  Rt Pressure (mmHg)IndexWaveformComment   +---------+------------------+-----+--------+--------+  Brachial 153                     +---------+------------------+-----+--------+--------+  ATA   135        0.88           +---------+------------------+-----+--------+--------+  PTA   137        0.90           +---------+------------------+-----+--------+--------+  PERO   131        0.86           +---------+------------------+-----+--------+--------+  Great Toe108        0.71           +---------+------------------+-----+--------+--------+   +---------+------------------+-----+--------+-------+  Left   Lt Pressure (mmHg)IndexWaveformComment  +---------+------------------+-----+--------+-------+  Brachial 153                     +---------+------------------+-----+--------+-------+  ATA   124        0.81           +---------+------------------+-----+--------+-------+  PTA   143        0.93           +---------+------------------+-----+--------+-------+  PERO   123        0.80           +---------+------------------+-----+--------+-------+   Great Toe101        0.66           +---------+------------------+-----+--------+-------+   +-------+-----------+-----------+------------+------------+  ABI/TBIToday's ABIToday's TBIPrevious ABIPrevious TBI  +-------+-----------+-----------+------------+------------+  Right .90    .71    .82     .65       +-------+-----------+-----------+------------+------------+  Left  .93    .66    .84     .  22       +-------+-----------+-----------+------------+------------+   Bilateral ABIs and TBIs appear increased compared to prior study on 02/20/2018.    Summary:  Right: Resting right ankle-brachial index indicates mild right lower extremity arterial disease. The right toe-brachial index is normal.   Left: Resting left ankle-brachial index indicates mild left lower extremity arterial disease. The left toe-brachial index is abnormal.    *See table(s) above for measurements and observations.    Suggest follow up study in 12 months.   Electronically signed by Carlyle Dolly MD on 03/29/2019 at 10:47:01 AM.      Assessment: 1. Painful onychomycosis toenails 1-5 b/l 2.  Callus distal tip right great toe 3.  NIDDM  4.  Atherosclerosis with claudication  Plan: 1. Continue diabetic foot care principles. Literature dispensed on today. 2. Toenails 1-5 b/l were debrided in length and girth without iatrogenic bleeding. 3. Calluses pared right hallux utilizing sterile scalpel blade without incident. 4. Hyperkeratotic lesion(s) pared with sterile scalpel blade without incident. 5. Patient to report any pedal injuries to medical professional immediately. 6. Follow up 3 months.  7. Patient/POA to call should there be a concern in the interim.

## 2019-08-22 ENCOUNTER — Ambulatory Visit: Payer: Medicare Other | Attending: Internal Medicine

## 2019-08-22 DIAGNOSIS — Z20822 Contact with and (suspected) exposure to covid-19: Secondary | ICD-10-CM

## 2019-08-23 LAB — NOVEL CORONAVIRUS, NAA: SARS-CoV-2, NAA: NOT DETECTED

## 2019-09-05 LAB — BASIC METABOLIC PANEL
BUN/Creatinine Ratio: 11 — ABNORMAL LOW (ref 12–28)
BUN: 7 mg/dL — ABNORMAL LOW (ref 8–27)
CO2: 24 mmol/L (ref 20–29)
Calcium: 9.3 mg/dL (ref 8.7–10.3)
Chloride: 104 mmol/L (ref 96–106)
Creatinine, Ser: 0.63 mg/dL (ref 0.57–1.00)
GFR calc Af Amer: 101 mL/min/{1.73_m2} (ref >59)
GFR calc non Af Amer: 88 mL/min/{1.73_m2} (ref >59)
Glucose: 102 mg/dL — ABNORMAL HIGH (ref 65–99)
Potassium: 4.3 mmol/L (ref 3.5–5.2)
Sodium: 141 mmol/L (ref 134–144)

## 2019-09-05 LAB — HEPATIC FUNCTION PANEL
ALT: 10 IU/L (ref 0–32)
AST: 13 IU/L (ref 0–40)
Albumin: 4.4 g/dL (ref 3.7–4.7)
Alkaline Phosphatase: 121 IU/L — ABNORMAL HIGH (ref 39–117)
Bilirubin Total: 0.5 mg/dL (ref 0.0–1.2)
Bilirubin, Direct: 0.12 mg/dL (ref 0.00–0.40)
Total Protein: 6.7 g/dL (ref 6.0–8.5)

## 2019-09-05 LAB — LIPID PANEL
Chol/HDL Ratio: 3.6 ratio (ref 0.0–4.4)
Cholesterol, Total: 213 mg/dL — ABNORMAL HIGH (ref 100–199)
HDL: 59 mg/dL (ref >39)
LDL Chol Calc (NIH): 132 mg/dL — ABNORMAL HIGH (ref 0–99)
Triglycerides: 122 mg/dL (ref 0–149)
VLDL Cholesterol Cal: 22 mg/dL (ref 5–40)

## 2019-09-07 ENCOUNTER — Other Ambulatory Visit: Payer: Self-pay

## 2019-09-07 ENCOUNTER — Telehealth: Payer: Self-pay | Admitting: *Deleted

## 2019-09-07 DIAGNOSIS — I10 Essential (primary) hypertension: Secondary | ICD-10-CM

## 2019-09-07 DIAGNOSIS — E785 Hyperlipidemia, unspecified: Secondary | ICD-10-CM

## 2019-09-07 DIAGNOSIS — I2510 Atherosclerotic heart disease of native coronary artery without angina pectoris: Secondary | ICD-10-CM

## 2019-09-07 MED ORDER — EZETIMIBE 10 MG PO TABS: 10.0000 mg | ORAL_TABLET | Freq: Every day | ORAL | 3 refills | Status: DC

## 2019-09-07 NOTE — Telephone Encounter (Addendum)
Left message for pt to call   ----- Message from Lelon Perla, MD sent at 09/05/2019  4:13 PM EST ----- Add Zetia 10 mg daily.  Check GGT, 5 prime nucleotidase, lipids and liver in 12 weeks. Kirk Ruths

## 2019-09-07 NOTE — Telephone Encounter (Signed)
Spoke to patient lab result given.

## 2019-09-07 NOTE — Telephone Encounter (Signed)
Patient is returning call and following up in regards to lab results. Please call.

## 2019-09-07 NOTE — Progress Notes (Signed)
This encounter was created in error - please disregard.

## 2019-09-14 ENCOUNTER — Ambulatory Visit: Payer: Medicare Other | Attending: Internal Medicine

## 2019-09-14 DIAGNOSIS — Z23 Encounter for immunization: Secondary | ICD-10-CM

## 2019-09-14 NOTE — Progress Notes (Signed)
   Covid-19 Vaccination Clinic  Name:  LINEA CALLES    MRN: [id number] DOB: 1945-09-07  09/14/2019  Ms. Cherubini was observed post Covid-19 immunization for 15 minutes without incidence. She was provided with Vaccine Information Sheet and instruction to access the V-Safe system.   Ms. Bitterman was instructed to call 911 with any severe reactions post vaccine: Marland Kitchen Difficulty breathing  . Swelling of your face and throat  . A fast heartbeat  . A bad rash all over your body  . Dizziness and weakness    Immunizations Administered    Name Date Dose VIS Date Route   Pfizer COVID-19 Vaccine 09/14/2019 11:54 AM 0.3 mL 08/03/2019 Intramuscular   Manufacturer: [place]   Lot: [id number]   [place]: [address]-0

## 2019-09-28 ENCOUNTER — Other Ambulatory Visit: Payer: Self-pay | Admitting: *Deleted

## 2019-09-28 DIAGNOSIS — R918 Other nonspecific abnormal finding of lung field: Secondary | ICD-10-CM

## 2019-09-28 DIAGNOSIS — Z87891 Personal history of nicotine dependence: Secondary | ICD-10-CM

## 2019-09-28 NOTE — Progress Notes (Unsigned)
Low

## 2019-10-01 ENCOUNTER — Other Ambulatory Visit: Payer: Self-pay | Admitting: *Deleted

## 2019-10-01 DIAGNOSIS — R918 Other nonspecific abnormal finding of lung field: Secondary | ICD-10-CM

## 2019-10-01 DIAGNOSIS — Z87891 Personal history of nicotine dependence: Secondary | ICD-10-CM

## 2019-10-02 ENCOUNTER — Other Ambulatory Visit: Payer: Self-pay

## 2019-10-02 ENCOUNTER — Ambulatory Visit (INDEPENDENT_AMBULATORY_CARE_PROVIDER_SITE_OTHER): Payer: Medicare Other

## 2019-10-02 ENCOUNTER — Ambulatory Visit: Payer: Medicare Other | Admitting: Podiatry

## 2019-10-02 DIAGNOSIS — M79676 Pain in unspecified toe(s): Secondary | ICD-10-CM

## 2019-10-02 DIAGNOSIS — I70209 Unspecified atherosclerosis of native arteries of extremities, unspecified extremity: Secondary | ICD-10-CM

## 2019-10-02 DIAGNOSIS — M722 Plantar fascial fibromatosis: Secondary | ICD-10-CM | POA: Insufficient documentation

## 2019-10-02 DIAGNOSIS — M21619 Bunion of unspecified foot: Secondary | ICD-10-CM

## 2019-10-02 DIAGNOSIS — B351 Tinea unguium: Secondary | ICD-10-CM | POA: Diagnosis not present

## 2019-10-02 DIAGNOSIS — L84 Corns and callosities: Secondary | ICD-10-CM

## 2019-10-02 DIAGNOSIS — E119 Type 2 diabetes mellitus without complications: Secondary | ICD-10-CM | POA: Diagnosis not present

## 2019-10-02 DIAGNOSIS — E1151 Type 2 diabetes mellitus with diabetic peripheral angiopathy without gangrene: Secondary | ICD-10-CM

## 2019-10-02 DIAGNOSIS — M21612 Bunion of left foot: Secondary | ICD-10-CM

## 2019-10-02 HISTORY — DX: Plantar fascial fibromatosis: M72.2

## 2019-10-02 NOTE — Patient Instructions (Addendum)
Plantar Fasciitis (Heel Spur Syndrome) with Rehab The plantar fascia is a fibrous, ligament-like, soft-tissue structure that spans the bottom of the foot. Plantar fasciitis is a condition that causes pain in the foot due to inflammation of the tissue. SYMPTOMS   Pain and tenderness on the underneath side of the foot.  Pain that worsens with standing or walking. CAUSES  Plantar fasciitis is caused by irritation and injury to the plantar fascia on the underneath side of the foot. Common mechanisms of injury include:  Direct trauma to bottom of the foot.  Damage to a small nerve that runs under the foot where the main fascia attaches to the heel bone.  Stress placed on the plantar fascia due to bone spurs. RISK INCREASES WITH:   Activities that place stress on the plantar fascia (running, jumping, pivoting, or cutting).  Poor strength and flexibility.  Improperly fitted shoes.  Tight calf muscles.  Flat feet.  Failure to warm-up properly before activity.  Obesity. PREVENTION  Warm up and stretch properly before activity.  Allow for adequate recovery between workouts.  Maintain physical fitness:  Strength, flexibility, and endurance.  Cardiovascular fitness.  Maintain a health body weight.  Avoid stress on the plantar fascia.  Wear properly fitted shoes, including arch supports for individuals who have flat feet.  PROGNOSIS  If treated properly, then the symptoms of plantar fasciitis usually resolve without surgery. However, occasionally surgery is necessary.  RELATED COMPLICATIONS   Recurrent symptoms that may result in a chronic condition.  Problems of the lower back that are caused by compensating for the injury, such as limping.  Pain or weakness of the foot during push-off following surgery.  Chronic inflammation, scarring, and partial or complete fascia tear, occurring more often from repeated injections.  TREATMENT  Treatment initially involves the  use of ice and medication to help reduce pain and inflammation. The use of strengthening and stretching exercises may help reduce pain with activity, especially stretches of the Achilles tendon. These exercises may be performed at home or with a therapist. Your caregiver may recommend that you use heel cups of arch supports to help reduce stress on the plantar fascia. Occasionally, corticosteroid injections are given to reduce inflammation. If symptoms persist for greater than 6 months despite non-surgical (conservative), then surgery may be recommended.   MEDICATION   If pain medication is necessary, then nonsteroidal anti-inflammatory medications, such as aspirin and ibuprofen, or other minor pain relievers, such as acetaminophen, are often recommended.  Do not take pain medication within 7 days before surgery.  Prescription pain relievers may be given if deemed necessary by your caregiver. Use only as directed and only as much as you need.  Corticosteroid injections may be given by your caregiver. These injections should be reserved for the most serious cases, because they may only be given a certain number of times.  HEAT AND COLD  Cold treatment (icing) relieves pain and reduces inflammation. Cold treatment should be applied for 10 to 15 minutes every 2 to 3 hours for inflammation and pain and immediately after any activity that aggravates your symptoms. Use ice packs or massage the area with a piece of ice (ice massage).  Heat treatment may be used prior to performing the stretching and strengthening activities prescribed by your caregiver, physical therapist, or athletic trainer. Use a heat pack or soak the injury in warm water.  SEEK IMMEDIATE MEDICAL CARE IF:  Treatment seems to offer no benefit, or the condition worsens.  Any medications  produce adverse side effects.  EXERCISES- RANGE OF MOTION (ROM) AND STRETCHING EXERCISES - Plantar Fasciitis (Heel Spur Syndrome) These exercises  may help you when beginning to rehabilitate your injury. Your symptoms may resolve with or without further involvement from your physician, physical therapist or athletic trainer. While completing these exercises, remember:   Restoring tissue flexibility helps normal motion to return to the joints. This allows healthier, less painful movement and activity.  An effective stretch should be held for at least 30 seconds.  A stretch should never be painful. You should only feel a gentle lengthening or release in the stretched tissue.  RANGE OF MOTION - Toe Extension, Flexion  Sit with your right / left leg crossed over your opposite knee.  Grasp your toes and gently pull them back toward the top of your foot. You should feel a stretch on the bottom of your toes and/or foot.  Hold this stretch for 10 seconds.  Now, gently pull your toes toward the bottom of your foot. You should feel a stretch on the top of your toes and or foot.  Hold this stretch for 10 seconds. Repeat  times. Complete this stretch 3 times per day.   RANGE OF MOTION - Ankle Dorsiflexion, Active Assisted  Remove shoes and sit on a chair that is preferably not on a carpeted surface.  Place right / left foot under knee. Extend your opposite leg for support.  Keeping your heel down, slide your right / left foot back toward the chair until you feel a stretch at your ankle or calf. If you do not feel a stretch, slide your bottom forward to the edge of the chair, while still keeping your heel down.  Hold this stretch for 10 seconds. Repeat 3 times. Complete this stretch 2 times per day.   STRETCH  Gastroc, Standing  Place hands on wall.  Extend right / left leg, keeping the front knee somewhat bent.  Slightly point your toes inward on your back foot.  Keeping your right / left heel on the floor and your knee straight, shift your weight toward the wall, not allowing your back to arch.  You should feel a gentle stretch  in the right / left calf. Hold this position for 10 seconds. Repeat 3 times. Complete this stretch 2 times per day.  STRETCH  Soleus, Standing  Place hands on wall.  Extend right / left leg, keeping the other knee somewhat bent.  Slightly point your toes inward on your back foot.  Keep your right / left heel on the floor, bend your back knee, and slightly shift your weight over the back leg so that you feel a gentle stretch deep in your back calf.  Hold this position for 10 seconds. Repeat 3 times. Complete this stretch 2 times per day.  STRETCH  Gastrocsoleus, Standing  Note: This exercise can place a lot of stress on your foot and ankle. Please complete this exercise only if specifically instructed by your caregiver.   Place the ball of your right / left foot on a step, keeping your other foot firmly on the same step.  Hold on to the wall or a rail for balance.  Slowly lift your other foot, allowing your body weight to press your heel down over the edge of the step.  You should feel a stretch in your right / left calf.  Hold this position for 10 seconds.  Repeat this exercise with a slight bend in your right /  left knee. Repeat 3 times. Complete this stretch 2 times per day.   STRENGTHENING EXERCISES - Plantar Fasciitis (Heel Spur Syndrome)  These exercises may help you when beginning to rehabilitate your injury. They may resolve your symptoms with or without further involvement from your physician, physical therapist or athletic trainer. While completing these exercises, remember:   Muscles can gain both the endurance and the strength needed for everyday activities through controlled exercises.  Complete these exercises as instructed by your physician, physical therapist or athletic trainer. Progress the resistance and repetitions only as guided.  STRENGTH - Towel Curls  Sit in a chair positioned on a non-carpeted surface.  Place your foot on a towel, keeping your heel  on the floor.  Pull the towel toward your heel by only curling your toes. Keep your heel on the floor. Repeat 3 times. Complete this exercise 2 times per day.  STRENGTH - Ankle Inversion  Secure one end of a rubber exercise band/tubing to a fixed object (table, pole). Loop the other end around your foot just before your toes.  Place your fists between your knees. This will focus your strengthening at your ankle.  Slowly, pull your big toe up and in, making sure the band/tubing is positioned to resist the entire motion.  Hold this position for 10 seconds.  Have your muscles resist the band/tubing as it slowly pulls your foot back to the starting position. Repeat 3 times. Complete this exercises 2 times per day.  Document Released: 08/09/2005 Document Revised: 11/01/2011 Document Reviewed: 11/21/2008 [facility] Patient Information 2014 [place], [place]. Diabetes Mellitus and Foot Care Foot care is an important part of your health, especially when you have diabetes. Diabetes may cause you to have problems because of poor blood flow (circulation) to your feet and legs, which can cause your skin to:  Become thinner and drier.  Break more easily.  Heal more slowly.  Peel and crack. You may also have nerve damage (neuropathy) in your legs and feet, causing decreased feeling in them. This means that you may not notice minor injuries to your feet that could lead to more serious problems. Noticing and addressing any potential problems early is the best way to prevent future foot problems. How to care for your feet Foot hygiene  Wash your feet daily with warm water and mild soap. Do not use hot water. Then, pat your feet and the areas between your toes until they are completely dry. Do not soak your feet as this can dry your skin.  Trim your toenails straight across. Do not dig under them or around the cuticle. File the edges of your nails with an emery board or nail file.  Apply a  moisturizing lotion or petroleum jelly to the skin on your feet and to dry, brittle toenails. Use lotion that does not contain alcohol and is unscented. Do not apply lotion between your toes. Shoes and socks  Wear clean socks or stockings every day. Make sure they are not too tight. Do not wear knee-high stockings since they may decrease blood flow to your legs.  Wear shoes that fit properly and have enough cushioning. Always look in your shoes before you put them on to be sure there are no objects inside.  To break in new shoes, wear them for just a few hours a day. This prevents injuries on your feet. Wounds, scrapes, corns, and calluses  Check your feet daily for blisters, cuts, bruises, sores, and redness. If you cannot see  the bottom of your feet, use a mirror or ask someone for help.  Do not cut corns or calluses or try to remove them with medicine.  If you find a minor scrape, cut, or break in the skin on your feet, keep it and the skin around it clean and dry. You may clean these areas with mild soap and water. Do not clean the area with peroxide, alcohol, or iodine.  If you have a wound, scrape, corn, or callus on your foot, look at it several times a day to make sure it is healing and not infected. Check for: ? Redness, swelling, or pain. ? Fluid or blood. ? Warmth. ? Pus or a bad smell. General instructions  Do not cross your legs. This may decrease blood flow to your feet.  Do not use heating pads or hot water bottles on your feet. They may burn your skin. If you have lost feeling in your feet or legs, you may not know this is happening until it is too late.  Protect your feet from hot and cold by wearing shoes, such as at the beach or on hot pavement.  Schedule a complete foot exam at least once a year (annually) or more often if you have foot problems. If you have foot problems, report any cuts, sores, or bruises to your health care provider immediately. Contact a health  care provider if:  You have a medical condition that increases your risk of infection and you have any cuts, sores, or bruises on your feet.  You have an injury that is not healing.  You have redness on your legs or feet.  You feel burning or tingling in your legs or feet.  You have pain or cramps in your legs and feet.  Your legs or feet are numb.  Your feet always feel cold.  You have pain around a toenail. Get help right away if:  You have a wound, scrape, corn, or callus on your foot and: ? You have pain, swelling, or redness that gets worse. ? You have fluid or blood coming from the wound, scrape, corn, or callus. ? Your wound, scrape, corn, or callus feels warm to the touch. ? You have pus or a bad smell coming from the wound, scrape, corn, or callus. ? You have a fever. ? You have a red line going up your leg. Summary  Check your feet every day for cuts, sores, red spots, swelling, and blisters.  Moisturize feet and legs daily.  Wear shoes that fit properly and have enough cushioning.  If you have foot problems, report any cuts, sores, or bruises to your health care provider immediately.  Schedule a complete foot exam at least once a year (annually) or more often if you have foot problems. This information is not intended to replace advice given to you by your health care provider. Make sure you discuss any questions you have with your health care provider. Document Revised: 05/02/2019 Document Reviewed: 09/10/2016 Elsevier Patient Education  [place].

## 2019-10-04 ENCOUNTER — Encounter: Payer: Self-pay | Admitting: Podiatry

## 2019-10-04 NOTE — Progress Notes (Signed)
Subjective: [name] presents today for follow up of preventative diabetic foot care and painful mycotic nails b/l that are difficult to trim. Pain interferes with ambulation. Aggravating factors include wearing enclosed shoe gear. Pain is relieved with periodic professional debridement.   Patient relates pain to arch of left foot x 1 month. Denies any traumatic episode leading to development of pain. Describes pain as achy and sore and sometimes limits ability to ambulate.  Allergies  Allergen Reactions  . Atorvastatin Other (See Comments)    Muscle aches Other reaction(s): Other (See Comments) Muscle aches    Objective: There were no vitals filed for this visit.  Vascular Examination:  Capillary refill time to digits immediate b/l, faintly palpable DP pulses b/l, faintly palpable PT pulses b/l, pedal hair absent b/l and skin temperature gradient within normal limits b/l  Dermatological Examination: Pedal skin with normal turgor, texture and tone bilaterally, no open wounds bilaterally, no interdigital macerations bilaterally, toenails 1-5 b/l elongated, dystrophic, thickened, crumbly with subungual debris and hyperkeratotic lesion(s) distal tip right hallux.  No erythema, no edema, no drainage, no flocculence  Musculoskeletal: Normal muscle strength 5/5 to all lower extremity muscle groups bilaterally, no pain crepitus or joint limitation noted with ROM b/l and bunion deformity noted b/l   Pain along medial longitudinal arch.   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 Literature dispensed on today.  -Toenails 1-5 b/l were debrided in length and girth without iatrogenic bleeding. -calluses were debrided without complication or incident. Total number debrided =1 right hallux -Patient to continue soft, supportive shoe gear daily. Start procedure for diabetic shoes. Patient qualifies based on diagnoses -Patient to report any pedal injuries to medical professional immediately. -Patient/POA to call should there be question/concern in the interim.  Return in 3 months (on 12/30/2019) for diabetic nail and callus trim, follow up plantar fasciitis.

## 2019-10-05 ENCOUNTER — Ambulatory Visit: Payer: Medicare Other | Attending: Internal Medicine

## 2019-10-05 DIAGNOSIS — Z23 Encounter for immunization: Secondary | ICD-10-CM

## 2019-10-05 NOTE — Progress Notes (Signed)
   Covid-19 Vaccination Clinic  Name:  [name]    MRN: [id number] DOB: 07/26/1946  10/05/2019  [name] was observed post Covid-19 immunization for 15 minutes without incidence. She was provided with Vaccine Information Sheet and instruction to access the V-Safe system.   [name] was instructed to call 911 with any severe reactions post vaccine: Marland Kitchen Difficulty breathing  . Swelling of your face and throat  . A fast heartbeat  . A bad rash all over your body  . Dizziness and weakness    Immunizations Administered    Name Date Dose VIS Date Route   Pfizer COVID-19 Vaccine 10/05/2019  3:39 PM 0.3 mL 08/03/2019 Intramuscular   Manufacturer: [place]   Lot: [id number]   [place]: [id number]

## 2019-10-08 DIAGNOSIS — E559 Vitamin D deficiency, unspecified: Secondary | ICD-10-CM | POA: Diagnosis not present

## 2019-10-08 DIAGNOSIS — I10 Essential (primary) hypertension: Secondary | ICD-10-CM | POA: Diagnosis not present

## 2019-10-08 DIAGNOSIS — I70223 Atherosclerosis of native arteries of extremities with rest pain, bilateral legs: Secondary | ICD-10-CM | POA: Diagnosis not present

## 2019-10-08 DIAGNOSIS — E1151 Type 2 diabetes mellitus with diabetic peripheral angiopathy without gangrene: Secondary | ICD-10-CM | POA: Diagnosis not present

## 2019-10-08 DIAGNOSIS — Z0001 Encounter for general adult medical examination with abnormal findings: Secondary | ICD-10-CM | POA: Diagnosis not present

## 2019-10-18 DIAGNOSIS — D126 Benign neoplasm of colon, unspecified: Secondary | ICD-10-CM | POA: Diagnosis not present

## 2019-10-18 DIAGNOSIS — I739 Peripheral vascular disease, unspecified: Secondary | ICD-10-CM | POA: Diagnosis not present

## 2019-10-18 DIAGNOSIS — R0989 Other specified symptoms and signs involving the circulatory and respiratory systems: Secondary | ICD-10-CM | POA: Diagnosis not present

## 2019-10-18 DIAGNOSIS — I10 Essential (primary) hypertension: Secondary | ICD-10-CM | POA: Diagnosis not present

## 2019-10-18 DIAGNOSIS — I25810 Atherosclerosis of coronary artery bypass graft(s) without angina pectoris: Secondary | ICD-10-CM | POA: Diagnosis not present

## 2019-10-18 DIAGNOSIS — E1151 Type 2 diabetes mellitus with diabetic peripheral angiopathy without gangrene: Secondary | ICD-10-CM | POA: Diagnosis not present

## 2019-11-07 ENCOUNTER — Telehealth: Payer: Self-pay | Admitting: Cardiovascular Disease

## 2019-11-07 DIAGNOSIS — Z96651 Presence of right artificial knee joint: Secondary | ICD-10-CM | POA: Diagnosis not present

## 2019-11-07 DIAGNOSIS — Z471 Aftercare following joint replacement surgery: Secondary | ICD-10-CM | POA: Diagnosis not present

## 2019-11-07 NOTE — Telephone Encounter (Signed)
Pt needs bilateral LEAs then ROV with me

## 2019-11-07 NOTE — Telephone Encounter (Signed)
Spoke with pt, dopplers and follow up scheduled.

## 2019-11-07 NOTE — Telephone Encounter (Signed)
New message   Patient states that she was advised by Dr. Latanya Maudlin, orthopedic doctor advised that she has poor circulation in right leg. Please cal to discuss.

## 2019-11-07 NOTE — Telephone Encounter (Signed)
I spoke with patient. She reports she saw Dr Gladstone Lighter today for right knee swelling. Was told she had poor circulation in right leg and to contact Dr Gwenlyn Found.  Patient was last seen by Dr Gwenlyn Found in July 2020 and vascular studies in August 2020 were stable. Patient reports since that time she has developed swelling in knee, leg pain when walking and cramps in legs during the night. Pain and cramps are in both legs but worse in right leg. I told patient I would send message to Dr Gwenlyn Found to see if tests recommended or if patient should have office visit first.

## 2019-11-08 DIAGNOSIS — H40013 Open angle with borderline findings, low risk, bilateral: Secondary | ICD-10-CM | POA: Diagnosis not present

## 2019-11-08 DIAGNOSIS — E119 Type 2 diabetes mellitus without complications: Secondary | ICD-10-CM | POA: Diagnosis not present

## 2019-11-08 DIAGNOSIS — H2511 Age-related nuclear cataract, right eye: Secondary | ICD-10-CM | POA: Diagnosis not present

## 2019-11-08 DIAGNOSIS — H35373 Puckering of macula, bilateral: Secondary | ICD-10-CM | POA: Diagnosis not present

## 2019-11-08 DIAGNOSIS — H35033 Hypertensive retinopathy, bilateral: Secondary | ICD-10-CM | POA: Diagnosis not present

## 2019-11-15 ENCOUNTER — Other Ambulatory Visit: Payer: Self-pay

## 2019-11-15 ENCOUNTER — Ambulatory Visit (HOSPITAL_COMMUNITY)
Admission: RE | Admit: 2019-11-15 | Discharge: 2019-11-15 | Disposition: A | Discharge status: Home / Self Care | Payer: Medicare Other | Source: Ambulatory Visit | Attending: Cardiovascular Disease | Admitting: Cardiovascular Disease

## 2019-11-15 ENCOUNTER — Other Ambulatory Visit (HOSPITAL_COMMUNITY): Payer: Self-pay | Admitting: Cardiovascular Disease

## 2019-11-15 DIAGNOSIS — I739 Peripheral vascular disease, unspecified: Secondary | ICD-10-CM | POA: Diagnosis not present

## 2019-11-20 ENCOUNTER — Encounter: Payer: Medicare Other | Admitting: Thoracic Surgery (Cardiothoracic Vascular Surgery)

## 2019-11-20 ENCOUNTER — Ambulatory Visit: Payer: Medicare Other

## 2019-11-27 ENCOUNTER — Ambulatory Visit: Payer: Medicare Other | Admitting: Cardiovascular Disease

## 2019-12-04 ENCOUNTER — Encounter: Payer: Medicare Other | Admitting: Thoracic Surgery (Cardiothoracic Vascular Surgery)

## 2019-12-05 LAB — LIPID PANEL
Chol/HDL Ratio: 4.1 ratio (ref 0.0–4.4)
Cholesterol, Total: 203 mg/dL — ABNORMAL HIGH (ref 100–199)
HDL: 50 mg/dL (ref >39)
LDL Chol Calc (NIH): 126 mg/dL — ABNORMAL HIGH (ref 0–99)
Triglycerides: 151 mg/dL — ABNORMAL HIGH (ref 0–149)
VLDL Cholesterol Cal: 27 mg/dL (ref 5–40)

## 2019-12-05 LAB — HEPATIC FUNCTION PANEL
ALT: 15 IU/L (ref 0–32)
AST: 14 IU/L (ref 0–40)
Albumin: 4.1 g/dL (ref 3.7–4.7)
Alkaline Phosphatase: 101 IU/L (ref 39–117)
Bilirubin Total: 0.5 mg/dL (ref 0.0–1.2)
Bilirubin, Direct: 0.13 mg/dL (ref 0.00–0.40)
Total Protein: 6.6 g/dL (ref 6.0–8.5)

## 2019-12-05 LAB — NUCLEOTIDASE, 5', BLOOD: 5-Nucleotidase: 3 IU/L (ref 0–10)

## 2019-12-05 LAB — GAMMA GT: GGT: 20 IU/L (ref 0–60)

## 2019-12-07 ENCOUNTER — Other Ambulatory Visit: Payer: Self-pay | Admitting: *Deleted

## 2019-12-07 DIAGNOSIS — E785 Hyperlipidemia, unspecified: Secondary | ICD-10-CM

## 2019-12-07 MED ORDER — ROSUVASTATIN CALCIUM 40 MG PO TABS: 40.0000 mg | ORAL_TABLET | Freq: Every day | ORAL | 3 refills | Status: DC

## 2019-12-17 ENCOUNTER — Ambulatory Visit
Admission: RE | Admit: 2019-12-17 | Discharge: 2019-12-17 | Disposition: A | Discharge status: Home / Self Care | Payer: Medicare Other | Source: Ambulatory Visit | Attending: Thoracic Surgery (Cardiothoracic Vascular Surgery) | Admitting: Thoracic Surgery (Cardiothoracic Vascular Surgery)

## 2019-12-17 ENCOUNTER — Other Ambulatory Visit: Payer: Self-pay

## 2019-12-17 DIAGNOSIS — Z87891 Personal history of nicotine dependence: Secondary | ICD-10-CM

## 2019-12-17 DIAGNOSIS — R918 Other nonspecific abnormal finding of lung field: Secondary | ICD-10-CM

## 2019-12-21 ENCOUNTER — Other Ambulatory Visit: Payer: Self-pay | Admitting: Cardiovascular Disease

## 2019-12-21 DIAGNOSIS — I739 Peripheral vascular disease, unspecified: Secondary | ICD-10-CM

## 2019-12-25 ENCOUNTER — Ambulatory Visit: Payer: Medicare Other | Admitting: Thoracic Surgery (Cardiothoracic Vascular Surgery)

## 2019-12-25 ENCOUNTER — Ambulatory Visit: Payer: Medicare Other | Admitting: Orthotics

## 2019-12-25 ENCOUNTER — Other Ambulatory Visit: Payer: Self-pay

## 2019-12-25 ENCOUNTER — Encounter: Payer: Self-pay | Admitting: Thoracic Surgery (Cardiothoracic Vascular Surgery)

## 2019-12-25 VITALS — BP 140/71 | HR 74 | Temp 97.6°F | Resp 20 | Ht 63.0 in | Wt 133.0 lb

## 2019-12-25 DIAGNOSIS — Z87891 Personal history of nicotine dependence: Secondary | ICD-10-CM

## 2019-12-25 DIAGNOSIS — R918 Other nonspecific abnormal finding of lung field: Secondary | ICD-10-CM

## 2019-12-25 DIAGNOSIS — M79676 Pain in unspecified toe(s): Secondary | ICD-10-CM

## 2019-12-25 DIAGNOSIS — M722 Plantar fascial fibromatosis: Secondary | ICD-10-CM

## 2019-12-25 DIAGNOSIS — I70201 Unspecified atherosclerosis of native arteries of extremities, right leg: Secondary | ICD-10-CM

## 2019-12-25 DIAGNOSIS — B351 Tinea unguium: Secondary | ICD-10-CM

## 2019-12-25 DIAGNOSIS — E1151 Type 2 diabetes mellitus with diabetic peripheral angiopathy without gangrene: Secondary | ICD-10-CM

## 2019-12-25 DIAGNOSIS — E119 Type 2 diabetes mellitus without complications: Secondary | ICD-10-CM

## 2019-12-25 DIAGNOSIS — L84 Corns and callosities: Secondary | ICD-10-CM

## 2019-12-25 NOTE — Progress Notes (Signed)
[address]       [place] [address]             [phone]       HPI: [name] returns for a scheduled follow-up visit  [name] is a 74 year old woman with a history of tobacco abuse, COPD, CAD, CABG, hypertension, and gastroesophageal reflux.  She had coronary bypass grafting in 2012.  She was found to have a possible right apical mass on the CT of the cervical spine in 2011.  CT of the chest showed bilateral apical parenchymal densities that were not hypermetabolic on PET/CT.  She did have multiple other 3 to 4 mm lung nodules and she is been followed since then.  I last saw her in March 2019.  She had multiple stable lung nodules and no indication for further intervention.  She missed her appointment last year during the COVID-19 pandemic.  She states that she has been having some chest pain at night.  This wakes her up in the middle of the night.  She is not having any exertional chest discomfort.  She also complains of swelling and pain in her right knee where she has had a previous knee replacement.  She quit smoking about 10 years ago.  Past Medical History:  Diagnosis Date  . Anemia   . Anxiety    hx  . Arthritis    "my whole body"  . Atrial septal aneurysm   . COLONIC POLYPS, HX OF   . COPD (chronic obstructive pulmonary disease) ([place])    no per pt on 03/21/2014 & 02/06/2018  . Coronary artery disease    a. s/p CABG 2012.  Marland Kitchen DISC DISEASE, CERVICAL   . Gastroesophageal reflux disease   . Headache    "I have headaches all the time" (02/06/2018)  . Heart murmur    as a child  . History of rheumatic fever   . Hyperlipidemia   . Hypertension   . Hypothyroidism   . Insomnia   . Osteoarthritis   . OSTEOPENIA   . PAD (peripheral artery disease) ([place])    a. PTA to R SFA 2015. b. PTA to L SFA 05/2016.  Marland Kitchen Plantar fasciitis of left foot 10/02/2019  . Pre-diabetes   . RSD (reflex sympathetic dystrophy) 10/31/2011  . Vitamin D deficiency      Current Outpatient Medications  Medication Sig Dispense Refill  . albuterol (VENTOLIN HFA) 108 (90 Base) MCG/ACT inhaler INHALE 1 PUFF BY MOUTH EVERY 6 HOURS AS NEEDED FOR SHORTNESS OF BREATH AND FOR WHEEZING    . alclomethasone (ACLOVATE) 0.05 % cream Apply 1 application topically as needed.     Marland Kitchen amLODipine (NORVASC) 10 MG tablet TAKE ONE TABLET BY MOUTH ONCE DAILY 90 tablet 1  . aspirin EC 81 MG tablet Take 81 mg by mouth daily.    . Calcium Carb-Cholecalciferol (CALCIUM 1000 + D PO) Take 1 tablet by mouth every other day.     . clindamycin (CLEOCIN) 150 MG capsule Take 150 mg by mouth 4 (four) times daily.    . clopidogrel (PLAVIX) 75 MG tablet Take 1 tablet (75 mg total) by mouth daily with breakfast. 90 tablet 3  . co-enzyme Q-10 30 MG capsule Take 30 mg by mouth as needed.     . CONSTULOSE 10 GM/15ML solution TAKE 30 ML BY MOUTH TWICE DAILY AS NEEDED    . cyclobenzaprine (FLEXERIL) 5 MG tablet TAKE 1 TABLET BY MOUTH TWICE DAILY  AS NEEDED FOR MUSCLE SPASM    . ezetimibe (ZETIA) 10 MG tablet Take 1 tablet (10 mg total) by mouth daily. 90 tablet 3  . hydrOXYzine (ATARAX/VISTARIL) 25 MG tablet Take 25 mg by mouth 3 (three) times daily.    . Lancets (ONETOUCH DELICA PLUS [id number]) MISC 1 each by Other route 2 (two) times daily.    Marland Kitchen levothyroxine (SYNTHROID, LEVOTHROID) 150 MCG tablet Take 150 mcg by mouth daily before breakfast.    . Multiple Vitamin (MULTIVITAMIN WITH MINERALS) TABS tablet Take 1 tablet by mouth every morning. Centrum    . ONETOUCH VERIO test strip 1 each by Other route 2 (two) times daily.    . pantoprazole (PROTONIX) 40 MG tablet Take 1 tablet (40 mg total) by mouth daily. 30 tablet 6  . Propylene Glycol (SYSTANE COMPLETE) 0.6 % SOLN Place 1 drop into both eyes every morning.    . rosuvastatin (CRESTOR) 40 MG tablet Take 1 tablet (40 mg total) by mouth daily. 90 tablet 3  . triamcinolone ointment (KENALOG) 0.1 %     . Vitamins/Minerals TABS Take by mouth.      Current Facility-Administered Medications  Medication Dose Route Frequency Provider Last Rate Last Admin  . 0.9 %  sodium chloride infusion  500 mL Intravenous Once Milus Banister, MD       Facility-Administered Medications Ordered in Other Visits  Medication Dose Route Frequency Provider Last Rate Last Admin  . tranexamic acid (CYKLOKAPRON) 2,000 mg in sodium chloride 0.9 % 50 mL Topical Application  5,102 mg Topical Once Constable, Amber, PA-C        Physical Exam BP 140/71   Pulse 74   Temp 97.6 F (36.4 C) (Skin)   Resp 20   Ht 5\' 3"  (1.6 m)   Wt 133 lb (60.3 kg)   SpO2 92% Comment: RA  BMI 23.91 kg/m  74 year old woman in no acute distress Alert and oriented x3 with no focal deficits No cervical or supraclavicular adenopathy Cardiac regular rate and rhythm Lungs clear with equal breath sounds bilaterally Swelling around right knee, no erythema, no peripheral edema  Diagnostic Tests: CT CHEST WITHOUT CONTRAST  TECHNIQUE: Multidetector CT imaging of the chest was performed following the standard protocol without IV contrast.  COMPARISON:  11/10/2018  FINDINGS: Cardiovascular: Aortic and branch vessel atherosclerosis. Tortuous thoracic aorta. Normal heart size, without pericardial effusion. Median sternotomy for CABG.  Mediastinum/Nodes: Thyroidectomy. No mediastinal or definite hilar adenopathy, given limitations of unenhanced CT.  Lungs/Pleura: No pleural fluid.  Moderate centrilobular emphysema.  Biapical pleuroparenchymal scarring.  Multiple right-sided pulmonary nodules are again identified. These are marked on series 3 and primarily similar. Within the posterior right upper lobe, a somewhat irregular nodule measures maximally 7 mm on 103/3. Compare 5 mm on 11/10/2018 and 4 mm on 11/08/2017 (when remeasured).  Upper Abdomen: Left hepatic lobe calcifications may be related to old granulomatous disease. There is also a subcentimeter segment 3  hepatic cyst. Normal imaged portions of the spleen, stomach, pancreas, left kidney. Bilateral adrenal thickening with maintenance of adreniform shape, incompletely imaged.  Musculoskeletal: No acute osseous abnormality. Lower cervical spondylosis with C7-T1 anterolisthesis.  IMPRESSION: 1. Bilateral pulmonary nodules, primarily similar. A posterior right upper lobe nodule may have enlarged when compared back to prior exams including 2019. This remains below PET resolution. Consider chest CT follow-up at 6 months. 2. No thoracic adenopathy. 3. Aortic atherosclerosis (ICD10-I70.0) and emphysema (ICD10-J43.9).   Electronically Signed   By: Adria Devon.D.  On: 12/17/2019 11:58 I personally reviewed the CT images and concur with the findings noted above  Impression: [name] is a 74 year old former smoker with a past history of tobacco abuse, COPD, CAD, CABG, hypertension, lung nodules, and gastroesophageal reflux.  Lung nodules-quit smoking in 2011.  Has multiple stable lung nodules, but there is one nodule in the posterior aspect of the right upper lobe that appears slightly larger over a 2-year span since her last CT.  This is still only 7 mm and so PET would not be particularly helpful.  I agree that we should scan her in 6 months to get another look at that nodule.  Chest pain-has a history of CAD but has no exertional component.  The symptoms only occur at night when she is lying down.  I suspect this is reflux.  She does have a history of that as well.  Plan: Return in 6 months with CT chest follow-up lung nodules particularly posterior right upper lobe lung nodule.   Melrose Nakayama, MD [facility] [phone]

## 2019-12-25 NOTE — Progress Notes (Signed)

## 2019-12-31 ENCOUNTER — Encounter: Payer: Self-pay | Admitting: Podiatry

## 2019-12-31 ENCOUNTER — Ambulatory Visit: Payer: Medicare Other | Admitting: Podiatry

## 2019-12-31 ENCOUNTER — Other Ambulatory Visit: Payer: Self-pay

## 2019-12-31 VITALS — Temp 97.0°F

## 2019-12-31 DIAGNOSIS — E1151 Type 2 diabetes mellitus with diabetic peripheral angiopathy without gangrene: Secondary | ICD-10-CM | POA: Diagnosis not present

## 2019-12-31 DIAGNOSIS — L84 Corns and callosities: Secondary | ICD-10-CM

## 2019-12-31 DIAGNOSIS — M79676 Pain in unspecified toe(s): Secondary | ICD-10-CM

## 2019-12-31 DIAGNOSIS — B351 Tinea unguium: Secondary | ICD-10-CM | POA: Diagnosis not present

## 2019-12-31 NOTE — Patient Instructions (Addendum)
Diabetes Mellitus and Foot Care Foot care is an important part of your health, especially when you have diabetes. Diabetes may cause you to have problems because of poor blood flow (circulation) to your feet and legs, which can cause your skin to:  Become thinner and drier.  Break more easily.  Heal more slowly.  Peel and crack. You may also have nerve damage (neuropathy) in your legs and feet, causing decreased feeling in them. This means that you may not notice minor injuries to your feet that could lead to more serious problems. Noticing and addressing any potential problems early is the best way to prevent future foot problems. How to care for your feet Foot hygiene  Wash your feet daily with warm water and mild soap. Do not use hot water. Then, pat your feet and the areas between your toes until they are completely dry. Do not soak your feet as this can dry your skin.  Trim your toenails straight across. Do not dig under them or around the cuticle. File the edges of your nails with an emery board or nail file.  Apply a moisturizing lotion or petroleum jelly to the skin on your feet and to dry, brittle toenails. Use lotion that does not contain alcohol and is unscented. Do not apply lotion between your toes. Shoes and socks  Wear clean socks or stockings every day. Make sure they are not too tight. Do not wear knee-high stockings since they may decrease blood flow to your legs.  Wear shoes that fit properly and have enough cushioning. Always look in your shoes before you put them on to be sure there are no objects inside.  To break in new shoes, wear them for just a few hours a day. This prevents injuries on your feet. Wounds, scrapes, corns, and calluses  Check your feet daily for blisters, cuts, bruises, sores, and redness. If you cannot see the bottom of your feet, use a mirror or ask someone for help.  Do not cut corns or calluses or try to remove them with medicine.  If you  find a minor scrape, cut, or break in the skin on your feet, keep it and the skin around it clean and dry. You may clean these areas with mild soap and water. Do not clean the area with peroxide, alcohol, or iodine.  If you have a wound, scrape, corn, or callus on your foot, look at it several times a day to make sure it is healing and not infected. Check for: ? Redness, swelling, or pain. ? Fluid or blood. ? Warmth. ? Pus or a bad smell. General instructions  Do not cross your legs. This may decrease blood flow to your feet.  Do not use heating pads or hot water bottles on your feet. They may burn your skin. If you have lost feeling in your feet or legs, you may not know this is happening until it is too late.  Protect your feet from hot and cold by wearing shoes, such as at the beach or on hot pavement.  Schedule a complete foot exam at least once a year (annually) or more often if you have foot problems. If you have foot problems, report any cuts, sores, or bruises to your health care provider immediately. Contact a health care provider if:  You have a medical condition that increases your risk of infection and you have any cuts, sores, or bruises on your feet.  You have an injury that is not   healing.  You have redness on your legs or feet.  You feel burning or tingling in your legs or feet.  You have pain or cramps in your legs and feet.  Your legs or feet are numb.  Your feet always feel cold.  You have pain around a toenail. Get help right away if:  You have a wound, scrape, corn, or callus on your foot and: ? You have pain, swelling, or redness that gets worse. ? You have fluid or blood coming from the wound, scrape, corn, or callus. ? Your wound, scrape, corn, or callus feels warm to the touch. ? You have pus or a bad smell coming from the wound, scrape, corn, or callus. ? You have a fever. ? You have a red line going up your leg. Summary  Check your feet every day  for cuts, sores, red spots, swelling, and blisters.  Moisturize feet and legs daily.  Wear shoes that fit properly and have enough cushioning.  If you have foot problems, report any cuts, sores, or bruises to your health care provider immediately.  Schedule a complete foot exam at least once a year (annually) or more often if you have foot problems. This information is not intended to replace advice given to you by your health care provider. Make sure you discuss any questions you have with your health care provider. Document Revised: 05/02/2019 Document Reviewed: 09/10/2016 Elsevier Patient Education  2020 [employer] Inc.  

## 2020-01-06 NOTE — Progress Notes (Signed)
Subjective: [name] presents today at risk foot care. Pt has h/o NIDDM with PAD and callus(es) right hallux and painful mycotic toenails b/l that are difficult to trim. Pain interferes with ambulation. Aggravating factors include wearing enclosed shoe gear. Pain is relieved with periodic professional debridement.   She relates some numbness and cramping of right LE from time to time.  [name], FNP is patient's PCP.   Past Medical History:  Diagnosis Date  . Anemia   . Anxiety    hx  . Arthritis    "my whole body"  . Atrial septal aneurysm   . COLONIC POLYPS, HX OF   . COPD (chronic obstructive pulmonary disease) ([place])    no per pt on 03/21/2014 & 02/06/2018  . Coronary artery disease    a. s/p CABG 2012.  Marland Kitchen DISC DISEASE, CERVICAL   . Gastroesophageal reflux disease   . Headache    "I have headaches all the time" (02/06/2018)  . Heart murmur    as a child  . History of rheumatic fever   . Hyperlipidemia   . Hypertension   . Hypothyroidism   . Insomnia   . Osteoarthritis   . OSTEOPENIA   . PAD (peripheral artery disease) ([place])    a. PTA to R SFA 2015. b. PTA to L SFA 05/2016.  Marland Kitchen Plantar fasciitis of left foot 10/02/2019  . Pre-diabetes   . RSD (reflex sympathetic dystrophy) 10/31/2011  . Vitamin D deficiency      Current Outpatient Medications on File Prior to Visit  Medication Sig Dispense Refill  . albuterol (VENTOLIN HFA) 108 (90 Base) MCG/ACT inhaler INHALE 1 PUFF BY MOUTH EVERY 6 HOURS AS NEEDED FOR SHORTNESS OF BREATH AND FOR WHEEZING    . alclomethasone (ACLOVATE) 0.05 % cream Apply 1 application topically as needed.     Marland Kitchen amLODipine (NORVASC) 10 MG tablet TAKE ONE TABLET BY MOUTH ONCE DAILY 90 tablet 1  . aspirin EC 81 MG tablet Take 81 mg by mouth daily.    . Calcium Carb-Cholecalciferol (CALCIUM 1000 + D PO) Take 1 tablet by mouth every other day.     . clopidogrel (PLAVIX) 75 MG tablet Take 1 tablet (75 mg total) by mouth daily with breakfast. 90  tablet 3  . co-enzyme Q-10 30 MG capsule Take 30 mg by mouth as needed.     . CONSTULOSE 10 GM/15ML solution TAKE 30 ML BY MOUTH TWICE DAILY AS NEEDED    . cyclobenzaprine (FLEXERIL) 5 MG tablet TAKE 1 TABLET BY MOUTH TWICE DAILY AS NEEDED FOR MUSCLE SPASM    . hydrOXYzine (ATARAX/VISTARIL) 25 MG tablet Take 25 mg by mouth 3 (three) times daily.    . Lancets (ONETOUCH DELICA PLUS [id number]) MISC 1 each by Other route 2 (two) times daily.    Marland Kitchen levothyroxine (SYNTHROID, LEVOTHROID) 150 MCG tablet Take 150 mcg by mouth daily before breakfast.    . Multiple Vitamin (MULTIVITAMIN WITH MINERALS) TABS tablet Take 1 tablet by mouth every morning. Centrum    . ONETOUCH VERIO test strip 1 each by Other route 2 (two) times daily.    . pantoprazole (PROTONIX) 40 MG tablet Take 1 tablet (40 mg total) by mouth daily. 30 tablet 6  . Propylene Glycol (SYSTANE COMPLETE) 0.6 % SOLN Place 1 drop into both eyes every morning.    . rosuvastatin (CRESTOR) 40 MG tablet Take 1 tablet (40 mg total) by mouth daily. 90 tablet 3  . triamcinolone ointment (KENALOG)  0.1 %     . Vitamins/Minerals TABS Take by mouth.    . ezetimibe (ZETIA) 10 MG tablet Take 1 tablet (10 mg total) by mouth daily. 90 tablet 3   Current Facility-Administered Medications on File Prior to Visit  Medication Dose Route Frequency Provider Last Rate Last Admin  . 0.9 %  sodium chloride infusion  500 mL Intravenous Once Milus Banister, MD      . tranexamic acid (CYKLOKAPRON) 2,000 mg in sodium chloride 0.9 % 50 mL Topical Application  8,416 mg Topical Once Ardeen Jourdain, PA-C         Allergies  Allergen Reactions  . Atorvastatin Other (See Comments)    Muscle aches Other reaction(s): Other (See Comments) Muscle aches    Objective: CANDID BOVEY is a pleasant 74 y.o. y.o. Patient Race: Black or African American [2]  female in NAD. AAO x 3.  Vitals:   12/31/19 0912  Temp: (!) 97 F (36.1 C)    Vascular Examination: Capillary  refill time to digits immediate b/l. Faintly palpable pedal pulses b/l. Pedal hair present b/l. Skin temperature gradient within normal limits b/l.  Dermatological Examination: Pedal skin with normal turgor, texture and tone bilaterally. No open wounds bilaterally. Toenails 1-5 b/l elongated, dystrophic, thickened, crumbly with subungual debris and tenderness to dorsal palpation. Hyperkeratotic lesion(s) R hallux.  No erythema, no edema, no drainage, no flocculence.  Musculoskeletal: Normal muscle strength 5/5 to all lower extremity muscle groups bilaterally. No pain crepitus or joint limitation noted with ROM b/l. Hallux valgus with bunion deformity noted b/l.  Neurological Examination: Protective sensation intact 5/5 intact bilaterally with 10g monofilament b/l. Vibratory sensation intact b/l.  Assessment: 1. Pain due to onychomycosis of toenail   2. Callus   3. Type II diabetes mellitus with peripheral circulatory disorder (HCC)    Plan: -Examined patient. -No new findings. No new orders. -Continue diabetic foot care principles. Literature dispensed on today.  -Toenails 1-5 b/l were debrided in length and girth with sterile nail nippers and dremel without iatrogenic bleeding.  -Callus(es) R hallux pared utilizing sterile scalpel blade without complication or incident. Total number debrided =1. -Patient to continue soft, supportive shoe gear daily. -Patient to report any pedal injuries to medical professional immediately. -Patient/POA to call should there be question/concern in the interim.  Return in about 3 months (around 04/01/2020).  Marzetta Board, DPM

## 2020-01-23 ENCOUNTER — Other Ambulatory Visit: Payer: Self-pay | Admitting: *Deleted

## 2020-01-23 DIAGNOSIS — E785 Hyperlipidemia, unspecified: Secondary | ICD-10-CM

## 2020-02-13 ENCOUNTER — Telehealth: Payer: Self-pay | Admitting: Podiatry

## 2020-02-13 NOTE — Telephone Encounter (Signed)
Pt left message asking about status of diabetic shoes.   I returned call and left message I have contacted the company that handles our diabetic shoes and waiting on an updated status and I would call when I get more information but they should be shipping anytime from what I can see.

## 2020-02-20 ENCOUNTER — Telehealth: Payer: Self-pay | Admitting: Podiatry

## 2020-02-20 NOTE — Telephone Encounter (Signed)
Pt left message checking on status of diabetic shoes.  I returned call and left message for pt that inserts are to be arriving next week and to call to schedule an appt.

## 2020-03-08 LAB — LIPID PANEL
Chol/HDL Ratio: 3.4 ratio (ref 0.0–4.4)
Cholesterol, Total: 187 mg/dL (ref 100–199)
HDL: 55 mg/dL (ref >39)
LDL Chol Calc (NIH): 111 mg/dL — ABNORMAL HIGH (ref 0–99)
Triglycerides: 115 mg/dL (ref 0–149)
VLDL Cholesterol Cal: 21 mg/dL (ref 5–40)

## 2020-03-08 LAB — HEPATIC FUNCTION PANEL
ALT: 12 IU/L (ref 0–32)
AST: 14 IU/L (ref 0–40)
Albumin: 4 g/dL (ref 3.7–4.7)
Alkaline Phosphatase: 101 IU/L (ref 48–121)
Bilirubin Total: 0.4 mg/dL (ref 0.0–1.2)
Bilirubin, Direct: 0.09 mg/dL (ref 0.00–0.40)
Total Protein: 6.6 g/dL (ref 6.0–8.5)

## 2020-03-10 ENCOUNTER — Telehealth: Payer: Self-pay | Admitting: *Deleted

## 2020-03-10 DIAGNOSIS — E785 Hyperlipidemia, unspecified: Secondary | ICD-10-CM

## 2020-03-10 NOTE — Telephone Encounter (Addendum)
Spoke with pt, she reports not taking the rosuvastatin on a daily basis because she will forget to take it at bedtime. Patient aware can take in the morning if easier to remember. Patient voiced understanding and will restart today. She is not on zetia. Lab orders mailed to the pt   ----- Message from Lelon Perla, MD sent at 03/09/2020 12:37 PM EDT ----- Make sure pt taking crestor 40 mg daily and zetia 10 mg daily; if not ask her to do so; if she is, refer to lipid clinic for repatha; lipids and liver in 12 weeks if adjustments in meds made [place]s

## 2020-03-21 ENCOUNTER — Ambulatory Visit (INDEPENDENT_AMBULATORY_CARE_PROVIDER_SITE_OTHER): Payer: Medicare Other | Admitting: Orthotics

## 2020-03-21 ENCOUNTER — Other Ambulatory Visit: Payer: Self-pay

## 2020-03-21 DIAGNOSIS — M2141 Flat foot [pes planus] (acquired), right foot: Secondary | ICD-10-CM

## 2020-03-21 DIAGNOSIS — E1159 Type 2 diabetes mellitus with other circulatory complications: Secondary | ICD-10-CM

## 2020-03-21 DIAGNOSIS — M2142 Flat foot [pes planus] (acquired), left foot: Secondary | ICD-10-CM

## 2020-03-21 DIAGNOSIS — L84 Corns and callosities: Secondary | ICD-10-CM

## 2020-03-31 ENCOUNTER — Ambulatory Visit: Payer: Medicare Other | Admitting: Orthotics

## 2020-03-31 ENCOUNTER — Other Ambulatory Visit: Payer: Self-pay

## 2020-03-31 ENCOUNTER — Encounter: Payer: Self-pay | Admitting: Podiatry

## 2020-03-31 ENCOUNTER — Ambulatory Visit (INDEPENDENT_AMBULATORY_CARE_PROVIDER_SITE_OTHER): Payer: Medicare Other | Admitting: Podiatry

## 2020-03-31 DIAGNOSIS — B351 Tinea unguium: Secondary | ICD-10-CM | POA: Diagnosis not present

## 2020-03-31 DIAGNOSIS — M79676 Pain in unspecified toe(s): Secondary | ICD-10-CM | POA: Diagnosis not present

## 2020-03-31 DIAGNOSIS — L84 Corns and callosities: Secondary | ICD-10-CM | POA: Diagnosis not present

## 2020-03-31 DIAGNOSIS — E1151 Type 2 diabetes mellitus with diabetic peripheral angiopathy without gangrene: Secondary | ICD-10-CM

## 2020-03-31 NOTE — Progress Notes (Signed)
Subjective: [name] presents today at risk foot care. Pt has h/o NIDDM with PAD and callus(es) right hallux and painful mycotic toenails b/l that are difficult to trim. Pain interferes with ambulation. Aggravating factors include wearing enclosed shoe gear. Pain is relieved with periodic professional debridement.   She voices no new pedal problems on today's visit.  [name]., FNP is patient's PCP.   Past Medical History:  Diagnosis Date  . Anemia   . Anxiety    hx  . Arthritis    "my whole body"  . Atrial septal aneurysm   . COLONIC POLYPS, HX OF   . COPD (chronic obstructive pulmonary disease) ([place])    no per pt on 03/21/2014 & 02/06/2018  . Coronary artery disease    a. s/p CABG 2012.  Marland Kitchen DISC DISEASE, CERVICAL   . Gastroesophageal reflux disease   . Headache    "I have headaches all the time" (02/06/2018)  . Heart murmur    as a child  . History of rheumatic fever   . Hyperlipidemia   . Hypertension   . Hypothyroidism   . Insomnia   . Osteoarthritis   . OSTEOPENIA   . PAD (peripheral artery disease) ([place])    a. PTA to R SFA 2015. b. PTA to L SFA 05/2016.  Marland Kitchen Plantar fasciitis of left foot 10/02/2019  . Pre-diabetes   . RSD (reflex sympathetic dystrophy) 10/31/2011  . Vitamin D deficiency      Current Outpatient Medications on File Prior to Visit  Medication Sig Dispense Refill  . albuterol (VENTOLIN HFA) 108 (90 Base) MCG/ACT inhaler INHALE 1 PUFF BY MOUTH EVERY 6 HOURS AS NEEDED FOR SHORTNESS OF BREATH AND FOR WHEEZING    . alclomethasone (ACLOVATE) 0.05 % cream Apply 1 application topically as needed.     Marland Kitchen amLODipine (NORVASC) 10 MG tablet TAKE ONE TABLET BY MOUTH ONCE DAILY 90 tablet 1  . aspirin EC 81 MG tablet Take 81 mg by mouth daily.    . Calcium Carb-Cholecalciferol (CALCIUM 1000 + D PO) Take 1 tablet by mouth every other day.     . clopidogrel (PLAVIX) 75 MG tablet Take 1 tablet (75 mg total) by mouth daily with breakfast. 90 tablet 3  .  co-enzyme Q-10 30 MG capsule Take 30 mg by mouth as needed.     . CONSTULOSE 10 GM/15ML solution TAKE 30 ML BY MOUTH TWICE DAILY AS NEEDED    . cyclobenzaprine (FLEXERIL) 5 MG tablet TAKE 1 TABLET BY MOUTH TWICE DAILY AS NEEDED FOR MUSCLE SPASM    . hydrOXYzine (ATARAX/VISTARIL) 25 MG tablet Take 25 mg by mouth 3 (three) times daily.    . Lancets (ONETOUCH DELICA PLUS [id number]) MISC 1 each by Other route 2 (two) times daily.    Marland Kitchen levothyroxine (SYNTHROID, LEVOTHROID) 150 MCG tablet Take 150 mcg by mouth daily before breakfast.    . Multiple Vitamin (MULTIVITAMIN WITH MINERALS) TABS tablet Take 1 tablet by mouth every morning. Centrum    . ONETOUCH VERIO test strip 1 each by Other route 2 (two) times daily.    . pantoprazole (PROTONIX) 40 MG tablet Take 1 tablet (40 mg total) by mouth daily. 30 tablet 6  . Propylene Glycol (SYSTANE COMPLETE) 0.6 % SOLN Place 1 drop into both eyes every morning.    . rosuvastatin (CRESTOR) 40 MG tablet Take 1 tablet (40 mg total) by mouth daily. 90 tablet 3  . triamcinolone ointment (KENALOG) 0.1 %     .  Vitamin D, Ergocalciferol, (DRISDOL) 1.25 MG (50000 UNIT) CAPS capsule Take 50,000 Units by mouth once a week.    . Vitamins/Minerals TABS Take by mouth.     Current Facility-Administered Medications on File Prior to Visit  Medication Dose Route Frequency Provider Last Rate Last Admin  . 0.9 %  sodium chloride infusion  500 mL Intravenous Once Milus Banister, MD      . tranexamic acid (CYKLOKAPRON) 2,000 mg in sodium chloride 0.9 % 50 mL Topical Application  5,784 mg Topical Once Ardeen Jourdain, PA-C         Allergies  Allergen Reactions  . Atorvastatin Other (See Comments)    Muscle aches Other reaction(s): Other (See Comments) Muscle aches    Objective: [name] is a pleasant 74 y.o. African American female, WD, WN in NAD. AAO x 3.   No significant changes to physical examination on today's visit.  Vascular Examination: Capillary  refill time to digits immediate b/l. Faintly palpable pedal pulses b/l. Pedal hair present b/l. Skin temperature gradient within normal limits b/l.  Dermatological Examination: Pedal skin with normal turgor, texture and tone bilaterally. No open wounds bilaterally. Toenails 1-5 b/l elongated, dystrophic, thickened, crumbly with subungual debris and tenderness to dorsal palpation. Hyperkeratotic lesion(s) R hallux.  No erythema, no edema, no drainage, no flocculence.  Musculoskeletal: Normal muscle strength 5/5 to all lower extremity muscle groups bilaterally. No pain crepitus or joint limitation noted with ROM b/l. Hallux valgus with bunion deformity noted b/l.  Neurological Examination: Protective sensation intact 5/5 intact bilaterally with 10g monofilament b/l. Vibratory sensation intact b/l.  Assessment: 1. Pain due to onychomycosis of toenail   2. Callus   3. Type II diabetes mellitus with peripheral circulatory disorder (HCC)      Plan: -Examined patient. -No new findings. No new orders. -Continue diabetic foot care principles. Literature dispensed on today.  -Toenails 1-5 b/l were debrided in length and girth with sterile nail nippers and dremel without iatrogenic bleeding.  -Callus(es) R hallux pared utilizing sterile scalpel blade without complication or incident. Total number debrided =1. -Patient to continue soft, supportive shoe gear daily. -Patient to report any pedal injuries to medical professional immediately. -Patient/POA to call should there be question/concern in the interim.  Return in about 3 months (around 07/01/2020) for diabetic nail and callus trim.  Marzetta Board, DPM

## 2020-05-05 ENCOUNTER — Other Ambulatory Visit: Payer: Self-pay | Admitting: Student

## 2020-05-05 DIAGNOSIS — M79604 Pain in right leg: Secondary | ICD-10-CM

## 2020-05-06 ENCOUNTER — Ambulatory Visit
Admission: RE | Admit: 2020-05-06 | Discharge: 2020-05-06 | Disposition: A | Discharge status: Home / Self Care | Payer: Medicare Other | Source: Ambulatory Visit | Attending: Student | Admitting: Student

## 2020-05-06 DIAGNOSIS — M79604 Pain in right leg: Secondary | ICD-10-CM

## 2020-05-09 ENCOUNTER — Telehealth: Payer: Self-pay | Admitting: Cardiovascular Disease

## 2020-05-09 NOTE — Telephone Encounter (Signed)
Spoke with pt who report swelling in her right leg down to her foot for the past 2 week. She state she was evaluated by pcp and had an ultrasound done to rule out DVT. Pt voiced she would like for Dr. Gwenlyn Found to review results but also would like an appointment as symptoms are worsening and its painful to walk.   Appointment scheduled for 9/22. Will route to MD for any further recommendations.

## 2020-05-09 NOTE — Telephone Encounter (Signed)
Patient states that her right ankle and leg are swollen. Her PCP did a DVT test and she would like Dr. Gwenlyn Found to look at the results. Please advise.

## 2020-05-09 NOTE — Telephone Encounter (Signed)
Left message to call back  

## 2020-05-09 NOTE — Telephone Encounter (Signed)
Patient is returning call.  °

## 2020-05-12 NOTE — Telephone Encounter (Signed)
Pt updated and verbalized understanding.  

## 2020-05-12 NOTE — Telephone Encounter (Signed)
Ms. Crowell had a negative venous duplex ruling out DVT

## 2020-05-14 ENCOUNTER — Ambulatory Visit (INDEPENDENT_AMBULATORY_CARE_PROVIDER_SITE_OTHER): Payer: Medicare Other | Admitting: Cardiovascular Disease

## 2020-05-14 ENCOUNTER — Other Ambulatory Visit: Payer: Self-pay

## 2020-05-14 ENCOUNTER — Encounter: Payer: Self-pay | Admitting: Cardiovascular Disease

## 2020-05-14 VITALS — BP 124/72 | HR 83 | Ht 63.0 in | Wt 133.0 lb

## 2020-05-14 DIAGNOSIS — I739 Peripheral vascular disease, unspecified: Secondary | ICD-10-CM

## 2020-05-14 NOTE — Assessment & Plan Note (Signed)
History of PAD status post right SFA intervention by myself 02/06/2018 and left SFA intervention 2 years before 06/07/2016.  She had Dopplers performed 11/15/2019 revealing right ABI 0.88 and a left that was the same with widely patent SFAs.  She does complain of some sharp pain in her right ankle and calf.  It is not swollen.  Is not painful to palpation.  She had venous Dopplers performed 05/06/2020 that were negative for DVT.  She does have a palpable pedal pulse on that side.  She says her symptoms are not similar to her claudication symptoms prior to intervention.  I do not think this is related to arterial insufficiency.  We will recheck arterial Dopplers in March of next year 1 year from her prior study and I will see her back in 12 months for follow-up.

## 2020-05-14 NOTE — Patient Instructions (Signed)
Medication Instructions:  Your physician recommends that you continue on your current medications as directed. Please refer to the Current Medication list given to you today.  *If you need a refill on your cardiac medications before your next appointment, please call your pharmacy*  Testing/Procedures: Lower Extremity Arterial Doppler in March 2022   Follow-Up: At [facility], you and your health needs are our priority.  As part of our continuing mission to provide you with exceptional heart care, we have created designated Provider Care Teams.  These Care Teams include your primary Cardiologist (physician) and Advanced Practice Providers (APPs -  Physician Assistants and Nurse Practitioners) who all work together to provide you with the care you need, when you need it.  We recommend signing up for the patient portal called "MyChart".  Sign up information is provided on this After Visit Summary.  MyChart is used to connect with patients for Virtual Visits (Telemedicine).  Patients are able to view lab/test results, encounter notes, upcoming appointments, etc.  Non-urgent messages can be sent to your provider as well.   To learn more about what you can do with MyChart, go to [email or web address].    Your next appointment:   12 month(s)  The format for your next appointment:   In Person  Provider:   You may see Quay Burow, MD or one of the following Advanced Practice Providers on your designated Care Team:    Kerin Ransom, PA-C  [place], [place]  Coletta Memos, [place]    Other Instructions

## 2020-05-14 NOTE — Progress Notes (Signed)
05/14/2020 LUN MURO   Apr 21, 1946  [id number]  Primary Physician [name], FNP Primary Cardiologist: Lorretta Harp MD Garret Reddish, [place], [place]  HPI:  [name] is a 74 y.o.  African American female patient of Dr. Jacalyn Lefevre referred for peripheral vascular evaluation. She was seen by Dr. Gladstone Lighter orthopedic surgeon, who referred her here. I last saw her in the office  03/09/2019. Her past history is remarkable for ischemic heart disease status post coronary bypass grafting in March 2012 of the LIMA to LAD, vein to the diagonal branch, obtuse marginal branch and a vein to acute marginal branch. She has normal LV function by 2-D echo. Further problems include hypertension, and hyperlipidemia. She complains of right lower extremity claudication and had seen Dr. Fletcher Anon in the past who thought that her pain was arthritic in nature. Lower extremity arterial Doppler studies performed 07/07/14 revealed a right ABI 0.43 with an occluded distal right SFA and popliteal artery, left ABI 0.73 with a high-frequency signal in the mid left SFA. I performed angiography on her 753/15 revealing an occluded left SFA with one vessel runoff via the peroneal. I performed [employer]bo[employer] directional atherectomy followed by PTA using drug-eluting balloon with excellent angiographic result. Her followup Doppler revealed an increase in her right ABI from 0.4 to .7 with resolution of her claudication symptoms. Since I saw her over 2 years ago she developed claudication in her left leg now. Isuspected thatherpreviouslydocumented lesions in her left external leg artery and SFA have progressed.She underwent angiography by myself 06/07/16 revealing 80% segmental mid left SFA stenosis. I performed [facility] directional atherectomy followed by drug-eluting balloon into plasty. She will vessel runoff via peroneal. She did have 50% distal left common iliac artery stenosis without a gradient. Her follow-up lower  extremity Doppler studies performed 06/24/16 were markedly improved ABI 1.04.  Because of right lower extremity claudication and markedly reduced right ABI of 0.46 she underwent angiography by myself on 02/06/2018 really 90% distal right common femoral artery stenosis, total occlusion in the distal right SFA with one-vessel runoff via peroneal. I performed directional atherectomy followed by drug-eluting balloon angioplasty of the distal SFA occlusion and chocolate balloon angioplasty of the distal right common femoral artery. She had follow-up Doppler studies performed in our office 08/25/2017 revealed marked improvement with a right ABI of0.82. Her claudication has  markedly improved.  Since I saw her a year ago she is done well.    She is complained of some atypical sharp pain in her right ankle and calf which does not sound like claudication.  Her Dopplers performed 11/15/2019 revealed ABIs of 0.88 bilaterally with patent SFAs.  She has a palpable pedal pulse on the right.  Venous Dopplers performed 05/06/2020 showed no evidence of DVT.  She denies chest pain or shortness of breath.  T.   Current Meds  Medication Sig  . albuterol (VENTOLIN HFA) 108 (90 Base) MCG/ACT inhaler INHALE 1 PUFF BY MOUTH EVERY 6 HOURS AS NEEDED FOR SHORTNESS OF BREATH AND FOR WHEEZING  . alclomethasone (ACLOVATE) 0.05 % cream Apply 1 application topically as needed.   Marland Kitchen amLODipine (NORVASC) 10 MG tablet TAKE ONE TABLET BY MOUTH ONCE DAILY  . aspirin EC 81 MG tablet Take 81 mg by mouth daily.  . Calcium Carb-Cholecalciferol (CALCIUM 1000 + D PO) Take 1 tablet by mouth every other day.   . clopidogrel (PLAVIX) 75 MG tablet Take 1 tablet (75 mg total) by mouth daily with  breakfast.  . co-enzyme Q-10 30 MG capsule Take 30 mg by mouth as needed.   . CONSTULOSE 10 GM/15ML solution TAKE 30 ML BY MOUTH TWICE DAILY AS NEEDED  . cyclobenzaprine (FLEXERIL) 5 MG tablet TAKE 1 TABLET BY MOUTH TWICE DAILY AS NEEDED FOR MUSCLE SPASM    . hydrOXYzine (ATARAX/VISTARIL) 25 MG tablet Take 25 mg by mouth 3 (three) times daily.  . Lancets (ONETOUCH DELICA PLUS [id number]) MISC 1 each by Other route 2 (two) times daily.  Marland Kitchen levothyroxine (SYNTHROID, LEVOTHROID) 150 MCG tablet Take 150 mcg by mouth daily before breakfast.  . Multiple Vitamin (MULTIVITAMIN WITH MINERALS) TABS tablet Take 1 tablet by mouth every morning. Centrum  . ONETOUCH VERIO test strip 1 each by Other route 2 (two) times daily.  . pantoprazole (PROTONIX) 40 MG tablet Take 1 tablet (40 mg total) by mouth daily.  Marland Kitchen Propylene Glycol (SYSTANE COMPLETE) 0.6 % SOLN Place 1 drop into both eyes every morning.  . rosuvastatin (CRESTOR) 40 MG tablet Take 1 tablet (40 mg total) by mouth daily.  Marland Kitchen triamcinolone ointment (KENALOG) 0.1 %   . Vitamin D, Ergocalciferol, (DRISDOL) 1.25 MG (50000 UNIT) CAPS capsule Take 50,000 Units by mouth once a week.  . Vitamins/Minerals TABS Take by mouth.   Current Facility-Administered Medications for the 05/14/20 encounter (Office Visit) with Lorretta Harp, MD  Medication  . 0.9 %  sodium chloride infusion     Allergies  Allergen Reactions  . Atorvastatin Other (See Comments)    Muscle aches Other reaction(s): Other (See Comments) Muscle aches    Social History   Socioeconomic History  . Marital status: Divorced    Spouse name: Not on file  . Number of children: 2  . Years of education: 64  . Highest education level: Not on file  Occupational History  . Occupation: Retired    Fish farm manager: RETIRED  Tobacco Use  . Smoking status: Former Smoker    Packs/day: 0.80    Years: 45.00    Pack years: 36.00    Types: Cigarettes    Quit date: 08/23/2009    Years since quitting: 10.7  . Smokeless tobacco: Never Used  Vaping Use  . Vaping Use: Never used  Substance and Sexual Activity  . Alcohol use: Yes    Alcohol/week: 2.0 standard drinks    Types: 2 Glasses of wine per week  . Drug use: Never  . Sexual activity: Not  Currently  Other Topics Concern  . Not on file  Social History Narrative  . Not on file   Social Determinants of Health   Financial Resource Strain:   . Difficulty of Paying Living Expenses: Not on file  Food Insecurity:   . Worried About Charity fundraiser in the Last Year: Not on file  . Ran Out of Food in the Last Year: Not on file  Transportation Needs:   . Lack of Transportation (Medical): Not on file  . Lack of Transportation (Non-Medical): Not on file  Physical Activity:   . Days of Exercise per Week: Not on file  . Minutes of Exercise per Session: Not on file  Stress:   . Feeling of Stress : Not on file  Social Connections:   . Frequency of Communication with Friends and Family: Not on file  . Frequency of Social Gatherings with Friends and Family: Not on file  . Attends Religious Services: Not on file  . Active Member of Clubs or Organizations: Not on file  .  Attends Archivist Meetings: Not on file  . Marital Status: Not on file  Intimate Partner Violence:   . Fear of Current or Ex-Partner: Not on file  . Emotionally Abused: Not on file  . Physically Abused: Not on file  . Sexually Abused: Not on file     Review of Systems: General: negative for chills, fever, night sweats or weight changes.  Cardiovascular: negative for chest pain, dyspnea on exertion, edema, orthopnea, palpitations, paroxysmal nocturnal dyspnea or shortness of breath Dermatological: negative for rash Respiratory: negative for cough or wheezing Urologic: negative for hematuria Abdominal: negative for nausea, vomiting, diarrhea, bright red blood per rectum, melena, or hematemesis Neurologic: negative for visual changes, syncope, or dizziness All other systems reviewed and are otherwise negative except as noted above.    Blood pressure 124/72, pulse 83, height 5\' 3"  (1.6 m), weight 133 lb (60.3 kg), SpO2 97 %.  General appearance: alert and no distress Neck: no adenopathy, no  carotid bruit, no JVD, supple, symmetrical, trachea midline and thyroid not enlarged, symmetric, no tenderness/mass/nodules Lungs: clear to auscultation bilaterally Heart: regular rate and rhythm, S1, S2 normal, no murmur, click, rub or gallop Extremities: extremities normal, atraumatic, no cyanosis or edema Pulses: 2+ and symmetric Skin: Skin color, texture, turgor normal. No rashes or lesions Neurologic: Alert and oriented X 3, normal strength and tone. Normal symmetric reflexes. Normal coordination and gait  EKG sinus rhythm at 83 with nonspecific ST and T wave changes and septal Q waves.  I personally reviewed this EKG.  ASSESSMENT AND PLAN:   PAD (peripheral artery disease) ([place]) History of PAD status post right SFA intervention by myself 02/06/2018 and left SFA intervention 2 years before 06/07/2016.  She had Dopplers performed 11/15/2019 revealing right ABI 0.88 and a left that was the same with widely patent SFAs.  She does complain of some sharp pain in her right ankle and calf.  It is not swollen.  Is not painful to palpation.  She had venous Dopplers performed 05/06/2020 that were negative for DVT.  She does have a palpable pedal pulse on that side.  She says her symptoms are not similar to her claudication symptoms prior to intervention.  I do not think this is related to arterial insufficiency.  We will recheck arterial Dopplers in March of next year 1 year from her prior study and I will see her back in 12 months for follow-up.      Lorretta Harp MD FACP,FACC,FAHA, [facility] 05/14/2020 9:32 AM

## 2020-06-11 NOTE — Progress Notes (Signed)
HPI: FU coronary artery disease.S/P CABGMarch 2012 with a LIMA to the LAD, a sequential saphenous vein graft to the diagonal and obtuse marginal and a saphenous vein graft to the acute marginal. Fu echo in August of 2012 showed an EF of 50-55, atrial septal aneurysm.Last nuclear study April 2019 showed ejection fraction 68% and normal perfusion. Patient followed by Dr. Gwenlyn Found for peripheral vascular disease. Multiple interventions previously. Had atherectomy of distal right SFA June 2019. Carotid Dopplers July 2019 showed 1 to 39% bilateral stenosis.ABIs March 2021 showed mild bilateral disease. Since last seen,she notes some dyspnea on exertion.  No chest pain, palpitations or syncope.  Current Outpatient Medications  Medication Sig Dispense Refill  . amLODipine (NORVASC) 10 MG tablet TAKE ONE TABLET BY MOUTH ONCE DAILY 90 tablet 1  . aspirin EC 81 MG tablet Take 81 mg by mouth daily.    . Calcium Carb-Cholecalciferol (CALCIUM 1000 + D PO) Take 1 tablet by mouth every other day.     . clopidogrel (PLAVIX) 75 MG tablet Take 1 tablet (75 mg total) by mouth daily with breakfast. 90 tablet 3  . Lancets (ONETOUCH DELICA PLUS LYYTKP54S) MISC 1 each by Other route 2 (two) times daily.    Marland Kitchen levothyroxine (SYNTHROID, LEVOTHROID) 150 MCG tablet Take 150 mcg by mouth daily before breakfast.    . Multiple Vitamin (MULTIVITAMIN WITH MINERALS) TABS tablet Take 1 tablet by mouth every morning. Centrum    . ONETOUCH VERIO test strip 1 each by Other route 2 (two) times daily.    Marland Kitchen Propylene Glycol (SYSTANE COMPLETE) 0.6 % SOLN Place 1 drop into both eyes every morning.    . triamcinolone ointment (KENALOG) 0.1 %     . Vitamin D, Ergocalciferol, (DRISDOL) 1.25 MG (50000 UNIT) CAPS capsule Take 50,000 Units by mouth once a week.    . Vitamins/Minerals TABS Take by mouth.    Marland Kitchen albuterol (VENTOLIN HFA) 108 (90 Base) MCG/ACT inhaler INHALE 1 PUFF BY MOUTH EVERY 6 HOURS AS NEEDED FOR SHORTNESS OF BREATH AND  FOR WHEEZING (Patient not taking: Reported on 06/17/2020)    . alclomethasone (ACLOVATE) 0.05 % cream Apply 1 application topically as needed.  (Patient not taking: Reported on 06/17/2020)    . co-enzyme Q-10 30 MG capsule Take 30 mg by mouth as needed.  (Patient not taking: Reported on 06/17/2020)    . CONSTULOSE 10 GM/15ML solution TAKE 30 ML BY MOUTH TWICE DAILY AS NEEDED (Patient not taking: Reported on 06/17/2020)    . cyclobenzaprine (FLEXERIL) 5 MG tablet TAKE 1 TABLET BY MOUTH TWICE DAILY AS NEEDED FOR MUSCLE SPASM (Patient not taking: Reported on 06/17/2020)    . hydrOXYzine (ATARAX/VISTARIL) 25 MG tablet Take 25 mg by mouth 3 (three) times daily. (Patient not taking: Reported on 06/17/2020)    . pantoprazole (PROTONIX) 40 MG tablet Take 1 tablet (40 mg total) by mouth daily. (Patient not taking: Reported on 06/17/2020) 30 tablet 6  . rosuvastatin (CRESTOR) 40 MG tablet Take 1 tablet (40 mg total) by mouth daily. (Patient not taking: Reported on 06/17/2020) 90 tablet 3   Current Facility-Administered Medications  Medication Dose Route Frequency Provider Last Rate Last Admin  . 0.9 %  sodium chloride infusion  500 mL Intravenous Once Milus Banister, MD       Facility-Administered Medications Ordered in Other Visits  Medication Dose Route Frequency Provider Last Rate Last Admin  . tranexamic acid (CYKLOKAPRON) 2,000 mg in sodium chloride 0.9 % 50  mL Topical Application  9,735 mg Topical Once Ardeen Jourdain, PA-C         Past Medical History:  Diagnosis Date  . Anemia   . Anxiety    hx  . Arthritis    "my whole body"  . Atrial septal aneurysm   . COLONIC POLYPS, HX OF   . COPD (chronic obstructive pulmonary disease) ([place])    no per pt on 03/21/2014 & 02/06/2018  . Coronary artery disease    a. s/p CABG 2012.  Marland Kitchen DISC DISEASE, CERVICAL   . Gastroesophageal reflux disease   . Headache    "I have headaches all the time" (02/06/2018)  . Heart murmur    as a child  . History of  rheumatic fever   . Hyperlipidemia   . Hypertension   . Hypothyroidism   . Insomnia   . Osteoarthritis   . OSTEOPENIA   . PAD (peripheral artery disease) ([place])    a. PTA to R SFA 2015. b. PTA to L SFA 05/2016.  Marland Kitchen Plantar fasciitis of left foot 10/02/2019  . Pre-diabetes   . RSD (reflex sympathetic dystrophy) 10/31/2011  . Vitamin D deficiency     Past Surgical History:  Procedure Laterality Date  . ABDOMINAL HYSTERECTOMY  1977   "partial"  . BALLOON ANGIOPLASTY, ARTERY Right 03/21/2014   SFA  . CARDIAC CATHETERIZATION  2011   . CARPAL TUNNEL RELEASE Right 2005  . CATARACT EXTRACTION Left 2013  . CORONARY ARTERY BYPASS GRAFT  10/2009   LIMA to the LAD, saphenous vein graft to the acute marginal, saphenous vein graft to the diagonal and obtuse marginal.  . DILATION AND CURETTAGE OF UTERUS  1978  . JOINT REPLACEMENT    . LOWER EXTREMITY ANGIOGRAM N/A 03/21/2014   Procedure: LOWER EXTREMITY ANGIOGRAM;  Surgeon: Lorretta Harp, MD;  [facility] C[facility] LAB;  Service: Cardiovascular;  Laterality: N/A;  . LOWER EXTREMITY ANGIOGRAM  06/07/2016   Abdominal aortogram/bilateral iliac angiogram/bifemoral runoff  . LOWER EXTREMITY ANGIOGRAPHY N/A 02/06/2018   Procedure: LOWER EXTREMITY ANGIOGRAPHY;  Surgeon: Lorretta Harp, MD;  Location: [place] CV LAB;  Service: Cardiovascular;  Laterality: N/A;  . PERIPHERAL VASCULAR ATHERECTOMY  02/06/2018   Procedure: PERIPHERAL VASCULAR ATHERECTOMY;  Surgeon: Lorretta Harp, MD;  Location: [place] CV LAB;  Service: Cardiovascular;;  . PERIPHERAL VASCULAR CATHETERIZATION Bilateral 06/07/2016   Procedure: Lower Extremity Angiography;  Surgeon: Lorretta Harp, MD;  Location: [place] CV LAB;  Service: Cardiovascular;  Laterality: Bilateral;  . PERIPHERAL VASCULAR CATHETERIZATION Left 06/07/2016   Procedure: Peripheral Vascular Atherectomy;  Surgeon: Lorretta Harp, MD;  Location: [place] [facility] LAB;  Service: Cardiovascular;  Laterality:  Left;  SFA  . PERIPHERAL VASCULAR CATHETERIZATION Left 06/07/2016   Procedure: Peripheral Vascular Balloon Angioplasty;  Surgeon: Lorretta Harp, MD;  Location: [place] [facility] LAB;  Service: Cardiovascular;  Laterality: Left;  SFA  . THYROIDECTOMY  1995  . TONSILLECTOMY    . TOTAL KNEE ARTHROPLASTY Right 06/18/2014   Procedure: RIGHT TOTAL KNEE ARTHROPLASTY;  Surgeon: Tobi Bastos, MD;  Location: [facility];  Service: Orthopedics;  Laterality: Right;    Social History   Socioeconomic History  . Marital status: Divorced    Spouse name: Not on file  . Number of children: 2  . Years of education: 76  . Highest education level: Not on file  Occupational History  . Occupation: Retired    Fish farm manager: RETIRED  Tobacco Use  . Smoking status: Former  Smoker    Packs/day: 0.80    Years: 45.00    Pack years: 36.00    Types: Cigarettes    Quit date: 08/23/2009    Years since quitting: 10.8  . Smokeless tobacco: Never Used  Vaping Use  . Vaping Use: Never used  Substance and Sexual Activity  . Alcohol use: Yes    Alcohol/week: 2.0 standard drinks    Types: 2 Glasses of wine per week  . Drug use: Never  . Sexual activity: Not Currently  Other Topics Concern  . Not on file  Social History Narrative  . Not on file   Social Determinants of Health   Financial Resource Strain:   . Difficulty of Paying Living Expenses: Not on file  Food Insecurity:   . Worried About Charity fundraiser in the Last Year: Not on file  . Ran Out of Food in the Last Year: Not on file  Transportation Needs:   . Lack of Transportation (Medical): Not on file  . Lack of Transportation (Non-Medical): Not on file  Physical Activity:   . Days of Exercise per Week: Not on file  . Minutes of Exercise per Session: Not on file  Stress:   . Feeling of Stress : Not on file  Social Connections:   . Frequency of Communication with Friends and Family: Not on file  . Frequency of Social Gatherings with Friends and  Family: Not on file  . Attends Religious Services: Not on file  . Active Member of Clubs or Organizations: Not on file  . Attends Archivist Meetings: Not on file  . Marital Status: Not on file  Intimate Partner Violence:   . Fear of Current or Ex-Partner: Not on file  . Emotionally Abused: Not on file  . Physically Abused: Not on file  . Sexually Abused: Not on file    Family History  Problem Relation Age of Onset  . Arthritis Maternal Aunt   . Heart disease Maternal Aunt   . Hypertension Maternal Aunt   . Diabetes Maternal Aunt   . Colon cancer Neg Hx   . Kidney disease Neg Hx   . Liver disease Neg Hx   . Throat cancer Neg Hx   . Stomach cancer Neg Hx   . Colon polyps Neg Hx     ROS: Right knee pain the no fevers or chills, productive cough, hemoptysis, dysphasia, odynophagia, melena, hematochezia, dysuria, hematuria, rash, seizure activity, orthopnea, PND, pedal edema, claudication. Remaining systems are negative.  Physical Exam: Well-developed well-nourished in no acute distress.  Skin is warm and dry.  HEENT is normal.  Neck is supple.  Chest is clear to auscultation with normal expansion.  Cardiovascular exam is regular rate and rhythm.  Abdominal exam nontender or distended. No masses palpated. Extremities show no edema. neuro grossly intact  A/P  1 coronary artery disease status post coronary artery bypass graft-patient doing well with no chest pain. Continue medical therapy with aspirin and statin.  2 hypertension-patient's blood pressure is controlled today.  Continue present medications and follow.  Check potassium and renal function  3 hyperlipidemia-continue statin.  Last LDL 111.  Add Zetia 10 mg daily and check lipids and liver in 12 weeks.  4 peripheral vascular disease-patient denies claudication.  Continue medical therapy with aspirin and statin  5 carotid artery disease-mild on most recent Dopplers.  Plan to continue medical  therapy.  Kirk Ruths, MD

## 2020-06-17 ENCOUNTER — Encounter: Payer: Self-pay | Admitting: Cardiology

## 2020-06-17 ENCOUNTER — Ambulatory Visit (INDEPENDENT_AMBULATORY_CARE_PROVIDER_SITE_OTHER): Payer: Medicare Other | Admitting: Cardiology

## 2020-06-17 ENCOUNTER — Other Ambulatory Visit: Payer: Self-pay

## 2020-06-17 VITALS — BP 132/74 | HR 78 | Ht 63.0 in | Wt 135.6 lb

## 2020-06-17 DIAGNOSIS — I739 Peripheral vascular disease, unspecified: Secondary | ICD-10-CM

## 2020-06-17 DIAGNOSIS — E7800 Pure hypercholesterolemia, unspecified: Secondary | ICD-10-CM | POA: Diagnosis not present

## 2020-06-17 DIAGNOSIS — I2510 Atherosclerotic heart disease of native coronary artery without angina pectoris: Secondary | ICD-10-CM | POA: Diagnosis not present

## 2020-06-17 DIAGNOSIS — I10 Essential (primary) hypertension: Secondary | ICD-10-CM

## 2020-06-17 DIAGNOSIS — E785 Hyperlipidemia, unspecified: Secondary | ICD-10-CM

## 2020-06-17 NOTE — Patient Instructions (Signed)
Medication Instructions:   RESTART EZETIMIBE 10 MG ONCE DAILY  *If you need a refill on your cardiac medications before your next appointment, please call your pharmacy*   Lab Work:  Your physician recommends that you return for lab work [place]  If you have labs (blood work) drawn today and your tests are completely normal, you will receive your results only by: Marland Kitchen MyChart Message (if you have MyChart) OR . A paper copy in the mail If you have any lab test that is abnormal or we need to change your treatment, we will call you to review the results.   Follow-Up: At [facility], you and your health needs are our priority.  As part of our continuing mission to provide you with exceptional heart care, we have created designated Provider Care Teams.  These Care Teams include your primary Cardiologist (physician) and Advanced Practice Providers (APPs -  Physician Assistants and Nurse Practitioners) who all work together to provide you with the care you need, when you need it.  We recommend signing up for the patient portal called "MyChart".  Sign up information is provided on this After Visit Summary.  MyChart is used to connect with patients for Virtual Visits (Telemedicine).  Patients are able to view lab/test results, encounter notes, upcoming appointments, etc.  Non-urgent messages can be sent to your provider as well.   To learn more about what you can do with MyChart, go to [email or web address].    Your next appointment:   12 month(s)  The format for your next appointment:   In Person  Provider:   Kirk Ruths, MD

## 2020-06-19 ENCOUNTER — Other Ambulatory Visit: Payer: Self-pay

## 2020-06-19 ENCOUNTER — Ambulatory Visit: Payer: Self-pay

## 2020-06-19 ENCOUNTER — Ambulatory Visit (INDEPENDENT_AMBULATORY_CARE_PROVIDER_SITE_OTHER): Payer: Medicare Other | Admitting: Orthopedic Surgery

## 2020-06-19 ENCOUNTER — Other Ambulatory Visit: Payer: Self-pay | Admitting: Thoracic Surgery (Cardiothoracic Vascular Surgery)

## 2020-06-19 DIAGNOSIS — Z96651 Presence of right artificial knee joint: Secondary | ICD-10-CM

## 2020-06-19 DIAGNOSIS — R918 Other nonspecific abnormal finding of lung field: Secondary | ICD-10-CM

## 2020-06-21 ENCOUNTER — Encounter: Payer: Self-pay | Admitting: Orthopedic Surgery

## 2020-06-21 NOTE — Progress Notes (Signed)
Office Visit Note   Patient: [name]           Date of Birth: 05-May-1946           MRN: [id number] Visit Date: 06/19/2020 Requested by: [name]., FNP [place],  [place] [id number] PCP: [name]., FNP  Subjective: Chief Complaint  Patient presents with  . Right Knee - Pain    HPI: [name] is a 74 year old patient with right knee pain.  She is here for a second opinion.  She went to have her knee looked at and she was told that she had "decreased circulation".  Went to vein and vascular specialist where Doppler ultrasound was done and her venous system is intact.  No studies on her arterial system were observed.  She reports a lot of pain and swelling around the knee and leg.  Hurts her to weight-bear.  She did not have a deep vein thrombosis.  She is prediabetic.  Reports some start up pain.  Mostly she does have some vascular versus neurogenic claudication type symptoms.  Her total knee replacement was done approximately 10 years or so ago by another physician.  Outside notes are reviewed.  She denies any fevers or chills.              ROS: All systems reviewed are negative as they relate to the chief complaint within the history of present illness.  Patient denies  fevers or chills.   Assessment & Plan: Visit Diagnoses:  1. Hx of total knee arthroplasty, right     Plan: Impression is right knee pain with diminished palpable pulses bilaterally no warmth to the prosthesis no effusion.  Plan is bone scan right knee to evaluate for loosening, CT scan right knee to evaluate for nonradiographic lytic lesions which could be indicative of loosening.  Also she needs an ABI of her bilateral lower extremities to evaluate diminished pedal pulses.  Calcification is present in the popliteal vessel also this should be worked up as well.  Follow-Up Instructions: Return in about 2 weeks (around 07/03/2020).   Orders:  Orders Placed This Encounter  Procedures  . XR  KNEE 3 VIEW RIGHT  . NM Bone Scan 3 Phase Lower Extremity  . CT KNEE RIGHT WO CONTRAST  . VAS [place] ABI WITH/WO TBI   No orders of the defined types were placed in this encounter.     Procedures: No procedures performed   Clinical Data: No additional findings.  Objective: Vital Signs: There were no vitals taken for this visit.  Physical Exam:   Constitutional: Patient appears well-developed HEENT:  Head: Normocephalic Eyes:EOM are normal Neck: Normal range of motion Cardiovascular: Normal rate Pulmonary/chest: Effort normal Neurologic: Patient is alert Skin: Skin is warm Psychiatric: Patient has normal mood and affect    Ortho Exam: Ortho exam demonstrates excellent range of motion of the right knee from full extension to about 110 of flexion.  Collaterals are stable.  No mid flexion instability.  Extensor mechanism is intact.  Good stability to varus valgus stress at 0 3090 degrees.  No warmth to the right knee no effusion.  No groin pain with internal extra rotation of the leg.  No nerve root tension signs.  Both feet are warm but pedal pulses are diminished.  Specialty Comments:  No specialty comments available.  Imaging: No results found.   PMFS History: Patient Active Problem List   Diagnosis Date Noted  . Plantar fasciitis  of left foot 10/02/2019  . Status post partial hysterectomy 10/24/2018  . Discharge from the vagina 10/24/2018  . Claudication in peripheral vascular disease ([name]) 02/06/2018  . Chronic idiopathic constipation 10/07/2017  . CAD in native artery 06/08/2016  . Skin inflammation 04/06/2016  . Contracture of hand joint 04/06/2016  . Microhematuria 11/12/2015  . Left shoulder pain 10/15/2015  . Trapezius muscle spasm 09/24/2015  . Type 2 diabetes mellitus ([place]) 08/07/2015  . Urinary frequency 04/04/2015  . External hemorrhoid 11/02/2014  . Left-sided thoracic back pain 10/03/2014  . Gingivitis 10/03/2014  . Routine general medical  examination at a health care facility 09/10/2014  . Total knee replacement status 06/18/2014  . Tooth pain 04/18/2014  . Claudication ([place]) 03/21/2014  . Primary localized osteoarthrosis, lower leg 08/10/2013  . Right knee pain 08/02/2013  . Upper respiratory infection 08/02/2013  . Dyspepsia 11/16/2012  . Knee pain, right 02/18/2012  . PAD (peripheral artery disease) ([place]) 12/03/2011  . PVD (peripheral vascular disease) ([place]) 10/31/2011  . RSD (reflex sympathetic dystrophy) 10/31/2011  . Vitamin D deficiency   . Preop exam for internal medicine 10/28/2011  . Anxiety 03/11/2011  . COPD (chronic obstructive pulmonary disease) ([place]) 02/02/2011  . Anemia 02/02/2011  . History of rheumatic fever 02/02/2011  . Insomnia 02/02/2011  . Coronary atherosclerosis 09/08/2010  . CONSTIPATION 02/19/2010  . TOBACCO ABUSE 07/21/2009  . MURMUR 06/17/2009  . [place] DISEASE, CERVICAL 03/20/2009  . Pain in Soft Tissues of Limb 05/23/2008  . Hypothyroidism 09/07/2007  . Hyperlipidemia LDL goal <70 09/07/2007  . Essential hypertension 09/07/2007  . ALLERGIC RHINITIS 09/07/2007  . GERD 09/07/2007  . Primary osteoarthritis of right knee 09/07/2007  . Osteopenia 09/07/2007  . COLONIC POLYPS, HX OF 09/07/2007   Past Medical History:  Diagnosis Date  . Anemia   . Anxiety    hx  . Arthritis    "my whole body"  . Atrial septal aneurysm   . COLONIC POLYPS, HX OF   . COPD (chronic obstructive pulmonary disease) ([place])    no per pt on 03/21/2014 & 02/06/2018  . Coronary artery disease    a. s/p CABG 2012.  Marland Kitchen DISC DISEASE, CERVICAL   . Gastroesophageal reflux disease   . Headache    "I have headaches all the time" (02/06/2018)  . Heart murmur    as a child  . History of rheumatic fever   . Hyperlipidemia   . Hypertension   . Hypothyroidism   . Insomnia   . Osteoarthritis   . OSTEOPENIA   . PAD (peripheral artery disease) ([place])    a. PTA to R SFA 2015. b. PTA to L SFA 05/2016.  Marland Kitchen Plantar  fasciitis of left foot 10/02/2019  . Pre-diabetes   . RSD (reflex sympathetic dystrophy) 10/31/2011  . Vitamin D deficiency     Family History  Problem Relation Age of Onset  . Arthritis Maternal Aunt   . Heart disease Maternal Aunt   . Hypertension Maternal Aunt   . Diabetes Maternal Aunt   . Colon cancer Neg Hx   . Kidney disease Neg Hx   . Liver disease Neg Hx   . Throat cancer Neg Hx   . Stomach cancer Neg Hx   . Colon polyps Neg Hx     Past Surgical History:  Procedure Laterality Date  . ABDOMINAL HYSTERECTOMY  1977   "partial"  . BALLOON ANGIOPLASTY, ARTERY Right 03/21/2014   SFA  . CARDIAC CATHETERIZATION  2011   .  CARPAL TUNNEL RELEASE Right 2005  . CATARACT EXTRACTION Left 2013  . CORONARY ARTERY BYPASS GRAFT  10/2009   LIMA to the LAD, saphenous vein graft to the acute marginal, saphenous vein graft to the diagonal and obtuse marginal.  . DILATION AND CURETTAGE OF UTERUS  1978  . JOINT REPLACEMENT    . LOWER EXTREMITY ANGIOGRAM N/A 03/21/2014   Procedure: LOWER EXTREMITY ANGIOGRAM;  Surgeon: Lorretta Harp, MD;  Location[facility] CATH LAB;  Service: Cardiovascular;  Laterality: N/A;  . LOWER EXTREMITY ANGIOGRAM  06/07/2016   Abdominal aortogram/bilateral iliac angiogram/bifemoral runoff  . LOWER EXTREMITY ANGIOGRAPHY N/A 02/06/2018   Procedure: LOWER EXTREMITY ANGIOGRAPHY;  Surgeon: Lorretta Harp, MD;  Location: [place] [facility] LAB;  Service: Cardiovascular;  Laterality: N/A;  . PERIPHERAL VASCULAR ATHERECTOMY  02/06/2018   Procedure: PERIPHERAL VASCULAR ATHERECTOMY;  Surgeon: Lorretta Harp, MD;  Location: [place] CV LAB;  Service: Cardiovascular;;  . PERIPHERAL VASCULAR CATHETERIZATION Bilateral 06/07/2016   Procedure: Lower Extremity Angiography;  Surgeon: Lorretta Harp, MD;  Location: [place] CV LAB;  Service: Cardiovascular;  Laterality: Bilateral;  . PERIPHERAL VASCULAR CATHETERIZATION Left 06/07/2016   Procedure: Peripheral Vascular Atherectomy;   Surgeon: Lorretta Harp, MD;  Location: [place] CV LAB;  Service: Cardiovascular;  Laterality: Left;  SFA  . PERIPHERAL VASCULAR CATHETERIZATION Left 06/07/2016   Procedure: Peripheral Vascular Balloon Angioplasty;  Surgeon: Lorretta Harp, MD;  Location: [place] CV LAB;  Service: Cardiovascular;  Laterality: Left;  SFA  . THYROIDECTOMY  1995  . TONSILLECTOMY    . TOTAL KNEE ARTHROPLASTY Right 06/18/2014   Procedure: RIGHT TOTAL KNEE ARTHROPLASTY;  Surgeon: Tobi Bastos, MD;  Location: [facility];  Service: Orthopedics;  Laterality: Right;   Social History   Occupational History  . Occupation: Retired    Fish farm manager: RETIRED  Tobacco Use  . Smoking status: Former Smoker    Packs/day: 0.80    Years: 45.00    Pack years: 36.00    Types: Cigarettes    Quit date: 08/23/2009    Years since quitting: 10.8  . Smokeless tobacco: Never Used  Vaping Use  . Vaping Use: Never used  Substance and Sexual Activity  . Alcohol use: Yes    Alcohol/week: 2.0 standard drinks    Types: 2 Glasses of wine per week  . Drug use: Never  . Sexual activity: Not Currently

## 2020-06-23 NOTE — Progress Notes (Deleted)
Office Visit Note  Patient: [name]             Date of Birth: 12/21/1945           MRN: [id number]             PCP: [name], FNP Referring: [name], FNP Visit Date: 07/07/2020 Occupation: @GUAROCC @  Subjective:  No chief complaint on file.   History of Present Illness: [name] is a 74 y.o. female ***   Activities of Daily Living:  Patient reports morning stiffness for *** {minute/hour:19697}.   Patient {ACTIONS;DENIES/REPORTS:21021675::"Denies"} nocturnal pain.  Difficulty dressing/grooming: {ACTIONS;DENIES/REPORTS:21021675::"Denies"} Difficulty climbing stairs: {ACTIONS;DENIES/REPORTS:21021675::"Denies"} Difficulty getting out of chair: {ACTIONS;DENIES/REPORTS:21021675::"Denies"} Difficulty using hands for taps, buttons, cutlery, and/or writing: {ACTIONS;DENIES/REPORTS:21021675::"Denies"}  No Rheumatology ROS completed.   PMFS History:  Patient Active Problem List   Diagnosis Date Noted  . Plantar fasciitis of left foot 10/02/2019  . Status post partial hysterectomy 10/24/2018  . Discharge from the vagina 10/24/2018  . Claudication in peripheral vascular disease ([place]) 02/06/2018  . Chronic idiopathic constipation 10/07/2017  . CAD in native artery 06/08/2016  . Skin inflammation 04/06/2016  . Contracture of hand joint 04/06/2016  . Microhematuria 11/12/2015  . Left shoulder pain 10/15/2015  . Trapezius muscle spasm 09/24/2015  . Type 2 diabetes mellitus ([place]) 08/07/2015  . Urinary frequency 04/04/2015  . External hemorrhoid 11/02/2014  . Left-sided thoracic back pain 10/03/2014  . Gingivitis 10/03/2014  . Routine general medical examination at a health care facility 09/10/2014  . Total knee replacement status 06/18/2014  . Tooth pain 04/18/2014  . Claudication ([place]) 03/21/2014  . Primary localized osteoarthrosis, lower leg 08/10/2013  . Right knee pain 08/02/2013  . Upper respiratory infection 08/02/2013  . Dyspepsia  11/16/2012  . Knee pain, right 02/18/2012  . PAD (peripheral artery disease) ([place]) 12/03/2011  . PVD (peripheral vascular disease) ([place]) 10/31/2011  . RSD (reflex sympathetic dystrophy) 10/31/2011  . Vitamin D deficiency   . Preop exam for internal medicine 10/28/2011  . Anxiety 03/11/2011  . COPD (chronic obstructive pulmonary disease) ([place]) 02/02/2011  . Anemia 02/02/2011  . History of rheumatic fever 02/02/2011  . Insomnia 02/02/2011  . Coronary atherosclerosis 09/08/2010  . CONSTIPATION 02/19/2010  . TOBACCO ABUSE 07/21/2009  . MURMUR 06/17/2009  . [place] DISEASE, CERVICAL 03/20/2009  . Pain in Soft Tissues of Limb 05/23/2008  . Hypothyroidism 09/07/2007  . Hyperlipidemia LDL goal <70 09/07/2007  . Essential hypertension 09/07/2007  . ALLERGIC RHINITIS 09/07/2007  . GERD 09/07/2007  . Primary osteoarthritis of right knee 09/07/2007  . Osteopenia 09/07/2007  . COLONIC POLYPS, HX OF 09/07/2007    Past Medical History:  Diagnosis Date  . Anemia   . Anxiety    hx  . Arthritis    "my whole body"  . Atrial septal aneurysm   . COLONIC POLYPS, HX OF   . COPD (chronic obstructive pulmonary disease) ([place])    no per pt on 03/21/2014 & 02/06/2018  . Coronary artery disease    a. s/p CABG 2012.  Marland Kitchen DISC DISEASE, CERVICAL   . Gastroesophageal reflux disease   . Headache    "I have headaches all the time" (02/06/2018)  . Heart murmur    as a child  . History of rheumatic fever   . Hyperlipidemia   . Hypertension   . Hypothyroidism   . Insomnia   . Osteoarthritis   . OSTEOPENIA   . PAD (peripheral artery disease) ([place])  a. PTA to R SFA 2015. b. PTA to L SFA 05/2016.  Marland Kitchen Plantar fasciitis of left foot 10/02/2019  . Pre-diabetes   . RSD (reflex sympathetic dystrophy) 10/31/2011  . Vitamin D deficiency     Family History  Problem Relation Age of Onset  . Arthritis Maternal Aunt   . Heart disease Maternal Aunt   . Hypertension Maternal Aunt   . Diabetes Maternal Aunt   .  Colon cancer Neg Hx   . Kidney disease Neg Hx   . Liver disease Neg Hx   . Throat cancer Neg Hx   . Stomach cancer Neg Hx   . Colon polyps Neg Hx    Past Surgical History:  Procedure Laterality Date  . ABDOMINAL HYSTERECTOMY  1977   "partial"  . BALLOON ANGIOPLASTY, ARTERY Right 03/21/2014   SFA  . CARDIAC CATHETERIZATION  2011   . CARPAL TUNNEL RELEASE Right 2005  . CATARACT EXTRACTION Left 2013  . CORONARY ARTERY BYPASS GRAFT  10/2009   LIMA to the LAD, saphenous vein graft to the acute marginal, saphenous vein graft to the diagonal and obtuse marginal.  . DILATION AND CURETTAGE OF UTERUS  1978  . JOINT REPLACEMENT    . LOWER EXTREMITY ANGIOGRAM N/A 03/21/2014   Procedure: LOWER EXTREMITY ANGIOGRAM;  Surgeon: Lorretta Harp, MD;  Location: [facility] CATH LAB;  Service: Cardiovascular;  Laterality: N/A;  . LOWER EXTREMITY ANGIOGRAM  06/07/2016   Abdominal aortogram/bilateral iliac angiogram/bifemoral runoff  . LOWER EXTREMITY ANGIOGRAPHY N/A 02/06/2018   Procedure: LOWER EXTREMITY ANGIOGRAPHY;  Surgeon: Lorretta Harp, MD;  Location: [place] CV LAB;  Service: Cardiovascular;  Laterality: N/A;  . PERIPHERAL VASCULAR ATHERECTOMY  02/06/2018   Procedure: PERIPHERAL VASCULAR ATHERECTOMY;  Surgeon: Lorretta Harp, MD;  Location: [place] CV LAB;  Service: Cardiovascular;;  . PERIPHERAL VASCULAR CATHETERIZATION Bilateral 06/07/2016   Procedure: Lower Extremity Angiography;  Surgeon: Lorretta Harp, MD;  Location: [place] CV LAB;  Service: Cardiovascular;  Laterality: Bilateral;  . PERIPHERAL VASCULAR CATHETERIZATION Left 06/07/2016   Procedure: Peripheral Vascular Atherectomy;  Surgeon: Lorretta Harp, MD;  Location: [place] [facility] LAB;  Service: Cardiovascular;  Laterality: Left;  SFA  . PERIPHERAL VASCULAR CATHETERIZATION Left 06/07/2016   Procedure: Peripheral Vascular Balloon Angioplasty;  Surgeon: Lorretta Harp, MD;  Location: [place] [facility] LAB;  Service:  Cardiovascular;  Laterality: Left;  SFA  . THYROIDECTOMY  1995  . TONSILLECTOMY    . TOTAL KNEE ARTHROPLASTY Right 06/18/2014   Procedure: RIGHT TOTAL KNEE ARTHROPLASTY;  Surgeon: Tobi Bastos, MD;  Location: [facility];  Service: Orthopedics;  Laterality: Right;   Social History   Social History Narrative  . Not on file   Immunization History  Administered Date(s) Administered  . H1N1 09/12/2008  . Influenza Whole 07/24/2007, 05/23/2008, 06/17/2009, 06/16/2010  . Influenza, High Dose Seasonal PF 05/15/2014, 05/22/2017  . Influenza,inj,Quad PF,6+ Mos 05/11/2013, 08/23/2017  . Influenza-Unspecified 03/24/2015, 05/19/2016  . PFIZER SARS-COV-2 Vaccination 09/14/2019, 10/05/2019  . Pneumococcal Conjugate-13 08/02/2013  . Pneumococcal Polysaccharide-23 09/07/2007, 11/16/2012  . Td 09/12/2008  . Zoster 12/12/2007     Objective: Vital Signs: There were no vitals taken for this visit.   Physical Exam   Musculoskeletal Exam: ***  CDAI Exam: CDAI Score: -- Patient Global: --; Provider Global: -- Swollen: --; Tender: -- Joint Exam 07/07/2020   No joint exam has been documented for this visit   There is currently no information documented on the homunculus. Go to the  Rheumatology activity and complete the homunculus joint exam.  Investigation: No additional findings.  Imaging: XR KNEE 3 VIEW RIGHT  Result Date: 06/21/2020 AP lateral merchant right knee reviewed.  Total knee replacement in good position alignment with no obvious complicating features.  No loosening at the bone cement interface.  Alignment intact.  Calcification of the popliteal vessel is noted.  Patella well centered in the trochlear groove.   Recent Labs: Lab Results  Component Value Date   WBC 4.6 02/07/2018   HGB 13.2 02/07/2018   PLT 233 02/07/2018   NA 141 09/05/2019   K 4.3 09/05/2019   CL 104 09/05/2019   CO2 24 09/05/2019   GLUCOSE 102 (H) 09/05/2019   BUN 7 (L) 09/05/2019   CREATININE 0.63  09/05/2019   BILITOT 0.4 03/07/2020   ALKPHOS 101 03/07/2020   AST 14 03/07/2020   ALT 12 03/07/2020   PROT 6.6 03/07/2020   ALBUMIN 4.0 03/07/2020   CALCIUM 9.3 09/05/2019   GFRAA 101 09/05/2019    Speciality Comments: No specialty comments available.  Procedures:  No procedures performed Allergies: Atorvastatin   Assessment / Plan:     Visit Diagnoses: Primary osteoarthritis of both hands - Severe OA of right hand with erosive changes   Plantar fasciitis of left foot  Primary osteoarthritis of right knee  Vitamin D deficiency  Essential hypertension  Atherosclerosis of native coronary artery of native heart without angina pectoris  PVD (peripheral vascular disease) (HCC)  Centrilobular emphysema (HCC)  History of gastroesophageal reflux (GERD)  History of hypothyroidism  History of type 2 diabetes mellitus  RSD (reflex sympathetic dystrophy)  History of rheumatic fever  Orders: No orders of the defined types were placed in this encounter.  No orders of the defined types were placed in this encounter.   Face-to-face time spent with patient was *** minutes. Greater than 50% of time was spent in counseling and coordination of care.  Follow-Up Instructions: No follow-ups on file.   Ofilia Neas, PA-C  Note - This record has been created using Dragon software.  Chart creation errors have been sought, but may not always  have been located. Such creation errors do not reflect on  the standard of medical care.

## 2020-06-30 ENCOUNTER — Other Ambulatory Visit: Payer: Self-pay

## 2020-06-30 ENCOUNTER — Encounter (HOSPITAL_COMMUNITY)
Admission: RE | Admit: 2020-06-30 | Discharge: 2020-06-30 | Disposition: A | Discharge status: Home / Self Care | Payer: Medicare Other | Source: Ambulatory Visit | Attending: Orthopedic Surgery | Admitting: Orthopedic Surgery

## 2020-06-30 ENCOUNTER — Ambulatory Visit (HOSPITAL_COMMUNITY): Payer: Medicare Other

## 2020-06-30 DIAGNOSIS — Z96651 Presence of right artificial knee joint: Secondary | ICD-10-CM

## 2020-06-30 MED ORDER — TECHNETIUM TC 99M MEDRONATE IV KIT
21.3000 | PACK | Freq: Once | INTRAVENOUS | Status: AC | PRN
  Administered 2020-06-30: 21.3 via INTRAVENOUS

## 2020-06-30 MED ORDER — TECHNETIUM TC 99M MEBROFENIN IV KIT: 5.1000 | PACK | Freq: Once | INTRAVENOUS | Status: DC | PRN

## 2020-07-01 ENCOUNTER — Ambulatory Visit: Payer: Medicare Other | Admitting: Podiatry

## 2020-07-07 ENCOUNTER — Ambulatory Visit: Payer: Medicare Other | Admitting: Rheumatology

## 2020-07-07 DIAGNOSIS — M19041 Primary osteoarthritis, right hand: Secondary | ICD-10-CM

## 2020-07-07 DIAGNOSIS — J432 Centrilobular emphysema: Secondary | ICD-10-CM

## 2020-07-07 DIAGNOSIS — Z8639 Personal history of other endocrine, nutritional and metabolic disease: Secondary | ICD-10-CM

## 2020-07-07 DIAGNOSIS — E559 Vitamin D deficiency, unspecified: Secondary | ICD-10-CM

## 2020-07-07 DIAGNOSIS — I2510 Atherosclerotic heart disease of native coronary artery without angina pectoris: Secondary | ICD-10-CM

## 2020-07-07 DIAGNOSIS — M722 Plantar fascial fibromatosis: Secondary | ICD-10-CM

## 2020-07-07 DIAGNOSIS — Z8679 Personal history of other diseases of the circulatory system: Secondary | ICD-10-CM

## 2020-07-07 DIAGNOSIS — I10 Essential (primary) hypertension: Secondary | ICD-10-CM

## 2020-07-07 DIAGNOSIS — G9050 Complex regional pain syndrome I, unspecified: Secondary | ICD-10-CM

## 2020-07-07 DIAGNOSIS — Z8719 Personal history of other diseases of the digestive system: Secondary | ICD-10-CM

## 2020-07-07 DIAGNOSIS — I739 Peripheral vascular disease, unspecified: Secondary | ICD-10-CM

## 2020-07-07 DIAGNOSIS — M1711 Unilateral primary osteoarthritis, right knee: Secondary | ICD-10-CM

## 2020-07-10 ENCOUNTER — Other Ambulatory Visit: Payer: Medicare Other

## 2020-07-11 ENCOUNTER — Encounter: Payer: Self-pay | Admitting: Podiatry

## 2020-07-11 ENCOUNTER — Other Ambulatory Visit: Payer: Self-pay

## 2020-07-11 ENCOUNTER — Ambulatory Visit (INDEPENDENT_AMBULATORY_CARE_PROVIDER_SITE_OTHER): Payer: Medicare Other | Admitting: Podiatry

## 2020-07-11 DIAGNOSIS — L84 Corns and callosities: Secondary | ICD-10-CM

## 2020-07-11 DIAGNOSIS — E1151 Type 2 diabetes mellitus with diabetic peripheral angiopathy without gangrene: Secondary | ICD-10-CM

## 2020-07-11 DIAGNOSIS — B351 Tinea unguium: Secondary | ICD-10-CM

## 2020-07-11 DIAGNOSIS — M79676 Pain in unspecified toe(s): Secondary | ICD-10-CM | POA: Diagnosis not present

## 2020-07-13 NOTE — Progress Notes (Signed)
Subjective: [name] presents today at risk foot care. Pt has h/o NIDDM with PAD and callus(es) right hallux and painful mycotic toenails b/l that are difficult to trim. Pain interferes with ambulation. Aggravating factors include wearing enclosed shoe gear. Pain is relieved with periodic professional debridement.   She voices no new pedal problems on today's visit.  [name], FNP is patient's PCP.  Last visit was 10/08/2019.  Past Medical History:  Diagnosis Date  . Anemia   . Anxiety    hx  . Arthritis    "my whole body"  . Atrial septal aneurysm   . COLONIC POLYPS, HX OF   . COPD (chronic obstructive pulmonary disease) ([place])    no per pt on 03/21/2014 & 02/06/2018  . Coronary artery disease    a. s/p CABG 2012.  Marland Kitchen DISC DISEASE, CERVICAL   . Gastroesophageal reflux disease   . Headache    "I have headaches all the time" (02/06/2018)  . Heart murmur    as a child  . History of rheumatic fever   . Hyperlipidemia   . Hypertension   . Hypothyroidism   . Insomnia   . Osteoarthritis   . OSTEOPENIA   . PAD (peripheral artery disease) ([place])    a. PTA to R SFA 2015. b. PTA to L SFA 05/2016.  Marland Kitchen Plantar fasciitis of left foot 10/02/2019  . Pre-diabetes   . RSD (reflex sympathetic dystrophy) 10/31/2011  . Vitamin D deficiency      Current Outpatient Medications on File Prior to Visit  Medication Sig Dispense Refill  . albuterol (VENTOLIN HFA) 108 (90 Base) MCG/ACT inhaler INHALE 1 PUFF BY MOUTH EVERY 6 HOURS AS NEEDED FOR SHORTNESS OF BREATH AND FOR WHEEZING    . alclomethasone (ACLOVATE) 0.05 % cream Apply 1 application topically as needed.     Marland Kitchen amLODipine (NORVASC) 10 MG tablet TAKE ONE TABLET BY MOUTH ONCE DAILY 90 tablet 1  . aspirin EC 81 MG tablet Take 81 mg by mouth daily.    . Calcium Carb-Cholecalciferol (CALCIUM 1000 + D PO) Take 1 tablet by mouth every other day.     . clopidogrel (PLAVIX) 75 MG tablet Take 1 tablet (75 mg total) by mouth daily with  breakfast. 90 tablet 3  . co-enzyme Q-10 30 MG capsule Take 30 mg by mouth as needed.     . CONSTULOSE 10 GM/15ML solution TAKE 30 ML BY MOUTH TWICE DAILY AS NEEDED    . cyclobenzaprine (FLEXERIL) 5 MG tablet TAKE 1 TABLET BY MOUTH TWICE DAILY AS NEEDED FOR MUSCLE SPASM    . ezetimibe (ZETIA) 10 MG tablet Take 10 mg by mouth daily.    . hydrOXYzine (ATARAX/VISTARIL) 25 MG tablet Take 25 mg by mouth 3 (three) times daily.     . Lancets (ONETOUCH DELICA PLUS [id number]) MISC 1 each by Other route 2 (two) times daily.    Marland Kitchen levothyroxine (SYNTHROID, LEVOTHROID) 150 MCG tablet Take 150 mcg by mouth daily before breakfast.    . LINZESS 290 MCG CAPS capsule Take 290 mcg by mouth daily.    . Multiple Vitamin (MULTIVITAMIN WITH MINERALS) TABS tablet Take 1 tablet by mouth every morning. Centrum    . nystatin cream (MYCOSTATIN) Apply topically.    Glory Rosebush VERIO test strip 1 each by Other route 2 (two) times daily.    . pantoprazole (PROTONIX) 40 MG tablet Take 1 tablet (40 mg total) by mouth daily. 30 tablet 6  . Propylene  Glycol (SYSTANE COMPLETE) 0.6 % SOLN Place 1 drop into both eyes every morning.    . rosuvastatin (CRESTOR) 40 MG tablet Take 1 tablet (40 mg total) by mouth daily. 90 tablet 3  . triamcinolone ointment (KENALOG) 0.1 %     . Vitamin D, Ergocalciferol, (DRISDOL) 1.25 MG (50000 UNIT) CAPS capsule Take 50,000 Units by mouth once a week.    . Vitamins/Minerals TABS Take by mouth.     Current Facility-Administered Medications on File Prior to Visit  Medication Dose Route Frequency Provider Last Rate Last Admin  . 0.9 %  sodium chloride infusion  500 mL Intravenous Once Milus Banister, MD      . tranexamic acid (CYKLOKAPRON) 2,000 mg in sodium chloride 0.9 % 50 mL Topical Application  3,546 mg Topical Once Ardeen Jourdain, PA-C         Allergies  Allergen Reactions  . Atorvastatin Other (See Comments)    Muscle aches Other reaction(s): Other (See Comments) Muscle aches     Objective: [name] is a pleasant 74 y.o. African American female, WD, WN in NAD. AAO x 3.   No significant changes to physical examination on today's visit.  Vascular Examination: Capillary refill time to digits immediate b/l. Faintly palpable pedal pulses b/l. Pedal hair present b/l. Skin temperature gradient within normal limits b/l.  Dermatological Examination: Pedal skin with normal turgor, texture and tone bilaterally. No open wounds bilaterally. Toenails 1-5 b/l elongated, dystrophic, thickened, crumbly with subungual debris and tenderness to dorsal palpation. Hyperkeratotic lesion(s) R hallux.  No erythema, no edema, no drainage, no flocculence.  Musculoskeletal: Normal muscle strength 5/5 to all lower extremity muscle groups bilaterally. No pain crepitus or joint limitation noted with ROM b/l. Hallux valgus with bunion deformity noted b/l.  Neurological Examination: Protective sensation intact 5/5 intact bilaterally with 10g monofilament b/l. Vibratory sensation intact b/l.  Assessment: 1. Pain due to onychomycosis of toenail   2. Callus   3. Type II diabetes mellitus with peripheral circulatory disorder (HCC)      Plan: -Examined patient. -No new findings. No new orders. -Continue diabetic foot care principles. Literature dispensed on today.  -Toenails 1-5 b/l were debrided in length and girth with sterile nail nippers and dremel without iatrogenic bleeding.  -Callus(es) R hallux pared utilizing sterile scalpel blade without complication or incident. Total number debrided =1. -Patient to continue soft, supportive shoe gear daily. -Patient to report any pedal injuries to medical professional immediately. -Patient/POA to call should there be question/concern in the interim.  Return in about 3 months (around 10/11/2020) for diabetic foot care.  Marzetta Board, DPM

## 2020-07-15 ENCOUNTER — Ambulatory Visit: Payer: Medicare Other | Admitting: Thoracic Surgery (Cardiothoracic Vascular Surgery)

## 2020-07-16 ENCOUNTER — Ambulatory Visit (INDEPENDENT_AMBULATORY_CARE_PROVIDER_SITE_OTHER): Payer: Medicare Other | Admitting: Orthopedic Surgery

## 2020-07-16 DIAGNOSIS — R0989 Other specified symptoms and signs involving the circulatory and respiratory systems: Secondary | ICD-10-CM

## 2020-07-16 DIAGNOSIS — Z96651 Presence of right artificial knee joint: Secondary | ICD-10-CM

## 2020-07-19 ENCOUNTER — Encounter: Payer: Self-pay | Admitting: Orthopedic Surgery

## 2020-07-19 NOTE — Progress Notes (Signed)
Office Visit Note   Patient: [name]           Date of Birth: 1945-11-20           MRN: [id number] Visit Date: 07/16/2020 Requested by: [name], FNP [place],  [place] [address] PCP: [name], FNP  Subjective: Chief Complaint  Patient presents with  . bone scan review    HPI: [name] is a 74 y.o. female who presents to the office to review bone scan.  Patient is s/p right total knee arthroplasty about 10 years ago.  She had a bone scan of the right lower extremity to evaluate for loosening of the prosthesis versus infection.  Bone scan was negative for any focal persistent localized uptake to suggest infection or loosening.  She denies any fevers, chills, night sweats, draining sinus tract around the incision.  She has been mostly complaining of pain around the quadricep tendon of the right knee..                ROS: All systems reviewed are negative as they relate to the chief complaint within the history of present illness.  Patient denies fevers or chills.  Assessment & Plan: Visit Diagnoses:  1. Hx of total knee arthroplasty, right     Plan: Patient is a 74 year old female who presents complaining of right knee pain.  She has had right total knee arthroplasty about 10 years ago and she has had increased pain in the right knee.  No systemic signs of infection or signs of infection on exam.  She had bone scan of the right lower extremity that was found to be negative for any concerning findings.  She has not had CT scan of the right knee or ABIs.  ABIs were recommended on last clinic visit.  She does have tenderness over the quadricep tendon and states that her pain is mostly centered around this location.  She has pain with performing straight leg raises though she is able to perform them easily without any extensor lag.  With no systemic signs of infection, signs of loosening on bone scan or radiographs, plan for patient to follow-up for  repeat office visit in 1 year for recheck.  Follow-Up Instructions: No follow-ups on file.   Orders:  No orders of the defined types were placed in this encounter.  No orders of the defined types were placed in this encounter.     Procedures: No procedures performed   Clinical Data: No additional findings.  Objective: Vital Signs: There were no vitals taken for this visit.  Physical Exam:  Constitutional: Patient appears well-developed HEENT:  Head: Normocephalic Eyes:EOM are normal Neck: Normal range of motion Cardiovascular: Normal rate Pulmonary/chest: Effort normal Neurologic: Patient is alert Skin: Skin is warm Psychiatric: Patient has normal mood and affect  Ortho Exam: Ortho exam demonstrates right knee without significant effusion.  No increased warmth compared with the contralateral side.  No pain with passive range of motion of the right knee.  Able to perform straight leg raise with no extensor lag.  Tenderness over the quadricep tendon.  No tenderness over the patellar tendon.  No tenderness over the patella.  No significant instability in flexion or extension.  Specialty Comments:  No specialty comments available.  Imaging: No results found.   PMFS History: Patient Active Problem List   Diagnosis Date Noted  . Plantar fasciitis of left foot 10/02/2019  . Status post partial hysterectomy  10/24/2018  . Discharge from the vagina 10/24/2018  . Claudication in peripheral vascular disease ([place]) 02/06/2018  . Chronic idiopathic constipation 10/07/2017  . CAD in native artery 06/08/2016  . Skin inflammation 04/06/2016  . Contracture of hand joint 04/06/2016  . Microhematuria 11/12/2015  . Left shoulder pain 10/15/2015  . Trapezius muscle spasm 09/24/2015  . Type 2 diabetes mellitus ([place]) 08/07/2015  . Urinary frequency 04/04/2015  . External hemorrhoid 11/02/2014  . Left-sided thoracic back pain 10/03/2014  . Gingivitis 10/03/2014  . Routine general  medical examination at a health care facility 09/10/2014  . Total knee replacement status 06/18/2014  . Tooth pain 04/18/2014  . Claudication ([place]) 03/21/2014  . Primary localized osteoarthrosis, lower leg 08/10/2013  . Right knee pain 08/02/2013  . Upper respiratory infection 08/02/2013  . Dyspepsia 11/16/2012  . Knee pain, right 02/18/2012  . PAD (peripheral artery disease) ([place]) 12/03/2011  . PVD (peripheral vascular disease) ([place]) 10/31/2011  . RSD (reflex sympathetic dystrophy) 10/31/2011  . Vitamin D deficiency   . Preop exam for internal medicine 10/28/2011  . Anxiety 03/11/2011  . COPD (chronic obstructive pulmonary disease) ([place]) 02/02/2011  . Anemia 02/02/2011  . History of rheumatic fever 02/02/2011  . Insomnia 02/02/2011  . Coronary atherosclerosis 09/08/2010  . CONSTIPATION 02/19/2010  . TOBACCO ABUSE 07/21/2009  . MURMUR 06/17/2009  . [place] DISEASE, CERVICAL 03/20/2009  . Pain in Soft Tissues of Limb 05/23/2008  . Hypothyroidism 09/07/2007  . Hyperlipidemia LDL goal <70 09/07/2007  . Essential hypertension 09/07/2007  . ALLERGIC RHINITIS 09/07/2007  . GERD 09/07/2007  . Primary osteoarthritis of right knee 09/07/2007  . Osteopenia 09/07/2007  . COLONIC POLYPS, HX OF 09/07/2007   Past Medical History:  Diagnosis Date  . Anemia   . Anxiety    hx  . Arthritis    "my whole body"  . Atrial septal aneurysm   . COLONIC POLYPS, HX OF   . COPD (chronic obstructive pulmonary disease) ([place])    no per pt on 03/21/2014 & 02/06/2018  . Coronary artery disease    a. s/p CABG 2012.  Marland Kitchen DISC DISEASE, CERVICAL   . Gastroesophageal reflux disease   . Headache    "I have headaches all the time" (02/06/2018)  . Heart murmur    as a child  . History of rheumatic fever   . Hyperlipidemia   . Hypertension   . Hypothyroidism   . Insomnia   . Osteoarthritis   . OSTEOPENIA   . PAD (peripheral artery disease) ([place])    a. PTA to R SFA 2015. b. PTA to L SFA 05/2016.  Marland Kitchen  Plantar fasciitis of left foot 10/02/2019  . Pre-diabetes   . RSD (reflex sympathetic dystrophy) 10/31/2011  . Vitamin D deficiency     Family History  Problem Relation Age of Onset  . Arthritis Maternal Aunt   . Heart disease Maternal Aunt   . Hypertension Maternal Aunt   . Diabetes Maternal Aunt   . Colon cancer Neg Hx   . Kidney disease Neg Hx   . Liver disease Neg Hx   . Throat cancer Neg Hx   . Stomach cancer Neg Hx   . Colon polyps Neg Hx     Past Surgical History:  Procedure Laterality Date  . ABDOMINAL HYSTERECTOMY  1977   "partial"  . BALLOON ANGIOPLASTY, ARTERY Right 03/21/2014   SFA  . CARDIAC CATHETERIZATION  2011   . CARPAL TUNNEL RELEASE Right 2005  . CATARACT EXTRACTION Left  2013  . CORONARY ARTERY BYPASS GRAFT  10/2009   LIMA to the LAD, saphenous vein graft to the acute marginal, saphenous vein graft to the diagonal and obtuse marginal.  . DILATION AND CURETTAGE OF UTERUS  1978  . JOINT REPLACEMENT    . LOWER EXTREMITY ANGIOGRAM N/A 03/21/2014   Procedure: LOWER EXTREMITY ANGIOGRAM;  Surgeon: Lorretta Harp, MD;  Location: [facility] CATH LAB;  Service: Cardiovascular;  Laterality: N/A;  . LOWER EXTREMITY ANGIOGRAM  06/07/2016   Abdominal aortogram/bilateral iliac angiogram/bifemoral runoff  . LOWER EXTREMITY ANGIOGRAPHY N/A 02/06/2018   Procedure: LOWER EXTREMITY ANGIOGRAPHY;  Surgeon: Lorretta Harp, MD;  Location: [place] [facility] LAB;  Service: Cardiovascular;  Laterality: N/A;  . PERIPHERAL VASCULAR ATHERECTOMY  02/06/2018   Procedure: PERIPHERAL VASCULAR ATHERECTOMY;  Surgeon: Lorretta Harp, MD;  Location: [place] CV LAB;  Service: Cardiovascular;;  . PERIPHERAL VASCULAR CATHETERIZATION Bilateral 06/07/2016   Procedure: Lower Extremity Angiography;  Surgeon: Lorretta Harp, MD;  Location: [facility] CV LAB;  Service: Cardiovascular;  Laterality: Bilateral;  . PERIPHERAL VASCULAR CATHETERIZATION Left 06/07/2016   Procedure: Peripheral Vascular  Atherectomy;  Surgeon: Lorretta Harp, MD;  Location: [place] CV LAB;  Service: Cardiovascular;  Laterality: Left;  SFA  . PERIPHERAL VASCULAR CATHETERIZATION Left 06/07/2016   Procedure: Peripheral Vascular Balloon Angioplasty;  Surgeon: Lorretta Harp, MD;  Location: [place] [facility] LAB;  Service: Cardiovascular;  Laterality: Left;  SFA  . THYROIDECTOMY  1995  . TONSILLECTOMY    . TOTAL KNEE ARTHROPLASTY Right 06/18/2014   Procedure: RIGHT TOTAL KNEE ARTHROPLASTY;  Surgeon: Tobi Bastos, MD;  Location: [facility];  Service: Orthopedics;  Laterality: Right;   Social History   Occupational History  . Occupation: Retired    Fish farm manager: RETIRED  Tobacco Use  . Smoking status: Former Smoker    Packs/day: 0.80    Years: 45.00    Pack years: 36.00    Types: Cigarettes    Quit date: 08/23/2009    Years since quitting: 10.9  . Smokeless tobacco: Never Used  Vaping Use  . Vaping Use: Never used  Substance and Sexual Activity  . Alcohol use: Yes    Alcohol/week: 2.0 standard drinks    Types: 2 Glasses of wine per week  . Drug use: Never  . Sexual activity: Not Currently

## 2020-07-21 NOTE — Progress Notes (Signed)
Do not see order. But did submit order today.

## 2020-07-21 NOTE — Addendum Note (Signed)
Addended byLaurann Montana on: 07/21/2020 10:08 AM   Modules accepted: Orders

## 2020-07-28 ENCOUNTER — Telehealth: Payer: Self-pay | Admitting: Orthopedic Surgery

## 2020-07-28 DIAGNOSIS — R0989 Other specified symptoms and signs involving the circulatory and respiratory systems: Secondary | ICD-10-CM

## 2020-07-28 NOTE — Telephone Encounter (Signed)
Patient called requesting a call back about a referral sent. Patient cant remember what the referral is for. Please call patient at [phone].

## 2020-07-29 NOTE — Telephone Encounter (Signed)
I called patient and explained ABI's have been ordered and why.  She would like to get this done as soon as possible because her leg is continuing to swell and be painful.   Sabrina-can you check on scheduling?

## 2020-07-31 NOTE — Telephone Encounter (Signed)
I saw it also in CV orders but it needs to be in orders for imaging to see it. I looked in ancillary orders and dont see it.

## 2020-07-31 NOTE — Telephone Encounter (Signed)
It looks like it was ordered from Dr. Randel Pigg 07/16/2020 office note.  Can you see it there? I see it listed under CV procedures.

## 2020-07-31 NOTE — Telephone Encounter (Signed)
The scheduler and I have been looking and dont see a recent order for ABI, was a new order placed?

## 2020-07-31 NOTE — Telephone Encounter (Signed)
New order has been entered. Please let me know if this does not work. Thanks.

## 2020-07-31 NOTE — Telephone Encounter (Signed)
Got it!! Thanks. That's why we havent been able to schedule, couldn't find it in orders. Thanks [employer]

## 2020-08-13 ENCOUNTER — Ambulatory Visit (HOSPITAL_COMMUNITY)
Admission: RE | Admit: 2020-08-13 | Discharge: 2020-08-13 | Disposition: A | Discharge status: Home / Self Care | Payer: Medicare Other | Source: Ambulatory Visit | Attending: Orthopedic Surgery | Admitting: Orthopedic Surgery

## 2020-08-13 DIAGNOSIS — R0989 Other specified symptoms and signs involving the circulatory and respiratory systems: Secondary | ICD-10-CM | POA: Diagnosis present

## 2020-08-21 ENCOUNTER — Other Ambulatory Visit: Payer: Self-pay

## 2020-08-21 ENCOUNTER — Ambulatory Visit
Admission: RE | Admit: 2020-08-21 | Discharge: 2020-08-21 | Disposition: A | Discharge status: Home / Self Care | Payer: Medicare Other | Source: Ambulatory Visit | Attending: Thoracic Surgery (Cardiothoracic Vascular Surgery) | Admitting: Thoracic Surgery (Cardiothoracic Vascular Surgery)

## 2020-08-21 DIAGNOSIS — R918 Other nonspecific abnormal finding of lung field: Secondary | ICD-10-CM

## 2020-08-26 ENCOUNTER — Encounter: Payer: Self-pay | Admitting: Thoracic Surgery (Cardiothoracic Vascular Surgery)

## 2020-08-26 ENCOUNTER — Other Ambulatory Visit: Payer: Self-pay

## 2020-08-26 ENCOUNTER — Ambulatory Visit: Payer: Medicare HMO | Admitting: Thoracic Surgery (Cardiothoracic Vascular Surgery)

## 2020-08-26 VITALS — BP 132/72 | HR 92 | Temp 97.6°F | Resp 20 | Ht 63.0 in | Wt 133.0 lb

## 2020-08-26 DIAGNOSIS — R918 Other nonspecific abnormal finding of lung field: Secondary | ICD-10-CM | POA: Diagnosis not present

## 2020-08-26 NOTE — Progress Notes (Signed)
[address]       [place] [address]             [phone]    HPI: [name] returns for a scheduled follow-up visit regarding lung nodules.  [name] is a 75 year old woman with a past history of tobacco abuse (quit 2011), COPD, lung nodules, CAD, coronary bypass grafting in 2012, hypertension, and gastroesophageal reflux.  She was found to have a possible right apical mass on a CT of the cervical spine in 2011.  CT of the chest showed bilateral apical pleural parenchymal densities which were not hypermetabolic on PET.  She had multiple other small lung nodules that have been followed since then.  I last saw her in May 2021.  There was a nodule in the right upper lobe that appeared to be slightly larger.  She now returns for 85-month follow-up.  She says that she occasionally gets short of breath with exertion.  More rarely she has some discomfort in her chest.  Past Medical History:  Diagnosis Date  . Anemia   . Anxiety    hx  . Arthritis    "my whole body"  . Atrial septal aneurysm   . COLONIC POLYPS, HX OF   . COPD (chronic obstructive pulmonary disease) ([place])    no per pt on 03/21/2014 & 02/06/2018  . Coronary artery disease    a. s/p CABG 2012.  Marland Kitchen DISC DISEASE, CERVICAL   . Gastroesophageal reflux disease   . Headache    "I have headaches all the time" (02/06/2018)  . Heart murmur    as a child  . History of rheumatic fever   . Hyperlipidemia   . Hypertension   . Hypothyroidism   . Insomnia   . Osteoarthritis   . OSTEOPENIA   . PAD (peripheral artery disease) ([place])    a. PTA to R SFA 2015. b. PTA to L SFA 05/2016.  Marland Kitchen Plantar fasciitis of left foot 10/02/2019  . Pre-diabetes   . RSD (reflex sympathetic dystrophy) 10/31/2011  . Vitamin D deficiency     Current Outpatient Medications  Medication Sig Dispense Refill  . albuterol (VENTOLIN HFA) 108 (90 Base) MCG/ACT inhaler INHALE 1 PUFF BY MOUTH EVERY 6 HOURS AS NEEDED FOR SHORTNESS OF  BREATH AND FOR WHEEZING    . alclomethasone (ACLOVATE) 0.05 % cream Apply 1 application topically as needed.     Marland Kitchen amLODipine (NORVASC) 10 MG tablet TAKE ONE TABLET BY MOUTH ONCE DAILY 90 tablet 1  . aspirin EC 81 MG tablet Take 81 mg by mouth daily.    . Calcium Carb-Cholecalciferol (CALCIUM 1000 + D PO) Take 1 tablet by mouth every other day.    . clopidogrel (PLAVIX) 75 MG tablet Take 1 tablet (75 mg total) by mouth daily with breakfast. 90 tablet 3  . co-enzyme Q-10 30 MG capsule Take 30 mg by mouth as needed.     . CONSTULOSE 10 GM/15ML solution TAKE 30 ML BY MOUTH TWICE DAILY AS NEEDED    . cyclobenzaprine (FLEXERIL) 5 MG tablet TAKE 1 TABLET BY MOUTH TWICE DAILY AS NEEDED FOR MUSCLE SPASM    . ezetimibe (ZETIA) 10 MG tablet Take 10 mg by mouth daily.    . hydrOXYzine (ATARAX/VISTARIL) 25 MG tablet Take 25 mg by mouth 3 (three) times daily.     . Lancets (ONETOUCH DELICA PLUS [id number]) MISC 1 each by Other route 2 (two) times daily.    Marland Kitchen  levothyroxine (SYNTHROID, LEVOTHROID) 150 MCG tablet Take 150 mcg by mouth daily before breakfast.    . LINZESS 290 MCG CAPS capsule Take 290 mcg by mouth daily.    . Multiple Vitamin (MULTIVITAMIN WITH MINERALS) TABS tablet Take 1 tablet by mouth every morning. Centrum    . nystatin cream (MYCOSTATIN) Apply topically.    Glory Rosebush VERIO test strip 1 each by Other route 2 (two) times daily.    . pantoprazole (PROTONIX) 40 MG tablet Take 1 tablet (40 mg total) by mouth daily. 30 tablet 6  . Propylene Glycol 0.6 % SOLN Place 1 drop into both eyes every morning.    . rosuvastatin (CRESTOR) 40 MG tablet Take 1 tablet (40 mg total) by mouth daily. 90 tablet 3  . triamcinolone ointment (KENALOG) 0.1 %     . Vitamin D, Ergocalciferol, (DRISDOL) 1.25 MG (50000 UNIT) CAPS capsule Take 50,000 Units by mouth once a week.    . Vitamins/Minerals TABS Take by mouth.     Current Facility-Administered Medications  Medication Dose Route Frequency Provider Last Rate  Last Admin  . 0.9 %  sodium chloride infusion  500 mL Intravenous Once Milus Banister, MD       Facility-Administered Medications Ordered in Other Visits  Medication Dose Route Frequency Provider Last Rate Last Admin  . tranexamic acid (CYKLOKAPRON) 2,000 mg in sodium chloride 0.9 % 50 mL Topical Application  3,474 mg Topical Once Constable, Amber, PA-C        Physical Exam BP 132/72   Pulse 92   Temp 97.6 F (36.4 C) (Skin)   Resp 20   Ht 5\' 3"  (1.6 m)   Wt 133 lb (60.3 kg)   SpO2 93% Comment: RA  BMI 23.20 kg/m  75 year old woman in no acute distress Alert oriented x3 no focal deficits No cervical or supraclavicular adenopathy Lungs clear Cardiac regular rate and rhythm with normal S1 and S2 with no rubs or murmurs  Diagnostic Tests: CT CHEST WITHOUT CONTRAST  TECHNIQUE: Multidetector CT imaging of the chest was performed following the standard protocol without IV contrast.  COMPARISON:  12/17/2019  FINDINGS: Cardiovascular: Heart size is within normal limits. Previous median sternotomy and CABG procedure. Aortic atherosclerosis.  Mediastinum/Nodes: Thyroidectomy. The trachea appears patent and is midline. Normal appearance of the esophagus. No supraclavicular, axillary or mediastinal adenopathy.  Lungs/Pleura: Moderate to severe centrilobular and paraseptal emphysema. No pleural effusion, airspace consolidation or atelectasis. Scattered lung nodules are again noted:  Posterior right upper lobe nodule measures 3 mm, image 48/8. Unchanged  The previously characterized enlarging posterior right upper lobe lung nodule appears to represent either 2 adjacent nodules or a single enlarging bilobed nodule. On 11/08/2017 this consisted of a single nodular component measuring 4 mm, which has increased to 6 mm on today's study. The adjacent component is new from 11/08/2017 measuring 4 mm. Since 12/17/2019 there has been no significant change in the appearance of  this lesion.  2 mm nodule in the superior segment of right lower lobe is stable, image 62/8.  Anterior basal right upper lobe nodule is stable measuring 4 mm, image 63/8.  Persistent, but stable 9 mm pure ground-glass nodule in the lateral right lower lobe is unchanged, image 91/8.  Posterolateral right lung base nodule is unchanged measuring 3 mm, image 112/8.  4 mm lingular ground-glass nodule is unchanged, additional scattered calcified and noncalcified punctate nodules are unchanged.  Stable pleuroparenchymal scarring within the lung apices, image 22/8.  Upper Abdomen: Stable  8 mm low-density structure in left hepatic lobe, image 136/2. Posterior right hepatic lobe cyst measures 2.1 cm the, image 152/2. Cholecystectomy. Aortic atherosclerosis. Similar appearance of bilateral adrenal thickening.  Musculoskeletal: No acute abnormality.  IMPRESSION: 1. Previously described enlarging nodule within the posterior right upper lobe appears unchanged from 12/17/2019, but has clearly increased in size from 11/08/2017. Continued interval follow-up is recommended with repeat exam in 6-12 months. 2. Additional small, solid pulmonary nodules are stable. 3. Two subtle ground-glass attenuating nodules in the right lower lobe and lingula are unchanged. Attention in these areas on follow-up imaging advised. 4. Emphysema and aortic atherosclerosis.  Aortic Atherosclerosis (ICD10-I70.0) and Emphysema (ICD10-J43.9).   Electronically Signed   By: Kerby Moors M.D.   On: 08/21/2020 13:42 I personally reviewed the CT images.  There has been no interval change since the scan in April.  She has multiple nodules and groundglass opacities as well as some calcified granulomas.  Impression: [name] is a 75 year old woman with a past history of tobacco abuse (quit 2011), COPD, lung nodules, CAD, coronary bypass grafting in 2012, hypertension, and gastroesophageal  reflux.  Tobacco abuse-quit 2011  Lung nodules-stable from April 2021.  We will plan to scan again in 6 months with particular attention to the 2 nodules in close proximity in the right upper lobe.  CAD-she does have occasional chest pain.  She also mentioned that her last visit.  She did see Dr. Stanford Breed in October.  Her pain is atypical but I did advise her that if she were to notice increasing in frequency or severity she should follow up with Dr. Stanford Breed.  Plan: Return in 6 months with CT chest  Melrose Nakayama, MD [facility] [phone]

## 2020-08-27 ENCOUNTER — Telehealth: Payer: Self-pay | Admitting: Orthopedic Surgery

## 2020-08-27 NOTE — Telephone Encounter (Signed)
Patient called asked if Dr Marlou Sa will call her with the results of her vascular test that was done 2 weeks ago? Patient said her right leg is still hurting. The number to contact patient is [phone]

## 2020-08-27 NOTE — Telephone Encounter (Signed)
Please see below.

## 2020-08-28 NOTE — Telephone Encounter (Signed)
Where were they done I dont see any encounters for that

## 2020-08-29 NOTE — Telephone Encounter (Signed)
I called and left message on machine for [facility]

## 2020-08-29 NOTE — Telephone Encounter (Signed)
They are there under chart review and CV procedures

## 2020-09-10 ENCOUNTER — Other Ambulatory Visit: Payer: Self-pay

## 2020-09-10 ENCOUNTER — Ambulatory Visit
Admission: RE | Admit: 2020-09-10 | Discharge: 2020-09-10 | Disposition: A | Discharge status: Home / Self Care | Payer: Medicare HMO | Source: Ambulatory Visit | Attending: General Practice | Admitting: General Practice

## 2020-09-10 ENCOUNTER — Other Ambulatory Visit: Payer: Self-pay | Admitting: General Practice

## 2020-09-10 DIAGNOSIS — M542 Cervicalgia: Secondary | ICD-10-CM

## 2020-09-10 DIAGNOSIS — M25511 Pain in right shoulder: Secondary | ICD-10-CM

## 2020-09-19 ENCOUNTER — Other Ambulatory Visit: Payer: Self-pay

## 2020-09-19 ENCOUNTER — Telehealth: Payer: Self-pay | Admitting: Cardiology

## 2020-09-19 ENCOUNTER — Ambulatory Visit (INDEPENDENT_AMBULATORY_CARE_PROVIDER_SITE_OTHER): Payer: Medicare HMO | Admitting: Vascular Surgery

## 2020-09-19 ENCOUNTER — Encounter (HOSPITAL_COMMUNITY): Payer: Medicare Other

## 2020-09-19 ENCOUNTER — Other Ambulatory Visit: Payer: Self-pay | Admitting: General Practice

## 2020-09-19 ENCOUNTER — Encounter: Payer: Self-pay | Admitting: Vascular Surgery

## 2020-09-19 VITALS — BP 125/78 | HR 77 | Temp 97.7°F | Ht 63.0 in | Wt 133.2 lb

## 2020-09-19 DIAGNOSIS — I739 Peripheral vascular disease, unspecified: Secondary | ICD-10-CM

## 2020-09-19 DIAGNOSIS — M79604 Pain in right leg: Secondary | ICD-10-CM

## 2020-09-19 DIAGNOSIS — R29898 Other symptoms and signs involving the musculoskeletal system: Secondary | ICD-10-CM

## 2020-09-19 DIAGNOSIS — M545 Low back pain, unspecified: Secondary | ICD-10-CM

## 2020-09-19 NOTE — Telephone Encounter (Signed)
I called and spoke with the patient. She advised she was currently at the office for her Vascular doctor.  She asked that we call back around 2:00 pm.  I advised we call back at a later time.

## 2020-09-19 NOTE — Progress Notes (Signed)
Patient ID: [name], female   DOB: 03-Apr-1946, 75 y.o.   MRN: [id number]  Reason for Consult: New Patient (Initial Visit)   Referred by [name], FNP  Subjective:     HPI:  [name] is a 75 y.o. female with history of coronary artery disease status post CABG.  She has bilateral lower extremity pain and occasional swelling.  She states that she does have cramping of her calves with walking these are nonspecific distances and not necessarily reproducible.  She does have cramping in the middle the night as well.  She has no tissue loss or ulceration.  She has no personal or family history of aneurysmal disease.  She does not have a previous history of stroke.  Patient would not want any procedures unless absolutely necessary.  Past Medical History:  Diagnosis Date  . Anemia   . Anxiety    hx  . Arthritis    "my whole body"  . Atrial septal aneurysm   . COLONIC POLYPS, HX OF   . COPD (chronic obstructive pulmonary disease) ([place])    no per pt on 03/21/2014 & 02/06/2018  . Coronary artery disease    a. s/p CABG 2012.  Marland Kitchen DISC DISEASE, CERVICAL   . Gastroesophageal reflux disease   . Headache    "I have headaches all the time" (02/06/2018)  . Heart murmur    as a child  . History of rheumatic fever   . Hyperlipidemia   . Hypertension   . Hypothyroidism   . Insomnia   . Osteoarthritis   . OSTEOPENIA   . PAD (peripheral artery disease) ([place])    a. PTA to R SFA 2015. b. PTA to L SFA 05/2016.  Marland Kitchen Plantar fasciitis of left foot 10/02/2019  . Pre-diabetes   . RSD (reflex sympathetic dystrophy) 10/31/2011  . Vitamin D deficiency    Family History  Problem Relation Age of Onset  . Arthritis Maternal Aunt   . Heart disease Maternal Aunt   . Hypertension Maternal Aunt   . Diabetes Maternal Aunt   . Colon cancer Neg Hx   . Kidney disease Neg Hx   . Liver disease Neg Hx   . Throat cancer Neg Hx   . Stomach cancer Neg Hx   . Colon polyps Neg Hx    Past  Surgical History:  Procedure Laterality Date  . ABDOMINAL HYSTERECTOMY  1977   "partial"  . BALLOON ANGIOPLASTY, ARTERY Right 03/21/2014   SFA  . CARDIAC CATHETERIZATION  2011   . CARPAL TUNNEL RELEASE Right 2005  . CATARACT EXTRACTION Left 2013  . CORONARY ARTERY BYPASS GRAFT  10/2009   LIMA to the LAD, saphenous vein graft to the acute marginal, saphenous vein graft to the diagonal and obtuse marginal.  . DILATION AND CURETTAGE OF UTERUS  1978  . JOINT REPLACEMENT    . LOWER EXTREMITY ANGIOGRAM N/A 03/21/2014   Procedure: LOWER EXTREMITY ANGIOGRAM;  Surgeon: Lorretta Harp, MD;  [facility] CATH LAB;  Service: Cardiovascular;  Laterality: N/A;  . LOWER EXTREMITY ANGIOGRAM  06/07/2016   Abdominal aortogram/bilateral iliac angiogram/bifemoral runoff  . LOWER EXTREMITY ANGIOGRAPHY N/A 02/06/2018   Procedure: LOWER EXTREMITY ANGIOGRAPHY;  Surgeon: Lorretta Harp, MD;  Location: [place] CV LAB;  Service: Cardiovascular;  Laterality: N/A;  . PERIPHERAL VASCULAR ATHERECTOMY  02/06/2018   Procedure: PERIPHERAL VASCULAR ATHERECTOMY;  Surgeon: Lorretta Harp, MD;  Location: [place] CV LAB;  Service: Cardiovascular;;  .  PERIPHERAL VASCULAR CATHETERIZATION Bilateral 06/07/2016   Procedure: Lower Extremity Angiography;  Surgeon: Lorretta Harp, MD;  Location: [facility] CV LAB;  Service: Cardiovascular;  Laterality: Bilateral;  . PERIPHERAL VASCULAR CATHETERIZATION Left 06/07/2016   Procedure: Peripheral Vascular Atherectomy;  Surgeon: Lorretta Harp, MD;  Location: [place] CV LAB;  Service: Cardiovascular;  Laterality: Left;  SFA  . PERIPHERAL VASCULAR CATHETERIZATION Left 06/07/2016   Procedure: Peripheral Vascular Balloon Angioplasty;  Surgeon: Lorretta Harp, MD;  Location: [facility] CV LAB;  Service: Cardiovascular;  Laterality: Left;  SFA  . THYROIDECTOMY  1995  . TONSILLECTOMY    . TOTAL KNEE ARTHROPLASTY Right 06/18/2014   Procedure: RIGHT TOTAL KNEE ARTHROPLASTY;   Surgeon: Tobi Bastos, MD;  Location: [facility] ORS;  Service: Orthopedics;  Laterality: Right;    Short Social History:  Social History   Tobacco Use  . Smoking status: Former Smoker    Packs/day: 0.80    Years: 45.00    Pack years: 36.00    Types: Cigarettes    Quit date: 08/23/2009    Years since quitting: 11.0  . Smokeless tobacco: Never Used  Substance Use Topics  . Alcohol use: Yes    Alcohol/week: 2.0 standard drinks    Types: 2 Glasses of wine per week    Allergies  Allergen Reactions  . Atorvastatin Other (See Comments)    Muscle aches Other reaction(s): Other (See Comments) Muscle aches    Current Outpatient Medications  Medication Sig Dispense Refill  . amLODipine (NORVASC) 10 MG tablet TAKE ONE TABLET BY MOUTH ONCE DAILY 90 tablet 1  . aspirin EC 81 MG tablet Take 81 mg by mouth daily.    . Calcium Carb-Cholecalciferol (CALCIUM 1000 + D PO) Take 1 tablet by mouth every other day.    Marland Kitchen co-enzyme Q-10 30 MG capsule Take 30 mg by mouth as needed.     . hydrOXYzine (ATARAX/VISTARIL) 25 MG tablet Take 25 mg by mouth 3 (three) times daily.     Marland Kitchen levothyroxine (SYNTHROID, LEVOTHROID) 150 MCG tablet Take 150 mcg by mouth daily before breakfast.    . Multiple Vitamin (MULTIVITAMIN WITH MINERALS) TABS tablet Take 1 tablet by mouth every morning. Centrum    . Vitamins/Minerals TABS Take by mouth.    Marland Kitchen albuterol (VENTOLIN HFA) 108 (90 Base) MCG/ACT inhaler INHALE 1 PUFF BY MOUTH EVERY 6 HOURS AS NEEDED FOR SHORTNESS OF BREATH AND FOR WHEEZING    . alclomethasone (ACLOVATE) 0.05 % cream Apply 1 application topically as needed.     . clopidogrel (PLAVIX) 75 MG tablet Take 1 tablet (75 mg total) by mouth daily with breakfast. 90 tablet 3  . CONSTULOSE 10 GM/15ML solution TAKE 30 ML BY MOUTH TWICE DAILY AS NEEDED    . cyclobenzaprine (FLEXERIL) 5 MG tablet TAKE 1 TABLET BY MOUTH TWICE DAILY AS NEEDED FOR MUSCLE SPASM    . ezetimibe (ZETIA) 10 MG tablet Take 10 mg by mouth daily.     . Lancets (ONETOUCH DELICA PLUS [id number]) MISC 1 each by Other route 2 (two) times daily.    Marland Kitchen LINZESS 290 MCG CAPS capsule Take 290 mcg by mouth daily.    Marland Kitchen nystatin cream (MYCOSTATIN) Apply topically.    Glory Rosebush VERIO test strip 1 each by Other route 2 (two) times daily.    . pantoprazole (PROTONIX) 40 MG tablet Take 1 tablet (40 mg total) by mouth daily. 30 tablet 6  . Propylene Glycol 0.6 % SOLN Place 1 drop  into both eyes every morning.    . rosuvastatin (CRESTOR) 40 MG tablet Take 1 tablet (40 mg total) by mouth daily. 90 tablet 3  . triamcinolone ointment (KENALOG) 0.1 %     . Vitamin D, Ergocalciferol, (DRISDOL) 1.25 MG (50000 UNIT) CAPS capsule Take 50,000 Units by mouth once a week.     Current Facility-Administered Medications  Medication Dose Route Frequency Provider Last Rate Last Admin  . 0.9 %  sodium chloride infusion  500 mL Intravenous Once Milus Banister, MD       Facility-Administered Medications Ordered in Other Visits  Medication Dose Route Frequency Provider Last Rate Last Admin  . tranexamic acid (CYKLOKAPRON) 2,000 mg in sodium chloride 0.9 % 50 mL Topical Application  4,401 mg Topical Once Cecilio Asper, [employer], PA-C        Review of Systems  Constitutional:  Constitutional negative. HENT: HENT negative.  Eyes: Eyes negative.  Cardiovascular: Positive for claudication.  GI: Gastrointestinal negative.  Musculoskeletal: Positive for leg pain.  Neurological: Neurological negative. Hematologic: Hematologic/lymphatic negative.  Psychiatric: Psychiatric negative.        Objective:  Objective   Vitals:   09/19/20 1133  BP: 125/78  Pulse: 77  Temp: 97.7 F (36.5 C)  TempSrc: Skin  SpO2: 96%  Weight: 133 lb 3.2 oz (60.4 kg)  Height: 5\' 3"  (1.6 m)   Body mass index is 23.6 kg/m.  Physical Exam HENT:     Head: Normocephalic.     Nose:     Comments: Wearing a mask Eyes:     Pupils: Pupils are equal, round, and reactive to light.  Neck:      Vascular: No carotid bruit.  Cardiovascular:     Pulses:          Carotid pulses are 2+ on the right side and 2+ on the left side.      Femoral pulses are 2+ on the right side and 2+ on the left side.      Popliteal pulses are 0 on the right side and 0 on the left side.  Pulmonary:     Effort: Pulmonary effort is normal.  Abdominal:     General: Abdomen is flat.     Palpations: Abdomen is soft. There is no mass.  Musculoskeletal:        General: No swelling. Normal range of motion.  Skin:    Capillary Refill: Capillary refill takes less than 2 seconds.  Neurological:     General: No focal deficit present.     Mental Status: She is alert.  Psychiatric:        Mood and Affect: Mood normal.        Behavior: Behavior normal.        Thought Content: Thought content normal.        Judgment: Judgment normal.     Data: ABI Findings:  +---------+------------------+-----+----------+--------+  Right  Rt Pressure (mmHg)IndexWaveform Comment   +---------+------------------+-----+----------+--------+  Brachial 162                      +---------+------------------+-----+----------+--------+  ATA   140        0.86            +---------+------------------+-----+----------+--------+  PTA   0         0.00 absent        +---------+------------------+-----+----------+--------+  PERO   136        0.84 monophasic      +---------+------------------+-----+----------+--------+  DP  biphasic       +---------+------------------+-----+----------+--------+  Great Toe94        0.58            +---------+------------------+-----+----------+--------+   +---------+------------------+-----+----------+-------+  Left   Lt Pressure (mmHg)IndexWaveform Comment  +---------+------------------+-----+----------+-------+  Brachial 157                       +---------+------------------+-----+----------+-------+  ATA   125        0.77            +---------+------------------+-----+----------+-------+  PTA   0         0.00 absent        +---------+------------------+-----+----------+-------+  PERO   124        0.77 monophasic      +---------+------------------+-----+----------+-------+  DP                monophasic      +---------+------------------+-----+----------+-------+  Great Toe60        0.37            +---------+------------------+-----+----------+-------+   +-------+-----------+-----------+------------+------------+  ABI/TBIToday's ABIToday's TBIPrevious ABIPrevious TBI  +-------+-----------+-----------+------------+------------+  Right 0.86    0.58    0.88    0.92      +-------+-----------+-----------+------------+------------+  Left  0.77    0.37    0.87    0.55      +-------+-----------+-----------+------------+------------+     Right ABIs appear essentially unchanged compared to prior study on  11/15/2019. Left ABIs appear decreased compared to prior study on  11/15/2019.    Summary:  Right: Resting right ankle-brachial index indicates mild right lower  extremity arterial disease. The right toe-brachial index is abnormal. RT  great toe pressure = 94 mmHg.   Left: Resting left ankle-brachial index indicates moderate left lower  extremity arterial disease.      Assessment/Plan:     75 year old female with bilateral right greater than left lower extremity pain with what appeared to be atypical claudication symptoms with cramping of her calf occasionally long distance occasionally short distance walking.  She also has cramping in the middle of the night.  Her ABIs suggest mild disease on the right with more moderate disease on the  left.  I cannot reliably feel popliteal pulses bilaterally suggesting SFA disease.  I discussed with her that given that she does not have rest pain or tissue loss she is at very low risk of having amputation or progressing to critical limb ischemia.  Patient would not want procedure less absolutely necessary.  I will have her follow-up in 1 year with repeat ABIs unless she has worsening issues and we can see her sooner.     Waynetta Sandy MD [facility]

## 2020-09-19 NOTE — Telephone Encounter (Signed)
Pt c/o Shortness Of Breath: STAT if SOB developed within the last 24 hours or pt is noticeably SOB on the phone  1. Are you currently SOB (can you hear that pt is SOB on the phone)? No   2. How long have you been experiencing SOB? About 2 weeks, per patient  3. Are you SOB when sitting or when up moving around? Both   4. Are you currently experiencing any other symptoms? Chest pain, pain on side right of neck and shoulder  Pt c/o of Chest Pain: STAT if CP now or developed within 24 hours  1. Are you having CP right now? No   2. Are you experiencing any other symptoms (ex. SOB, nausea, vomiting, sweating)? SOB  3. How long have you been experiencing CP? Over 2 weeks, per patient   4. Is your CP continuous or coming and going? Coming and going   5. Have you taken Nitroglycerin? No  ?

## 2020-09-19 NOTE — Telephone Encounter (Signed)
Spoke with patient. She has had some shortness of breath off and on for the last two weeks. She has also had chest pain that comes and goes. IT is located on the right side of her body. It goes down the right side fo her neck and into her right arm. She reports she saw PCP about this who said it was arthritis and to take Tylenol Arthritis BID. Patient reports she also thinks it is her nerves working on her because she is losing a lot of friends lately to heart attacks. Pain usually comes at night when she's asleep and she wakes up having to take deep breaths and burp. The shortness of breath comes on with activity. Not short of breath while on the phone. Patient saw vascular and vein today. She mentioned the chest pain and shortness of breath but reports MD was more focused on her legs. No symptoms at this time.   Next available in office visit is 2/16. Appointment made with Denyse Amass at that time. Message sent to MD for review.

## 2020-10-03 ENCOUNTER — Other Ambulatory Visit: Payer: Self-pay

## 2020-10-03 ENCOUNTER — Ambulatory Visit
Admission: RE | Admit: 2020-10-03 | Discharge: 2020-10-03 | Disposition: A | Discharge status: Home / Self Care | Payer: Medicare HMO | Source: Ambulatory Visit | Attending: General Practice | Admitting: General Practice

## 2020-10-03 ENCOUNTER — Encounter: Payer: Self-pay | Admitting: Physical Medicine and Rehabilitation

## 2020-10-03 DIAGNOSIS — R29898 Other symptoms and signs involving the musculoskeletal system: Secondary | ICD-10-CM

## 2020-10-03 DIAGNOSIS — M545 Low back pain, unspecified: Secondary | ICD-10-CM

## 2020-10-03 DIAGNOSIS — M79604 Pain in right leg: Secondary | ICD-10-CM

## 2020-10-07 ENCOUNTER — Encounter: Payer: Self-pay | Admitting: *Deleted

## 2020-10-08 ENCOUNTER — Ambulatory Visit: Payer: Medicare HMO | Admitting: General Practice

## 2020-10-14 NOTE — Progress Notes (Signed)
HPI: FU coronary artery disease.S/P CABGMarch 2012 with a LIMA to the LAD, a sequential saphenous vein graft to the diagonal and obtuse marginal and a saphenous vein graft to the acute marginal. Fu echo in August of 2012 showed an EF of 50-55, atrial septal aneurysm.Last nuclear study April 2019 showed ejection fraction 68% and normal perfusion.Patient followed by Dr. Gwenlyn Found for peripheral vascular disease. Multiple interventions previously. Had atherectomy of distal right SFA June 2019.Carotid Dopplers July 2019 showed 1 to 39% bilateral stenosis.ABIs December 2021 showed mild Lower extremity arterial disease and moderate left extremity arterial disease.  Chest CT December 2021 showed enlarging nodule in the posterior right upper lobe and follow-up recommended in 6 to 12 months.  Since last seen,she notes occasional chest discomfort predominantly in the epigastric area at night.  She sits up with mild improvement and begins belching which seems to help.  She has dyspnea on exertion but no orthopnea, PND, pedal edema or syncope.  Current Outpatient Medications  Medication Sig Dispense Refill  . albuterol (VENTOLIN HFA) 108 (90 Base) MCG/ACT inhaler INHALE 1 PUFF BY MOUTH EVERY 6 HOURS AS NEEDED FOR SHORTNESS OF BREATH AND FOR WHEEZING    . alclomethasone (ACLOVATE) 0.05 % cream Apply 1 application topically as needed.     Marland Kitchen amLODipine (NORVASC) 10 MG tablet TAKE ONE TABLET BY MOUTH ONCE DAILY 90 tablet 1  . aspirin EC 81 MG tablet Take 81 mg by mouth daily.    . Calcium Carb-Cholecalciferol (CALCIUM 1000 + D PO) Take 1 tablet by mouth every other day.    . clopidogrel (PLAVIX) 75 MG tablet Take 1 tablet (75 mg total) by mouth daily with breakfast. 90 tablet 3  . co-enzyme Q-10 30 MG capsule Take 30 mg by mouth as needed.     . CONSTULOSE 10 GM/15ML solution TAKE 30 ML BY MOUTH TWICE DAILY AS NEEDED    . cyclobenzaprine (FLEXERIL) 5 MG tablet TAKE 1 TABLET BY MOUTH TWICE DAILY AS NEEDED  FOR MUSCLE SPASM    . ezetimibe (ZETIA) 10 MG tablet Take 10 mg by mouth daily.    Marland Kitchen gabapentin (NEURONTIN) 100 MG capsule Take by mouth.    . hydrOXYzine (ATARAX/VISTARIL) 25 MG tablet Take 25 mg by mouth 3 (three) times daily.     . Lancets (ONETOUCH DELICA PLUS [id number]) MISC 1 each by Other route 2 (two) times daily.    Marland Kitchen levothyroxine (SYNTHROID, LEVOTHROID) 150 MCG tablet Take 150 mcg by mouth daily before breakfast.    . LINZESS 290 MCG CAPS capsule Take 290 mcg by mouth daily.    . Multiple Vitamin (MULTIVITAMIN WITH MINERALS) TABS tablet Take 1 tablet by mouth every morning. Centrum    . nystatin cream (MYCOSTATIN) Apply topically.    Glory Rosebush VERIO test strip 1 each by Other route 2 (two) times daily.    . pantoprazole (PROTONIX) 40 MG tablet Take 1 tablet (40 mg total) by mouth daily. 30 tablet 6  . Propylene Glycol 0.6 % SOLN Place 1 drop into both eyes every morning.    . rosuvastatin (CRESTOR) 40 MG tablet Take 1 tablet (40 mg total) by mouth daily. 90 tablet 3  . triamcinolone ointment (KENALOG) 0.1 %     . Vitamin D, Ergocalciferol, (DRISDOL) 1.25 MG (50000 UNIT) CAPS capsule Take 50,000 Units by mouth once a week.    . Vitamins/Minerals TABS Take by mouth.     Current Facility-Administered Medications  Medication Dose Route Frequency  Provider Last Rate Last Admin  . 0.9 %  sodium chloride infusion  500 mL Intravenous Once Milus Banister, MD       Facility-Administered Medications Ordered in Other Visits  Medication Dose Route Frequency Provider Last Rate Last Admin  . tranexamic acid (CYKLOKAPRON) 2,000 mg in sodium chloride 0.9 % 50 mL Topical Application  6,160 mg Topical Once Ardeen Jourdain, PA-C         Past Medical History:  Diagnosis Date  . Anemia   . Anxiety    hx  . Arthritis    "my whole body"  . Atrial septal aneurysm   . COLONIC POLYPS, HX OF   . COPD (chronic obstructive pulmonary disease) ([place])    no per pt on 03/21/2014 & 02/06/2018  .  Coronary artery disease    a. s/p CABG 2012.  Marland Kitchen DISC DISEASE, CERVICAL   . Gastroesophageal reflux disease   . Headache    "I have headaches all the time" (02/06/2018)  . Heart murmur    as a child  . History of rheumatic fever   . Hyperlipidemia   . Hypertension   . Hypothyroidism   . Insomnia   . Osteoarthritis   . OSTEOPENIA   . PAD (peripheral artery disease) ([place])    a. PTA to R SFA 2015. b. PTA to L SFA 05/2016.  Marland Kitchen Plantar fasciitis of left foot 10/02/2019  . Pre-diabetes   . RSD (reflex sympathetic dystrophy) 10/31/2011  . Vitamin D deficiency     Past Surgical History:  Procedure Laterality Date  . ABDOMINAL HYSTERECTOMY  1977   "partial"  . BALLOON ANGIOPLASTY, ARTERY Right 03/21/2014   SFA  . CARDIAC CATHETERIZATION  2011   . CARPAL TUNNEL RELEASE Right 2005  . CATARACT EXTRACTION Left 2013  . CORONARY ARTERY BYPASS GRAFT  10/2009   LIMA to the LAD, saphenous vein graft to the acute marginal, saphenous vein graft to the diagonal and obtuse marginal.  . DILATION AND CURETTAGE OF UTERUS  1978  . JOINT REPLACEMENT    . LOWER EXTREMITY ANGIOGRAM N/A 03/21/2014   Procedure: LOWER EXTREMITY ANGIOGRAM;  Surgeon: Lorretta Harp, MD;  Location: [facility] CATH LAB;  Service: Cardiovascular;  Laterality: N/A;  . LOWER EXTREMITY ANGIOGRAM  06/07/2016   Abdominal aortogram/bilateral iliac angiogram/bifemoral runoff  . LOWER EXTREMITY ANGIOGRAPHY N/A 02/06/2018   Procedure: LOWER EXTREMITY ANGIOGRAPHY;  Surgeon: Lorretta Harp, MD;  Location: [place] CV LAB;  Service: Cardiovascular;  Laterality: N/A;  . PERIPHERAL VASCULAR ATHERECTOMY  02/06/2018   Procedure: PERIPHERAL VASCULAR ATHERECTOMY;  Surgeon: Lorretta Harp, MD;  Location: [place] CV LAB;  Service: Cardiovascular;;  . PERIPHERAL VASCULAR CATHETERIZATION Bilateral 06/07/2016   Procedure: Lower Extremity Angiography;  Surgeon: Lorretta Harp, MD;  Location: [place] CV LAB;  Service: Cardiovascular;  Laterality:  Bilateral;  . PERIPHERAL VASCULAR CATHETERIZATION Left 06/07/2016   Procedure: Peripheral Vascular Atherectomy;  Surgeon: Lorretta Harp, MD;  Location: [place] CV LAB;  Service: Cardiovascular;  Laterality: Left;  SFA  . PERIPHERAL VASCULAR CATHETERIZATION Left 06/07/2016   Procedure: Peripheral Vascular Balloon Angioplasty;  Surgeon: Lorretta Harp, MD;  Location: [place] CV LAB;  Service: Cardiovascular;  Laterality: Left;  SFA  . THYROIDECTOMY  1995  . TONSILLECTOMY    . TOTAL KNEE ARTHROPLASTY Right 06/18/2014   Procedure: RIGHT TOTAL KNEE ARTHROPLASTY;  Surgeon: Tobi Bastos, MD;  Location: [facility] ORS;  Service: Orthopedics;  Laterality: Right;    Social History  Socioeconomic History  . Marital status: Divorced    Spouse name: Not on file  . Number of children: 2  . Years of education: 84  . Highest education level: Not on file  Occupational History  . Occupation: Retired    Fish farm manager: RETIRED  Tobacco Use  . Smoking status: Former Smoker    Packs/day: 0.80    Years: 45.00    Pack years: 36.00    Types: Cigarettes    Quit date: 08/23/2009    Years since quitting: 11.1  . Smokeless tobacco: Never Used  Vaping Use  . Vaping Use: Never used  Substance and Sexual Activity  . Alcohol use: Yes    Alcohol/week: 2.0 standard drinks    Types: 2 Glasses of wine per week  . Drug use: Never  . Sexual activity: Not Currently  Other Topics Concern  . Not on file  Social History Narrative  . Not on file   Social Determinants of Health   Financial Resource Strain: Not on file  Food Insecurity: Not on file  Transportation Needs: Not on file  Physical Activity: Not on file  Stress: Not on file  Social Connections: Not on file  Intimate Partner Violence: Not on file    Family History  Problem Relation Age of Onset  . Arthritis Maternal Aunt   . Heart disease Maternal Aunt   . Hypertension Maternal Aunt   . Diabetes Maternal Aunt   . Colon cancer Neg Hx   .  Kidney disease Neg Hx   . Liver disease Neg Hx   . Throat cancer Neg Hx   . Stomach cancer Neg Hx   . Colon polyps Neg Hx     ROS: no fevers or chills, productive cough, hemoptysis, dysphasia, odynophagia, melena, hematochezia, dysuria, hematuria, rash, seizure activity, orthopnea, PND, pedal edema, claudication. Remaining systems are negative.  Physical Exam: Well-developed well-nourished in no acute distress.  Skin is warm and dry.  HEENT is normal.  Neck is supple.  Chest is clear to auscultation with normal expansion.  Cardiovascular exam is regular rate and rhythm.  Abdominal exam nontender or distended. No masses palpated. Extremities show no edema. neuro grossly intact  ECG-normal sinus rhythm at a rate of 78, cannot rule out prior septal infarct.  Personally reviewed  A/P  1 coronary artery disease status post coronary artery bypass graft-Continue aspirin and statin.  She has occasional epigastric pain that may be GI in etiology.  I will arrange a University nuclear study for risk stratification.  2 hypertension-blood pressure controlled.  Continue present medical regimen and follow.  3 hyperlipidemia-present medical regimen.  Check lipids and liver.  4 peripheral vascular disease-followed by Dr. Gwenlyn Found.  No claudication at present.  5 Carotid artery disease-mild on most recent.  6 lung nodule-followed by Dr. Roxan Hockey.  Kirk Ruths, MD

## 2020-10-17 ENCOUNTER — Encounter: Payer: Self-pay | Admitting: Cardiology

## 2020-10-17 ENCOUNTER — Ambulatory Visit: Payer: Medicare HMO | Admitting: Cardiology

## 2020-10-17 ENCOUNTER — Other Ambulatory Visit: Payer: Self-pay

## 2020-10-17 VITALS — BP 118/62 | HR 78 | Ht 63.0 in | Wt 133.2 lb

## 2020-10-17 DIAGNOSIS — I2510 Atherosclerotic heart disease of native coronary artery without angina pectoris: Secondary | ICD-10-CM

## 2020-10-17 DIAGNOSIS — R072 Precordial pain: Secondary | ICD-10-CM

## 2020-10-17 DIAGNOSIS — E7800 Pure hypercholesterolemia, unspecified: Secondary | ICD-10-CM

## 2020-10-17 DIAGNOSIS — I10 Essential (primary) hypertension: Secondary | ICD-10-CM

## 2020-10-17 NOTE — Patient Instructions (Signed)
  Testing/Procedures:  Your physician has requested that you have a lexiscan myoview. For further information please visit [email or web address]. Please follow instruction sheet, as given.[address]     Follow-Up: At [facility], you and your health needs are our priority.  As part of our continuing mission to provide you with exceptional heart care, we have created designated Provider Care Teams.  These Care Teams include your primary Cardiologist (physician) and Advanced Practice Providers (APPs -  Physician Assistants and Nurse Practitioners) who all work together to provide you with the care you need, when you need it.  We recommend signing up for the patient portal called "MyChart".  Sign up information is provided on this After Visit Summary.  MyChart is used to connect with patients for Virtual Visits (Telemedicine).  Patients are able to view lab/test results, encounter notes, upcoming appointments, etc.  Non-urgent messages can be sent to your provider as well.   To learn more about what you can do with MyChart, go to [email or web address].    Your next appointment:   6 month(s)  The format for your next appointment:   In Person  Provider:   Brian Crenshaw, MD   

## 2020-10-21 ENCOUNTER — Other Ambulatory Visit: Payer: Self-pay | Admitting: *Deleted

## 2020-10-21 DIAGNOSIS — R072 Precordial pain: Secondary | ICD-10-CM

## 2020-10-21 NOTE — Progress Notes (Signed)
ard

## 2020-10-22 ENCOUNTER — Encounter: Payer: Medicare HMO | Admitting: Physical Medicine and Rehabilitation

## 2020-10-23 ENCOUNTER — Telehealth (HOSPITAL_COMMUNITY): Payer: Self-pay | Admitting: *Deleted

## 2020-10-23 NOTE — Telephone Encounter (Signed)
Patient given detailed instructions per Myocardial Perfusion Study Information Sheet for the test on 10/29/20. Patient notified to arrive 15 minutes early and that it is imperative to arrive on time for appointment to keep from having the test rescheduled.  If you need to cancel or reschedule your appointment, please call the office within 24 hours of your appointment. . Patient verbalized understanding. Kirstie Peri

## 2020-10-25 LAB — LIPID PANEL
Chol/HDL Ratio: 3.8 ratio (ref 0.0–4.4)
Cholesterol, Total: 192 mg/dL (ref 100–199)
HDL: 50 mg/dL (ref >39)
LDL Chol Calc (NIH): 121 mg/dL — ABNORMAL HIGH (ref 0–99)
Triglycerides: 119 mg/dL (ref 0–149)
VLDL Cholesterol Cal: 21 mg/dL (ref 5–40)

## 2020-10-25 LAB — HEPATIC FUNCTION PANEL
ALT: 17 IU/L (ref 0–32)
AST: 15 IU/L (ref 0–40)
Albumin: 4.1 g/dL (ref 3.7–4.7)
Alkaline Phosphatase: 97 IU/L (ref 44–121)
Bilirubin Total: 0.5 mg/dL (ref 0.0–1.2)
Bilirubin, Direct: <0.1 mg/dL (ref 0.00–0.40)
Total Protein: 6.7 g/dL (ref 6.0–8.5)

## 2020-10-27 ENCOUNTER — Telehealth: Payer: Self-pay | Admitting: *Deleted

## 2020-10-27 DIAGNOSIS — Z79899 Other long term (current) drug therapy: Secondary | ICD-10-CM

## 2020-10-27 DIAGNOSIS — E7800 Pure hypercholesterolemia, unspecified: Secondary | ICD-10-CM

## 2020-10-27 NOTE — Telephone Encounter (Addendum)
Left message for pt to call    ----- Message from Lelon Perla, MD sent at 10/27/2020  7:16 AM EST ----- Make sure pt taking zetia and crestor; if not resume and check lipids and liver 12 weeks; if so refer to lipid clinic for repatha Kirk Ruths

## 2020-10-28 NOTE — Telephone Encounter (Signed)
Lab orders mailed to the pt.  

## 2020-10-28 NOTE — Telephone Encounter (Signed)
Spoke with pt to verify whether she is taking Crestor and Zetia. Pt voiced she will be honest and report she doesn't take both medication daily and only when she remember.  Pt encouraged to resume both medication daily and repeat lab work in 12 weeks as recommended by MD. Pt verbalized understanding.

## 2020-10-28 NOTE — Telephone Encounter (Signed)
Lm to call back ./cy 

## 2020-10-28 NOTE — Telephone Encounter (Signed)
Patient is returning call to discuss results.

## 2020-10-28 NOTE — Telephone Encounter (Signed)
Pt is returning call.  

## 2020-10-29 ENCOUNTER — Ambulatory Visit (HOSPITAL_COMMUNITY): Payer: Medicare Other | Attending: Cardiovascular Disease

## 2020-10-29 ENCOUNTER — Other Ambulatory Visit: Payer: Self-pay

## 2020-10-29 DIAGNOSIS — R072 Precordial pain: Secondary | ICD-10-CM | POA: Diagnosis not present

## 2020-10-29 LAB — MYOCARDIAL PERFUSION IMAGING
LV dias vol: 38 mL (ref 46–106)
LV sys vol: 12 mL
Peak HR: 101 {beats}/min
Rest HR: 72 {beats}/min
SDS: 3
SRS: 0
SSS: 3
TID: 1.14

## 2020-10-29 MED ORDER — TECHNETIUM TC 99M TETROFOSMIN IV KIT
10.2000 | PACK | Freq: Once | INTRAVENOUS | Status: AC | PRN
  Administered 2020-10-29: 10.2 via INTRAVENOUS
  Filled 2020-10-29: qty 11

## 2020-10-29 MED ORDER — TECHNETIUM TC 99M TETROFOSMIN IV KIT
31.5000 | PACK | Freq: Once | INTRAVENOUS | Status: AC | PRN
  Administered 2020-10-29: 31.5 via INTRAVENOUS
  Filled 2020-10-29: qty 32

## 2020-10-29 MED ORDER — REGADENOSON 0.4 MG/5ML IV SOLN
0.4000 mg | Freq: Once | INTRAVENOUS | Status: AC
  Administered 2020-10-29: 0.4 mg via INTRAVENOUS

## 2020-11-10 ENCOUNTER — Encounter: Payer: Medicare Other | Admitting: Physical Medicine and Rehabilitation

## 2020-11-14 ENCOUNTER — Other Ambulatory Visit (HOSPITAL_COMMUNITY): Payer: Self-pay | Admitting: Cardiovascular Disease

## 2020-11-14 DIAGNOSIS — Z9862 Peripheral vascular angioplasty status: Secondary | ICD-10-CM

## 2020-11-14 DIAGNOSIS — I739 Peripheral vascular disease, unspecified: Secondary | ICD-10-CM

## 2020-12-11 ENCOUNTER — Other Ambulatory Visit: Payer: Self-pay

## 2020-12-11 ENCOUNTER — Ambulatory Visit: Payer: Medicare Other | Admitting: Podiatry

## 2020-12-11 ENCOUNTER — Encounter: Payer: Self-pay | Admitting: Podiatry

## 2020-12-11 DIAGNOSIS — M79676 Pain in unspecified toe(s): Secondary | ICD-10-CM

## 2020-12-11 DIAGNOSIS — B351 Tinea unguium: Secondary | ICD-10-CM | POA: Diagnosis not present

## 2020-12-11 DIAGNOSIS — E1151 Type 2 diabetes mellitus with diabetic peripheral angiopathy without gangrene: Secondary | ICD-10-CM

## 2020-12-11 DIAGNOSIS — L853 Xerosis cutis: Secondary | ICD-10-CM | POA: Diagnosis not present

## 2020-12-11 NOTE — Progress Notes (Signed)
Subjective: [name] presents today at risk foot care. Pt has h/o NIDDM with PAD and callus(es) right hallux and painful mycotic toenails b/l that are difficult to trim. Pain interferes with ambulation. Aggravating factors include wearing enclosed shoe gear. Pain is relieved with periodic professional debridement.  Patient presents with secondary complaint of xerosis mild to moderate in nature to bilateral lower extremity.  She would like to know if there is anything that can be done for the dry skin.  She does not have any fissure to it.  She states she tries to moisturize but usually Vaseline.  She denies any other acute complaints.  She voices no new pedal problems on today's visit.  Alicia Side., FNP is patient's PCP.  Last visit was 10/08/2019.  Past Medical History:  Diagnosis Date  . Anemia   . Anxiety    hx  . Arthritis    "my whole body"  . Atrial septal aneurysm   . COLONIC POLYPS, HX OF   . COPD (chronic obstructive pulmonary disease) ([place])    no per pt on 03/21/2014 & 02/06/2018  . Coronary artery disease    a. s/p CABG 2012.  Marland Kitchen DISC DISEASE, CERVICAL   . Gastroesophageal reflux disease   . Headache    "I have headaches all the time" (02/06/2018)  . Heart murmur    as a child  . History of rheumatic fever   . Hyperlipidemia   . Hypertension   . Hypothyroidism   . Insomnia   . Osteoarthritis   . OSTEOPENIA   . PAD (peripheral artery disease) ([place])    a. PTA to R SFA 2015. b. PTA to L SFA 05/2016.  Marland Kitchen Plantar fasciitis of left foot 10/02/2019  . Pre-diabetes   . RSD (reflex sympathetic dystrophy) 10/31/2011  . Vitamin D deficiency      Current Outpatient Medications on File Prior to Visit  Medication Sig Dispense Refill  . escitalopram (LEXAPRO) 5 MG tablet one po am daily as directed    . meloxicam (MOBIC) 7.5 MG tablet TAKE 1 TABLET BY MOUTH ONCE DAILY FOR 30 DAYS    . Vitamin D, Cholecalciferol, 25 MCG (1000 UT) CAPS Take 1 capsule by mouth daily.    Marland Kitchen  albuterol (VENTOLIN HFA) 108 (90 Base) MCG/ACT inhaler INHALE 1 PUFF BY MOUTH EVERY 6 HOURS AS NEEDED FOR SHORTNESS OF BREATH AND FOR WHEEZING    . alclomethasone (ACLOVATE) 0.05 % cream Apply 1 application topically as needed.     Marland Kitchen amLODipine (NORVASC) 10 MG tablet TAKE ONE TABLET BY MOUTH ONCE DAILY 90 tablet 1  . aspirin EC 81 MG tablet Take 81 mg by mouth daily.    . Calcium Carb-Cholecalciferol (CALCIUM 1000 + D PO) Take 1 tablet by mouth every other day.    . clopidogrel (PLAVIX) 75 MG tablet Take 1 tablet (75 mg total) by mouth daily with breakfast. 90 tablet 3  . co-enzyme Q-10 30 MG capsule Take 30 mg by mouth as needed.     . CONSTULOSE 10 GM/15ML solution TAKE 30 ML BY MOUTH TWICE DAILY AS NEEDED    . cyclobenzaprine (FLEXERIL) 5 MG tablet TAKE 1 TABLET BY MOUTH TWICE DAILY AS NEEDED FOR MUSCLE SPASM    . ezetimibe (ZETIA) 10 MG tablet Take 10 mg by mouth daily.    . fluticasone (FLONASE) 50 MCG/ACT nasal spray SMARTSIG:1-2 Spray(s) Both Nares Daily PRN    . gabapentin (NEURONTIN) 100 MG capsule Take by mouth.    Marland Kitchen  hydrOXYzine (ATARAX/VISTARIL) 25 MG tablet Take 25 mg by mouth 3 (three) times daily.     . Lancets (ONETOUCH DELICA PLUS [id number]) MISC 1 each by Other route 2 (two) times daily.    Marland Kitchen levothyroxine (SYNTHROID, LEVOTHROID) 150 MCG tablet Take 150 mcg by mouth daily before breakfast.    . LINZESS 290 MCG CAPS capsule Take 290 mcg by mouth daily.    . meloxicam (MOBIC) 7.5 MG tablet Take 7.5 mg by mouth daily.    . Multiple Vitamin (MULTIVITAMIN WITH MINERALS) TABS tablet Take 1 tablet by mouth every morning. Centrum    . nystatin cream (MYCOSTATIN) Apply topically.    Marland Kitchen omeprazole (PRILOSEC) 40 MG capsule Take 1 capsule by mouth daily.    Glory Rosebush VERIO test strip 1 each by Other route 2 (two) times daily.    . pantoprazole (PROTONIX) 40 MG tablet Take 1 tablet (40 mg total) by mouth daily. 30 tablet 6  . Propylene Glycol 0.6 % SOLN Place 1 drop into both eyes every  morning.    . rosuvastatin (CRESTOR) 40 MG tablet Take 1 tablet (40 mg total) by mouth daily. 90 tablet 3  . triamcinolone ointment (KENALOG) 0.1 %     . Vitamin D, Ergocalciferol, (DRISDOL) 1.25 MG (50000 UNIT) CAPS capsule Take 50,000 Units by mouth once a week.    . Vitamins/Minerals TABS Take by mouth.     Current Facility-Administered Medications on File Prior to Visit  Medication Dose Route Frequency Provider Last Rate Last Admin  . 0.9 %  sodium chloride infusion  500 mL Intravenous Once Milus Banister, MD      . tranexamic acid (CYKLOKAPRON) 2,000 mg in sodium chloride 0.9 % 50 mL Topical Application  8,676 mg Topical Once Ardeen Jourdain, PA-C         Allergies  Allergen Reactions  . Atorvastatin Other (See Comments)    Muscle aches Other reaction(s): Other (See Comments) Muscle aches    Objective: [name] is a pleasant 75 y.o. African American female, WD, WN in NAD. AAO x 3.   No significant changes to physical examination on today's visit.  Vascular Examination: Capillary refill time to digits immediate b/l. Faintly palpable pedal pulses b/l. Pedal hair present b/l. Skin temperature gradient within normal limits b/l.  Dermatological Examination: Pedal skin with normal turgor, texture and tone bilaterally. No open wounds bilaterally. Toenails 1-5 b/l elongated, dystrophic, thickened, crumbly with subungual debris and tenderness to dorsal palpation. Hyperkeratotic lesion(s) R hallux.  No erythema, no edema, no drainage, no flocculence.  Mild to moderate dry skin without fissuring or ulceration noted to both lower extremity  Musculoskeletal: Normal muscle strength 5/5 to all lower extremity muscle groups bilaterally. No pain crepitus or joint limitation noted with ROM b/l. Hallux valgus with bunion deformity noted b/l.  Neurological Examination: Protective sensation intact 5/5 intact bilaterally with 10g monofilament b/l. Vibratory sensation intact  b/l.  Assessment: 1. Pain due to onychomycosis of toenail   2. Type II diabetes mellitus with peripheral circulatory disorder (HCC)   3. Xerosis cutis      Plan: -Examined patient. -No new findings. No new orders. -Continue diabetic foot care principles. Literature dispensed on today.  -Toenails 1-5 b/l were debrided in length and girth with sterile nail nippers and dremel without iatrogenic bleeding.  -Callus(es) R hallux pared utilizing sterile scalpel blade without complication or incident. Total number debrided =1. -Patient to continue soft, supportive shoe gear daily. -Patient to report any pedal  injuries to medical professional immediately. -Patient/POA to call should there be question/concern in the interim.   I explained to the patient the etiology of xerosis and various treatment options were extensively discussed.  I explained to the patient the importance of maintaining moisturization of the skin with application of over-the-counter lotion such as Eucerin or Luciderm.  I have asked the patient to apply this twice a day.  If unable to resolve patient will benefit from prescription lotion.   Return in about 3 months (around 03/12/2021) for Routine Foot Care.  Felipa Furnace, DPM

## 2020-12-26 ENCOUNTER — Ambulatory Visit: Payer: Medicare Other | Admitting: Podiatry

## 2021-01-21 LAB — LIPID PANEL
Chol/HDL Ratio: 3.9 ratio (ref 0.0–4.4)
Cholesterol, Total: 167 mg/dL (ref 100–199)
HDL: 43 mg/dL (ref >39)
LDL Chol Calc (NIH): 101 mg/dL — ABNORMAL HIGH (ref 0–99)
Triglycerides: 129 mg/dL (ref 0–149)
VLDL Cholesterol Cal: 23 mg/dL (ref 5–40)

## 2021-01-21 LAB — HEPATIC FUNCTION PANEL
ALT: 19 IU/L (ref 0–32)
AST: 17 IU/L (ref 0–40)
Albumin: 4.1 g/dL (ref 3.7–4.7)
Alkaline Phosphatase: 101 IU/L (ref 44–121)
Bilirubin Total: 0.5 mg/dL (ref 0.0–1.2)
Bilirubin, Direct: 0.11 mg/dL (ref 0.00–0.40)
Total Protein: 6.4 g/dL (ref 6.0–8.5)

## 2021-02-02 ENCOUNTER — Other Ambulatory Visit: Payer: Self-pay | Admitting: Thoracic Surgery (Cardiothoracic Vascular Surgery)

## 2021-02-02 DIAGNOSIS — R911 Solitary pulmonary nodule: Secondary | ICD-10-CM

## 2021-02-09 ENCOUNTER — Other Ambulatory Visit: Payer: Self-pay

## 2021-02-09 ENCOUNTER — Encounter
Payer: Medicare Other | Attending: Physical Medicine and Rehabilitation | Admitting: Physical Medicine and Rehabilitation

## 2021-02-09 ENCOUNTER — Encounter: Payer: Self-pay | Admitting: Physical Medicine and Rehabilitation

## 2021-02-09 VITALS — BP 136/75 | HR 78 | Temp 98.5°F | Ht 61.5 in | Wt 135.2 lb

## 2021-02-09 DIAGNOSIS — M5416 Radiculopathy, lumbar region: Secondary | ICD-10-CM

## 2021-02-09 DIAGNOSIS — R269 Unspecified abnormalities of gait and mobility: Secondary | ICD-10-CM | POA: Diagnosis present

## 2021-02-09 DIAGNOSIS — R252 Cramp and spasm: Secondary | ICD-10-CM | POA: Diagnosis not present

## 2021-02-09 DIAGNOSIS — M4712 Other spondylosis with myelopathy, cervical region: Secondary | ICD-10-CM | POA: Diagnosis not present

## 2021-02-09 DIAGNOSIS — G959 Disease of spinal cord, unspecified: Secondary | ICD-10-CM | POA: Insufficient documentation

## 2021-02-09 MED ORDER — GABAPENTIN 100 MG PO CAPS: 100.0000 mg | ORAL_CAPSULE | Freq: Three times a day (TID) | ORAL | 5 refills | Status: DC

## 2021-02-09 NOTE — Patient Instructions (Signed)
Pt is a 75 yr old female with hx of CAD, s/p CABG x4; HTN, HLD and  pre-DM, here for chronic pain of R neck, shoulder down to R low back and sciatica/radiculopathy  Here for evaluation of chronic pain.   Doesn't have pacemaker- need to get MRI of cervical spine - went over need for MRI since she shows signs of compression in her neck (might also have in lumbar spine, but neck is more important- )don't want her losing all strength and needing to stay in wheelchair to get around- since getting progressively weaker per pt.  2. Will wait on PT until gets MRI results- to see if needs surgery.  Will try Gabapentin again- tried- didn't try very well- and no side effects.  Gabapentin 100 mg in Am and 200 mg nightly- for nerve pain- HAVE TO TAKE DAILY to help.  Will scheduled trigger point injections at next f/u- to help calm muscle pain down.  Wait on tramadol or other opiates- doesn't like to take medicine.  F/U 6 weeks. To see how doing.

## 2021-02-09 NOTE — Progress Notes (Signed)
Subjective:    Patient ID: [name], female    DOB: 06/14/1946, 75 y.o.   MRN: [id number]  HPI  Pt is a 75 yr old female with hx of CAD, s/p CABG x4; HTN, HLD and  pre-DM, here for chronic pain of R neck, shoulder down to R low back and sciatica/radiculopathy  Here for evaluation of chronic pain. Doristine Church to [facility] Smith=- FNP for PCP.  Has been years- no trauma specifically.   More aching and throbbing From R shoulder down to hand/fingers.   Cannot hold anything in R hand- had carpal tunnel syndrome and some other surgery on R hand-  ~2005- since then, have a cold hand, and cannot hold much.   Also hurting in R low back that radiates down R leg to foot.   Hx of R TKR 5 years ago.- Dr Darnell Level- doesn't know why hurting. R knee "swollen"-  and keeps it elevated and uses hot/cold packs as well.   Can't remember if tramadol was helpful- got in 03/2019- doesn't like taking a lot of medicine.  Also shooting pain goes down R leg-  R shoulder and arm shooting down arm and tender/aching in R shoulder.  Uses a cream son got- helps a little- ease it up some.   Takes Tylenol 650mg - helps some- 2 pills- 2x/day max- at least 6 hours apart.     CT 2/22 of lumbar spine IMPRESSION: 1. Advanced lumbar disc and facet degeneration, most notable at L4-5 where there is grade 1 anterolisthesis and severe spinal and neural foraminal stenosis. 2. Moderate right neural foraminal stenosis at L1-2 and L2-3. 3. Aortic Atherosclerosis (ICD10-I70.0).  Also has some degeneration of AC and GH joint in R shoulder and advanced degeneration in C3/4 and C5/6.   Pain Inventory Average Pain 8 Pain Right Now 8 My pain is intermittent, stabbing, and aching  In the last 24 hours, has pain interfered with the following? General activity 8 Relation with others 8 Enjoyment of life 8 What TIME of day is your pain at its worst? daytime and night Sleep (in general) Poor  Pain is worse with: inactivity and  unsure Pain improves with: heat/ice and medication Relief from Meds: 3  use a cane how many minutes can you walk? 5-10 ability to climb steps?  yes do you drive?  yes  not employed: date last employed 2007  bladder control problems bowel control problems weakness numbness tingling trouble walking spasms dizziness  New pt  New pt    Family History  Problem Relation Age of Onset   Arthritis Maternal Aunt    Heart disease Maternal Aunt    Hypertension Maternal Aunt    Diabetes Maternal Aunt    Colon cancer Neg Hx    Kidney disease Neg Hx    Liver disease Neg Hx    Throat cancer Neg Hx    Stomach cancer Neg Hx    Colon polyps Neg Hx    Social History   Socioeconomic History   Marital status: Divorced    Spouse name: Not on file   Number of children: 2   Years of education: 14   Highest education level: Not on file  Occupational History   Occupation: Retired    Fish farm manager: RETIRED  Tobacco Use   Smoking status: Former    Packs/day: 0.80    Years: 45.00    Pack years: 36.00    Types: Cigarettes    Quit date: 08/23/2009  Years since quitting: 11.4   Smokeless tobacco: Never  Vaping Use   Vaping Use: Never used  Substance and Sexual Activity   Alcohol use: Yes    Alcohol/week: 2.0 standard drinks    Types: 2 Glasses of wine per week   Drug use: Never   Sexual activity: Not Currently  Other Topics Concern   Not on file  Social History Narrative   Not on file   Social Determinants of Health   Financial Resource Strain: Not on file  Food Insecurity: Not on file  Transportation Needs: Not on file  Physical Activity: Not on file  Stress: Not on file  Social Connections: Not on file   Past Surgical History:  Procedure Laterality Date   ABDOMINAL HYSTERECTOMY  1977   "partial"   BALLOON ANGIOPLASTY, ARTERY Right 03/21/2014   SFA   CARDIAC CATHETERIZATION  2011    Force Right 2005   CATARACT EXTRACTION Left 2013   CORONARY ARTERY  BYPASS GRAFT  10/2009   LIMA to the LAD, saphenous vein graft to the acute marginal, saphenous vein graft to the diagonal and obtuse marginal.   DILATION AND CURETTAGE OF UTERUS  1978   JOINT REPLACEMENT     LOWER EXTREMITY ANGIOGRAM N/A 03/21/2014   Procedure: LOWER EXTREMITY ANGIOGRAM;  Surgeon: Lorretta Harp, MD;  Location: [facility] CATH LAB;  Service: Cardiovascular;  Laterality: N/A;   LOWER EXTREMITY ANGIOGRAM  06/07/2016   Abdominal aortogram/bilateral iliac angiogram/bifemoral runoff   LOWER EXTREMITY ANGIOGRAPHY N/A 02/06/2018   Procedure: LOWER EXTREMITY ANGIOGRAPHY;  Surgeon: Lorretta Harp, MD;  Location: [place] CV LAB;  Service: Cardiovascular;  Laterality: N/A;   PERIPHERAL VASCULAR ATHERECTOMY  02/06/2018   Procedure: PERIPHERAL VASCULAR ATHERECTOMY;  Surgeon: Lorretta Harp, MD;  Location: [place] CV LAB;  Service: Cardiovascular;;   PERIPHERAL VASCULAR CATHETERIZATION Bilateral 06/07/2016   Procedure: Lower Extremity Angiography;  Surgeon: Lorretta Harp, MD;  Location: [place] CV LAB;  Service: Cardiovascular;  Laterality: Bilateral;   PERIPHERAL VASCULAR CATHETERIZATION Left 06/07/2016   Procedure: Peripheral Vascular Atherectomy;  Surgeon: Lorretta Harp, MD;  Location: [place] CV LAB;  Service: Cardiovascular;  Laterality: Left;  SFA   PERIPHERAL VASCULAR CATHETERIZATION Left 06/07/2016   Procedure: Peripheral Vascular Balloon Angioplasty;  Surgeon: Lorretta Harp, MD;  Location: [place] CV LAB;  Service: Cardiovascular;  Laterality: Left;  SFA   THYROIDECTOMY  1995   TONSILLECTOMY     TOTAL KNEE ARTHROPLASTY Right 06/18/2014   Procedure: RIGHT TOTAL KNEE ARTHROPLASTY;  Surgeon: Tobi Bastos, MD;  Location: [facility] ORS;  Service: Orthopedics;  Laterality: Right;   Past Medical History:  Diagnosis Date   Anemia    Anxiety    hx   Arthritis    "my whole body"   Atrial septal aneurysm    COLONIC POLYPS, HX OF    COPD (chronic obstructive  pulmonary disease) (HCC)    no per pt on 03/21/2014 & 02/06/2018   Coronary artery disease    a. s/p CABG 2012.   DISC DISEASE, CERVICAL    Gastroesophageal reflux disease    Headache    "I have headaches all the time" (02/06/2018)   Heart murmur    as a child   History of rheumatic fever    Hyperlipidemia    Hypertension    Hypothyroidism    Insomnia    Osteoarthritis    OSTEOPENIA    PAD (peripheral artery disease) ([place])  a. PTA to R SFA 2015. b. PTA to L SFA 05/2016.   Plantar fasciitis of left foot 10/02/2019   Pre-diabetes    RSD (reflex sympathetic dystrophy) 10/31/2011   Vitamin D deficiency    BP 136/75 (BP Location: Right Arm, Patient Position: Sitting, Cuff Size: Normal)   Pulse 78   Temp 98.5 F (36.9 C) (Oral)   Ht 5' 1.5" (1.562 m)   Wt 135 lb 3.2 oz (61.3 kg)   SpO2 91%   BMI 25.13 kg/m   Opioid Risk Score:   Fall Risk Score:  `1  Depression screen PHQ 2/9  Depression screen [facility] 2/9 02/09/2021 10/15/2015 06/26/2015 04/04/2015 09/10/2014  Decreased Interest 1 0 0 0 0  Down, Depressed, Hopeless 0 0 0 0 0  PHQ - 2 Score 1 0 0 0 0  Altered sleeping 3 - - - -  Tired, decreased energy 2 - - - -  Change in appetite 0 - - - -  Feeling bad or failure about yourself  0 - - - -  Trouble concentrating 0 - - - -  Moving slowly or fidgety/restless 0 - - - -  Suicidal thoughts 0 - - - -  PHQ-9 Score 6 - - - -  Difficult doing work/chores Not difficult at all - - - -  Some recent data might be hidden     Review of Systems  Gastrointestinal:  Positive for abdominal pain and constipation. Negative for abdominal distention.  Musculoskeletal:  Positive for back pain.       Rt shoulder pain Rt knee pain Left lower leg pain  Neurological:  Positive for weakness and numbness.  All other systems reviewed and are negative.     Objective:   Physical Exam Awake, alert, uses cane (in car and RW); NAD L 5th digit bent at unusual angle- chronic.  MS: UE: deltoids 4+/5 on  R and 5-/5 on L; biceps 4+/5 and triceps 4+/5, WE 4+/5 on R: and 5-/5 on L; grip 5-/5 B/L; and FA 4+/5 LE: HF 3-/5 on R; 5-/5 on L; KE 4/5 on R; 5-/5 on L; DF 4/5 on R; 5-/5 on L; PF 5-/5 B/L  Neuro: Decreased sensation in R hand- all 5 digits DTRs- 3+ - almost 4+ in Ues on R; normal on L; Hoffman's on R; not on L;  achilles 3+ on R; 2+ on L; Patella 3+ on L- appears 3-4+ on R (has had TKR).   Also has severe muscle trigger points in scalenes, splenius capitus, upper trap, levators, rhomboids and pecs as wel as lumbar paraspinals- all on R.       Assessment & Plan:   Pt is a 75 yr old female with hx of CAD, s/p CABG x4; HTN, HLD and  pre-DM, here for chronic pain of R neck, shoulder down to R low back and sciatica/radiculopathy  Here for evaluation of chronic pain.   Doesn't have pacemaker- need to get MRI of cervical spine - went over need for MRI since she shows signs of compression in her neck (might also have in lumbar spine, but neck is more important- )don't want her losing all strength and needing to stay in wheelchair to get around- since getting progressively weaker per pt.  2. Will wait on PT until gets MRI results- to see if needs surgery.  Will try Gabapentin again- tried- didn't try very well- and no side effects.  Gabapentin 100 mg in Am and 200 mg nightly- for nerve  pain- HAVE TO TAKE DAILY to help.  Will scheduled trigger point injections at next f/u- to help calm muscle pain down.  Wait on tramadol or other opiates- doesn't like to take medicine.  F/U 6 weeks. To see how doing.

## 2021-02-24 ENCOUNTER — Ambulatory Visit
Admission: RE | Admit: 2021-02-24 | Discharge: 2021-02-24 | Disposition: A | Discharge status: Home / Self Care | Payer: Medicare Other | Source: Ambulatory Visit | Attending: Physical Medicine and Rehabilitation | Admitting: Physical Medicine and Rehabilitation

## 2021-02-24 ENCOUNTER — Other Ambulatory Visit: Payer: Self-pay

## 2021-02-24 DIAGNOSIS — G959 Disease of spinal cord, unspecified: Secondary | ICD-10-CM

## 2021-02-24 DIAGNOSIS — M4712 Other spondylosis with myelopathy, cervical region: Secondary | ICD-10-CM

## 2021-02-25 ENCOUNTER — Ambulatory Visit: Payer: Medicare Other | Admitting: Podiatry

## 2021-03-02 ENCOUNTER — Encounter: Payer: Self-pay | Admitting: Podiatry

## 2021-03-02 ENCOUNTER — Ambulatory Visit: Payer: Medicare Other | Admitting: Podiatry

## 2021-03-02 ENCOUNTER — Other Ambulatory Visit: Payer: Self-pay

## 2021-03-02 DIAGNOSIS — M79676 Pain in unspecified toe(s): Secondary | ICD-10-CM

## 2021-03-02 DIAGNOSIS — B351 Tinea unguium: Secondary | ICD-10-CM | POA: Diagnosis not present

## 2021-03-02 DIAGNOSIS — M778 Other enthesopathies, not elsewhere classified: Secondary | ICD-10-CM

## 2021-03-02 DIAGNOSIS — E1151 Type 2 diabetes mellitus with diabetic peripheral angiopathy without gangrene: Secondary | ICD-10-CM

## 2021-03-02 DIAGNOSIS — L84 Corns and callosities: Secondary | ICD-10-CM

## 2021-03-05 ENCOUNTER — Ambulatory Visit
Admission: RE | Admit: 2021-03-05 | Discharge: 2021-03-05 | Disposition: A | Discharge status: Home / Self Care | Payer: Medicare Other | Source: Ambulatory Visit | Attending: Thoracic Surgery (Cardiothoracic Vascular Surgery) | Admitting: Thoracic Surgery (Cardiothoracic Vascular Surgery)

## 2021-03-05 DIAGNOSIS — R911 Solitary pulmonary nodule: Secondary | ICD-10-CM

## 2021-03-05 NOTE — Progress Notes (Signed)
Subjective: [name] is a pleasant 75 y.o. female patient seen today for at-risk foot care with h/o NIDDM with PAD. She is seen for callus of right hallux and painful thick toenails that are difficult to trim. Pain interferes with ambulation. Aggravating factors include wearing enclosed shoe gear. Pain is relieved with periodic professional debridement.  PCP is [name], FNP. Last visit was: two weeks ago per patient recall.  She states her blood gluose was 99 mg/dl today.  She states she is having pain of her right foot for about 2 months. Points to the bottom of her foot.  Allergies  Allergen Reactions   Atorvastatin Other (See Comments)    Muscle aches Other reaction(s): Other (See Comments) Muscle aches    Objective: Physical Exam  General: [name] is a pleasant 75 y.o. African American female, in NAD. AAO x 3.   Vascular:  Capillary refill time to digits immediate b/l. Faintly palpable DP pulse(s) b/l lower extremities. Faintly palpable PT pulse(s) b/l lower extremities. Pedal hair present. Lower extremity skin temperature gradient within normal limits.  Dermatological:  Pedal skin with normal turgor, texture and tone b/l lower extremities No open wounds b/l lower extremities No interdigital macerations b/l lower extremities Toenails 1-5 b/l elongated, discolored, dystrophic, thickened, crumbly with subungual debris and tenderness to dorsal palpation. Hyperkeratotic lesion(s) R hallux.  No erythema, no edema, no drainage, no fluctuance.  Musculoskeletal:  Normal muscle strength 5/5 to all lower extremity muscle groups bilaterally. No pain crepitus or joint limitation noted with ROM b/l. Hallux valgus with bunion deformity noted b/l lower extremities. Hammertoe(s) noted to the 2-5 bilaterally.  POP at 3rd MPJ. No erythema, no edema, no ecchymosis, no fluctuance, no warmth.  Neurological:  Protective sensation intact 5/5 intact bilaterally with 10g  monofilament b/l. Vibratory sensation intact b/l.  Assessment and Plan:  1. Pain due to onychomycosis of toenail   2. Callus   3. Capsulitis of foot, right   4. Type II diabetes mellitus with peripheral circulatory disorder (HCC)    -Continue diabetic foot care principles. -Patient to continue soft, supportive shoe gear daily. -Discussed capsulitis of right foot and treatment options. Discussed appropriate shoe gear and will start process for diabetic shoes for her.  Will refer to Dr. Landis Martins for capsulitis right 3rd MPJ. Patient in agreement. -Toenails 1-5 b/l were debrided in length and girth with sterile nail nippers and dremel without iatrogenic bleeding.  -Callus(es) R hallux pared utilizing sterile scalpel blade without complication or incident. Total number debrided =1. -Patient to report any pedal injuries to medical professional immediately. -Patient/POA to call should there be question/concern in the interim.  Return in about 3 months (around 06/02/2021).  Marzetta Board, DPM

## 2021-03-09 ENCOUNTER — Other Ambulatory Visit: Payer: Self-pay | Admitting: Family

## 2021-03-09 DIAGNOSIS — M542 Cervicalgia: Secondary | ICD-10-CM

## 2021-03-10 ENCOUNTER — Ambulatory Visit: Payer: Medicare Other | Admitting: Thoracic Surgery (Cardiothoracic Vascular Surgery)

## 2021-03-11 ENCOUNTER — Ambulatory Visit: Payer: Medicare Other | Admitting: Podiatry

## 2021-03-17 ENCOUNTER — Other Ambulatory Visit: Payer: Medicare Other

## 2021-03-17 ENCOUNTER — Telehealth: Payer: Self-pay | Admitting: *Deleted

## 2021-03-17 ENCOUNTER — Other Ambulatory Visit: Payer: Self-pay

## 2021-03-17 NOTE — Telephone Encounter (Signed)
Patient has been measured for diabetic shoes /inserts today -03/17/21

## 2021-03-26 ENCOUNTER — Other Ambulatory Visit: Payer: Self-pay

## 2021-03-26 ENCOUNTER — Encounter: Payer: Self-pay | Admitting: Sports Medicine

## 2021-03-26 ENCOUNTER — Ambulatory Visit: Payer: Medicare Other | Admitting: Sports Medicine

## 2021-03-26 DIAGNOSIS — M2041 Other hammer toe(s) (acquired), right foot: Secondary | ICD-10-CM

## 2021-03-26 DIAGNOSIS — M79674 Pain in right toe(s): Secondary | ICD-10-CM

## 2021-03-26 DIAGNOSIS — E1151 Type 2 diabetes mellitus with diabetic peripheral angiopathy without gangrene: Secondary | ICD-10-CM

## 2021-03-26 DIAGNOSIS — L84 Corns and callosities: Secondary | ICD-10-CM | POA: Diagnosis not present

## 2021-03-26 DIAGNOSIS — M778 Other enthesopathies, not elsewhere classified: Secondary | ICD-10-CM

## 2021-03-26 NOTE — Patient Instructions (Signed)
Neosporin pain relief or Lidocaine cream OTC. Can purchase at [facility]

## 2021-03-26 NOTE — Progress Notes (Signed)
Subjective: [name] is a 75 y.o. female patient who presents to office for evaluation of right foot pain.  Patient reports that her right foot has been sore after her right fourth toe callus was trimmed states that it is slowly getting better and also has pain at the tip of the right great toe most noticeable with pressure with walking.  Patient denies any significant swelling redness warmth drainage or any other symptoms at this time.  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[place]) 02/09/2021   Plantar fasciitis of left foot 10/02/2019   Status post partial hysterectomy 10/24/2018   Discharge from the vagina 10/24/2018   Claudication in peripheral vascular disease ([place]) 02/06/2018   Chronic idiopathic constipation 10/07/2017   CAD in native artery 06/08/2016   Skin inflammation 04/06/2016   Contracture of hand joint 04/06/2016   Microhematuria 11/12/2015   Left shoulder pain 10/15/2015   Trapezius muscle spasm 09/24/2015   Type 2 diabetes mellitus ([place]) 08/07/2015   Urinary frequency 04/04/2015   External hemorrhoid 11/02/2014   Left-sided thoracic back pain 10/03/2014   Gingivitis 10/03/2014   Routine general medical examination at a health care facility 09/10/2014   Total knee replacement status 06/18/2014   Tooth pain 04/18/2014   Claudication ([place]) 03/21/2014   Primary localized osteoarthrosis, lower leg 08/10/2013   Right knee pain 08/02/2013   Upper respiratory infection 08/02/2013   Dyspepsia 11/16/2012   Knee pain, right 02/18/2012   PAD (peripheral artery disease) ([place]) 12/03/2011   PVD (peripheral vascular disease) ([place]) 10/31/2011   RSD (reflex sympathetic dystrophy) 10/31/2011   Vitamin D deficiency    Preop exam for internal medicine 10/28/2011   Anxiety  03/11/2011   COPD (chronic obstructive pulmonary disease) ([place]) 02/02/2011   Anemia 02/02/2011   History of rheumatic fever 02/02/2011   Insomnia 02/02/2011   Coronary atherosclerosis 09/08/2010   CONSTIPATION 02/19/2010   TOBACCO ABUSE 07/21/2009   MURMUR 06/17/2009   [place]helly DISEASE, CERVICAL 03/20/2009   Pain in Soft Tissues of Limb 05/23/2008   Hypothyroidism 09/07/2007   Hyperlipidemia LDL goal <70 09/07/2007   Essential hypertension 09/07/2007   ALLERGIC RHINITIS 09/07/2007   GERD 09/07/2007   Primary osteoarthritis of right knee 09/07/2007   Osteopenia 09/07/2007   COLONIC POLYPS, HX OF 09/07/2007    Current Outpatient Medications on File Prior to Visit  Medication Sig Dispense Refill   albuterol (VENTOLIN HFA) 108 (90 Base) MCG/ACT inhaler INHALE 1 PUFF BY MOUTH EVERY 6 HOURS AS NEEDED FOR SHORTNESS OF BREATH AND FOR WHEEZING     alclomethasone (ACLOVATE) 0.05 % cream Apply 1 application topically as needed.      amLODipine (NORVASC) 10 MG tablet TAKE ONE TABLET BY MOUTH ONCE DAILY 90 tablet 1   aspirin EC 81 MG tablet Take 81 mg by mouth daily.     Calcium Carb-Cholecalciferol (CALCIUM 1000 + D PO) Take 1 tablet by mouth every other day.     co-enzyme Q-10 30 MG capsule Take 30 mg by mouth as needed.      CONSTULOSE 10 GM/15ML solution TAKE 30 ML BY MOUTH TWICE DAILY AS NEEDED     cyclobenzaprine (FLEXERIL) 10 MG tablet Take 10 mg by mouth at bedtime as needed.     cyclobenzaprine (FLEXERIL) 5 MG tablet TAKE 1 TABLET BY  MOUTH TWICE DAILY AS NEEDED FOR MUSCLE SPASM     escitalopram (LEXAPRO) 5 MG tablet one po am daily as directed     EUTHYROX 125 MCG tablet Take 125 mcg by mouth daily.     ezetimibe (ZETIA) 10 MG tablet Take 10 mg by mouth daily.     fluticasone (FLONASE) 50 MCG/ACT nasal spray SMARTSIG:1-2 Spray(s) Both Nares Daily PRN     fluticasone (FLONASE) 50 MCG/ACT nasal spray USE 1 TO 2 SPRAY(S) IN EACH NOSTRIL ONCE DAILY AS NEEDED FOR 30 DAYS     gabapentin  (NEURONTIN) 100 MG capsule Take 1 capsule (100 mg total) by mouth 3 (three) times daily. Take 1 pill in AM- and 2 pills at night. - for nerve pain 90 capsule 5   hydrOXYzine (ATARAX/VISTARIL) 25 MG tablet Take 25 mg by mouth 3 (three) times daily.      Lancets (ONETOUCH DELICA PLUS [id number]) MISC 1 each by Other route 2 (two) times daily.     levothyroxine (SYNTHROID, LEVOTHROID) 150 MCG tablet Take 150 mcg by mouth daily before breakfast.     meloxicam (MOBIC) 7.5 MG tablet Take 7.5 mg by mouth daily.     Multiple Vitamin (MULTIVITAMIN WITH MINERALS) TABS tablet Take 1 tablet by mouth every morning. Centrum     nystatin cream (MYCOSTATIN) Apply topically.     ONETOUCH VERIO test strip 1 each by Other route 2 (two) times daily.     pantoprazole (PROTONIX) 40 MG tablet Take 1 tablet (40 mg total) by mouth daily. 30 tablet 6   Propylene Glycol 0.6 % SOLN Place 1 drop into both eyes every morning.     rosuvastatin (CRESTOR) 40 MG tablet Take 1 tablet (40 mg total) by mouth daily. 90 tablet 3   triamcinolone ointment (KENALOG) 0.1 %      Vitamin D, Cholecalciferol, 25 MCG (1000 UT) CAPS Take 1 capsule by mouth daily.     Vitamin D, Ergocalciferol, (DRISDOL) 1.25 MG (50000 UNIT) CAPS capsule Take 50,000 Units by mouth once a week.     Vitamins/Minerals TABS Take by mouth.     Current Facility-Administered Medications on File Prior to Visit  Medication Dose Route Frequency Provider Last Rate Last Admin   0.9 %  sodium chloride infusion  500 mL Intravenous Once Milus Banister, MD       tranexamic acid (CYKLOKAPRON) 2,000 mg in sodium chloride 0.9 % 50 mL Topical Application  6,195 mg Topical Once Porterfield, Amber, PA-C        Allergies  Allergen Reactions   Atorvastatin Other (See Comments)    Muscle aches Other reaction(s): Other (See Comments) Muscle aches    Objective:  General: Alert and oriented x3 in no acute distress  Dermatology: No open lesions bilateral lower extremities,  corns noted to the right fourth toe and distal tuft of the right great toe most recently trimmed very minimal keratosis, no webspace macerations, no ecchymosis bilateral, all nails x 10 are well manicured.  Vascular: Dorsalis Pedis and Posterior Tibial pedal pulses faintly palpable, Capillary Fill Time 5 seconds,(+) pedal hair growth bilateral, no edema bilateral lower extremities, Temperature gradient within normal limits.  Neurology: Johney Maine sensation intact via light touch bilateral.  Musculoskeletal: No tenderness to palpation at the ball of the right foot however there is pain to the distal tuft of the right great toe as well as the right fourth toe at area of keratosis with significant digital deformity noted.  Assessment and Plan: Problem List  Items Addressed This Visit   None Visit Diagnoses     Callus    -  Primary   Capsulitis of foot, right       Toe pain, right       Hammertoe of right foot       Type II diabetes mellitus with peripheral circulatory disorder (HCC)            -Complete examination performed -Discussed treatment options for likely pain and keratosis and resolved capsulitis on the right foot -No injection given at this time since pain at the ball of the foot has resolved -Dispensed toe cap for the right great toe and right fourth toe to use as directed -Continue with good supportive shoes that do not rub -May use Neosporin pain relief or lidocaine cream as needed for pain to toes -Patient to return to office as needed or sooner if condition worsens.  Landis Martins, DPM

## 2021-04-01 NOTE — Progress Notes (Deleted)
HPI: FU coronary artery disease. S/P CABG March 2012 with a LIMA to the LAD, a sequential saphenous vein graft to the diagonal and obtuse marginal and a saphenous vein graft to the acute marginal. Fu echo in August of 2012 showed an EF of 50-55, atrial septal aneurysm. Patient followed by Dr. Gwenlyn Found for peripheral vascular disease. Multiple interventions previously. Had atherectomy of distal right SFA June 2019. Carotid Dopplers July 2019 showed 1 to 39% bilateral stenosis. ABIs December 2021 showed mild right lower extremity arterial disease and moderate left extremity arterial disease. Nuclear study March 2022 showed ejection fraction 68% and no ischemia or infarction.  Since last seen,   Current Outpatient Medications  Medication Sig Dispense Refill   albuterol (VENTOLIN HFA) 108 (90 Base) MCG/ACT inhaler INHALE 1 PUFF BY MOUTH EVERY 6 HOURS AS NEEDED FOR SHORTNESS OF BREATH AND FOR WHEEZING     alclomethasone (ACLOVATE) 0.05 % cream Apply 1 application topically as needed.      amLODipine (NORVASC) 10 MG tablet TAKE ONE TABLET BY MOUTH ONCE DAILY 90 tablet 1   aspirin EC 81 MG tablet Take 81 mg by mouth daily.     Calcium Carb-Cholecalciferol (CALCIUM 1000 + D PO) Take 1 tablet by mouth every other day.     co-enzyme Q-10 30 MG capsule Take 30 mg by mouth as needed.      CONSTULOSE 10 GM/15ML solution TAKE 30 ML BY MOUTH TWICE DAILY AS NEEDED     cyclobenzaprine (FLEXERIL) 10 MG tablet Take 10 mg by mouth at bedtime as needed.     cyclobenzaprine (FLEXERIL) 5 MG tablet TAKE 1 TABLET BY MOUTH TWICE DAILY AS NEEDED FOR MUSCLE SPASM     escitalopram (LEXAPRO) 5 MG tablet one po am daily as directed     EUTHYROX 125 MCG tablet Take 125 mcg by mouth daily.     ezetimibe (ZETIA) 10 MG tablet Take 10 mg by mouth daily.     fluticasone (FLONASE) 50 MCG/ACT nasal spray SMARTSIG:1-2 Spray(s) Both Nares Daily PRN     fluticasone (FLONASE) 50 MCG/ACT nasal spray USE 1 TO 2 SPRAY(S) IN EACH NOSTRIL  ONCE DAILY AS NEEDED FOR 30 DAYS     gabapentin (NEURONTIN) 100 MG capsule Take 1 capsule (100 mg total) by mouth 3 (three) times daily. Take 1 pill in AM- and 2 pills at night. - for nerve pain 90 capsule 5   hydrOXYzine (ATARAX/VISTARIL) 25 MG tablet Take 25 mg by mouth 3 (three) times daily.      Lancets (ONETOUCH DELICA PLUS [id number]) MISC 1 each by Other route 2 (two) times daily.     levothyroxine (SYNTHROID, LEVOTHROID) 150 MCG tablet Take 150 mcg by mouth daily before breakfast.     meloxicam (MOBIC) 7.5 MG tablet Take 7.5 mg by mouth daily.     Multiple Vitamin (MULTIVITAMIN WITH MINERALS) TABS tablet Take 1 tablet by mouth every morning. Centrum     nystatin cream (MYCOSTATIN) Apply topically.     ONETOUCH VERIO test strip 1 each by Other route 2 (two) times daily.     pantoprazole (PROTONIX) 40 MG tablet Take 1 tablet (40 mg total) by mouth daily. 30 tablet 6   Propylene Glycol 0.6 % SOLN Place 1 drop into both eyes every morning.     rosuvastatin (CRESTOR) 40 MG tablet Take 1 tablet (40 mg total) by mouth daily. 90 tablet 3   triamcinolone ointment (KENALOG) 0.1 %  Vitamin D, Cholecalciferol, 25 MCG (1000 UT) CAPS Take 1 capsule by mouth daily.     Vitamin D, Ergocalciferol, (DRISDOL) 1.25 MG (50000 UNIT) CAPS capsule Take 50,000 Units by mouth once a week.     Vitamins/Minerals TABS Take by mouth.     Current Facility-Administered Medications  Medication Dose Route Frequency Provider Last Rate Last Admin   0.9 %  sodium chloride infusion  500 mL Intravenous Once Milus Banister, MD       Facility-Administered Medications Ordered in Other Visits  Medication Dose Route Frequency Provider Last Rate Last Admin   tranexamic acid (CYKLOKAPRON) 2,000 mg in sodium chloride 0.9 % 50 mL Topical Application  0,960 mg Topical Once [place], [employer], PA-C         Past Medical History:  Diagnosis Date   Anemia    Anxiety    hx   Arthritis    "my whole body"   Atrial septal  aneurysm    COLONIC POLYPS, HX OF    COPD (chronic obstructive pulmonary disease) (HCC)    no per pt on 03/21/2014 & 02/06/2018   Coronary artery disease    a. s/p CABG 2012.   DISC DISEASE, CERVICAL    Gastroesophageal reflux disease    Headache    "I have headaches all the time" (02/06/2018)   Heart murmur    as a child   History of rheumatic fever    Hyperlipidemia    Hypertension    Hypothyroidism    Insomnia    Osteoarthritis    OSTEOPENIA    PAD (peripheral artery disease) ([place])    a. PTA to R SFA 2015. b. PTA to L SFA 05/2016.   Plantar fasciitis of left foot 10/02/2019   Pre-diabetes    RSD (reflex sympathetic dystrophy) 10/31/2011   Vitamin D deficiency     Past Surgical History:  Procedure Laterality Date   ABDOMINAL HYSTERECTOMY  1977   "partial"   BALLOON ANGIOPLASTY, ARTERY Right 03/21/2014   SFA   CARDIAC CATHETERIZATION  2011    CARPAL TUNNEL RELEASE Right 2005   CATARACT EXTRACTION Left 2013   CORONARY ARTERY BYPASS GRAFT  10/2009   LIMA to the LAD, saphenous vein graft to the acute marginal, saphenous vein graft to the diagonal and obtuse marginal.   DILATION AND CURETTAGE OF UTERUS  1978   JOINT REPLACEMENT     LOWER EXTREMITY ANGIOGRAM N/A 03/21/2014   Procedure: LOWER EXTREMITY ANGIOGRAM;  Surgeon: Lorretta Harp, MD;  Location: [facility] CATH LAB;  Service: Cardiovascular;  Laterality: N/A;   LOWER EXTREMITY ANGIOGRAM  06/07/2016   Abdominal aortogram/bilateral iliac angiogram/bifemoral runoff   LOWER EXTREMITY ANGIOGRAPHY N/A 02/06/2018   Procedure: LOWER EXTREMITY ANGIOGRAPHY;  Surgeon: Lorretta Harp, MD;  Location: [facility] LAB;  Service: Cardiovascular;  Laterality: N/A;   PERIPHERAL VASCULAR ATHERECTOMY  02/06/2018   Procedure: PERIPHERAL VASCULAR ATHERECTOMY;  Surgeon: Lorretta Harp, MD;  Location: [facility];  Service: Cardiovascular;;   PERIPHERAL VASCULAR CATHETERIZATION Bilateral 06/07/2016   Procedure: Lower Extremity Angiography;   Surgeon: Lorretta Harp, MD;  Location: [facility];  Service: Cardiovascular;  Laterality: Bilateral;   PERIPHERAL VASCULAR CATHETERIZATION Left 06/07/2016   Procedure: Peripheral Vascular Atherectomy;  Surgeon: Lorretta Harp, MD;  Location: [facility];  Service: Cardiovascular;  Laterality: Left;  SFA   PERIPHERAL VASCULAR CATHETERIZATION Left 06/07/2016   Procedure: Peripheral Vascular Balloon Angioplasty;  Surgeon: Lorretta Harp, MD;  Location:  [facility];  Service: Cardiovascular;  Laterality: Left;  SFA   THYROIDECTOMY  1995   TONSILLECTOMY     TOTAL KNEE ARTHROPLASTY Right 06/18/2014   Procedure: RIGHT TOTAL KNEE ARTHROPLASTY;  Surgeon: Tobi Bastos, MD;  Location: [facility];  Service: Orthopedics;  Laterality: Right;    Social History   Socioeconomic History   Marital status: Divorced    Spouse name: Not on file   Number of children: 2   Years of education: 14   Highest education level: Not on file  Occupational History   Occupation: Retired    Fish farm manager: RETIRED  Tobacco Use   Smoking status: Former    Packs/day: 0.80    Years: 45.00    Pack years: 36.00    Types: Cigarettes    Quit date: 08/23/2009    Years since quitting: 11.6   Smokeless tobacco: Never  Vaping Use   Vaping Use: Never used  Substance and Sexual Activity   Alcohol use: Yes    Alcohol/week: 2.0 standard drinks    Types: 2 Glasses of wine per week   Drug use: Never   Sexual activity: Not Currently  Other Topics Concern   Not on file  Social History Narrative   Not on file   Social Determinants of Health   Financial Resource Strain: Not on file  Food Insecurity: Not on file  Transportation Needs: Not on file  Physical Activity: Not on file  Stress: Not on file  Social Connections: Not on file  Intimate Partner Violence: Not on file    Family History  Problem Relation Age of Onset   Arthritis Maternal Aunt    Heart disease Maternal Aunt    Hypertension  Maternal Aunt    Diabetes Maternal Aunt    Colon cancer Neg Hx    Kidney disease Neg Hx    Liver disease Neg Hx    Throat cancer Neg Hx    Stomach cancer Neg Hx    Colon polyps Neg Hx     ROS: no fevers or chills, productive cough, hemoptysis, dysphasia, odynophagia, melena, hematochezia, dysuria, hematuria, rash, seizure activity, orthopnea, PND, pedal edema, claudication. Remaining systems are negative.  Physical Exam: Well-developed well-nourished in no acute distress.  Skin is warm and dry.  HEENT is normal.  Neck is supple.  Chest is clear to auscultation with normal expansion.  Cardiovascular exam is regular rate and rhythm.  Abdominal exam nontender or distended. No masses palpated. Extremities show no edema. neuro grossly intact  ECG- personally reviewed  A/P  1 coronary artery disease-patient denies recurrent chest pain.  Most recent nuclear study showed no ischemia.  Continue medical therapy with aspirin and statin.  2 hypertension-patient's blood pressure is controlled.  Continue present medications and follow.  3 hyperlipidemia-continue statin.  4 peripheral vascular disease-patient denies claudication.  She is followed by Dr. Gwenlyn Found.  5 carotid artery disease-mild on most recent Dopplers.  Kirk Ruths, MD

## 2021-04-13 ENCOUNTER — Encounter
Payer: Medicare Other | Attending: Physical Medicine and Rehabilitation | Admitting: Physical Medicine and Rehabilitation

## 2021-04-13 ENCOUNTER — Ambulatory Visit: Payer: Medicare Other | Admitting: Cardiology

## 2021-04-13 DIAGNOSIS — G959 Disease of spinal cord, unspecified: Secondary | ICD-10-CM | POA: Insufficient documentation

## 2021-04-13 DIAGNOSIS — M4712 Other spondylosis with myelopathy, cervical region: Secondary | ICD-10-CM | POA: Insufficient documentation

## 2021-04-13 DIAGNOSIS — R269 Unspecified abnormalities of gait and mobility: Secondary | ICD-10-CM | POA: Insufficient documentation

## 2021-04-13 DIAGNOSIS — R252 Cramp and spasm: Secondary | ICD-10-CM | POA: Insufficient documentation

## 2021-04-13 DIAGNOSIS — M5416 Radiculopathy, lumbar region: Secondary | ICD-10-CM | POA: Insufficient documentation

## 2021-04-28 ENCOUNTER — Other Ambulatory Visit: Payer: Self-pay

## 2021-04-28 ENCOUNTER — Ambulatory Visit: Payer: Medicare Other | Admitting: Physician Assistant

## 2021-04-28 ENCOUNTER — Telehealth: Payer: Self-pay | Admitting: Podiatry

## 2021-04-28 ENCOUNTER — Ambulatory Visit: Payer: Medicare Other | Admitting: Thoracic Surgery (Cardiothoracic Vascular Surgery)

## 2021-04-28 VITALS — BP 131/67 | HR 80 | Resp 20 | Ht 61.05 in | Wt 134.0 lb

## 2021-04-28 DIAGNOSIS — R911 Solitary pulmonary nodule: Secondary | ICD-10-CM

## 2021-04-28 DIAGNOSIS — R918 Other nonspecific abnormal finding of lung field: Secondary | ICD-10-CM

## 2021-04-28 NOTE — Telephone Encounter (Signed)
Pt left message asking about diabetic shoes status.[name] Upon checking the documents were signed for medicare but Dustin Folks is a NP and medicare will not allow an NP or PA to sign for the diabetic shoes.   I have called the office and was told that Dr Manuella Ghazi is the overseeing md so I have put it in his name and will fax to get a signature from the md.

## 2021-04-28 NOTE — Progress Notes (Signed)
[address]       [place] [address]             [phone]    HPI: [name] returns for a scheduled follow-up visit regarding lung nodules.  [name] is a 75 year old woman with a past history of tobacco abuse (quit 2011), COPD, lung nodules, CAD, coronary bypass grafting in 2012, hypertension, and gastroesophageal reflux.  She was found to have a possible right apical mass on a CT of the cervical spine in 2011.  CT of the chest showed bilateral apical pleural parenchymal densities which were not hypermetabolic on PET.  She had multiple other small lung nodules that have been followed since then.  Dr. Roxan Hockey saw her in January of this year.   There was a nodule in the right upper lobe that appeared to be slightly larger.  She now returns for 40-month follow-up.  She says that she occasionally gets short of breath with exertion off and on. She has sharp pains in her chest at night which she attributes of acid reflux because they are usually when she eats tomatoes for dinner. Her biggest complaint is her right toe and the callus that is painful/irritated since visiting the diabetic foot clinic.   Past Medical History:  Diagnosis Date   Anemia    Anxiety    hx   Arthritis    "my whole body"   Atrial septal aneurysm    COLONIC POLYPS, HX OF    COPD (chronic obstructive pulmonary disease) (HCC)    no per pt on 03/21/2014 & 02/06/2018   Coronary artery disease    a. s/p CABG 2012.   DISC DISEASE, CERVICAL    Gastroesophageal reflux disease    Headache    "I have headaches all the time" (02/06/2018)   Heart murmur    as a child   History of rheumatic fever    Hyperlipidemia    Hypertension    Hypothyroidism    Insomnia    Osteoarthritis    OSTEOPENIA    PAD (peripheral artery disease) ([place])    a. PTA to R SFA 2015. b. PTA to L SFA 05/2016.   Plantar fasciitis of left foot 10/02/2019   Pre-diabetes    RSD (reflex sympathetic dystrophy)  10/31/2011   Vitamin D deficiency     Current Outpatient Medications  Medication Sig Dispense Refill   albuterol (VENTOLIN HFA) 108 (90 Base) MCG/ACT inhaler INHALE 1 PUFF BY MOUTH EVERY 6 HOURS AS NEEDED FOR SHORTNESS OF BREATH AND FOR WHEEZING     alclomethasone (ACLOVATE) 0.05 % cream Apply 1 application topically as needed.      amLODipine (NORVASC) 10 MG tablet TAKE ONE TABLET BY MOUTH ONCE DAILY 90 tablet 1   aspirin EC 81 MG tablet Take 81 mg by mouth daily.     Calcium Carb-Cholecalciferol (CALCIUM 1000 + D PO) Take 1 tablet by mouth every other day.     clopidogrel (PLAVIX) 75 MG tablet Take 1 tablet (75 mg total) by mouth daily with breakfast. 90 tablet 3   co-enzyme Q-10 30 MG capsule Take 30 mg by mouth as needed.      CONSTULOSE 10 GM/15ML solution TAKE 30 ML BY MOUTH TWICE DAILY AS NEEDED     cyclobenzaprine (FLEXERIL) 5 MG tablet TAKE 1 TABLET BY MOUTH TWICE DAILY AS NEEDED FOR MUSCLE SPASM     ezetimibe (ZETIA) 10 MG tablet Take 10 mg by mouth daily.  hydrOXYzine (ATARAX/VISTARIL) 25 MG tablet Take 25 mg by mouth 3 (three) times daily.      Lancets (ONETOUCH DELICA PLUS [id number]) MISC 1 each by Other route 2 (two) times daily.     levothyroxine (SYNTHROID, LEVOTHROID) 150 MCG tablet Take 150 mcg by mouth daily before breakfast.     LINZESS 290 MCG CAPS capsule Take 290 mcg by mouth daily.     Multiple Vitamin (MULTIVITAMIN WITH MINERALS) TABS tablet Take 1 tablet by mouth every morning. Centrum     nystatin cream (MYCOSTATIN) Apply topically.     ONETOUCH VERIO test strip 1 each by Other route 2 (two) times daily.     pantoprazole (PROTONIX) 40 MG tablet Take 1 tablet (40 mg total) by mouth daily. 30 tablet 6   Propylene Glycol 0.6 % SOLN Place 1 drop into both eyes every morning.     rosuvastatin (CRESTOR) 40 MG tablet Take 1 tablet (40 mg total) by mouth daily. 90 tablet 3   triamcinolone ointment (KENALOG) 0.1 %      Vitamin D, Ergocalciferol, (DRISDOL) 1.25 MG (50000  UNIT) CAPS capsule Take 50,000 Units by mouth once a week.     Vitamins/Minerals TABS Take by mouth.     Current Facility-Administered Medications  Medication Dose Route Frequency Provider Last Rate Last Admin   0.9 %  sodium chloride infusion  500 mL Intravenous Once Milus Banister, MD       Facility-Administered Medications Ordered in Other Visits  Medication Dose Route Frequency Provider Last Rate Last Admin   tranexamic acid (CYKLOKAPRON) 2,000 mg in sodium chloride 0.9 % 50 mL Topical Application  5,102 mg Topical Once Ardeen Jourdain, PA-C        Physical Exam Vitals:   04/28/21 1323  BP: 131/67  Pulse: 80  Resp: 20  SpO43: 9%    75 year old woman in no acute distress  CV-NSR, no murmur Pulm: CTA bilaterally and in all fields Abd: no tenderness Ext: right lower ext edema, right toe with callus and some erythema    Diagnostic Tests:  CLINICAL DATA:  Follow-up pulmonary nodule   EXAM: CT CHEST WITHOUT CONTRAST   TECHNIQUE: Multidetector CT imaging of the chest was performed following the standard protocol without IV contrast.   COMPARISON:  08/21/2020   FINDINGS: Cardiovascular: The heart is normal in size. No pericardial effusion.   No evidence of thoracic aortic aneurysm. Atherosclerotic calcifications of the aortic arch.   Three vessel coronary atherosclerosis. Postsurgical changes related to prior CABG.   Mediastinum/Nodes: No suspicious mediastinal lymphadenopathy.   Status post thyroidectomy.   Lungs/Pleura: Biapical pleural-parenchymal scarring.   Moderate centrilobular and paraseptal emphysematous changes, upper lung predominant.   No focal consolidation.   9 x 5 mm irregular nodule in the posterior right upper lobe (series 8/image 47), previously 8 x 3 mm when remeasured in a similar fashion.   Additional scattered small right lung nodules are similar, measuring up to 4 mm in the right lower lobe (series 8/image 108).   No pleural  effusion or pneumothorax.   Upper Abdomen: Visualized upper abdomen is notable for scattered hepatic cysts and vascular calcifications.   Musculoskeletal: Mild degenerative changes of the visualized thoracolumbar spine. Median sternotomy.   IMPRESSION: 9 x 5 mm irregular nodule in the posterior right upper lobe, mildly progressive. Given continued slow progression, this is concerning for indolent neoplasm including primary bronchogenic neoplasm. However, this remains beneath the size threshold for PET sensitivity. Consider follow-up CT chest in 6  months.   Aortic Atherosclerosis (ICD10-I70.0) and Emphysema (ICD10-J43.9).     Electronically Signed   By: Julian Hy M.D.   On: 03/06/2021 23:50   CT CHEST WITHOUT CONTRAST   TECHNIQUE: Multidetector CT imaging of the chest was performed following the standard protocol without IV contrast.   COMPARISON:  12/17/2019   FINDINGS: Cardiovascular: Heart size is within normal limits. Previous median sternotomy and CABG procedure. Aortic atherosclerosis.   Mediastinum/Nodes: Thyroidectomy. The trachea appears patent and is midline. Normal appearance of the esophagus. No supraclavicular, axillary or mediastinal adenopathy.   Lungs/Pleura: Moderate to severe centrilobular and paraseptal emphysema. No pleural effusion, airspace consolidation or atelectasis. Scattered lung nodules are again noted:   Posterior right upper lobe nodule measures 3 mm, image 48/8. Unchanged   The previously characterized enlarging posterior right upper lobe lung nodule appears to represent either 2 adjacent nodules or a single enlarging bilobed nodule. On 11/08/2017 this consisted of a single nodular component measuring 4 mm, which has increased to 6 mm on today's study. The adjacent component is new from 11/08/2017 measuring 4 mm. Since 12/17/2019 there has been no significant change in the appearance of this lesion.   2 mm nodule in the  superior segment of right lower lobe is stable, image 62/8.   Anterior basal right upper lobe nodule is stable measuring 4 mm, image 63/8.   Persistent, but stable 9 mm pure ground-glass nodule in the lateral right lower lobe is unchanged, image 91/8.   Posterolateral right lung base nodule is unchanged measuring 3 mm, image 112/8.   4 mm lingular ground-glass nodule is unchanged, additional scattered calcified and noncalcified punctate nodules are unchanged.   Stable pleuroparenchymal scarring within the lung apices, image 22/8.   Upper Abdomen: Stable 8 mm low-density structure in left hepatic lobe, image 136/2. Posterior right hepatic lobe cyst measures 2.1 cm the, image 152/2. Cholecystectomy. Aortic atherosclerosis. Similar appearance of bilateral adrenal thickening.   Musculoskeletal: No acute abnormality.   IMPRESSION: 1. Previously described enlarging nodule within the posterior rightupper lobe appears unchanged from 12/17/2019, but has clearly increased in size from 11/08/2017. Continued interval follow-up is recommended with repeat exam in 6-12 months. 2. Additional small, solid pulmonary nodules are stable. 3. Two subtle ground-glass attenuating nodules in the right lower lobe and lingula are unchanged. Attention in these areas on follow-up imaging advised. 4. Emphysema and aortic atherosclerosis.   Aortic Atherosclerosis (ICD10-I70.0) and Emphysema (ICD10-J43.9).     Electronically Signed   By: Kerby Moors M.D.   On: 08/21/2020 13:42    Impression:  [name] is a 75 year old woman with a past history of tobacco abuse (quit 2011), COPD, lung nodules, CAD, coronary bypass grafting in 2012, hypertension, and gastroesophageal reflux. Slow progression on pulmonary nodule. 9 x 5 mm irregular nodule in the posterior right upper lobe (series8/image 47), previously 8 x 3 mm when remeasured in a similar fashion  Tobacco abuse-quit 2011  CAD-she does have  occasional chest pain.  Dr. Stanford Breed is following. Difficult to tell if this is cardiac related. Would favor acid reflux based on her symptoms.   Acid reflux-she is limiting her diet. She is on Protonix. Most of the time it occurs at night. Sometimes she does have associated SOB.   Callus on her right toe-issues with swelling in her right leg with a new callus on her toe that's painful since she went to the diabetic foot clinic. She has an appointment to see her PCP in 10 days  but is going to head over to see if they will see her today.   Plan: Since her nodule is enlarging I will schedule her with the physician clinic. She will likely need repeat imaging in 6 months. We will try to get in her in for an appointment asap.   Nicholes Rough, PA-C [facility] [phone]

## 2021-04-29 ENCOUNTER — Encounter: Payer: Self-pay | Admitting: Podiatrist

## 2021-04-29 ENCOUNTER — Ambulatory Visit: Payer: Medicare Other | Admitting: Podiatrist

## 2021-04-29 DIAGNOSIS — M2041 Other hammer toe(s) (acquired), right foot: Secondary | ICD-10-CM

## 2021-04-29 DIAGNOSIS — L84 Corns and callosities: Secondary | ICD-10-CM

## 2021-04-29 NOTE — Patient Instructions (Signed)
Hammer Toe Hammer toe is a change in the shape, or a deformity, of the toe. The deformity causes the middle joint of the toe to stay bent. Hammer toe starts gradually. At first, the toe can be straightened. Then over time, the toe deformity becomes stiff, inflexible, and permanently bent. Hammer toe usually affects the second, third, or fourth toe. A hammer toe causes pain, especially when wearing shoes. Corns and calluses can result from the toe rubbing against the inside of the shoe. Early treatments to keep the toe straight may relieve pain. As the deformity of the toe becomes stiff and permanent, surgery may be needed to straighten the toe. What are the causes? This condition is caused by abnormal bending of the toe joint that is closest to your foot. Over time, the toe bending downward pulls on the muscles and connections (tendons) of the toe joint, making them weak and stiff. Wearing shoes that are too narrow in the toe box and do not allow toes to fully straighten can cause this condition. What increases the risk? You are more likely to develop this condition if you: Are an older female. Wear shoes that are too small, or wear high-heeled shoes that pinch your toes. Have a second toe that is longer than your big toe (first toe). Injure your foot or toe. Have arthritis, or have a nerve or muscle disorder. Have diabetes or a condition known as Charcot joint, which may cause you to walk abnormally. Have a family history of hammer toe. Are a Engineer, mining. What are the signs or symptoms? Pain and deformity of the toe are the main symptoms of this condition. The pain is worse when wearing shoes, walking, or running. Other symptoms may include: A thickened patch of skin, called a corn or callus, that forms over the top of the bent part of the toe or between the toes. Redness and a burning feeling on the bent toe. An open sore that forms on the top of the bent toe. Not being able to straighten  the affected toe. How is this diagnosed? This condition is diagnosed based on your symptoms and a physical exam. During the exam, your health care provider will try to straighten your toe to see how stiff the deformity is. You may also have tests, such as: A blood test to check for rheumatoid arthritis or diabetes. An X-ray to show how severe the toe deformity is. How is this treated? Treatment for this condition depends on whether the toe is flexible or deformed and no longer moveable. In less severe cases, a hammer toe can be straightened without surgery. These treatments include: Taping the toe into a straightened position. Using pads and cushions to protect the bent toe. Wearing shoes that provide enough room for the toes. Doing toe-stretching exercises at home. Taking an NSAID, such as ibuprofen, to reduce pain and swelling. Using special orthotics or insoles for pain relief and to improve walking. If these treatments do not help or the toe has a severe deformity and cannot be straightened, surgery is the next option. The most common surgeries used to straighten a hammer toe include: Arthroplasty or osteotomy. Part of the toe joint is reconstructed or removed, which allows the toe to straighten. Fusion. Cartilage between the two bones of the joint is taken out, and the bones are fused together into one longer bone. Implantation. Part of the bone is removed and replaced with an implant to allow the toe to move again. Flexor tendon transfer.  The tendons that curl the toes down (flexor tendons) are repositioned. Follow these instructions at home: Take over-the-counter and prescription medicines only as told by your health care provider. Do toe-straightening and stretching exercises as told by your health care provider. Keep all follow-up visits. This is important. How is this prevented? Wear shoes that fit properly and give your toes enough room. Shoes should not cause pain. Buy shoes at  the end of the day to make sure they fit well, since your foot may swell during the day. Make sure they are comfortable before you buy them. As you age, your shoe size might change, including the width. Measure both feet and buy shoes for the larger foot. A shoe repair store might be able to stretch shoes that feel tight in spots. Do not wear high-heeled shoes or shoes with pointed toes. Contact a health care provider if: Your pain gets worse. Your toe becomes red or swollen. You develop an open sore on your toe. Summary Hammer toe is a condition that gradually causes your toe to become bent and stiff. Hammer toe can be treated by taping the toe into a straightened position and doing toe-stretching exercises. If these treatments do not help, surgery may be needed. To prevent this condition, wear shoes that fit properly, give your toes enough room, and do not cause pain. This information is not intended to replace advice given to you by your health care provider. Make sure you discuss any questions you have with your health care provider. Document Revised: 11/15/2019 Document Reviewed: 11/15/2019 Elsevier Patient Education  2022 [employer].

## 2021-04-29 NOTE — Progress Notes (Signed)
Chief Complaint  [name] presents with   Callouses    Right 4th digit corn/callous, pt requests trim.     HPI: [name] is 75 y.o. female who presents today for  a painful corn on the right fourth toe.  She relates she has pain in the toe when she walks.  She is diabetic and denies any numbness or tingling in her feet.  She also has a scratch on the right great toe- she is soaking is epsom salts and using neosporin at home.   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[place]) 02/09/2021   Plantar fasciitis of left foot 10/02/2019   Status post partial hysterectomy 10/24/2018   Discharge from the vagina 10/24/2018   Claudication in peripheral vascular disease ([place]) 02/06/2018   Chronic idiopathic constipation 10/07/2017   CAD in native artery 06/08/2016   Skin inflammation 04/06/2016   Contracture of hand joint 04/06/2016   Microhematuria 11/12/2015   Left shoulder pain 10/15/2015   Trapezius muscle spasm 09/24/2015   Type 2 diabetes mellitus ([place]) 08/07/2015   Urinary frequency 04/04/2015   External hemorrhoid 11/02/2014   Left-sided thoracic back pain 10/03/2014   Gingivitis 10/03/2014   Routine general medical examination at a health care facility 09/10/2014   Total knee replacement status 06/18/2014   Tooth pain 04/18/2014   Claudication ([place]) 03/21/2014   Primary localized osteoarthrosis, lower leg 08/10/2013   Right knee pain 08/02/2013   Upper respiratory infection 08/02/2013   Dyspepsia 11/16/2012   Knee pain, right 02/18/2012   PAD (peripheral artery disease) ([place]) 12/03/2011   PVD (peripheral vascular disease) ([place]) 10/31/2011   RSD (reflex sympathetic dystrophy) 10/31/2011   Vitamin D deficiency    Preop exam for internal medicine 10/28/2011   Anxiety 03/11/2011   COPD  (chronic obstructive pulmonary disease) ([place]) 02/02/2011   Anemia 02/02/2011   History of rheumatic fever 02/02/2011   Insomnia 02/02/2011   Coronary atherosclerosis 09/08/2010   CONSTIPATION 02/19/2010   TOBACCO ABUSE 07/21/2009   MURMUR 06/17/2009   [place] DISEASE, CERVICAL 03/20/2009   Pain in Soft Tissues of Limb 05/23/2008   Hypothyroidism 09/07/2007   Hyperlipidemia LDL goal <70 09/07/2007   Essential hypertension 09/07/2007   ALLERGIC RHINITIS 09/07/2007   GERD 09/07/2007   Primary osteoarthritis of right knee 09/07/2007   Osteopenia 09/07/2007   COLONIC POLYPS, HX OF 09/07/2007    Current Outpatient Medications on File Prior to Visit  Medication Sig Dispense Refill   albuterol (VENTOLIN HFA) 108 (90 Base) MCG/ACT inhaler INHALE 1 PUFF BY MOUTH EVERY 6 HOURS AS NEEDED FOR SHORTNESS OF BREATH AND FOR WHEEZING     alclomethasone (ACLOVATE) 0.05 % cream Apply 1 application topically as needed.      amLODipine (NORVASC) 10 MG tablet TAKE ONE TABLET BY MOUTH ONCE DAILY 90 tablet 1   aspirin 325 MG tablet daily.     aspirin EC 81 MG tablet Take 81 mg by mouth daily.     Calcium Carb-Cholecalciferol (CALCIUM + D3) 600-800 MG-UNIT TABS Take 1 tablet by mouth 2 (two) times daily.     clopidogrel (PLAVIX) 75 MG tablet daily.     Coenzyme Q10 (CO Q10) 30 MG CAPS Take 1 capsule by mouth daily.     CONSTULOSE 10 GM/15ML solution TAKE 30 ML BY MOUTH TWICE DAILY AS  NEEDED     cyclobenzaprine (FLEXERIL) 10 MG tablet Take 10 mg by mouth at bedtime as needed.     DULoxetine (CYMBALTA) 30 MG capsule Take by mouth daily.     ergocalciferol (VITAMIN D2) 1.25 MG (50000 UT) capsule 1 po weekly     escitalopram (LEXAPRO) 5 MG tablet one po am daily as directed     EUTHYROX 100 MCG tablet Take 100 mcg by mouth every morning.     EUTHYROX 125 MCG tablet Take 125 mcg by mouth daily.     ezetimibe (ZETIA) 10 MG tablet Take 10 mg by mouth daily.     fluticasone (FLONASE) 50 MCG/ACT nasal spray USE 1 TO  2 SPRAY(S) IN EACH NOSTRIL ONCE DAILY AS NEEDED FOR 30 DAYS     gabapentin (NEURONTIN) 100 MG capsule Take 1 capsule (100 mg total) by mouth 3 (three) times daily. Take 1 pill in AM- and 2 pills at night. - for nerve pain 90 capsule 5   hydrOXYzine (ATARAX/VISTARIL) 25 MG tablet Take 25 mg by mouth 3 (three) times daily.      Lancets (ONETOUCH DELICA PLUS [id number]) MISC 1 each by Other route 2 (two) times daily.     levothyroxine (SYNTHROID, LEVOTHROID) 150 MCG tablet Take 150 mcg by mouth daily before breakfast.     meloxicam (MOBIC) 7.5 MG tablet Take 7.5 mg by mouth daily.     Multiple Vitamin (MULTIVITAMIN WITH MINERALS) TABS tablet Take 1 tablet by mouth every morning. Centrum     nystatin cream (MYCOSTATIN) Apply topically.     ONETOUCH VERIO test strip 1 each by Other route 2 (two) times daily.     pantoprazole (PROTONIX) 40 MG tablet Take 1 tablet (40 mg total) by mouth daily. 30 tablet 6   Propylene Glycol 0.6 % SOLN Place 1 drop into both eyes every morning.     rosuvastatin (CRESTOR) 40 MG tablet Take 1 tablet (40 mg total) by mouth daily. 90 tablet 3   triamcinolone ointment (KENALOG) 0.1 %      Vitamin D, Cholecalciferol, 25 MCG (1000 UT) CAPS Take 1 capsule by mouth daily.     Vitamin D, Ergocalciferol, (DRISDOL) 1.25 MG (50000 UNIT) CAPS capsule Take 50,000 Units by mouth once a week.     Current Facility-Administered Medications on File Prior to Visit  Medication Dose Route Frequency Provider Last Rate Last Admin   0.9 %  sodium chloride infusion  500 mL Intravenous Once Milus Banister, MD       tranexamic acid (CYKLOKAPRON) 2,000 mg in sodium chloride 0.9 % 50 mL Topical Application  0,938 mg Topical Once Porterfield, Amber, PA-C        Allergies  Allergen Reactions   Atorvastatin Other (See Comments)    Muscle aches Other reaction(s): Other (See Comments) Muscle aches    Review of Systems No fevers, chills, nausea, muscle aches, no difficulty breathing, no calf  pain, no chest pain or shortness of breath.   Physical Exam  GENERAL APPEARANCE: Alert, conversant. Appropriately groomed. No acute distress.   VASCULAR: Pedal pulses faintly palpable DP and non palpable PT bilateral.  Capillary refill time is immediate to all digits,  Proximal to distal cooling it warm to warm.  Digital hair growth is absent.   NEUROLOGIC: sensation is intact to 5.07 monofilament at 5/5 sites bilateral.  Light touch is intact bilateral, vibratory sensation intact bilateral  MUSCULOSKELETAL: acceptable muscle strength, tone and stability bilateral.  Semi rigid contracture of the  right second and fourth digits with an overlying hyperkeratotic lesion on the right fourth digit at the pip joint.  Right hallux is rectus and has had previous surgery.  Right fifth digit is shortened from a prior hammertoe surgery.   DERMATOLOGIC: skin is warm, supple, and dry.  Superficial excoriation is present on the dorsal aspect of the right hallux- likely from a scratch-  the area appears to be healing well.   The right fourth toe has a large hyperkeratotic lesion overlying the proximal interphalangeal joint.  There is a enucleated core with intact integument post debridement.    Assessment   1. Acquired hammertoe of right foot   2. Corn      Plan  Discussed exam findings with the [name] and recommended paring down the lesion.  The [name] agreed and after a short soak, her lesion was pared with a 15 blade.  I discussed that since the hammertoe is rigid,  the lesion will likely return in the future.  Recommended padding and a silicone pad was dispensed for her use.  I recommended she keep a close eye on the excoriation on the right great toe and to call if it gets red, swollen, painful or starts to drain. She will use neosporin on the area until it is healed.

## 2021-05-05 ENCOUNTER — Encounter: Payer: Self-pay | Admitting: Thoracic Surgery (Cardiothoracic Vascular Surgery)

## 2021-05-05 ENCOUNTER — Other Ambulatory Visit: Payer: Self-pay

## 2021-05-05 ENCOUNTER — Ambulatory Visit (INDEPENDENT_AMBULATORY_CARE_PROVIDER_SITE_OTHER): Payer: Medicare Other | Admitting: Thoracic Surgery (Cardiothoracic Vascular Surgery)

## 2021-05-05 VITALS — BP 160/78 | HR 85 | Resp 20 | Ht 61.0 in | Wt 138.0 lb

## 2021-05-05 DIAGNOSIS — R911 Solitary pulmonary nodule: Secondary | ICD-10-CM | POA: Diagnosis not present

## 2021-05-05 NOTE — Progress Notes (Signed)
[address]       [place] [address]             [phone]       HPI: [name] returns to discuss findings of her recent CT scan.  [name] is a 75 year old woman with a history of tobacco abuse, COPD, lung nodules, CAD, CABG in 2012, hypertension, and gastroesophageal reflux.  She had a CT of the cervical spine in 2011.  There was a possible right apical mass.  CT of the chest showed bilateral apical pleural-parenchymal densities.  These were not hypermetabolic on PET.  She had multiple other lung nodules that we have been following.  I last saw her in January 2022.  A posterior right upper lobe lung nodule that had possibly grown slightly on the previous scan with stable.  She now returns for 60-month follow-up.  She says she has not been feeling well lately.  She complains that she just does not have much energy and has been feeling "down."  Occasional chest pain and shortness of breath, but not concerning to her.  Past Medical History:  Diagnosis Date   Anemia    Anxiety    hx   Arthritis    "my whole body"   Atrial septal aneurysm    COLONIC POLYPS, HX OF    COPD (chronic obstructive pulmonary disease) ([facility])    no per pt on 03/21/2014 & 02/06/2018   Coronary artery disease    a. s/p CABG 2012.   DISC DISEASE, CERVICAL    Gastroesophageal reflux disease    Headache    "I have headaches all the time" (02/06/2018)   Heart murmur    as a child   History of rheumatic fever    Hyperlipidemia    Hypertension    Hypothyroidism    Insomnia    Osteoarthritis    OSTEOPENIA    PAD (peripheral artery disease) ([place])    a. PTA to R SFA 2015. b. PTA to L SFA 05/2016.   Plantar fasciitis of left foot 10/02/2019   Pre-diabetes    RSD (reflex sympathetic dystrophy) 10/31/2011   Vitamin D deficiency     Current Outpatient Medications  Medication Sig Dispense Refill   albuterol (VENTOLIN HFA) 108 (90 Base) MCG/ACT inhaler INHALE 1 PUFF BY MOUTH  EVERY 6 HOURS AS NEEDED FOR SHORTNESS OF BREATH AND FOR WHEEZING     alclomethasone (ACLOVATE) 0.05 % cream Apply 1 application topically as needed.      amLODipine (NORVASC) 10 MG tablet TAKE ONE TABLET BY MOUTH ONCE DAILY 90 tablet 1   aspirin EC 81 MG tablet Take 81 mg by mouth daily.     Calcium Carb-Cholecalciferol (CALCIUM + D3) 600-800 MG-UNIT TABS Take 1 tablet by mouth 2 (two) times daily.     clopidogrel (PLAVIX) 75 MG tablet daily.     Coenzyme Q10 (CO Q10) 30 MG CAPS Take 1 capsule by mouth daily.     CONSTULOSE 10 GM/15ML solution TAKE 30 ML BY MOUTH TWICE DAILY AS NEEDED     cyclobenzaprine (FLEXERIL) 10 MG tablet Take 10 mg by mouth at bedtime as needed.     DULoxetine (CYMBALTA) 30 MG capsule Take by mouth daily.     ergocalciferol (VITAMIN D2) 1.25 MG (50000 UT) capsule 1 po weekly     escitalopram (LEXAPRO) 5 MG tablet one po am daily as directed     EUTHYROX 125 MCG tablet Take 125 mcg  by mouth daily.     ezetimibe (ZETIA) 10 MG tablet Take 10 mg by mouth daily.     fluticasone (FLONASE) 50 MCG/ACT nasal spray USE 1 TO 2 SPRAY(S) IN EACH NOSTRIL ONCE DAILY AS NEEDED FOR 30 DAYS     gabapentin (NEURONTIN) 100 MG capsule Take 1 capsule (100 mg total) by mouth 3 (three) times daily. Take 1 pill in AM- and 2 pills at night. - for nerve pain 90 capsule 5   hydrOXYzine (ATARAX/VISTARIL) 25 MG tablet Take 25 mg by mouth 3 (three) times daily.      Lancets (ONETOUCH DELICA PLUS [id number]) MISC 1 each by Other route 2 (two) times daily.     levothyroxine (SYNTHROID, LEVOTHROID) 150 MCG tablet Take 150 mcg by mouth daily before breakfast.     meloxicam (MOBIC) 7.5 MG tablet Take 7.5 mg by mouth daily.     Multiple Vitamin (MULTIVITAMIN WITH MINERALS) TABS tablet Take 1 tablet by mouth every morning. Centrum     nystatin cream (MYCOSTATIN) Apply topically.     ONETOUCH VERIO test strip 1 each by Other route 2 (two) times daily.     pantoprazole (PROTONIX) 40 MG tablet Take 1 tablet (40  mg total) by mouth daily. 30 tablet 6   Propylene Glycol 0.6 % SOLN Place 1 drop into both eyes every morning.     rosuvastatin (CRESTOR) 40 MG tablet Take 1 tablet (40 mg total) by mouth daily. 90 tablet 3   triamcinolone ointment (KENALOG) 0.1 %      Vitamin D, Cholecalciferol, 25 MCG (1000 UT) CAPS Take 1 capsule by mouth daily.     aspirin 325 MG tablet daily. (Patient not taking: Reported on 05/05/2021)     Vitamin D, Ergocalciferol, (DRISDOL) 1.25 MG (50000 UNIT) CAPS capsule Take 50,000 Units by mouth once a week. (Patient not taking: Reported on 05/05/2021)     Current Facility-Administered Medications  Medication Dose Route Frequency Provider Last Rate Last Admin   0.9 %  sodium chloride infusion  500 mL Intravenous Once Milus Banister, MD       Facility-Administered Medications Ordered in Other Visits  Medication Dose Route Frequency Provider Last Rate Last Admin   tranexamic acid (CYKLOKAPRON) 2,000 mg in sodium chloride 0.9 % 50 mL Topical Application  8,416 mg Topical Once Porterfield, [employer], PA-C        Physical Exam BP (!) 160/78   Pulse 85   Resp 20   Ht 5\' 1"  (1.549 m)   Wt 138 lb (62.6 kg)   SpO2 95% Comment: RA  BMI 26.31 kg/m  75 year old woman in no acute distress Alert and oriented x3 with no focal deficits Lungs clear bilaterally Cardiac regular rate and rhythm  Diagnostic Tests: CT CHEST WITHOUT CONTRAST   TECHNIQUE: Multidetector CT imaging of the chest was performed following the standard protocol without IV contrast.   COMPARISON:  08/21/2020   FINDINGS: Cardiovascular: The heart is normal in size. No pericardial effusion.   No evidence of thoracic aortic aneurysm. Atherosclerotic calcifications of the aortic arch.   Three vessel coronary atherosclerosis. Postsurgical changes related to prior CABG.   Mediastinum/Nodes: No suspicious mediastinal lymphadenopathy.   Status post thyroidectomy.   Lungs/Pleura: Biapical pleural-parenchymal  scarring.   Moderate centrilobular and paraseptal emphysematous changes, upper lung predominant.   No focal consolidation.   9 x 5 mm irregular nodule in the posterior right upper lobe (series 8/image 47), previously 8 x 3 mm when remeasured in a similar  fashion.   Additional scattered small right lung nodules are similar, measuring up to 4 mm in the right lower lobe (series 8/image 108).   No pleural effusion or pneumothorax.   Upper Abdomen: Visualized upper abdomen is notable for scattered hepatic cysts and vascular calcifications.   Musculoskeletal: Mild degenerative changes of the visualized thoracolumbar spine. Median sternotomy.   IMPRESSION: 9 x 5 mm irregular nodule in the posterior right upper lobe, mildly progressive. Given continued slow progression, this is concerning for indolent neoplasm including primary bronchogenic neoplasm. However, this remains beneath the size threshold for PET sensitivity. Consider follow-up CT chest in 6 months.   Aortic Atherosclerosis (ICD10-I70.0) and Emphysema (ICD10-J43.9).     Electronically Signed   By: Julian Hy M.D.   On: 03/06/2021 23:50 I personally reviewed the CT images.  There has been a slight interval increase in size of the posterior right upper lobe nodule, but not much difference when compared to the scan from a year ago.  Impression: [name] is a 75 year old woman with a history of tobacco abuse, COPD, lung nodules, CAD, CABG in 2012, hypertension, and gastroesophageal reflux.  Lung nodules-she has a bilobed right upper lobe lung nodule that has definitely grown over the past 2-1/2 years.  It tends to wax and wane from scan to scan.  It appears to be about a millimeter larger than it was 6 months ago but not much different than it was a year ago.  Obviously this is concerning particularly given her smoking history.  However I do not think there is any indication to aggressively try to biopsy the nodule  at this point in time.  I recommended we repeat a scan in 6 months.  Tobacco abuse-quit smoking in 2011.  CAD-status post CABG in 2012.  Has occasional chest discomfort and shortness of breath but no recent change.  Plan: Repeat CT in 6 months. If nodule enlarged we will consider biopsy versus resection.  Melrose Nakayama, MD [facility] [phone]

## 2021-05-15 ENCOUNTER — Telehealth: Payer: Self-pay | Admitting: Podiatry

## 2021-05-15 NOTE — Telephone Encounter (Signed)
Pt left message yesterday asking about diabetic shoe status.  I returned call and we have the shoes just waiting on the inserts and it looks like they should be shipping in the next week or 2 and I will call when they come in to schedule an appt to pick them up.

## 2021-06-01 ENCOUNTER — Telehealth: Payer: Self-pay | Admitting: Podiatry

## 2021-06-01 NOTE — Telephone Encounter (Signed)
Diabetic shoes in.. lvm for pt to call to discuss if she wanted an appt for 10.13 or to just pick up at her appt on 10.17 with Dr Elisha Ponder.

## 2021-06-08 ENCOUNTER — Ambulatory Visit: Payer: Medicare Other | Admitting: Podiatry

## 2021-06-10 ENCOUNTER — Ambulatory Visit: Payer: Medicare Other | Admitting: Podiatry

## 2021-06-10 ENCOUNTER — Encounter: Payer: Self-pay | Admitting: Podiatry

## 2021-06-10 ENCOUNTER — Other Ambulatory Visit: Payer: Self-pay

## 2021-06-10 DIAGNOSIS — M2011 Hallux valgus (acquired), right foot: Secondary | ICD-10-CM

## 2021-06-10 DIAGNOSIS — L84 Corns and callosities: Secondary | ICD-10-CM

## 2021-06-10 DIAGNOSIS — E1151 Type 2 diabetes mellitus with diabetic peripheral angiopathy without gangrene: Secondary | ICD-10-CM

## 2021-06-10 DIAGNOSIS — M2042 Other hammer toe(s) (acquired), left foot: Secondary | ICD-10-CM

## 2021-06-10 DIAGNOSIS — M2041 Other hammer toe(s) (acquired), right foot: Secondary | ICD-10-CM

## 2021-06-10 DIAGNOSIS — B351 Tinea unguium: Secondary | ICD-10-CM

## 2021-06-10 DIAGNOSIS — M79676 Pain in unspecified toe(s): Secondary | ICD-10-CM | POA: Diagnosis not present

## 2021-06-10 DIAGNOSIS — M2012 Hallux valgus (acquired), left foot: Secondary | ICD-10-CM

## 2021-06-16 NOTE — Progress Notes (Signed)
  Subjective:  Patient ID: [name], female    DOB: 06/25/1946,  MRN: [id number]  TAMERIA PATTI presents to clinic today for callus(es) right foot and painful thick toenails that are difficult to trim. Painful toenails interfere with ambulation. Aggravating factors include wearing enclosed shoe gear. Pain is relieved with periodic professional debridement. Painful calluses are aggravated when weightbearing with and without shoegear. Pain is relieved with periodic professional debridement.  She is seen for at risk foot care with h/o NIDDM with PAD.  Patient states she did check blood glucose today but forgot what her reading was.    PCP is [name], FNP.  Allergies  Allergen Reactions   Atorvastatin Other (See Comments)    Muscle aches Other reaction(s): Other (See Comments) Muscle aches    Review of Systems: Negative except as noted in the HPI. Objective:   Constitutional KIASHA BELLIN is a pleasant 75 y.o. African American female, WD, WN in NAD. AAO x 3.   Vascular Capillary refill time to digits immediate b/l. Faintly palpable pedal pulses b/l. Pedal hair sparse. Lower extremity skin temperature gradient within normal limits. No pain with calf compression b/l. No edema noted b/l lower extremities. No cyanosis or clubbing noted.  Neurologic Normal speech. Oriented to person, place, and time. Protective sensation intact 5/5 intact bilaterally with 10g monofilament b/l.  Dermatologic Skin warm and supple b/l lower extremities. Toenails 1-5 bilaterally elongated, discolored, dystrophic, thickened, and crumbly with subungual debris and tenderness to dorsal palpation. Hyperkeratotic lesion(s) R hallux.  No erythema, no edema, no drainage, no fluctuance.  Orthopedic: Normal muscle strength 5/5 to all lower extremity muscle groups bilaterally. Hammertoe(s) noted to the 2-5 bilaterally.   Radiographs: None Assessment:   1. Pain due to onychomycosis of toenail   2.  Callus   3. Type II diabetes mellitus with peripheral circulatory disorder [facility])    Plan:  Patient was evaluated and treated and all questions answered. Consent given for treatment as described below: -Continue diabetic foot care principles: inspect feet daily, monitor glucose as recommended by PCP and/or Endocrinologist, and follow prescribed diet per PCP, Endocrinologist and/or dietician. -Patient to continue soft, supportive shoe gear daily. -Mycotic toenails were debrided in length and girth with sterile nail nippers and dremel without iatrogenic bleeding. -Callus(es) R hallux pared utilizing sterile scalpel blade without complication or incident. Total number debrided =1. -Patient to report any pedal injuries to medical professional immediately. -Patient/POA to call should there be question/concern in the interim.  Return in about 3 months (around 09/10/2021).  Marzetta Board, DPM

## 2021-07-07 ENCOUNTER — Encounter: Payer: Self-pay | Admitting: Cardiology

## 2021-07-07 ENCOUNTER — Other Ambulatory Visit: Payer: Self-pay

## 2021-07-07 ENCOUNTER — Ambulatory Visit: Payer: Medicare Other | Admitting: Cardiology

## 2021-07-07 VITALS — BP 138/74 | HR 77 | Ht 63.0 in | Wt 133.0 lb

## 2021-07-07 DIAGNOSIS — I10 Essential (primary) hypertension: Secondary | ICD-10-CM | POA: Diagnosis not present

## 2021-07-07 DIAGNOSIS — I739 Peripheral vascular disease, unspecified: Secondary | ICD-10-CM

## 2021-07-07 DIAGNOSIS — I2510 Atherosclerotic heart disease of native coronary artery without angina pectoris: Secondary | ICD-10-CM

## 2021-07-07 DIAGNOSIS — E7800 Pure hypercholesterolemia, unspecified: Secondary | ICD-10-CM | POA: Diagnosis not present

## 2021-07-07 LAB — COMPREHENSIVE METABOLIC PANEL
ALT: 15 IU/L (ref 0–32)
AST: 19 IU/L (ref 0–40)
Albumin/Globulin Ratio: 1.9 (ref 1.2–2.2)
Albumin: 4.4 g/dL (ref 3.7–4.7)
Alkaline Phosphatase: 120 IU/L (ref 44–121)
BUN/Creatinine Ratio: 14 (ref 12–28)
BUN: 9 mg/dL (ref 8–27)
Bilirubin Total: 0.5 mg/dL (ref 0.0–1.2)
CO2: 25 mmol/L (ref 20–29)
Calcium: 9.5 mg/dL (ref 8.7–10.3)
Chloride: 104 mmol/L (ref 96–106)
Creatinine, Ser: 0.65 mg/dL (ref 0.57–1.00)
Globulin, Total: 2.3 g/dL (ref 1.5–4.5)
Glucose: 104 mg/dL — ABNORMAL HIGH (ref 70–99)
Potassium: 3.7 mmol/L (ref 3.5–5.2)
Sodium: 142 mmol/L (ref 134–144)
Total Protein: 6.7 g/dL (ref 6.0–8.5)
eGFR: 91 mL/min/{1.73_m2} (ref >59)

## 2021-07-07 LAB — LIPID PANEL
Chol/HDL Ratio: 3.5 ratio (ref 0.0–4.4)
Cholesterol, Total: 218 mg/dL — ABNORMAL HIGH (ref 100–199)
HDL: 63 mg/dL (ref >39)
LDL Chol Calc (NIH): 128 mg/dL — ABNORMAL HIGH (ref 0–99)
Triglycerides: 156 mg/dL — ABNORMAL HIGH (ref 0–149)
VLDL Cholesterol Cal: 27 mg/dL (ref 5–40)

## 2021-07-07 NOTE — Patient Instructions (Signed)

## 2021-07-07 NOTE — Progress Notes (Signed)
HPI: FU coronary artery disease. S/P CABG March 2012 with a LIMA to the LAD, a sequential saphenous vein graft to the diagonal and obtuse marginal and a saphenous vein graft to the acute marginal. Fu echo in August of 2012 showed an EF of 50-55, atrial septal aneurysm. Patient followed by Dr. Gwenlyn Found for peripheral vascular disease. Multiple interventions previously. Had atherectomy of distal right SFA June 2019. Carotid Dopplers July 2019 showed 1 to 39% bilateral stenosis. ABIs December 2021 showed mild right lower extremity arterial disease and moderate left extremity arterial disease. Nuclear study March 2022 showed ejection fraction 68% and no ischemia or infarction.  Since last seen, she denies increased dyspnea, chest pain, palpitations or syncope.  Current Outpatient Medications  Medication Sig Dispense Refill   albuterol (VENTOLIN HFA) 108 (90 Base) MCG/ACT inhaler INHALE 1 PUFF BY MOUTH EVERY 6 HOURS AS NEEDED FOR SHORTNESS OF BREATH AND FOR WHEEZING     alclomethasone (ACLOVATE) 0.05 % cream Apply 1 application topically as needed.      amLODipine (NORVASC) 10 MG tablet TAKE ONE TABLET BY MOUTH ONCE DAILY 90 tablet 1   aspirin EC 81 MG tablet Take 81 mg by mouth daily.     BINAXNOW COVID-19 AG HOME TEST KIT Use as Directed on the Package     Calcium Carb-Cholecalciferol (CALCIUM + D3) 600-800 MG-UNIT TABS Take 1 tablet by mouth 2 (two) times daily.     clopidogrel (PLAVIX) 75 MG tablet daily.     Coenzyme Q10 (CO Q10) 30 MG CAPS Take 1 capsule by mouth daily.     CONSTULOSE 10 GM/15ML solution TAKE 30 ML BY MOUTH TWICE DAILY AS NEEDED     cyclobenzaprine (FLEXERIL) 10 MG tablet Take 10 mg by mouth at bedtime as needed.     DULoxetine (CYMBALTA) 30 MG capsule Take by mouth daily.     ergocalciferol (VITAMIN D2) 1.25 MG (50000 UT) capsule 1 po weekly     escitalopram (LEXAPRO) 5 MG tablet one po am daily as directed     EUTHYROX 100 MCG tablet Take 100 mcg by mouth every morning.      ezetimibe (ZETIA) 10 MG tablet Take 10 mg by mouth daily.     fluticasone (FLONASE) 50 MCG/ACT nasal spray USE 1 TO 2 SPRAY(S) IN EACH NOSTRIL ONCE DAILY AS NEEDED FOR 30 DAYS     gabapentin (NEURONTIN) 100 MG capsule Take 1 capsule (100 mg total) by mouth 3 (three) times daily. Take 1 pill in AM- and 2 pills at night. - for nerve pain 90 capsule 5   hydrOXYzine (ATARAX/VISTARIL) 25 MG tablet Take 25 mg by mouth 3 (three) times daily.      Lancets (ONETOUCH DELICA PLUS [id number]) MISC 1 each by Other route 2 (two) times daily.     levothyroxine (SYNTHROID, LEVOTHROID) 150 MCG tablet Take 150 mcg by mouth daily before breakfast.     meloxicam (MOBIC) 7.5 MG tablet Take 7.5 mg by mouth daily.     Multiple Vitamin (MULTIVITAMIN WITH MINERALS) TABS tablet Take 1 tablet by mouth every morning. Centrum     nystatin cream (MYCOSTATIN) Apply topically.     ONETOUCH VERIO test strip 1 each by Other route 2 (two) times daily.     pantoprazole (PROTONIX) 40 MG tablet Take 1 tablet (40 mg total) by mouth daily. 30 tablet 6   Propylene Glycol 0.6 % SOLN Place 1 drop into both eyes every morning.     rosuvastatin (  CRESTOR) 40 MG tablet Take 1 tablet (40 mg total) by mouth daily. 90 tablet 3   triamcinolone ointment (KENALOG) 0.1 %      Vitamin D, Cholecalciferol, 25 MCG (1000 UT) CAPS Take 1 capsule by mouth daily.     Vitamin D, Ergocalciferol, (DRISDOL) 1.25 MG (50000 UNIT) CAPS capsule Take 50,000 Units by mouth once a week.     Current Facility-Administered Medications  Medication Dose Route Frequency Provider Last Rate Last Admin   0.9 %  sodium chloride infusion  500 mL Intravenous Once Milus Banister, MD       Facility-Administered Medications Ordered in Other Visits  Medication Dose Route Frequency Provider Last Rate Last Admin   tranexamic acid (CYKLOKAPRON) 2,000 mg in sodium chloride 0.9 % 50 mL Topical Application  9,211 mg Topical Once [place], [employer], PA-C         Past Medical  History:  Diagnosis Date   Anemia    Anxiety    hx   Arthritis    "my whole body"   Atrial septal aneurysm    COLONIC POLYPS, HX OF    COPD (chronic obstructive pulmonary disease) (HCC)    no per pt on 03/21/2014 & 02/06/2018   Coronary artery disease    a. s/p CABG 2012.   DISC DISEASE, CERVICAL    Gastroesophageal reflux disease    Headache    "I have headaches all the time" (02/06/2018)   Heart murmur    as a child   History of rheumatic fever    Hyperlipidemia    Hypertension    Hypothyroidism    Insomnia    Osteoarthritis    OSTEOPENIA    PAD (peripheral artery disease) ([place])    a. PTA to R SFA 2015. b. PTA to L SFA 05/2016.   Plantar fasciitis of left foot 10/02/2019   Pre-diabetes    RSD (reflex sympathetic dystrophy) 10/31/2011   Vitamin D deficiency     Past Surgical History:  Procedure Laterality Date   ABDOMINAL HYSTERECTOMY  1977   "partial"   BALLOON ANGIOPLASTY, ARTERY Right 03/21/2014   SFA   CARDIAC CATHETERIZATION  2011    CARPAL TUNNEL RELEASE Right 2005   CATARACT EXTRACTION Left 2013   CORONARY ARTERY BYPASS GRAFT  10/2009   LIMA to the LAD, saphenous vein graft to the acute marginal, saphenous vein graft to the diagonal and obtuse marginal.   DILATION AND CURETTAGE OF UTERUS  1978   JOINT REPLACEMENT     LOWER EXTREMITY ANGIOGRAM N/A 03/21/2014   Procedure: LOWER EXTREMITY ANGIOGRAM;  Surgeon: Lorretta Harp, MD;  Location: [facility] CATH LAB;  Service: Cardiovascular;  Laterality: N/A;   LOWER EXTREMITY ANGIOGRAM  06/07/2016   Abdominal aortogram/bilateral iliac angiogram/bifemoral runoff   LOWER EXTREMITY ANGIOGRAPHY N/A 02/06/2018   Procedure: LOWER EXTREMITY ANGIOGRAPHY;  Surgeon: Lorretta Harp, MD;  Location: [place] CV LAB;  Service: Cardiovascular;  Laterality: N/A;   PERIPHERAL VASCULAR ATHERECTOMY  02/06/2018   Procedure: PERIPHERAL VASCULAR ATHERECTOMY;  Surgeon: Lorretta Harp, MD;  Location: [place] CV LAB;  Service:  Cardiovascular;;   PERIPHERAL VASCULAR CATHETERIZATION Bilateral 06/07/2016   Procedure: Lower Extremity Angiography;  Surgeon: Lorretta Harp, MD;  Location: [place] CV LAB;  Service: Cardiovascular;  Laterality: Bilateral;   PERIPHERAL VASCULAR CATHETERIZATION Left 06/07/2016   Procedure: Peripheral Vascular Atherectomy;  Surgeon: Lorretta Harp, MD;  Location: [place] CV LAB;  Service: Cardiovascular;  Laterality: Left;  SFA   PERIPHERAL  VASCULAR CATHETERIZATION Left 06/07/2016   Procedure: Peripheral Vascular Balloon Angioplasty;  Surgeon: Lorretta Harp, MD;  Location: [place] CV LAB;  Service: Cardiovascular;  Laterality: Left;  SFA   THYROIDECTOMY  1995   TONSILLECTOMY     TOTAL KNEE ARTHROPLASTY Right 06/18/2014   Procedure: RIGHT TOTAL KNEE ARTHROPLASTY;  Surgeon: Tobi Bastos, MD;  Location: [facility] ORS;  Service: Orthopedics;  Laterality: Right;    Social History   Socioeconomic History   Marital status: Divorced    Spouse name: Not on file   Number of children: 2   Years of education: 14   Highest education level: Not on file  Occupational History   Occupation: Retired    Fish farm manager: RETIRED  Tobacco Use   Smoking status: Former    Packs/day: 0.80    Years: 45.00    Pack years: 36.00    Types: Cigarettes    Quit date: 08/23/2009    Years since quitting: 11.8   Smokeless tobacco: Never  Vaping Use   Vaping Use: Never used  Substance and Sexual Activity   Alcohol use: Yes    Alcohol/week: 2.0 standard drinks    Types: 2 Glasses of wine per week   Drug use: Never   Sexual activity: Not Currently  Other Topics Concern   Not on file  Social History Narrative   Not on file   Social Determinants of Health   Financial Resource Strain: Not on file  Food Insecurity: Not on file  Transportation Needs: Not on file  Physical Activity: Not on file  Stress: Not on file  Social Connections: Not on file  Intimate Partner Violence: Not on file    Family  History  Problem Relation Age of Onset   Arthritis Maternal Aunt    Heart disease Maternal Aunt    Hypertension Maternal Aunt    Diabetes Maternal Aunt    Colon cancer Neg Hx    Kidney disease Neg Hx    Liver disease Neg Hx    Throat cancer Neg Hx    Stomach cancer Neg Hx    Colon polyps Neg Hx     ROS: Occasional pain in right knee but no fevers or chills, productive cough, hemoptysis, dysphasia, odynophagia, melena, hematochezia, dysuria, hematuria, rash, seizure activity, orthopnea, PND, pedal edema, claudication. Remaining systems are negative.  Physical Exam: Well-developed well-nourished in no acute distress.  Skin is warm and dry.  HEENT is normal.  Neck is supple.  Chest is clear to auscultation with normal expansion.  Cardiovascular exam is regular rate and rhythm.  Abdominal exam nontender or distended. No masses palpated. Extremities show no edema. neuro grossly intact  ECG-normal sinus rhythm at a rate of 77, poor R wave progression.  [place] reviewed  A/P  1 coronary artery disease-patient denies chest pain.  Continue aspirin and statin.  Recent nuclear study showed no ischemia.  2 hypertension-patient's blood pressure is controlled.  Continue present medications.  Check potassium and renal function.  3 hyperlipidemia-continue statin.  Check lipids and liver.  4 peripheral vascular disease-patient denies claudication.  Continue medical therapy.  5 carotid artery disease-mild on most recent Dopplers.  6 history of lung nodule-patient is followed by Dr. Roxan Hockey.  Kirk Ruths, MD

## 2021-07-08 ENCOUNTER — Encounter: Payer: Self-pay | Admitting: *Deleted

## 2021-07-22 ENCOUNTER — Telehealth: Payer: Self-pay | Admitting: Cardiovascular Disease

## 2021-07-22 NOTE — Telephone Encounter (Signed)
Called patient, she states that she has had increased cramping in both of her legs. She states that the right is worse than the left. Patient denies redness and the only pain she has is when she is having cramps, and states that the cramping occurs even when walking, at night and it is just worsening.  Patient does verify that she is taking her rosuvastatin and zetia. Patient is concerned and was questioning if scans of her legs should be done. I advised I would route to MD to get advice.  Patient verbalized understanding.

## 2021-07-22 NOTE — Telephone Encounter (Signed)
Patient states she is having a lot of cramps in her ankles up her legs. She states they also feel heavy at times. She would like to know if she needs another ultrasound on her legs.

## 2021-07-23 NOTE — Telephone Encounter (Signed)
Called patient, advised of recommendations from MD.  Patient verbalized understanding, she will let us know how she is after 1 month.

## 2021-07-27 ENCOUNTER — Encounter (HOSPITAL_COMMUNITY): Payer: Self-pay | Admitting: Emergency Medicine

## 2021-07-27 ENCOUNTER — Other Ambulatory Visit: Payer: Self-pay

## 2021-07-27 ENCOUNTER — Ambulatory Visit (INDEPENDENT_AMBULATORY_CARE_PROVIDER_SITE_OTHER): Payer: Medicare Other

## 2021-07-27 ENCOUNTER — Ambulatory Visit (HOSPITAL_COMMUNITY)
Admission: EM | Admit: 2021-07-27 | Discharge: 2021-07-27 | Disposition: A | Discharge status: Home / Self Care | Payer: Medicare Other | Attending: Internal Medicine | Admitting: Internal Medicine

## 2021-07-27 DIAGNOSIS — R0789 Other chest pain: Secondary | ICD-10-CM

## 2021-07-27 DIAGNOSIS — R079 Chest pain, unspecified: Secondary | ICD-10-CM | POA: Diagnosis not present

## 2021-07-27 MED ORDER — PANTOPRAZOLE SODIUM 40 MG PO TBEC: 40.0000 mg | DELAYED_RELEASE_TABLET | Freq: Two times a day (BID) | ORAL | 6 refills | Status: DC

## 2021-07-27 NOTE — Discharge Instructions (Addendum)
Please take medications as prescribed I increased Protonix to 40 mg twice daily Tylenol as needed for pain Heating pad use on a 20-minute on-20 minutes of cycle will help Gentle stretches will help as well. Return to urgent care if symptoms worsen

## 2021-07-27 NOTE — ED Provider Notes (Signed)
[facility]    CSN: [id number] Arrival date & time: 07/27/21  2035      History   Chief Complaint Chief Complaint  Patient presents with   Back Pain    HPI [name] is a 75 y.o. female to urgent care with 3-day history of central chest pain radiating to the right side of the chest into the back.  Patient's symptoms started insidiously and has been persistent.  Pain is of moderate severity, intermittent with no aggravating or relieving factors.  Pain started and resolved spontaneously.  It is not associated with food.  Patient denies any shortness of breath or wheezing.  Pain does not radiate to the left shoulder.  No orthopnea or paroxysmal nocturnal dyspnea.  No lower extremity swelling.  No trauma to the chest or back.  No heavy lifting.  No weight loss. HPI  Past Medical History:  Diagnosis Date   Anemia    Anxiety    hx   Arthritis    "my whole body"   Atrial septal aneurysm    COLONIC POLYPS, HX OF    COPD (chronic obstructive pulmonary disease) (HCC)    no per pt on 03/21/2014 & 02/06/2018   Coronary artery disease    a. s/p CABG 2012.   DISC DISEASE, CERVICAL    Gastroesophageal reflux disease    Headache    "I have headaches all the time" (02/06/2018)   Heart murmur    as a child   History of rheumatic fever    Hyperlipidemia    Hypertension    Hypothyroidism    Insomnia    Osteoarthritis    OSTEOPENIA    PAD (peripheral artery disease) ([place])    a. PTA to R SFA 2015. b. PTA to L SFA 05/2016.   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   Plantar fasciitis of left foot 10/02/2019   Status post  partial hysterectomy 10/24/2018   Discharge from the vagina 10/24/2018   Claudication in peripheral vascular disease ([place]) 02/06/2018   Chronic idiopathic constipation 10/07/2017   CAD in native artery 06/08/2016   Skin inflammation 04/06/2016   Contracture of hand joint 04/06/2016   Microhematuria 11/12/2015   Left shoulder pain 10/15/2015   Trapezius muscle spasm 09/24/2015   Type 2 diabetes mellitus ([place]) 08/07/2015   Urinary frequency 04/04/2015   External hemorrhoid 11/02/2014   Left-sided thoracic back pain 10/03/2014   Gingivitis 10/03/2014   Routine general medical examination at a health care facility 09/10/2014   Total knee replacement status 06/18/2014   Tooth pain 04/18/2014   Claudication ([place]) 03/21/2014   Primary localized osteoarthrosis, lower leg 08/10/2013   Right knee pain 08/02/2013   Upper respiratory infection 08/02/2013   Dyspepsia 11/16/2012   Knee pain, right 02/18/2012   PAD (peripheral artery disease) ([place]) 12/03/2011   PVD (peripheral vascular disease) ([place]) 10/31/2011   RSD (reflex sympathetic dystrophy) 10/31/2011   Vitamin D deficiency    Preop exam for internal medicine 10/28/2011   Anxiety 03/11/2011   COPD (chronic obstructive pulmonary disease) ([place]) 02/02/2011   Anemia 02/02/2011   History of rheumatic fever 02/02/2011  Insomnia 02/02/2011   Coronary atherosclerosis 09/08/2010   CONSTIPATION 02/19/2010   TOBACCO ABUSE 07/21/2009   MURMUR 06/17/2009   DISC DISEASE, CERVICAL 03/20/2009   Pain in Soft Tissues of Limb 05/23/2008   Hypothyroidism 09/07/2007   Hyperlipidemia LDL goal <70 09/07/2007   Essential hypertension 09/07/2007   ALLERGIC RHINITIS 09/07/2007   GERD 09/07/2007   Primary osteoarthritis of right knee 09/07/2007   Osteopenia 09/07/2007   COLONIC POLYPS, HX OF 09/07/2007    Past Surgical History:  Procedure Laterality Date   ABDOMINAL HYSTERECTOMY  1977   "partial"   BALLOON ANGIOPLASTY, ARTERY Right 03/21/2014    SFA   CARDIAC CATHETERIZATION  2011    CARPAL TUNNEL RELEASE Right 2005   CATARACT EXTRACTION Left 2013   CORONARY ARTERY BYPASS GRAFT  10/2009   LIMA to the LAD, saphenous vein graft to the acute marginal, saphenous vein graft to the diagonal and obtuse marginal.   DILATION AND CURETTAGE OF UTERUS  1978   JOINT REPLACEMENT     LOWER EXTREMITY ANGIOGRAM N/A 03/21/2014   Procedure: LOWER EXTREMITY ANGIOGRAM;  Surgeon: Lorretta Harp, MD;  Location: [facility] CATH LAB;  Service: Cardiovascular;  Laterality: N/A;   LOWER EXTREMITY ANGIOGRAM  06/07/2016   Abdominal aortogram/bilateral iliac angiogram/bifemoral runoff   LOWER EXTREMITY ANGIOGRAPHY N/A 02/06/2018   Procedure: LOWER EXTREMITY ANGIOGRAPHY;  Surgeon: Lorretta Harp, MD;  Location: [facility];  Service: Cardiovascular;  Laterality: N/A;   PERIPHERAL VASCULAR ATHERECTOMY  02/06/2018   Procedure: PERIPHERAL VASCULAR ATHERECTOMY;  Surgeon: Lorretta Harp, MD;  Location: [facility];  Service: Cardiovascular;;   PERIPHERAL VASCULAR CATHETERIZATION Bilateral 06/07/2016   Procedure: Lower Extremity Angiography;  Surgeon: Lorretta Harp, MD;  Location: [facility];  Service: Cardiovascular;  Laterality: Bilateral;   PERIPHERAL VASCULAR CATHETERIZATION Left 06/07/2016   Procedure: Peripheral Vascular Atherectomy;  Surgeon: Lorretta Harp, MD;  Location: [facility];  Service: Cardiovascular;  Laterality: Left;  SFA   PERIPHERAL VASCULAR CATHETERIZATION Left 06/07/2016   Procedure: Peripheral Vascular Balloon Angioplasty;  Surgeon: Lorretta Harp, MD;  Location: [facility];  Service: Cardiovascular;  Laterality: Left;  SFA   THYROIDECTOMY  1995   TONSILLECTOMY     TOTAL KNEE ARTHROPLASTY Right 06/18/2014   Procedure: RIGHT TOTAL KNEE ARTHROPLASTY;  Surgeon: Tobi Bastos, MD;  Location: [facility];  Service: Orthopedics;  Laterality: Right;    OB History   No obstetric history on file.      Home  Medications    Prior to Admission medications   Medication Sig Start Date End Date Taking? Authorizing Provider  albuterol (VENTOLIN HFA) 108 (90 Base) MCG/ACT inhaler INHALE 1 PUFF BY MOUTH EVERY 6 HOURS AS NEEDED FOR SHORTNESS OF BREATH AND FOR WHEEZING 12/15/18   [provider]  alclomethasone (ACLOVATE) 0.05 % cream Apply 1 application topically as needed.  06/12/18   [provider]  amLODipine (NORVASC) 10 MG tablet TAKE ONE TABLET BY MOUTH ONCE DAILY 08/19/16   Biagio Borg, MD  aspirin EC 81 MG tablet Take 81 mg by mouth daily.    [provider]  Ocie Bob AG HOME TEST KIT Use as Directed on the Package 05/25/21   [provider]  Calcium Carb-Cholecalciferol (CALCIUM + D3) 600-800 MG-UNIT TABS Take 1 tablet by mouth 2 (two) times daily. 08/04/09   [provider]  clopidogrel (PLAVIX) 75 MG tablet daily. 04/25/18   [provider]  Coenzyme  Q10 (CO Q10) 30 MG CAPS Take 1 capsule by mouth daily. 08/20/15   [provider]  CONSTULOSE 10 GM/15ML solution TAKE 30 ML BY MOUTH TWICE DAILY AS NEEDED 10/25/18   [provider]  cyclobenzaprine (FLEXERIL) 10 MG tablet Take 10 mg by mouth at bedtime as needed. 12/25/20   [provider]  DULoxetine (CYMBALTA) 30 MG capsule Take by mouth daily. 07/26/18   [provider]  ergocalciferol (VITAMIN D2) 1.25 MG (50000 UT) capsule 1 po weekly 03/14/20   [provider]  escitalopram (LEXAPRO) 5 MG tablet one po am daily as directed 06/15/18   [provider]  EUTHYROX 100 MCG tablet Take 100 mcg by mouth every morning. 05/26/21   [provider]  ezetimibe (ZETIA) 10 MG tablet Take 10 mg by mouth daily. 06/18/20   [provider]  fluticasone (FLONASE) 50 MCG/ACT nasal spray USE 1 TO 2 SPRAY(S) IN EACH NOSTRIL ONCE DAILY AS NEEDED FOR 30 DAYS 12/02/20   [provider]  gabapentin (NEURONTIN) 100 MG capsule Take 1  capsule (100 mg total) by mouth 3 (three) times daily. Take 1 pill in AM- and 2 pills at night. - for nerve pain 02/09/21   Lovorn, Jinny Blossom, MD  hydrOXYzine (ATARAX/VISTARIL) 25 MG tablet Take 25 mg by mouth 3 (three) times daily.  09/19/19   [provider]  Lancets (ONETOUCH DELICA PLUS [id number]) [place] 1 each by Other route 2 (two) times daily. 06/29/18   [provider]  levothyroxine (SYNTHROID, LEVOTHROID) 150 MCG tablet Take 150 mcg by mouth daily before breakfast.    [provider]  meloxicam (MOBIC) 7.5 MG tablet Take 7.5 mg by mouth daily. 12/02/20   [provider]  Multiple Vitamin (MULTIVITAMIN WITH MINERALS) TABS tablet Take 1 tablet by mouth every morning. Centrum    [provider]  nystatin cream (MYCOSTATIN) Apply topically. 06/05/20   [provider]  [place] VERIO test strip 1 each by Other route 2 (two) times daily. 06/28/18   [provider]  pantoprazole (PROTONIX) 40 MG tablet Take 1 tablet (40 mg total) by mouth 2 (two) times daily. 07/27/21   LampteyMyrene Galas, MD  Propylene Glycol 0.6 % SOLN Place 1 drop into both eyes every morning.    [provider]  triamcinolone ointment (KENALOG) 0.1 %  09/25/19   [provider]  Vitamin D, Cholecalciferol, 25 MCG (1000 UT) CAPS Take 1 capsule by mouth daily. 08/04/09   [provider]  Vitamin D, Ergocalciferol, (DRISDOL) 1.25 MG (50000 UNIT) CAPS capsule Take 50,000 Units by mouth once a week. 03/14/20   [provider]    Family History Family History  Problem Relation Age of Onset   Arthritis Maternal Aunt    Heart disease Maternal Aunt    Hypertension Maternal Aunt    Diabetes Maternal Aunt    Colon cancer Neg Hx    Kidney disease Neg Hx    Liver disease Neg Hx    Throat cancer Neg Hx    Stomach cancer Neg Hx    Colon polyps Neg Hx     Social History Social History   Tobacco Use   Smoking status: Former    Packs/day: 0.80     Years: 45.00    Pack years: 36.00    Types: Cigarettes    Quit date: 08/23/2009    Years since quitting: 11.9   Smokeless tobacco: Never  Vaping Use   Vaping Use: Never used  Substance Use Topics   Alcohol use: Yes    Alcohol/week: 2.0 standard drinks    Types: 2 Glasses of wine per week   Drug use: Never     Allergies   Atorvastatin   Review of Systems Review of Systems As per HPI  Physical Exam Triage Vital Signs ED Triage Vitals  Enc Vitals Group     BP 07/27/21 0825 (!) 160/74     Pulse Rate 07/27/21 0825 82     Resp 07/27/21 0825 16     Temp 07/27/21 0825 98 F (36.7 C)     Temp Source 07/27/21 0825 Oral     SpO2 07/27/21 0825 97 %     Weight --      Height --      Head Circumference --      Peak Flow --      Pain Score 07/27/21 0823 5     Pain Loc --      Pain Edu? --      Excl. in [place]? --    No data found.  Updated Vital Signs BP (!) 160/74 (BP Location: Left Arm)   Pulse 82   Temp 98 F (36.7 C) (Oral)   Resp 16   SpO2 97%   Visual Acuity Right Eye Distance:   Left Eye Distance:   Bilateral Distance:    Right Eye Near:   Left Eye Near:    Bilateral Near:     Physical Exam Vitals and nursing note reviewed.  Constitutional:      Appearance: Normal appearance. She is not ill-appearing.  HENT:     Right Ear: Tympanic membrane normal.     Left Ear: Tympanic membrane normal.  Cardiovascular:     Rate and Rhythm: Normal rate and regular rhythm.     Pulses: Normal pulses.  Pulmonary:     Effort: Pulmonary effort is normal.     Breath sounds: Normal breath sounds.  Abdominal:     General: Bowel sounds are normal.     Palpations: Abdomen is soft.  Musculoskeletal:        General: Normal range of motion.  Skin:    Capillary Refill: Capillary refill takes less than 2 seconds.  Neurological:     Mental Status: She is alert.     UC Treatments / Results  Labs (all labs ordered are listed, but only abnormal results are  displayed) Labs Reviewed - No data to display  EKG   Radiology DG Chest 2 View  Result Date: 07/27/2021 CLINICAL DATA:  Chest pain EXAM: CHEST - 2 VIEW COMPARISON:  05/14/2018, 03/05/2021 FINDINGS: Post CABG changes. The heart size and mediastinal contours are within normal limits. Aortic atherosclerosis. Hyperinflated lungs with coarsened interstitial markings. Chronic biapical scarring. Approximately 8 mm nodular density in the right upper lobe is grossly similar in appearance to the previous chest CT, when accounting for differences in technique. No focal airspace consolidation, pleural effusion, or pneumothorax. The visualized skeletal structures are unremarkable. IMPRESSION: 1. No active cardiopulmonary disease. 2. 8 mm nodular density in the right upper lobe is grossly similar in appearance to the previous chest CT. 3. COPD. Electronically Signed   By: Davina Poke D.O.   On: 07/27/2021 09:11    Procedures Procedures (including critical care time)  Medications Ordered in UC Medications - No data to display  Initial Impression / Assessment and Plan / UC Course  I have reviewed the triage vital signs and the nursing notes.  Pertinent  labs & imaging results that were available during my care of the patient were reviewed by me and considered in my medical decision making (see chart for details).     1.  Musculoskeletal chest pain: Chest x-ray is negative for acute lung infiltrate Tylenol as Motrin for pain Heating pad use recommended Protonix increased to 40 mg twice daily Patient is advised to follow-up with her cardiologist or primary care physician if symptoms persist Return precautions were given. Final Clinical Impressions(s) / UC Diagnoses   Final diagnoses:  Musculoskeletal chest pain     Discharge Instructions      Please take medications as prescribed I increased Protonix to 40 mg twice daily Tylenol as needed for pain Heating pad use on a 20-minute on-20  minutes of cycle will help Gentle stretches will help as well. Return to urgent care if symptoms worsen   ED Prescriptions     Medication Sig Dispense Auth. Provider   pantoprazole (PROTONIX) 40 MG tablet Take 1 tablet (40 mg total) by mouth 2 (two) times daily. 30 tablet Rindy Kollman, Myrene Galas, MD      PDMP not reviewed this encounter.   Chase Picket, MD 07/27/21 [phone]

## 2021-07-27 NOTE — ED Triage Notes (Signed)
Pt presents with mid chest pain that radiates to left side under ribs to back Xs 3 days. States has been burping a lot. Reflux medication is not giving relief.

## 2021-08-09 ENCOUNTER — Ambulatory Visit (HOSPITAL_COMMUNITY)
Admission: EM | Admit: 2021-08-09 | Discharge: 2021-08-09 | Disposition: A | Discharge status: Home / Self Care | Payer: Medicare Other | Attending: Family Medicine | Admitting: Family Medicine

## 2021-08-09 ENCOUNTER — Encounter (HOSPITAL_COMMUNITY): Payer: Self-pay | Admitting: Emergency Medicine

## 2021-08-09 ENCOUNTER — Other Ambulatory Visit: Payer: Self-pay

## 2021-08-09 DIAGNOSIS — M542 Cervicalgia: Secondary | ICD-10-CM | POA: Diagnosis not present

## 2021-08-09 MED ORDER — KETOROLAC TROMETHAMINE 30 MG/ML IJ SOLN
INTRAMUSCULAR | Status: AC
  Filled 2021-08-09: qty 1

## 2021-08-09 MED ORDER — KETOROLAC TROMETHAMINE 30 MG/ML IJ SOLN
30.0000 mg | Freq: Once | INTRAMUSCULAR | Status: AC
  Administered 2021-08-09: 12:00:00 30 mg via INTRAMUSCULAR

## 2021-08-09 NOTE — ED Triage Notes (Signed)
Pt reports that Friday started having headache and left neck pains that radiates to her upper left back and left shoulder. Here not to long ago for similar symptoms.

## 2021-08-09 NOTE — Discharge Instructions (Addendum)
Take tylenol as needed at home. Warm compresses or heating pad to relax the muscle and soothe the pain. Neck stretches can help. Check in with your primary doctor about these pains you have been having.

## 2021-08-09 NOTE — ED Provider Notes (Signed)
[facility]    CSN: [id number] Arrival date & time: 08/09/21  1013      History   Chief Complaint Chief Complaint  Patient presents with   Headache   Neck Pain   Shoulder Pain    HPI [name] is a 75 y.o. female.    Headache Associated symptoms: neck pain   Neck Pain Associated symptoms: headaches   Shoulder Pain Associated symptoms: neck pain   Here for a 3 day h/o left posterior neck pain, up into her left head. Hurts to turn her head more. Also having some discomfort when swallows on her left anterior throat.   Has noted some sharp tooth pain bilaterally across her cheeks/.  Uncertain fever; states felt cold when it was warm in the house. And maybe one nostril has had some congestion, and coughed a little. Did take coricidin and made her sleepy.  Was seen here 12/5 and had right sided shoulder and side pain. That has slowly improved since seen here.  Does see a cardiologist. Has had CABG x 4 in the past.  Cr nl at last check  Has been taking the protonix more as needed. Had an episode of left chest discomfort in the night 12/16, lasted 1 minute, then burped some.  Past Medical History:  Diagnosis Date   Anemia    Anxiety    hx   Arthritis    "my whole body"   Atrial septal aneurysm    COLONIC POLYPS, HX OF    COPD (chronic obstructive pulmonary disease) (HCC)    no per pt on 03/21/2014 & 02/06/2018   Coronary artery disease    a. s/p CABG 2012.   DISC DISEASE, CERVICAL    Gastroesophageal reflux disease    Headache    "I have headaches all the time" (02/06/2018)   Heart murmur    as a child   History of rheumatic fever    Hyperlipidemia    Hypertension    Hypothyroidism    Insomnia    Osteoarthritis    OSTEOPENIA    PAD (peripheral artery disease) ([place])    a. PTA to R SFA 2015. b. PTA to L SFA 05/2016.   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   Plantar fasciitis of left foot 10/02/2019   Status post partial hysterectomy 10/24/2018   Discharge from the vagina 10/24/2018   Claudication in peripheral vascular disease ([place]) 02/06/2018   Chronic idiopathic constipation 10/07/2017   CAD in native artery 06/08/2016   Skin inflammation 04/06/2016   Contracture of hand joint 04/06/2016   Microhematuria 11/12/2015   Left shoulder pain 10/15/2015   Trapezius muscle spasm 09/24/2015   Type 2 diabetes mellitus ([place]) 08/07/2015   Urinary frequency 04/04/2015   External hemorrhoid 11/02/2014   Left-sided thoracic back pain 10/03/2014   Gingivitis 10/03/2014   Routine general medical examination at a health care facility 09/10/2014   Total knee replacement status 06/18/2014   Tooth pain 04/18/2014   Claudication ([place]) 03/21/2014   Primary localized osteoarthrosis, lower leg 08/10/2013   Right knee pain 08/02/2013   Upper respiratory infection 08/02/2013  Dyspepsia 11/16/2012   Knee pain, right 02/18/2012   PAD (peripheral artery disease) ([place]) 12/03/2011   PVD (peripheral vascular disease) ([place]) 10/31/2011   RSD (reflex sympathetic dystrophy) 10/31/2011   Vitamin D deficiency    Preop exam for internal medicine 10/28/2011   Anxiety 03/11/2011   COPD (chronic obstructive pulmonary disease) ([place]) 02/02/2011   Anemia 02/02/2011   History of rheumatic fever 02/02/2011   Insomnia 02/02/2011   Coronary atherosclerosis 09/08/2010   CONSTIPATION 02/19/2010   TOBACCO ABUSE 07/21/2009   MURMUR 06/17/2009   [place] DISEASE, CERVICAL 03/20/2009   Pain in Soft Tissues of Limb 05/23/2008   Hypothyroidism 09/07/2007   Hyperlipidemia LDL goal <70 09/07/2007   Essential  hypertension 09/07/2007   ALLERGIC RHINITIS 09/07/2007   GERD 09/07/2007   Primary osteoarthritis of right knee 09/07/2007   Osteopenia 09/07/2007   COLONIC POLYPS, HX OF 09/07/2007    Past Surgical History:  Procedure Laterality Date   ABDOMINAL HYSTERECTOMY  1977   "partial"   BALLOON ANGIOPLASTY, ARTERY Right 03/21/2014   SFA   CARDIAC CATHETERIZATION  2011    CARPAL TUNNEL RELEASE Right 2005   CATARACT EXTRACTION Left 2013   CORONARY ARTERY BYPASS GRAFT  10/2009   LIMA to the LAD, saphenous vein graft to the acute marginal, saphenous vein graft to the diagonal and obtuse marginal.   DILATION AND CURETTAGE OF UTERUS  1978   JOINT REPLACEMENT     LOWER EXTREMITY ANGIOGRAM N/A 03/21/2014   Procedure: LOWER EXTREMITY ANGIOGRAM;  Surgeon: Lorretta Harp, MD;  Location: [facility] CATH LAB;  Service: Cardiovascular;  Laterality: N/A;   LOWER EXTREMITY ANGIOGRAM  06/07/2016   Abdominal aortogram/bilateral iliac angiogram/bifemoral runoff   LOWER EXTREMITY ANGIOGRAPHY N/A 02/06/2018   Procedure: LOWER EXTREMITY ANGIOGRAPHY;  Surgeon: Lorretta Harp, MD;  Location: [place] CV LAB;  Service: Cardiovascular;  Laterality: N/A;   PERIPHERAL VASCULAR ATHERECTOMY  02/06/2018   Procedure: PERIPHERAL VASCULAR ATHERECTOMY;  Surgeon: Lorretta Harp, MD;  Location: [place] [facility] LAB;  Service: Cardiovascular;;   PERIPHERAL VASCULAR CATHETERIZATION Bilateral 06/07/2016   Procedure: Lower Extremity Angiography;  Surgeon: Lorretta Harp, MD;  Location: [place] CV LAB;  Service: Cardiovascular;  Laterality: Bilateral;   PERIPHERAL VASCULAR CATHETERIZATION Left 06/07/2016   Procedure: Peripheral Vascular Atherectomy;  Surgeon: Lorretta Harp, MD;  Location: [place] [facility] LAB;  Service: Cardiovascular;  Laterality: Left;  SFA   PERIPHERAL VASCULAR CATHETERIZATION Left 06/07/2016   Procedure: Peripheral Vascular Balloon Angioplasty;  Surgeon: Lorretta Harp, MD;  Location: [place] [facility] LAB;   Service: Cardiovascular;  Laterality: Left;  SFA   THYROIDECTOMY  1995   TONSILLECTOMY     TOTAL KNEE ARTHROPLASTY Right 06/18/2014   Procedure: RIGHT TOTAL KNEE ARTHROPLASTY;  Surgeon: Tobi Bastos, MD;  Location: [facility];  Service: Orthopedics;  Laterality: Right;    OB History   No obstetric history on file.      Home Medications    Prior to Admission medications   Medication Sig Start Date End Date Taking? Authorizing Provider  albuterol (VENTOLIN HFA) 108 (90 Base) MCG/ACT inhaler INHALE 1 PUFF BY MOUTH EVERY 6 HOURS AS NEEDED FOR SHORTNESS OF BREATH AND FOR WHEEZING 12/15/18   [provider]  alclomethasone (ACLOVATE) 0.05 % cream Apply 1 application topically as needed.  06/12/18   [provider]  amLODipine (NORVASC) 10 MG tablet TAKE ONE TABLET BY MOUTH ONCE DAILY 08/19/16   Biagio Borg, MD  aspirin EC 81 MG  tablet Take 81 mg by mouth daily.    [provider]  Ocie Bob AG HOME TEST KIT Use as Directed on the Package 05/25/21   [provider]  Calcium Carb-Cholecalciferol (CALCIUM + D3) 600-800 MG-UNIT TABS Take 1 tablet by mouth 2 (two) times daily. 08/04/09   [provider]  clopidogrel (PLAVIX) 75 MG tablet daily. 04/25/18   [provider]  Coenzyme Q10 (CO Q10) 30 MG CAPS Take 1 capsule by mouth daily. 08/20/15   [provider]  CONSTULOSE 10 GM/15ML solution TAKE 30 ML BY MOUTH TWICE DAILY AS NEEDED 10/25/18   [provider]  cyclobenzaprine (FLEXERIL) 10 MG tablet Take 10 mg by mouth at bedtime as needed. 12/25/20   [provider]  DULoxetine (CYMBALTA) 30 MG capsule Take by mouth daily. 07/26/18   [provider]  ergocalciferol (VITAMIN D2) 1.25 MG (50000 UT) capsule 1 po weekly 03/14/20   [provider]  escitalopram (LEXAPRO) 5 MG tablet one po am daily as directed 06/15/18   [provider]  EUTHYROX 100 MCG tablet Take 100 mcg by mouth every  morning. 05/26/21   [provider]  ezetimibe (ZETIA) 10 MG tablet Take 10 mg by mouth daily. 06/18/20   [provider]  fluticasone (FLONASE) 50 MCG/ACT nasal spray USE 1 TO 2 SPRAY(S) IN EACH NOSTRIL ONCE DAILY AS NEEDED FOR 30 DAYS 12/02/20   [provider]  gabapentin (NEURONTIN) 100 MG capsule Take 1 capsule (100 mg total) by mouth 3 (three) times daily. Take 1 pill in AM- and 2 pills at night. - for nerve pain 02/09/21   Lovorn, Jinny Blossom, MD  hydrOXYzine (ATARAX/VISTARIL) 25 MG tablet Take 25 mg by mouth 3 (three) times daily.  09/19/19   [provider]  Lancets (ONETOUCH DELICA PLUS [id number]) [place] 1 each by Other route 2 (two) times daily. 06/29/18   [provider]  levothyroxine (SYNTHROID, LEVOTHROID) 150 MCG tablet Take 150 mcg by mouth daily before breakfast.    [provider]  meloxicam (MOBIC) 7.5 MG tablet Take 7.5 mg by mouth daily. 12/02/20   [provider]  Multiple Vitamin (MULTIVITAMIN WITH MINERALS) TABS tablet Take 1 tablet by mouth every morning. Centrum    [provider]  nystatin cream (MYCOSTATIN) Apply topically. 06/05/20   [provider]  [facility] VERIO test strip 1 each by Other route 2 (two) times daily. 06/28/18   [provider]  pantoprazole (PROTONIX) 40 MG tablet Take 1 tablet (40 mg total) by mouth 2 (two) times daily. 07/27/21   LampteyMyrene Galas, MD  Propylene Glycol 0.6 % SOLN Place 1 drop into both eyes every morning.    [provider]  triamcinolone ointment (KENALOG) 0.1 %  09/25/19   [provider]  Vitamin D, Cholecalciferol, 25 MCG (1000 UT) CAPS Take 1 capsule by mouth daily. 08/04/09   [provider]  Vitamin D, Ergocalciferol, (DRISDOL) 1.25 MG (50000 UNIT) CAPS capsule Take 50,000 Units by mouth once a week. 03/14/20   [provider]    Family History Family History  Problem Relation Age of Onset   Arthritis Maternal Aunt     Heart disease Maternal Aunt    Hypertension Maternal Aunt    Diabetes Maternal Aunt    Colon cancer Neg Hx    Kidney disease Neg Hx    Liver disease Neg Hx    Throat cancer Neg Hx    Stomach cancer Neg Hx  Colon polyps Neg Hx     Social History Social History   Tobacco Use   Smoking status: Former    Packs/day: 0.80    Years: 45.00    Pack years: 36.00    Types: Cigarettes    Quit date: 08/23/2009    Years since quitting: 11.9   Smokeless tobacco: Never  Vaping Use   Vaping Use: Never used  Substance Use Topics   Alcohol use: Yes    Alcohol/week: 2.0 standard drinks    Types: 2 Glasses of wine per week   Drug use: Never     Allergies   Atorvastatin   Review of Systems Review of Systems  Musculoskeletal:  Positive for neck pain.  Neurological:  Positive for headaches.    Physical Exam Triage Vital Signs ED Triage Vitals  Enc Vitals Group     BP 08/09/21 1101 (!) 151/80     Pulse Rate 08/09/21 1101 82     Resp 08/09/21 1101 18     Temp 08/09/21 1101 98 F (36.7 C)     Temp Source 08/09/21 1101 Oral     SpO2 08/09/21 1101 95 %     Weight --      Height --      Head Circumference --      Peak Flow --      Pain Score 08/09/21 1100 10     Pain Loc --      Pain Edu? --      Excl. in [place]? --    No data found.  Updated Vital Signs BP (!) 151/80 (BP Location: Left Arm)    Pulse 82    Temp 98 F (36.7 C) (Oral)    Resp 18    SpO2 95%   Visual Acuity Right Eye Distance:   Left Eye Distance:   Bilateral Distance:    Right Eye Near:   Left Eye Near:    Bilateral Near:     Physical Exam Constitutional:      General: She is not in acute distress.    Appearance: She is not toxic-appearing.  HENT:     Head: Atraumatic.     Right Ear: Tympanic membrane and ear canal normal.     Left Ear: Tympanic membrane and ear canal normal.     Nose: Nose normal.     Mouth/Throat:     Mouth: Mucous membranes are moist.     Pharynx: No oropharyngeal exudate or  posterior oropharyngeal erythema.     Comments: edentulous Eyes:     Conjunctiva/sclera: Conjunctivae normal.     Pupils: Pupils are equal, round, and reactive to light.  Cardiovascular:     Rate and Rhythm: Normal rate and regular rhythm.     Heart sounds: No murmur heard. Pulmonary:     Effort: Pulmonary effort is normal.     Breath sounds: No stridor. No wheezing, rhonchi or rales.  Chest:     Chest wall: No tenderness.  Musculoskeletal:     Cervical back: Neck supple. Tenderness (left trapezius, onto left shoulder posteriorly) present.  Lymphadenopathy:     Cervical: No cervical adenopathy.  Skin:    Coloration: Skin is not jaundiced or pale.  Neurological:     General: No focal deficit present.     Mental Status: She is alert and oriented to person, place, and time.  Psychiatric:        Behavior: Behavior normal.     UC Treatments / Results  Labs (  all labs ordered are listed, but only abnormal results are displayed) Labs Reviewed - No data to display  EKG   Radiology No results found.  Procedures Procedures (including critical care time)  Medications Ordered in UC Medications  ketorolac (TORADOL) 30 MG/ML injection 30 mg (has no administration in time range)    Initial Impression / Assessment and Plan / UC Course  I have reviewed the triage vital signs and the nursing notes.  Pertinent labs & imaging results that were available during my care of the patient were reviewed by me and considered in my medical decision making (see chart for details).     Discussed that this seems muscular in nature. Toradol for the acute pain here IM.  Final Clinical Impressions(s) / UC Diagnoses   Final diagnoses:  Neck pain     Discharge Instructions      Take tylenol as needed at home. Warm compresses or heating pad to relax the muscle and soothe the pain. Neck stretches can help. Check in with your primary doctor about these pains you have been having.     ED  Prescriptions   None    PDMP not reviewed this encounter.   Barrett Henle, MD 08/09/21 [phone]

## 2021-08-25 ENCOUNTER — Other Ambulatory Visit (HOSPITAL_COMMUNITY): Payer: Self-pay | Admitting: Cardiovascular Disease

## 2021-08-25 ENCOUNTER — Other Ambulatory Visit: Payer: Self-pay

## 2021-08-25 ENCOUNTER — Ambulatory Visit (HOSPITAL_COMMUNITY)
Admission: RE | Admit: 2021-08-25 | Discharge: 2021-08-25 | Disposition: A | Discharge status: Home / Self Care | Payer: Medicare Other | Source: Ambulatory Visit | Attending: Internal Medicine | Admitting: Internal Medicine

## 2021-08-25 DIAGNOSIS — Z9862 Peripheral vascular angioplasty status: Secondary | ICD-10-CM

## 2021-08-25 DIAGNOSIS — I739 Peripheral vascular disease, unspecified: Secondary | ICD-10-CM | POA: Insufficient documentation

## 2021-08-26 ENCOUNTER — Other Ambulatory Visit: Payer: Self-pay | Admitting: *Deleted

## 2021-08-26 DIAGNOSIS — R911 Solitary pulmonary nodule: Secondary | ICD-10-CM

## 2021-09-04 ENCOUNTER — Ambulatory Visit: Payer: Medicare Other | Admitting: Cardiology

## 2021-09-22 ENCOUNTER — Other Ambulatory Visit: Payer: Self-pay

## 2021-09-22 ENCOUNTER — Ambulatory Visit (INDEPENDENT_AMBULATORY_CARE_PROVIDER_SITE_OTHER): Payer: Medicare Other | Admitting: Podiatry

## 2021-09-22 ENCOUNTER — Encounter: Payer: Self-pay | Admitting: Podiatry

## 2021-09-22 DIAGNOSIS — E1151 Type 2 diabetes mellitus with diabetic peripheral angiopathy without gangrene: Secondary | ICD-10-CM

## 2021-09-22 DIAGNOSIS — E119 Type 2 diabetes mellitus without complications: Secondary | ICD-10-CM

## 2021-09-22 DIAGNOSIS — M2041 Other hammer toe(s) (acquired), right foot: Secondary | ICD-10-CM

## 2021-09-22 DIAGNOSIS — L84 Corns and callosities: Secondary | ICD-10-CM | POA: Diagnosis not present

## 2021-09-22 DIAGNOSIS — M2011 Hallux valgus (acquired), right foot: Secondary | ICD-10-CM

## 2021-09-22 DIAGNOSIS — B351 Tinea unguium: Secondary | ICD-10-CM

## 2021-09-22 DIAGNOSIS — M79676 Pain in unspecified toe(s): Secondary | ICD-10-CM

## 2021-09-22 DIAGNOSIS — M2012 Hallux valgus (acquired), left foot: Secondary | ICD-10-CM

## 2021-09-27 NOTE — Progress Notes (Signed)
ANNUAL DIABETIC FOOT EXAM  Subjective: [name] presents today for annual diabetic foot examination, at risk foot care. Pt has h/o NIDDM with PAD, and painful elongated mycotic toenails 1-5 bilaterally which are tender when wearing enclosed shoe gear. Pain is relieved with periodic professional debridement.  She is followed by Dr. Quay Burow for PAD.  Patient relates 10 year h/o diabetes.  Patient denies any h/o foot wounds.  Patient has been diagnosed with neuropathy and it is managed with gabapentin.  Patient cannot remember blood sugar reading from this morning.  Risk factors: diabetes, PAD, hyperlipidemia, HTN, CAD.  [name], FNP is patient's PCP. Last visit was 03/11/2021.  Past Medical History:  Diagnosis Date   Anemia    Anxiety    hx   Arthritis    "my whole body"   Atrial septal aneurysm    COLONIC POLYPS, HX OF    COPD (chronic obstructive pulmonary disease) (HCC)    no per pt on 03/21/2014 & 02/06/2018   Coronary artery disease    a. s/p CABG 2012.   DISC DISEASE, CERVICAL    Gastroesophageal reflux disease    Headache    "I have headaches all the time" (02/06/2018)   Heart murmur    as a child   History of rheumatic fever    Hyperlipidemia    Hypertension    Hypothyroidism    Insomnia    Osteoarthritis    OSTEOPENIA    PAD (peripheral artery disease) ([place])    a. PTA to R SFA 2015. b. PTA to L SFA 05/2016.   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   Plantar fasciitis of left foot 10/02/2019   Status post partial hysterectomy 10/24/2018   Discharge from the vagina 10/24/2018   Claudication  in peripheral vascular disease ([place]) 02/06/2018   Chronic idiopathic constipation 10/07/2017   CAD in native artery 06/08/2016   Skin inflammation 04/06/2016   Contracture of hand joint 04/06/2016   Microhematuria 11/12/2015   Left shoulder pain 10/15/2015   Trapezius muscle spasm 09/24/2015   Type 2 diabetes mellitus ([place]) 08/07/2015   Urinary frequency 04/04/2015   External hemorrhoid 11/02/2014   Left-sided thoracic back pain 10/03/2014   Gingivitis 10/03/2014   Routine general medical examination at a health care facility 09/10/2014   Total knee replacement status 06/18/2014   Tooth pain 04/18/2014   Claudication ([place]) 03/21/2014   Primary localized osteoarthrosis, lower leg 08/10/2013   Right knee pain 08/02/2013   Upper respiratory infection 08/02/2013   Dyspepsia 11/16/2012   Knee pain, right 02/18/2012   PAD (peripheral artery disease) ([place]) 12/03/2011   PVD (peripheral vascular disease) ([place]) 10/31/2011   RSD (reflex sympathetic dystrophy) 10/31/2011   Vitamin D deficiency    Preop exam for internal medicine 10/28/2011   Anxiety 03/11/2011   COPD (chronic obstructive pulmonary disease) ([place]) 02/02/2011   Anemia 02/02/2011   History of rheumatic fever 02/02/2011   Insomnia 02/02/2011   Coronary atherosclerosis 09/08/2010   CONSTIPATION 02/19/2010   TOBACCO ABUSE 07/21/2009   MURMUR 06/17/2009   [place] DISEASE, CERVICAL 03/20/2009   Pain in Soft Tissues of  Limb 05/23/2008   Hypothyroidism 09/07/2007   Hyperlipidemia LDL goal <70 09/07/2007   Essential hypertension 09/07/2007   ALLERGIC RHINITIS 09/07/2007   GERD 09/07/2007   Primary osteoarthritis of right knee 09/07/2007   Osteopenia 09/07/2007   COLONIC POLYPS, HX OF 09/07/2007   Past Surgical History:  Procedure Laterality Date   ABDOMINAL HYSTERECTOMY  1977   "partial"   BALLOON ANGIOPLASTY, ARTERY Right 03/21/2014   SFA   CARDIAC CATHETERIZATION  2011    CARPAL TUNNEL RELEASE Right 2005   CATARACT  EXTRACTION Left 2013   CORONARY ARTERY BYPASS GRAFT  10/2009   LIMA to the LAD, saphenous vein graft to the acute marginal, saphenous vein graft to the diagonal and obtuse marginal.   DILATION AND CURETTAGE OF UTERUS  1978   JOINT REPLACEMENT     LOWER EXTREMITY ANGIOGRAM N/A 03/21/2014   Procedure: LOWER EXTREMITY ANGIOGRAM;  Surgeon: Lorretta Harp, MD;  Location[facility] CATH LAB;  Service: Cardiovascular;  Laterality: N/A;   LOWER EXTREMITY ANGIOGRAM  06/07/2016   Abdominal aortogram/bilateral iliac angiogram/bifemoral runoff   LOWER EXTREMITY ANGIOGRAPHY N/A 02/06/2018   Procedure: LOWER EXTREMITY ANGIOGRAPHY;  Surgeon: Lorretta Harp, MD;  Location: [place] CV LAB;  Service: Cardiovascular;  Laterality: N/A;   PERIPHERAL VASCULAR ATHERECTOMY  02/06/2018   Procedure: PERIPHERAL VASCULAR ATHERECTOMY;  Surgeon: Lorretta Harp, MD;  Location: [place] CV LAB;  Service: Cardiovascular;;   PERIPHERAL VASCULAR CATHETERIZATION Bilateral 06/07/2016   Procedure: Lower Extremity Angiography;  Surgeon: Lorretta Harp, MD;  Location: [place] CV LAB;  Service: Cardiovascular;  Laterality: Bilateral;   PERIPHERAL VASCULAR CATHETERIZATION Left 06/07/2016   Procedure: Peripheral Vascular Atherectomy;  Surgeon: Lorretta Harp, MD;  Location: [place] CV LAB;  Service: Cardiovascular;  Laterality: Left;  SFA   PERIPHERAL VASCULAR CATHETERIZATION Left 06/07/2016   Procedure: Peripheral Vascular Balloon Angioplasty;  Surgeon: Lorretta Harp, MD;  Location: [place] CV LAB;  Service: Cardiovascular;  Laterality: Left;  SFA   THYROIDECTOMY  1995   TONSILLECTOMY     TOTAL KNEE ARTHROPLASTY Right 06/18/2014   Procedure: RIGHT TOTAL KNEE ARTHROPLASTY;  Surgeon: Tobi Bastos, MD;  Location: [facility] ORS;  Service: Orthopedics;  Laterality: Right;   Current Outpatient Medications on File Prior to Visit  Medication Sig Dispense Refill   albuterol (VENTOLIN HFA) 108 (90 Base) MCG/ACT inhaler  INHALE 1 PUFF BY MOUTH EVERY 6 HOURS AS NEEDED FOR SHORTNESS OF BREATH AND FOR WHEEZING     alclomethasone (ACLOVATE) 0.05 % cream Apply 1 application topically as needed.      amLODipine (NORVASC) 10 MG tablet TAKE ONE TABLET BY MOUTH ONCE DAILY 90 tablet 1   aspirin EC 81 MG tablet Take 81 mg by mouth daily.     BINAXNOW COVID-19 AG HOME TEST KIT Use as Directed on the Package     Calcium Carb-Cholecalciferol (CALCIUM + D3) 600-800 MG-UNIT TABS Take 1 tablet by mouth 2 (two) times daily.     clopidogrel (PLAVIX) 75 MG tablet daily.     Coenzyme Q10 (CO Q10) 30 MG CAPS Take 1 capsule by mouth daily.     CONSTULOSE 10 GM/15ML solution TAKE 30 ML BY MOUTH TWICE DAILY AS NEEDED     cyclobenzaprine (FLEXERIL) 10 MG tablet Take 10 mg by mouth at bedtime as needed.     DULoxetine (CYMBALTA) 30 MG capsule Take by mouth daily.     ergocalciferol (VITAMIN D2) 1.25 MG (50000 UT) capsule 1 po weekly  escitalopram (LEXAPRO) 5 MG tablet one po am daily as directed     EUTHYROX 100 MCG tablet Take 100 mcg by mouth every morning.     ezetimibe (ZETIA) 10 MG tablet Take 10 mg by mouth daily.     fluticasone (FLONASE) 50 MCG/ACT nasal spray USE 1 TO 2 SPRAY(S) IN EACH NOSTRIL ONCE DAILY AS NEEDED FOR 30 DAYS     gabapentin (NEURONTIN) 100 MG capsule Take 1 capsule (100 mg total) by mouth 3 (three) times daily. Take 1 pill in AM- and 2 pills at night. - for nerve pain 90 capsule 5   hydrOXYzine (ATARAX/VISTARIL) 25 MG tablet Take 25 mg by mouth 3 (three) times daily.      Lancets (ONETOUCH DELICA PLUS [id number]) MISC 1 each by Other route 2 (two) times daily.     levothyroxine (SYNTHROID, LEVOTHROID) 150 MCG tablet Take 150 mcg by mouth daily before breakfast.     meloxicam (MOBIC) 7.5 MG tablet Take 7.5 mg by mouth daily.     Multiple Vitamin (MULTIVITAMIN WITH MINERALS) TABS tablet Take 1 tablet by mouth every morning. Centrum     nystatin cream (MYCOSTATIN) Apply topically.     ONETOUCH VERIO test strip  1 each by Other route 2 (two) times daily.     pantoprazole (PROTONIX) 40 MG tablet Take 1 tablet (40 mg total) by mouth 2 (two) times daily. 30 tablet 6   Propylene Glycol 0.6 % SOLN Place 1 drop into both eyes every morning.     triamcinolone ointment (KENALOG) 0.1 %      Vitamin D, Cholecalciferol, 25 MCG (1000 UT) CAPS Take 1 capsule by mouth daily.     Vitamin D, Ergocalciferol, (DRISDOL) 1.25 MG (50000 UNIT) CAPS capsule Take 50,000 Units by mouth once a week.     Current Facility-Administered Medications on File Prior to Visit  Medication Dose Route Frequency Provider Last Rate Last Admin   0.9 %  sodium chloride infusion  500 mL Intravenous Once Milus Banister, MD       tranexamic acid (CYKLOKAPRON) 2,000 mg in sodium chloride 0.9 % 50 mL Topical Application  7,353 mg Topical Once [place], Amber, PA-C        Allergies  Allergen Reactions   Atorvastatin Other (See Comments)    Muscle aches Other reaction(s): Other (See Comments) Muscle aches   Social History   Occupational History   Occupation: Retired    Fish farm manager: RETIRED  Tobacco Use   Smoking status: Former    Packs/day: 0.80    Years: 45.00    Pack years: 36.00    Types: Cigarettes    Quit date: 08/23/2009    Years since quitting: 12.1   Smokeless tobacco: Never  Vaping Use   Vaping Use: Never used  Substance and Sexual Activity   Alcohol use: Yes    Alcohol/week: 2.0 standard drinks    Types: 2 Glasses of wine per week   Drug use: Never   Sexual activity: Not Currently   Family History  Problem Relation Age of Onset   Arthritis Maternal Aunt    Heart disease Maternal Aunt    Hypertension Maternal Aunt    Diabetes Maternal Aunt    Colon cancer Neg Hx    Kidney disease Neg Hx    Liver disease Neg Hx    Throat cancer Neg Hx    Stomach cancer Neg Hx    Colon polyps Neg Hx    Immunization History  Administered Date(s) Administered  H1N1 09/12/2008   Influenza Whole 07/24/2007, 05/23/2008,  06/17/2009, 06/16/2010   Influenza, High Dose Seasonal PF 05/15/2014, 05/22/2017   Influenza,inj,Quad PF,6+ Mos 05/11/2013, 08/23/2017   Influenza-Unspecified 03/24/2015, 05/19/2016   PFIZER(Purple Top)SARS-COV-2 Vaccination 09/14/2019, 10/05/2019   Pneumococcal Conjugate-13 08/02/2013   Pneumococcal Polysaccharide-23 09/07/2007, 11/16/2012   Td 09/12/2008   Zoster, Live 12/12/2007     Review of Systems: Negative except as noted in the HPI.   Objective: There were no vitals filed for this visit.  MECHILLE VARGHESE is a pleasant 76 y.o. female in NAD. AAO X 3.  Vascular Examination: CFT <3 seconds b/l LE. Diminished DP/PT pulses b/l LE. Pedal hair absent. No pain with calf compression b/l. Lower extremity skin temperature gradient within normal limits. No edema noted b/l LE. No ischemia or gangrene noted b/l LE. No cyanosis or clubbing noted b/l LE.  Dermatological Examination: Pedal integument with normal turgor, texture and tone b/l LE. No open wounds b/l. No interdigital macerations b/l. Toenails 1-5 b/l elongated, thickened, discolored with subungual debris. +Tenderness with dorsal palpation of nailplates. Hyperkeratotic lesion(s) noted R 4th toe and 1st metatarsal head left foot.  Musculoskeletal Examination: Muscle strength 5/5 to all lower extremity muscle groups bilaterally. No pain, crepitus or joint limitation noted with ROM bilateral LE. Hammertoe deformity noted 2-5 b/l.  Footwear Assessment: Does the patient wear appropriate shoes? Yes. Does the patient need inserts/orthotics? No.  Neurological Examination: Protective sensation intact 5/5 intact bilaterally with 10g monofilament b/l. Vibratory sensation intact b/l.  Lower Extremity Doppler Study 08/25/2021:  ABI Findings:  +---------+------------------+-----+----------+--------+   Right     Rt Pressure (mmHg) Index Waveform   Comment    +---------+------------------+-----+----------+--------+   Brachial  147                                             +---------+------------------+-----+----------+--------+   PTA       115                0.73  biphasic              +---------+------------------+-----+----------+--------+   PERO      135                      monophasic .85        +---------+------------------+-----+----------+--------+   DP        133                0.84  monophasic            +---------+------------------+-----+----------+--------+   Great Toe 119                0.75  Abnormal              +---------+------------------+-----+----------+--------+   +---------+------------------+-----+----------+-------+   Left      Lt Pressure (mmHg) Index Waveform   Comment   +---------+------------------+-----+----------+-------+   Brachial  158                                           +---------+------------------+-----+----------+-------+   PTA       102                0.65  monophasic           +---------+------------------+-----+----------+-------+  PERO      108                      monophasic .68       +---------+------------------+-----+----------+-------+   DP        121                0.77  monophasic           +---------+------------------+-----+----------+-------+   Great Toe 76                 0.48  Abnormal             +---------+------------------+-----+----------+-------+   +-------+-----------+-----------+------------+------------+   ABI/TBI Today's ABI Today's TBI Previous ABI Previous TBI   +-------+-----------+-----------+------------+------------+   Right   .85         .75         .84          .58            +-------+-----------+-----------+------------+------------+   Left    .77         .48         .77          .37            +-------+-----------+-----------+------------+------------+   Bilateral ABIs and left TBIs appear essentially unchanged compared to prior study on 08/13/2020. Right TBIs appear increased compared to prior study on 08/13/2020.     Summary:  Right:  Resting right ankle-brachial index indicates mild right lower extremity arterial disease. The right toe-brachial index is normal.   TBIs increased by .17.  Left: Resting left ankle-brachial index indicates moderate left lower extremity arterial disease. The left toe-brachial index is abnormal.   Assessment: 1. Pain due to onychomycosis of toenail   2. Corns and callosities   3. Hallux valgus, acquired, bilateral   4. Acquired hammertoe of right foot   5. Type II diabetes mellitus with peripheral circulatory disorder (HCC)   6. Encounter for diabetic foot exam ([place])      ADA Risk Categorization: High Risk  Patient has one or more of the following: Loss of protective sensation Absent pedal pulses Severe Foot deformity History of foot ulcer  Plan: -Patient is followed closely by Dr. Gwenlyn Found in Vascular; has had arterial dopplers recently.. -Diabetic foot examination performed today. -Continue foot and shoe inspections daily. Monitor blood glucose per PCP/Endocrinologist's recommendations. -Mycotic toenails 1-5 bilaterally were debrided in length and girth with sterile nail nippers and dremel without incident. -Corn(s) R 4th toe and callus(es) 1st metatarsal head left foot were pared utilizing sterile scalpel blade without incident. Total number debrided =2. -Patient/POA to call should there be question/concern in the interim.  Return in about 3 months (around 12/20/2021) for diabetic nail trim.  Marzetta Board, DPM

## 2021-10-01 ENCOUNTER — Ambulatory Visit
Admission: RE | Admit: 2021-10-01 | Discharge: 2021-10-01 | Disposition: A | Discharge status: Home / Self Care | Payer: Medicare Other | Source: Ambulatory Visit | Attending: Thoracic Surgery (Cardiothoracic Vascular Surgery) | Admitting: Thoracic Surgery (Cardiothoracic Vascular Surgery)

## 2021-10-01 DIAGNOSIS — R911 Solitary pulmonary nodule: Secondary | ICD-10-CM

## 2021-10-06 ENCOUNTER — Encounter: Payer: Medicare Other | Admitting: Thoracic Surgery (Cardiothoracic Vascular Surgery)

## 2021-10-20 ENCOUNTER — Ambulatory Visit: Payer: Medicare Other | Admitting: Thoracic Surgery (Cardiothoracic Vascular Surgery)

## 2021-10-21 ENCOUNTER — Other Ambulatory Visit: Payer: Self-pay

## 2021-10-21 ENCOUNTER — Ambulatory Visit (INDEPENDENT_AMBULATORY_CARE_PROVIDER_SITE_OTHER): Payer: Medicare Other

## 2021-10-21 ENCOUNTER — Encounter (HOSPITAL_COMMUNITY): Payer: Self-pay

## 2021-10-21 ENCOUNTER — Ambulatory Visit (HOSPITAL_COMMUNITY)
Admission: EM | Admit: 2021-10-21 | Discharge: 2021-10-21 | Disposition: A | Discharge status: Home / Self Care | Payer: Medicare Other | Attending: Nurse Practitioner | Admitting: Nurse Practitioner

## 2021-10-21 DIAGNOSIS — R0789 Other chest pain: Secondary | ICD-10-CM

## 2021-10-21 DIAGNOSIS — M542 Cervicalgia: Secondary | ICD-10-CM

## 2021-10-21 DIAGNOSIS — R052 Subacute cough: Secondary | ICD-10-CM | POA: Diagnosis not present

## 2021-10-21 MED ORDER — PANTOPRAZOLE SODIUM 40 MG PO TBEC: 40.0000 mg | DELAYED_RELEASE_TABLET | Freq: Every day | ORAL | 1 refills | Status: DC

## 2021-10-21 NOTE — ED Triage Notes (Signed)
Pt present for left side neck pain radiating down her left side of back. She does report having a cough for several weeks.  ?

## 2021-10-21 NOTE — Discharge Instructions (Signed)
The chest x-ray today does not show any pneumonia.  Your EKG looks similar when compared to previous EKGs.  Please continue the medication you already have for arthritis pain and muscle pain.  If your pain persists, please follow up with your primary care provider.  Please start pantoprazole 40 mg daily before breakfast and take for 8 weeks.  If your acid reflux symptoms persist despite this medication, please reach out to the stomach doctor to make an appointment.  ?

## 2021-10-21 NOTE — ED Provider Notes (Signed)
[name]    CSN: [id number] Arrival date & time: 10/21/21  4854      History   Chief Complaint No chief complaint on file.   HPI [name] is a 76 y.o. female.   Patient reports she tested positive for COVID 3 weeks ago.  She reports after the this, she started having pain in her neck rating down to her back.  She denies fevers, body aches, chills, congestion.  She does report occasional cough.  She does also report some painful inspiration and pain in the left and right side of her chest as well as frequent burping.  She reports her appetite has been somewhat decreased however she is drinking plenty of Gatorade.  She reports stable energy levels.  She is worried she may have pneumonia.  She does have a history of arthritis, COPD, CABG in 2012.  She does not remember the last time she saw a gastroenterologist.  She denies any history of abdominal hernia.   Past Medical History:  Diagnosis Date   Anemia    Anxiety    hx   Arthritis    "my whole body"   Atrial septal aneurysm    COLONIC POLYPS, HX OF    COPD (chronic obstructive pulmonary disease) (HCC)    no per pt on 03/21/2014 & 02/06/2018   Coronary artery disease    a. s/p CABG 2012.   DISC DISEASE, CERVICAL    Gastroesophageal reflux disease    Headache    "I have headaches all the time" (02/06/2018)   Heart murmur    as a child   History of rheumatic fever    Hyperlipidemia    Hypertension    Hypothyroidism    Insomnia    Osteoarthritis    OSTEOPENIA    PAD (peripheral artery disease) ([place])    a. PTA to R SFA 2015. b. PTA to L SFA 05/2016.   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   Plantar fasciitis of left foot 10/02/2019   Status post partial hysterectomy 10/24/2018   Discharge from the vagina 10/24/2018   Claudication in peripheral vascular disease ([place]) 02/06/2018   Chronic idiopathic constipation 10/07/2017   CAD in native artery 06/08/2016   Skin inflammation 04/06/2016   Contracture of hand joint 04/06/2016   Microhematuria 11/12/2015   Left shoulder pain 10/15/2015   Trapezius muscle spasm 09/24/2015   Type 2 diabetes mellitus ([place]) 08/07/2015   Urinary frequency 04/04/2015   External hemorrhoid 11/02/2014   Left-sided thoracic back pain 10/03/2014   Gingivitis 10/03/2014   Routine general medical examination at a health care facility 09/10/2014   Total knee replacement status 06/18/2014   Tooth pain 04/18/2014   Claudication ([place]) 03/21/2014   Primary localized osteoarthrosis, lower leg 08/10/2013   Right knee pain 08/02/2013   Upper respiratory infection 08/02/2013   Dyspepsia 11/16/2012   Knee pain, right 02/18/2012   PAD (peripheral artery disease) ([place]) 12/03/2011   PVD (peripheral vascular disease) ([place]) 10/31/2011   RSD (reflex sympathetic dystrophy) 10/31/2011   Vitamin D deficiency    Preop exam for internal medicine 10/28/2011  Anxiety 03/11/2011   COPD (chronic obstructive pulmonary disease) ([place]) 02/02/2011   Anemia 02/02/2011   History of rheumatic fever 02/02/2011   Insomnia 02/02/2011   Coronary atherosclerosis 09/08/2010   CONSTIPATION 02/19/2010   TOBACCO ABUSE 07/21/2009   MURMUR 06/17/2009   [place] DISEASE, CERVICAL 03/20/2009   Pain in Soft Tissues of Limb 05/23/2008   Hypothyroidism 09/07/2007   Hyperlipidemia LDL goal <70 09/07/2007   Essential hypertension 09/07/2007   ALLERGIC RHINITIS 09/07/2007   GERD 09/07/2007   Primary osteoarthritis of right knee 09/07/2007   Osteopenia 09/07/2007   COLONIC POLYPS, HX OF 09/07/2007    Past Surgical History:  Procedure Laterality  Date   ABDOMINAL HYSTERECTOMY  1977   "partial"   BALLOON ANGIOPLASTY, ARTERY Right 03/21/2014   SFA   CARDIAC CATHETERIZATION  2011    CARPAL TUNNEL RELEASE Right 2005   CATARACT EXTRACTION Left 2013   CORONARY ARTERY BYPASS GRAFT  10/2009   LIMA to the LAD, saphenous vein graft to the acute marginal, saphenous vein graft to the diagonal and obtuse marginal.   DILATION AND CURETTAGE OF UTERUS  1978   JOINT REPLACEMENT     LOWER EXTREMITY ANGIOGRAM N/A 03/21/2014   Procedure: LOWER EXTREMITY ANGIOGRAM;  Surgeon: Lorretta Harp, MD;  Location[facility] CATH LAB;  Service: Cardiovascular;  Laterality: N/A;   LOWER EXTREMITY ANGIOGRAM  06/07/2016   Abdominal aortogram/bilateral iliac angiogram/bifemoral runoff   LOWER EXTREMITY ANGIOGRAPHY N/A 02/06/2018   Procedure: LOWER EXTREMITY ANGIOGRAPHY;  Surgeon: Lorretta Harp, MD;  Location: [facility] CV LAB;  Service: Cardiovascular;  Laterality: N/A;   PERIPHERAL VASCULAR ATHERECTOMY  02/06/2018   Procedure: PERIPHERAL VASCULAR ATHERECTOMY;  Surgeon: Lorretta Harp, MD;  Location: [facility] CV LAB;  Service: Cardiovascular;;   PERIPHERAL VASCULAR CATHETERIZATION Bilateral 06/07/2016   Procedure: Lower Extremity Angiography;  Surgeon: Lorretta Harp, MD;  Location: [place] LAB;  Service: Cardiovascular;  Laterality: Bilateral;   PERIPHERAL VASCULAR CATHETERIZATION Left 06/07/2016   Procedure: Peripheral Vascular Atherectomy;  Surgeon: Lorretta Harp, MD;  Location: [facility] CV LAB;  Service: Cardiovascular;  Laterality: Left;  SFA   PERIPHERAL VASCULAR CATHETERIZATION Left 06/07/2016   Procedure: Peripheral Vascular Balloon Angioplasty;  Surgeon: Lorretta Harp, MD;  Location: [facility] LAB;  Service: Cardiovascular;  Laterality: Left;  SFA   THYROIDECTOMY  1995   TONSILLECTOMY     TOTAL KNEE ARTHROPLASTY Right 06/18/2014   Procedure: RIGHT TOTAL KNEE ARTHROPLASTY;  Surgeon: Tobi Bastos, MD;  Location: [facility] ORS;   Service: Orthopedics;  Laterality: Right;    OB History   No obstetric history on file.      Home Medications    Prior to Admission medications   Medication Sig Start Date End Date Taking? Authorizing Provider  albuterol (VENTOLIN HFA) 108 (90 Base) MCG/ACT inhaler INHALE 1 PUFF BY MOUTH EVERY 6 HOURS AS NEEDED FOR SHORTNESS OF BREATH AND FOR WHEEZING 12/15/18   [provider]  alclomethasone (ACLOVATE) 0.05 % cream Apply 1 application topically as needed.  06/12/18   [provider]  amLODipine (NORVASC) 10 MG tablet TAKE ONE TABLET BY MOUTH ONCE DAILY 08/19/16   Biagio Borg, MD  aspirin EC 81 MG tablet Take 81 mg by mouth daily.    [provider]  Ocie Bob AG HOME TEST KIT Use as Directed on the Package 05/25/21   [provider]  Calcium Carb-Cholecalciferol (CALCIUM + D3) 600-800 MG-UNIT TABS Take 1 tablet by mouth 2 (  two) times daily. 08/04/09   [provider]  clopidogrel (PLAVIX) 75 MG tablet daily. 04/25/18   [provider]  Coenzyme Q10 (CO Q10) 30 MG CAPS Take 1 capsule by mouth daily. 08/20/15   [provider]  CONSTULOSE 10 GM/15ML solution TAKE 30 ML BY MOUTH TWICE DAILY AS NEEDED 10/25/18   [provider]  cyclobenzaprine (FLEXERIL) 10 MG tablet Take 10 mg by mouth at bedtime as needed. 12/25/20   [provider]  DULoxetine (CYMBALTA) 30 MG capsule Take by mouth daily. 07/26/18   [provider]  ergocalciferol (VITAMIN D2) 1.25 MG (50000 UT) capsule 1 po weekly 03/14/20   [provider]  escitalopram (LEXAPRO) 5 MG tablet one po am daily as directed 06/15/18   [provider]  EUTHYROX 100 MCG tablet Take 100 mcg by mouth every morning. 05/26/21   [provider]  ezetimibe (ZETIA) 10 MG tablet Take 10 mg by mouth daily. 06/18/20   [provider]  fluticasone (FLONASE) 50 MCG/ACT nasal spray USE 1 TO 2 SPRAY(S) IN EACH NOSTRIL ONCE DAILY AS  NEEDED FOR 30 DAYS 12/02/20   [provider]  gabapentin (NEURONTIN) 100 MG capsule Take 1 capsule (100 mg total) by mouth 3 (three) times daily. Take 1 pill in AM- and 2 pills at night. - for nerve pain 02/09/21   Lovorn, Jinny Blossom, MD  hydrOXYzine (ATARAX/VISTARIL) 25 MG tablet Take 25 mg by mouth 3 (three) times daily.  09/19/19   [provider]  Lancets (ONETOUCH DELICA PLUS [id number]) [place] 1 each by Other route 2 (two) times daily. 06/29/18   [provider]  levothyroxine (SYNTHROID, LEVOTHROID) 150 MCG tablet Take 150 mcg by mouth daily before breakfast.    [provider]  meloxicam (MOBIC) 7.5 MG tablet Take 7.5 mg by mouth daily. 12/02/20   [provider]  Multiple Vitamin (MULTIVITAMIN WITH MINERALS) TABS tablet Take 1 tablet by mouth every morning. Centrum    [provider]  nystatin cream (MYCOSTATIN) Apply topically. 06/05/20   [provider]  [facility] VERIO test strip 1 each by Other route 2 (two) times daily. 06/28/18   [provider]  pantoprazole (PROTONIX) 40 MG tablet Take 1 tablet (40 mg total) by mouth daily before breakfast. 10/21/21   Eulogio Bear, NP  Propylene Glycol 0.6 % SOLN Place 1 drop into both eyes every morning.    [provider]  triamcinolone ointment (KENALOG) 0.1 %  09/25/19   [provider]  Vitamin D, Cholecalciferol, 25 MCG (1000 UT) CAPS Take 1 capsule by mouth daily. 08/04/09   [provider]  Vitamin D, Ergocalciferol, (DRISDOL) 1.25 MG (50000 UNIT) CAPS capsule Take 50,000 Units by mouth once a week. 03/14/20   [provider]    Family History Family History  Problem Relation Age of Onset   Arthritis Maternal Aunt    Heart disease Maternal Aunt    Hypertension Maternal Aunt    Diabetes Maternal Aunt    Colon cancer Neg Hx    Kidney disease Neg Hx    Liver disease Neg Hx    Throat cancer Neg Hx    Stomach cancer Neg Hx    Colon polyps  Neg Hx     Social History Social History   Tobacco Use   Smoking status: Former    Packs/day: 0.80    Years: 45.00    Pack years: 36.00    Types: Cigarettes    Quit  date: 08/23/2009    Years since quitting: 12.1   Smokeless tobacco: Never  Vaping Use   Vaping Use: Never used  Substance Use Topics   Alcohol use: Yes    Alcohol/week: 2.0 standard drinks    Types: 2 Glasses of wine per week   Drug use: Never     Allergies   Atorvastatin   Review of Systems Review of Systems Per HPI  Physical Exam Triage Vital Signs ED Triage Vitals [10/21/21 0836]  Enc Vitals Group     BP (!) 146/72     Pulse Rate 86     Resp 18     Temp 98.3 F (36.8 C)     Temp Source Oral     SpO2 100 %     Weight      Height      Head Circumference      Peak Flow      Pain Score 4     Pain Loc      Pain Edu?      Excl. in [place]?    No data found.  Updated Vital Signs BP (!) 146/72 (BP Location: Left Arm)    Pulse 86    Temp 98.3 F (36.8 C) (Oral)    Resp 18    SpO2 100%   Visual Acuity Right Eye Distance:   Left Eye Distance:   Bilateral Distance:    Right Eye Near:   Left Eye Near:    Bilateral Near:     Physical Exam Vitals and nursing note reviewed.  Constitutional:      General: She is not in acute distress.    Appearance: Normal appearance. She is not toxic-appearing.  HENT:     Head: Normocephalic and atraumatic.     Mouth/Throat:     Mouth: Mucous membranes are moist.     Pharynx: Oropharynx is clear.  Eyes:     General: No scleral icterus.    Extraocular Movements: Extraocular movements intact.  Neck:      Comments: Tenderness in the area marked.  No redness, mass, warmth.  Cardiovascular:     Rate and Rhythm: Normal rate and regular rhythm.  Pulmonary:     Effort: Pulmonary effort is normal. No respiratory distress.     Breath sounds: Normal breath sounds. No wheezing, rhonchi or rales.  Abdominal:     General: Abdomen is flat. Bowel sounds are normal.  There is no distension.     Palpations: Abdomen is soft.     Tenderness: There is no abdominal tenderness. There is no right CVA tenderness or left CVA tenderness.     Hernia: A hernia is present.  Musculoskeletal:     Cervical back: Normal range of motion. Tenderness present.     Thoracic back: Tenderness present.       Back:     Comments: Tenderness in area marked  Skin:    General: Skin is warm and dry.     Coloration: Skin is not jaundiced or pale.     Findings: No erythema.  Neurological:     Mental Status: She is alert and oriented to person, place, and time.     Motor: No weakness.     Gait: Gait normal.  Psychiatric:        Mood and Affect: Mood normal.        Behavior: Behavior normal.        Thought Content: Thought content normal.  Judgment: Judgment normal.     UC Treatments / Results  Labs (all labs ordered are listed, but only abnormal results are displayed) Labs Reviewed - No data to display  EKG   Radiology DG Chest 2 View  Result Date: 10/21/2021 CLINICAL DATA:  Left-sided neck pain. EXAM: CHEST - 2 VIEW COMPARISON:  07/27/2021 FINDINGS: Lungs are hyperinflated as can be seen with COPD. Stable 8 mm right upper lobe pulmonary nodule. No focal consolidation. Biapical pleuroparenchymal scarring. No pleural effusion or pneumothorax. Heart and mediastinal contours are unremarkable. Prior CABG. No acute osseous abnormality. IMPRESSION: No active cardiopulmonary disease. Electronically Signed   By: Kathreen Devoid M.D.   On: 10/21/2021 09:18    Procedures Procedures (including critical care time)  Medications Ordered in UC Medications - No data to display  Initial Impression / Assessment and Plan / UC Course  I have reviewed the triage vital signs and the nursing notes.  Pertinent labs & imaging results that were available during my care of the patient were reviewed by me and considered in my medical decision making (see chart for details).    Chest  x-ray and EKG today are reassuring and do not show acute abnormality.  Her examination is consistent with history of known arthritis and possibly muscle spasms.  Encouraged Tylenol 500 mg up to four times daily for pain and use of muscle relaxant sparingly if Tylenol does not help.  I encouraged her to follow up with her PCP if her pain persists.  I also suspect a degree of acid reflux and possible hiatal hernia.  Start pantoprazole 40 mg daily before meals and follow up with GI if symptoms do not improve.   Final Clinical Impressions(s) / UC Diagnoses   Final diagnoses:  Neck pain  Subacute cough  Musculoskeletal chest pain     Discharge Instructions      The chest x-ray today does not show any pneumonia.  Your EKG looks similar when compared to previous EKGs.  Please continue the medication you already have for arthritis pain and muscle pain.  If your pain persists, please follow up with your primary care provider.  Please start pantoprazole 40 mg daily before breakfast and take for 8 weeks.  If your acid reflux symptoms persist despite this medication, please reach out to the stomach doctor to make an appointment.      ED Prescriptions     Medication Sig Dispense Auth. Provider   pantoprazole (PROTONIX) 40 MG tablet Take 1 tablet (40 mg total) by mouth daily before breakfast. 60 tablet Eulogio Bear, NP      PDMP not reviewed this encounter.   Eulogio Bear, NP 10/21/21 [id number]

## 2021-11-02 ENCOUNTER — Other Ambulatory Visit: Payer: Self-pay

## 2021-11-02 ENCOUNTER — Encounter: Payer: Self-pay | Admitting: Thoracic Surgery (Cardiothoracic Vascular Surgery)

## 2021-11-02 ENCOUNTER — Ambulatory Visit: Payer: Medicare Other | Admitting: Thoracic Surgery (Cardiothoracic Vascular Surgery)

## 2021-11-02 ENCOUNTER — Other Ambulatory Visit: Payer: Self-pay | Admitting: Thoracic Surgery (Cardiothoracic Vascular Surgery)

## 2021-11-02 DIAGNOSIS — R911 Solitary pulmonary nodule: Secondary | ICD-10-CM | POA: Insufficient documentation

## 2021-11-02 NOTE — Progress Notes (Signed)
° °   °[address] °      ,[place] [address] °            [phone]   ° °  ° °HPI: [name] returns for a scheduled follow-up visit ° °[name] is a 76-year-old woman with a history of tobacco abuse, COPD, lung nodules, CAD, CABG in 2012, hypertension, thoracic aortic atherosclerosis and gastroesophageal reflux. ° °She was first found to have abnormalities on his CT of the chest in 2011.  She had bilateral apical pleural-parenchymal densities.  They were not hypermetabolic on PET.  She has had multiple other lung nodules that we have followed over the years.  I last saw her in September 2022.  There was minimal change in a posterior right upper lobe lung nodule.  It was not much different than it had been a year prior. ° °In the interim since her last visit she has been having some issues with shortness of breath.  She was in the emergency room urgent care recently.  She was started on a new inhaler.  An ECG showed no acute ischemia.  She also having trouble with pain in her right knee. ° ° °Past Medical History:  °Diagnosis Date  ° Anemia   ° Anxiety   ° hx  ° Arthritis   ° "my whole body"  ° Atrial septal aneurysm   ° COLONIC POLYPS, HX OF   ° COPD (chronic obstructive pulmonary disease) (HCC)   ° no per pt on 03/21/2014 & 02/06/2018  ° Coronary artery disease   ° a. s/p CABG 2012.  ° DISC DISEASE, CERVICAL   ° Gastroesophageal reflux disease   ° Headache   ° "I have headaches all the time" (02/06/2018)  ° Heart murmur   ° as a child  ° History of rheumatic fever   ° Hyperlipidemia   ° Hypertension   ° Hypothyroidism   ° Insomnia   ° Osteoarthritis   ° OSTEOPENIA   ° PAD (peripheral artery disease) (HCC)   ° a. PTA to R SFA 2015. b. PTA to L SFA 05/2016.  ° Plantar fasciitis of left foot 10/02/2019  ° Pre-diabetes   ° RSD (reflex sympathetic dystrophy) 10/31/2011  ° Vitamin D deficiency   ° ° °Current Outpatient Medications  °Medication Sig Dispense Refill  ° albuterol (VENTOLIN HFA) 108  (90 Base) MCG/ACT inhaler INHALE 1 PUFF BY MOUTH EVERY 6 HOURS AS NEEDED FOR SHORTNESS OF BREATH AND FOR WHEEZING    ° alclomethasone (ACLOVATE) 0.05 % cream Apply 1 application topically as needed.     ° amLODipine (NORVASC) 10 MG tablet TAKE ONE TABLET BY MOUTH ONCE DAILY 90 tablet 1  ° aspirin EC 81 MG tablet Take 81 mg by mouth daily.    ° BINAXNOW COVID-19 AG HOME TEST KIT Use as Directed on the Package    ° Calcium Carb-Cholecalciferol (CALCIUM + D3) 600-800 MG-UNIT TABS Take 1 tablet by mouth 2 (two) times daily.    ° clopidogrel (PLAVIX) 75 MG tablet daily.    ° CONSTULOSE 10 GM/15ML solution TAKE 30 ML BY MOUTH TWICE DAILY AS NEEDED    ° cyclobenzaprine (FLEXERIL) 10 MG tablet Take 10 mg by mouth at bedtime as needed.    ° DULoxetine (CYMBALTA) 30 MG capsule Take by mouth daily.    ° escitalopram (LEXAPRO) 5 MG tablet one po am daily as directed    ° EUTHYROX 100 MCG tablet Take 100 mcg by mouth every   every morning.     ezetimibe (ZETIA) 10 MG tablet Take 10 mg by mouth daily.     fluticasone (FLONASE) 50 MCG/ACT nasal spray USE 1 TO 2 SPRAY(S) IN EACH NOSTRIL ONCE DAILY AS NEEDED FOR 30 DAYS     gabapentin (NEURONTIN) 100 MG capsule Take 1 capsule (100 mg total) by mouth 3 (three) times daily. Take 1 pill in AM- and 2 pills at night. - for nerve pain 90 capsule 5   hydrOXYzine (ATARAX/VISTARIL) 25 MG tablet Take 25 mg by mouth 3 (three) times daily.      Lancets (ONETOUCH DELICA PLUS [id number]) MISC 1 each by Other route 2 (two) times daily.     levothyroxine (SYNTHROID, LEVOTHROID) 150 MCG tablet Take 150 mcg by mouth daily before breakfast.     meloxicam (MOBIC) 7.5 MG tablet Take 7.5 mg by mouth daily.     Multiple Vitamin (MULTIVITAMIN WITH MINERALS) TABS tablet Take 1 tablet by mouth every morning. Centrum     nystatin cream (MYCOSTATIN) Apply topically.     ONETOUCH VERIO test strip 1 each by Other route 2 (two) times daily.     pantoprazole (PROTONIX) 40 MG tablet Take 1 tablet (40 mg total) by  mouth daily before breakfast. 60 tablet 1   Propylene Glycol 0.6 % SOLN Place 1 drop into both eyes every morning.     triamcinolone ointment (KENALOG) 0.1 %      Vitamin D, Cholecalciferol, 25 MCG (1000 UT) CAPS Take 1 capsule by mouth daily.     Vitamin D, Ergocalciferol, (DRISDOL) 1.25 MG (50000 UNIT) CAPS capsule Take 50,000 Units by mouth once a week.     Current Facility-Administered Medications  Medication Dose Route Frequency Provider Last Rate Last Admin   0.9 %  sodium chloride infusion  500 mL Intravenous Once Milus Banister, MD       Facility-Administered Medications Ordered in Other Visits  Medication Dose Route Frequency Provider Last Rate Last Admin   tranexamic acid (CYKLOKAPRON) 2,000 mg in sodium chloride 0.9 % 50 mL Topical Application  1,791 mg Topical Once [place], [employer], PA-C        Physical Exam BP 137/76 (BP Location: Left Arm, Patient Position: Sitting, Cuff Size: Normal)    Pulse 89    Resp 20    Ht 5' 3" (1.6 m)    Wt 134 lb 6.4 oz (61 kg)    SpO2 93% Comment: RA   BMI 23.22 kg/m  76 year old woman in no acute distress Alert and oriented x3 with no focal deficits Lungs clear bilaterally with no wheezing  No cervical or supraclavicular adenopathy Cardiac regular rate and rhythm, no murmur No peripheral edema  Diagnostic Tests: CT CHEST WITHOUT CONTRAST   TECHNIQUE: Multidetector CT imaging of the chest was performed following the standard protocol without IV contrast.   RADIATION DOSE REDUCTION: This exam was performed according to the departmental dose-optimization program which includes automated exposure control, adjustment of the mA and/or kV according to patient size and/or use of iterative reconstruction technique.   COMPARISON:  Chest CT 03/05/2021.   FINDINGS: Cardiovascular: Heart size is normal. There is no significant pericardial fluid, thickening or pericardial calcification. There is aortic atherosclerosis, as well as  atherosclerosis of the great vessels of the mediastinum and the coronary arteries, including calcified atherosclerotic plaque in the left main, left anterior descending, left circumflex and right coronary arteries. Status post median sternotomy for CABG including LIMA to the LAD.   Mediastinum/Nodes: No pathologically  mediastinal or hilar °lymph nodes. Please note that accurate exclusion of hilar adenopathy °is limited on noncontrast CT scans. Esophagus diffusely patulous but °otherwise unremarkable in appearance. No axillary lymphadenopathy. °  °Lungs/Pleura: 6 x 9 mm macrolobulated slightly spiculated nodule in °the posterior aspect of the right lower lobe (axial image 56 of °series 8) similar to the prior examination in overall size, but °slightly more bulky in appearance, particularly in the inferior °aspect of the lesion which has a more rounded appearance. Persistent °area of ill-defined ground-glass attenuation in the medial aspect of °the left upper lobe, within which there is a 9 mm solid-appearing °nodule (axial image 31 of series 8) which is more conspicuous than °the prior examination. Bilateral apical nodular pleuroparenchymal °thickening and architectural distortion, most compatible with areas °of chronic post infectious or inflammatory fibrosis. A few other °scattered smaller 2-4 mm pulmonary nodules are noted elsewhere in °the lungs bilaterally, nonspecific, but stable compared to prior °studies and favored to be benign. No pleural effusions. Diffuse °bronchial wall thickening with moderate centrilobular and paraseptal °emphysema. °  °Upper Abdomen: Aortic atherosclerosis. Low-attenuation lesions in °the liver, similar to the prior study, incompletely characterized on °today's non-contrast CT examination, but statistically likely to °represent cysts, largest of which measures 2.3 x 1.6 cm between °segments 6 and 7. Nonobstructive calculi are noted within both renal °collecting systems,  largest of which measures up to 5 mm in the °upper pole collecting system of the left kidney. °  °Musculoskeletal: Median sternotomy wires. There are no aggressive °appearing lytic or blastic lesions noted in the visualized portions °of the skeleton. °  °IMPRESSION: °1. Bilateral pulmonary nodules are slightly more conspicuous than °prior examinations, concerning for potential slow-growing pulmonary °neoplasms. Further evaluation with PET-CT is recommended in the near °future to better evaluate these findings. °2. Aortic atherosclerosis, in addition to left main and three-vessel °coronary artery disease. Status post median sternotomy for CABG °including LIMA to the LAD. °3. Mild diffuse bronchial wall thickening with moderate °centrilobular and paraseptal emphysema; imaging findings suggestive °of underlying COPD. °  °Aortic Atherosclerosis (ICD10-I70.0) and Emphysema (ICD10-J43.9). °  °  °Electronically Signed °  By: Daniel  Entrikin M.D. °  On: 10/01/2021 12:42 °I personally reviewed the CT images.  I do not see much difference in the right upper lobe or left upper lobe nodules. ° °Impression: °[name] is a 76-year-old woman with a history of tobacco abuse, COPD, lung nodules, CAD, CABG in 2012, hypertension, thoracic aortic atherosclerosis and gastroesophageal reflux. ° °He been followed for over 10 years regarding multiple abnormalities in the lungs.  Several of these are suspicious for potential low-grade lung cancers but none of ever acted aggressively.  Some of the nodules have waxed and waned over time.  More recently there is a right upper lobe nodule that has slowly increased in size.  Not sure there is really any significant difference from 6 months ago but if you go back couple of years has been definite growth.  This nodule does have some solid component and is likely large enough to show up on pad so I think that would be our next step to help guide our initial diagnostic work-up. ° °Shortness  of breath-COPD.  Recently started on new inhaler. ° °Tobacco abuse-quit smoking in 2011. ° °Coronary and thoracic aortic atherosclerosis, status post CABG-no anginal symptoms.  Allergic to statins.  She is on Zetia. ° °Plan: °We will plan PET/CT to further evaluate lung nodules and guide our initial diagnostic work-up. °  work-up. Have CT from February converted to super D protocol in case navigational bronchoscopy is needed. Return after PET/CT.  Melrose Nakayama, MD [facility] [phone]

## 2021-11-03 ENCOUNTER — Other Ambulatory Visit: Payer: Self-pay | Admitting: Orthopedic Surgery

## 2021-11-03 ENCOUNTER — Other Ambulatory Visit (HOSPITAL_COMMUNITY): Payer: Self-pay | Admitting: Orthopedic Surgery

## 2021-11-03 DIAGNOSIS — T8484XD Pain due to internal orthopedic prosthetic devices, implants and grafts, subsequent encounter: Secondary | ICD-10-CM

## 2021-11-10 ENCOUNTER — Ambulatory Visit (HOSPITAL_COMMUNITY)
Admission: RE | Admit: 2021-11-10 | Discharge: 2021-11-10 | Disposition: A | Discharge status: Home / Self Care | Payer: Medicare Other | Source: Ambulatory Visit | Attending: Orthopedic Surgery | Admitting: Orthopedic Surgery

## 2021-11-10 ENCOUNTER — Encounter (HOSPITAL_COMMUNITY): Payer: Medicare Other

## 2021-11-10 ENCOUNTER — Other Ambulatory Visit: Payer: Self-pay

## 2021-11-10 ENCOUNTER — Encounter (HOSPITAL_COMMUNITY): Payer: Self-pay

## 2021-11-10 DIAGNOSIS — T8484XD Pain due to internal orthopedic prosthetic devices, implants and grafts, subsequent encounter: Secondary | ICD-10-CM | POA: Insufficient documentation

## 2021-11-10 MED ORDER — TECHNETIUM TC 99M MEDRONATE IV KIT: 20.0000 | PACK | Freq: Once | INTRAVENOUS | Status: DC | PRN

## 2021-11-18 ENCOUNTER — Ambulatory Visit (HOSPITAL_COMMUNITY)
Admission: RE | Admit: 2021-11-18 | Discharge: 2021-11-18 | Disposition: A | Discharge status: Home / Self Care | Payer: Medicare Other | Source: Ambulatory Visit | Attending: Thoracic Surgery (Cardiothoracic Vascular Surgery) | Admitting: Thoracic Surgery (Cardiothoracic Vascular Surgery)

## 2021-11-18 ENCOUNTER — Other Ambulatory Visit: Payer: Self-pay

## 2021-11-18 DIAGNOSIS — R911 Solitary pulmonary nodule: Secondary | ICD-10-CM | POA: Diagnosis present

## 2021-11-18 LAB — GLUCOSE, CAPILLARY: Glucose-Capillary: 136 mg/dL — ABNORMAL HIGH (ref 70–99)

## 2021-11-18 MED ORDER — FLUDEOXYGLUCOSE F - 18 (FDG) INJECTION
6.4600 | Freq: Once | INTRAVENOUS | Status: AC | PRN
  Administered 2021-11-18: 6.46 via INTRAVENOUS

## 2021-11-24 ENCOUNTER — Encounter: Payer: Self-pay | Admitting: *Deleted

## 2021-11-24 ENCOUNTER — Other Ambulatory Visit: Payer: Self-pay | Admitting: *Deleted

## 2021-11-24 ENCOUNTER — Ambulatory Visit: Payer: Medicare Other | Admitting: Thoracic Surgery (Cardiothoracic Vascular Surgery)

## 2021-11-24 VITALS — BP 139/73 | HR 87 | Resp 20 | Ht 63.0 in | Wt 133.0 lb

## 2021-11-24 DIAGNOSIS — R918 Other nonspecific abnormal finding of lung field: Secondary | ICD-10-CM | POA: Diagnosis not present

## 2021-11-24 DIAGNOSIS — R911 Solitary pulmonary nodule: Secondary | ICD-10-CM

## 2021-11-24 NOTE — Progress Notes (Signed)
? ?   ?[address] ?      [place] [address] ?            [phone]   ? ? ?HPI: [name] returns to discuss the results of her PET/CT. ? ?[name] is a 76 year old woman with a history of tobacco abuse, COPD, lung nodules, CAD, CABG in 2012, hypertension, thoracic aortic atherosclerosis, and reflux.  She has followed for lung nodules for years.  I saw her in March.  Her CT showed some progression in a nodule in the posterior right lung.  There are some question as to whether this is in the posterior segment of the right upper lobe inferiorly or in the superior segment of the lower lobe.  The fissure is very indistinct in that area.  Radiology recommended a PET/CT.  That was done and she returns to discuss the results. ? ?She is feeling well.  She not having any chest pain, pressure, or tightness.  She quit smoking in 2011.  No cough or wheezing.  No change in appetite or weight loss. ? ?Zubrod Score: ?At the time of surgery this patient?s most appropriate activity status/level should be described as: ?_0     0    Normal activity, no symptoms ?_1     1    Restricted in physical strenuous activity but ambulatory, able to do out light work ?_2     2    Ambulatory and capable of self care, unable to do work activities, up and about >50 % of waking hours                              ?_3     3    Only limited self care, in bed greater than 50% of waking hours ?_4     4    Completely disabled, no self care, confined to bed or chair ?_5     5    Moribund ? ?Current Outpatient Medications  ?Medication Sig Dispense Refill  ? albuterol (VENTOLIN HFA) 108 (90 Base) MCG/ACT inhaler INHALE 1 PUFF BY MOUTH EVERY 6 HOURS AS NEEDED FOR SHORTNESS OF BREATH AND FOR WHEEZING    ? alclomethasone (ACLOVATE) 0.05 % cream Apply 1 application topically as needed.     ? amLODipine (NORVASC) 10 MG tablet TAKE ONE TABLET BY MOUTH ONCE DAILY 90 tablet 1  ? aspirin EC 81 MG tablet Take 81 mg by mouth daily.    ?  BINAXNOW COVID-19 AG HOME TEST KIT Use as Directed on the Package    ? Calcium Carb-Cholecalciferol (CALCIUM + D3) 600-800 MG-UNIT TABS Take 1 tablet by mouth 2 (two) times daily.    ? clopidogrel (PLAVIX) 75 MG tablet daily.    ? CONSTULOSE 10 GM/15ML solution TAKE 30 ML BY MOUTH TWICE DAILY AS NEEDED    ? cyclobenzaprine (FLEXERIL) 10 MG tablet Take 10 mg by mouth at bedtime as needed.    ? DULoxetine (CYMBALTA) 30 MG capsule Take by mouth daily.    ? escitalopram (LEXAPRO) 5 MG tablet one po am daily as directed    ? EUTHYROX 100 MCG tablet Take 100 mcg by mouth every morning.    ? ezetimibe (ZETIA) 10 MG tablet Take 10 mg by mouth daily.    ? fluticasone (FLONASE) 50 MCG/ACT nasal spray USE 1 TO 2 SPRAY(S) IN EACH NOSTRIL ONCE DAILY AS NEEDED FOR 30 DAYS    ? gabapentin (NEURONTIN) 100 MG  capsule Take 1 capsule (100 mg total) by mouth 3 (three) times daily. Take 1 pill in AM- and 2 pills at night. - for nerve pain 90 capsule 5  ? hydrOXYzine (ATARAX/VISTARIL) 25 MG tablet Take 25 mg by mouth 3 (three) times daily.     ? Lancets (ONETOUCH DELICA PLUS [id number]) MISC 1 each by Other route 2 (two) times daily.    ? levothyroxine (SYNTHROID, LEVOTHROID) 150 MCG tablet Take 150 mcg by mouth daily before breakfast.    ? meloxicam (MOBIC) 7.5 MG tablet Take 7.5 mg by mouth daily.    ? Multiple Vitamin (MULTIVITAMIN WITH MINERALS) TABS tablet Take 1 tablet by mouth every morning. Centrum    ? nystatin cream (MYCOSTATIN) Apply topically.    ? ONETOUCH VERIO test strip 1 each by Other route 2 (two) times daily.    ? pantoprazole (PROTONIX) 40 MG tablet Take 1 tablet (40 mg total) by mouth daily before breakfast. 60 tablet 1  ? Propylene Glycol 0.6 % SOLN Place 1 drop into both eyes every morning.    ? triamcinolone ointment (KENALOG) 0.1 %     ? Vitamin D, Cholecalciferol, 25 MCG (1000 UT) CAPS Take 1 capsule by mouth daily.    ? Vitamin D, Ergocalciferol, (DRISDOL) 1.25 MG (50000 UNIT) CAPS capsule Take 50,000 Units by  mouth once a week.    ? ?Current Facility-Administered Medications  ?Medication Dose Route Frequency Provider Last Rate Last Admin  ? 0.9 %  sodium chloride infusion  500 mL Intravenous Once Milus Banister, MD      ? ?Facility-Administered Medications Ordered in Other Visits  ?Medication Dose Route Frequency Provider Last Rate Last Admin  ? tranexamic acid (CYKLOKAPRON) 2,000 mg in sodium chloride 0.9 % 50 mL Topical Application  9,983 mg Topical Once [place], [employer], PA-C      ? ? ?Physical Exam ?BP 139/73   Pulse 87   Resp 20   Ht _0  (1.6 m)   Wt 133 lb (60.3 kg)   SpO2 90%   BMI 23.51 kg/m?  ?76 year old woman in no acute distress ?Alert and oriented x3 with no focal motor deficits ?No cervical or supraclavicular adenopathy ?Cardiac regular rate and rhythm with no murmur ?Lungs clear with equal breath sounds bilaterally, no wheezing ? ?Diagnostic Tests: ?NUCLEAR MEDICINE PET SKULL BASE TO THIGH ?  ?TECHNIQUE: ?6.46 mCi F-18 FDG was injected intravenously. Full-ring PET imaging ?was performed from the skull base to thigh after the radiotracer. CT ?data was obtained and used for attenuation correction and anatomic ?localization. ?  ?Fasting blood glucose: 136 mg/dl ?  ?COMPARISON:  Chest CT of October 01, 2021. ?  ?FINDINGS: ?Mediastinal blood pool activity: SUV max 2.27 ?  ?Liver activity: SUV max NA ?  ?NECK: No hypermetabolic lymph nodes in the neck. ?  ?Incidental CT findings: none ?  ?CHEST: Signs of pulmonary emphysema and biapical scarring. ?  ?Nodule of concern in the posterior superior segment RIGHT lower lobe ?with spiculated margins displays a maximum SUV of 2.24. (Image 66/4) ?  ?Mildly spiculated area inferior to signs of apical scarring in the ?LEFT upper lobe at 8 mm on the recent chest CT is unchanged. Maximum ?SUV in this area of 1.55. ?  ?Other smaller pulmonary nodules are stable and without increased ?metabolic activity. ?  ?Incidental CT findings: Calcified atheromatous plaque of  the ?thoracic aorta. Post median sternotomy for CABG. Extensive coronary ?artery calcification of native coronary vessels. No adenopathy by ?size criteria  in the chest and no signs of hypermetabolic lymph ?nodes in the chest. Patulous esophagus similar to prior imaging. ?  ?ABDOMEN/PELVIS: No abnormal hypermetabolic activity within the ?liver, pancreas, adrenal glands, or spleen. No hypermetabolic lymph ?nodes in the abdomen or pelvis. ?  ?Incidental CT findings: Cysts in the liver. No acute findings ?relative to pancreas, spleen and adrenal glands. ?  ?Nephrolithiasis of the bilateral kidneys largest in the upper ?pole/interpolar aspect of the LEFT kidney approximately 4-5 mm. ?Largest on the RIGHT approximately 6 mm in the lower pole. No ?hydronephrosis or perinephric stranding. No acute gastrointestinal ?process. Signs of colonic diverticulosis including RIGHT-sided ?diverticular changes. Post hysterectomy without adnexal mass. Aortic ?atherosclerosis without aneurysm. There is no gastrohepatic or ?hepatoduodenal ligament lymphadenopathy. No retroperitoneal or ?mesenteric lymphadenopathy. ?  ?No pelvic sidewall lymphadenopathy. ?  ?SKELETON: No focal hypermetabolic activity to suggest skeletal ?metastasis. Areas of activity in the cervical spine associated with ?extensive cervical spine degenerative changes. ?  ?Incidental CT findings: Extensive spinal degenerative changes. ?  ?IMPRESSION: ?RIGHT lower lobe pulmonary nodule in the posterosuperior chest with ?mild increased metabolic activity and irregular margins, suspicious ?for small bronchogenic neoplasm. ?  ?Nodular area just inferior to apical scarring separated from more ?confluent areas of apical scarring with some enlargement over time ?is indeterminate and without substantial metabolic activity. Suggest ?attention on follow-up. ?  ?Scattered smaller pulmonary nodules without increased metabolic ?activity. ?  ?No signs of nodal disease. ?   ?Nephrolithiasis. ?  ?Aortic atherosclerosis. ?  ?  ?Electronically Signed ?  By: Zetta Bills M.D. ?  On: 11/18/2021 16:24 ?I personally reviewed the PET/CT images.  Concur with the findings that there is metabolic activity in

## 2021-11-24 NOTE — H&P (View-Only) (Signed)
? ?   ?[address] ?      [place] [address] ?            [phone]   ? ? ?HPI: [name] returns to discuss the results of her PET/CT. ? ?[name] is a 76 year old woman with a history of tobacco abuse, COPD, lung nodules, CAD, CABG in 2012, hypertension, thoracic aortic atherosclerosis, and reflux.  She has followed for lung nodules for years.  I saw her in March.  Her CT showed some progression in a nodule in the posterior right lung.  There are some question as to whether this is in the posterior segment of the right upper lobe inferiorly or in the superior segment of the lower lobe.  The fissure is very indistinct in that area.  Radiology recommended a PET/CT.  That was done and she returns to discuss the results. ? ?She is feeling well.  She not having any chest pain, pressure, or tightness.  She quit smoking in 2011.  No cough or wheezing.  No change in appetite or weight loss. ? ?Zubrod Score: ?At the time of surgery this patient?s most appropriate activity status/level should be described as: ?_0     0    Normal activity, no symptoms ?_1     1    Restricted in physical strenuous activity but ambulatory, able to do out light work ?_2     2    Ambulatory and capable of self care, unable to do work activities, up and about >50 % of waking hours                              ?_3     3    Only limited self care, in bed greater than 50% of waking hours ?_4     4    Completely disabled, no self care, confined to bed or chair ?_5     5    Moribund ? ?Current Outpatient Medications  ?Medication Sig Dispense Refill  ? albuterol (VENTOLIN HFA) 108 (90 Base) MCG/ACT inhaler INHALE 1 PUFF BY MOUTH EVERY 6 HOURS AS NEEDED FOR SHORTNESS OF BREATH AND FOR WHEEZING    ? alclomethasone (ACLOVATE) 0.05 % cream Apply 1 application topically as needed.     ? amLODipine (NORVASC) 10 MG tablet TAKE ONE TABLET BY MOUTH ONCE DAILY 90 tablet 1  ? aspirin EC 81 MG tablet Take 81 mg by mouth daily.    ?  BINAXNOW COVID-19 AG HOME TEST KIT Use as Directed on the Package    ? Calcium Carb-Cholecalciferol (CALCIUM + D3) 600-800 MG-UNIT TABS Take 1 tablet by mouth 2 (two) times daily.    ? clopidogrel (PLAVIX) 75 MG tablet daily.    ? CONSTULOSE 10 GM/15ML solution TAKE 30 ML BY MOUTH TWICE DAILY AS NEEDED    ? cyclobenzaprine (FLEXERIL) 10 MG tablet Take 10 mg by mouth at bedtime as needed.    ? DULoxetine (CYMBALTA) 30 MG capsule Take by mouth daily.    ? escitalopram (LEXAPRO) 5 MG tablet one po am daily as directed    ? EUTHYROX 100 MCG tablet Take 100 mcg by mouth every morning.    ? ezetimibe (ZETIA) 10 MG tablet Take 10 mg by mouth daily.    ? fluticasone (FLONASE) 50 MCG/ACT nasal spray USE 1 TO 2 SPRAY(S) IN EACH NOSTRIL ONCE DAILY AS NEEDED FOR 30 DAYS    ? gabapentin (NEURONTIN) 100 MG  capsule Take 1 capsule (100 mg total) by mouth 3 (three) times daily. Take 1 pill in AM- and 2 pills at night. - for nerve pain 90 capsule 5  ? hydrOXYzine (ATARAX/VISTARIL) 25 MG tablet Take 25 mg by mouth 3 (three) times daily.     ? Lancets (ONETOUCH DELICA PLUS [id number]) MISC 1 each by Other route 2 (two) times daily.    ? levothyroxine (SYNTHROID, LEVOTHROID) 150 MCG tablet Take 150 mcg by mouth daily before breakfast.    ? meloxicam (MOBIC) 7.5 MG tablet Take 7.5 mg by mouth daily.    ? Multiple Vitamin (MULTIVITAMIN WITH MINERALS) TABS tablet Take 1 tablet by mouth every morning. Centrum    ? nystatin cream (MYCOSTATIN) Apply topically.    ? ONETOUCH VERIO test strip 1 each by Other route 2 (two) times daily.    ? pantoprazole (PROTONIX) 40 MG tablet Take 1 tablet (40 mg total) by mouth daily before breakfast. 60 tablet 1  ? Propylene Glycol 0.6 % SOLN Place 1 drop into both eyes every morning.    ? triamcinolone ointment (KENALOG) 0.1 %     ? Vitamin D, Cholecalciferol, 25 MCG (1000 UT) CAPS Take 1 capsule by mouth daily.    ? Vitamin D, Ergocalciferol, (DRISDOL) 1.25 MG (50000 UNIT) CAPS capsule Take 50,000 Units by  mouth once a week.    ? ?Current Facility-Administered Medications  ?Medication Dose Route Frequency Provider Last Rate Last Admin  ? 0.9 %  sodium chloride infusion  500 mL Intravenous Once Milus Banister, MD      ? ?Facility-Administered Medications Ordered in Other Visits  ?Medication Dose Route Frequency Provider Last Rate Last Admin  ? tranexamic acid (CYKLOKAPRON) 2,000 mg in sodium chloride 0.9 % 50 mL Topical Application  9,983 mg Topical Once [place], [employer], PA-C      ? ? ?Physical Exam ?BP 139/73   Pulse 87   Resp 20   Ht _0  (1.6 m)   Wt 133 lb (60.3 kg)   SpO2 90%   BMI 23.51 kg/m?  ?76 year old woman in no acute distress ?Alert and oriented x3 with no focal motor deficits ?No cervical or supraclavicular adenopathy ?Cardiac regular rate and rhythm with no murmur ?Lungs clear with equal breath sounds bilaterally, no wheezing ? ?Diagnostic Tests: ?NUCLEAR MEDICINE PET SKULL BASE TO THIGH ?  ?TECHNIQUE: ?6.46 mCi F-18 FDG was injected intravenously. Full-ring PET imaging ?was performed from the skull base to thigh after the radiotracer. CT ?data was obtained and used for attenuation correction and anatomic ?localization. ?  ?Fasting blood glucose: 136 mg/dl ?  ?COMPARISON:  Chest CT of October 01, 2021. ?  ?FINDINGS: ?Mediastinal blood pool activity: SUV max 2.27 ?  ?Liver activity: SUV max NA ?  ?NECK: No hypermetabolic lymph nodes in the neck. ?  ?Incidental CT findings: none ?  ?CHEST: Signs of pulmonary emphysema and biapical scarring. ?  ?Nodule of concern in the posterior superior segment RIGHT lower lobe ?with spiculated margins displays a maximum SUV of 2.24. (Image 66/4) ?  ?Mildly spiculated area inferior to signs of apical scarring in the ?LEFT upper lobe at 8 mm on the recent chest CT is unchanged. Maximum ?SUV in this area of 1.55. ?  ?Other smaller pulmonary nodules are stable and without increased ?metabolic activity. ?  ?Incidental CT findings: Calcified atheromatous plaque of  the ?thoracic aorta. Post median sternotomy for CABG. Extensive coronary ?artery calcification of native coronary vessels. No adenopathy by ?size criteria  in the chest and no signs of hypermetabolic lymph ?nodes in the chest. Patulous esophagus similar to prior imaging. ?  ?ABDOMEN/PELVIS: No abnormal hypermetabolic activity within the ?liver, pancreas, adrenal glands, or spleen. No hypermetabolic lymph ?nodes in the abdomen or pelvis. ?  ?Incidental CT findings: Cysts in the liver. No acute findings ?relative to pancreas, spleen and adrenal glands. ?  ?Nephrolithiasis of the bilateral kidneys largest in the upper ?pole/interpolar aspect of the LEFT kidney approximately 4-5 mm. ?Largest on the RIGHT approximately 6 mm in the lower pole. No ?hydronephrosis or perinephric stranding. No acute gastrointestinal ?process. Signs of colonic diverticulosis including RIGHT-sided ?diverticular changes. Post hysterectomy without adnexal mass. Aortic ?atherosclerosis without aneurysm. There is no gastrohepatic or ?hepatoduodenal ligament lymphadenopathy. No retroperitoneal or ?mesenteric lymphadenopathy. ?  ?No pelvic sidewall lymphadenopathy. ?  ?SKELETON: No focal hypermetabolic activity to suggest skeletal ?metastasis. Areas of activity in the cervical spine associated with ?extensive cervical spine degenerative changes. ?  ?Incidental CT findings: Extensive spinal degenerative changes. ?  ?IMPRESSION: ?RIGHT lower lobe pulmonary nodule in the posterosuperior chest with ?mild increased metabolic activity and irregular margins, suspicious ?for small bronchogenic neoplasm. ?  ?Nodular area just inferior to apical scarring separated from more ?confluent areas of apical scarring with some enlargement over time ?is indeterminate and without substantial metabolic activity. Suggest ?attention on follow-up. ?  ?Scattered smaller pulmonary nodules without increased metabolic ?activity. ?  ?No signs of nodal disease. ?   ?Nephrolithiasis. ?  ?Aortic atherosclerosis. ?  ?  ?Electronically Signed ?  By: Zetta Bills M.D. ?  On: 11/18/2021 16:24 ?I personally reviewed the PET/CT images.  Concur with the findings that there is metabolic activity in

## 2021-11-24 NOTE — Patient Instructions (Signed)
Stop Plavix (clopidogrel) 5 days prior to surgery ?

## 2021-11-25 ENCOUNTER — Encounter (HOSPITAL_COMMUNITY)
Admission: RE | Admit: 2021-11-25 | Discharge: 2021-11-25 | Disposition: A | Discharge status: Home / Self Care | Payer: Medicare Other | Source: Ambulatory Visit | Attending: Orthopedic Surgery | Admitting: Orthopedic Surgery

## 2021-11-25 DIAGNOSIS — T8484XD Pain due to internal orthopedic prosthetic devices, implants and grafts, subsequent encounter: Secondary | ICD-10-CM | POA: Insufficient documentation

## 2021-11-25 MED ORDER — TECHNETIUM TC 99M MEDRONATE IV KIT
20.7000 | PACK | Freq: Once | INTRAVENOUS | Status: AC
  Administered 2021-11-25: 20.7 via INTRAVENOUS

## 2021-11-30 ENCOUNTER — Other Ambulatory Visit (HOSPITAL_COMMUNITY): Payer: Medicare Other

## 2021-12-08 NOTE — Pre-Procedure Instructions (Signed)
Surgical Instructions ? ? ? Your procedure is scheduled on Friday 12/11/21. ? ? Report to [facility] Main Entrance "A" at 11:15 A.M., then check in with the Admitting office. ? Call this number if you have problems the morning of surgery: ? [phone] ? ? If you have any questions prior to your surgery date call [phone]: Open Monday-Friday 8am-4pm ? ? ? Remember: ? Do not eat or drink after midnight the night before your surgery ? ?  ? Take these medicines the morning of surgery with A SIP OF WATER:  ? amLODipine (NORVASC)  ? ezetimibe (ZETIA) ? levothyroxine (SYNTHROID) ? pantoprazole (PROTONIX)  ? ? Take these medicines if needed:  ? albuterol (VENTOLIN HFA) 108 (90 Base)  ? fluticasone (FLONASE)  ? Propylene Glycol  ? ? ?As of today, STOP taking any Aspirin (unless otherwise instructed by your surgeon) meloxicam (MOBIC), Aleve, Naproxen, Ibuprofen, Motrin, Advil, Goody's, BC's, all herbal medications, fish oil, and all vitamins. ? ?         ?Do not wear jewelry or makeup ?Do not wear lotions, powders, perfumes/colognes, or deodorant. ?Do not shave 48 hours prior to surgery.  Men may shave face and neck. ?Do not bring valuables to the hospital. ?Do not wear nail polish, gel polish, artificial nails, or any other type of covering on natural nails (fingers and toes) ?If you have artificial nails or gel coating that need to be removed by a nail salon, please have this removed prior to surgery. Artificial nails or gel coating may interfere with anesthesia's ability to adequately monitor your vital signs. ? ?[facility] is not responsible for any belongings or valuables. .  ? ?Do NOT Smoke (Tobacco/Vaping)  24 hours prior to your procedure ? ?If you use a CPAP at night, you may bring your mask for your overnight stay. ?  ?Contacts, glasses, hearing aids, dentures or partials may not be worn into surgery, please bring cases for these belongings ?  ?For patients admitted to the hospital, discharge time will be  determined by your treatment team. ?  ?Patients discharged the day of surgery will not be allowed to drive home, and someone needs to stay with them for 24 hours. ? ? ?SURGICAL WAITING ROOM VISITATION ?Patients having surgery or a procedure in a hospital may have two support people. ?Children under the age of 26 must have an adult with them who is not the patient. ?They may stay in the waiting area during the procedure and may switch out with other visitors. If the patient needs to stay at the hospital during part of their recovery, the visitor guidelines for inpatient rooms apply. ? ?Please refer to the [facility] website for the visitor guidelines for Inpatients (after your surgery is over and you are in a regular room).  ? ? ? ? ? ?Special instructions:   ? ?Oral Hygiene is also important to reduce your risk of infection.  Remember - BRUSH YOUR TEETH THE MORNING OF SURGERY WITH YOUR REGULAR TOOTHPASTE ? ? ?[facility]- Preparing For Surgery ? ?Before surgery, you can play an important role. Because skin is not sterile, your skin needs to be as free of germs as possible. You can reduce the number of germs on your skin by washing with CHG (chlorahexidine gluconate) Soap before surgery.  CHG is an antiseptic cleaner which kills germs and bonds with the skin to continue killing germs even after washing.   ? ? ?Please do not use if you have an allergy  to CHG or antibacterial soaps. If your skin becomes reddened/irritated stop using the CHG.  ?Do not shave (including legs and underarms) for at least 48 hours prior to first CHG shower. It is OK to shave your face. ? ?Please follow these instructions carefully. ?  ? ? Shower the NIGHT BEFORE SURGERY and the MORNING OF SURGERY with CHG Soap.  ? If you chose to wash your hair, wash your hair first as usual with your normal shampoo. After you shampoo, rinse your hair and body thoroughly to remove the shampoo.  Then [employer] and genitals (private parts) with your normal  soap and rinse thoroughly to remove soap. ? ?After that Use CHG Soap as you would any other liquid soap. You can apply CHG directly to the skin and wash gently with a scrungie or a clean washcloth.  ? ?Apply the CHG Soap to your body ONLY FROM THE NECK DOWN.  Do not use on open wounds or open sores. Avoid contact with your eyes, ears, mouth and genitals (private parts). Wash Face and genitals (private parts)  with your normal soap.  ? ?Wash thoroughly, paying special attention to the area where your surgery will be performed. ? ?Thoroughly rinse your body with warm water from the neck down. ? ?DO NOT shower/wash with your normal soap after using and rinsing off the CHG Soap. ? ?Pat yourself dry with a CLEAN TOWEL. ? ?Wear CLEAN PAJAMAS to bed the night before surgery ? ?Place CLEAN SHEETS on your bed the night before your surgery ? ?DO NOT SLEEP WITH PETS. ? ? ?Day of Surgery: ? ?Take a shower with CHG soap. ?Wear Clean/Comfortable clothing the morning of surgery ?Do not apply any deodorants/lotions.   ?Remember to brush your teeth WITH YOUR REGULAR TOOTHPASTE. ? ? ? ?If you received a COVID test during your pre-op visit  it is requested that you wear a mask when out in public, stay away from anyone that may not be feeling well and notify your surgeon if you develop symptoms. If you have been in contact with anyone that has tested positive in the last 10 days please notify you surgeon. ? ?  ?Please read over the following fact sheets that you were given.  ? ?

## 2021-12-09 ENCOUNTER — Encounter (HOSPITAL_COMMUNITY)
Admission: RE | Admit: 2021-12-09 | Discharge: 2021-12-09 | Disposition: A | Discharge status: Home / Self Care | Payer: Medicare Other | Source: Ambulatory Visit | Attending: Thoracic Surgery (Cardiothoracic Vascular Surgery) | Admitting: Thoracic Surgery (Cardiothoracic Vascular Surgery)

## 2021-12-09 ENCOUNTER — Other Ambulatory Visit: Payer: Self-pay

## 2021-12-09 ENCOUNTER — Ambulatory Visit (HOSPITAL_COMMUNITY)
Admission: RE | Admit: 2021-12-09 | Discharge: 2021-12-09 | Disposition: A | Discharge status: Home / Self Care | Payer: Medicare Other | Source: Ambulatory Visit | Attending: Thoracic Surgery (Cardiothoracic Vascular Surgery) | Admitting: Thoracic Surgery (Cardiothoracic Vascular Surgery)

## 2021-12-09 ENCOUNTER — Encounter (HOSPITAL_COMMUNITY): Payer: Self-pay

## 2021-12-09 VITALS — BP 153/79 | HR 90 | Temp 97.8°F | Resp 19 | Ht 63.0 in | Wt 137.0 lb

## 2021-12-09 DIAGNOSIS — Z20822 Contact with and (suspected) exposure to covid-19: Secondary | ICD-10-CM | POA: Insufficient documentation

## 2021-12-09 DIAGNOSIS — R911 Solitary pulmonary nodule: Secondary | ICD-10-CM | POA: Insufficient documentation

## 2021-12-09 DIAGNOSIS — Z87891 Personal history of nicotine dependence: Secondary | ICD-10-CM | POA: Insufficient documentation

## 2021-12-09 DIAGNOSIS — K219 Gastro-esophageal reflux disease without esophagitis: Secondary | ICD-10-CM | POA: Insufficient documentation

## 2021-12-09 DIAGNOSIS — D649 Anemia, unspecified: Secondary | ICD-10-CM | POA: Insufficient documentation

## 2021-12-09 DIAGNOSIS — Z7902 Long term (current) use of antithrombotics/antiplatelets: Secondary | ICD-10-CM | POA: Insufficient documentation

## 2021-12-09 DIAGNOSIS — I2510 Atherosclerotic heart disease of native coronary artery without angina pectoris: Secondary | ICD-10-CM | POA: Insufficient documentation

## 2021-12-09 DIAGNOSIS — R0609 Other forms of dyspnea: Secondary | ICD-10-CM | POA: Insufficient documentation

## 2021-12-09 DIAGNOSIS — R7303 Prediabetes: Secondary | ICD-10-CM | POA: Insufficient documentation

## 2021-12-09 DIAGNOSIS — I700 Atherosclerosis of aorta: Secondary | ICD-10-CM | POA: Insufficient documentation

## 2021-12-09 DIAGNOSIS — Z01818 Encounter for other preprocedural examination: Secondary | ICD-10-CM | POA: Insufficient documentation

## 2021-12-09 DIAGNOSIS — E039 Hypothyroidism, unspecified: Secondary | ICD-10-CM | POA: Insufficient documentation

## 2021-12-09 DIAGNOSIS — I10 Essential (primary) hypertension: Secondary | ICD-10-CM | POA: Insufficient documentation

## 2021-12-09 DIAGNOSIS — I739 Peripheral vascular disease, unspecified: Secondary | ICD-10-CM | POA: Insufficient documentation

## 2021-12-09 DIAGNOSIS — Z951 Presence of aortocoronary bypass graft: Secondary | ICD-10-CM | POA: Insufficient documentation

## 2021-12-09 DIAGNOSIS — E785 Hyperlipidemia, unspecified: Secondary | ICD-10-CM | POA: Insufficient documentation

## 2021-12-09 LAB — URINALYSIS, ROUTINE W REFLEX MICROSCOPIC
Bilirubin Urine: NEGATIVE
Glucose, UA: NEGATIVE mg/dL
Hgb urine dipstick: NEGATIVE
Ketones, ur: NEGATIVE mg/dL
Leukocytes,Ua: NEGATIVE
Nitrite: NEGATIVE
Protein, ur: 30 mg/dL — AB
Specific Gravity, Urine: 1.016 (ref 1.005–1.030)
pH: 6 (ref 5.0–8.0)

## 2021-12-09 LAB — PULMONARY FUNCTION TEST
DL/VA % pred: 59 %
DL/VA: 2.47 ml/min/mmHg/L
DLCO unc % pred: 44 %
DLCO unc: 8.23 ml/min/mmHg
FEF 25-75 Post: 0.97 L/sec
FEF 25-75 Pre: 1.38 L/sec
FEF2575-%Change-Post: -29 %
FEF2575-%Pred-Post: 71 %
FEF2575-%Pred-Pre: 101 %
FEV1-%Change-Post: -5 %
FEV1-%Pred-Post: 106 %
FEV1-%Pred-Pre: 112 %
FEV1-Post: 1.66 L
FEV1-Pre: 1.75 L
FEV1FVC-%Change-Post: 4 %
FEV1FVC-%Pred-Pre: 99 %
FEV6-%Change-Post: -8 %
FEV6-%Pred-Post: 108 %
FEV6-%Pred-Pre: 118 %
FEV6-Post: 2.09 L
FEV6-Pre: 2.29 L
FEV6FVC-%Change-Post: 0 %
FEV6FVC-%Pred-Post: 104 %
FEV6FVC-%Pred-Pre: 103 %
FVC-%Change-Post: -9 %
FVC-%Pred-Post: 103 %
FVC-%Pred-Pre: 114 %
FVC-Post: 2.09 L
FVC-Pre: 2.31 L
Post FEV1/FVC ratio: 79 %
Post FEV6/FVC ratio: 100 %
Pre FEV1/FVC ratio: 76 %
Pre FEV6/FVC Ratio: 99 %
RV % pred: 85 %
RV: 1.94 L
TLC % pred: 84 %
TLC: 4.16 L

## 2021-12-09 LAB — CBC
HCT: 41.6 % (ref 36.0–46.0)
Hemoglobin: 13.5 g/dL (ref 12.0–15.0)
MCH: 28.7 pg (ref 26.0–34.0)
MCHC: 32.5 g/dL (ref 30.0–36.0)
MCV: 88.5 fL (ref 80.0–100.0)
Platelets: 241 10*3/uL (ref 150–400)
RBC: 4.7 MIL/uL (ref 3.87–5.11)
RDW: 14 % (ref 11.5–15.5)
WBC: 7.4 10*3/uL (ref 4.0–10.5)
nRBC: 0 % (ref 0.0–0.2)

## 2021-12-09 LAB — BLOOD GAS, ARTERIAL
Acid-Base Excess: 1.4 mmol/L (ref 0.0–2.0)
Bicarbonate: 24.9 mmol/L (ref 20.0–28.0)
Drawn by: 58793
O2 Saturation: 96.9 %
Patient temperature: 37
pCO2 arterial: 35 mmHg (ref 32–48)
pH, Arterial: 7.46 — ABNORMAL HIGH (ref 7.35–7.45)
pO2, Arterial: 76 mmHg — ABNORMAL LOW (ref 83–108)

## 2021-12-09 LAB — APTT: aPTT: 26 seconds (ref 24–36)

## 2021-12-09 LAB — SARS CORONAVIRUS 2 (TAT 6-24 HRS): SARS Coronavirus 2: NEGATIVE

## 2021-12-09 LAB — TYPE AND SCREEN
ABO/RH(D): B POS
Antibody Screen: NEGATIVE

## 2021-12-09 LAB — COMPREHENSIVE METABOLIC PANEL
ALT: 17 U/L (ref 0–44)
AST: 19 U/L (ref 15–41)
Albumin: 3.4 g/dL — ABNORMAL LOW (ref 3.5–5.0)
Alkaline Phosphatase: 88 U/L (ref 38–126)
Anion gap: 9 (ref 5–15)
BUN: 10 mg/dL (ref 8–23)
CO2: 23 mmol/L (ref 22–32)
Calcium: 9 mg/dL (ref 8.9–10.3)
Chloride: 106 mmol/L (ref 98–111)
Creatinine, Ser: 0.75 mg/dL (ref 0.44–1.00)
GFR, Estimated: >60 mL/min (ref >60)
Glucose, Bld: 131 mg/dL — ABNORMAL HIGH (ref 70–99)
Potassium: 3.5 mmol/L (ref 3.5–5.1)
Sodium: 138 mmol/L (ref 135–145)
Total Bilirubin: 0.4 mg/dL (ref 0.3–1.2)
Total Protein: 6.9 g/dL (ref 6.5–8.1)

## 2021-12-09 LAB — PROTIME-INR
INR: 0.9 (ref 0.8–1.2)
Prothrombin Time: 12.4 seconds (ref 11.4–15.2)

## 2021-12-09 LAB — SURGICAL PCR SCREEN
MRSA, PCR: NEGATIVE
Staphylococcus aureus: POSITIVE — AB

## 2021-12-09 MED ORDER — ALBUTEROL SULFATE (2.5 MG/3ML) 0.083% IN NEBU
2.5000 mg | INHALATION_SOLUTION | Freq: Once | RESPIRATORY_TRACT | Status: AC
Start: 2021-12-09 — End: 2021-12-09
  Administered 2021-12-09: 2.5 mg via RESPIRATORY_TRACT

## 2021-12-09 NOTE — Progress Notes (Signed)
Notified Levonne Spiller, RN with Dr. Roxan Hockey of patient's abnormal urinalysis.  ?

## 2021-12-09 NOTE — Progress Notes (Signed)
PCP - Dustin Folks, Brooke Bonito, NP ?Cardiologist - Dr. Kirk Ruths ? ?PPM/ICD - n/a  ?Device Orders - n/a ?Rep Notified - n/a ? ?Chest x-ray - 12/09/21 ?EKG - 12/09/21 ?Stress Test - 10/29/20 ?ECHO - 2012 ?Cardiac Cath - 2011 ? ?Sleep Study - denies ?CPAP - denies ? ?Patient states she is pre-diabetic. Denies being on any diabetic medication.  ? ?Blood Thinner Instructions: n/a ?Aspirin Instructions: As of today stop taking Aspirin unless otherwise instructed by your surgeon. Patient states she has not received any instructions from her surgeon and will stop taking Aspirin as of today.  ? ?ERAS Protcol - No. NPO after midnight ?  ?COVID TEST- 12/09/21. Pending.  ? ? ?Anesthesia review: Yes. Messaged AUlice Brilliant, PA during visit due to history of heart murmur. Per [place] -forward chart to anesthesia for review.  ? ?Patient denies shortness of breath, fever, cough and chest pain at PAT appointment ? ? ?All instructions explained to the patient, with a verbal understanding of the material. Patient agrees to go over the instructions while at home for a better understanding. The opportunity to ask questions was provided. ? ? ?

## 2021-12-10 NOTE — Progress Notes (Signed)
Anesthesia Chart Review: ? Case: [id number] Date/Time: 12/11/21 1300  ? Procedure: XI ROBOTIC ASSISTED THORACOSCOPY-RIGHT LOWER LOBE SUPERIOR SEGMENTECTOMY (Right: Chest)  ? Anesthesia type: General  ? Pre-op diagnosis: RLL LUNG NODULE  ? Location: [facility] ROOM 10 / [facility] OR  ? Surgeons: Melrose Nakayama, MD  ? ?  ? ? ?DISCUSSION: Patient is a 76 year old female scheduled for the above procedure. She has a suspicious RLL lung nodule.  ? ?History includes former smoker (quit 08/23/09), HTN, HLD, CAD (s/p CABG: LIMA-LAD, SVG-acute marginal, SVG-D1-OM1 11/13/2010), rheumatic fever, childhood murmur (atrial septal aneurysm, trivial MR/TR 2012 echo), pre-diabetes, GERD, total thyroidectomy/left inferior parathyroidectomy (12/01/00), hypothyroidism, anemia, PAD (atherectomy/angioplasty right SFA 03/21/14; atherectomy/angioplasty left SFA 06/07/16; atherectomy/angioplasty right SFA & angioplasty right CFA/pSFA 02/06/18. ? ?Last cardiology visit with Dr. Stanford Breed on 07/07/2021.  She had a liver stress test in March 2022.  Denied chest pain.  Continued medical therapy recommended. Last PV intervention was in 2019. It appears she was on Plavix for PAD. Dr. Roxan Hockey advised her to hold Plavix for surgery at her 11/24/21 visit.  ? ?Anesthesia team to evaluate on the day of surgery. ? ? ?VS: BP (!) 153/79   Pulse 90   Temp 36.6 ?C   Resp 19   Ht 5\' 3"  (1.6 m)   Wt 62.1 kg   SpO2 96%   BMI 24.27 kg/m?  ? ? ?PROVIDERS: ?[name], FNP ?Kirk Ruths, MD is primary cardiologist.  Last visit 07/07/2021. ?Quay Burow, MD is [facility] cardiologist.  Last visit 05/12/2020. ?Servando Snare, MD is vascular surgeon.  Last visit 09/19/2020. ? ? ?LABS: Labs reviewed: Acceptable for surgery. ?(all labs ordered are listed, but only abnormal results are displayed) ? ?Labs Reviewed  ?SURGICAL PCR SCREEN - Abnormal; Notable for the following components:  ?    Result Value  ? Staphylococcus aureus POSITIVE (*)   ? All other components within  normal limits  ?URINALYSIS, ROUTINE W REFLEX MICROSCOPIC - Abnormal; Notable for the following components:  ? APPearance HAZY (*)   ? Protein, ur 30 (*)   ? Bacteria, UA RARE (*)   ? All other components within normal limits  ?COMPREHENSIVE METABOLIC PANEL - Abnormal; Notable for the following components:  ? Glucose, Bld 131 (*)   ? Albumin 3.4 (*)   ? All other components within normal limits  ?BLOOD GAS, ARTERIAL - Abnormal; Notable for the following components:  ? pH, Arterial 7.46 (*)   ? pO2, Arterial 76 (*)   ? All other components within normal limits  ?SARS CORONAVIRUS 2 (TAT 6-24 HRS)  ?CBC  ?PROTIME-INR  ?APTT  ?TYPE AND SCREEN  ? ? ?PFTs 12/09/21: ?FVC 2.31 (114%), post 2.09 (103%) ?FEV1 1.75 (112%), post 1.66 (105%) ?DLCO unc 8.23 (44%) ? ? ?IMAGES: ?CXR 12/13/21: In process. ? ?PET Scan 11/18/21: ?IMPRESSION: ?- RIGHT lower lobe pulmonary nodule in the posterosuperior chest with ?mild increased metabolic activity and irregular margins, suspicious ?for small bronchogenic neoplasm. ?- Nodular area just inferior to apical scarring separated from more ?confluent areas of apical scarring with some enlargement over time ?is indeterminate and without substantial metabolic activity. Suggest ?attention on follow-up. ?- Scattered smaller pulmonary nodules without increased metabolic ?activity. ?- No signs of nodal disease. ?- Nephrolithiasis. ?- Aortic atherosclerosis. ?  ? ?EKG: 12/09/21: ?Normal sinus rhythm ?Poor anterior R wave progression ?Possible Inferior infarct , age undetermined ?Non-specific ST-t changes ?QT prolonged ?When compared with ECG of 21-Oct-2021 09:17, ?No significant change was found ?Confirmed by  Minus Breeding ([id number]) on 12/09/2021 6:55:47 PM ? ? ?CV: ?Nuclear stress test 10/29/20: ?Nuclear stress EF: 68%. The left ventricular ejection fraction is hyperdynamic (>65%). ?This is a low risk study. There is no evidence of ischemia or previous infarction. ?The study is normal. ? ? ?US Carotid  03/13/18: ?Final Interpretation:  ?- Right Carotid: Velocities in the right ICA are consistent with a 1-39%  ?stenosis.  ?- Left Carotid: Velocities in the left ICA are consistent with a 1-39%  ?stenosis.  ?              Non-hemodynamically significant plaque noted in the CCA. The  ?ECA  ?              appears >50% stenosed.  ?- Vertebrals:  Bilateral vertebral arteries demonstrate antegrade flow.  ?- Subclavians: Normal flow hemodynamics were seen in bilateral subclavian  ?             arteries.  ? ? ?Echo 04/05/11: ?Study Conclusions  ?- Left ventricle: The cavity size was normal. Wall thickness was  ?  normal. Systolic function was normal. The estimated ejection  ?  fraction was in the range of 50% to 55%. Wall motion was normal;  ?  there were no regional wall motion abnormalities. Doppler  ?  parameters are consistent with abnormal left ventricular  ?  relaxation (grade 1 diastolic dysfunction).  ?- Atrial septum: There was an atrial septal aneurysm.  ? ?Last cardiac cath was pre-CABG. ? ?Past Medical History:  ?Diagnosis Date  ? Anemia   ? Anxiety   ? hx  ? Arthritis   ? "my whole body"  ? Atrial septal aneurysm   ? COLONIC POLYPS, HX OF   ? COPD (chronic obstructive pulmonary disease) ([place])   ? no per pt on 03/21/2014 & 02/06/2018  ? Coronary artery disease   ? a. s/p CABG 2012.  ? [place], CERVICAL   ? Gastroesophageal reflux disease   ? Headache   ? "I have headaches all the time" (02/06/2018)  ? Heart murmur   ? as a child  ? History of rheumatic fever   ? Hyperlipidemia   ? Hypertension   ? Hypothyroidism   ? Insomnia   ? Osteoarthritis   ? OSTEOPENIA   ? PAD (peripheral artery disease) ([place])   ? a. PTA to R SFA 2015. b. PTA to L SFA 05/2016.  ? Plantar fasciitis of left foot 10/02/2019  ? Pre-diabetes   ? RSD (reflex sympathetic dystrophy) 10/31/2011  ? Vitamin D deficiency   ? ? ?Past Surgical History:  ?Procedure Laterality Date  ? ABDOMINAL HYSTERECTOMY  1977  ? "partial"  ? BALLOON ANGIOPLASTY,  ARTERY Right 03/21/2014  ? SFA  ? CARDIAC CATHETERIZATION  2011   ? CARPAL TUNNEL RELEASE Right 2005  ? CATARACT EXTRACTION Left 2013  ? CORONARY ARTERY BYPASS GRAFT  10/2009  ? LIMA to the LAD, saphenous vein graft to the acute marginal, saphenous vein graft to the diagonal and obtuse marginal.  ? [place] OF UTERUS  1978  ? JOINT REPLACEMENT    ? LOWER EXTREMITY ANGIOGRAM N/A 03/21/2014  ? Procedure: LOWER EXTREMITY ANGIOGRAM;  Surgeon: Lorretta Harp, MD;  Location: [facility] CATH LAB;  Service: Cardiovascular;  Laterality: N/A;  ? LOWER EXTREMITY ANGIOGRAM  06/07/2016  ? Abdominal aortogram/bilateral iliac angiogram/bifemoral runoff  ? LOWER EXTREMITY ANGIOGRAPHY N/A 02/06/2018  ? Procedure: LOWER EXTREMITY ANGIOGRAPHY;  Surgeon: Lorretta Harp, MD;  Location: [facility]  INVASIVE CV LAB;  Service: Cardiovascular;  Laterality: N/A;  ? PERIPHERAL VASCULAR ATHERECTOMY  02/06/2018  ? Procedure: PERIPHERAL VASCULAR ATHERECTOMY;  Surgeon: Lorretta Harp, MD;  Location: [facility] CV LAB;  Service: Cardiovascular;;  ? PERIPHERAL VASCULAR CATHETERIZATION Bilateral 06/07/2016  ? Procedure: Lower Extremity Angiography;  Surgeon: Lorretta Harp, MD;  Location: [facility] CV LAB;  Service: Cardiovascular;  Laterality: Bilateral;  ? PERIPHERAL VASCULAR CATHETERIZATION Left 06/07/2016  ? Procedure: Peripheral Vascular Atherectomy;  Surgeon: Lorretta Harp, MD;  Location: [facility] CV LAB;  Service: Cardiovascular;  Laterality: Left;  SFA  ? PERIPHERAL VASCULAR CATHETERIZATION Left 06/07/2016  ? Procedure: Peripheral Vascular Balloon Angioplasty;  Surgeon: Lorretta Harp, MD;  Location: [facility] CV LAB;  Service: Cardiovascular;  Laterality: Left;  SFA  ? THYROIDECTOMY  1995  ? TONSILLECTOMY    ? TOTAL KNEE ARTHROPLASTY Right 06/18/2014  ? Procedure: RIGHT TOTAL KNEE ARTHROPLASTY;  Surgeon: Tobi Bastos, MD;  Location: [facility] ORS;  Service: Orthopedics;  Laterality: Right;  ? ? ?MEDICATIONS: ? albuterol  (VENTOLIN HFA) 108 (90 Base) MCG/ACT inhaler  ? amLODipine (NORVASC) 10 MG tablet  ? aspirin EC 81 MG tablet  ? Calcium Carb-Cholecalciferol (CALCIUM + D3) 600-800 MG-UNIT TABS  ? cyclobenzaprine (FLEXERIL) 10 MG tablet  ? ezeti

## 2021-12-10 NOTE — Anesthesia Preprocedure Evaluation (Addendum)
Anesthesia Evaluation  ?Patient identified by MRN, date of birth, ID band ?Patient awake ? ? ? ?Reviewed: ?Allergy & Precautions, NPO status , Patient's Chart, lab work & pertinent test results ? ?Airway ?Mallampati: II ? ?TM Distance: >3 FB ?Neck ROM: Full ? ? ? Dental ? ?(+) Teeth Intact, Dental Advisory Given ?  ?Pulmonary ?COPD, former smoker,  ?RLL nodule ? ?  ?Pulmonary exam normal ?breath sounds clear to auscultation ? ? ? ? ? ? Cardiovascular ?hypertension, Pt. on medications ?+ CAD, + CABG and + Peripheral Vascular Disease  ?Normal cardiovascular exam ?Rhythm:Regular Rate:Normal ? ? ?  ?Neuro/Psych ? Headaches, PSYCHIATRIC DISORDERS Anxiety  Neuromuscular disease   ? GI/Hepatic ?Neg liver ROS, GERD  Medicated,  ?Endo/Other  ?diabetesHypothyroidism  ? Renal/GU ?negative Renal ROS  ? ?  ?Musculoskeletal ?negative musculoskeletal ROS ?(+)  ? Abdominal ?  ?Peds ? Hematology ?negative hematology ROS ?(+)   ?Anesthesia Other Findings ?Day of surgery medications reviewed with the patient. ? Reproductive/Obstetrics ? ?  ? ? ? ? ? ? ? ? ? ? ? ? ? ?  ?  ? ? ? ? ? ? ? ?Anesthesia Physical ?Anesthesia Plan ? ?ASA: 3 ? ?Anesthesia Plan: General  ? ?Post-op Pain Management: Tylenol PO (pre-op)*  ? ?Induction: Intravenous ? ?PONV Risk Score and Plan: 3 and Dexamethasone, Ondansetron and Treatment may vary due to age or medical condition ? ?Airway Management Planned: Double Lumen EBT ? ?Additional Equipment: Arterial line ? ?Intra-op Plan:  ? ?Post-operative Plan: Extubation in OR ? ?Informed Consent: I have reviewed the patients History and Physical, chart, labs and discussed the procedure including the risks, benefits and alternatives for the proposed anesthesia with the patient or authorized representative who has indicated his/her understanding and acceptance.  ? ? ? ?Dental advisory given ? ?Plan Discussed with: CRNA ? ?Anesthesia Plan Comments: (PAT note written 12/10/2021 by Myra Gianotti, PA-C. ? ?2 large bore PIVs ?)  ? ? ? ? ? ?Anesthesia Quick Evaluation ? ?

## 2021-12-11 ENCOUNTER — Other Ambulatory Visit: Payer: Self-pay

## 2021-12-11 ENCOUNTER — Inpatient Hospital Stay (HOSPITAL_COMMUNITY): Payer: Medicare Other | Admitting: Certified Registered Nurse Anesthetist

## 2021-12-11 ENCOUNTER — Encounter (HOSPITAL_COMMUNITY)
Admission: RE | Disposition: A | Discharge status: Home / Self Care | Payer: Self-pay | Source: Home / Self Care | Attending: Thoracic Surgery (Cardiothoracic Vascular Surgery)

## 2021-12-11 ENCOUNTER — Inpatient Hospital Stay (HOSPITAL_COMMUNITY): Payer: Medicare Other

## 2021-12-11 ENCOUNTER — Inpatient Hospital Stay (HOSPITAL_COMMUNITY)
Admission: RE | Admit: 2021-12-11 | Discharge: 2021-12-15 | DRG: 164 | Disposition: A | Discharge status: Home / Self Care | Payer: Medicare Other | Attending: Thoracic Surgery (Cardiothoracic Vascular Surgery) | Admitting: Thoracic Surgery (Cardiothoracic Vascular Surgery)

## 2021-12-11 ENCOUNTER — Inpatient Hospital Stay (HOSPITAL_COMMUNITY): Payer: Medicare Other | Admitting: Vascular Surgery

## 2021-12-11 ENCOUNTER — Encounter (HOSPITAL_COMMUNITY): Payer: Self-pay | Admitting: Thoracic Surgery (Cardiothoracic Vascular Surgery)

## 2021-12-11 DIAGNOSIS — E119 Type 2 diabetes mellitus without complications: Secondary | ICD-10-CM

## 2021-12-11 DIAGNOSIS — Z79890 Hormone replacement therapy (postmenopausal): Secondary | ICD-10-CM

## 2021-12-11 DIAGNOSIS — I10 Essential (primary) hypertension: Secondary | ICD-10-CM | POA: Diagnosis present

## 2021-12-11 DIAGNOSIS — I2510 Atherosclerotic heart disease of native coronary artery without angina pectoris: Secondary | ICD-10-CM | POA: Diagnosis present

## 2021-12-11 DIAGNOSIS — J939 Pneumothorax, unspecified: Secondary | ICD-10-CM | POA: Diagnosis not present

## 2021-12-11 DIAGNOSIS — N200 Calculus of kidney: Secondary | ICD-10-CM | POA: Diagnosis present

## 2021-12-11 DIAGNOSIS — Z7902 Long term (current) use of antithrombotics/antiplatelets: Secondary | ICD-10-CM

## 2021-12-11 DIAGNOSIS — Z87891 Personal history of nicotine dependence: Secondary | ICD-10-CM

## 2021-12-11 DIAGNOSIS — R911 Solitary pulmonary nodule: Secondary | ICD-10-CM

## 2021-12-11 DIAGNOSIS — C3431 Malignant neoplasm of lower lobe, right bronchus or lung: Principal | ICD-10-CM | POA: Diagnosis present

## 2021-12-11 DIAGNOSIS — J449 Chronic obstructive pulmonary disease, unspecified: Secondary | ICD-10-CM | POA: Diagnosis present

## 2021-12-11 DIAGNOSIS — K219 Gastro-esophageal reflux disease without esophagitis: Secondary | ICD-10-CM | POA: Diagnosis present

## 2021-12-11 DIAGNOSIS — M47812 Spondylosis without myelopathy or radiculopathy, cervical region: Secondary | ICD-10-CM | POA: Diagnosis present

## 2021-12-11 DIAGNOSIS — D62 Acute posthemorrhagic anemia: Secondary | ICD-10-CM | POA: Diagnosis not present

## 2021-12-11 DIAGNOSIS — R7303 Prediabetes: Secondary | ICD-10-CM | POA: Diagnosis present

## 2021-12-11 DIAGNOSIS — Z791 Long term (current) use of non-steroidal anti-inflammatories (NSAID): Secondary | ICD-10-CM

## 2021-12-11 DIAGNOSIS — I739 Peripheral vascular disease, unspecified: Secondary | ICD-10-CM | POA: Diagnosis present

## 2021-12-11 DIAGNOSIS — K7689 Other specified diseases of liver: Secondary | ICD-10-CM | POA: Diagnosis present

## 2021-12-11 DIAGNOSIS — Z951 Presence of aortocoronary bypass graft: Secondary | ICD-10-CM

## 2021-12-11 DIAGNOSIS — I253 Aneurysm of heart: Secondary | ICD-10-CM | POA: Diagnosis present

## 2021-12-11 DIAGNOSIS — Z7982 Long term (current) use of aspirin: Secondary | ICD-10-CM

## 2021-12-11 DIAGNOSIS — I700 Atherosclerosis of aorta: Secondary | ICD-10-CM | POA: Diagnosis present

## 2021-12-11 DIAGNOSIS — E039 Hypothyroidism, unspecified: Secondary | ICD-10-CM | POA: Diagnosis present

## 2021-12-11 DIAGNOSIS — E785 Hyperlipidemia, unspecified: Secondary | ICD-10-CM | POA: Diagnosis present

## 2021-12-11 DIAGNOSIS — Z902 Acquired absence of lung [part of]: Principal | ICD-10-CM

## 2021-12-11 DIAGNOSIS — C3411 Malignant neoplasm of upper lobe, right bronchus or lung: Secondary | ICD-10-CM | POA: Diagnosis not present

## 2021-12-11 DIAGNOSIS — Z20822 Contact with and (suspected) exposure to covid-19: Secondary | ICD-10-CM | POA: Diagnosis present

## 2021-12-11 DIAGNOSIS — Z96651 Presence of right artificial knee joint: Secondary | ICD-10-CM | POA: Diagnosis present

## 2021-12-11 DIAGNOSIS — Z79899 Other long term (current) drug therapy: Secondary | ICD-10-CM

## 2021-12-11 HISTORY — PX: LYMPH NODE DISSECTION: SHX5087

## 2021-12-11 HISTORY — PX: XI ROBOTIC ASSISTED THORACOSCOPY- SEGMENTECTOMY: SHX6881

## 2021-12-11 HISTORY — PX: INTERCOSTAL NERVE BLOCK: SHX5021

## 2021-12-11 LAB — GLUCOSE, CAPILLARY
Glucose-Capillary: 202 mg/dL — ABNORMAL HIGH (ref 70–99)
Glucose-Capillary: 317 mg/dL — ABNORMAL HIGH (ref 70–99)

## 2021-12-11 SURGERY — RESECTION, LUNG, SEGMENTAL, ROBOT-ASSISTED
Anesthesia: General | Site: Chest | Laterality: Right

## 2021-12-11 MED ORDER — DEXAMETHASONE SODIUM PHOSPHATE 10 MG/ML IJ SOLN
INTRAMUSCULAR | Status: AC
  Filled 2021-12-11: qty 1

## 2021-12-11 MED ORDER — ARTIFICIAL TEARS OPHTHALMIC OINT
TOPICAL_OINTMENT | OPHTHALMIC | Status: AC
  Filled 2021-12-11: qty 3.5

## 2021-12-11 MED ORDER — ONDANSETRON HCL 4 MG/2ML IJ SOLN
INTRAMUSCULAR | Status: DC | PRN
  Administered 2021-12-11: 4 mg via INTRAVENOUS

## 2021-12-11 MED ORDER — CEFAZOLIN SODIUM-DEXTROSE 2-4 GM/100ML-% IV SOLN
INTRAVENOUS | Status: AC
  Filled 2021-12-11: qty 100

## 2021-12-11 MED ORDER — INDOCYANINE GREEN 25 MG IV SOLR
INTRAVENOUS | Status: DC | PRN
  Administered 2021-12-11: 25 mg via INTRAVENOUS

## 2021-12-11 MED ORDER — MORPHINE SULFATE (PF) 2 MG/ML IV SOLN
2.0000 mg | INTRAVENOUS | Status: DC | PRN
  Administered 2021-12-14 – 2021-12-15 (×2): 2 mg via INTRAVENOUS
  Filled 2021-12-11 (×2): qty 1

## 2021-12-11 MED ORDER — CHLORHEXIDINE GLUCONATE 0.12 % MT SOLN: 15.0000 mL | Freq: Once | OROMUCOSAL | Status: AC

## 2021-12-11 MED ORDER — SENNOSIDES-DOCUSATE SODIUM 8.6-50 MG PO TABS
1.0000 | ORAL_TABLET | Freq: Every day | ORAL | Status: DC
  Administered 2021-12-11 – 2021-12-14 (×4): 1 via ORAL
  Filled 2021-12-11 (×4): qty 1

## 2021-12-11 MED ORDER — 0.9 % SODIUM CHLORIDE (POUR BTL) OPTIME
TOPICAL | Status: DC | PRN
  Administered 2021-12-11: 2000 mL

## 2021-12-11 MED ORDER — MELOXICAM 7.5 MG PO TABS
7.5000 mg | ORAL_TABLET | Freq: Every day | ORAL | Status: DC | PRN
  Filled 2021-12-11: qty 1

## 2021-12-11 MED ORDER — CYCLOBENZAPRINE HCL 10 MG PO TABS
10.0000 mg | ORAL_TABLET | Freq: Every evening | ORAL | Status: DC | PRN
  Administered 2021-12-14: 10 mg via ORAL
  Filled 2021-12-11: qty 1

## 2021-12-11 MED ORDER — BUPIVACAINE HCL (PF) 0.5 % IJ SOLN
INTRAMUSCULAR | Status: AC
  Filled 2021-12-11: qty 30

## 2021-12-11 MED ORDER — CEFAZOLIN SODIUM-DEXTROSE 2-4 GM/100ML-% IV SOLN
2.0000 g | Freq: Three times a day (TID) | INTRAVENOUS | Status: AC
  Administered 2021-12-11 – 2021-12-12 (×2): 2 g via INTRAVENOUS
  Filled 2021-12-11 (×2): qty 100

## 2021-12-11 MED ORDER — METOCLOPRAMIDE HCL 5 MG/ML IJ SOLN
10.0000 mg | Freq: Four times a day (QID) | INTRAMUSCULAR | Status: AC
  Administered 2021-12-11 – 2021-12-12 (×3): 10 mg via INTRAVENOUS
  Filled 2021-12-11 (×4): qty 2

## 2021-12-11 MED ORDER — LACTATED RINGERS IV SOLN: INTRAVENOUS | Status: DC | PRN

## 2021-12-11 MED ORDER — HEMOSTATIC AGENTS (NO CHARGE) OPTIME
TOPICAL | Status: DC | PRN
  Administered 2021-12-11 (×2): 1 via TOPICAL

## 2021-12-11 MED ORDER — FENTANYL CITRATE (PF) 250 MCG/5ML IJ SOLN
INTRAMUSCULAR | Status: DC | PRN
  Administered 2021-12-11: 100 ug via INTRAVENOUS
  Administered 2021-12-11: 50 ug via INTRAVENOUS

## 2021-12-11 MED ORDER — LIDOCAINE 2% (20 MG/ML) 5 ML SYRINGE
INTRAMUSCULAR | Status: DC | PRN
Start: 2021-12-11 — End: 2021-12-11
  Administered 2021-12-11: 80 mg via INTRAVENOUS

## 2021-12-11 MED ORDER — DEXAMETHASONE SODIUM PHOSPHATE 10 MG/ML IJ SOLN
INTRAMUSCULAR | Status: DC | PRN
  Administered 2021-12-11: 8 mg via INTRAVENOUS

## 2021-12-11 MED ORDER — TRAMADOL HCL 50 MG PO TABS: 50.0000 mg | ORAL_TABLET | Freq: Four times a day (QID) | ORAL | Status: DC | PRN

## 2021-12-11 MED ORDER — ENOXAPARIN SODIUM 40 MG/0.4ML IJ SOSY: 40.0000 mg | PREFILLED_SYRINGE | Freq: Every day | INTRAMUSCULAR | Status: DC

## 2021-12-11 MED ORDER — ONDANSETRON HCL 4 MG/2ML IJ SOLN
INTRAMUSCULAR | Status: AC
  Filled 2021-12-11: qty 2

## 2021-12-11 MED ORDER — OXYCODONE HCL 5 MG PO TABS
5.0000 mg | ORAL_TABLET | ORAL | Status: DC | PRN
  Administered 2021-12-11: 10 mg via ORAL
  Filled 2021-12-11 (×2): qty 2

## 2021-12-11 MED ORDER — LACTATED RINGERS IV SOLN: INTRAVENOUS | Status: DC

## 2021-12-11 MED ORDER — BISACODYL 5 MG PO TBEC
10.0000 mg | DELAYED_RELEASE_TABLET | Freq: Every day | ORAL | Status: DC
  Administered 2021-12-11 – 2021-12-15 (×4): 10 mg via ORAL
  Filled 2021-12-11 (×6): qty 2

## 2021-12-11 MED ORDER — ONDANSETRON HCL 4 MG/2ML IJ SOLN: 4.0000 mg | Freq: Once | INTRAMUSCULAR | Status: DC | PRN

## 2021-12-11 MED ORDER — INDOCYANINE GREEN 25 MG IV SOLR
INTRAVENOUS | Status: AC
  Filled 2021-12-11: qty 10

## 2021-12-11 MED ORDER — OYSTER SHELL CALCIUM/D3 500-5 MG-MCG PO TABS
1.0000 | ORAL_TABLET | Freq: Two times a day (BID) | ORAL | Status: DC
  Administered 2021-12-12 – 2021-12-15 (×7): 1 via ORAL
  Filled 2021-12-11 (×7): qty 1

## 2021-12-11 MED ORDER — CEFAZOLIN SODIUM-DEXTROSE 2-4 GM/100ML-% IV SOLN
2.0000 g | INTRAVENOUS | Status: AC
  Administered 2021-12-11: 2 g via INTRAVENOUS

## 2021-12-11 MED ORDER — ROCURONIUM BROMIDE 10 MG/ML (PF) SYRINGE
PREFILLED_SYRINGE | INTRAVENOUS | Status: DC | PRN
  Administered 2021-12-11 (×2): 30 mg via INTRAVENOUS
  Administered 2021-12-11: 20 mg via INTRAVENOUS
  Administered 2021-12-11: 50 mg via INTRAVENOUS
  Administered 2021-12-11: 20 mg via INTRAVENOUS

## 2021-12-11 MED ORDER — ALBUTEROL SULFATE (2.5 MG/3ML) 0.083% IN NEBU: 2.5000 mg | INHALATION_SOLUTION | Freq: Four times a day (QID) | RESPIRATORY_TRACT | Status: DC | PRN

## 2021-12-11 MED ORDER — PHENYLEPHRINE HCL-NACL 20-0.9 MG/250ML-% IV SOLN
INTRAVENOUS | Status: DC | PRN
  Administered 2021-12-11: 20 ug/min via INTRAVENOUS

## 2021-12-11 MED ORDER — ACETAMINOPHEN 500 MG PO TABS
1000.0000 mg | ORAL_TABLET | Freq: Once | ORAL | Status: AC
  Administered 2021-12-11: 1000 mg via ORAL
  Filled 2021-12-11: qty 2

## 2021-12-11 MED ORDER — LEVOTHYROXINE SODIUM 100 MCG PO TABS
100.0000 ug | ORAL_TABLET | Freq: Every day | ORAL | Status: DC
  Administered 2021-12-12 – 2021-12-15 (×4): 100 ug via ORAL
  Filled 2021-12-11 (×4): qty 1

## 2021-12-11 MED ORDER — INSULIN ASPART 100 UNIT/ML IJ SOLN
0.0000 [IU] | INTRAMUSCULAR | Status: DC
  Administered 2021-12-11: 16 [IU] via SUBCUTANEOUS
  Administered 2021-12-12: 4 [IU] via SUBCUTANEOUS
  Administered 2021-12-12 (×2): 2 [IU] via SUBCUTANEOUS
  Administered 2021-12-12: 8 [IU] via SUBCUTANEOUS
  Administered 2021-12-12 (×2): 2 [IU] via SUBCUTANEOUS
  Administered 2021-12-13: 8 [IU] via SUBCUTANEOUS
  Administered 2021-12-13: 2 [IU] via SUBCUTANEOUS
  Administered 2021-12-13: 4 [IU] via SUBCUTANEOUS
  Administered 2021-12-14: 2 [IU] via SUBCUTANEOUS

## 2021-12-11 MED ORDER — FENTANYL CITRATE (PF) 250 MCG/5ML IJ SOLN
INTRAMUSCULAR | Status: AC
  Filled 2021-12-11: qty 5

## 2021-12-11 MED ORDER — PROPOFOL 10 MG/ML IV BOLUS
INTRAVENOUS | Status: DC | PRN
  Administered 2021-12-11: 140 mg via INTRAVENOUS

## 2021-12-11 MED ORDER — FENTANYL CITRATE (PF) 100 MCG/2ML IJ SOLN
25.0000 ug | INTRAMUSCULAR | Status: DC | PRN
  Administered 2021-12-11 (×2): 25 ug via INTRAVENOUS

## 2021-12-11 MED ORDER — ACETAMINOPHEN 500 MG PO TABS
1000.0000 mg | ORAL_TABLET | Freq: Four times a day (QID) | ORAL | Status: DC
Start: 2021-12-11 — End: 2021-12-15
  Administered 2021-12-11 – 2021-12-15 (×14): 1000 mg via ORAL
  Filled 2021-12-11 (×14): qty 2

## 2021-12-11 MED ORDER — PROPOFOL 10 MG/ML IV BOLUS
INTRAVENOUS | Status: AC
  Filled 2021-12-11: qty 20

## 2021-12-11 MED ORDER — SUGAMMADEX SODIUM 200 MG/2ML IV SOLN
INTRAVENOUS | Status: DC | PRN
  Administered 2021-12-11: 200 mg via INTRAVENOUS

## 2021-12-11 MED ORDER — KETOROLAC TROMETHAMINE 15 MG/ML IJ SOLN
INTRAMUSCULAR | Status: AC
  Filled 2021-12-11: qty 1

## 2021-12-11 MED ORDER — SODIUM CHLORIDE 0.45 % IV SOLN: INTRAVENOUS | Status: DC

## 2021-12-11 MED ORDER — HEMOSTATIC AGENTS (NO CHARGE) OPTIME
TOPICAL | Status: DC | PRN
  Administered 2021-12-11: 1 via TOPICAL

## 2021-12-11 MED ORDER — FENTANYL CITRATE (PF) 100 MCG/2ML IJ SOLN
INTRAMUSCULAR | Status: AC
  Filled 2021-12-11: qty 2

## 2021-12-11 MED ORDER — PANTOPRAZOLE SODIUM 40 MG PO TBEC
40.0000 mg | DELAYED_RELEASE_TABLET | Freq: Every day | ORAL | Status: DC
  Administered 2021-12-12 – 2021-12-15 (×4): 40 mg via ORAL
  Filled 2021-12-11 (×4): qty 1

## 2021-12-11 MED ORDER — SODIUM CHLORIDE FLUSH 0.9 % IV SOLN
INTRAVENOUS | Status: DC | PRN
  Administered 2021-12-11: 80 mL

## 2021-12-11 MED ORDER — LIDOCAINE 2% (20 MG/ML) 5 ML SYRINGE
INTRAMUSCULAR | Status: AC
  Filled 2021-12-11: qty 5

## 2021-12-11 MED ORDER — ENOXAPARIN SODIUM 40 MG/0.4ML IJ SOSY
40.0000 mg | PREFILLED_SYRINGE | INTRAMUSCULAR | Status: DC
Start: 2021-12-12 — End: 2021-12-15
  Administered 2021-12-12 – 2021-12-15 (×4): 40 mg via SUBCUTANEOUS
  Filled 2021-12-11 (×4): qty 0.4

## 2021-12-11 MED ORDER — EZETIMIBE 10 MG PO TABS
10.0000 mg | ORAL_TABLET | Freq: Every day | ORAL | Status: DC
  Administered 2021-12-12 – 2021-12-15 (×4): 10 mg via ORAL
  Filled 2021-12-11 (×4): qty 1

## 2021-12-11 MED ORDER — ORAL CARE MOUTH RINSE: 15.0000 mL | Freq: Once | OROMUCOSAL | Status: AC

## 2021-12-11 MED ORDER — POLYVINYL ALCOHOL 1.4 % OP SOLN
1.0000 [drp] | OPHTHALMIC | Status: DC | PRN
  Filled 2021-12-11: qty 15

## 2021-12-11 MED ORDER — BUPIVACAINE LIPOSOME 1.3 % IJ SUSP
INTRAMUSCULAR | Status: AC
  Filled 2021-12-11: qty 20

## 2021-12-11 MED ORDER — SODIUM CHLORIDE 0.9 % IR SOLN
Status: DC | PRN
  Administered 2021-12-11: 1000 mL

## 2021-12-11 MED ORDER — ACETAMINOPHEN 160 MG/5ML PO SOLN: 1000.0000 mg | Freq: Four times a day (QID) | ORAL | Status: DC

## 2021-12-11 MED ORDER — CHLORHEXIDINE GLUCONATE 0.12 % MT SOLN
OROMUCOSAL | Status: AC
  Administered 2021-12-11: 15 mL via OROMUCOSAL
  Filled 2021-12-11: qty 15

## 2021-12-11 MED ORDER — ONDANSETRON HCL 4 MG/2ML IJ SOLN: 4.0000 mg | Freq: Four times a day (QID) | INTRAMUSCULAR | Status: DC | PRN

## 2021-12-11 MED ORDER — ASPIRIN EC 81 MG PO TBEC
81.0000 mg | DELAYED_RELEASE_TABLET | Freq: Every day | ORAL | Status: DC
  Administered 2021-12-12 – 2021-12-15 (×4): 81 mg via ORAL
  Filled 2021-12-11 (×4): qty 1

## 2021-12-11 MED ORDER — KETOROLAC TROMETHAMINE 15 MG/ML IJ SOLN
15.0000 mg | Freq: Four times a day (QID) | INTRAMUSCULAR | Status: AC
  Administered 2021-12-11 – 2021-12-13 (×7): 15 mg via INTRAVENOUS
  Filled 2021-12-11 (×6): qty 1

## 2021-12-11 MED ORDER — AMLODIPINE BESYLATE 10 MG PO TABS
10.0000 mg | ORAL_TABLET | Freq: Every day | ORAL | Status: DC
  Administered 2021-12-12 – 2021-12-15 (×4): 10 mg via ORAL
  Filled 2021-12-11 (×4): qty 1

## 2021-12-11 SURGICAL SUPPLY — 111 items
ADH SKN CLS APL DERMABOND .7 (GAUZE/BANDAGES/DRESSINGS) ×2
ADH SKN CLS LQ APL DERMABOND (GAUZE/BANDAGES/DRESSINGS) ×2
APPLIER CLIP ROT 10 11.4 M/L (STAPLE)
APR CLP MED LRG 11.4X10 (STAPLE)
BAG SPEC RTRVL C125 8X14 (MISCELLANEOUS) ×2
BLADE CLIPPER SURG (BLADE) ×2 IMPLANT
CANISTER SUCT 3000ML PPV (MISCELLANEOUS) ×6 IMPLANT
CANNULA REDUC XI 12-8 STAPL (CANNULA) ×6
CANNULA REDUCER 12-8 DVNC XI (CANNULA) ×4 IMPLANT
CLIP APPLIE ROT 10 11.4 M/L (STAPLE) IMPLANT
CLIP VESOCCLUDE MED 6/CT (CLIP) IMPLANT
CNTNR URN SCR LID CUP LEK RST (MISCELLANEOUS) ×10 IMPLANT
CONN ST 1/4X3/8  BEN (MISCELLANEOUS) ×3
CONN ST 1/4X3/8 BEN (MISCELLANEOUS) IMPLANT
CONT SPEC 4OZ STRL OR WHT (MISCELLANEOUS) ×45
DEFOGGER SCOPE WARMER CLEARIFY (MISCELLANEOUS) ×3 IMPLANT
DERMABOND ADHESIVE PROPEN (GAUZE/BANDAGES/DRESSINGS) ×1
DERMABOND ADVANCED (GAUZE/BANDAGES/DRESSINGS) ×1
DERMABOND ADVANCED .7 DNX12 (GAUZE/BANDAGES/DRESSINGS) ×2 IMPLANT
DERMABOND ADVANCED .7 DNX6 (GAUZE/BANDAGES/DRESSINGS) IMPLANT
DRAIN CHANNEL 28F RND 3/8 FF (WOUND CARE) ×1 IMPLANT
DRAIN CHANNEL 32F RND 10.7 FF (WOUND CARE) IMPLANT
DRAPE ARM DVNC X/XI (DISPOSABLE) ×8 IMPLANT
DRAPE COLUMN DVNC XI (DISPOSABLE) ×2 IMPLANT
DRAPE CV SPLIT W-CLR ANES SCRN (DRAPES) ×3 IMPLANT
DRAPE DA VINCI XI ARM (DISPOSABLE) ×12
DRAPE DA VINCI XI COLUMN (DISPOSABLE) ×3
DRAPE HALF SHEET 40X57 (DRAPES) ×3 IMPLANT
DRAPE INCISE IOBAN 66X45 STRL (DRAPES) IMPLANT
DRAPE ORTHO SPLIT 77X108 STRL (DRAPES) ×3
DRAPE SURG ORHT 6 SPLT 77X108 (DRAPES) ×2 IMPLANT
ELECT BLADE 6.5 EXT (BLADE) IMPLANT
ELECT REM PT RETURN 9FT ADLT (ELECTROSURGICAL) ×3
ELECTRODE REM PT RTRN 9FT ADLT (ELECTROSURGICAL) ×2 IMPLANT
GAUZE 4X4 16PLY ~~LOC~~+RFID DBL (SPONGE) IMPLANT
GAUZE KITTNER 4X5 RF (MISCELLANEOUS) IMPLANT
GAUZE SPONGE 4X4 12PLY STRL (GAUZE/BANDAGES/DRESSINGS) ×3 IMPLANT
GLOVE SURG MICRO LTX SZ7.5 (GLOVE) ×6 IMPLANT
GOWN STRL REUS W/ TWL LRG LVL3 (GOWN DISPOSABLE) ×4 IMPLANT
GOWN STRL REUS W/ TWL XL LVL3 (GOWN DISPOSABLE) ×6 IMPLANT
GOWN STRL REUS W/TWL 2XL LVL3 (GOWN DISPOSABLE) ×6 IMPLANT
GOWN STRL REUS W/TWL LRG LVL3 (GOWN DISPOSABLE) ×6
GOWN STRL REUS W/TWL XL LVL3 (GOWN DISPOSABLE) ×9
HEMOSTAT SURGICEL 2X14 (HEMOSTASIS) ×12 IMPLANT
IRRIGATION STRYKERFLOW (MISCELLANEOUS) ×2 IMPLANT
IRRIGATOR STRYKERFLOW (MISCELLANEOUS) ×3
KIT BASIN OR (CUSTOM PROCEDURE TRAY) ×3 IMPLANT
NDL HYPO 25GX1X1/2 BEV (NEEDLE) ×2 IMPLANT
NEEDLE HYPO 25GX1X1/2 BEV (NEEDLE) ×3 IMPLANT
NS IRRIG 1000ML POUR BTL (IV SOLUTION) ×3 IMPLANT
PACK CHEST (CUSTOM PROCEDURE TRAY) ×3 IMPLANT
PAD ARMBOARD 7.5X6 YLW CONV (MISCELLANEOUS) ×6 IMPLANT
PORT ACCESS TROCAR AIRSEAL 12 (TROCAR) IMPLANT
PORT ACCESS TROCAR AIRSEAL 5M (TROCAR)
PROGEL SPRAY TIP 11IN (MISCELLANEOUS) ×3
RELOAD STAPLE 45 2.5 WHT DVNC (STAPLE) IMPLANT
RELOAD STAPLE 45 3.5 BLU DVNC (STAPLE) IMPLANT
RELOAD STAPLE 45 4.3 GRN DVNC (STAPLE) IMPLANT
RELOAD STAPLER 2.5X45 WHT DVNC (STAPLE) ×4 IMPLANT
RELOAD STAPLER 3.5X45 BLU DVNC (STAPLE) ×18 IMPLANT
RELOAD STAPLER 4.3X45 GRN DVNC (STAPLE) ×8 IMPLANT
SCISSORS LAP 5X35 DISP (ENDOMECHANICALS) IMPLANT
SEAL CANN UNIV 5-8 DVNC XI (MISCELLANEOUS) ×4 IMPLANT
SEAL XI 5MM-8MM UNIVERSAL (MISCELLANEOUS) ×6
SEALANT PROGEL (MISCELLANEOUS) ×1 IMPLANT
SET TRI-LUMEN FLTR TB AIRSEAL (TUBING) ×3 IMPLANT
SHEARS HARMONIC HDI 20CM (ELECTROSURGICAL) IMPLANT
SOLUTION ELECTROLUBE (MISCELLANEOUS) ×3 IMPLANT
SPONGE INTESTINAL PEANUT (DISPOSABLE) IMPLANT
SPONGE T-LAP 18X18 ~~LOC~~+RFID (SPONGE) ×6 IMPLANT
SPONGE TONSIL TAPE 1 RFD (DISPOSABLE) IMPLANT
STAPLER 45 DA VINCI SURE FORM (STAPLE) ×3
STAPLER 45 SUREFORM CVD (STAPLE) ×3
STAPLER 45 SUREFORM CVD DVNC (STAPLE) IMPLANT
STAPLER 45 SUREFORM DVNC (STAPLE) IMPLANT
STAPLER CANNULA SEAL DVNC XI (STAPLE) ×4 IMPLANT
STAPLER CANNULA SEAL XI (STAPLE) ×6
STAPLER RELOAD 2.5X45 WHITE (STAPLE) ×6
STAPLER RELOAD 2.5X45 WHT DVNC (STAPLE) ×4
STAPLER RELOAD 3.5X45 BLU DVNC (STAPLE) ×18
STAPLER RELOAD 3.5X45 BLUE (STAPLE) ×27
STAPLER RELOAD 4.3X45 GREEN (STAPLE) ×12
STAPLER RELOAD 4.3X45 GRN DVNC (STAPLE) ×8
SUT PDS AB 3-0 SH 27 (SUTURE) IMPLANT
SUT PROLENE 4 0 RB 1 (SUTURE)
SUT PROLENE 4-0 RB1 .5 CRCL 36 (SUTURE) IMPLANT
SUT SILK  1 MH (SUTURE) ×3
SUT SILK 1 MH (SUTURE) ×4 IMPLANT
SUT SILK 1 TIES 10X30 (SUTURE) ×3 IMPLANT
SUT SILK 2 0 SH (SUTURE) ×1 IMPLANT
SUT SILK 2 0SH CR/8 30 (SUTURE) IMPLANT
SUT SILK 3 0SH CR/8 30 (SUTURE) IMPLANT
SUT VIC AB 1 CTX 36 (SUTURE) ×3
SUT VIC AB 1 CTX36XBRD ANBCTR (SUTURE) IMPLANT
SUT VIC AB 2-0 CTX 36 (SUTURE) ×1 IMPLANT
SUT VIC AB 3-0 MH 27 (SUTURE) IMPLANT
SUT VIC AB 3-0 X1 27 (SUTURE) ×4 IMPLANT
SUT VICRYL 0 TIES 12 18 (SUTURE) ×3 IMPLANT
SUT VICRYL 0 UR6 27IN ABS (SUTURE) ×6 IMPLANT
SUT VICRYL 2 TP 1 (SUTURE) IMPLANT
SYR 20CC LL (SYRINGE) ×6 IMPLANT
SYSTEM RETRIEVAL ANCHOR 8 (MISCELLANEOUS) ×1 IMPLANT
SYSTEM SAHARA CHEST DRAIN ATS (WOUND CARE) ×3 IMPLANT
TAPE CLOTH 4X10 WHT NS (GAUZE/BANDAGES/DRESSINGS) ×3 IMPLANT
TAPE PAPER 3X10 WHT MICROPORE (GAUZE/BANDAGES/DRESSINGS) ×1 IMPLANT
TIP APPLICATOR SPRAY EXTEND 16 (VASCULAR PRODUCTS) IMPLANT
TIP SPRAY PROGEL 11IN (MISCELLANEOUS) IMPLANT
TOWEL GREEN STERILE (TOWEL DISPOSABLE) ×3 IMPLANT
TRAY FOLEY MTR SLVR 14FR STAT (SET/KITS/TRAYS/PACK) ×1 IMPLANT
TRAY FOLEY MTR SLVR 16FR STAT (SET/KITS/TRAYS/PACK) ×2 IMPLANT
WATER STERILE IRR 1000ML POUR (IV SOLUTION) ×3 IMPLANT

## 2021-12-11 NOTE — Anesthesia Procedure Notes (Addendum)
Arterial Line Insertion ?Start/End4/21/2023 12:00 PM, 12/11/2021 12:05 PM ?Performed by: Lowella Dell, CRNA, CRNA ? Patient location: Pre-op. ?Preanesthetic checklist: patient identified, IV checked, site marked, risks and benefits discussed, surgical consent, monitors and equipment checked, pre-op evaluation, timeout performed and anesthesia consent ?Lidocaine 1% used for infiltration ?Left, radial was placed ?Catheter size: 20 G ? ?Attempts: 1 ?Procedure performed without using ultrasound guided technique. ?Following insertion, dressing applied and Biopatch. ?Post procedure assessment: normal and unchanged ? ?Patient tolerated the procedure well with no immediate complications. ? ? ?

## 2021-12-11 NOTE — Transfer of Care (Signed)
Immediate Anesthesia Transfer of Care Note ? ?Patient: [name] ? ?Procedure(s) Performed: XI ROBOTIC ASSISTED THORACOSCOPY-RIGHT LOWER LOBE SUPERIOR SEGMENT, RIGHT UPPER LOBE WEDGE (Right: Chest) ?INTERCOSTAL NERVE BLOCK (Right: Chest) ?LYMPH NODE DISSECTION (Chest) ? ?Patient Location: PACU ? ?Anesthesia Type:General ? ?Level of Consciousness: drowsy ? ?Airway & Oxygen Therapy: Patient Spontanous Breathing and Patient connected to face mask oxygen ? ?Post-op Assessment: Report given to RN ? ?Post vital signs: Reviewed and stable ? ?Last Vitals:  ?Vitals Value Taken Time  ?BP 133/84 12/11/21 1625  ?Temp    ?Pulse 66 12/11/21 1626  ?Resp 20 12/11/21 1626  ?SpO2 94 % 12/11/21 1626  ?Vitals shown include unvalidated device data. ? ?Last Pain:  ?Vitals:  ? 12/11/21 1141  ?TempSrc:   ?PainSc: 7   ?   ? ?  ? ?Complications: No notable events documented. ?

## 2021-12-11 NOTE — Hospital Course (Addendum)
History of Present Illness: ? ?[name] is a 76 year old woman with a history of tobacco abuse, COPD, lung nodules, CAD, CABG in 2012, hypertension, thoracic aortic atherosclerosis, and reflux.  She has been followed for lung nodules for years.  Her CT showed some progression in a nodule in the posterior right lung.  There was some question as to whether this is in the posterior segment of the right upper lobe inferiorly or in the superior segment of the lower lobe.  The fissure is very indistinct in that area.  Radiology recommended a PET/CT.  That was done and she presented back to Dr. Roxan Hockey for evaluation.  At that time she was feeling well.  She was not having any chest pain, pressure, or tightness.  She quit smoking in 2011.  No cough or wheezing.  No change in appetite or weight loss.  It was felt the patient should undergo Robotic assisted wedge resection vs. Segmentectomy.  The risks and benefits of the procedure were explained to the patient and she was agreeable to proceed. ? ?Hospital Course: ? ?[name] presented to [facility] on 12/11/2021.  She was taken to the operating room and underwent Robotic Assisted Right Video Assisted Thoracoscopy with ***.  She tolerated the procedure without difficulty, was extubated, and taken to the SICU in stable condition. ? ?Postoperative hospital course: ? ?Patient has done well.  On postop day 1 her chest tube had moderate bloody drainage with no air leak and was kept in place.  He was felt to be stable for removal on postoperative day #2.  Vitals have remained stable and she is remained afebrile.  She has maintained good urine output with normal renal function.  She has a mild expected acute blood loss anemia which is stable.  Oxygen is being weaned and she is tolerating aggressive pulmonary hygiene measures.  She is ambulating using standard post surgical rehab modalities.  Incisions are noted to be healing well without evidence of  infection. ?  ?

## 2021-12-11 NOTE — Brief Op Note (Addendum)
12/11/2021 ? ?4:11 PM ? ?PATIENT:  [name]  76 y.o. female ? ?PRE-OPERATIVE DIAGNOSIS:   LUNG NODULES RIGHT UPPER AND LOWER LOBES ? ?POST-OPERATIVE DIAGNOSIS: ADENOCARCINOMAS RIGHT UPPER AND LOWER LOBES ? ?PROCEDURE:  Procedure(s): ? ?XI ROBOTIC ASSISTED THORACOSCOPY- ?Right lower lobe Superior segmentectomy ?Wedge resection of RUL nodule ?Lymph node dissection ?Intercostal nerve blocks levels 3-10 ? ? ?SURGEON:  Surgeon(s) and Role: ?   * Melrose Nakayama, MD - Primary ? ?PHYSICIAN ASSISTANT: Ellwood Handler PA-C, Jadene Pierini PA-C ? ?ASSISTANTS: Lurlean Nanny RNFA  ? ?ANESTHESIA:   general ? ?EBL:  100 mL  ? ?BLOOD ADMINISTERED:none ? ?DRAINS:  28 Chest tube   ? ?LOCAL MEDICATIONS USED:  BUPIVICAINE  ? ?SPECIMEN:  Source of Specimen:  RLL SUPERIOR SEGMENT AND RUL WEDGE, Lymph Node Dissection ? ?DISPOSITION OF SPECIMEN:  PATHOLOGY ? ?COUNTS:  YES ? ?TOURNIQUET:  * No tourniquets in log * ? ?DICTATION: PENDING ? ?PLAN OF CARE: Admit to inpatient  ? ?PATIENT DISPOSITION:  PACU - hemodynamically stable. ?  ?Delay start of Pharmacological VTE agent (>24hrs) due to surgical blood loss or risk of bleeding: no ? ?FROZEN: BOTH NODULES WERE NON-SMALL CELL CARCINOMA, NEG MARGINS ? ?

## 2021-12-11 NOTE — Interval H&P Note (Signed)
History and Physical Interval Note: ? ?12/11/2021 ?11:50 AM ? ?[name]  has presented today for surgery, with the diagnosis of RLL LUNG NODULE.  The various methods of treatment have been discussed with the patient and family. After consideration of risks, benefits and other options for treatment, the patient has consented to  Procedure(s): ?XI ROBOTIC ASSISTED THORACOSCOPY-RIGHT LOWER LOBE SUPERIOR SEGMENTECTOMY (Right) as a surgical intervention.  The patient's history has been reviewed, patient examined, no change in status, stable for surgery.  I have reviewed the patient's chart and labs.  Questions were answered to the patient's satisfaction.   ? ? ?Melrose Nakayama ? ? ?

## 2021-12-11 NOTE — Anesthesia Procedure Notes (Addendum)
Procedure Name: Intubation ?Date/Time: 12/11/2021 12:37 PM ?Performed by: Lowella Dell, CRNA ?Pre-anesthesia Checklist: Patient identified, Emergency Drugs available, Suction available and Patient being monitored ?Patient Re-evaluated:Patient Re-evaluated prior to induction ?Oxygen Delivery Method: Circle System Utilized ?Preoxygenation: Pre-oxygenation with 100% oxygen ?Induction Type: IV induction ?Ventilation: Mask ventilation without difficulty ?Laryngoscope Size: Mac and 3 ?Grade View: Grade I ?Endobronchial tube: Left, EBT position confirmed by auscultation, EBT position confirmed by fiberoptic bronchoscope and Double lumen EBT and 37 Fr ?Number of attempts: 1 ?Airway Equipment and Method: Stylet and Oral airway ?Placement Confirmation: ETT inserted through vocal cords under direct vision, positive ETCO2 and breath sounds checked- equal and bilateral ?Secured at: 28 cm ?Tube secured with: Tape ?Dental Injury: Teeth and Oropharynx as per pre-operative assessment  ?Comments: VivaSight EBT utilized ? ? ? ? ?

## 2021-12-11 NOTE — Plan of Care (Signed)
?  Problem: Education: ?Goal: Knowledge of disease or condition will improve ?Outcome: Progressing ?Goal: Knowledge of the prescribed therapeutic regimen will improve ?Outcome: Progressing ?  ?Problem: Activity: ?Goal: Risk for activity intolerance will decrease ?Outcome: Progressing ?  ?Problem: Cardiac: ?Goal: Will achieve and/or maintain hemodynamic stability ?Outcome: Progressing ?  ?Problem: Clinical Measurements: ?Goal: Postoperative complications will be avoided or minimized ?Outcome: Progressing ?  ?Problem: Respiratory: ?Goal: Respiratory status will improve ?Outcome: Progressing ?  ?Problem: Pain Management: ?Goal: Pain level will decrease ?Outcome: Progressing ?  ?Problem: Skin Integrity: ?Goal: Wound healing without signs and symptoms infection will improve ?Outcome: Progressing ?  ?

## 2021-12-11 NOTE — Discharge Summary (Addendum)
Physician Discharge Summary  ?Patient ID: ?[name] ?MRN: [id number] ?DOB/AGE: 1945-09-25 76 y.o. ? ?Admit date: 12/11/2021 ?Discharge date: 12/15/2021 ? ?Admission Diagnoses: Lung nodules ? ?Patient Active Problem List  ? Diagnosis Date Noted  ? S/P partial lobectomy of lung 12/11/2021  ? Lung nodule 11/02/2021  ? 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 Spasticity 02/09/2021  ? Lumbar radiculopathy, chronic 02/09/2021  ? Abnormality of gait 02/09/2021  ? 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 Plantar fasciitis of left foot 10/02/2019  ? Status post partial hysterectomy 10/24/2018  ? Discharge from the vagina 10/24/2018  ? Claudication in peripheral vascular disease ([place]) 02/06/2018  ? Chronic idiopathic constipation 10/07/2017  ? CAD in native artery 06/08/2016  ? Skin inflammation 04/06/2016  ? Contracture of hand joint 04/06/2016  ? Microhematuria 11/12/2015  ? Left shoulder pain 10/15/2015  ? Trapezius muscle spasm 09/24/2015  ? Type 2 diabetes mellitus ([place]) 08/07/2015  ? Urinary frequency 04/04/2015  ? External hemorrhoid 11/02/2014  ? Left-sided thoracic back pain 10/03/2014  ? Gingivitis 10/03/2014  ? Routine general medical examination at a health care facility 09/10/2014  ? Total knee replacement status 06/18/2014  ? Tooth pain 04/18/2014  ? Claudication ([place]) 03/21/2014  ? Primary localized osteoarthrosis, lower leg 08/10/2013  ? Right knee pain 08/02/2013  ? Upper respiratory infection 08/02/2013  ? Dyspepsia 11/16/2012  ? Knee pain, right 02/18/2012  ? PAD (peripheral artery disease) ([place]) 12/03/2011  ? PVD (peripheral vascular disease) ([place]) 10/31/2011  ? RSD (reflex sympathetic dystrophy) 10/31/2011  ? Vitamin D deficiency   ? Preop exam for internal medicine 10/28/2011  ? Anxiety 03/11/2011  ? COPD (chronic obstructive pulmonary disease) ([place]) 02/02/2011  ? Anemia 02/02/2011  ? History of rheumatic fever 02/02/2011  ? Insomnia 02/02/2011  ? Coronary atherosclerosis  09/08/2010  ? CONSTIPATION 02/19/2010  ? TOBACCO ABUSE 07/21/2009  ? MURMUR 06/17/2009  ? [place] DISEASE, CERVICAL 03/20/2009  ? Pain in Soft Tissues of Limb 05/23/2008  ? Hypothyroidism 09/07/2007  ? Hyperlipidemia LDL goal <70 09/07/2007  ? Essential hypertension 09/07/2007  ? ALLERGIC RHINITIS 09/07/2007  ? GERD 09/07/2007  ? Primary osteoarthritis of right knee 09/07/2007  ? Osteopenia 09/07/2007  ? COLONIC POLYPS, HX OF 09/07/2007  ? ? 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 ?Patient Active Problem List  ? Diagnosis Date Noted  ? Adenocarcinoma right lung 11/02/2021  ? 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 Spasticity 02/09/2021  ? Lumbar radiculopathy, chronic 02/09/2021  ? Abnormality of gait 02/09/2021  ? 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 Plantar fasciitis of left foot 10/02/2019  ? Status post partial hysterectomy 10/24/2018  ? Discharge from the vagina 10/24/2018  ? Claudication in peripheral vascular disease ([place]) 02/06/2018  ? Chronic idiopathic constipation 10/07/2017  ? CAD in native artery 06/08/2016  ? Skin inflammation 04/06/2016  ? Contracture of hand joint 04/06/2016  ? Microhematuria 11/12/2015  ? Left shoulder pain 10/15/2015  ? Trapezius muscle spasm 09/24/2015  ? Type 2 diabetes mellitus ([place]) 08/07/2015  ? Urinary frequency 04/04/2015  ? External hemorrhoid 11/02/2014  ? Left-sided thoracic back pain 10/03/2014  ? Gingivitis 10/03/2014  ? Routine general medical examination at a health care facility 09/10/2014  ? Total knee replacement status 06/18/2014  ? Tooth pain 04/18/2014  ? Claudication ([place]) 03/21/2014  ? Primary localized osteoarthrosis, lower leg 08/10/2013  ? Right knee pain 08/02/2013  ? Upper respiratory infection  08/02/2013  ? Dyspepsia 11/16/2012  ? Knee pain, right 02/18/2012  ? PAD (peripheral artery disease) ([place]) 12/03/2011  ? PVD (peripheral vascular disease) ([place]) 10/31/2011  ? RSD  (reflex sympathetic dystrophy) 10/31/2011  ? Vitamin D deficiency   ? Preop exam for internal medicine 10/28/2011  ? Anxiety 03/11/2011  ? COPD (chronic obstructive pulmonary disease) ([place]) 02/02/2011  ? Anemia 02/02/2011  ? History of rheumatic fever 02/02/2011  ? Insomnia 02/02/2011  ? Coronary atherosclerosis 09/08/2010  ? CONSTIPATION 02/19/2010  ? TOBACCO ABUSE 07/21/2009  ? MURMUR 06/17/2009  ? [place] DISEASE, CERVICAL 03/20/2009  ? Pain in Soft Tissues of Limb 05/23/2008  ? Hypothyroidism 09/07/2007  ? Hyperlipidemia LDL goal <70 09/07/2007  ? Essential hypertension 09/07/2007  ? ALLERGIC RHINITIS 09/07/2007  ? GERD 09/07/2007  ? Primary osteoarthritis of right knee 09/07/2007  ? Osteopenia 09/07/2007  ? COLONIC POLYPS, HX OF 09/07/2007  ? ? ?Discharged Condition: stable ? ?History of Present Illness: ? ?[name] is a 77 year old woman with a history of tobacco abuse, COPD, lung nodules, CAD, CABG in 2012, hypertension, thoracic aortic atherosclerosis, and reflux.  She has been followed for lung nodules for years.  Her CT showed some progression in a nodule in the posterior right lung.  There was some question as to whether this is in the posterior segment of the right upper lobe inferiorly or in the superior segment of the lower lobe.  The fissure is very indistinct in that area.  Radiology recommended a PET/CT.  That was done and she presented back to Dr. Roxan Hockey for evaluation.  At that time she was feeling well.  She was not having any chest pain, pressure, or tightness.  She quit smoking in 2011.  No cough or wheezing.  No change in appetite or weight loss.  It was felt the patient should undergo Robotic assisted wedge resection vs. Segmentectomy.  The risks and benefits of the procedure were explained to the patient and she was agreeable to proceed. ? ?Hospital Course: ? ?Dyanne Yorks presented to [facility] on 12/11/2021.  She was taken to the operating room and underwent Robotic  Assisted Right Video Assisted Thoracoscopy with RIGHT LOWER LOBE SUPERIOR SEGMENTECTOMY and RIGHT UPPER LOBE WEDGE RESECTION with INTERCOSTAL NERVE BLOCK and LYMPH NODE DISSECTION.  She tolerated the procedures without difficulty, was extubated, and taken to the SICU in stable condition. ? ?Postoperative hospital course: ? ?On postop day 1 her chest tube had moderate bloody drainage with no air leak and was kept in place.  On post-op day 2, there was no air leak and minimal drainage so the chest tube was removed. Follow up CXR showed a small stable right apical air space.  Vitals have remained stable and she is remained afebrile.  She has maintained good urine output with normal renal function.  She has a mild expected acute blood loss anemia which is stable.  Supplemental oxygen was weaned and she is tolerating aggressive pulmonary hygiene measures.  She is ambulating using standard post surgical rehab modalities.  Incisions are noted to be healing well without evidence of infection. She is maintaining adequate O2 saturations on room air.  ? ?The pathology report shows two separate  stage 1A adenocarcinoma lesions in the two specimens submitted. This was discussed with the patient by Dr. Roxan Hockey.  ? ?Consults: None ? ?Significant Diagnostic Studies: nuclear medicine:  ? ?FINDINGS: ?Mediastinal blood pool activity: SUV max 2.27 ?  ?Liver activity: SUV max NA ?  ?NECK: No  hypermetabolic lymph nodes in the neck. ?  ?Incidental CT findings: none ?  ?CHEST: Signs of pulmonary emphysema and biapical scarring. ?  ?Nodule of concern in the posterior superior segment RIGHT lower lobe ?with spiculated margins displays a maximum SUV of 2.24. (Image 66/4) ?  ?Mildly spiculated area inferior to signs of apical scarring in the ?LEFT upper lobe at 8 mm on the recent chest CT is unchanged. Maximum ?SUV in this area of 1.55. ?  ?Other smaller pulmonary nodules are stable and without increased ?metabolic activity. ?  ?Incidental  CT findings: Calcified atheromatous plaque of the ?thoracic aorta. Post median sternotomy for CABG. Extensive coronary ?artery calcification of native coronary vessels. No adenopathy by ?size criteria in the

## 2021-12-12 ENCOUNTER — Inpatient Hospital Stay (HOSPITAL_COMMUNITY): Payer: Medicare Other

## 2021-12-12 LAB — GLUCOSE, CAPILLARY
Glucose-Capillary: 125 mg/dL — ABNORMAL HIGH (ref 70–99)
Glucose-Capillary: 133 mg/dL — ABNORMAL HIGH (ref 70–99)
Glucose-Capillary: 143 mg/dL — ABNORMAL HIGH (ref 70–99)
Glucose-Capillary: 165 mg/dL — ABNORMAL HIGH (ref 70–99)
Glucose-Capillary: 184 mg/dL — ABNORMAL HIGH (ref 70–99)
Glucose-Capillary: 189 mg/dL — ABNORMAL HIGH (ref 70–99)
Glucose-Capillary: 223 mg/dL — ABNORMAL HIGH (ref 70–99)
Glucose-Capillary: 235 mg/dL — ABNORMAL HIGH (ref 70–99)

## 2021-12-12 LAB — BASIC METABOLIC PANEL
Anion gap: 13 (ref 5–15)
BUN: 13 mg/dL (ref 8–23)
CO2: 21 mmol/L — ABNORMAL LOW (ref 22–32)
Calcium: 8.7 mg/dL — ABNORMAL LOW (ref 8.9–10.3)
Chloride: 104 mmol/L (ref 98–111)
Creatinine, Ser: 0.8 mg/dL (ref 0.44–1.00)
GFR, Estimated: >60 mL/min (ref >60)
Glucose, Bld: 192 mg/dL — ABNORMAL HIGH (ref 70–99)
Potassium: 3.9 mmol/L (ref 3.5–5.1)
Sodium: 138 mmol/L (ref 135–145)

## 2021-12-12 NOTE — Op Note (Signed)
NAME: [name], [name] ?MEDICAL RECORD NO: [id number] ?ACCOUNT NO: [id number] ?DATE OF BIRTH: Aug 11, 1946 ?FACILITY: [facility] ?LOCATION: [facility] ?PHYSICIAN: Revonda Standard. Roxan Hockey, MD ? ?Operative Report  ? ?DATE OF PROCEDURE: 12/11/2021 ? ?PREOPERATIVE DIAGNOSIS:  Right upper and lower lobe lung nodules. ? 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 ? ?PROCEDURES:   ?Xi robotic-assisted right thoracoscopy,  ?Right upper lobe wedge resection,  ?Right lower lobe superior segmentectomy,  ?Lymph node dissection and  ?Intercostal nerve blocks levels 3 through 10. ? ?SURGEON:  Revonda Standard. Roxan Hockey, MD ? ?ASSISTANT:  Jadene Pierini, PA and Ellwood Handler, [place]. ? ?ANESTHESIA:  General. ? ?FINDINGS:  Both nodules were non-small cell carcinoma, probable adenocarcinoma. Margin negative for tumor. ? ?CLINICAL NOTE:  Mrs. [name] is a 76 year old woman with a history of tobacco abuse, COPD and lung nodules.  She has been followed for lung nodules for years.  Recently, her CT showed progression of the nodule in the posterior right upper lobe and there also was ? a nodule along the fissure in the superior segment of the lower lobe.  On PET/CT, the larger nodule was mildly hypermetabolic.  She was advised to undergo surgical resection for definitive diagnosis and treatment.  The indications, risks, benefits, and  ?alternatives were discussed in detail with the patient.  She understood and accepted the risks and agreed to proceed. ? ?OPERATIVE NOTE:  Mrs. [name] was brought to the preoperative holding area on 12/11/2021.  Anesthesia established intravenous access and placed an arterial blood pressure monitoring line.  She was taken to the operating room and anesthetized and intubated ? with a double lumen endotracheal tube.  Intravenous antibiotics were administered.  A Foley catheter was placed.  Sequential compression devices were placed on the calves for DVT prophylaxis.  She was placed in a left  lateral decubitus position. A Bair Hugger was placed for active warming.  The right chest was prepped and draped in the usual sterile fashion.  Single lung ventilation of the left lung was initiated and was tolerated  ?well throughout the procedure. ? ?A timeout was performed.  A solution containing 20 mL of liposomal bupivacaine, 30 mL of 0.5% bupivacaine and 50 mL of saline was prepared.  This solution was used for local at the incision sites as well as for the intercostal nerve blocks.  An incision was made  ?in the eighth interspace in approximately the mid axillary line and an 8 mm port was inserted.  The thoracoscope was advanced into the chest.  After confirming intrapleural placement, carbon dioxide was insufflated per protocol.  A 12 mm port was placed in  ?the eighth interspace anterior to the camera port and then a 12 mm AirSeal port was placed in the tenth interspace anteriorly.  Intercostal nerve blocks then were performed from the third to the tenth interspace by injecting 10 mL of the bupivacaine  ?solution into a subpleural plane at each level.  Two additional 8th interspace ports were then placed for the robot.  The robot was deployed.  The camera arm was docked.  The thoracoscope was inserted.  Targeting was performed.  The remaining arms were  ?docked.  Robotic instruments were inserted with thoracoscopic visualization. ? ?The lower lobe was retracted superiorly.  The inferior ligament was divided with bipolar cautery.  Level 9 lymph node was identified and removed.  The pleural reflection was divided at the hilum posteriorly and level 8 and 7 nodes were removed.  Working at  ?the  bifurcation of the upper lobe bronchus from the bronchus intermedius, a level 11 node was removed.  Subsequent level 11 nodes were removed later in the procedure as well.  There were adhesions at the apex, which were taken down with bipolar cautery.   ?The pleura was incised over the mediastinum superior to the azygos  vein and a level 4R node was removed.  Working anteriorly, the pleural reflection was divided at the hilum anteriorly as well.  Attention then was turned to the fissure and it was  ?incomplete.  There was good deflation of the right lung.  Identification of the nodule was difficult due to the emphysematous nature of the lung, but ultimately the right upper lobe nodule was visible.  The superior segmental nodule was felt to be  ?visible, but to ensure that this was included in the specimen plan was to perform a superior segmentectomy.  The fissure was completed with sequential firings of the robotic stapler, working from an anterior to posterior direction.  The wedge resection  ?then was performed of the right upper lobe nodule maintaining a 2 cm gross margin on the tumor.  The superior segmental arterial branches had been dissected out while removing the lymph nodes.  These were then divided with the vascular stapler.  The  ?superior segmental bronchus was identified.  A stapler was placed across the bronchus.  A test inflation showed good aeration of the remainder of the lower lobe.  Stapler was fired transecting the superior segmental bronchus.  ICG then was given  ?intravenously and the demarcation of the superior segment was clear.  The segmentectomy was completed with sequential firings of the robotic stapler.  The robotic instruments were removed.  The robot was undocked.  An 8 mm endoscopic retrieval bag was  ?placed through the AirSeal port and both specimens were placed into the bag, which was removed.  The nodules were marked and sent for frozen section.  Frozen section returned showing non-small cell carcinoma in both cases and margins were clear.  The chest ?was copiously irrigated with warm saline.  Hemostasis was achieved.  A 28-French Blake drain was placed through the most anterior incision and secured with #1 silk suture.  Progel was applied to the staple lines and the fissure.  Dual lung  ventilation  ?was resumed.  The remaining incisions were closed in standard fashion.  Dermabond was applied.  Chest tube was placed to a Pleur-Evac on waterseal. The patient was placed in the supine position.  She was extubated in the operating room and taken to the  ?postanesthetic care unit in good condition.  All sponge, needle and instrument counts were correct at the end of the procedure.  Experienced assistance was necessary for this case due to surgical complexity.  Jadene Pierini and Erin Barrett filled that role  ?providing assistance with port placement, suctioning, specimen retrieval, instrument exchange and wound closure. ? ? ?[facility] ?D: 12/11/2021 5:19:12 pm T: 12/12/2021 12:28:00 am  ?JOB: [id number]/ [id number]  ?

## 2021-12-12 NOTE — Plan of Care (Signed)
?  Problem: Education: ?Goal: Knowledge of the prescribed therapeutic regimen will improve ?Outcome: Progressing ?  ?Problem: Education: ?Goal: Knowledge of disease or condition will improve ?Outcome: Progressing ?  ?

## 2021-12-12 NOTE — Plan of Care (Signed)
?  Problem: Education: ?Goal: Knowledge of disease or condition will improve ?Outcome: Progressing ?Goal: Knowledge of the prescribed therapeutic regimen will improve ?Outcome: Progressing ?  ?Problem: Activity: ?Goal: Risk for activity intolerance will decrease ?Outcome: Progressing ?  ?Problem: Cardiac: ?Goal: Will achieve and/or maintain hemodynamic stability ?Outcome: Progressing ?  ?Problem: Clinical Measurements: ?Goal: Postoperative complications will be avoided or minimized ?Outcome: Progressing ?  ?Problem: Respiratory: ?Goal: Respiratory status will improve ?Outcome: Progressing ?  ?Problem: Pain Management: ?Goal: Pain level will decrease ?Outcome: Progressing ?  ?Problem: Skin Integrity: ?Goal: Wound healing without signs and symptoms infection will improve ?Outcome: Progressing ?  ?

## 2021-12-12 NOTE — Progress Notes (Addendum)
? ?   ?[address] ?      [place] [address] ?            [phone]   ? ?  1 Day Post-Op Procedure(s) (LRB): ?XI ROBOTIC ASSISTED THORACOSCOPY-RIGHT LOWER LOBE SUPERIOR SEGMENT, RIGHT UPPER LOBE WEDGE (Right) ?INTERCOSTAL NERVE BLOCK (Right) ?LYMPH NODE DISSECTION ?Subjective: ?Feels pretty well, somewhat uncomfortable ? ?Objective: ?Vital signs in last 24 hours: ?Temp:  [97 ?F (36.1 ?C)-98.7 ?F (37.1 ?C)] 98.7 ?F (37.1 ?C) (04/22 6578) ?Pulse Rate:  [64-87] 72 (04/22 0709) ?Cardiac Rhythm: Normal sinus rhythm (04/22 0740) ?Resp:  [13-25] 15 (04/22 0709) ?BP: (116-143)/(58-84) 123/66 (04/22 0709) ?SpO2:  [91 %-95 %] 95 % (04/22 0709) ?Arterial Line BP: (143-155)/(66) 143/66 (04/21 1640) ?Weight:  [61.2 kg] 61.2 kg (04/21 1118) ? ?Hemodynamic parameters for last 24 hours: ?  ? ?Intake/Output from previous day: ?04/21 0701 - 04/22 0700 ?In: 1800 [I.V.:1500; IV Piggyback:300] ?Out: 1269 [Urine:1060; Blood:100; Chest Tube:109] ?Intake/Output this shift: ?Total I/O ?In: 0  ?Out: 20 [Chest Tube:20] ? ?General appearance: alert, cooperative, and no distress ?Heart: regular rate and rhythm ?Lungs: clear to auscultation bilaterally ?Abdomen: benign ?Extremities: PAS in place ?Wound: incis healing well ? ?Lab Results: ?Recent Labs  ?  12/09/21 ?4696  ?WBC 7.4  ?HGB 13.5  ?HCT 41.6  ?PLT 241  ? ?BMET:  ?Recent Labs  ?  12/09/21 ?1132 12/12/21 ?0038  ?NA 138 138  ?K 3.5 3.9  ?CL 106 104  ?CO2 23 21*  ?GLUCOSE 131* 192*  ?BUN 10 13  ?CREATININE 0.75 0.80  ?CALCIUM 9.0 8.7*  ?  ?PT/INR:  ?Recent Labs  ?  12/09/21 ?1132  ?LABPROT 12.4  ?INR 0.9  ? ?ABG ?   ?Component Value Date/Time  ? PHART 7.46 (H) 12/09/2021 1150  ? HCO3 24.9 12/09/2021 1150  ? TCO2 24 01/29/2015 1357  ? ACIDBASEDEF 1.0 11/13/2010 2157  ? O2SAT 96.9 12/09/2021 1150  ? ?CBG (last 3)  ?Recent Labs  ?  12/11/21 ?2355 12/12/21 ?0329 12/12/21 ?2952  ?GLUCAP 235* 133* 125*  ? ? ?Meds ?Scheduled Meds: ? acetaminophen  1,000 mg Oral Q6H  ? Or  ?  acetaminophen (TYLENOL) oral liquid 160 mg/5 mL  1,000 mg Oral Q6H  ? amLODipine  10 mg Oral Daily  ? aspirin EC  81 mg Oral Daily  ? bisacodyl  10 mg Oral Daily  ? calcium-vitamin D  1 tablet Oral BID  ? enoxaparin (LOVENOX) injection  40 mg Subcutaneous Q24H  ? ezetimibe  10 mg Oral Daily  ? insulin aspart  0-24 Units Subcutaneous Q4H  ? ketorolac  15 mg Intravenous Q6H  ? levothyroxine  100 mcg Oral QAC breakfast  ? metoCLOPramide (REGLAN) injection  10 mg Intravenous Q6H  ? pantoprazole  40 mg Oral QAC breakfast  ? senna-docusate  1 tablet Oral QHS  ? ?Continuous Infusions: ? sodium chloride 75 mL/hr at 12/12/21 1018  ? ?PRN Meds:.albuterol, cyclobenzaprine, meloxicam, morphine injection, ondansetron (ZOFRAN) IV, oxyCODONE, polyvinyl alcohol, traMADol ? ?Xrays ?DG Chest Port 1 View ? ?Result Date: 12/12/2021 ?CLINICAL DATA:  Postop from right lung surgery. Follow-up pneumothorax. EXAM: PORTABLE CHEST 1 VIEW COMPARISON:  12/11/2021 FINDINGS: Right chest tube remains in place. A small approximately 10% right apical pneumothorax remains stable. Surgical staples again seen in the right perihilar region. Increased subsegmental atelectasis noted in the left lung base. Mild cardiomegaly stable. Prior CABG again noted. IMPRESSION: Stable small approximately 10% right apical pneumothorax. Right chest tube remains  in place. Increased left basilar subsegmental atelectasis. Electronically Signed   By: Marlaine Hind M.D.   On: 12/12/2021 09:04  ? ?DG Chest Port 1 View ? ?Result Date: 12/11/2021 ?CLINICAL DATA:  Status post right thoracoscopy check chest tube placement EXAM: PORTABLE CHEST 1 VIEW COMPARISON:  12/09/2021 FINDINGS: Right chest tube terminates in the right apex. Status post median sternotomy and CABG. Unchanged cardiac and mediastinal contours. Aortic atherosclerosis. Surgical changes in the right lung. Scattered opacities, likely atelectasis. No pleural effusion. Small right apical pneumothorax. No acute osseous  abnormality. IMPRESSION: 1. Right chest tube tip in right apex. 2. Postsurgical changes with small right apical pneumothorax. Electronically Signed   By: Merilyn Baba M.D.   On: 12/11/2021 16:57   ? ?Assessment/Plan: ?S/P Procedure(s) (LRB): ?XI ROBOTIC ASSISTED THORACOSCOPY-RIGHT LOWER LOBE SUPERIOR SEGMENT, RIGHT UPPER LOBE WEDGE (Right) ?INTERCOSTAL NERVE BLOCK (Right) ?LYMPH NODE DISSECTION ?POD#1 ? ?1 afeb, VSS ?2 sats good on 2 liters ?3 CT- 160 cc- bloody, no air leak- keep to H2O seal ?4 good UOP, normal renal fxn ?5 CBC pending ?6 CXR small right apical pntx, some left basilar subsegmental atx ?7 routine pulm hygiene/rehab, pain control is good ?8 d/c IVF. foley ?9 lovenox for DVT ppx ? ? ? LOS: 1 day  ? ? ?John Giovanni PA-C ?Pager [phone] ?12/12/2021 ?  ?  Chart reviewed, patient examined, agree with above. ?She feels well. Chest tube output bloody but not much drainage. No air leak. CXR ok. Tube can probably come out tomorrow if no changes. ?

## 2021-12-13 ENCOUNTER — Inpatient Hospital Stay (HOSPITAL_COMMUNITY): Payer: Medicare Other

## 2021-12-13 LAB — COMPREHENSIVE METABOLIC PANEL
ALT: 15 U/L (ref 0–44)
AST: 22 U/L (ref 15–41)
Albumin: 2.8 g/dL — ABNORMAL LOW (ref 3.5–5.0)
Alkaline Phosphatase: 66 U/L (ref 38–126)
Anion gap: 7 (ref 5–15)
BUN: 11 mg/dL (ref 8–23)
CO2: 27 mmol/L (ref 22–32)
Calcium: 8.9 mg/dL (ref 8.9–10.3)
Chloride: 108 mmol/L (ref 98–111)
Creatinine, Ser: 0.6 mg/dL (ref 0.44–1.00)
GFR, Estimated: >60 mL/min (ref >60)
Glucose, Bld: 128 mg/dL — ABNORMAL HIGH (ref 70–99)
Potassium: 3.9 mmol/L (ref 3.5–5.1)
Sodium: 142 mmol/L (ref 135–145)
Total Bilirubin: 0.6 mg/dL (ref 0.3–1.2)
Total Protein: 5.8 g/dL — ABNORMAL LOW (ref 6.5–8.1)

## 2021-12-13 LAB — CBC
HCT: 36.5 % (ref 36.0–46.0)
Hemoglobin: 11.9 g/dL — ABNORMAL LOW (ref 12.0–15.0)
MCH: 29 pg (ref 26.0–34.0)
MCHC: 32.6 g/dL (ref 30.0–36.0)
MCV: 88.8 fL (ref 80.0–100.0)
Platelets: 218 10*3/uL (ref 150–400)
RBC: 4.11 MIL/uL (ref 3.87–5.11)
RDW: 14.4 % (ref 11.5–15.5)
WBC: 10.3 10*3/uL (ref 4.0–10.5)
nRBC: 0 % (ref 0.0–0.2)

## 2021-12-13 LAB — GLUCOSE, CAPILLARY
Glucose-Capillary: 129 mg/dL — ABNORMAL HIGH (ref 70–99)
Glucose-Capillary: 228 mg/dL — ABNORMAL HIGH (ref 70–99)
Glucose-Capillary: 73 mg/dL (ref 70–99)
Glucose-Capillary: 156 mg/dL — ABNORMAL HIGH (ref 70–99)
Glucose-Capillary: 169 mg/dL — ABNORMAL HIGH (ref 70–99)
Glucose-Capillary: 118 mg/dL — ABNORMAL HIGH (ref 70–99)

## 2021-12-13 NOTE — Progress Notes (Addendum)
Patient chest tube removed without difficulty. Sutures tied and tagaderm applied to the site. Patient tolerated well. Very pleased to get chest tube out. ?

## 2021-12-13 NOTE — Progress Notes (Addendum)
? ?   ?[address] ?      [place] [address] ?            [phone]   ? ?  2 Days Post-Op Procedure(s) (LRB): ?XI ROBOTIC ASSISTED THORACOSCOPY-RIGHT LOWER LOBE SUPERIOR SEGMENT, RIGHT UPPER LOBE WEDGE (Right) ?INTERCOSTAL NERVE BLOCK (Right) ?LYMPH NODE DISSECTION ?Subjective: ?Feels pretty well, + sputum production- clear ? ?Objective: ?Vital signs in last 24 hours: ?Temp:  [97.7 ?F (36.5 ?C)-98.7 ?F (37.1 ?C)] 97.8 ?F (36.6 ?C) (04/23 7371) ?Pulse Rate:  [69-93] 78 (04/23 0717) ?Cardiac Rhythm: Normal sinus rhythm;Heart block (04/23 0743) ?Resp:  [19-20] 20 (04/23 0717) ?BP: (124-132)/(55-75) 129/62 (04/23 0717) ?SpO2:  [91 %-96 %] 93 % (04/23 0717) ? ?Hemodynamic parameters for last 24 hours: ?  ? ?Intake/Output from previous day: ?04/22 0701 - 04/23 0700 ?In: 0  ?Out: 1910 [Urine:1550; Chest Tube:360] ?Intake/Output this shift: ?Total I/O ?In: -  ?Out: 200 [Urine:200] ? ?General appearance: alert, cooperative, and no distress ?Heart: regular rate and rhythm ?Lungs: clear ?Abdomen: benign ?Extremities: no edema or calf tenderness ?Wound: incis healing well ? ?Lab Results: ?Recent Labs  ?  12/13/21 ?0235  ?WBC 10.3  ?HGB 11.9*  ?HCT 36.5  ?PLT 218  ? ?BMET:  ?Recent Labs  ?  12/12/21 ?0038 12/13/21 ?0235  ?NA 138 142  ?K 3.9 3.9  ?CL 104 108  ?CO2 21* 27  ?GLUCOSE 192* 128*  ?BUN 13 11  ?CREATININE 0.80 0.60  ?CALCIUM 8.7* 8.9  ?  ?PT/INR: No results for input(s): LABPROT, INR in the last 72 hours. ?ABG ?   ?Component Value Date/Time  ? PHART 7.46 (H) 12/09/2021 1150  ? HCO3 24.9 12/09/2021 1150  ? TCO2 24 01/29/2015 1357  ? ACIDBASEDEF 1.0 11/13/2010 2157  ? O2SAT 96.9 12/09/2021 1150  ? ?CBG (last 3)  ?Recent Labs  ?  12/12/21 ?2334 12/13/21 ?0626 12/13/21 ?0745  ?GLUCAP 165* 129* 228*  ? ? ?Meds ?Scheduled Meds: ? acetaminophen  1,000 mg Oral Q6H  ? Or  ? acetaminophen (TYLENOL) oral liquid 160 mg/5 mL  1,000 mg Oral Q6H  ? amLODipine  10 mg Oral Daily  ? aspirin EC  81 mg Oral Daily  ?  bisacodyl  10 mg Oral Daily  ? calcium-vitamin D  1 tablet Oral BID  ? enoxaparin (LOVENOX) injection  40 mg Subcutaneous Q24H  ? ezetimibe  10 mg Oral Daily  ? insulin aspart  0-24 Units Subcutaneous Q4H  ? ketorolac  15 mg Intravenous Q6H  ? levothyroxine  100 mcg Oral QAC breakfast  ? pantoprazole  40 mg Oral QAC breakfast  ? senna-docusate  1 tablet Oral QHS  ? ?Continuous Infusions: ?PRN Meds:.albuterol, cyclobenzaprine, meloxicam, morphine injection, ondansetron (ZOFRAN) IV, oxyCODONE, polyvinyl alcohol, traMADol ? ?Xrays ?DG Chest Port 1 View ? ?Result Date: 12/12/2021 ?CLINICAL DATA:  Postop from right lung surgery. Follow-up pneumothorax. EXAM: PORTABLE CHEST 1 VIEW COMPARISON:  12/11/2021 FINDINGS: Right chest tube remains in place. A small approximately 10% right apical pneumothorax remains stable. Surgical staples again seen in the right perihilar region. Increased subsegmental atelectasis noted in the left lung base. Mild cardiomegaly stable. Prior CABG again noted. IMPRESSION: Stable small approximately 10% right apical pneumothorax. Right chest tube remains in place. Increased left basilar subsegmental atelectasis. Electronically Signed   By: Marlaine Hind M.D.   On: 12/12/2021 09:04  ? ?DG Chest Port 1 View ? ?Result Date: 12/11/2021 ?CLINICAL DATA:  Status post right thoracoscopy check chest tube  placement EXAM: PORTABLE CHEST 1 VIEW COMPARISON:  12/09/2021 FINDINGS: Right chest tube terminates in the right apex. Status post median sternotomy and CABG. Unchanged cardiac and mediastinal contours. Aortic atherosclerosis. Surgical changes in the right lung. Scattered opacities, likely atelectasis. No pleural effusion. Small right apical pneumothorax. No acute osseous abnormality. IMPRESSION: 1. Right chest tube tip in right apex. 2. Postsurgical changes with small right apical pneumothorax. Electronically Signed   By: Merilyn Baba M.D.   On: 12/11/2021 16:57   ? ?Assessment/Plan: ?S/P Procedure(s)  (LRB): ?XI ROBOTIC ASSISTED THORACOSCOPY-RIGHT LOWER LOBE SUPERIOR SEGMENT, RIGHT UPPER LOBE WEDGE (Right) ?INTERCOSTAL NERVE BLOCK (Right) ?LYMPH NODE DISSECTION ?POD#2 ? ?1 afeb, VSS, sinus rhythm ?2 sats ok on 3  liters ?3 CT 360 cc /24 h recorded, no air leak ?4 CXR pending ?5 will remove tube if CXR ok ?6 cont pulm hygiene, wean O2 - will add flutter valve ?7 lovenox for DVT ppx ?8 usual rehab protocols ? ? LOS: 2 days  ? ? ?John Giovanni PA-C ?Pager [phone] ?12/13/2021 ?  ? Chart reviewed, patient examined, agree with above. ?She feels well.  ?Chest tube has no air leak with coughing. Output yesterday a little high but minimal since early this am. CXR pending but if ok agree with removing tube. ?

## 2021-12-13 NOTE — Progress Notes (Signed)
SATURATION QUALIFICATIONS: (This note is used to comply with regulatory documentation for home oxygen) ? ?Patient Saturations on Room Air at Rest = 85% ? ?Patient Saturations on Room Air while Ambulating = 80% ? ?Patient Saturations on 3 Liters of oxygen while Ambulating = 96% ? ?Please briefly explain why patient needs home oxygen:  ? ?Patient ambulated in the hall with no oxygen and sats dropped to 80 % upon returning to the room sats climbed to 85% while tidying bed and using the restroom. Patient was able to climb up to 96% on 3 liters while sitting at the bedside ?

## 2021-12-13 NOTE — Plan of Care (Signed)
?  Problem: Education: ?Goal: Knowledge of disease or condition will improve ?Outcome: Progressing ?Goal: Knowledge of the prescribed therapeutic regimen will improve ?Outcome: Progressing ?  ?Problem: Activity: ?Goal: Risk for activity intolerance will decrease ?Outcome: Progressing ?  ?Problem: Cardiac: ?Goal: Will achieve and/or maintain hemodynamic stability ?Outcome: Progressing ?  ?Problem: Clinical Measurements: ?Goal: Postoperative complications will be avoided or minimized ?Outcome: Progressing ?  ?Problem: Respiratory: ?Goal: Respiratory status will improve ?Outcome: Progressing ?  ?Problem: Pain Management: ?Goal: Pain level will decrease ?Outcome: Progressing ?  ?Problem: Skin Integrity: ?Goal: Wound healing without signs and symptoms infection will improve ?Outcome: Progressing ?  ?

## 2021-12-13 NOTE — Plan of Care (Addendum)
?  Problem: Education: ?Goal: Knowledge of disease or condition will improve ?Outcome: Progressing ?Goal: Knowledge of the prescribed therapeutic regimen will improve ?Outcome: Progressing ?  ?Problem: Activity: ?Goal: Risk for activity intolerance will decrease ?Outcome: Progressing ?  ?Problem: Cardiac: ?Goal: Will achieve and/or maintain hemodynamic stability ?Outcome: Progressing ?  ?Problem: Clinical Measurements: ?Goal: Postoperative complications will be avoided or minimized ?Outcome: Progressing ?  ?Problem: Respiratory: ?Goal: Respiratory status will improve ?Outcome: Progressing ?  ?Problem: Pain Management: ?Goal: Pain level will decrease ?Outcome: Progressing ?  ?Problem: Skin Integrity: ?Goal: Wound healing without signs and symptoms infection will improve ?Outcome: Progressing ?  ?

## 2021-12-14 ENCOUNTER — Encounter (HOSPITAL_COMMUNITY): Payer: Self-pay | Admitting: Thoracic Surgery (Cardiothoracic Vascular Surgery)

## 2021-12-14 ENCOUNTER — Inpatient Hospital Stay (HOSPITAL_COMMUNITY): Payer: Medicare Other

## 2021-12-14 LAB — GLUCOSE, CAPILLARY
Glucose-Capillary: 137 mg/dL — ABNORMAL HIGH (ref 70–99)
Glucose-Capillary: 121 mg/dL — ABNORMAL HIGH (ref 70–99)
Glucose-Capillary: 132 mg/dL — ABNORMAL HIGH (ref 70–99)
Glucose-Capillary: 159 mg/dL — ABNORMAL HIGH (ref 70–99)

## 2021-12-14 LAB — HEMOGLOBIN A1C
Hgb A1c MFr Bld: 7 % — ABNORMAL HIGH (ref 4.8–5.6)
Mean Plasma Glucose: 154.2 mg/dL

## 2021-12-14 LAB — SURGICAL PATHOLOGY

## 2021-12-14 MED ORDER — INSULIN ASPART 100 UNIT/ML IJ SOLN
0.0000 [IU] | Freq: Three times a day (TID) | INTRAMUSCULAR | Status: DC
  Administered 2021-12-14 (×2): 2 [IU] via SUBCUTANEOUS
  Administered 2021-12-15: 3 [IU] via SUBCUTANEOUS

## 2021-12-14 NOTE — Progress Notes (Signed)
?  Mobility Specialist Criteria Algorithm Info. ? ? ? 12/14/21 1110  ?Oxygen Therapy  ?SpO2 91 %  ?O2 Device Room Air  ?Patient Activity (if Appropriate) Ambulating  ?Mobility  ?Activity Ambulated with assistance in hallway;Dangled on edge of bed  ?Range of Motion/Exercises Active;All extremities  ?Level of Assistance Standby assist, set-up cues, supervision of patient - no hands on  ?Assistive Device Front wheel walker  ?Distance Ambulated (ft) 380 ft  ?Activity Response Tolerated well  ? ?Patient received in bed eager to participate in mobility. Completed education on energy conservation and pursed lip breathing. Ambulated in hallway at supervision with slow gait. Required standing rest break x1 second to fatigue. Oxygen saturation was >90% throughout on RA. Returned to room without complaint or incident. Was left dangling EOB with all needs met, call bell in reach. ? ?12/14/2021 ?11:47 AM ? ?[name], CMS, BS EXP ?[facility]  ?EQUHK:[phone] ?Office: [phone] ? ?

## 2021-12-14 NOTE — Progress Notes (Addendum)
? ?   ?[address] ?      [place] [address] ?            [phone]   ? ?  3 Days Post-Op Procedure(s) (LRB): ?XI ROBOTIC ASSISTED THORACOSCOPY-RIGHT LOWER LOBE SUPERIOR SEGMENT, RIGHT UPPER LOBE WEDGE (Right) ?INTERCOSTAL NERVE BLOCK (Right) ?LYMPH NODE DISSECTION ?Subjective: ? ?Says she is progressing slowly. Still is still feeling weak and having a lot of right side and back soreness.  ?BM yesterday. ?O2 at 1L, desats to 79% on RA.  ? ?Objective: ?Vital signs in last 24 hours: ?Temp:  [97.6 ?F (36.4 ?C)-98.3 ?F (36.8 ?C)] 98.3 ?F (36.8 ?C) (04/24 0174) ?Pulse Rate:  [75-98] 83 (04/24 0339) ?Cardiac Rhythm: Normal sinus rhythm (04/23 2317) ?Resp:  [15-23] 20 (04/24 0339) ?BP: (119-140)/(57-76) 133/68 (04/24 0339) ?SpO2:  [93 %-97 %] 94 % (04/24 0339) ? ?Hemodynamic parameters for last 24 hours: ?  ? ?Intake/Output from previous day: ?04/23 0701 - 04/24 0700 ?In: 720 [P.O.:720] ?Out: 1900 [Urine:1900] ?Intake/Output this shift: ?No intake/output data recorded. ? ?General appearance: alert, cooperative, and no distress ?Heart: regular rate and rhythm ?Lungs: clear ?Abdomen: benign ?Extremities: no edema  ?Wound: incisions dry and  healing well ? ?Lab Results: ?Recent Labs  ?  12/13/21 ?0235  ?WBC 10.3  ?HGB 11.9*  ?HCT 36.5  ?PLT 218  ? ? ?BMET:  ?Recent Labs  ?  12/12/21 ?0038 12/13/21 ?0235  ?NA 138 142  ?K 3.9 3.9  ?CL 104 108  ?CO2 21* 27  ?GLUCOSE 192* 128*  ?BUN 13 11  ?CREATININE 0.80 0.60  ?CALCIUM 8.7* 8.9  ? ?  ?PT/INR: No results for input(s): LABPROT, INR in the last 72 hours. ?ABG ?   ?Component Value Date/Time  ? PHART 7.46 (H) 12/09/2021 1150  ? HCO3 24.9 12/09/2021 1150  ? TCO2 24 01/29/2015 1357  ? ACIDBASEDEF 1.0 11/13/2010 2157  ? O2SAT 96.9 12/09/2021 1150  ? ?CBG (last 3)  ?Recent Labs  ?  12/13/21 ?1944 12/13/21 ?2315 12/14/21 ?9449  ?GLUCAP 169* 118* 137*  ? ? ? ?Meds ?Scheduled Meds: ? acetaminophen  1,000 mg Oral Q6H  ? Or  ? acetaminophen (TYLENOL) oral liquid 160  mg/5 mL  1,000 mg Oral Q6H  ? amLODipine  10 mg Oral Daily  ? aspirin EC  81 mg Oral Daily  ? bisacodyl  10 mg Oral Daily  ? calcium-vitamin D  1 tablet Oral BID  ? enoxaparin (LOVENOX) injection  40 mg Subcutaneous Q24H  ? ezetimibe  10 mg Oral Daily  ? insulin aspart  0-24 Units Subcutaneous Q4H  ? levothyroxine  100 mcg Oral QAC breakfast  ? pantoprazole  40 mg Oral QAC breakfast  ? senna-docusate  1 tablet Oral QHS  ? ?Continuous Infusions: ?PRN Meds:.albuterol, cyclobenzaprine, meloxicam, morphine injection, ondansetron (ZOFRAN) IV, oxyCODONE, polyvinyl alcohol, traMADol ? ?Xrays ?DG Chest 1 View ? ?Result Date: 12/13/2021 ?CLINICAL DATA:  Follow-up of right thoracic surgery. EXAM: CHEST  1 VIEW COMPARISON:  December 12, 2021 FINDINGS: Right chest tube in place.  Persistent right chest wall emphysema. Minimal right apical pneumothorax. Stable postsurgical changes. Streaky interstitial opacities in bilateral lung bases. Emphysema and subpleural scarring in bilateral lung apices. Osseous structures are without acute abnormality. Soft tissues are grossly normal. IMPRESSION: 1. Minimal right apical pneumothorax. Right chest tube in place. 2. Persistent right chest wall emphysema. 3. Streaky interstitial opacities in bilateral lung bases may represent atelectasis. Electronically Signed   By: Fidela Salisbury  M.D.   On: 12/13/2021 16:17   ? ?Assessment/Plan: ?S/P Procedure(s) (LRB): ?XI ROBOTIC ASSISTED THORACOSCOPY-RIGHT LOWER LOBE SUPERIOR SEGMENT, RIGHT UPPER LOBE WEDGE (Right) ?INTERCOSTAL NERVE BLOCK (Right) ?LYMPH NODE DISSECTION ? ?POD#3 ? ?1 afeb, VSS on amlodipine, sinus rhythm ?2 sats ok on 1L [place] but desats easily without O2, working on flutter valve and IS. ?3 CT removed yesterday, CXR done this AM per pt but is not yet posted in Epic. ?4 cont pulm hygiene, wean O2  ?5 lovenox for DVT ppx ? ? ? LOS: 3 days  ? ? ?Antony Odea PA-C ?Pager [phone] ?12/14/2021 ?  ?Patient seen and examined, agree  with above ?Small pneumothorax on CXR- will repeat in AM ?Continue ambulation ? ?Revonda Standard Roxan Hockey, MD ?[facility] ?[phone] ? ?

## 2021-12-14 NOTE — Progress Notes (Signed)
SATURATION QUALIFICATIONS: (This note is used to comply with regulatory documentation for home oxygen) ? ?Patient Saturations on Room Air at Rest = 90% ? ?Patient Saturations on Room Air while Ambulating = 91% ? ?Patient Saturations on 0 Liters of oxygen while Ambulating = n/a% ? ?Please briefly explain why patient needs home oxygen: ?

## 2021-12-14 NOTE — Anesthesia Postprocedure Evaluation (Signed)
Anesthesia Post Note ? ?Patient: [name] ? ?Procedure(s) Performed: XI ROBOTIC ASSISTED THORACOSCOPY-RIGHT LOWER LOBE SUPERIOR SEGMENT, RIGHT UPPER LOBE WEDGE (Right: Chest) ?INTERCOSTAL NERVE BLOCK (Right: Chest) ?LYMPH NODE DISSECTION (Chest) ? ?  ? ?Patient location during evaluation: PACU ?Anesthesia Type: General ?Level of consciousness: awake and alert ?Pain management: pain level controlled ?Vital Signs Assessment: post-procedure vital signs reviewed and stable ?Respiratory status: spontaneous breathing, nonlabored ventilation, respiratory function stable and patient connected to nasal cannula oxygen ?Cardiovascular status: blood pressure returned to baseline and stable ?Postop Assessment: no apparent nausea or vomiting ?Anesthetic complications: no ? ? ?No notable events documented. ? ?Last Vitals:  ?Vitals:  ? 12/13/21 2317 12/14/21 0339  ?BP: 140/76 133/68  ?Pulse: 98 83  ?Resp: 20 20  ?Temp: 36.8 ?C 36.8 ?C  ?SpO2: 95% 94%  ?  ?Last Pain:  ?Vitals:  ? 12/14/21 0339  ?TempSrc: Oral  ?PainSc: 0-No pain  ? ? ?  ?  ?  ?  ?  ?  ? ?Santa Lighter ? ? ? ? ?

## 2021-12-14 NOTE — Care Management Important Message (Signed)
Important Message ? ?Patient Details  ?Name: [name] ?MRN: [id number] ?Date of Birth: 06-22-1946 ? ? ?Medicare Important Message Given:  Yes ? ? ? ? ?Tabius Rood ?12/14/2021, 3:51 PM ?

## 2021-12-15 ENCOUNTER — Inpatient Hospital Stay (HOSPITAL_COMMUNITY): Payer: Medicare Other

## 2021-12-15 ENCOUNTER — Other Ambulatory Visit (HOSPITAL_COMMUNITY): Payer: Self-pay

## 2021-12-15 LAB — GLUCOSE, CAPILLARY: Glucose-Capillary: 154 mg/dL — ABNORMAL HIGH (ref 70–99)

## 2021-12-15 MED ORDER — GABAPENTIN 100 MG PO CAPS
200.0000 mg | ORAL_CAPSULE | Freq: Three times a day (TID) | ORAL | Status: DC
  Administered 2021-12-15: 200 mg via ORAL
  Filled 2021-12-15: qty 2

## 2021-12-15 MED ORDER — TRAMADOL HCL 50 MG PO TABS
50.0000 mg | ORAL_TABLET | Freq: Four times a day (QID) | ORAL | 0 refills | Status: AC | PRN
  Filled 2021-12-15: qty 20, 5d supply, fill #0

## 2021-12-15 MED ORDER — GABAPENTIN 100 MG PO CAPS
200.0000 mg | ORAL_CAPSULE | Freq: Three times a day (TID) | ORAL | 1 refills | Status: DC
  Filled 2021-12-15: qty 60, 10d supply, fill #0

## 2021-12-15 NOTE — Progress Notes (Signed)
SATURATION QUALIFICATIONS: (This note is used to comply with regulatory documentation for home oxygen) ? ?Patient Saturations on Room Air at Rest = 91 ?% ? ?Patient Saturations on Room Air while Ambulating = 93% ? ?Patient Saturations on 0 Liters of oxygen while Ambulating = 93% ? ?Please briefly explain why patient needs home oxygen: 6 min walk   tolerated well without oxygen. ?

## 2021-12-15 NOTE — Progress Notes (Signed)
?  Mobility Specialist Criteria Algorithm Info. ? ? ? 12/15/21 1530  ?Mobility  ?Activity Ambulated with assistance in hallway  ?Range of Motion/Exercises Active;All extremities  ?Level of Assistance Standby assist, set-up cues, supervision of patient - no hands on  ?Assistive Device Front wheel walker  ?Distance Ambulated (ft) 400 ft  ?Activity Response Tolerated well  ? ?Patient received dangling EOB eager to participate in mobility. Ambulated in hallway with slow steady gait. Required cues to slow down and stay proximal to RW. Returned to room without complaint or incident. Was left with all needs met, call bell in reach. ? ?12/15/2021 ?4:01 PM ? ?[name], CMS, BS EXP ?Acute Rehabilitation Services  ?[facility]:[phone] ?Office: [phone] ? ?

## 2021-12-15 NOTE — Plan of Care (Signed)
?  Problem: Education: ?Goal: Knowledge of disease or condition will improve ?Outcome: Progressing ?Goal: Knowledge of the prescribed therapeutic regimen will improve ?Outcome: Progressing ?  ?Problem: Activity: ?Goal: Risk for activity intolerance will decrease ?Outcome: Progressing ?  ?Problem: Cardiac: ?Goal: Will achieve and/or maintain hemodynamic stability ?Outcome: Progressing ?  ?Problem: Clinical Measurements: ?Goal: Postoperative complications will be avoided or minimized ?Outcome: Progressing ?  ?Problem: Respiratory: ?Goal: Respiratory status will improve ?Outcome: Progressing ?  ?Problem: Pain Management: ?Goal: Pain level will decrease ?Outcome: Progressing ?  ?Problem: Skin Integrity: ?Goal: Wound healing without signs and symptoms infection will improve ?Outcome: Progressing ?  ?

## 2021-12-15 NOTE — Discharge Instructions (Addendum)
Discharge Instructions: ? ?1. You may shower, please wash incisions daily with soap and water and keep dry. You may remove the chest tube site dressing in 48 hours.  If you wish to cover wounds with dressing you may do so but please keep clean and change daily.  No tub baths or swimming until incisions have completely healed.  If your incisions become red or develop any drainage please call our office at [phone] ? ?2. No Driving until cleared by Dr. Leonarda Salon office and you are no longer using narcotic pain medications ? ?3. Monitor your weight daily.. Please use the same scale and weigh at same time... If you gain 5-10 lbs in 48 hours with associated lower extremity swelling, please contact our office at [phone] ? ?4. Fever of 101.5 for at least 24 hours with no source, please contact our office at [phone] ? ?5. Activity- up as tolerated, please walk at least 3 times per day.  Avoid strenuous activity, no lifting, pushing, or pulling with your arms over 8-10 lbs for a minimum of 6 weeks ? ?6. If any questions or concerns arise, please do not hesitate to contact our office at [phone]' ?

## 2021-12-15 NOTE — Progress Notes (Addendum)
? ?   ?[address] ?      [place] [address] ?            [phone]   ? ?  4 Days Post-Op Procedure(s) (LRB): ?XI ROBOTIC ASSISTED THORACOSCOPY-RIGHT LOWER LOBE SUPERIOR SEGMENT, RIGHT UPPER LOBE WEDGE (Right) ?INTERCOSTAL NERVE BLOCK (Right) ?LYMPH NODE DISSECTION ?Subjective: ? ?Still having a lot of right side and back soreness.  ?Working on IS and flutter valve. ?O2 at 2L ? ?Objective: ?Vital signs in last 24 hours: ?Temp:  [97.8 ?F (36.6 ?C)-98.4 ?F (36.9 ?C)] 98.4 ?F (36.9 ?C) (04/25 0729) ?Pulse Rate:  [68-98] 86 (04/25 0729) ?Cardiac Rhythm: Normal sinus rhythm (04/24 2059) ?Resp:  [16-26] 22 (04/25 0729) ?BP: (119-137)/(55-65) 137/61 (04/25 0729) ?SpO2:  [90 %-94 %] 91 % (04/25 0729) ? ?  ? ?Intake/Output from previous day: ?04/24 0701 - 04/25 0700 ?In: 180 [P.O.:180] ?Out: 350 [Urine:350] ?Intake/Output this shift: ?No intake/output data recorded. ? ?General appearance: alert, cooperative, and no distress ?Heart: regular rate and rhythm ?Lungs: clear, CXR showing similar right apical PTX and new small effusion. ?Abdomen: benign ?Extremities: no edema  ?Wound: incisions dry and  healing well ? ?Lab Results: ?Recent Labs  ?  12/13/21 ?0235  ?WBC 10.3  ?HGB 11.9*  ?HCT 36.5  ?PLT 218  ? ? ?BMET:  ?Recent Labs  ?  12/13/21 ?0235  ?NA 142  ?K 3.9  ?CL 108  ?CO2 27  ?GLUCOSE 128*  ?BUN 11  ?CREATININE 0.60  ?CALCIUM 8.9  ? ?  ?PT/INR: No results for input(s): LABPROT, INR in the last 72 hours. ?ABG ?   ?Component Value Date/Time  ? PHART 7.46 (H) 12/09/2021 1150  ? HCO3 24.9 12/09/2021 1150  ? TCO2 24 01/29/2015 1357  ? ACIDBASEDEF 1.0 11/13/2010 2157  ? O2SAT 96.9 12/09/2021 1150  ? ?CBG (last 3)  ?Recent Labs  ?  12/14/21 ?1625 12/14/21 ?2111 12/15/21 ?0613  ?GLUCAP 132* 159* 154*  ? ? ? ?Meds ?Scheduled Meds: ? acetaminophen  1,000 mg Oral Q6H  ? Or  ? acetaminophen (TYLENOL) oral liquid 160 mg/5 mL  1,000 mg Oral Q6H  ? amLODipine  10 mg Oral Daily  ? aspirin EC  81 mg Oral Daily  ?  bisacodyl  10 mg Oral Daily  ? calcium-vitamin D  1 tablet Oral BID  ? enoxaparin (LOVENOX) injection  40 mg Subcutaneous Q24H  ? ezetimibe  10 mg Oral Daily  ? insulin aspart  0-15 Units Subcutaneous TID WC  ? levothyroxine  100 mcg Oral QAC breakfast  ? pantoprazole  40 mg Oral QAC breakfast  ? senna-docusate  1 tablet Oral QHS  ? ?Continuous Infusions: ?PRN Meds:.albuterol, cyclobenzaprine, meloxicam, morphine injection, ondansetron (ZOFRAN) IV, oxyCODONE, polyvinyl alcohol, traMADol ? ?Xrays ?DG Chest 1 View ? ?Result Date: 12/13/2021 ?CLINICAL DATA:  Follow-up of right thoracic surgery. EXAM: CHEST  1 VIEW COMPARISON:  December 12, 2021 FINDINGS: Right chest tube in place.  Persistent right chest wall emphysema. Minimal right apical pneumothorax. Stable postsurgical changes. Streaky interstitial opacities in bilateral lung bases. Emphysema and subpleural scarring in bilateral lung apices. Osseous structures are without acute abnormality. Soft tissues are grossly normal. IMPRESSION: 1. Minimal right apical pneumothorax. Right chest tube in place. 2. Persistent right chest wall emphysema. 3. Streaky interstitial opacities in bilateral lung bases may represent atelectasis. Electronically Signed   By: Fidela Salisbury M.D.   On: 12/13/2021 16:17  ? ?DG Chest 2 View ? ?Result Date: 12/14/2021 ?CLINICAL  DATA:  COPD with hypertension. EXAM: CHEST - 2 VIEW COMPARISON:  12/13/2021 FINDINGS: 0633 hours. Right chest tube is been removed in the interval with small apical and basilar right-sided pneumothorax. Left lung clear. The cardiopericardial silhouette is within normal limits for size. Telemetry leads overlie the chest. IMPRESSION: Interval right chest tube removal with small right apical and basilar pneumothorax. Electronically Signed   By: Misty Stanley M.D.   On: 12/14/2021 08:16   ? ?Assessment/Plan: ?S/P Procedure(s) (LRB): ?XI ROBOTIC ASSISTED THORACOSCOPY-RIGHT LOWER LOBE SUPERIOR SEGMENT, RIGHT UPPER LOBE WEDGE  (Right) ?INTERCOSTAL NERVE BLOCK (Right) ?LYMPH NODE DISSECTION ? ?POD#3 ? ?1 afeb, VSS on amlodipine, sinus rhythm ?2 sats ok on 2L [place] but desats easily without O2, working on flutter valve and IS. ?3 Stable small right apical PTX.  ?4 cont pulm hygiene, wean O2  ?5 lovenox for DVT ppx ?6 Add gabapentin for pain mgt.  ? ? ? LOS: 4 days  ? ? ?Antony Odea PA-C ?Pager [phone] ?12/15/2021 ?Patient seen and examined, agree with above ?Will add gabapentin to pain regimen ?Wean O2 ?Hopefully home later today ? ?Revonda Standard Roxan Hockey, MD ?[facility] ?[phone] ? ?

## 2021-12-15 NOTE — TOC Transition Note (Signed)
Transition of Care (TOC) - CM/SW Discharge Note ? ? ?Patient Details  ?Name: [name] ?MRN: [id number] ?Date of Birth: 27-Apr-1946 ? ?Transition of Care (TOC) CM/SW Contact:  ?Angelita Ingles, RN ?Phone [phone] ? ?12/15/2021, 2:21 PM ? ? ?Clinical Narrative:    ?Patient with discharge orders. No TOC needs noted.  ? ? ?  ?  ? ? ?Patient Goals and CMS Choice ?  ?  ?  ? ?Discharge Placement ?  ?           ?  ?  ?  ?  ? ?Discharge Plan and Services ?  ?  ?           ?  ?  ?  ?  ?  ?  ?  ?  ?  ?  ? ?Social Determinants of Health (SDOH) Interventions ?  ? ? ?Readmission Risk Interventions ?   ? View : No data to display.  ?  ?  ?  ? ? ? ? ? ?

## 2021-12-16 LAB — GLUCOSE, CAPILLARY: Glucose-Capillary: 103 mg/dL — ABNORMAL HIGH (ref 70–99)

## 2021-12-22 ENCOUNTER — Ambulatory Visit: Payer: Medicare Other | Admitting: Podiatry

## 2021-12-24 ENCOUNTER — Other Ambulatory Visit: Payer: Self-pay | Admitting: *Deleted

## 2021-12-24 ENCOUNTER — Other Ambulatory Visit: Payer: Self-pay | Admitting: Thoracic Surgery (Cardiothoracic Vascular Surgery)

## 2021-12-24 ENCOUNTER — Ambulatory Visit (INDEPENDENT_AMBULATORY_CARE_PROVIDER_SITE_OTHER): Payer: Self-pay | Admitting: Thoracic Surgery (Cardiothoracic Vascular Surgery)

## 2021-12-24 ENCOUNTER — Ambulatory Visit
Admission: RE | Admit: 2021-12-24 | Discharge: 2021-12-24 | Disposition: A | Discharge status: Home / Self Care | Payer: Medicare Other | Source: Ambulatory Visit | Attending: Thoracic Surgery (Cardiothoracic Vascular Surgery) | Admitting: Thoracic Surgery (Cardiothoracic Vascular Surgery)

## 2021-12-24 VITALS — BP 130/66 | HR 100 | Resp 20 | Ht 63.0 in | Wt 133.0 lb

## 2021-12-24 DIAGNOSIS — Z09 Encounter for follow-up examination after completed treatment for conditions other than malignant neoplasm: Secondary | ICD-10-CM

## 2021-12-24 DIAGNOSIS — Z902 Acquired absence of lung [part of]: Secondary | ICD-10-CM

## 2021-12-24 DIAGNOSIS — C3491 Malignant neoplasm of unspecified part of right bronchus or lung: Secondary | ICD-10-CM

## 2021-12-24 MED ORDER — PREDNISONE 10 MG (21) PO TBPK: ORAL_TABLET | ORAL | 0 refills | Status: AC

## 2021-12-24 MED ORDER — TRAMADOL HCL 50 MG PO TABS
50.0000 mg | ORAL_TABLET | Freq: Four times a day (QID) | ORAL | 0 refills | Status: DC | PRN
Start: 2021-12-24 — End: 2023-11-28

## 2021-12-24 NOTE — Progress Notes (Signed)
The proposed treatment discussed in conference is for discussion purpose only and is not a binding recommendation. The patient was not physically examined, or presented with their treatment options. Therefore, final treatment plans cannot be decided.  ?

## 2021-12-24 NOTE — Progress Notes (Signed)
? ?   ?[name] ?      [name] [address] ?            [phone]   ? ? ?HPI: [name] returns for a scheduled follow-up visit ? ?[name] is a 76 year old woman with a history of tobacco abuse (quit 2011) who has had multiple lung nodules that been followed over time.  Recently she had had some increased density in a couple of the nodules.  On PET they were mildly reactive.  She underwent a right upper lobe wedge resection and lower lobe superior segmentectomy on 12/11/2021.  Her postoperative course was unremarkable and she went home on day 4. ? ?She continues to have some incisional pain.  She has been taking gabapentin twice daily.  Has been taking tramadol about every 6 hours.  She has not been using Tylenol or nonsteroidals.  She does complain of some shortness of breath when she tries to lie down at night. ? ?Past Medical History:  ?Diagnosis Date  ? Anemia   ? Anxiety   ? hx  ? Arthritis   ? "my whole body"  ? Atrial septal aneurysm   ? COLONIC POLYPS, HX OF   ? COPD (chronic obstructive pulmonary disease) ([name])   ? no per pt on 03/21/2014 & 02/06/2018  ? Coronary artery disease   ? a. s/p CABG 2012.  ? [name], [name]   ? Gastroesophageal reflux disease   ? Headache   ? "I have headaches all the time" (02/06/2018)  ? Heart murmur   ? as a child  ? History of rheumatic fever   ? Hyperlipidemia   ? Hypertension   ? Hypothyroidism   ? Insomnia   ? Osteoarthritis   ? OSTEOPENIA   ? PAD (peripheral artery disease) ([name])   ? a. PTA to R SFA 2015. b. PTA to L SFA 05/2016.  ? Plantar fasciitis of left foot 10/02/2019  ? Pre-diabetes   ? RSD (reflex sympathetic dystrophy) 10/31/2011  ? Vitamin D deficiency   ? ? ?Current Outpatient Medications  ?Medication Sig Dispense Refill  ? albuterol (VENTOLIN HFA) 108 (90 Base) MCG/ACT inhaler INHALE 1 PUFF BY MOUTH EVERY 6 HOURS AS NEEDED FOR SHORTNESS OF BREATH AND FOR WHEEZING    ? amLODipine (NORVASC) 10 MG tablet TAKE ONE TABLET BY  MOUTH ONCE DAILY 90 tablet 1  ? aspirin EC 81 MG tablet Take 81 mg by mouth daily.    ? Calcium Carb-Cholecalciferol (CALCIUM + D3) 600-800 MG-UNIT TABS Take 1 tablet by mouth 2 (two) times daily.    ? cyclobenzaprine (FLEXERIL) 10 MG tablet Take 10 mg by mouth at bedtime as needed for muscle spasms.    ? ezetimibe (ZETIA) 10 MG tablet Take 10 mg by mouth daily.    ? fluticasone (FLONASE) 50 MCG/ACT nasal spray USE 1 TO 2 SPRAY(S) IN EACH NOSTRIL ONCE DAILY AS NEEDED FOR 30 DAYS    ? gabapentin (NEURONTIN) 100 MG capsule Take 2 capsules (200 mg total) by mouth 3 (three) times daily. For control of nerve pain at site of surgery. 60 capsule 1  ? Lancets (ONETOUCH DELICA PLUS [id number]) MISC 1 each by Other route 2 (two) times daily.    ? levothyroxine (SYNTHROID) 100 MCG tablet Take 100 mcg by mouth daily before breakfast.    ? meloxicam (MOBIC) 7.5 MG tablet Take 7.5 mg by mouth daily as needed for pain.    ? Multiple Vitamin (MULTIVITAMIN WITH  MINERALS) TABS tablet Take 1 tablet by mouth every morning. Centrum    ? ONETOUCH VERIO test strip 1 each by Other route 2 (two) times daily.    ? pantoprazole (PROTONIX) 40 MG tablet Take 1 tablet (40 mg total) by mouth daily before breakfast. 60 tablet 1  ? predniSONE (STERAPRED UNI-PAK 21 TAB) 10 MG (21) TBPK tablet Take 6 tablets (60 mg total) by mouth daily for 1 day, THEN 5 tablets (50 mg total) daily for 1 day, THEN 4 tablets (40 mg total) daily for 1 day, THEN 3 tablets (30 mg total) daily for 1 day, THEN 2 tablets (20 mg total) daily for 1 day, THEN 1 tablet (10 mg total) daily for 1 day. 21 tablet 0  ? Propylene Glycol 0.6 % SOLN Place 1 drop into both eyes every 4 (four) hours as needed (dry eyes).    ? traMADol (ULTRAM) 50 MG tablet Take 1 tablet (50 mg total) by mouth every 6 (six) hours as needed. 25 tablet 0  ? Vitamin D, Ergocalciferol, (DRISDOL) 1.25 MG (50000 UNIT) CAPS capsule Take 50,000 Units by mouth once a week.    ? ?No current facility-administered  medications for this visit.  ? ?Facility-Administered Medications Ordered in Other Visits  ?Medication Dose Route Frequency Provider Last Rate Last Admin  ? tranexamic acid (CYKLOKAPRON) 2,000 mg in sodium chloride 0.9 % 50 mL Topical Application  3,235 mg Topical Once [place], [employer], PA-C      ? ? ?Physical Exam ?BP 130/66   Pulse 100   Resp 20   Ht 5\' 3"  (1.6 m)   Wt 133 lb (60.3 kg)   SpO2 93% Comment: RA  BMI 23.40 kg/m?  ?76 year old woman in no acute distress ?Alert and oriented x3 with no focal deficits ?Lungs diminished at right base but otherwise clear ?Incisions well-healed ?Cardiac regular rate and rhythm ? ?Diagnostic Tests: ?CHEST - 2 VIEW ?  ?COMPARISON:  Chest x-ray dated December 15, 2021. ?  ?FINDINGS: ?The heart size and mediastinal contours are within normal limits. ?Prior CABG. Normal pulmonary vascularity. Unchanged small right ?pleural effusion and right basilar atelectasis. Similar postsurgical ?changes in the right hilum and pleuroparenchymal scarring in the ?right upper lobe. No pneumothorax. No acute osseous abnormality. ?  ?IMPRESSION: ?1. Unchanged small right pleural effusion and right basilar ?atelectasis. Resolved small right pneumothorax. ?  ?  ?Electronically Signed ?  By: Titus Dubin M.D. ?  On: 12/24/2021 15:22 ?  ?I personally reviewed the chest x-ray images.  There is a small right pleural effusion. ? ?Impression: ? ?[name] is a 76 year old woman with a history of tobacco abuse (quit 2011) who has had multiple lung nodules that been followed over time.  Recently she had had some increased density in a couple of the nodules.  On PET they were mildly reactive.  She underwent a right upper lobe wedge resection and lower lobe superior segmentectomy on 12/11/2021.  Her postoperative course was unremarkable and she went home on day 4. ? ?Overall she is doing well.  She does have some incisional pain, which has not surprising in this setting.  She has been using  tramadol and often taking it before she has pain in anticipation of the pain coming.  I recommended that she use Tylenol as a baseline on a regular basis.  She should not exceed 4000 mg of Tylenol in a day.  She should also take the gabapentin as scheduled.  She can then supplement with the  tramadol as needed. ? ?I did give her a refill of the tramadol as she is almost out of those.  Prescription was for tramadol 50 mg tablets 1 p.o. every 6 hours as needed, 25 tablets, no refills. ? ?She should avoid heavy activities until her pain improves, but otherwise there are no restrictions. ? 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  They did have different morphology.  We discussed her case with our multidisciplinary conference this morning and tissue will be sent for molecular testing.  She still has multiple other nodules including a moderately suspicious nodule in the left upper lobe.  Will refer to Dr. Julien Nordmann of oncology.  Could consider SBRT, radiographic follow-up, or wedge resection for left upper lobe nodule. ? ?Her chest x-ray does show small right pleural effusion.  We are going to try a prednisone taper for that to see if we can avoid having to do a thoracentesis. ? ?Plan: ? ?Prednisone taper ?Use Tylenol on a standing basis and tramadol for supplement ?Continue gabapentin ?Turn in 3 weeks to check on progress ?Referral to Dr. Julien Nordmann for oncology consultation with multiple primary adenocarcinomas ? ?Melrose Nakayama, MD ?[facility] ?[phone] ? ? ? ? ?

## 2021-12-25 ENCOUNTER — Encounter: Payer: Self-pay | Admitting: *Deleted

## 2021-12-25 DIAGNOSIS — Z902 Acquired absence of lung [part of]: Secondary | ICD-10-CM

## 2021-12-25 NOTE — Progress Notes (Signed)
Oncology Nurse Navigator Documentation ? ? ?  12/25/2021  ?  1:00 PM  ?Oncology Nurse Navigator Flowsheets  ?Navigator Follow Up Date: 01/07/2022  ?Navigator Follow Up Reason: New Patient Appointment  ?Navigator Location [facility]  ?Referral Date to RadOnc/MedOnc 12/25/2021  ?Navigator Encounter Type Other:  ?Patient Visit Type Other  ?Barriers/Navigation Needs Coordination of Care/I received referral today on [name].  I notified new patient coordinator to call and schedule her to be seen on 5/18 with labs   ?Interventions Coordination of Care  ?Acuity Level 2-Minimal Needs (1-2 Barriers Identified)  ?Coordination of Care Other  ?Time Spent with Patient 30  ?  ?

## 2021-12-29 ENCOUNTER — Encounter: Payer: Self-pay | Admitting: *Deleted

## 2021-12-29 DIAGNOSIS — R911 Solitary pulmonary nodule: Secondary | ICD-10-CM

## 2021-12-29 NOTE — Progress Notes (Signed)
I was scheduling [name] and noticed someone scheduled her for a different day.  I called patient back with an update on the different appt time. Patient verbalized understanding of appt change.  ?

## 2021-12-29 NOTE — Progress Notes (Signed)
Oncology Nurse Navigator Documentation ? ? ?  12/29/2021  ? 10:00 AM 12/25/2021  ?  1:00 PM  ?Oncology Nurse Navigator Flowsheets  ?Navigator Follow Up Date:  01/07/2022  ?Navigator Follow Up Reason:  New Patient Appointment  ?Navigator [facility]  ?Referral Date to RadOnc/MedOnc  12/25/2021  ?Navigator Encounter Type Telephone Other:  ?Telephone Outgoing Call   ?Patient Visit Type  Other  ?Treatment Phase Other   ?Barriers/Navigation Needs Coordination of Care;Education/I followed up on Ms. Fatica's appt and saw she was not scheduled. I called patient and could not reach nor leave a vm message. I called her son who is approved on [employer].  I told him I tried to call but could not reach.  He stated he will call her to tell her we are trying to call her. He asked that he call her back later. Will call her back.  Coordination of Care  ?Education Other   ?Interventions Coordination of Care;Education Coordination of Care  ?Acuity Level 2-Minimal Needs (1-2 Barriers Identified) Level 2-Minimal Needs (1-2 Barriers Identified)  ?Coordination of Care Other Other  ?Time Spent with Patient 30 30  ?  ?

## 2021-12-29 NOTE — Progress Notes (Signed)
I received referral on [name]. I called and spoke to her. I gave her an appt to see Dr. Julien Nordmann. She verbalized understanding.  ?

## 2021-12-30 ENCOUNTER — Telehealth: Payer: Self-pay | Admitting: Internal Medicine

## 2021-12-30 NOTE — Telephone Encounter (Signed)
Scheduled appt per 5/4 referral. Pt is aware of appt date and time. Pt is aware to arrive 15 mins prior to appt time and to bring and updated insurance card. Pt is aware of appt location.   ?

## 2021-12-31 ENCOUNTER — Encounter (HOSPITAL_COMMUNITY): Payer: Self-pay

## 2021-12-31 ENCOUNTER — Encounter (HOSPITAL_COMMUNITY): Payer: Self-pay | Admitting: Internal Medicine

## 2022-01-04 ENCOUNTER — Encounter (HOSPITAL_COMMUNITY): Payer: Self-pay | Admitting: Internal Medicine

## 2022-01-07 ENCOUNTER — Other Ambulatory Visit: Payer: Self-pay

## 2022-01-07 ENCOUNTER — Other Ambulatory Visit: Payer: Medicare Other

## 2022-01-07 ENCOUNTER — Inpatient Hospital Stay: Payer: Medicare Other

## 2022-01-07 ENCOUNTER — Inpatient Hospital Stay: Payer: Medicare Other | Attending: Internal Medicine | Admitting: Internal Medicine

## 2022-01-07 ENCOUNTER — Encounter: Payer: Self-pay | Admitting: Internal Medicine

## 2022-01-07 VITALS — BP 139/71 | HR 84 | Temp 96.0°F | Resp 18 | Wt 131.1 lb

## 2022-01-07 DIAGNOSIS — C3490 Malignant neoplasm of unspecified part of unspecified bronchus or lung: Secondary | ICD-10-CM | POA: Diagnosis not present

## 2022-01-07 DIAGNOSIS — Z902 Acquired absence of lung [part of]: Secondary | ICD-10-CM

## 2022-01-07 DIAGNOSIS — C3491 Malignant neoplasm of unspecified part of right bronchus or lung: Secondary | ICD-10-CM | POA: Diagnosis not present

## 2022-01-07 DIAGNOSIS — C3431 Malignant neoplasm of lower lobe, right bronchus or lung: Secondary | ICD-10-CM | POA: Diagnosis present

## 2022-01-07 LAB — CBC WITH DIFFERENTIAL (CANCER CENTER ONLY)
Abs Immature Granulocytes: 0.03 10*3/uL (ref 0.00–0.07)
Basophils Absolute: 0.1 10*3/uL (ref 0.0–0.1)
Basophils Relative: 1 %
Eosinophils Absolute: 0.4 10*3/uL (ref 0.0–0.5)
Eosinophils Relative: 5 %
HCT: 40 % (ref 36.0–46.0)
Hemoglobin: 13.5 g/dL (ref 12.0–15.0)
Immature Granulocytes: 0 %
Lymphocytes Relative: 20 %
Lymphs Abs: 1.6 10*3/uL (ref 0.7–4.0)
MCH: 29.1 pg (ref 26.0–34.0)
MCHC: 33.8 g/dL (ref 30.0–36.0)
MCV: 86.2 fL (ref 80.0–100.0)
Monocytes Absolute: 0.5 10*3/uL (ref 0.1–1.0)
Monocytes Relative: 6 %
Neutro Abs: 5.4 10*3/uL (ref 1.7–7.7)
Neutrophils Relative %: 68 %
Platelet Count: 300 10*3/uL (ref 150–400)
RBC: 4.64 MIL/uL (ref 3.87–5.11)
RDW: 14.6 % (ref 11.5–15.5)
WBC Count: 8 10*3/uL (ref 4.0–10.5)
nRBC: 0 % (ref 0.0–0.2)

## 2022-01-07 LAB — CMP (CANCER CENTER ONLY)
ALT: 10 U/L (ref 0–44)
AST: 11 U/L — ABNORMAL LOW (ref 15–41)
Albumin: 3.8 g/dL (ref 3.5–5.0)
Alkaline Phosphatase: 83 U/L (ref 38–126)
Anion gap: 9 (ref 5–15)
BUN: 14 mg/dL (ref 8–23)
CO2: 26 mmol/L (ref 22–32)
Calcium: 9.4 mg/dL (ref 8.9–10.3)
Chloride: 105 mmol/L (ref 98–111)
Creatinine: 0.92 mg/dL (ref 0.44–1.00)
GFR, Estimated: >60 mL/min (ref >60)
Glucose, Bld: 120 mg/dL — ABNORMAL HIGH (ref 70–99)
Potassium: 3.4 mmol/L — ABNORMAL LOW (ref 3.5–5.1)
Sodium: 140 mmol/L (ref 135–145)
Total Bilirubin: 0.5 mg/dL (ref 0.3–1.2)
Total Protein: 7.3 g/dL (ref 6.5–8.1)

## 2022-01-07 NOTE — Progress Notes (Signed)
[place] [phone]   [phone]  CONSULT NOTE  REFERRING PHYSICIAN: Dr. Modesto Charon  REASON FOR CONSULTATION:  76 years old African-American female recently diagnosed with lung cancer.  HPI [name] is a 76 y.o. female with past medical history significant for COPD, hypertension, dyslipidemia, hypothyroidism, GERD, atrial septal aneurysm, osteoarthritis, anxiety, anemia, coronary artery disease, peripheral vascular disease, osteopenia and vitamin D deficiency as well as long history of smoking but quit in 2011.  The patient was evaluated at the [facility] health urgent care center for chest pain and chest x-ray at that time on July 27, 2021 showed 0.8 cm nodular density in the right upper lobe similar in appearance to the previous CT of the chest.  This was followed by CT scan of the chest without contrast on 10/01/2021 and it showed bilateral nodules slightly more conspicuous than the prior exam including 0.9 x 0.6 cm macrolobulated spiculated nodule in the posterior aspect of the right lower lobe.  There was also persistent area of ill-defined groundglass attenuation in the medial aspect of the left upper lobe within which there is a 0.9 cm solid-appearing nodule.  Few other scattered smaller 2-4 mm pulmonary nodules noted elsewhere in the lungs bilaterally that are nonspecific.  The patient had a PET scan on November 18, 2021 and it showed the right lower lobe pulmonary nodule in the posterior superior chest with mild increased metabolic activity and irregular margins suspicious for a small bronchogenic neoplasm.  The nodular area just inferior to the apical scarring separated from more confluent areas of apical scarring with some enlargement over time and is indeterminate but without substantial metabolic activity.  There was other scattered smaller pulmonary nodules without increased metabolic activity and no signs of nodal or metastatic  disease. The patient was seen by Dr. Roxan Hockey and on 12/11/2021 she underwent robotic assisted right thoracoscopy with right upper lobe wedge resection and right lower lobe superior segmentectomy with lymph node dissection. The final pathology ([id number]) showed well to moderately differentiated lung adenocarcinoma measuring 0.3 cm from the right lower lobe superior segmentectomy with no visceral pleural invasion and negative resection margin.  The right upper lobe wedge resection showed moderately differentiated lung adenocarcinoma measuring 1.1 cm with no visceral pleural involvement and negative resection margin with negative dissected lymph nodes.  There was no evidence for lymphovascular invasion.  The molecular studies sent to [facility] showed no actionable mutations and negative PD-L1 expression. Dr. Roxan Hockey kindly referred the patient to me today for evaluation and recommendation regarding management of her condition. When seen today the patient is feeling fine except for the soreness on the right side of the chest from the surgical scar.  She also has mild cough productive of clear sputum but no significant shortness of breath or hemoptysis.  She denied having any current nausea, vomiting, diarrhea or constipation.  She denied having any headache or visual changes.  She has no significant weight loss or night sweats. Family history significant for mother died in a car accident and father died from old age at age 50.  She has no other siblings. The patient is divorced and has 2 children a son and daughter.  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  She has a history of smoking 1 pack/day for around 50 years and quit in 2011 after her cardiac bypass surgery.  She has no history of alcohol or drug abuse.  HPI  Past Medical History:  Diagnosis Date   Anemia    Anxiety    hx   Arthritis    "my whole body"   Atrial septal aneurysm    COLONIC POLYPS, HX  OF    COPD (chronic obstructive pulmonary disease) (HCC)    no per pt on 03/21/2014 & 02/06/2018   Coronary artery disease    a. s/p CABG 2012.   DISC DISEASE, CERVICAL    Gastroesophageal reflux disease    Headache    "I have headaches all the time" (02/06/2018)   Heart murmur    as a child   History of rheumatic fever    Hyperlipidemia    Hypertension    Hypothyroidism    Insomnia    Osteoarthritis    OSTEOPENIA    PAD (peripheral artery disease) ([place])    a. PTA to R SFA 2015. b. PTA to L SFA 05/2016.   Plantar fasciitis of left foot 10/02/2019   Pre-diabetes    RSD (reflex sympathetic dystrophy) 10/31/2011   Vitamin D deficiency     Past Surgical History:  Procedure Laterality Date   ABDOMINAL HYSTERECTOMY  1977   "partial"   BALLOON ANGIOPLASTY, ARTERY Right 03/21/2014   SFA   CARDIAC CATHETERIZATION  2011    CARPAL TUNNEL RELEASE Right 2005   CATARACT EXTRACTION Left 2013   CORONARY ARTERY BYPASS GRAFT  10/2009   LIMA to the LAD, saphenous vein graft to the acute marginal, saphenous vein graft to the diagonal and obtuse marginal.   DILATION AND CURETTAGE OF UTERUS  1978   INTERCOSTAL NERVE BLOCK Right 12/11/2021   Procedure: INTERCOSTAL NERVE BLOCK;  Surgeon: Melrose Nakayama, MD;  Location: [place];  Service: Thoracic;  Laterality: Right;   JOINT REPLACEMENT     LOWER EXTREMITY ANGIOGRAM N/A 03/21/2014   Procedure: LOWER EXTREMITY ANGIOGRAM;  Surgeon: Lorretta Harp, MD;  Location: [facility] CATH LAB;  Service: Cardiovascular;  Laterality: N/A;   LOWER EXTREMITY ANGIOGRAM  06/07/2016   Abdominal aortogram/bilateral iliac angiogram/bifemoral runoff   LOWER EXTREMITY ANGIOGRAPHY N/A 02/06/2018   Procedure: LOWER EXTREMITY ANGIOGRAPHY;  Surgeon: Lorretta Harp, MD;  Location: [place] CV LAB;  Service: Cardiovascular;  Laterality: N/A;   LYMPH NODE DISSECTION  12/11/2021   Procedure: LYMPH NODE DISSECTION;  Surgeon: Melrose Nakayama, MD;  Location: [place];  Service:  Thoracic;;   PERIPHERAL VASCULAR ATHERECTOMY  02/06/2018   Procedure: PERIPHERAL VASCULAR ATHERECTOMY;  Surgeon: Lorretta Harp, MD;  Location: [place] CV LAB;  Service: Cardiovascular;;   PERIPHERAL VASCULAR CATHETERIZATION Bilateral 06/07/2016   Procedure: Lower Extremity Angiography;  Surgeon: Lorretta Harp, MD;  Location: [place] CV LAB;  Service: Cardiovascular;  Laterality: Bilateral;   PERIPHERAL VASCULAR CATHETERIZATION Left 06/07/2016   Procedure: Peripheral Vascular Atherectomy;  Surgeon: Lorretta Harp, MD;  Location: [place] CV LAB;  Service: Cardiovascular;  Laterality: Left;  SFA   PERIPHERAL VASCULAR CATHETERIZATION Left 06/07/2016   Procedure: Peripheral Vascular Balloon Angioplasty;  Surgeon: Lorretta Harp, MD;  Location: [place] CV LAB;  Service: Cardiovascular;  Laterality: Left;  SFA   THYROIDECTOMY  1995   TONSILLECTOMY     TOTAL KNEE ARTHROPLASTY Right 06/18/2014   Procedure: RIGHT TOTAL KNEE ARTHROPLASTY;  Surgeon: Tobi Bastos, MD;  Location: [facility];  Service: Orthopedics;  Laterality: Right;   XI ROBOTIC ASSISTED THORACOSCOPY- SEGMENTECTOMY Right 12/11/2021   Procedure: XI ROBOTIC ASSISTED THORACOSCOPY-RIGHT LOWER LOBE SUPERIOR SEGMENT, RIGHT UPPER LOBE WEDGE;  Surgeon: Melrose Nakayama, MD;  Location: [facility] OR;  Service: Thoracic;  Laterality: Right;    Family History  Problem Relation Age of Onset   Arthritis Maternal Aunt    Heart disease Maternal Aunt    Hypertension Maternal Aunt    Diabetes Maternal Aunt    Colon cancer Neg Hx    Kidney disease Neg Hx    Liver disease Neg Hx    Throat cancer Neg Hx    Stomach cancer Neg Hx    Colon polyps Neg Hx     Social History Social History   Tobacco Use   Smoking status: Former    Packs/day: 0.80    Years: 45.00    Pack years: 36.00    Types: Cigarettes    Quit date: 08/23/2009    Years since quitting: 12.3   Smokeless tobacco: Never  Vaping Use   Vaping Use: Never used   Substance Use Topics   Alcohol use: Yes    Alcohol/week: 2.0 standard drinks    Types: 2 Glasses of wine per week   Drug use: Never    Allergies  Allergen Reactions   Lipitor [Atorvastatin] Other (See Comments)    Muscle aches    Current Outpatient Medications  Medication Sig Dispense Refill   albuterol (VENTOLIN HFA) 108 (90 Base) MCG/ACT inhaler INHALE 1 PUFF BY MOUTH EVERY 6 HOURS AS NEEDED FOR SHORTNESS OF BREATH AND FOR WHEEZING     amLODipine (NORVASC) 10 MG tablet TAKE ONE TABLET BY MOUTH ONCE DAILY 90 tablet 1   aspirin EC 81 MG tablet Take 81 mg by mouth daily.     Calcium Carb-Cholecalciferol (CALCIUM + D3) 600-800 MG-UNIT TABS Take 1 tablet by mouth 2 (two) times daily.     cyclobenzaprine (FLEXERIL) 10 MG tablet Take 10 mg by mouth at bedtime as needed for muscle spasms.     ezetimibe (ZETIA) 10 MG tablet Take 10 mg by mouth daily.     fluticasone (FLONASE) 50 MCG/ACT nasal spray USE 1 TO 2 SPRAY(S) IN EACH NOSTRIL ONCE DAILY AS NEEDED FOR 30 DAYS     gabapentin (NEURONTIN) 100 MG capsule Take 2 capsules (200 mg total) by mouth 3 (three) times daily. For control of nerve pain at site of surgery. 60 capsule 1   Lancets (ONETOUCH DELICA PLUS [id number]) MISC 1 each by Other route 2 (two) times daily.     levothyroxine (SYNTHROID) 100 MCG tablet Take 100 mcg by mouth daily before breakfast.     meloxicam (MOBIC) 7.5 MG tablet Take 7.5 mg by mouth daily as needed for pain.     Multiple Vitamin (MULTIVITAMIN WITH MINERALS) TABS tablet Take 1 tablet by mouth every morning. Centrum     ONETOUCH VERIO test strip 1 each by Other route 2 (two) times daily.     pantoprazole (PROTONIX) 40 MG tablet Take 1 tablet (40 mg total) by mouth daily before breakfast. 60 tablet 1   Propylene Glycol 0.6 % SOLN Place 1 drop into both eyes every 4 (four) hours as needed (dry eyes).     traMADol (ULTRAM) 50 MG tablet Take 1 tablet (50 mg total) by mouth every 6 (six) hours as needed. 25 tablet 0    Vitamin D, Ergocalciferol, (DRISDOL) 1.25 MG (50000 UNIT) CAPS capsule Take 50,000 Units by mouth once a week.     No current facility-administered medications for this visit.   Facility-Administered Medications Ordered in Other Visits  Medication Dose Route Frequency Provider Last Rate  Last Admin   tranexamic acid (CYKLOKAPRON) 2,000 mg in sodium chloride 0.9 % 50 mL Topical Application  0,017 mg Topical Once [place], [employer], PA-C        Review of Systems  Constitutional: positive for fatigue Eyes: negative Ears, nose, mouth, throat, and face: negative Respiratory: positive for cough and pleurisy/chest pain Cardiovascular: negative Gastrointestinal: negative Genitourinary:negative Integument/breast: negative Hematologic/lymphatic: negative Musculoskeletal:positive for arthralgias Neurological: negative Behavioral/Psych: negative Endocrine: negative Allergic/Immunologic: negative  Physical Exam  CBS:WHQPR, healthy, no distress, well nourished, and well developed SKIN: skin color, texture, turgor are normal, no rashes or significant lesions HEAD: Normocephalic, No masses, lesions, tenderness or abnormalities EYES: normal, PERRLA, Conjunctiva are pink and non-injected EARS: External ears normal, Canals clear OROPHARYNX:no exudate, no erythema, and lips, buccal mucosa, and tongue normal  NECK: supple, no adenopathy, no JVD LYMPH:  no palpable lymphadenopathy, no hepatosplenomegaly BREAST:not examined LUNGS: clear to auscultation , and palpation HEART: regular rate & rhythm, no murmurs, and no gallops ABDOMEN:abdomen soft, non-tender, normal bowel sounds, and no masses or organomegaly BACK: Back symmetric, no curvature., No CVA tenderness EXTREMITIES:no joint deformities, effusion, or inflammation, no edema  NEURO: alert & oriented x 3 with fluent speech, no focal motor/sensory deficits  PERFORMANCE STATUS: ECOG 1  LABORATORY DATA: Lab Results  Component Value Date    WBC 10.3 12/13/2021   HGB 11.9 (L) 12/13/2021   HCT 36.5 12/13/2021   MCV 88.8 12/13/2021   PLT 218 12/13/2021      Chemistry      Component Value Date/Time   NA 142 12/13/2021 0235   NA 142 07/07/2021 1008   K 3.9 12/13/2021 0235   CL 108 12/13/2021 0235   CO2 27 12/13/2021 0235   BUN 11 12/13/2021 0235   BUN 9 07/07/2021 1008   CREATININE 0.60 12/13/2021 0235   CREATININE 0.71 05/25/2016 1257      Component Value Date/Time   CALCIUM 8.9 12/13/2021 0235   ALKPHOS 66 12/13/2021 0235   AST 22 12/13/2021 0235   ALT 15 12/13/2021 0235   BILITOT 0.6 12/13/2021 0235   BILITOT 0.5 07/07/2021 1008       RADIOGRAPHIC STUDIES: DG Chest 1 View  Result Date: 12/13/2021 CLINICAL DATA:  Follow-up of right thoracic surgery. EXAM: CHEST  1 VIEW COMPARISON:  December 12, 2021 FINDINGS: Right chest tube in place.  Persistent right chest wall emphysema. Minimal right apical pneumothorax. Stable postsurgical changes. Streaky interstitial opacities in bilateral lung bases. Emphysema and subpleural scarring in bilateral lung apices. Osseous structures are without acute abnormality. Soft tissues are grossly normal. IMPRESSION: 1. Minimal right apical pneumothorax. Right chest tube in place. 2. Persistent right chest wall emphysema. 3. Streaky interstitial opacities in bilateral lung bases may represent atelectasis. Electronically Signed   By: Fidela Salisbury M.D.   On: 12/13/2021 16:17   DG Chest 2 View  Result Date: 12/24/2021 CLINICAL DATA:  Status post partial right upper and lower lobe resections. EXAM: CHEST - 2 VIEW COMPARISON:  Chest x-ray dated December 15, 2021. FINDINGS: The heart size and mediastinal contours are within normal limits. Prior CABG. Normal pulmonary vascularity. Unchanged small right pleural effusion and right basilar atelectasis. Similar postsurgical changes in the right hilum and pleuroparenchymal scarring in the right upper lobe. No pneumothorax. No acute osseous abnormality.  IMPRESSION: 1. Unchanged small right pleural effusion and right basilar atelectasis. Resolved small right pneumothorax. Electronically Signed   By: Titus Dubin M.D.   On: 12/24/2021 15:22   DG Chest 2 View  Result Date: 12/15/2021 CLINICAL DATA:  Status post partial lung lobectomy EXAM: CHEST - 2 VIEW COMPARISON:  Chest x-ray 12/14/2021 FINDINGS: Patient is status post median sternotomy with evidence of prior multivessel CABG. Surgical changes of partial right lower lobectomy. Evolving expected right-sided hydropneumothorax. The pneumothorax component is improved. Slightly increased pleural effusion filling the space at the lung base. No evidence of pulmonary edema but there is mild vascular congestion. Linear opacities in the left lung base favored to reflect atelectasis. Atherosclerotic calcifications in the transverse aorta. No acute osseous abnormality. Subcutaneous emphysema along the right lateral chest wall again noted. IMPRESSION: 1. Improving right hydropneumothorax. Decreased right apical pneumothorax component and slightly increased pleural effusion component filling the space at the lung base. 2. Mild vascular congestion without overt edema. 3. Left basilar atelectasis. Electronically Signed   By: Jacqulynn Cadet M.D.   On: 12/15/2021 08:09   DG Chest 2 View  Result Date: 12/14/2021 CLINICAL DATA:  COPD with hypertension. EXAM: CHEST - 2 VIEW COMPARISON:  12/13/2021 FINDINGS: 0633 hours. Right chest tube is been removed in the interval with small apical and basilar right-sided pneumothorax. Left lung clear. The cardiopericardial silhouette is within normal limits for size. Telemetry leads overlie the chest. IMPRESSION: Interval right chest tube removal with small right apical and basilar pneumothorax. Electronically Signed   By: Misty Stanley M.D.   On: 12/14/2021 08:16   DG Chest 2 View  Result Date: 12/11/2021 CLINICAL DATA:  76 year old female with a history of preoperative chest  x-ray for lung nodule surgery EXAM: CHEST - 2 VIEW COMPARISON:  PET-CT 11/18/2021, plain film 10/21/2021 FINDINGS: Cardiomediastinal silhouette unchanged in size and contour. Surgical changes of median sternotomy and CABG. Calcifications of the aortic arch. Similar appearance of pleuroparenchymal thickening at the apices. Nodule identified on recent PET CT not well visualized. No pneumothorax pleural effusion or confluent airspace disease. Coarsened interstitial markings. No displaced fracture. IMPRESSION: Negative for acute cardiopulmonary disease. Electronically Signed   By: Corrie Mckusick D.O.   On: 12/11/2021 10:04   DG Chest Port 1 View  Result Date: 12/12/2021 CLINICAL DATA:  Postop from right lung surgery. Follow-up pneumothorax. EXAM: PORTABLE CHEST 1 VIEW COMPARISON:  12/11/2021 FINDINGS: Right chest tube remains in place. A small approximately 10% right apical pneumothorax remains stable. Surgical staples again seen in the right perihilar region. Increased subsegmental atelectasis noted in the left lung base. Mild cardiomegaly stable. Prior CABG again noted. IMPRESSION: Stable small approximately 10% right apical pneumothorax. Right chest tube remains in place. Increased left basilar subsegmental atelectasis. Electronically Signed   By: Marlaine Hind M.D.   On: 12/12/2021 09:04   DG Chest Port 1 View  Result Date: 12/11/2021 CLINICAL DATA:  Status post right thoracoscopy check chest tube placement EXAM: PORTABLE CHEST 1 VIEW COMPARISON:  12/09/2021 FINDINGS: Right chest tube terminates in the right apex. Status post median sternotomy and CABG. Unchanged cardiac and mediastinal contours. Aortic atherosclerosis. Surgical changes in the right lung. Scattered opacities, likely atelectasis. No pleural effusion. Small right apical pneumothorax. No acute osseous abnormality. IMPRESSION: 1. Right chest tube tip in right apex. 2. Postsurgical changes with small right apical pneumothorax. Electronically Signed    By: Merilyn Baba M.D.   On: 12/11/2021 16:57    ASSESSMENT: This is a very pleasant 76 years old African-American female with multifocal likely stage IV (T1b, N0, M1 a) non-small cell lung cancer, adenocarcinoma presented with multiple bilateral pulmonary nodules status post surgical resection of the 2 prominent pulmonary nodules  in the right upper lobe as well as the right lower lobe with wedge resection of the right upper lobe nodule and superior segmentectomy of the right lower lobe on December 11, 2021 under the care of Dr. Roxan Hockey. The patient had molecular studies by [facility] that showed no actionable mutations and PD-L1 expression was negative.  PLAN: I had a lengthy discussion with the patient today about her current disease stage, prognosis and treatment options. I personally and independently reviewed the scan images as well as the pathology report and discussed the result with the patient today. The patient had surgical resection of the most prominent pulmonary nodules but she continues to have small other bilateral pulmonary nodules that need close monitoring. She has no actionable mutations on the molecular studies and her PD-L1 expression was negative. I recommended for the patient to complete the staging work-up by ordering MRI of the brain to rule out brain metastasis. I recommended for the patient to continue on observation with close monitoring of the other small pulmonary nodules. I will arrange for the patient to have repeat CT scan of the chest in 6 months for restaging of her disease. She was advised to call immediately if she has any other concerning symptoms in the interval. The patient voices understanding of current disease status and treatment options and is in agreement with the current care plan.  All questions were answered. The patient knows to call the clinic with any problems, questions or concerns. We can certainly see the patient much sooner if  necessary.  Thank you so much for allowing me to participate in the care of [name]. I will continue to follow up the patient with you and assist in her care.  The total time spent in the appointment was 60 minutes.  Disclaimer: This note was dictated with voice recognition software. Similar sounding words can inadvertently be transcribed and may not be corrected upon review.   Eilleen Kempf Jan 07, 2022, 1:38 PM

## 2022-01-11 ENCOUNTER — Other Ambulatory Visit: Payer: Self-pay | Admitting: Thoracic Surgery (Cardiothoracic Vascular Surgery)

## 2022-01-11 ENCOUNTER — Encounter: Payer: Self-pay | Admitting: Podiatry

## 2022-01-11 ENCOUNTER — Ambulatory Visit: Payer: Medicare Other | Admitting: Podiatry

## 2022-01-11 ENCOUNTER — Encounter: Payer: Self-pay | Admitting: Internal Medicine

## 2022-01-11 DIAGNOSIS — M79676 Pain in unspecified toe(s): Secondary | ICD-10-CM | POA: Diagnosis not present

## 2022-01-11 DIAGNOSIS — L84 Corns and callosities: Secondary | ICD-10-CM

## 2022-01-11 DIAGNOSIS — B351 Tinea unguium: Secondary | ICD-10-CM

## 2022-01-11 DIAGNOSIS — E1151 Type 2 diabetes mellitus with diabetic peripheral angiopathy without gangrene: Secondary | ICD-10-CM | POA: Diagnosis not present

## 2022-01-11 DIAGNOSIS — C3490 Malignant neoplasm of unspecified part of unspecified bronchus or lung: Secondary | ICD-10-CM

## 2022-01-12 ENCOUNTER — Ambulatory Visit: Payer: Medicare Other

## 2022-01-12 ENCOUNTER — Ambulatory Visit: Payer: Self-pay | Admitting: Thoracic Surgery (Cardiothoracic Vascular Surgery)

## 2022-01-14 ENCOUNTER — Other Ambulatory Visit (HOSPITAL_COMMUNITY): Payer: Self-pay

## 2022-01-15 ENCOUNTER — Encounter: Payer: Self-pay | Admitting: *Deleted

## 2022-01-15 NOTE — Progress Notes (Signed)
Oncology Nurse Navigator Documentation     01/15/2022    8:00 AM 12/29/2021   10:00 AM 12/25/2021    1:00 PM  Oncology Nurse Navigator Flowsheets  Abnormal Finding Date 10/01/2021    Confirmed Diagnosis Date 12/11/2021    Diagnosis Status Confirmed Diagnosis Complete    Planned Course of Treatment Surgery    Phase of Treatment Surgery    Surgery Actual Start Date: 12/11/2021    Navigator Follow Up Date: 01/25/2022  01/07/2022  Navigator Follow Up Reason: Scan Review  New Patient Appointment  Navigator Location [place]  Referral Date to RadOnc/MedOnc   12/25/2021  Navigator Encounter Type Appt/Treatment Plan Review Telephone Other:  Telephone  Outgoing Call   Treatment Initiated Date 12/11/2021    Patient Visit Type Other  Other  Treatment Phase Other Other   Barriers/Navigation Needs Coordination of Care/Ms. Lohse was recently seen by Dr. Julien Nordmann. His recommendation for tx is for her to get MRI brain and follow up with Dr. Julien Nordmann. These appts are scheduled.  Will follow up on scan.  Coordination of Care;Education Coordination of Care  Education  Other   Interventions Coordination of Care Coordination of Care;Education Coordination of Care  Acuity Level 2-Minimal Needs (1-2 Barriers Identified) Level 2-Minimal Needs (1-2 Barriers Identified) Level 2-Minimal Needs (1-2 Barriers Identified)  Coordination of Care Other Other Other  Time Spent with Patient 30 30 30

## 2022-01-16 NOTE — Progress Notes (Signed)
  Subjective:  Patient ID: [name], female    DOB: 01-11-1946,  MRN: [id number]  [name] presents to clinic today for at risk foot care. Pt has h/o NIDDM with PAD and corn(s) right lower extremity, callus(es) left lower extremity and painful mycotic nails.  Pain interferes with ambulation. Aggravating factors include wearing enclosed shoe gear. Painful toenails interfere with ambulation. Aggravating factors include wearing enclosed shoe gear. Pain is relieved with periodic professional debridement. Painful corns and calluses are aggravated when weightbearing with and without shoegear. Pain is relieved with periodic professional debridement.  Patient states blood glucose was 112 mg/dl today.  Last known HgA1c was 7.0%.  New problem(s): None.   PCP is [name], FNP , and last visit was April, 2023.  Allergies  Allergen Reactions   Lipitor [Atorvastatin] Other (See Comments)    Muscle aches    Review of Systems: Negative except as noted in the HPI.  Objective: No changes noted in today's physical examination. There were no vitals filed for this visit.  [name] is a pleasant 76 y.o. female in NAD. AAO X 3.  Vascular Examination: CFT <3 seconds b/l LE. Diminished DP/PT pulses b/l LE. Pedal hair absent. No pain with calf compression b/l. Lower extremity skin temperature gradient within normal limits. No edema noted b/l LE. No ischemia or gangrene noted b/l LE. No cyanosis or clubbing noted b/l LE.  Dermatological Examination: Pedal integument with normal turgor, texture and tone b/l LE. No open wounds b/l. No interdigital macerations b/l. Toenails 1-5 b/l elongated, thickened, discolored with subungual debris. +Tenderness with dorsal palpation of nailplates. Hyperkeratotic lesion(s) noted R 4th toe and 1st metatarsal head left foot.  Musculoskeletal Examination: Muscle strength 5/5 to all lower extremity muscle groups bilaterally. No pain, crepitus or joint  limitation noted with ROM bilateral LE. Hammertoe deformity noted 2-5 b/l.  Neurological Examination: Protective sensation intact 5/5 intact bilaterally with 10g monofilament b/l. Vibratory sensation intact b/l.     Latest Ref Rng & Units 12/14/2021    8:00 AM  Hemoglobin A1C  Hemoglobin-A1c 4.8 - 5.6 % 7.0     Assessment/Plan: 1. Pain due to onychomycosis of toenail   2. Corns and callosities   3. Type II diabetes mellitus with peripheral circulatory disorder [facility])     -Patient was evaluated and treated. All patient's and/or POA's questions/concerns answered on today's visit. -Continue diabetic foot care principles: inspect feet daily, monitor glucose as recommended by PCP and/or Endocrinologist, and follow prescribed diet per PCP, Endocrinologist and/or dietician. -Patient to continue soft, supportive shoe gear daily. -Toenails 1-5 b/l were debrided in length and girth with sterile nail nippers and dremel without iatrogenic bleeding.  -Corn(s) R 4th toe and callus(es) 1st metatarsal head left lower extremity were pared utilizing sterile scalpel blade without incident. Total number debrided =2. -Patient/POA to call should there be question/concern in the interim.   Return in about 3 months (around 04/13/2022).  Marzetta Board, DPM

## 2022-01-19 ENCOUNTER — Ambulatory Visit
Admission: RE | Admit: 2022-01-19 | Discharge: 2022-01-19 | Disposition: A | Discharge status: Home / Self Care | Payer: Medicare Other | Source: Ambulatory Visit | Attending: Thoracic Surgery (Cardiothoracic Vascular Surgery) | Admitting: Thoracic Surgery (Cardiothoracic Vascular Surgery)

## 2022-01-19 ENCOUNTER — Ambulatory Visit (INDEPENDENT_AMBULATORY_CARE_PROVIDER_SITE_OTHER): Payer: Self-pay | Admitting: Thoracic Surgery (Cardiothoracic Vascular Surgery)

## 2022-01-19 ENCOUNTER — Encounter: Payer: Self-pay | Admitting: Thoracic Surgery (Cardiothoracic Vascular Surgery)

## 2022-01-19 VITALS — BP 148/74 | HR 90 | Resp 20 | Ht 63.0 in | Wt 131.0 lb

## 2022-01-19 DIAGNOSIS — C3491 Malignant neoplasm of unspecified part of right bronchus or lung: Secondary | ICD-10-CM

## 2022-01-19 DIAGNOSIS — Z902 Acquired absence of lung [part of]: Secondary | ICD-10-CM

## 2022-01-19 DIAGNOSIS — C3490 Malignant neoplasm of unspecified part of unspecified bronchus or lung: Secondary | ICD-10-CM

## 2022-01-19 NOTE — Progress Notes (Signed)
[address]       [place] [address]             [phone]     HPI: [name] returns for a scheduled follow-up visit  [name] is a 76 year old woman with a history of tobacco abuse (quit 2011), CAD, CABG, arthritis, anemia, hypertension, hyperlipidemia, reflux, reflex sympathetic dystrophy, and PAD.  She has had multiple lung nodules that have been followed over time.  Recently there was increased density in a couple of the nodules.  On PET they were mildly reactive.  She underwent a right upper lobe wedge resection and lower lobe superior segmentectomy on 12/11/2021.  Her postoperative course was unremarkable and she went home on day 4.  I saw her in the office on 12/24/2021.  She was still having fair amount of incisional pain at that time.  She was using tramadol about every 6 hours.  Since then her pain is improved.  Currently it really is only bothering her at night because she likes to lie on her right side she sleeps.  She is no longer taking tramadol.  Otherwise feels well.  She saw Dr. Julien Nordmann.  No adjuvant therapy was recommended, but he did want to do an MRI of the brain.   Past Medical History:  Diagnosis Date   Anemia    Anxiety    hx   Arthritis    "my whole body"   Atrial septal aneurysm    COLONIC POLYPS, HX OF    COPD (chronic obstructive pulmonary disease) (HCC)    no per pt on 03/21/2014 & 02/06/2018   Coronary artery disease    a. s/p CABG 2012.   DISC DISEASE, CERVICAL    Gastroesophageal reflux disease    Headache    "I have headaches all the time" (02/06/2018)   Heart murmur    as a child   History of rheumatic fever    Hyperlipidemia    Hypertension    Hypothyroidism    Insomnia    Osteoarthritis    OSTEOPENIA    PAD (peripheral artery disease) ([place])    a. PTA to R SFA 2015. b. PTA to L SFA 05/2016.   Plantar fasciitis of left foot 10/02/2019   Pre-diabetes    RSD (reflex sympathetic dystrophy) 10/31/2011   Vitamin  D deficiency     Current Outpatient Medications  Medication Sig Dispense Refill   albuterol (VENTOLIN HFA) 108 (90 Base) MCG/ACT inhaler INHALE 1 PUFF BY MOUTH EVERY 6 HOURS AS NEEDED FOR SHORTNESS OF BREATH AND FOR WHEEZING     amLODipine (NORVASC) 10 MG tablet TAKE ONE TABLET BY MOUTH ONCE DAILY 90 tablet 1   aspirin EC 81 MG tablet Take 81 mg by mouth daily.     Calcium Carb-Cholecalciferol (CALCIUM + D3) 600-800 MG-UNIT TABS Take 1 tablet by mouth 2 (two) times daily.     cyclobenzaprine (FLEXERIL) 10 MG tablet Take 10 mg by mouth at bedtime as needed for muscle spasms.     ezetimibe (ZETIA) 10 MG tablet Take 10 mg by mouth daily.     fluticasone (FLONASE) 50 MCG/ACT nasal spray USE 1 TO 2 SPRAY(S) IN EACH NOSTRIL ONCE DAILY AS NEEDED FOR 30 DAYS     gabapentin (NEURONTIN) 100 MG capsule Take 2 capsules (200 mg total) by mouth 3 (three) times daily. For control of nerve pain at site of surgery. 60 capsule 1   Lancets (ONETOUCH DELICA PLUS [id number])  MISC 1 each by Other route 2 (two) times daily.     levothyroxine (SYNTHROID) 100 MCG tablet Take 100 mcg by mouth daily before breakfast.     meloxicam (MOBIC) 7.5 MG tablet Take 7.5 mg by mouth daily as needed for pain.     Multiple Vitamin (MULTIVITAMIN WITH MINERALS) TABS tablet Take 1 tablet by mouth every morning. Centrum     ONETOUCH VERIO test strip 1 each by Other route 2 (two) times daily.     pantoprazole (PROTONIX) 40 MG tablet Take 1 tablet (40 mg total) by mouth daily before breakfast. 60 tablet 1   Propylene Glycol 0.6 % SOLN Place 1 drop into both eyes every 4 (four) hours as needed (dry eyes).     traMADol (ULTRAM) 50 MG tablet Take 1 tablet (50 mg total) by mouth every 6 (six) hours as needed. 25 tablet 0   Vitamin D, Ergocalciferol, (DRISDOL) 1.25 MG (50000 UNIT) CAPS capsule Take 50,000 Units by mouth once a week.     No current facility-administered medications for this visit.   Facility-Administered Medications Ordered  in Other Visits  Medication Dose Route Frequency Provider Last Rate Last Admin   tranexamic acid (CYKLOKAPRON) 2,000 mg in sodium chloride 0.9 % 50 mL Topical Application  4,356 mg Topical Once [place], [employer], PA-C        Physical Exam BP (!) 148/74 (BP Location: Right Arm, Patient Position: Sitting, Cuff Size: Normal)   Pulse 90   Resp 20   Ht 5\' 3"  (1.6 m)   Wt 131 lb (59.4 kg)   SpO2 92% Comment: RA  BMI 23.33 kg/m  76 year old woman in no acute distress Alert and oriented x3 with no focal deficits Lungs clear with equal breath sounds bilaterally Cardiac regular rate and rhythm Incisions healing well  Diagnostic Tests: I personally reviewed her chest x-ray images.  Some postoperative changes on the right but nothing of concern.  Impression: [name] with a history of tobacco abuse (quit 2011), CAD, CABG, arthritis, anemia, hypertension, hyperlipidemia, reflux, reflex sympathetic dystrophy, and PAD.    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  Has seen Dr. Julien Nordmann.  MRI of the brain is scheduled.  Plan for now is observation.  Status post robotic right VATS-recovering well.  Pain has improved.  Still having some difficulty sleeping position.  Should continue to improve with time.  Plan: Return in 6 months to check on progress  Melrose Nakayama, MD [facility] [phone]

## 2022-01-23 ENCOUNTER — Ambulatory Visit (HOSPITAL_COMMUNITY)
Admission: RE | Admit: 2022-01-23 | Discharge: 2022-01-23 | Disposition: A | Discharge status: Home / Self Care | Payer: Medicare Other | Source: Ambulatory Visit | Attending: Internal Medicine | Admitting: Internal Medicine

## 2022-01-23 DIAGNOSIS — C3490 Malignant neoplasm of unspecified part of unspecified bronchus or lung: Secondary | ICD-10-CM | POA: Insufficient documentation

## 2022-01-23 MED ORDER — GADOBUTROL 1 MMOL/ML IV SOLN
6.0000 mL | Freq: Once | INTRAVENOUS | Status: AC | PRN
  Administered 2022-01-23: 6 mL via INTRAVENOUS

## 2022-01-25 ENCOUNTER — Telehealth: Payer: Self-pay | Admitting: Medical Oncology

## 2022-01-25 NOTE — Telephone Encounter (Signed)
Per Cassie , I told pt that her MRI showed evidence of an acute and subacute right cerebellar infarct.  I called pt . She states she feels fine . She denies stoke like symptoms . She takes aspirin 81 mg /day. I also told her the MRI showed "no evidence of intracranial metastasis".  I called Shika @ Dr Claudia Pollock office and reported to her the  MRI results and faxed report to Dr Thompson Caul office.

## 2022-01-25 NOTE — Telephone Encounter (Signed)
MRI called report-  "IMPRESSION: 1. 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 2. No evidence of intracranial metastases. 3. Mild chronic small vessel ischemic disease."  Seen 05/18 -new pt . Next appt 6 months

## 2022-01-29 ENCOUNTER — Encounter: Payer: Self-pay | Admitting: *Deleted

## 2022-01-29 NOTE — Progress Notes (Signed)
Oncology Nurse Navigator Documentation     01/29/2022   10:00 AM 01/15/2022    8:00 AM 12/29/2021   10:00 AM 12/25/2021    1:00 PM  Oncology Nurse Navigator Flowsheets  Abnormal Finding Date  10/01/2021    Confirmed Diagnosis Date  12/11/2021    Diagnosis Status  Confirmed Diagnosis Complete    Planned Course of Treatment  Surgery    Phase of Treatment  Surgery    Surgery Actual Start Date:  12/11/2021    Navigator Follow Up Date:  01/25/2022  01/07/2022  Navigator Follow Up Reason:  Scan Review  New Patient Appointment  Navigation Complete Date: 01/29/2022     Post Navigation: Continue to Follow Patient? No     Reason Not Navigating Patient: No Treatment, Observation Only     Navigator Location [facility]  Referral Date to RadOnc/MedOnc    12/25/2021  Navigator Encounter Type Appt/Treatment Plan Review;Scan Review;Other: Appt/Treatment Plan Review Telephone Other:  Telephone   Outgoing Call   Treatment Initiated Date  12/11/2021    Patient Visit Type Other Other  Other  Treatment Phase Post-Tx Follow-up Other Other   Barriers/Navigation Needs Coordination of Care/I followed up on appts and she is set up for an appt with Dr. Julien Nordmann. Her MRI brain was completed and Abelina Bachelor RN updated her on results per Cassie PA-C. No thoracic oncology navigation needs at this time but if needed I am available.  Coordination of Care Coordination of Care;Education Coordination of Care  Education   Other   Interventions Coordination of Care Coordination of Care Coordination of Care;Education Coordination of Care  Acuity Level 2-Minimal Needs (1-2 Barriers Identified) Level 2-Minimal Needs (1-2 Barriers Identified) Level 2-Minimal Needs (1-2 Barriers Identified) Level 2-Minimal Needs (1-2 Barriers Identified)  Coordination of Care Other Other Other Other  Time Spent with Patient 30 30 30  30

## 2022-02-16 ENCOUNTER — Telehealth: Payer: Self-pay | Admitting: Cardiovascular Disease

## 2022-02-16 NOTE — Telephone Encounter (Signed)
Patient has not been seen since 2021. She has an appointment in August. She reports thea she has no feeling in her right hand from a surgical injury years ago; and that her hand gets cold and stings. She wants to know if Dr. Allyson Sabal will order a doppler study when she comes for her visit in august. I recommended that she contact her neurologist or PCP. Please advise.

## 2022-02-19 NOTE — Telephone Encounter (Signed)
Informed patient that Dr. Gwenlyn Found will make the decision about treatment for her hand at her August appointment with him. She had no further questions or concerns at this time.

## 2022-03-08 ENCOUNTER — Telehealth: Payer: Self-pay

## 2022-03-08 NOTE — Telephone Encounter (Signed)
Patient contacted the office asking if she should continue to take Gabapentin prescribed after surgery for pain. She is s/p RATS/ Segmentectomy/ Wedge with Dr. Roxan Hockey 4/21. She was started on the medication at discharge for pain management. She called today stating that she has stopped taking the medication 2 weeks ago. She states that she does have pain in the area at times but not everyday and she did not feel like she needed to continue to take the medication. She does take Tylenol at times and it doe help with the pain. Advised to follow-up with Dr. Roxan Hockey in clinic at scheduled time. She acknowledged receipt.

## 2022-03-15 ENCOUNTER — Telehealth: Payer: Self-pay | Admitting: *Deleted

## 2022-03-15 ENCOUNTER — Other Ambulatory Visit: Payer: Self-pay | Admitting: *Deleted

## 2022-03-15 MED ORDER — GABAPENTIN 100 MG PO CAPS: 200.0000 mg | ORAL_CAPSULE | Freq: Three times a day (TID) | ORAL | 1 refills | Status: DC

## 2022-03-15 NOTE — Telephone Encounter (Signed)
-----   Message from Melrose Nakayama, MD sent at 03/15/2022  5:06 PM EDT ----- Regarding: RE: Gabapentin refill Refill it ----- Message ----- From: Ladon Applebaum, RN Sent: 03/15/2022  11:43 AM EDT To: Melrose Nakayama, MD Subject: Gabapentin refill                              Dr. Roxan Hockey,  Ms. Koors contacted the office requesting a refill of Gabapentin- s/p RATs Segmentectomy 4/21. Patient states she stopped taking it 2 weeks ago and has noticed a difference in her pain level. States pain is sharp and tingling. Per patient, Tylenol is not helping. Next f/u with you is in November. Please advise.  Thanks, [employer]

## 2022-04-05 ENCOUNTER — Ambulatory Visit: Payer: Medicare Other | Admitting: Cardiovascular Disease

## 2022-04-19 ENCOUNTER — Ambulatory Visit: Payer: Medicare Other | Admitting: Podiatry

## 2022-04-21 ENCOUNTER — Ambulatory Visit: Payer: Medicare Other | Admitting: Podiatry

## 2022-04-21 ENCOUNTER — Encounter: Payer: Self-pay | Admitting: Podiatry

## 2022-04-21 DIAGNOSIS — E1151 Type 2 diabetes mellitus with diabetic peripheral angiopathy without gangrene: Secondary | ICD-10-CM | POA: Diagnosis not present

## 2022-04-21 DIAGNOSIS — M2011 Hallux valgus (acquired), right foot: Secondary | ICD-10-CM

## 2022-04-21 DIAGNOSIS — B351 Tinea unguium: Secondary | ICD-10-CM | POA: Diagnosis not present

## 2022-04-21 DIAGNOSIS — M79676 Pain in unspecified toe(s): Secondary | ICD-10-CM | POA: Diagnosis not present

## 2022-04-21 DIAGNOSIS — M2041 Other hammer toe(s) (acquired), right foot: Secondary | ICD-10-CM

## 2022-04-21 DIAGNOSIS — M2012 Hallux valgus (acquired), left foot: Secondary | ICD-10-CM

## 2022-04-21 NOTE — Progress Notes (Signed)
This patient returns to my office for at risk foot care.  This patient requires this care by a professional since this patient will be at risk due to having type 2 diabetes, PAD and PVD.  This patient is unable to cut nails herself since the patient cannot reach her  nails.These nails are painful walking and wearing shoes.  This patient presents for at risk foot care today.  General Appearance  Alert, conversant and in no acute stress.  Vascular  Dorsalis pedis and posterior tibial  pulses are  weakly palpable  bilaterally.  Capillary return is within normal limits  bilaterally. Temperature is within normal limits  bilaterally.  Neurologic  Senn-Weinstein monofilament wire test within normal limits  bilaterally. Muscle power within normal limits bilaterally.  Nails Thick disfigured discolored nails with subungual debris  from hallux to fifth toes bilaterally. No evidence of bacterial infection or drainage bilaterally.  Orthopedic  No limitations of motion  feet .  No crepitus or effusions noted.  No bony pathology or digital deformities noted.  Skin  normotropic skin with no porokeratosis noted bilaterally.  No signs of infections or ulcers noted.     Onychomycosis  Pain in right toes  Pain in left toes  Consent was obtained for treatment procedures.   Mechanical debridement of nails 1-5  bilaterally performed with a nail nipper.  Filed with dremel without incident.    Return office visit    3 months                  Told patient to return for periodic foot care and evaluation due to potential at risk complications.   Gardiner Barefoot DPM

## 2022-04-27 ENCOUNTER — Encounter: Payer: Self-pay | Admitting: Cardiovascular Disease

## 2022-04-27 ENCOUNTER — Ambulatory Visit: Payer: Medicare Other | Attending: Cardiovascular Disease | Admitting: Cardiovascular Disease

## 2022-04-27 VITALS — BP 134/68 | HR 71 | Ht 63.0 in | Wt 128.8 lb

## 2022-04-27 DIAGNOSIS — I739 Peripheral vascular disease, unspecified: Secondary | ICD-10-CM

## 2022-04-27 DIAGNOSIS — R0989 Other specified symptoms and signs involving the circulatory and respiratory systems: Secondary | ICD-10-CM

## 2022-04-27 NOTE — Patient Instructions (Signed)
Medication Instructions:  Your physician recommends that you continue on your current medications as directed. Please refer to the Current Medication list given to you today.  *If you need a refill on your cardiac medications before your next appointment, please call your pharmacy*   Testing/Procedures: Your physician has requested that you have a carotid duplex. This test is an ultrasound of the carotid arteries in your neck. It looks at blood flow through these arteries that supply the brain with blood. Allow one hour for this exam. There are no restrictions or special instructions. This procedure will be done at [place]. [address]  Your physician has requested that you have a lower extremity arterial duplex. This test is an ultrasound of the arteries in the legs. It looks at arterial blood flow in the legs. Allow one hour for Lower Arterial scans. There are no restrictions or special instructions. To be done in January 2024. This procedure will be done at [place]  Your physician has requested that you have an ankle brachial index (ABI). During this test an ultrasound and blood pressure cuff are used to evaluate the arteries that supply the arms and legs with blood. Allow thirty minutes for this exam. There are no restrictions or special instructions. To be done in January 2024. This procedure will be done at [place]. [address]   Follow-Up: At [facility], you and your health needs are our priority.  As part of our continuing mission to provide you with exceptional heart care, we have created designated Provider Care Teams.  These Care Teams include your primary Cardiologist (physician) and Advanced Practice Providers (APPs -  Physician Assistants and Nurse Practitioners) who all work together to provide you with the care you need, when you need it.  We recommend signing up for the patient portal called "MyChart".  Sign up information is provided on  this After Visit Summary.  MyChart is used to connect with patients for Virtual Visits (Telemedicine).  Patients are able to view lab/test results, encounter notes, upcoming appointments, etc.  Non-urgent messages can be sent to your provider as well.   To learn more about what you can do with MyChart, go to [email or web address].    Your next appointment:   12 month(s)  The format for your next appointment:   In Person  Provider:   Quay Burow, MD

## 2022-04-27 NOTE — Assessment & Plan Note (Signed)
[name] is a patient Dr. Jacalyn Lefevre who I see for PAD.  I performed multiple interventions on her dating back to 68.  Her most recent intervention was 02/06/2018.  She does not complain of symptoms of restless leg syndrome but has minimal claudication.  Has been at her most recent Dopplers performed 08/25/2021 revealed a right ABI of 0.85 and a left of 0.77 with moderate disease in her SFAs bilaterally.  At this point, I do not think she is symptomatic enough to proceed with repeat intervention.  We will continue to follow her noninvasively.  I will see her back in 1 year.

## 2022-04-27 NOTE — Progress Notes (Signed)
04/27/2022 [name]   1946/04/22  [id number]  Primary Physician [name], FNP Primary Cardiologist: Lorretta Harp MD Garret Reddish, [place], [place]  HPI:  [name] is a 76 y.o.   African American female patient of Dr. Jacalyn Lefevre referred for peripheral vascular evaluation. She was seen by Dr. Gladstone Lighter  orthopedic surgeon, who referred her here. I last saw her in the office   05/14/2020. Her past history is remarkable for ischemic heart disease status post coronary bypass grafting in March 2012 of the LIMA to LAD, vein to the diagonal branch, obtuse marginal branch and a vein to acute marginal branch. She has normal LV function by 2-D echo. Further problems include hypertension, and hyperlipidemia. She complains of right lower extremity claudication and had seen Dr. Fletcher Anon in the past who thought that her pain was arthritic in nature. Lower extremity arterial Doppler studies performed 07/07/14 revealed a right ABI 0.43 with an occluded distal right SFA and popliteal artery, left ABI 0.73 with a high-frequency signal in the mid left SFA. I performed angiography on her 753/15 revealing an occluded left SFA with one vessel runoff via the peroneal. I performed [place]bo[place] directional atherectomy followed by PTA using drug-eluting balloon with excellent angiographic result. Her followup Doppler revealed an increase in her right ABI from 0.4 to .7 with resolution of her claudication symptoms. Since I saw her over 2 years ago she developed claudication in her left leg now. I suspected that her previously  documented lesions in her left external leg artery and SFA have progressed. She underwent angiography by myself 06/07/16 revealing 80% segmental mid left SFA stenosis. I performed [facility] 1 directional atherectomy followed by drug-eluting balloon into plasty. She will vessel runoff via peroneal. She did have 50% distal left common iliac artery stenosis without a gradient. Her follow-up lower  extremity Doppler studies performed 06/24/16 were markedly improved ABI 1.04.   Because of right lower extremity claudication and markedly reduced right ABI of 0.46 she underwent angiography by myself on 02/06/2018 really 90% distal right common femoral artery stenosis, total occlusion in the distal right SFA with one-vessel runoff via peroneal.  I performed directional atherectomy followed by drug-eluting balloon angioplasty of the distal SFA occlusion and chocolate balloon angioplasty of the distal right common femoral artery.  She had follow-up Doppler studies performed in our office 08/25/2017 revealed marked improvement with a right ABI of 0.82.  Her claudication has  markedly improved.   Since I saw her 2 years ago she continues to do well.  She does have symptoms of restless leg syndrome and some mild claudication.  Recent Dopplers performed 08/25/2021 revealed a right ABI of 0.85 and a left of 0.77 with moderate SFA disease bilaterally.  In addition, her recent cholesterol level performed 07/07/2021 was 218 with an LDL of 128 only on Zetia.  I will leave it to Dr. Stanford Breed to further manage her lipid profile.  Current Meds  Medication Sig   amLODipine (NORVASC) 10 MG tablet TAKE ONE TABLET BY MOUTH ONCE DAILY   aspirin EC 81 MG tablet Take 81 mg by mouth daily.   Calcium Carb-Cholecalciferol (CALCIUM + D3) 600-800 MG-UNIT TABS Take 1 tablet by mouth 2 (two) times daily.   cyclobenzaprine (FLEXERIL) 10 MG tablet Take 10 mg by mouth at bedtime as needed for muscle spasms.   ezetimibe (ZETIA) 10 MG tablet Take 10 mg by mouth daily.   fluticasone (FLONASE) 50 MCG/ACT nasal spray USE 1  TO 2 SPRAY(S) IN EACH NOSTRIL ONCE DAILY AS NEEDED FOR 30 DAYS   gabapentin (NEURONTIN) 100 MG capsule Take 2 capsules (200 mg total) by mouth 3 (three) times daily. For control of nerve pain at site of surgery.   Lancets (ONETOUCH DELICA PLUS [id number]) MISC 1 each by Other route 2 (two) times daily.   levothyroxine  (SYNTHROID) 150 MCG tablet Take 150 mcg by mouth daily in the afternoon.   Multiple Vitamin (MULTIVITAMIN WITH MINERALS) TABS tablet Take 1 tablet by mouth every morning. Centrum   ONETOUCH VERIO test strip 1 each by Other route 2 (two) times daily.   pantoprazole (PROTONIX) 40 MG tablet Take 1 tablet (40 mg total) by mouth daily before breakfast.   Vitamin D, Ergocalciferol, (DRISDOL) 1.25 MG (50000 UNIT) CAPS capsule Take 50,000 Units by mouth once a week.     Allergies  Allergen Reactions   Lipitor [Atorvastatin] Other (See Comments)    Muscle aches    Social History   Socioeconomic History   Marital status: Divorced    Spouse name: Not on file   Number of children: 2   Years of education: 14   Highest education level: Not on file  Occupational History   Occupation: Retired    Fish farm manager: RETIRED  Tobacco Use   Smoking status: Former    Packs/day: 0.80    Years: 45.00    Total pack years: 36.00    Types: Cigarettes    Quit date: 08/23/2009    Years since quitting: 12.6   Smokeless tobacco: Never  Vaping Use   Vaping Use: Never used  Substance and Sexual Activity   Alcohol use: Yes    Alcohol/week: 2.0 standard drinks of alcohol    Types: 2 Glasses of wine per week   Drug use: Never   Sexual activity: Not Currently  Other Topics Concern   Not on file  Social History Narrative   Not on file   Social Determinants of Health   Financial Resource Strain: Not on file  Food Insecurity: Not on file  Transportation Needs: Not on file  Physical Activity: Not on file  Stress: Not on file  Social Connections: Not on file  Intimate Partner Violence: Not on file     Review of Systems: General: negative for chills, fever, night sweats or weight changes.  Cardiovascular: negative for chest pain, dyspnea on exertion, edema, orthopnea, palpitations, paroxysmal nocturnal dyspnea or shortness of breath Dermatological: negative for rash Respiratory: negative for cough or  wheezing Urologic: negative for hematuria Abdominal: negative for nausea, vomiting, diarrhea, bright red blood per rectum, melena, or hematemesis Neurologic: negative for visual changes, syncope, or dizziness All other systems reviewed and are otherwise negative except as noted above.    Blood pressure 134/68, pulse 71, height 5\' 3"  (1.6 m), weight 128 lb 12.8 oz (58.4 kg), SpO2 91 %.  General appearance: alert and no distress Neck: no adenopathy, no JVD, supple, symmetrical, trachea midline, thyroid not enlarged, symmetric, no tenderness/mass/nodules, and left carotid bruit Lungs: clear to auscultation bilaterally Heart: regular rate and rhythm, S1, S2 normal, no murmur, click, rub or gallop Extremities: extremities normal, atraumatic, no cyanosis or edema Pulses: 2+ and symmetric Skin: Skin color, texture, turgor normal. No rashes or lesions Neurologic: Grossly normal  EKG sinus rhythm at 71 without ST or T wave changes.  There was poor R wave progression and small nondiagnostic inferior Q waves.  I personally reviewed this EKG.  ASSESSMENT AND PLAN:   Claudication  in peripheral vascular disease [facility]) Ms. Lubin is a patient Dr. Jacalyn Lefevre who I see for PAD.  I performed multiple interventions on her dating back to 25.  Her most recent intervention was 02/06/2018.  She does not complain of symptoms of restless leg syndrome but has minimal claudication.  Has been at her most recent Dopplers performed 08/25/2021 revealed a right ABI of 0.85 and a left of 0.77 with moderate disease in her SFAs bilaterally.  At this point, I do not think she is symptomatic enough to proceed with repeat intervention.  We will continue to follow her noninvasively.  I will see her back in 1 year.     Lorretta Harp MD FACP,FACC,FAHA, [facility] 04/27/2022 11:11 AM

## 2022-05-12 ENCOUNTER — Ambulatory Visit (HOSPITAL_COMMUNITY)
Admission: RE | Admit: 2022-05-12 | Discharge: 2022-05-12 | Disposition: A | Discharge status: Home / Self Care | Payer: Medicare Other | Source: Ambulatory Visit | Attending: Cardiovascular Disease | Admitting: Cardiovascular Disease

## 2022-05-12 ENCOUNTER — Inpatient Hospital Stay (HOSPITAL_COMMUNITY): Admission: RE | Admit: 2022-05-12 | Payer: Medicare Other | Source: Ambulatory Visit

## 2022-05-12 DIAGNOSIS — R0989 Other specified symptoms and signs involving the circulatory and respiratory systems: Secondary | ICD-10-CM | POA: Diagnosis present

## 2022-07-06 ENCOUNTER — Other Ambulatory Visit: Payer: Self-pay

## 2022-07-06 ENCOUNTER — Ambulatory Visit (HOSPITAL_COMMUNITY)
Admission: RE | Admit: 2022-07-06 | Discharge: 2022-07-06 | Disposition: A | Discharge status: Home / Self Care | Payer: Medicare Other | Source: Ambulatory Visit | Attending: Internal Medicine | Admitting: Internal Medicine

## 2022-07-06 ENCOUNTER — Inpatient Hospital Stay: Payer: Medicare Other | Attending: Internal Medicine

## 2022-07-06 DIAGNOSIS — C3490 Malignant neoplasm of unspecified part of unspecified bronchus or lung: Secondary | ICD-10-CM | POA: Diagnosis present

## 2022-07-06 DIAGNOSIS — Z08 Encounter for follow-up examination after completed treatment for malignant neoplasm: Secondary | ICD-10-CM | POA: Insufficient documentation

## 2022-07-06 DIAGNOSIS — Z85118 Personal history of other malignant neoplasm of bronchus and lung: Secondary | ICD-10-CM | POA: Insufficient documentation

## 2022-07-06 LAB — CMP (CANCER CENTER ONLY)
ALT: 9 U/L (ref 0–44)
AST: 15 U/L (ref 15–41)
Albumin: 4.1 g/dL (ref 3.5–5.0)
Alkaline Phosphatase: 83 U/L (ref 38–126)
Anion gap: 9 (ref 5–15)
BUN: 11 mg/dL (ref 8–23)
CO2: 22 mmol/L (ref 22–32)
Calcium: 8.8 mg/dL — ABNORMAL LOW (ref 8.9–10.3)
Chloride: 108 mmol/L (ref 98–111)
Creatinine: 0.66 mg/dL (ref 0.44–1.00)
GFR, Estimated: >60 mL/min (ref >60)
Glucose, Bld: 112 mg/dL — ABNORMAL HIGH (ref 70–99)
Potassium: 3.8 mmol/L (ref 3.5–5.1)
Sodium: 139 mmol/L (ref 135–145)
Total Bilirubin: 0.7 mg/dL (ref 0.3–1.2)
Total Protein: 7.2 g/dL (ref 6.5–8.1)

## 2022-07-06 LAB — CBC WITH DIFFERENTIAL (CANCER CENTER ONLY)
Abs Immature Granulocytes: 0.02 10*3/uL (ref 0.00–0.07)
Basophils Absolute: 0 10*3/uL (ref 0.0–0.1)
Basophils Relative: 1 %
Eosinophils Absolute: 0.2 10*3/uL (ref 0.0–0.5)
Eosinophils Relative: 4 %
HCT: 42.2 % (ref 36.0–46.0)
Hemoglobin: 14 g/dL (ref 12.0–15.0)
Immature Granulocytes: 0 %
Lymphocytes Relative: 38 %
Lymphs Abs: 1.8 10*3/uL (ref 0.7–4.0)
MCH: 29 pg (ref 26.0–34.0)
MCHC: 33.2 g/dL (ref 30.0–36.0)
MCV: 87.6 fL (ref 80.0–100.0)
Monocytes Absolute: 0.4 10*3/uL (ref 0.1–1.0)
Monocytes Relative: 7 %
Neutro Abs: 2.5 10*3/uL (ref 1.7–7.7)
Neutrophils Relative %: 50 %
Platelet Count: 207 10*3/uL (ref 150–400)
RBC: 4.82 MIL/uL (ref 3.87–5.11)
RDW: 14.5 % (ref 11.5–15.5)
WBC Count: 4.9 10*3/uL (ref 4.0–10.5)
nRBC: 0 % (ref 0.0–0.2)

## 2022-07-06 MED ORDER — IOHEXOL 300 MG/ML  SOLN
75.0000 mL | Freq: Once | INTRAMUSCULAR | Status: AC | PRN
  Administered 2022-07-06: 75 mL via INTRAVENOUS

## 2022-07-06 MED ORDER — SODIUM CHLORIDE (PF) 0.9 % IJ SOLN
INTRAMUSCULAR | Status: AC
  Filled 2022-07-06: qty 50

## 2022-07-08 ENCOUNTER — Other Ambulatory Visit: Payer: Self-pay

## 2022-07-08 ENCOUNTER — Inpatient Hospital Stay: Payer: Medicare Other | Admitting: Internal Medicine

## 2022-07-08 ENCOUNTER — Encounter: Payer: Self-pay | Admitting: Internal Medicine

## 2022-07-08 VITALS — BP 142/77 | HR 80 | Temp 98.3°F | Resp 16 | Wt 129.4 lb

## 2022-07-08 DIAGNOSIS — Z85118 Personal history of other malignant neoplasm of bronchus and lung: Secondary | ICD-10-CM | POA: Diagnosis present

## 2022-07-08 DIAGNOSIS — Z08 Encounter for follow-up examination after completed treatment for malignant neoplasm: Secondary | ICD-10-CM | POA: Diagnosis not present

## 2022-07-08 DIAGNOSIS — C3490 Malignant neoplasm of unspecified part of unspecified bronchus or lung: Secondary | ICD-10-CM | POA: Diagnosis not present

## 2022-07-08 NOTE — Progress Notes (Signed)
[place] Telephone:(336) [phone]   Fax:(336) [phone]  OFFICE PROGRESS NOTE  [name], FNP [place] [address]  DIAGNOSIS: Multifocal likely stage IV (T1b, N0, M1 a) non-small cell lung cancer, adenocarcinoma presented with multiple bilateral pulmonary nodules diagnosed in April 2023. The patient had molecular studies by foundation 1 that showed no actionable mutations and PD-L1 expression was negative.  PRIOR THERAPY:  Status post surgical resection of the 2 prominent pulmonary nodules in the right upper lobe as well as the right lower lobe with wedge resection of the right upper lobe nodule and superior segmentectomy of the right lower lobe on December 11, 2021 under the care of Dr. Roxan Hockey.  CURRENT THERAPY: Observation.  INTERVAL HISTORY: [name] 76 y.o. female returns to the clinic today for follow-up visit.  The patient is feeling fine today with no concerning complaints.  She denied having any current chest pain, shortness of breath, cough or hemoptysis.  She has no nausea, vomiting, diarrhea or constipation.  She has no headache or visual changes.  She has very mild fatigue.  She is here today for evaluation with repeat CT scan of the chest for restaging of her disease.  MEDICAL HISTORY: Past Medical History:  Diagnosis Date   Anemia    Anxiety    hx   Arthritis    "my whole body"   Atrial septal aneurysm    COLONIC POLYPS, HX OF    COPD (chronic obstructive pulmonary disease) (HCC)    no per pt on 03/21/2014 & 02/06/2018   Coronary artery disease    a. s/p CABG 2012.   DISC DISEASE, CERVICAL    Gastroesophageal reflux disease    Headache    "I have headaches all the time" (02/06/2018)   Heart murmur    as a child   History of rheumatic fever    Hyperlipidemia    Hypertension    Hypothyroidism    Insomnia    Osteoarthritis    OSTEOPENIA    PAD (peripheral artery disease) ([place])    a. PTA to R SFA 2015. b.  PTA to L SFA 05/2016.   Plantar fasciitis of left foot 10/02/2019   Pre-diabetes    RSD (reflex sympathetic dystrophy) 10/31/2011   Vitamin D deficiency     ALLERGIES:  is allergic to lipitor [atorvastatin].  MEDICATIONS:  Current Outpatient Medications  Medication Sig Dispense Refill   albuterol (VENTOLIN HFA) 108 (90 Base) MCG/ACT inhaler INHALE 1 PUFF BY MOUTH EVERY 6 HOURS AS NEEDED FOR SHORTNESS OF BREATH AND FOR WHEEZING (Patient not taking: Reported on 04/27/2022)     amLODipine (NORVASC) 10 MG tablet TAKE ONE TABLET BY MOUTH ONCE DAILY 90 tablet 1   aspirin EC 81 MG tablet Take 81 mg by mouth daily.     Calcium Carb-Cholecalciferol (CALCIUM + D3) 600-800 MG-UNIT TABS Take 1 tablet by mouth 2 (two) times daily.     cyclobenzaprine (FLEXERIL) 10 MG tablet Take 10 mg by mouth at bedtime as needed for muscle spasms.     diclofenac Sodium (VOLTAREN) 1 % GEL daily as needed. (Patient not taking: Reported on 04/27/2022)     ezetimibe (ZETIA) 10 MG tablet Take 10 mg by mouth daily.     fluticasone (FLONASE) 50 MCG/ACT nasal spray USE 1 TO 2 SPRAY(S) IN EACH NOSTRIL ONCE DAILY AS NEEDED FOR 30 DAYS     gabapentin (NEURONTIN) 100 MG capsule Take 2 capsules (200  mg total) by mouth 3 (three) times daily. For control of nerve pain at site of surgery. 60 capsule 1   Lancets (ONETOUCH DELICA PLUS [id number]) MISC 1 each by Other route 2 (two) times daily.     levothyroxine (SYNTHROID) 100 MCG tablet Take 100 mcg by mouth daily before breakfast. (Patient not taking: Reported on 04/27/2022)     levothyroxine (SYNTHROID) 150 MCG tablet Take 150 mcg by mouth daily in the afternoon.     meloxicam (MOBIC) 7.5 MG tablet Take 7.5 mg by mouth daily as needed for pain. (Patient not taking: Reported on 04/27/2022)     Multiple Vitamin (MULTIVITAMIN WITH MINERALS) TABS tablet Take 1 tablet by mouth every morning. Centrum     ondansetron (ZOFRAN) 4 MG tablet Take 4 mg by mouth every 8 (eight) hours as needed. (Patient  not taking: Reported on 04/27/2022)     ONETOUCH VERIO test strip 1 each by Other route 2 (two) times daily.     pantoprazole (PROTONIX) 40 MG tablet Take 1 tablet (40 mg total) by mouth daily before breakfast. 60 tablet 1   Propylene Glycol 0.6 % SOLN Place 1 drop into both eyes every 4 (four) hours as needed (dry eyes). (Patient not taking: Reported on 04/27/2022)     traMADol (ULTRAM) 50 MG tablet Take 1 tablet (50 mg total) by mouth every 6 (six) hours as needed. (Patient not taking: Reported on 04/27/2022) 25 tablet 0   Vitamin D, Ergocalciferol, (DRISDOL) 1.25 MG (50000 UNIT) CAPS capsule Take 50,000 Units by mouth once a week.     No current facility-administered medications for this visit.   Facility-Administered Medications Ordered in Other Visits  Medication Dose Route Frequency Provider Last Rate Last Admin   tranexamic acid (CYKLOKAPRON) 2,000 mg in sodium chloride 0.9 % 50 mL Topical Application  2,595 mg Topical Once [place], [employer], PA-C        SURGICAL HISTORY:  Past Surgical History:  Procedure Laterality Date   ABDOMINAL HYSTERECTOMY  1977   "partial"   BALLOON ANGIOPLASTY, ARTERY Right 03/21/2014   SFA   CARDIAC CATHETERIZATION  2011    CARPAL TUNNEL RELEASE Right 2005   CATARACT EXTRACTION Left 2013   CORONARY ARTERY BYPASS GRAFT  10/2009   LIMA to the LAD, saphenous vein graft to the acute marginal, saphenous vein graft to the diagonal and obtuse marginal.   DILATION AND CURETTAGE OF UTERUS  1978   INTERCOSTAL NERVE BLOCK Right 12/11/2021   Procedure: INTERCOSTAL NERVE BLOCK;  Surgeon: Melrose Nakayama, MD;  Location: [facility];  Service: Thoracic;  Laterality: Right;   JOINT REPLACEMENT     LOWER EXTREMITY ANGIOGRAM N/A 03/21/2014   Procedure: LOWER EXTREMITY ANGIOGRAM;  Surgeon: Lorretta Harp, MD;  Location: [facility] CATH LAB;  Service: Cardiovascular;  Laterality: N/A;   LOWER EXTREMITY ANGIOGRAM  06/07/2016   Abdominal aortogram/bilateral iliac angiogram/bifemoral  runoff   LOWER EXTREMITY ANGIOGRAPHY N/A 02/06/2018   Procedure: LOWER EXTREMITY ANGIOGRAPHY;  Surgeon: Lorretta Harp, MD;  Location: [place] CV LAB;  Service: Cardiovascular;  Laterality: N/A;   LYMPH NODE DISSECTION  12/11/2021   Procedure: LYMPH NODE DISSECTION;  Surgeon: Melrose Nakayama, MD;  Location: [place];  Service: Thoracic;;   PERIPHERAL VASCULAR ATHERECTOMY  02/06/2018   Procedure: PERIPHERAL VASCULAR ATHERECTOMY;  Surgeon: Lorretta Harp, MD;  Location: [place] CV LAB;  Service: Cardiovascular;;   PERIPHERAL VASCULAR CATHETERIZATION Bilateral 06/07/2016   Procedure: Lower Extremity Angiography;  Surgeon: Lorretta Harp, MD;  Location: [place] CV LAB;  Service: Cardiovascular;  Laterality: Bilateral;   PERIPHERAL VASCULAR CATHETERIZATION Left 06/07/2016   Procedure: Peripheral Vascular Atherectomy;  Surgeon: Lorretta Harp, MD;  Location: [place] CV LAB;  Service: Cardiovascular;  Laterality: Left;  SFA   PERIPHERAL VASCULAR CATHETERIZATION Left 06/07/2016   Procedure: Peripheral Vascular Balloon Angioplasty;  Surgeon: Lorretta Harp, MD;  Location: [place] CV LAB;  Service: Cardiovascular;  Laterality: Left;  SFA   THYROIDECTOMY  1995   TONSILLECTOMY     TOTAL KNEE ARTHROPLASTY Right 06/18/2014   Procedure: RIGHT TOTAL KNEE ARTHROPLASTY;  Surgeon: Tobi Bastos, MD;  Location: [facility];  Service: Orthopedics;  Laterality: Right;   XI ROBOTIC ASSISTED THORACOSCOPY- SEGMENTECTOMY Right 12/11/2021   Procedure: XI ROBOTIC ASSISTED THORACOSCOPY-RIGHT LOWER LOBE SUPERIOR SEGMENT, RIGHT UPPER LOBE WEDGE;  Surgeon: Melrose Nakayama, MD;  Location: [place];  Service: Thoracic;  Laterality: Right;    REVIEW OF SYSTEMS:  A comprehensive review of systems was negative except for: Constitutional: positive for fatigue   PHYSICAL EXAMINATION: General appearance: alert, cooperative, and no distress Head: Normocephalic, without obvious abnormality,  atraumatic Neck: no adenopathy, no JVD, supple, symmetrical, trachea midline, and thyroid not enlarged, symmetric, no tenderness/mass/nodules Lymph nodes: Cervical, supraclavicular, and axillary nodes normal. Resp: clear to auscultation bilaterally Back: symmetric, no curvature. ROM normal. No CVA tenderness. Cardio: regular rate and rhythm, S1, S2 normal, no murmur, click, rub or gallop GI: soft, non-tender; bowel sounds normal; no masses,  no organomegaly Extremities: extremities normal, atraumatic, no cyanosis or edema  ECOG PERFORMANCE STATUS: 1 - Symptomatic but completely ambulatory  Blood pressure (!) 142/77, pulse 80, temperature 98.3 F (36.8 C), resp. rate 16, weight 129 lb 6.4 oz (58.7 kg), SpO2 96 %.  LABORATORY DATA: Lab Results  Component Value Date   WBC 4.9 07/06/2022   HGB 14.0 07/06/2022   HCT 42.2 07/06/2022   MCV 87.6 07/06/2022   PLT 207 07/06/2022      Chemistry      Component Value Date/Time   NA 139 07/06/2022 0821   NA 142 07/07/2021 1008   K 3.8 07/06/2022 0821   CL 108 07/06/2022 0821   CO2 22 07/06/2022 0821   BUN 11 07/06/2022 0821   BUN 9 07/07/2021 1008   CREATININE 0.66 07/06/2022 0821   CREATININE 0.71 05/25/2016 1257      Component Value Date/Time   CALCIUM 8.8 (L) 07/06/2022 0821   ALKPHOS 83 07/06/2022 0821   AST 15 07/06/2022 0821   ALT 9 07/06/2022 0821   BILITOT 0.7 07/06/2022 0821       RADIOGRAPHIC STUDIES: CT Chest W Contrast  Result Date: 07/07/2022 CLINICAL DATA:  Status post right lower lobe segmentectomy for non-small-cell lung cancer in April. * Tracking Code: BO * EXAM: CT CHEST WITH CONTRAST TECHNIQUE: Multidetector CT imaging of the chest was performed during intravenous contrast administration. RADIATION DOSE REDUCTION: This exam was performed according to the departmental dose-optimization program which includes automated exposure control, adjustment of the mA and/or kV according to patient size and/or use of  iterative reconstruction technique. CONTRAST:  74m OMNIPAQUE IOHEXOL 300 MG/ML  SOLN COMPARISON:  11/18/2021 PET. FINDINGS: Cardiovascular: Aortic atherosclerosis. Ulcerative plaque in the transverse and descending thoracic aorta. Mild cardiomegaly, without pericardial effusion. Median sternotomy for CABG. No central pulmonary embolism, on this non-dedicated study. Mediastinum/Nodes: Thyroidectomy. No mediastinal or hilar adenopathy. Esophageal fluid level on 59/2 Lungs/Pleura: No pleural fluid.  Advanced bullous emphysema. Right lower lobe segmentectomy. Biapical  pleuroparenchymal scarring. Scattered right-sided pulmonary nodules are felt to be similar, given altered anatomy and positioning. The largest in the right upper lobe at 4 mm on 57/5 and right lower lobe at 5 mm on 95/5. The nodule positioned within the left upper lobe, just caudal to pleuroparenchymal scarring, is similar at 8 mm on 30/5. Upper Abdomen: Hepatic cysts. Central hypoattenuation on 156/2 is favored to represent the cephalad most aspect of the gallbladder when correlated with prior PET. Normal imaged portions of the spleen, stomach, pancreas, kidneys. Mild bilateral adrenal thickening and nodularity are unchanged and were not FDG avid on prior PET. Musculoskeletal: Convex right thoracic spine curvature. IMPRESSION: 1. Status post right lower lobe segmentectomy, without recurrent or metastatic disease. 2. The left apical pulmonary nodule is similar at 8 mm and warrants attention on follow-up. 3. No thoracic adenopathy. 4. Aortic atherosclerosis (ICD10-I70.0) and emphysema (ICD10-J43.9). 5. Esophageal air fluid level suggests dysmotility or gastroesophageal reflux. Electronically Signed   By: Abigail Miyamoto M.D.   On: 07/07/2022 10:30    ASSESSMENT AND PLAN: This is a very pleasant 76 years old African-American female diagnosed with multifocal likely stage IV (T1b, N0, M1 a) non-small cell lung cancer, adenocarcinoma presented with multiple  bilateral pulmonary nodules status post surgical resection of the 2 prominent pulmonary nodules in the right upper lobe as well as the right lower lobe with wedge resection of the right upper lobe nodule and superior segmentectomy of the right lower lobe on December 11, 2021 under the care of Dr. Roxan Hockey. The patient had molecular studies by [facility] 1 that showed no actionable mutations and PD-L1 expression was negative. The patient is currently on observation and she is feeling fine with no concerning complaints. She had repeat CT scan of the chest performed recently.  I personally and independently reviewed the scan and discussed the result with the patient today. Her scan showed no concerning findings for disease progression but she continues to have the left apical pulmonary nodule that is similar in size to the prior imaging studies. I recommended for the patient to continue on observation with repeat CT scan of the chest in 6 months. The patient was advised to call immediately if she has any other concerning symptoms in the interval. The patient voices understanding of current disease status and treatment options and is in agreement with the current care plan.  All questions were answered. The patient knows to call the clinic with any problems, questions or concerns. We can certainly see the patient much sooner if necessary.  The total time spent in the appointment was 20 minutes.  Disclaimer: This note was dictated with voice recognition software. Similar sounding words can inadvertently be transcribed and may not be corrected upon review.

## 2022-07-20 ENCOUNTER — Ambulatory Visit: Payer: Medicare Other | Admitting: Thoracic Surgery (Cardiothoracic Vascular Surgery)

## 2022-07-22 NOTE — Progress Notes (Deleted)
Cardiology Office Note:    Date:  07/22/2022   ID:  [name], DOB 1946-05-29, MRN [id number]  PCP:  [name], FNP  Cardiologist:  Quay Burow, MD  Electrophysiologist:  None   Referring MD: [name], FNP   Chief Complaint: follow-up of CAD  History of Present Illness:    [name] is a 76 y.o. female with a history of CAD s/p CABG x4 (LIMA to LAD, SVG to acute marginal branch, and sequential SVG to 1st Diag and OM) in 10/2010, PAD s/p multiple lower extremity interventions (atherectomy/ PTA of right SFA in 06/2014, atherectomy/ PTA of left SFA in 05/2016, and atherectomy/ PTA of distal right SFA occlusion and PTA of the distal right common femoral artery in 01/2018), bilateral carotid stenosis (left > right), hypertension, hyperlipidemia, pre-diabetes, COPD, hypothyroidism, GERD, and headaches who is followed by Dr. Stanford Breed and Dr. Gwenlyn Found and presents today for routine follow-up.  Patient has a history of CAD with CABG x4 in 2012 as described above. Last ischemic evaluation was a Myoview in 10/2020 which was low risk with no evidence of ischemia or prior infarct. She also has known PAD involving bilateral lower extremities and carotid arteries. She has undergone multiple interventions of bilateral lower extremities with Dr. Gwenlyn Found dating back to 2015 as described above. Last lower extremity dopplers in 08/2021 showed mixed plaque throughout both lower extremities with 50-74% stenosis of the right proximal SFA with patent distal SFA as well as 75-99% stenosis of the proximal to mid left SFA. ABIs were 0.85 on the right and 0.77 on the left. There were no significant changes compared to prior study. Last carotid ultrasound in 04/2022 showed 40-59% stenosis of the left ICA and 1-39% stenosis of the right ICA as well as occlusion of the right vertebral artery.  Patient was last seen by Dr. Gwenlyn Found in 04/2022 at which time he reported some mild claudication but she was not  felt to be symptomatic enough to proceed with repeat intervention.   Patient presents today for routine follow-up. ***  CAD S/p CABG x4 in 2012. Myoview in 10/2020 was low risk with no evidence of ischemia  - No chest pain.  - Continue Aspirin 81mg  daily and Zetia 10mg  daily (intolerant to statins in the past***).  PAD S/p multiple lower extremity interventions dating back to 2015. Last lower extremity dopplers in 08/2021 showed mixed plaque throughout both lower extremities with 50-74% stenosis of the right proximal SFA with patent distal SFA as well as 75-99% stenosis of the proximal to mid left SFA. ABIs were 0.85 on the right and 0.77 on the left.  - *** - Continue Aspirin and Zetia as above. - Followed by Dr. Gwenlyn Found. Plan is for repeat dopplers/ ABI in 08/2022.   Carotid Stenosis Last carotid ultrasound in 04/2022 showed 40-59% stenosis of the left ICA and 1-39% stenosis of the right ICA as well as occlusion of the right vertebral artery. - Continue Aspirin and Zetia as above.  Hypertension BP *** - Continue Amlodipine 10mg  daily.   Hyperlipidemia Most recent lipid panel in *** - Continue Zetia 10mg  daily. - Crestor ***   Past Medical History:  Diagnosis Date   Anemia    Anxiety    hx   Arthritis    "my whole body"   Atrial septal aneurysm    COLONIC POLYPS, HX OF    COPD (chronic obstructive pulmonary disease) (HCC)    no per pt on  03/21/2014 & 02/06/2018   Coronary artery disease    a. s/p CABG 2012.   DISC DISEASE, CERVICAL    Gastroesophageal reflux disease    Headache    "I have headaches all the time" (02/06/2018)   Heart murmur    as a child   History of rheumatic fever    Hyperlipidemia    Hypertension    Hypothyroidism    Insomnia    Osteoarthritis    OSTEOPENIA    PAD (peripheral artery disease) ([place])    a. PTA to R SFA 2015. b. PTA to L SFA 05/2016.   Plantar fasciitis of left foot 10/02/2019   Pre-diabetes    RSD (reflex sympathetic dystrophy)  10/31/2011   Vitamin D deficiency     Past Surgical History:  Procedure Laterality Date   ABDOMINAL HYSTERECTOMY  1977   "partial"   BALLOON ANGIOPLASTY, ARTERY Right 03/21/2014   SFA   CARDIAC CATHETERIZATION  2011    CARPAL TUNNEL RELEASE Right 2005   CATARACT EXTRACTION Left 2013   CORONARY ARTERY BYPASS GRAFT  10/2009   LIMA to the LAD, saphenous vein graft to the acute marginal, saphenous vein graft to the diagonal and obtuse marginal.   DILATION AND CURETTAGE OF UTERUS  1978   INTERCOSTAL NERVE BLOCK Right 12/11/2021   Procedure: INTERCOSTAL NERVE BLOCK;  Surgeon: Melrose Nakayama, MD;  Location: [facility];  Service: Thoracic;  Laterality: Right;   JOINT REPLACEMENT     LOWER EXTREMITY ANGIOGRAM N/A 03/21/2014   Procedure: LOWER EXTREMITY ANGIOGRAM;  Surgeon: Lorretta Harp, MD;  Location: [facility];  Service: Cardiovascular;  Laterality: N/A;   LOWER EXTREMITY ANGIOGRAM  06/07/2016   Abdominal aortogram/bilateral iliac angiogram/bifemoral runoff   LOWER EXTREMITY ANGIOGRAPHY N/A 02/06/2018   Procedure: LOWER EXTREMITY ANGIOGRAPHY;  Surgeon: Lorretta Harp, MD;  Location: [facility];  Service: Cardiovascular;  Laterality: N/A;   LYMPH NODE DISSECTION  12/11/2021   Procedure: LYMPH NODE DISSECTION;  Surgeon: Melrose Nakayama, MD;  Location: [facility];  Service: Thoracic;;   PERIPHERAL VASCULAR ATHERECTOMY  02/06/2018   Procedure: PERIPHERAL VASCULAR ATHERECTOMY;  Surgeon: Lorretta Harp, MD;  Location: [facility];  Service: Cardiovascular;;   PERIPHERAL VASCULAR CATHETERIZATION Bilateral 06/07/2016   Procedure: Lower Extremity Angiography;  Surgeon: Lorretta Harp, MD;  Location: [facility];  Service: Cardiovascular;  Laterality: Bilateral;   PERIPHERAL VASCULAR CATHETERIZATION Left 06/07/2016   Procedure: Peripheral Vascular Atherectomy;  Surgeon: Lorretta Harp, MD;  Location: [facility];  Service: Cardiovascular;  Laterality: Left;  SFA    PERIPHERAL VASCULAR CATHETERIZATION Left 06/07/2016   Procedure: Peripheral Vascular Balloon Angioplasty;  Surgeon: Lorretta Harp, MD;  Location: [facility];  Service: Cardiovascular;  Laterality: Left;  SFA   THYROIDECTOMY  1995   TONSILLECTOMY     TOTAL KNEE ARTHROPLASTY Right 06/18/2014   Procedure: RIGHT TOTAL KNEE ARTHROPLASTY;  Surgeon: Tobi Bastos, MD;  Location: [facility];  Service: Orthopedics;  Laterality: Right;   XI ROBOTIC ASSISTED THORACOSCOPY- SEGMENTECTOMY Right 12/11/2021   Procedure: XI ROBOTIC ASSISTED THORACOSCOPY-RIGHT LOWER LOBE SUPERIOR SEGMENT, RIGHT UPPER LOBE WEDGE;  Surgeon: Melrose Nakayama, MD;  Location: [facility];  Service: Thoracic;  Laterality: Right;    Current Medications: No outpatient medications have been marked as taking for the 07/28/22 encounter (Appointment) with Darreld Mclean, PA-C.     Allergies:   Lipitor [atorvastatin]   Social History   Socioeconomic History  Marital status: Divorced    Spouse name: Not on file   Number of children: 2   Years of education: 14   Highest education level: Not on file  Occupational History   Occupation: Retired    Fish farm manager: RETIRED  Tobacco Use   Smoking status: Former    Packs/day: 0.80    Years: 45.00    Total pack years: 36.00    Types: Cigarettes    Quit date: 08/23/2009    Years since quitting: 12.9   Smokeless tobacco: Never  Vaping Use   Vaping Use: Never used  Substance and Sexual Activity   Alcohol use: Yes    Alcohol/week: 2.0 standard drinks of alcohol    Types: 2 Glasses of wine per week   Drug use: Never   Sexual activity: Not Currently  Other Topics Concern   Not on file  Social History Narrative   Not on file   Social Determinants of Health   Financial Resource Strain: Not on file  Food Insecurity: Not on file  Transportation Needs: Not on file  Physical Activity: Not on file  Stress: Not on file  Social Connections: Not on file     Family  History: The patient's family history includes Arthritis in her maternal aunt; Diabetes in her maternal aunt; Heart disease in her maternal aunt; Hypertension in her maternal aunt. There is no history of Colon cancer, Kidney disease, Liver disease, Throat cancer, Stomach cancer, or Colon polyps.  ROS:   Please see the history of present illness.     EKGs/Labs/Other Studies Reviewed:    The following studies were reviewed:  Myoview 10/29/2020: Nuclear stress EF: 68%. The left ventricular ejection fraction is hyperdynamic (>65%). This is a low risk study. There is no evidence of ischemia or previous infarction. The study is normal. _______________  Lower Extremity Dopplers/ ABIs 08/25/2021: Summary:  Right: No significant change compared to previous study.  Mixed plaque throughout.  50-74% stenosis in the proximal SFA, s/p angioplasty.  Patent distal SFA without evidence of stenosis, s/p atherectomy and  angioplasty.   Left: No significant change as compared to previous study.  Mixed plaque throughout.  75-99% stenosis in the proximal/mid SFA, based on velocity ratio of 4.4,  s/p mid SFA atherectomy.  Unable to duplicate elevated velocities in the distal SFA from prior exam;  distal SFA appears patent without evidence of stenosis, s/p atherectomy  and angioplasty.   ABI Summary: Right: Resting right ankle-brachial index indicates mild right lower  extremity arterial disease (0.85). The right toe-brachial index is normal.   TBIs increased by .17.  Left: Resting left ankle-brachial index indicates moderate left lower  extremity arterial disease (0.77). The left toe-brachial index is abnormal.  _______________  Carotid Ultrasounds 05/12/2022: - Right Carotid: Velocities in the right ICA are consistent with a 1-39%  stenosis.  - Left Carotid: Velocities in the left ICA are consistent with a 40-59%  stenosis. Non-hemodynamically significant plaque <50% noted in the  CCA. The ECA  appears >50% stenosed. ICA stenosis based on peak  Systolic velocities and plaque formation.  - Vertebrals:  Left vertebral artery demonstrates antegrade flow. Right vertebral  artery demonstrates an occlusion.  - Subclavians: Normal flow hemodynamics were seen in bilateral subclavian arteries.    EKG:  EKG not ordered today.   Recent Labs: 07/06/2022: ALT 9; BUN 11; Creatinine 0.66; Hemoglobin 14.0; Platelet Count 207; Potassium 3.8; Sodium 139  Recent Lipid Panel    Component Value Date/Time   CHOL  218 (H) 07/07/2021 1008   TRIG 156 (H) 07/07/2021 1008   HDL 63 07/07/2021 1008   CHOLHDL 3.5 07/07/2021 1008   CHOLHDL 2 10/15/2015 1106   VLDL 21.0 10/15/2015 1106   LDLCALC 128 (H) 07/07/2021 1008   LDLDIRECT 126.5 11/10/2012 0826    Physical Exam:    Vital Signs: There were no vitals taken for this visit.    Wt Readings from Last 3 Encounters:  07/08/22 129 lb 6.4 oz (58.7 kg)  04/27/22 128 lb 12.8 oz (58.4 kg)  01/19/22 131 lb (59.4 kg)     General: 76 y.o. female in no acute distress. HEENT: Normocephalic and atraumatic. Sclera clear. EOMs intact. Neck: Supple. No carotid bruits. No JVD. Heart: *** RRR. Distinct S1 and S2. No murmurs, gallops, or rubs. Radial and distal pedal pulses 2+ and equal bilaterally. Lungs: No increased work of breathing. Clear to ausculation bilaterally. No wheezes, rhonchi, or rales.  Abdomen: Soft, non-distended, and non-tender to palpation. Bowel sounds present in all 4 quadrants.  MSK: Normal strength and tone for age. *** Extremities: No lower extremity edema.    Skin: Warm and dry. Neuro: Alert and oriented x3. No focal deficits. Psych: Normal affect. Responds appropriately.   Assessment:    No diagnosis found.  Plan:     Disposition: Follow up in ***   Medication Adjustments/Labs and Tests Ordered: Current medicines are reviewed at length with the patient today.  Concerns regarding medicines are outlined above.  No orders  of the defined types were placed in this encounter.  No orders of the defined types were placed in this encounter.   There are no Patient Instructions on file for this visit.   Signed, Darreld Mclean, PA-C  07/22/2022 5:01 PM    [facility]

## 2022-07-25 NOTE — Progress Notes (Unsigned)
Cardiology Clinic Note   Patient Name: [name] Date of Encounter: 07/28/2022  Primary Care Provider:  [name], FNP Primary Cardiologist:  Quay Burow, MD  Patient Profile    [name] is a 76 y.o. female with a past medical history of CAD s/p CABG x 4 2012, PAD s/p multiple lower extremity interventions (2015, 2017, 2019), bilateral carotid stenosis, hypertension, hyperlipidemia, COPD, hypothyroidism s/p thyroidectomy 2002, and GERD who presents to the clinic today for one year follow-up of chronic cardiac conditions.   Past Medical History    Past Medical History:  Diagnosis Date   Anemia    Anxiety    hx   Arthritis    "my whole body"   Atrial septal aneurysm    COLONIC POLYPS, HX OF    COPD (chronic obstructive pulmonary disease) (HCC)    no per pt on 03/21/2014 & 02/06/2018   Coronary artery disease    a. s/p CABG 2012.   DISC DISEASE, CERVICAL    Gastroesophageal reflux disease    Headache    "I have headaches all the time" (02/06/2018)   Heart murmur    as a child   History of rheumatic fever    Hyperlipidemia    Hypertension    Hypothyroidism    Insomnia    Osteoarthritis    OSTEOPENIA    PAD (peripheral artery disease) ([place])    a. PTA to R SFA 2015. b. PTA to L SFA 05/2016.   Plantar fasciitis of left foot 10/02/2019   Pre-diabetes    RSD (reflex sympathetic dystrophy) 10/31/2011   Vitamin D deficiency    Past Surgical History:  Procedure Laterality Date   ABDOMINAL HYSTERECTOMY  1977   "partial"   BALLOON ANGIOPLASTY, ARTERY Right 03/21/2014   SFA   CARDIAC CATHETERIZATION  2011    CARPAL TUNNEL RELEASE Right 2005   CATARACT EXTRACTION Left 2013   CORONARY ARTERY BYPASS GRAFT  10/2009   LIMA to the LAD, saphenous vein graft to the acute marginal, saphenous vein graft to the diagonal and obtuse marginal.   DILATION AND CURETTAGE OF UTERUS  1978   INTERCOSTAL NERVE BLOCK Right 12/11/2021   Procedure: INTERCOSTAL NERVE BLOCK;   Surgeon: Melrose Nakayama, MD;  Location: [facility];  Service: Thoracic;  Laterality: Right;   JOINT REPLACEMENT     LOWER EXTREMITY ANGIOGRAM N/A 03/21/2014   Procedure: LOWER EXTREMITY ANGIOGRAM;  Surgeon: Lorretta Harp, MD;  Location: [facility] CATH LAB;  Service: Cardiovascular;  Laterality: N/A;   LOWER EXTREMITY ANGIOGRAM  06/07/2016   Abdominal aortogram/bilateral iliac angiogram/bifemoral runoff   LOWER EXTREMITY ANGIOGRAPHY N/A 02/06/2018   Procedure: LOWER EXTREMITY ANGIOGRAPHY;  Surgeon: Lorretta Harp, MD;  Location: [facility] CV LAB;  Service: Cardiovascular;  Laterality: N/A;   LYMPH NODE DISSECTION  12/11/2021   Procedure: LYMPH NODE DISSECTION;  Surgeon: Melrose Nakayama, MD;  Location: [facility];  Service: Thoracic;;   PERIPHERAL VASCULAR ATHERECTOMY  02/06/2018   Procedure: PERIPHERAL VASCULAR ATHERECTOMY;  Surgeon: Lorretta Harp, MD;  Location: [facility];  Service: Cardiovascular;;   PERIPHERAL VASCULAR CATHETERIZATION Bilateral 06/07/2016   Procedure: Lower Extremity Angiography;  Surgeon: Lorretta Harp, MD;  Location: [facility] CV [facility];  Service: Cardiovascular;  Laterality: Bilateral;   PERIPHERAL VASCULAR CATHETERIZATION Left 06/07/2016   Procedure: Peripheral Vascular Atherectomy;  Surgeon: Lorretta Harp, MD;  Location: [facility];  Service: Cardiovascular;  Laterality: Left;  SFA  PERIPHERAL VASCULAR CATHETERIZATION Left 06/07/2016   Procedure: Peripheral Vascular Balloon Angioplasty;  Surgeon: Lorretta Harp, MD;  Location: [facility];  Service: Cardiovascular;  Laterality: Left;  SFA   THYROIDECTOMY  1995   TONSILLECTOMY     TOTAL KNEE ARTHROPLASTY Right 06/18/2014   Procedure: RIGHT TOTAL KNEE ARTHROPLASTY;  Surgeon: Tobi Bastos, MD;  Location: [facility];  Service: Orthopedics;  Laterality: Right;   XI ROBOTIC ASSISTED THORACOSCOPY- SEGMENTECTOMY Right 12/11/2021   Procedure: XI ROBOTIC ASSISTED THORACOSCOPY-RIGHT LOWER LOBE  SUPERIOR SEGMENT, RIGHT UPPER LOBE WEDGE;  Surgeon: Melrose Nakayama, MD;  Location: [facility];  Service: Thoracic;  Laterality: Right;    Allergies  Allergies  Allergen Reactions   Lipitor [Atorvastatin] Other (See Comments)    Muscle aches    History of Present Illness    [name] has a past medical history of: CAD. CABG x 4 11/14/2010: LIMA to LAD, SVG to acute marginal branch, sequential SVG to 1st diag and OM. Nuclear stress test 10/29/2020: Normal low risk study. No evidence of ischemia or previous infarct.  PAD. 03/21/2014: Arthrectomy/PTA distal right SFA. 06/07/2016: Arthrectomy/PTA left SFA.  02/06/2018: Arthrectomy/PTA distal right SFA, PTA distal right common femoral/proximal right SFA.  Lower extremity arterial US 08/25/2021: Right lower extremity unchanged from previous - 50-47% stenosis proximal SFA, patent distal SFA. Left lower extremity unchanged from previous - 75-99% stenosis proximal/mid SFA, patent distal SFA.  Bilateral carotid stenosis. Carotid duplex US 03/13/2018: Bilatearl ICA 1-39% stenosis. Left ECA >50% stenosis.  Carotid duplex US 05/12/2022: Right ICA 1-39% stenosis. Left ICA 40-59% stenosis. Left ECA >50% stenosis. Right vertebral artery demonstrates an occlusion.  Recommend repeat in 1 year. ABIs unchanged from previous.  Hypertension. Hyperlipidemia.  Lipid panel 07/07/2021: LDL 128, HDL 63, TG 156, total 218.  COPD. History of lung CA.  Hypothyroidism.  S/p thyroidectomy 2002. GERD.   [name] is a long time patient of Dr. Stanford Breed since 2010 as well as a patient of Dr. Gwenlyn Found. She has a history of CABG x 4 in 2012. Her last nuclear stress test in March 2022 was a normal, low risk study. She has a history of multiple lower extremity interventions with Dr. Gwenlyn Found as above. Her last intervention was in 2019. At her last office visit with Dr. Gwenlyn Found on 04/27/2022 she complained of symptoms of RLS and mild claudication but she was not symptomatic enough  to intervene. Noninvasive, medical management continued. She last saw Dr. Stanford Breed in the office on 07/07/2021 and no changes to her medical regimen were made.   Today, patient reports she is doing well. Patient denies shortness of breath. No chest pain, pressure, or tightness. Denies orthopnea, or PND. No palpitations. She reports some dyspnea with heavy exertion that requires her to briefly rest before continuing activities. This is not new for her. It was most notable on the week of Thanksgiving when she did a lot of walking with her daughter. She states she was able to do all of the walking but every once in a while had to stop and rest. She handles all her personal hygiene, ADLs and housework without difficulty. She has not exercised in the last several years and she does not eat a low sodium diet eating a lot of canned soup. She reports some lower extremity edema on occasion right > left. She reports claudication in right lower extremity and cramping medially particularly at night. She feels this is worse than it was in September. She is  due for repeat lower extremity US and will schedule that along with follow-up with Dr. Gwenlyn Found today. Discussed checking lipids today. She has not been taking her Zetia for the last two weeks and prior to that she takes it sporadically "maybe every other day or so."    Home Medications    Current Meds  Medication Sig   albuterol (VENTOLIN HFA) 108 (90 Base) MCG/ACT inhaler    amLODipine (NORVASC) 10 MG tablet TAKE ONE TABLET BY MOUTH ONCE DAILY   aspirin EC 81 MG tablet Take 81 mg by mouth daily.   Calcium Carb-Cholecalciferol (CALCIUM + D3) 600-800 MG-UNIT TABS Take 1 tablet by mouth 2 (two) times daily.   cyclobenzaprine (FLEXERIL) 10 MG tablet Take 10 mg by mouth at bedtime as needed for muscle spasms.   diclofenac Sodium (VOLTAREN) 1 % GEL daily as needed.   ezetimibe (ZETIA) 10 MG tablet Take 10 mg by mouth daily.   fluticasone (FLONASE) 50 MCG/ACT nasal  spray USE 1 TO 2 SPRAY(S) IN EACH NOSTRIL ONCE DAILY AS NEEDED FOR 30 DAYS   gabapentin (NEURONTIN) 100 MG capsule Take 2 capsules (200 mg total) by mouth 3 (three) times daily. For control of nerve pain at site of surgery.   Lancets (ONETOUCH DELICA PLUS PPIRJJ88C) MISC 1 each by Other route 2 (two) times daily.   levothyroxine (SYNTHROID) 100 MCG tablet Take 100 mcg by mouth daily before breakfast.   meloxicam (MOBIC) 7.5 MG tablet Take 7.5 mg by mouth daily as needed for pain.   Multiple Vitamin (MULTIVITAMIN WITH MINERALS) TABS tablet Take 1 tablet by mouth every morning. Centrum   ondansetron (ZOFRAN) 4 MG tablet Take 4 mg by mouth every 8 (eight) hours as needed.   ONETOUCH VERIO test strip 1 each by Other route 2 (two) times daily.   pantoprazole (PROTONIX) 40 MG tablet Take 1 tablet (40 mg total) by mouth daily before breakfast.   Propylene Glycol 0.6 % SOLN Place 1 drop into both eyes every 4 (four) hours as needed (dry eyes).   traMADol (ULTRAM) 50 MG tablet Take 1 tablet (50 mg total) by mouth every 6 (six) hours as needed.   Vitamin D, Ergocalciferol, (DRISDOL) 1.25 MG (50000 UNIT) CAPS capsule Take 50,000 Units by mouth once a week.    Family History    Family History  Problem Relation Age of Onset   Arthritis Maternal Aunt    Heart disease Maternal Aunt    Hypertension Maternal Aunt    Diabetes Maternal Aunt    Colon cancer Neg Hx    Kidney disease Neg Hx    Liver disease Neg Hx    Throat cancer Neg Hx    Stomach cancer Neg Hx    Colon polyps Neg Hx    She indicated that her mother is deceased. She indicated that her father is deceased. She indicated that her maternal grandmother is deceased. She indicated that her maternal grandfather is deceased. She indicated that her paternal grandmother is deceased. She indicated that her paternal grandfather is deceased. She indicated that her daughter is alive. She indicated that her son is alive. She indicated that one of her five  maternal aunts is deceased. She indicated that the status of her neg hx is unknown.   Social History    Social History   Socioeconomic History   Marital status: Divorced    Spouse name: Not on file   Number of children: 2   Years of education: 14   Highest  education level: Not on file  Occupational History   Occupation: Retired    Fish farm manager: RETIRED  Tobacco Use   Smoking status: Former    Packs/day: 0.80    Years: 45.00    Total pack years: 36.00    Types: Cigarettes    Quit date: 08/23/2009    Years since quitting: 12.9   Smokeless tobacco: Never  Vaping Use   Vaping Use: Never used  Substance and Sexual Activity   Alcohol use: Yes    Alcohol/week: 2.0 standard drinks of alcohol    Types: 2 Glasses of wine per week   Drug use: Never   Sexual activity: Not Currently  Other Topics Concern   Not on file  Social History Narrative   Not on file   Social Determinants of Health   Financial Resource Strain: Not on file  Food Insecurity: Not on file  Transportation Needs: Not on file  Physical Activity: Not on file  Stress: Not on file  Social Connections: Not on file  Intimate Partner Violence: Not on file     Review of Systems    General:  No chills, fever, night sweats or weight changes.  Cardiovascular:  No chest pain, orthopnea, palpitations, paroxysmal nocturnal dyspnea. Occasional lower extremity edema right > left. Mild DOE with heavy exertion. Positive claudication right lower extremity.  Dermatological: No rash, lesions/masses Respiratory: No cough, dyspnea Urologic: No hematuria, dysuria Abdominal:   No nausea, vomiting, diarrhea, bright red blood per rectum, melena, or hematemesis Neurologic:  No visual changes, weakness, changes in mental status. All other systems reviewed and are otherwise negative except as noted above.  Physical Exam    VS:  BP 124/82   Pulse 74   Ht 5\' 3"  (1.6 m)   Wt 131 lb (59.4 kg)   SpO2 97%   BMI 23.21 kg/m  , BMI Body  mass index is 23.21 kg/m. GEN:  Well nourished, well developed, in no acute distress. HEENT: Normal. Neck: Supple, no JVD, carotid bruits, or masses. Cardiac: RRR, no murmurs, rubs, or gallops. No clubbing, cyanosis, edema.  Radials/DP/PT 2+ and equal bilaterally.  Respiratory:  Respirations regular and unlabored, clear to auscultation bilaterally. GI: Soft, nontender, nondistended. MS: No deformity or atrophy. Skin: Warm and dry, no rash. Neuro: Strength and sensation are intact. Psych: Normal affect.  Accessory Clinical Findings    Recent Labs: 07/06/2022: ALT 9; BUN 11; Creatinine 0.66; Hemoglobin 14.0; Platelet Count 207; Potassium 3.8; Sodium 139   Recent Lipid Panel    Component Value Date/Time   CHOL 218 (H) 07/07/2021 1008   TRIG 156 (H) 07/07/2021 1008   HDL 63 07/07/2021 1008   CHOLHDL 3.5 07/07/2021 1008   CHOLHDL 2 10/15/2015 1106   VLDL 21.0 10/15/2015 1106   LDLCALC 128 (H) 07/07/2021 1008   LDLDIRECT 126.5 11/10/2012 0826         ECG personally reviewed by me today: NSR, HR 74, nonspecific ST and T wave abnormality.  No significant changes from 04/27/2022.      Assessment & Plan   CAD. S/p CABG x 4 2012. Last nuclear stress test March 2022 was a normal, low risk study. Patient denies chest pain or shortness of breath at rest. She has mild dyspnea with heavy exertion. This is not new for her and is unchanged. It appears to be related to deconditioning. She has not followed an exercise program for several years. She is able to handle all household activities and ADLs/personal hygiene. Continue Zetia and  aspirin.  PAD. S/p multiple lower extremity interventions. Lower extremity arterial US January 2023 showed unchanged disease bilateral lower extremities. Patient continues with increasing claudication in right lower extremity. When pain starts she has to rest and "shake my leg out before I can keep doing anything." She also endorses right lower extremity cramping  that wakes her at night and requires her to get up and rub her leg until it resolves. 2+ PT/DP pulses bilaterally. Skin warm and dry without edema. She is due for lower extremity US and will schedule that prior to leaving today.  Continue Zetia and aspirin.  Bilateral carotid stenosis. Carotid US September 2023 Left > Right stenosis. Repeat ultrasound in September 2024.  Hypertension. Today patient's BP 124/82. She denies dizziness or headaches. Continue amlodipine.  Hyperlipidemia. Lipid panel November 2022 LDL 128, not at goal. She is intolerant to atorvastatin. She reports she has not taken Zetia in over two weeks and prior to that she was taking it sporadically. Given non adherence will not check a lipid panel or make medication changes today. She is instructed to take Zetia every day. She states her PCP will check a lipid panel next month at her annual physical. She will request he send the results. If LDL is >70 will start Crestor 20 mg in addition to Zetia.      Disposition: Schedule lower extremity US and follow-up with Dr. Gwenlyn Found for increased claudication. Take Zetia daily. PCP to check lipids in January 2024 and send results. Will consider addition of Crestor depending on results. Return to Dr. Stanford Breed in one year.    [name]. Alicia Eifler, NP-C     07/28/2022, 9:20 AM [place] [address] Office [phone] Fax [phone]   I spent 10 minutes examining this patient, reviewing medications, and using patient centered shared decision making involving her cardiac care.  Prior to her visit I spent greater than 20 minutes reviewing her past medical history,  medications, and prior cardiac tests.

## 2022-07-28 ENCOUNTER — Ambulatory Visit: Payer: Medicare Other | Attending: Student | Admitting: Student

## 2022-07-28 ENCOUNTER — Encounter: Payer: Self-pay | Admitting: Student

## 2022-07-28 ENCOUNTER — Ambulatory Visit: Payer: Medicare Other | Admitting: Podiatry

## 2022-07-28 VITALS — BP 124/82 | HR 74 | Ht 63.0 in | Wt 131.0 lb

## 2022-07-28 DIAGNOSIS — I2510 Atherosclerotic heart disease of native coronary artery without angina pectoris: Secondary | ICD-10-CM

## 2022-07-28 DIAGNOSIS — I779 Disorder of arteries and arterioles, unspecified: Secondary | ICD-10-CM

## 2022-07-28 DIAGNOSIS — I739 Peripheral vascular disease, unspecified: Secondary | ICD-10-CM

## 2022-07-28 DIAGNOSIS — I10 Essential (primary) hypertension: Secondary | ICD-10-CM

## 2022-07-28 DIAGNOSIS — E785 Hyperlipidemia, unspecified: Secondary | ICD-10-CM

## 2022-07-28 NOTE — Patient Instructions (Signed)
Medication Instructions:  Zetia 10 mg ( Take 1 Tablet Daily) *If you need a refill on your cardiac medications before your next appointment, please call your pharmacy*   Lab Work: No Labs If you have labs (blood work) drawn today and your tests are completely normal, you will receive your results only by: [place] (if you have MyChart) OR A paper copy in the mail If you have any lab test that is abnormal or we need to change your treatment, we will call you to review the results.   Testing/Procedures: No Testing   Follow-Up: At [facility], you and your health needs are our priority.  As part of our continuing mission to provide you with exceptional heart care, we have created designated Provider Care Teams.  These Care Teams include your primary Cardiologist (physician) and Advanced Practice Providers (APPs -  Physician Assistants and Nurse Practitioners) who all work together to provide you with the care you need, when you need it.  We recommend signing up for the patient portal called "MyChart".  Sign up information is provided on this After Visit Summary.  MyChart is used to connect with patients for Virtual Visits (Telemedicine).  Patients are able to view lab/test results, encounter notes, upcoming appointments, etc.  Non-urgent messages can be sent to your provider as well.   To learn more about what you can do with MyChart, go to [email or web address].    Your next appointment:   2 month(s) PV slot  The format for your next appointment:   In Person  Provider:   Quay Burow, MD  Kirk Ruths, MD  1 year

## 2022-08-02 ENCOUNTER — Ambulatory Visit: Payer: Medicare Other | Admitting: Podiatry

## 2022-08-03 ENCOUNTER — Ambulatory Visit: Payer: Medicare Other | Admitting: Thoracic Surgery (Cardiothoracic Vascular Surgery)

## 2022-08-25 ENCOUNTER — Ambulatory Visit (HOSPITAL_COMMUNITY)
Admission: RE | Admit: 2022-08-25 | Discharge: 2022-08-25 | Disposition: A | Discharge status: Home / Self Care | Payer: Medicare Other | Source: Ambulatory Visit | Attending: Cardiovascular Disease | Admitting: Cardiovascular Disease

## 2022-08-25 DIAGNOSIS — I739 Peripheral vascular disease, unspecified: Secondary | ICD-10-CM

## 2022-08-25 DIAGNOSIS — Z9862 Peripheral vascular angioplasty status: Secondary | ICD-10-CM | POA: Insufficient documentation

## 2022-08-30 ENCOUNTER — Ambulatory Visit
Admission: RE | Admit: 2022-08-30 | Discharge: 2022-08-30 | Disposition: A | Discharge status: Home / Self Care | Payer: Medicare Other | Source: Ambulatory Visit | Attending: Family | Admitting: Family

## 2022-08-30 ENCOUNTER — Other Ambulatory Visit: Payer: Self-pay | Admitting: Family

## 2022-08-30 DIAGNOSIS — K5900 Constipation, unspecified: Secondary | ICD-10-CM

## 2022-08-31 ENCOUNTER — Ambulatory Visit: Payer: Medicare Other | Admitting: Thoracic Surgery (Cardiothoracic Vascular Surgery)

## 2022-08-31 ENCOUNTER — Encounter: Payer: Self-pay | Admitting: Thoracic Surgery (Cardiothoracic Vascular Surgery)

## 2022-08-31 VITALS — BP 145/71 | HR 86 | Resp 20 | Ht 63.0 in | Wt 131.0 lb

## 2022-08-31 DIAGNOSIS — C3491 Malignant neoplasm of unspecified part of right bronchus or lung: Secondary | ICD-10-CM

## 2022-08-31 NOTE — Progress Notes (Signed)
[name]       [place],[name] [id number]             [phone]     HPI: [name] returns for scheduled follow-up from her previous right lung surgery.  [name] is a 77 year old woman with a history of tobacco abuse (quit 2011), multifocal adenocarcinoma of the lung, CAD, CABG, arthritis, anemia, hypertension, hyperlipidemia, reflux, reflux sympathetic dystrophy, and PAD.  She was being followed for multiple lung nodules.  A couple of the nodules had an increase in size and density over time.  There was some mild activity on PET.  I did a right upper lobe wedge and right lower lobe superior segmentectomy on 12/11/2021.  Her postoperative course was unremarkable and she went home on day 4.  Later she did have some intercostal neuralgia pain treated with gabapentin.  She saw Dr. Julien Nordmann.  She did not have any actionable mutations so observation was planned.  She saw him in December and her CT was stable.  She still has an occasional twinge of pain on the right side.  She is no longer taking gabapentin.  She has not had any respiratory issues.  Appetite is good.  No significant weight loss.  Wants to resume water aerobics.  Past Medical History:  Diagnosis Date   Anemia    Anxiety    hx   Arthritis    "my whole body"   Atrial septal aneurysm    COLONIC POLYPS, HX OF    COPD (chronic obstructive pulmonary disease) (HCC)    no per pt on 03/21/2014 & 02/06/2018   Coronary artery disease    a. s/p CABG 2012.   DISC DISEASE, CERVICAL    Gastroesophageal reflux disease    Headache    "I have headaches all the time" (02/06/2018)   Heart murmur    as a child   History of rheumatic fever    Hyperlipidemia    Hypertension    Hypothyroidism    Insomnia    Osteoarthritis    OSTEOPENIA    PAD (peripheral artery disease) ([place])    a. PTA to R SFA 2015. b. PTA to L SFA 05/2016.   Plantar fasciitis of left foot 10/02/2019   Pre-diabetes    RSD (reflex  sympathetic dystrophy) 10/31/2011   Vitamin D deficiency      Current Outpatient Medications  Medication Sig Dispense Refill   albuterol (VENTOLIN HFA) 108 (90 Base) MCG/ACT inhaler      amLODipine (NORVASC) 10 MG tablet TAKE ONE TABLET BY MOUTH ONCE DAILY 90 tablet 1   aspirin EC 81 MG tablet Take 81 mg by mouth daily.     Calcium Carb-Cholecalciferol (CALCIUM + D3) 600-800 MG-UNIT TABS Take 1 tablet by mouth 2 (two) times daily.     cyclobenzaprine (FLEXERIL) 10 MG tablet Take 10 mg by mouth at bedtime as needed for muscle spasms.     diclofenac Sodium (VOLTAREN) 1 % GEL daily as needed.     ezetimibe (ZETIA) 10 MG tablet Take 10 mg by mouth daily.     fluticasone (FLONASE) 50 MCG/ACT nasal spray USE 1 TO 2 SPRAY(S) IN EACH NOSTRIL ONCE DAILY AS NEEDED FOR 30 DAYS     Lancets (ONETOUCH DELICA PLUS [id number]) MISC 1 each by Other route 2 (two) times daily.     levothyroxine (SYNTHROID) 100 MCG tablet Take 100 mcg by mouth daily before breakfast.     meloxicam (MOBIC) 7.5  MG tablet Take 7.5 mg by mouth daily as needed for pain.     Multiple Vitamin (MULTIVITAMIN WITH MINERALS) TABS tablet Take 1 tablet by mouth every morning. Centrum     ondansetron (ZOFRAN) 4 MG tablet Take 4 mg by mouth every 8 (eight) hours as needed.     ONETOUCH VERIO test strip 1 each by Other route 2 (two) times daily.     Propylene Glycol 0.6 % SOLN Place 1 drop into both eyes every 4 (four) hours as needed (dry eyes).     traMADol (ULTRAM) 50 MG tablet Take 1 tablet (50 mg total) by mouth every 6 (six) hours as needed. 25 tablet 0   Vitamin D, Ergocalciferol, (DRISDOL) 1.25 MG (50000 UNIT) CAPS capsule Take 50,000 Units by mouth once a week.     gabapentin (NEURONTIN) 100 MG capsule Take 2 capsules (200 mg total) by mouth 3 (three) times daily. For control of nerve pain at site of surgery. (Patient not taking: Reported on 08/31/2022) 60 capsule 1   pantoprazole (PROTONIX) 40 MG tablet Take 1 tablet (40 mg total) by  mouth daily before breakfast. (Patient not taking: Reported on 08/31/2022) 60 tablet 1   No current facility-administered medications for this visit.   Facility-Administered Medications Ordered in Other Visits  Medication Dose Route Frequency Provider Last Rate Last Admin   tranexamic acid (CYKLOKAPRON) 2,000 mg in sodium chloride 0.9 % 50 mL Topical Application  7,322 mg Topical Once [place], [employer], PA-C        Physical Exam BP (!) 145/71 (BP Location: Left Arm, Patient Position: Sitting, Cuff Size: Normal)   Pulse 86   Resp 20   Ht 5\' 3"  (1.6 m)   Wt 131 lb (59.4 kg)   SpO2 93% Comment: RA  BMI 23.32 kg/m  77 year old woman in no acute distress Alert and oriented x 3 with no focal deficits No cervical or supraclavicular adenopathy Lungs clear with equal breath sounds bilaterally Cardiac regular rate and rhythm  Diagnostic Tests: CT CHEST WITH CONTRAST   TECHNIQUE: Multidetector CT imaging of the chest was performed during intravenous contrast administration.   RADIATION DOSE REDUCTION: This exam was performed according to the departmental dose-optimization program which includes automated exposure control, adjustment of the mA and/or kV according to patient size and/or use of iterative reconstruction technique.   CONTRAST:  74mL OMNIPAQUE IOHEXOL 300 MG/ML  SOLN   COMPARISON:  11/18/2021 PET.   FINDINGS: Cardiovascular: Aortic atherosclerosis. Ulcerative plaque in the transverse and descending thoracic aorta. Mild cardiomegaly, without pericardial effusion. Median sternotomy for CABG. No central pulmonary embolism, on this non-dedicated study.   Mediastinum/Nodes: Thyroidectomy. No mediastinal or hilar adenopathy. Esophageal fluid level on 59/2   Lungs/Pleura: No pleural fluid.  Advanced bullous emphysema.   Right lower lobe segmentectomy.   Biapical pleuroparenchymal scarring.   Scattered right-sided pulmonary nodules are felt to be similar, given altered  anatomy and positioning. The largest in the right upper lobe at 4 mm on 57/5 and right lower lobe at 5 mm on 95/5.   The nodule positioned within the left upper lobe, just caudal to pleuroparenchymal scarring, is similar at 8 mm on 30/5.   Upper Abdomen: Hepatic cysts. Central hypoattenuation on 156/2 is favored to represent the cephalad most aspect of the gallbladder when correlated with prior PET. Normal imaged portions of the spleen, stomach, pancreas, kidneys. Mild bilateral adrenal thickening and nodularity are unchanged and were not FDG avid on prior PET.   Musculoskeletal: Convex right  thoracic spine curvature.   IMPRESSION: 1. Status post right lower lobe segmentectomy, without recurrent or metastatic disease. 2. The left apical pulmonary nodule is similar at 8 mm and warrants attention on follow-up. 3. No thoracic adenopathy. 4. Aortic atherosclerosis (ICD10-I70.0) and emphysema (ICD10-J43.9). 5. Esophageal air fluid level suggests dysmotility or gastroesophageal reflux.     Electronically Signed   By: Abigail Miyamoto M.D.   On: 07/07/2022 10:30 I personally reviewed the CT images.  Postoperative changes present.  Other groundglass opacities/nodules unchanged.  Impression: [name] is a 77 year old woman with a history of tobacco abuse (quit 2011), multifocal adenocarcinoma of the lung, CAD, CABG, arthritis, anemia, hypertension, hyperlipidemia, reflux, reflux sympathetic dystrophy, and PAD.  Multifocal adenocarcinoma of the lung -status post right upper lobe wedge resection and right lower lobe superior segmentectomy.  No evidence of recurrent or progressive disease on her CT.  Dr. Julien Nordmann to schedule her to return with a repeat CT in 6 months.  Postoperative intercostal neuralgia-improved.  No longer requiring gabapentin.  She will call if she has worsening symptoms.   Plan: Follow-up with Dr. Julien Nordmann I will be happy to see her back anytime in the future if I  can be of any further assistance with her care  Melrose Nakayama, MD [facility] [phone]

## 2022-09-03 ENCOUNTER — Encounter: Payer: Self-pay | Admitting: Cardiovascular Disease

## 2022-09-03 ENCOUNTER — Encounter: Payer: Self-pay | Admitting: Podiatry

## 2022-09-03 ENCOUNTER — Ambulatory Visit: Payer: Medicare Other | Attending: Cardiovascular Disease | Admitting: Cardiovascular Disease

## 2022-09-03 ENCOUNTER — Ambulatory Visit (INDEPENDENT_AMBULATORY_CARE_PROVIDER_SITE_OTHER): Payer: Medicare Other | Admitting: Podiatry

## 2022-09-03 VITALS — BP 124/68 | HR 89 | Ht 63.0 in | Wt 131.8 lb

## 2022-09-03 DIAGNOSIS — B351 Tinea unguium: Secondary | ICD-10-CM

## 2022-09-03 DIAGNOSIS — M79676 Pain in unspecified toe(s): Secondary | ICD-10-CM

## 2022-09-03 DIAGNOSIS — M2011 Hallux valgus (acquired), right foot: Secondary | ICD-10-CM

## 2022-09-03 DIAGNOSIS — I739 Peripheral vascular disease, unspecified: Secondary | ICD-10-CM | POA: Diagnosis not present

## 2022-09-03 DIAGNOSIS — E1151 Type 2 diabetes mellitus with diabetic peripheral angiopathy without gangrene: Secondary | ICD-10-CM

## 2022-09-03 DIAGNOSIS — M2012 Hallux valgus (acquired), left foot: Secondary | ICD-10-CM

## 2022-09-03 DIAGNOSIS — I6522 Occlusion and stenosis of left carotid artery: Secondary | ICD-10-CM | POA: Diagnosis not present

## 2022-09-03 DIAGNOSIS — I779 Disorder of arteries and arterioles, unspecified: Secondary | ICD-10-CM | POA: Insufficient documentation

## 2022-09-03 NOTE — Progress Notes (Signed)
This patient returns to my office for at risk foot care.  This patient requires this care by a professional since this patient will be at risk due to having type 2 diabetes, PAD and PVD.  This patient is unable to cut nails herself since the patient cannot reach her  nails.These nails are painful walking and wearing shoes.  This patient presents for at risk foot care today.  General Appearance  Alert, conversant and in no acute stress.  Vascular  Dorsalis pedis and posterior tibial  pulses are  weakly palpable  bilaterally.  Capillary return is within normal limits  bilaterally. Temperature is within normal limits  bilaterally.  Neurologic  Senn-Weinstein monofilament wire test diminished  bilaterally. Muscle power within normal limits bilaterally.  Nails Thick disfigured discolored nails with subungual debris  from hallux to fifth toes bilaterally. No evidence of bacterial infection or drainage bilaterally.  Orthopedic  No limitations of motion  feet .  No crepitus or effusions noted.  HAV  B/L.  Skin  normotropic skin with no porokeratosis noted bilaterally.  No signs of infections or ulcers noted.     Onychomycosis  Pain in right toes  Pain in left toes  Consent was obtained for treatment procedures.   Mechanical debridement of nails 1-5  bilaterally performed with a nail nipper.  Filed with dremel without incident. Prescribe diabetic shoes.   Return office visit    3 months                  Told patient to return for periodic foot care and evaluation due to potential at risk complications.   Helane Gunther DPM

## 2022-09-03 NOTE — Patient Instructions (Signed)
Medication Instructions:  No Changes In Medications at this time.  *If you need a refill on your cardiac medications before your next appointment, please call your pharmacy*  Lab Work: None Ordered At This Time.  If you have labs (blood work) drawn today and your tests are completely normal, you will receive your results only by: MyChart Message (if you have MyChart) OR A paper copy in the mail If you have any lab test that is abnormal or we need to change your treatment, we will call you to review the results.  Testing/Procedures: Your physician has requested that you have a lower extremity arterial duplex IN ONE YEAR. During this test, ultrasound is used to evaluate arterial blood flow in the legs. Allow one hour for this exam. There are no restrictions or special instructions. This will take place at [address] [facility], [address].  Your physician has requested that you have an ankle brachial index (ABI) IN ONE YEAR. During this test an ultrasound and blood pressure cuff are used to evaluate the arteries that supply the arms and legs with blood. Allow thirty minutes for this exam. There are no restrictions or special instructions. This will take place at [address] [facility], [address].   Follow-Up: At [facility], you and your health needs are our priority.  As part of our continuing mission to provide you with exceptional heart care, we have created designated Provider Care Teams.  These Care Teams include your primary Cardiologist (physician) and Advanced Practice Providers (APPs -  Physician Assistants and Nurse Practitioners) who all work together to provide you with the care you need, when you need it.  Your next appointment:   1 year(s)  Provider:   Nanetta Batty, MD

## 2022-09-03 NOTE — Assessment & Plan Note (Signed)
Carotid Dopplers performed 05/12/2022 showed moderate left ICA stenosis.  This will be repeated this coming September.

## 2022-09-03 NOTE — Assessment & Plan Note (Signed)
History of peripheral arterial disease status post multiple peripheral vascular interventions beginning back in 2015 of her left SFA and in 2017 of her left SFA again as well as in 2019 of her right common femoral and total right SFA.  She has one-vessel runoff bilaterally.  She currently denies claudication but does have restless leg type symptoms.  Her right common femoral and right SFA appeared widely patent although she does have progression of disease on the left side with decrease in her left ABI.  At this point, I do not feel she needs further intervention.

## 2022-09-03 NOTE — Progress Notes (Signed)
09/03/2022 [name]   April 18, 1946  [id number]  Primary Physician Raymon Mutton., FNP Primary Cardiologist: Runell Gess MD Milagros Loll, Ali Molina, [place]  HPI:  [name] is a 77 y.o.  African American female patient of Dr. Ludwig Clarks referred for peripheral vascular evaluation. She was seen by Dr. Darrelyn Hillock  orthopedic surgeon, who referred her here. I last saw her in the office   04/27/2022. Her past history is remarkable for ischemic heart disease status post coronary bypass grafting in March 2012 of the LIMA to LAD, vein to the diagonal branch, obtuse marginal branch and a vein to acute marginal branch. She has normal LV function by 2-D echo. Further problems include hypertension, and hyperlipidemia. She complains of right lower extremity claudication and had seen Dr. Kirke Corin in the past who thought that her pain was arthritic in nature. Lower extremity arterial Doppler studies performed 07/07/14 revealed a right ABI 0.43 with an occluded distal right SFA and popliteal artery, left ABI 0.73 with a high-frequency signal in the mid left SFA. I performed angiography on her 753/15 revealing an occluded left SFA with one vessel runoff via the peroneal. I performed [employer] directional atherectomy followed by PTA using drug-eluting balloon with excellent angiographic result. Her followup Doppler revealed an increase in her right ABI from 0.4 to .7 with resolution of her claudication symptoms. Since I saw her over 2 years ago she developed claudication in her left leg now. I suspected that her previously  documented lesions in her left external leg artery and SFA have progressed. She underwent angiography by myself 06/07/16 revealing 80% segmental mid left SFA stenosis. I performed [facility] 1 directional atherectomy followed by drug-eluting balloon into plasty. She will vessel runoff via peroneal. She did have 50% distal left common iliac artery stenosis without a gradient. Her follow-up lower  extremity Doppler studies performed 06/24/16 were markedly improved ABI 1.04.   Because of right lower extremity claudication and markedly reduced right ABI of 0.46 she underwent angiography by myself on 02/06/2018 really 90% distal right common femoral artery stenosis, total occlusion in the distal right SFA with one-vessel runoff via peroneal.  I performed directional atherectomy followed by drug-eluting balloon angioplasty of the distal SFA occlusion and chocolate balloon angioplasty of the distal right common femoral artery.  She had follow-up Doppler studies performed in our office 08/25/2017 revealed marked improvement with a right ABI of 0.82.  Her claudication has  markedly improved.   Since I saw her in the office 3 months ago she continues to do well.  She does have symptoms of restless leg syndrome and some mild claudication.  Recent Dopplers performed 08/30/2022 revealed a right ABI of 0.84 and a left of 0.68.  Her right common femoral and SFA appeared widely patent although she does have some progression of disease on the left.  She continues to deny the same claudication symptoms for which she was intervened on initially but does have some restless leg syndrome type symptoms.  She denies chest pain or shortness of breath.   Current Meds  Medication Sig   albuterol (VENTOLIN HFA) 108 (90 Base) MCG/ACT inhaler    amLODipine (NORVASC) 10 MG tablet TAKE ONE TABLET BY MOUTH ONCE DAILY   aspirin EC 81 MG tablet Take 81 mg by mouth daily.   Calcium Carb-Cholecalciferol (CALCIUM + D3) 600-800 MG-UNIT TABS Take 1 tablet by mouth 2 (two) times daily.   cyclobenzaprine (FLEXERIL) 10 MG tablet Take 10 mg by  mouth at bedtime as needed for muscle spasms.   diclofenac Sodium (VOLTAREN) 1 % GEL daily as needed.   ezetimibe (ZETIA) 10 MG tablet Take 10 mg by mouth daily.   fluticasone (FLONASE) 50 MCG/ACT nasal spray USE 1 TO 2 SPRAY(S) IN EACH NOSTRIL ONCE DAILY AS NEEDED FOR 30 DAYS   gabapentin  (NEURONTIN) 100 MG capsule Take 2 capsules (200 mg total) by mouth 3 (three) times daily. For control of nerve pain at site of surgery.   Lancets (ONETOUCH DELICA PLUS LANCET33G) MISC 1 each by Other route 2 (two) times daily.   levothyroxine (SYNTHROID) 100 MCG tablet Take 100 mcg by mouth daily before breakfast.   meloxicam (MOBIC) 7.5 MG tablet Take 7.5 mg by mouth daily as needed for pain.   Multiple Vitamin (MULTIVITAMIN WITH MINERALS) TABS tablet Take 1 tablet by mouth every morning. Centrum   ondansetron (ZOFRAN) 4 MG tablet Take 4 mg by mouth every 8 (eight) hours as needed.   ONETOUCH VERIO test strip 1 each by Other route 2 (two) times daily.   pantoprazole (PROTONIX) 40 MG tablet Take 1 tablet (40 mg total) by mouth daily before breakfast.   Propylene Glycol 0.6 % SOLN Place 1 drop into both eyes every 4 (four) hours as needed (dry eyes).   traMADol (ULTRAM) 50 MG tablet Take 1 tablet (50 mg total) by mouth every 6 (six) hours as needed.   Vitamin D, Ergocalciferol, (DRISDOL) 1.25 MG (50000 UNIT) CAPS capsule Take 50,000 Units by mouth once a week.     Allergies  Allergen Reactions   Lipitor [Atorvastatin] Other (See Comments)    Muscle aches    Social History   Socioeconomic History   Marital status: Divorced    Spouse name: Not on file   Number of children: 2   Years of education: 14   Highest education level: Not on file  Occupational History   Occupation: Retired    Associate Professor: RETIRED  Tobacco Use   Smoking status: Former    Packs/day: 0.80    Years: 45.00    Total pack years: 36.00    Types: Cigarettes    Quit date: 08/23/2009    Years since quitting: 13.0   Smokeless tobacco: Never  Vaping Use   Vaping Use: Never used  Substance and Sexual Activity   Alcohol use: Yes    Alcohol/week: 2.0 standard drinks of alcohol    Types: 2 Glasses of wine per week   Drug use: Never   Sexual activity: Not Currently  Other Topics Concern   Not on file  Social History  Narrative   Not on file   Social Determinants of Health   Financial Resource Strain: Not on file  Food Insecurity: Not on file  Transportation Needs: Not on file  Physical Activity: Not on file  Stress: Not on file  Social Connections: Not on file  Intimate Partner Violence: Not on file     Review of Systems: General: negative for chills, fever, night sweats or weight changes.  Cardiovascular: negative for chest pain, dyspnea on exertion, edema, orthopnea, palpitations, paroxysmal nocturnal dyspnea or shortness of breath Dermatological: negative for rash Respiratory: negative for cough or wheezing Urologic: negative for hematuria Abdominal: negative for nausea, vomiting, diarrhea, bright red blood per rectum, melena, or hematemesis Neurologic: negative for visual changes, syncope, or dizziness All other systems reviewed and are otherwise negative except as noted above.    Blood pressure 124/68, pulse 89, height 5\' 3"  (1.6 m),  weight 131 lb 12.8 oz (59.8 kg), SpO2 96 %.  General appearance: alert and no distress Neck: no adenopathy, no carotid bruit, no JVD, supple, symmetrical, trachea midline, and thyroid not enlarged, symmetric, no tenderness/mass/nodules Lungs: clear to auscultation bilaterally Heart: regular rate and rhythm, S1, S2 normal, no murmur, click, rub or gallop Extremities: extremities normal, atraumatic, no cyanosis or edema Pulses: Diminished pedal pulses Skin: Skin color, texture, turgor normal. No rashes or lesions Neurologic: Grossly normal  EKG not performed today  ASSESSMENT AND PLAN:   Claudication in peripheral vascular disease (HCC) History of peripheral arterial disease status post multiple peripheral vascular interventions beginning back in 2015 of her left SFA and in 2017 of her left SFA again as well as in 2019 of her right common femoral and total right SFA.  She has one-vessel runoff bilaterally.  She currently denies claudication but does have  restless leg type symptoms.  Her right common femoral and right SFA appeared widely patent although she does have progression of disease on the left side with decrease in her left ABI.  At this point, I do not feel she needs further intervention.  Carotid artery disease (HCC) Carotid Dopplers performed 05/12/2022 showed moderate left ICA stenosis.  This will be repeated this coming September.     Runell Gess MD FACP,FACC,FAHA, [facility] 09/03/2022 9:51 AM

## 2022-09-09 NOTE — Progress Notes (Signed)
Patient presents to the office today for diabetic shoe and insole measuring.  Patient was measured with brannock device to determine size and width for 1 pair of extra depth shoes and foam casted for 3 pair of insoles.   ABN signed.   Documentation of medical necessity will be sent to patient's treating diabetic doctor to verify and sign.   Patient's diabetic provider: Dr. Sunday Corn Fatima Sanger, FNP)   Shoes and insoles will be ordered at that time and patient will be notified for an appointment for fitting when they arrive.   Brannock measurement: RIGHT/LEFT: 7.5 C  Patient shoe selection-   1st   Shoe choice:   Orthofeet 849  Shoe size ordered: Women's 7.5 Wide

## 2022-09-10 ENCOUNTER — Ambulatory Visit: Payer: Medicare Other | Admitting: *Deleted

## 2022-09-10 DIAGNOSIS — M2011 Hallux valgus (acquired), right foot: Secondary | ICD-10-CM

## 2022-09-10 DIAGNOSIS — E1151 Type 2 diabetes mellitus with diabetic peripheral angiopathy without gangrene: Secondary | ICD-10-CM

## 2022-09-17 ENCOUNTER — Telehealth: Payer: Self-pay | Admitting: Cardiovascular Disease

## 2022-09-17 NOTE — Telephone Encounter (Signed)
LMTCB.  Spoke with staff at [facility] to fax current labs to Dr. Gwenlyn Found at [phone]. Should receive by end of day today.

## 2022-09-17 NOTE — Telephone Encounter (Addendum)
Spoke with pt regarding lab work sent from [employer] office. Let pt know that labs were received. Upon chart review it was noted that Dr. Gwenlyn Found is a consulting provider for PAD. Dr. Stanford Breed is her primary cardiologist. Will provide labs to Dr. Stanford Breed to advise. Explained this to pt and she verbalizes understanding.

## 2022-09-17 NOTE — Telephone Encounter (Signed)
Spoke with pt to inform her that her PCP is faxing blood work to our clinic,

## 2022-09-17 NOTE — Telephone Encounter (Signed)
Pt states that [facility], Dr [employer] office, was suppose to be sending results for labs to Dr. Kennon Holter office and she would like to make sure they were received.

## 2022-09-17 NOTE — Telephone Encounter (Signed)
Blood work results given to Dr. Kennon Holter nurse.

## 2022-09-23 NOTE — Telephone Encounter (Signed)
Spoke with pt, dr Stanford Breed has reviewed her l;abs work and her LDL of 98 is higher than the goal for that medication. She is already taking zetia. Appointment made for patient to see the pharmacist here in the office to discuss injectable meds.

## 2022-10-07 ENCOUNTER — Emergency Department (HOSPITAL_BASED_OUTPATIENT_CLINIC_OR_DEPARTMENT_OTHER): Payer: Medicare Other

## 2022-10-07 ENCOUNTER — Emergency Department (HOSPITAL_BASED_OUTPATIENT_CLINIC_OR_DEPARTMENT_OTHER)
Admission: EM | Admit: 2022-10-07 | Discharge: 2022-10-07 | Disposition: A | Discharge status: Home / Self Care | Payer: Medicare Other | Attending: Emergency Medicine | Admitting: Emergency Medicine

## 2022-10-07 ENCOUNTER — Encounter (HOSPITAL_BASED_OUTPATIENT_CLINIC_OR_DEPARTMENT_OTHER): Payer: Self-pay | Admitting: Emergency Medicine

## 2022-10-07 ENCOUNTER — Other Ambulatory Visit: Payer: Self-pay

## 2022-10-07 DIAGNOSIS — Z79899 Other long term (current) drug therapy: Secondary | ICD-10-CM | POA: Diagnosis not present

## 2022-10-07 DIAGNOSIS — Z7982 Long term (current) use of aspirin: Secondary | ICD-10-CM | POA: Diagnosis not present

## 2022-10-07 DIAGNOSIS — K5900 Constipation, unspecified: Secondary | ICD-10-CM | POA: Diagnosis not present

## 2022-10-07 DIAGNOSIS — R110 Nausea: Secondary | ICD-10-CM | POA: Insufficient documentation

## 2022-10-07 DIAGNOSIS — R109 Unspecified abdominal pain: Secondary | ICD-10-CM | POA: Diagnosis present

## 2022-10-07 LAB — URINALYSIS, ROUTINE W REFLEX MICROSCOPIC
Bilirubin Urine: NEGATIVE
Glucose, UA: NEGATIVE mg/dL
Hgb urine dipstick: NEGATIVE
Ketones, ur: NEGATIVE mg/dL
Leukocytes,Ua: NEGATIVE
Nitrite: NEGATIVE
Protein, ur: 30 mg/dL — AB
Specific Gravity, Urine: 1.011 (ref 1.005–1.030)
pH: 7 (ref 5.0–8.0)

## 2022-10-07 LAB — BASIC METABOLIC PANEL
Anion gap: 12 (ref 5–15)
BUN: 11 mg/dL (ref 8–23)
CO2: 19 mmol/L — ABNORMAL LOW (ref 22–32)
Calcium: 8.9 mg/dL (ref 8.9–10.3)
Chloride: 108 mmol/L (ref 98–111)
Creatinine, Ser: 0.61 mg/dL (ref 0.44–1.00)
GFR, Estimated: >60 mL/min (ref >60)
Glucose, Bld: 115 mg/dL — ABNORMAL HIGH (ref 70–99)
Potassium: 4 mmol/L (ref 3.5–5.1)
Sodium: 139 mmol/L (ref 135–145)

## 2022-10-07 LAB — CBC
HCT: 40.4 % (ref 36.0–46.0)
Hemoglobin: 13.8 g/dL (ref 12.0–15.0)
MCH: 29.7 pg (ref 26.0–34.0)
MCHC: 34.2 g/dL (ref 30.0–36.0)
MCV: 86.9 fL (ref 80.0–100.0)
Platelets: 178 10*3/uL (ref 150–400)
RBC: 4.65 MIL/uL (ref 3.87–5.11)
RDW: 13.6 % (ref 11.5–15.5)
WBC: 4.6 10*3/uL (ref 4.0–10.5)
nRBC: 0 % (ref 0.0–0.2)

## 2022-10-07 MED ORDER — SENNOSIDES-DOCUSATE SODIUM 8.6-50 MG PO TABS: 1.0000 | ORAL_TABLET | Freq: Every day | ORAL | 0 refills | Status: DC

## 2022-10-07 MED ORDER — POLYETHYLENE GLYCOL 3350 17 G PO PACK: 17.0000 g | PACK | Freq: Every day | ORAL | 0 refills | Status: AC

## 2022-10-07 MED ORDER — IOHEXOL 300 MG/ML  SOLN
100.0000 mL | Freq: Once | INTRAMUSCULAR | Status: AC | PRN
  Administered 2022-10-07: 70 mL via INTRAVENOUS

## 2022-10-07 NOTE — ED Notes (Signed)
Dc instructions reviewed with patient. Patient voiced understanding. Dc with belongings.  °

## 2022-10-07 NOTE — ED Triage Notes (Signed)
Pt arrives to ED with c/o lower abdominal pain x2 months.

## 2022-10-07 NOTE — Discharge Instructions (Addendum)
You have been seen and discharged from the emergency department.  Your blood work was normal.  Your CT scan showed no findings for acute abdominal pain.  It is my recommendation that you use MiraLAX in 6 to 8 ounces of water starting about 3 times a day.  He may downtrend this once to achieve desired comfort.  You have also been sent a different stool softener.  Stay well-hydrated.  Follow-up with your primary provider for further evaluation and further care. There was mention of stable lung nodules that need to be monitored on a yearly basis. Take home medications as prescribed. If you have any worsening symptoms or further concerns for your health please return to an emergency department for further evaluation.

## 2022-10-07 NOTE — ED Notes (Signed)
Patient transported to CT 

## 2022-10-07 NOTE — ED Provider Notes (Signed)
[facility] Provider Note   CSN: [id number] Arrival date & time: 10/07/22  9924     History  Chief Complaint  Patient presents with   Abdominal Pain    [name] is a 77 y.o. female.  HPI   77 year old female presents emergency department with diffuse abdominal pain.  Patient admits that she has been struggling with constipation.  She tried multiple different things for constipation, she admits that she sometimes goes once every 2 weeks and it has been about 2 weeks since her last bowel movement.  She is complaining now of worsening abdominal pain.  Denies any genitourinary symptoms.  No fever or chills.  She states that when the pain is severe she feels very nauseous.  No vomiting.  Home Medications Prior to Admission medications   Medication Sig Start Date End Date Taking? Authorizing Provider  albuterol (VENTOLIN HFA) 108 (90 Base) MCG/ACT inhaler  12/15/18   [provider]  amLODipine (NORVASC) 10 MG tablet TAKE ONE TABLET BY MOUTH ONCE DAILY 08/19/16   Biagio Borg, MD  aspirin EC 81 MG tablet Take 81 mg by mouth daily.    [provider]  Calcium Carb-Cholecalciferol (CALCIUM + D3) 600-800 MG-UNIT TABS Take 1 tablet by mouth 2 (two) times daily. 08/04/09   [provider]  cyclobenzaprine (FLEXERIL) 10 MG tablet Take 10 mg by mouth at bedtime as needed for muscle spasms. 12/25/20   [provider]  diclofenac Sodium (VOLTAREN) 1 % GEL daily as needed. 11/16/19   [provider]  ezetimibe (ZETIA) 10 MG tablet Take 10 mg by mouth daily. 06/18/20   [provider]  fluticasone (FLONASE) 50 MCG/ACT nasal spray USE 1 TO 2 SPRAY(S) IN EACH NOSTRIL ONCE DAILY AS NEEDED FOR 30 DAYS 12/02/20   [provider]  gabapentin (NEURONTIN) 100 MG capsule Take 2 capsules (200 mg total) by mouth 3 (three) times daily. For control of nerve pain at site of surgery. 03/15/22   Melrose Nakayama, MD  Lancets [facility] DELICA PLUS [id number]) [place] 1 each by Other route 2 (two) times daily. 06/29/18   [provider]  levothyroxine (SYNTHROID) 100 MCG tablet Take 100 mcg by mouth daily before breakfast.    [provider]  lubiprostone (AMITIZA) 24 MCG capsule Take 24 mcg by mouth daily. 08/27/22   [provider]  meloxicam (MOBIC) 7.5 MG tablet Take 7.5 mg by mouth daily as needed for pain. 12/02/20   [provider]  Multiple Vitamin (MULTIVITAMIN WITH MINERALS) TABS tablet Take 1 tablet by mouth every morning. Centrum    [provider]  ondansetron (ZOFRAN) 4 MG tablet Take 4 mg by mouth every 8 (eight) hours as needed. 04/05/22   [provider]  [facility] VERIO test strip 1 each by Other route 2 (two) times daily. 06/28/18   [provider]  pantoprazole (PROTONIX) 40 MG tablet Take 1 tablet (40 mg total) by mouth daily before breakfast. 10/21/21   Eulogio Bear, NP  Propylene Glycol 0.6 % SOLN Place 1 drop into both eyes every 4 (four) hours as needed (dry eyes).    [provider]  traMADol (ULTRAM) 50 MG tablet Take 1 tablet (50 mg total) by mouth every 6 (six) hours as needed. 12/24/21   Melrose Nakayama, MD  Vitamin D, Ergocalciferol, (DRISDOL) 1.25 MG (50000 UNIT) CAPS capsule Take 50,000 Units by mouth once a week. 03/14/20   [provider]      Allergies    Lipitor [atorvastatin]    Review of Systems   Review of Systems  Constitutional:  Positive for appetite change and fatigue. Negative for fever.  Respiratory:  Negative for shortness of breath.   Cardiovascular:  Negative for chest pain.  Gastrointestinal:  Positive for abdominal pain, constipation and nausea. Negative for blood in stool, diarrhea and vomiting.  Genitourinary:  Negative for flank pain.  Musculoskeletal:  Negative for back pain.  Skin:  Negative for rash.  Neurological:  Negative for headaches.    Physical  Exam Updated Vital Signs BP (!) 155/88   Pulse 72   Temp 98.2 F (36.8 C) (Oral)   Resp 18   Ht 5\' 3"  (1.6 m)   Wt 59 kg   SpO2 100%   BMI 23.03 kg/m  Physical Exam Vitals and nursing note reviewed.  Constitutional:      General: She is not in acute distress.    Appearance: Normal appearance.  HENT:     Head: Normocephalic.     Mouth/Throat:     Mouth: Mucous membranes are moist.  Cardiovascular:     Rate and Rhythm: Normal rate.  Pulmonary:     Effort: Pulmonary effort is normal. No respiratory distress.  Abdominal:     General: Bowel sounds are decreased. There is no distension.     Palpations: Abdomen is soft.     Tenderness: There is generalized abdominal tenderness. There is no guarding or rebound.     Hernia: No hernia is present.  Skin:    General: Skin is warm.  Neurological:     Mental Status: She is alert and oriented to person, place, and time. Mental status is at baseline.  Psychiatric:        Mood and Affect: Mood normal.     ED Results / Procedures / Treatments   Labs (all labs ordered are listed, but only abnormal results are displayed) Labs Reviewed  BASIC METABOLIC PANEL - Abnormal; Notable for the following components:      Result Value   CO2 19 (*)    Glucose, Bld 115 (*)    All other components within normal limits  URINALYSIS, ROUTINE W REFLEX MICROSCOPIC - Abnormal; Notable for the following components:   Protein, ur 30 (*)    Bacteria, UA RARE (*)    All other components within normal limits  CBC    EKG None  Radiology No results found.  Procedures Procedures    Medications Ordered in ED Medications - No data to display  ED Course/ Medical Decision Making/ A&P                             Medical Decision Making Amount and/or Complexity of Data Reviewed Labs: ordered. Radiology: ordered.  Risk OTC drugs. Prescription drug management.   77 year old female presents to the emergency department with diffuse, mild  abdominal pain.  She is also struggling with constipation.  Has been trying intermittent over-the-counter regimens without significant relief.  Patient is tender on exam, no distention, no guarding.  Vitals are stable.  Due to the degree of tenderness imaging was obtained.  Blood work is normal.  CT of the abdomen pelvis identifies lung nodules and other stable findings with no other acute finding of abdominal pain.  Patient otherwise feels well.  I have discussed the results and other regimens for constipation.  Patient will follow-up  as an outpatient with her primary doctor.  Patient at this time appears safe and stable for discharge and close outpatient follow up. Discharge plan and strict return to ED precautions discussed, patient verbalizes understanding and agreement.        Final Clinical Impression(s) / ED Diagnoses Final diagnoses:  None    Rx / DC Orders ED Discharge Orders     None         Lorelle Gibbs, DO 10/07/22 1450

## 2022-10-15 ENCOUNTER — Ambulatory Visit: Payer: Medicare Other

## 2022-10-20 ENCOUNTER — Encounter: Payer: Self-pay | Admitting: Gastroenterology

## 2022-10-20 ENCOUNTER — Ambulatory Visit: Payer: Medicare Other | Admitting: Gastroenterology

## 2022-10-20 VITALS — BP 130/70 | HR 72 | Ht 63.0 in | Wt 132.0 lb

## 2022-10-20 DIAGNOSIS — R1084 Generalized abdominal pain: Secondary | ICD-10-CM

## 2022-10-20 DIAGNOSIS — R110 Nausea: Secondary | ICD-10-CM | POA: Diagnosis not present

## 2022-10-20 DIAGNOSIS — Z860100 Personal history of colon polyps, unspecified: Secondary | ICD-10-CM | POA: Insufficient documentation

## 2022-10-20 DIAGNOSIS — K219 Gastro-esophageal reflux disease without esophagitis: Secondary | ICD-10-CM

## 2022-10-20 DIAGNOSIS — K5900 Constipation, unspecified: Secondary | ICD-10-CM | POA: Diagnosis not present

## 2022-10-20 MED ORDER — NA SULFATE-K SULFATE-MG SULF 17.5-3.13-1.6 GM/177ML PO SOLN: 1.0000 | Freq: Once | ORAL | 0 refills | Status: AC

## 2022-10-20 NOTE — Progress Notes (Signed)
10/20/2022 [name] [id number] 1946-06-24  Review of pertinent gastrointestinal problems:   1. routine risk for colon cancer, colonoscopy February 2009 , Dr. Ardis Hughs, left-sided diverticulosis without any polyps. Recall colonoscopy at 10 year interval.  Colonoscopy 01/2019 with two 2-10 mm polyps removed and were tubular adenomas with recall recommended in 3 years. 2. Mild gastritis, HH on EGD 01/2013 Ardis Hughs, done for thickened distal esophagus on CT. h pylori negative.   3.  Dysphasia, intermittent.  EGD December 2016 Dr. Ardis Hughs found 5 cm hiatal hernia with acid related esophagitis without strictures or stenosis.  Recommended twice daily proton pump inhibitor and that she chew her food well eat slowly and take small bites.   HISTORY OF PRESENT ILLNESS: This is a 77 year old female who is a patient of Dr. Ardis Hughs.  She is here today to discuss her abdominal pain and constipation.  She is questioning when she is due for another colonoscopy.  As above her last colonoscopy was in June 2020 with 2 adenomas removed that were between 2 and 10 mm in size.  Was recommended to have a repeat in 3 years.  It looks like we sent her a recall letter last year, asking her to come in for an appointment prior to the procedure due to her age.  She reports issues with constipation.  Has been using MiraLAX, but then after a while it tends to give her diarrhea/loose stools.  She does have some type of powder fiber at home, but uses it very sporadically.  She reports having generalized abdominal pain.  CT scan abdomen and pelvis with contrast on 10/07/2022 showed the following:  1. No acute findings in the abdomen or pelvis. 2. Colonic diverticulosis without evidence of acute diverticulitis. 3. Bilateral adrenal adenomas. No specific imaging follow-up is needed. 4. Diffuse vascular calcifications including along branch vessels. There are areas of stenosis along bilateral iliac vessels. Stable fusiform  dilatation of the right common iliac artery. Please correlate with any particular symptomatology. 5. There are 2 new right lower lobe lung nodules measuring up to 5 mm. No follow-up needed if patient is low-risk (and has no known or suspected primary neoplasm). Non-contrast chest CT can be considered in 12 months if patient is high-risk. This recommendation follows the consensus statement: Guidelines for Management of Incidental Pulmonary Nodules Detected on CT Images: From the Fleischner Society 2017; Radiology 2017; 284:228-243. 6. Advanced multilevel degenerative changes of the spine with associated areas of stenosis.  She does report nausea, which appears has been an intermittent issue over the years as well.  She was on pantoprazole 40 mg daily, but says that she has stopped it.  As of late she has been using OTC famotidine.  She does report frequent heartburn and reflux.  EGD as above in 2016 with a 5 cm hiatal hernia and acid related esophagitis was noted.  Past Medical History:  Diagnosis Date   Anemia    Anxiety    hx   Arthritis    "my whole body"   Atrial septal aneurysm    COLONIC POLYPS, HX OF    COPD (chronic obstructive pulmonary disease) (HCC)    no per pt on 03/21/2014 & 02/06/2018   Coronary artery disease    a. s/p CABG 2012.   DISC DISEASE, CERVICAL    Gastroesophageal reflux disease    Headache    "I have headaches all the time" (02/06/2018)   Heart murmur    as a child  History of rheumatic fever    Hyperlipidemia    Hypertension    Hypothyroidism    Insomnia    Osteoarthritis    OSTEOPENIA    PAD (peripheral artery disease) ([place])    a. PTA to R SFA 2015. b. PTA to L SFA 05/2016.   Plantar fasciitis of left foot 10/02/2019   Pre-diabetes    RSD (reflex sympathetic dystrophy) 10/31/2011   Vitamin D deficiency    Past Surgical History:  Procedure Laterality Date   ABDOMINAL HYSTERECTOMY  1977   "partial"   BALLOON ANGIOPLASTY, ARTERY Right  03/21/2014   SFA   CARDIAC CATHETERIZATION  2011    CARPAL TUNNEL RELEASE Right 2005   CATARACT EXTRACTION Left 2013   CORONARY ARTERY BYPASS GRAFT  10/2009   LIMA to the LAD, saphenous vein graft to the acute marginal, saphenous vein graft to the diagonal and obtuse marginal.   DILATION AND CURETTAGE OF UTERUS  1978   INTERCOSTAL NERVE BLOCK Right 12/11/2021   Procedure: INTERCOSTAL NERVE BLOCK;  Surgeon: Melrose Nakayama, MD;  Location: [place];  Service: Thoracic;  Laterality: Right;   JOINT REPLACEMENT     LOWER EXTREMITY ANGIOGRAM N/A 03/21/2014   Procedure: LOWER EXTREMITY ANGIOGRAM;  Surgeon: Lorretta Harp, MD;  Location: [facility] CATH LAB;  Service: Cardiovascular;  Laterality: N/A;   LOWER EXTREMITY ANGIOGRAM  06/07/2016   Abdominal aortogram/bilateral iliac angiogram/bifemoral runoff   LOWER EXTREMITY ANGIOGRAPHY N/A 02/06/2018   Procedure: LOWER EXTREMITY ANGIOGRAPHY;  Surgeon: Lorretta Harp, MD;  Location: [place] CV LAB;  Service: Cardiovascular;  Laterality: N/A;   LYMPH NODE DISSECTION  12/11/2021   Procedure: LYMPH NODE DISSECTION;  Surgeon: Melrose Nakayama, MD;  Location: [place];  Service: Thoracic;;   PERIPHERAL VASCULAR ATHERECTOMY  02/06/2018   Procedure: PERIPHERAL VASCULAR ATHERECTOMY;  Surgeon: Lorretta Harp, MD;  Location: [place] CV LAB;  Service: Cardiovascular;;   PERIPHERAL VASCULAR CATHETERIZATION Bilateral 06/07/2016   Procedure: Lower Extremity Angiography;  Surgeon: Lorretta Harp, MD;  Location: [place] CV LAB;  Service: Cardiovascular;  Laterality: Bilateral;   PERIPHERAL VASCULAR CATHETERIZATION Left 06/07/2016   Procedure: Peripheral Vascular Atherectomy;  Surgeon: Lorretta Harp, MD;  Location: [place] CV LAB;  Service: Cardiovascular;  Laterality: Left;  SFA   PERIPHERAL VASCULAR CATHETERIZATION Left 06/07/2016   Procedure: Peripheral Vascular Balloon Angioplasty;  Surgeon: Lorretta Harp, MD;  Location: [place] CV LAB;   Service: Cardiovascular;  Laterality: Left;  SFA   THYROIDECTOMY  1995   TONSILLECTOMY     TOTAL KNEE ARTHROPLASTY Right 06/18/2014   Procedure: RIGHT TOTAL KNEE ARTHROPLASTY;  Surgeon: Tobi Bastos, MD;  Location: [facility];  Service: Orthopedics;  Laterality: Right;   XI ROBOTIC ASSISTED THORACOSCOPY- SEGMENTECTOMY Right 12/11/2021   Procedure: XI ROBOTIC ASSISTED THORACOSCOPY-RIGHT LOWER LOBE SUPERIOR SEGMENT, RIGHT UPPER LOBE WEDGE;  Surgeon: Melrose Nakayama, MD;  Location: [place];  Service: Thoracic;  Laterality: Right;    reports that she quit smoking about 13 years ago. Her smoking use included cigarettes. She has a 36.00 pack-year smoking history. She has never used smokeless tobacco. She reports that she does not currently use alcohol after a past usage of about 2.0 standard drinks of alcohol per week. She reports that she does not use drugs. family history includes Arthritis in her maternal aunt; Diabetes in her maternal aunt; Heart disease in her maternal aunt; Hypertension in her maternal aunt. Allergies  Allergen Reactions   Lipitor [Atorvastatin] Other (  See Comments)    Muscle aches      Outpatient Encounter Medications as of 10/20/2022  Medication Sig   albuterol (VENTOLIN HFA) 108 (90 Base) MCG/ACT inhaler    amLODipine (NORVASC) 10 MG tablet TAKE ONE TABLET BY MOUTH ONCE DAILY   aspirin EC 81 MG tablet Take 81 mg by mouth daily.   Calcium Carb-Cholecalciferol (CALCIUM + D3) 600-800 MG-UNIT TABS Take 1 tablet by mouth 2 (two) times daily.   cyclobenzaprine (FLEXERIL) 10 MG tablet Take 10 mg by mouth at bedtime as needed for muscle spasms.   diclofenac Sodium (VOLTAREN) 1 % GEL daily as needed.   ezetimibe (ZETIA) 10 MG tablet Take 10 mg by mouth daily.   fluticasone (FLONASE) 50 MCG/ACT nasal spray USE 1 TO 2 SPRAY(S) IN EACH NOSTRIL ONCE DAILY AS NEEDED FOR 30 DAYS   gabapentin (NEURONTIN) 100 MG capsule Take 2 capsules (200 mg total) by mouth 3 (three) times daily.  For control of nerve pain at site of surgery.   Lancets (ONETOUCH DELICA PLUS 123XX123) MISC 1 each by Other route 2 (two) times daily.   levothyroxine (SYNTHROID) 100 MCG tablet Take 100 mcg by mouth daily before breakfast.   lubiprostone (AMITIZA) 24 MCG capsule Take 24 mcg by mouth daily.   meloxicam (MOBIC) 7.5 MG tablet Take 7.5 mg by mouth daily as needed for pain.   Multiple Vitamin (MULTIVITAMIN WITH MINERALS) TABS tablet Take 1 tablet by mouth every morning. Centrum   ondansetron (ZOFRAN) 4 MG tablet Take 4 mg by mouth every 8 (eight) hours as needed.   ONETOUCH VERIO test strip 1 each by Other route 2 (two) times daily.   pantoprazole (PROTONIX) 40 MG tablet Take 1 tablet (40 mg total) by mouth daily before breakfast.   polyethylene glycol (MIRALAX) 17 g packet Take 17 g by mouth daily.   Propylene Glycol 0.6 % SOLN Place 1 drop into both eyes every 4 (four) hours as needed (dry eyes).   senna-docusate (SENOKOT-S) 8.6-50 MG tablet Take 1 tablet by mouth daily.   traMADol (ULTRAM) 50 MG tablet Take 1 tablet (50 mg total) by mouth every 6 (six) hours as needed.   Vitamin D, Ergocalciferol, (DRISDOL) 1.25 MG (50000 UNIT) CAPS capsule Take 50,000 Units by mouth once a week.   Facility-Administered Encounter Medications as of 10/20/2022  Medication   tranexamic acid (CYKLOKAPRON) 2,000 mg in sodium chloride 0.9 % 50 mL Topical Application     REVIEW OF SYSTEMS  : All other systems reviewed and negative except where noted in the History of Present Illness.   PHYSICAL EXAM: BP 130/70   Pulse 72   Ht '5\' 3"'$  (1.6 m)   Wt 132 lb (59.9 kg)   BMI 23.38 kg/m  General: Well developed AA female in no acute distress Head: Normocephalic and atraumatic Eyes:  Sclerae anicteric, conjunctiva pink. Ears: Normal auditory acuity Lungs: Clear throughout to auscultation; no W/R/R. Heart: Regular rate and rhythm; no M/R/G. Abdomen: Soft, non-distended.  BS present.  Non-tender. Rectal:  Will be  done at the time of colonoscopy. Musculoskeletal: Symmetrical with no gross deformities  Skin: No lesions on visible extremities Extremities: No edema  Neurological: Alert oriented x 4, grossly non-focal Psychological:  Alert and cooperative. Normal mood and affect  ASSESSMENT AND PLAN: *Personal history of colon polyps, tubular adenomas: Last colonoscopy in 2020 with repeat recommended at 3-year interval.  Will schedule with Dr. Hilarie Fredrickson in Dr. Ardis Hughs absence. *Constipation: Will instruct on MiraLAX bowel purge to  be taken this evening and then I would like her to take MiraLAX daily, 1 capful mixed in 8 ounces of liquid and 1 dose of powder fiber supplement such as Benefiber daily starting with 2 teaspoons in 8 ounces of liquid to help with bulking. *Generalized abdominal pain: May be from constipation.  Recent CT scan showed unremarkable abdomen. *GERD and nausea: These are longstanding issues.  She was on pantoprazole 40 mg daily, but reports that she actually had stopped taking it.  Has been using famotidine OTC.  She will restart her pantoprazole 40 mg daily and can use Pepcid OTC in addition as well if needed, particularly in the evenings or at nighttime.  Will plan for EGD as well.  **The risks, benefits, and alternatives to EGD and colonoscopy were discussed with the patient and she consents to proceed.   CC:  [name], FNP

## 2022-10-20 NOTE — Patient Instructions (Addendum)
_______________________________________________________  If your blood pressure at your visit was 140/90 or greater, please contact your primary care physician to follow up on this.  _______________________________________________________  If you are age 79 or older, your body mass index should be between 23-30. Your Body mass index is 23.38 kg/m. If this is out of the aforementioned range listed, please consider follow up with your Primary Care Provider.  If you are age 90 or younger, your body mass index should be between 19-25. Your Body mass index is 23.38 kg/m. If this is out of the aformentioned range listed, please consider follow up with your Primary Care Provider.   ________________________________________________________  The [facility] providers would like to encourage you to use [facility] to communicate with providers for non-urgent requests or questions.  Due to long hold times on the telephone, sending your provider a message by [facility] may be a faster and more efficient way to get a response.  Please allow 48 business hours for a response.  Please remember that this is for non-urgent requests.  _______________________________________________________  [name] have been scheduled for an endoscopy and colonoscopy. Please follow the written instructions given to you at your visit today. Please pick up your prep supplies at the pharmacy within the next 1-3 days. If you use inhalers (even only as needed), please bring them with you on the day of your procedure.  [name] recommends that you complete a bowel purge (to clean out your bowels). Please do the following: Purchase a bottle of Miralax over the counter as well as a box of 5 mg dulcolax tablets. Take 4 dulcolax tablets. A high fiber diet with plenty of fluids (up to 8 glasses of water daily) is suggested to relieve these symptoms.  Metamucil, 1 tablespoon once or twice daily can be used to keep bowels regular if needed.  Wait 1  hour. You will then drink 6-8 capfuls of Miralax mixed in an adequate amount of water/juice/gatorade (you may choose which of these liquids to drink) over the next 2-3 hours. You should expect results within 1 to 6 hours after completing the bowel purge.  A high fiber diet with plenty of fluids (up to 8 glasses of water daily) is suggested to relieve these symptoms.  Fiber 1 tablespoon once or twice daily can be used to keep bowels regular if needed.

## 2022-10-21 NOTE — Progress Notes (Signed)
Addendum: Reviewed and agree with assessment and management plan. Jaison Petraglia M, MD  

## 2022-10-22 ENCOUNTER — Ambulatory Visit (INDEPENDENT_AMBULATORY_CARE_PROVIDER_SITE_OTHER): Payer: Medicare Other

## 2022-10-22 ENCOUNTER — Telehealth: Payer: Self-pay | Admitting: Gastroenterology

## 2022-10-22 DIAGNOSIS — M2011 Hallux valgus (acquired), right foot: Secondary | ICD-10-CM | POA: Diagnosis not present

## 2022-10-22 DIAGNOSIS — E1151 Type 2 diabetes mellitus with diabetic peripheral angiopathy without gangrene: Secondary | ICD-10-CM | POA: Diagnosis not present

## 2022-10-22 DIAGNOSIS — M2012 Hallux valgus (acquired), left foot: Secondary | ICD-10-CM

## 2022-10-22 NOTE — Telephone Encounter (Signed)
Inbound call from pt requesting to speak with a nurse regarding the SUPREP,patient states that she can afford it and need something else,Please advise

## 2022-11-01 NOTE — Progress Notes (Signed)
Patient presents today to pick up diabetic shoes and insoles.  Patient was dispensed 1 pair of diabetic shoes and 3 pairs of foam casted diabetic insoles.   She tried on the shoes with the insoles and the fit was satisfactory.   Will follow up next year for new order.    

## 2022-11-03 NOTE — Telephone Encounter (Signed)
Spoke with patient. Offered patient a Plenvu sample. She will pick up the sample and instructions on Friday.

## 2022-11-11 ENCOUNTER — Encounter (HOSPITAL_COMMUNITY): Payer: Self-pay | Admitting: *Deleted

## 2022-11-11 ENCOUNTER — Ambulatory Visit (HOSPITAL_COMMUNITY)
Admission: EM | Admit: 2022-11-11 | Discharge: 2022-11-11 | Disposition: A | Discharge status: Home / Self Care | Payer: Medicare Other | Attending: Sports Medicine | Admitting: Sports Medicine

## 2022-11-11 ENCOUNTER — Ambulatory Visit (INDEPENDENT_AMBULATORY_CARE_PROVIDER_SITE_OTHER): Payer: Medicare Other

## 2022-11-11 ENCOUNTER — Ambulatory Visit (INDEPENDENT_AMBULATORY_CARE_PROVIDER_SITE_OTHER): Payer: Medicare Other | Admitting: Podiatry

## 2022-11-11 ENCOUNTER — Encounter: Payer: Self-pay | Admitting: Podiatry

## 2022-11-11 DIAGNOSIS — L980 Pyogenic granuloma: Secondary | ICD-10-CM | POA: Diagnosis not present

## 2022-11-11 DIAGNOSIS — M79674 Pain in right toe(s): Secondary | ICD-10-CM

## 2022-11-11 DIAGNOSIS — L02611 Cutaneous abscess of right foot: Secondary | ICD-10-CM

## 2022-11-11 DIAGNOSIS — T148XXA Other injury of unspecified body region, initial encounter: Secondary | ICD-10-CM | POA: Diagnosis not present

## 2022-11-11 DIAGNOSIS — L03031 Cellulitis of right toe: Secondary | ICD-10-CM

## 2022-11-11 NOTE — Progress Notes (Signed)
Subjective:   Patient ID: [name], female   DOB: 77 y.o.   MRN: [id number]   HPI Patient presents stating she has developed a painful area at the end of her right big toe that has been sore and she is nervous because she is a diabetic and has poor circulation   ROS      Objective:  Physical Exam  Neurovascular status unchanged from previous visit with a small distal blister of the right big toe that may have a small abscess within it that is localized.  No proximal edema edema drainage nailbed is slightly thickened     Assessment:  Difficult situation with a diabetic with PVD who does have a small distal abscess right big toe but localized     Plan:  H&P x-ray reviewed using sterile instrumentation I did debride the area and flushed I found to be superficial and I am hoping that this will resolve the condition.  If it were to worsen or spread or proximal issues were to occur we will see back and it is possible amputation could be ultimate result.  At this point I am confident at this point this should take care of the problem and was instructed patient on warm water soaks  X-rays were negative for signs of bony changes associated with condition

## 2022-11-11 NOTE — Discharge Instructions (Addendum)
For that pain on your toe I suspect this is a blood blister, I recommend close follow-up with your podiatrist.  Pad that area with a thick sock until you are able to see them.  If you are unable to see them over the next 5 days recommend follow-up with your primary care provider. For the spot on your face this appears to be a pyogenic granuloma, these can bleed quite a bit especially if hit with your razor.  Follow-up with your primary care provider or dermatology in 1 week for wound recheck.

## 2022-11-11 NOTE — ED Provider Notes (Signed)
[place]    CSN: [id number] Arrival date & time: 11/11/22  0802      History   Chief Complaint Chief Complaint  Patient presents with   Toe Pain   Facial Pain    HPI [name] is a 77 y.o. female.   [name] is here today with chief complaint of left great toe pain and pain on her face, right lower chin.  In setting of her right great toe she reports she noticed the pain last night and when she looked at her foot she saw small area of black blistering.  She was wearing green socks at the time and believes it may have been some rubbing off from her sock.  Of note she is a diabetic and sees podiatry regularly.  She recently got a new set of diabetic shoes about 10 days ago.  Denies any trauma to her toe.  She is unable to put on her close toed shoe secondary to the pain.  Denies any fevers or chills. Light of her face pain, she noticed a small lesion on her right lower chin multiple days ago.  She does shave that area due to some facial hair and reports she nicked it with a razor and had some bleeding  She reports the past couple days the area has grown in size.  Denies any decreased appetite, unintentional weight loss, fevers chills or night sweats.   Toe Pain    Past Medical History:  Diagnosis Date   Anemia    Anxiety    hx   Arthritis    "my whole body"   Atrial septal aneurysm    COLONIC POLYPS, HX OF    COPD (chronic obstructive pulmonary disease) (HCC)    no per pt on 03/21/2014 & 02/06/2018   Coronary artery disease    a. s/p CABG 2012.   DISC DISEASE, CERVICAL    Gastroesophageal reflux disease    Headache    "I have headaches all the time" (02/06/2018)   Heart murmur    as a child   History of rheumatic fever    Hyperlipidemia    Hypertension    Hypothyroidism    Insomnia    Osteoarthritis    OSTEOPENIA    PAD (peripheral artery disease) ([place])    a. PTA to R SFA 2015. b. PTA to L SFA 05/2016.   Plantar fasciitis of left foot  10/02/2019   Pre-diabetes    RSD (reflex sympathetic dystrophy) 10/31/2011   Vitamin D deficiency     Patient Active Problem List   Diagnosis Date Noted   Hx of colonic polyp 10/20/2022   Nausea without vomiting 10/20/2022   Constipation 10/20/2022   Generalized abdominal pain 10/20/2022   Carotid artery disease ([place]) 09/03/2022   Adenocarcinoma of right lung, stage 4 ([place]) 01/07/2022   S/P partial lobectomy of lung 12/11/2021   Lung nodule 11/02/2021   Osteoarthritis of cervical spine with myelopathy 02/09/2021   Spasticity 02/09/2021   Lumbar radiculopathy, chronic 02/09/2021   Abnormality of gait 02/09/2021   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   Plantar fasciitis of left foot 10/02/2019   Status post partial hysterectomy 10/24/2018   Discharge from the vagina 10/24/2018   Claudication in peripheral vascular disease ([place]) 02/06/2018   Chronic idiopathic constipation 10/07/2017   CAD in native artery 06/08/2016   Skin inflammation 04/06/2016   Contracture of hand joint 04/06/2016   Microhematuria 11/12/2015   Left shoulder pain 10/15/2015  Trapezius muscle spasm 09/24/2015   Type 2 diabetes mellitus ([place]) 08/07/2015   Urinary frequency 04/04/2015   External hemorrhoid 11/02/2014   Left-sided thoracic back pain 10/03/2014   Gingivitis 10/03/2014   Routine general medical examination at a health care facility 09/10/2014   Total knee replacement status 06/18/2014   Tooth pain 04/18/2014   Claudication ([place]) 03/21/2014   Primary localized osteoarthrosis, lower leg 08/10/2013   Right knee pain 08/02/2013   Upper respiratory infection 08/02/2013   Dyspepsia 11/16/2012   Knee pain, right 02/18/2012   PAD (peripheral artery disease) ([place]) 12/03/2011   PVD (peripheral vascular disease) ([place]) 10/31/2011   RSD (reflex sympathetic dystrophy) 10/31/2011   Vitamin D deficiency    Preop exam for internal medicine 10/28/2011   Anxiety 03/11/2011   COPD (chronic obstructive  pulmonary disease) ([place]) 02/02/2011   Anemia 02/02/2011   History of rheumatic fever 02/02/2011   Insomnia 02/02/2011   Coronary atherosclerosis 09/08/2010   CONSTIPATION 02/19/2010   TOBACCO ABUSE 07/21/2009   MURMUR 06/17/2009   [place] DISEASE, CERVICAL 03/20/2009   Pain in Soft Tissues of Limb 05/23/2008   Hypothyroidism 09/07/2007   Hyperlipidemia LDL goal <70 09/07/2007   Essential hypertension 09/07/2007   ALLERGIC RHINITIS 09/07/2007   GERD 09/07/2007   Primary osteoarthritis of right knee 09/07/2007   Osteopenia 09/07/2007   COLONIC POLYPS, HX OF 09/07/2007    Past Surgical History:  Procedure Laterality Date   ABDOMINAL HYSTERECTOMY  1977   "partial"   BALLOON ANGIOPLASTY, ARTERY Right 03/21/2014   SFA   CARDIAC CATHETERIZATION  2011    CARPAL TUNNEL RELEASE Right 2005   CATARACT EXTRACTION Left 2013   CORONARY ARTERY BYPASS GRAFT  10/2009   LIMA to the LAD, saphenous vein graft to the acute marginal, saphenous vein graft to the diagonal and obtuse marginal.   DILATION AND CURETTAGE OF UTERUS  1978   INTERCOSTAL NERVE BLOCK Right 12/11/2021   Procedure: INTERCOSTAL NERVE BLOCK;  Surgeon: Melrose Nakayama, MD;  Location: [place];  Service: Thoracic;  Laterality: Right;   JOINT REPLACEMENT     LOWER EXTREMITY ANGIOGRAM N/A 03/21/2014   Procedure: LOWER EXTREMITY ANGIOGRAM;  Surgeon: Lorretta Harp, MD;  Location[facility];  Service: Cardiovascular;  Laterality: N/A;   LOWER EXTREMITY ANGIOGRAM  06/07/2016   Abdominal aortogram/bilateral iliac angiogram/bifemoral runoff   LOWER EXTREMITY ANGIOGRAPHY N/A 02/06/2018   Procedure: LOWER EXTREMITY ANGIOGRAPHY;  Surgeon: Lorretta Harp, MD;  Location: [place] CV LAB;  Service: Cardiovascular;  Laterality: N/A;   LYMPH NODE DISSECTION  12/11/2021   Procedure: LYMPH NODE DISSECTION;  Surgeon: Melrose Nakayama, MD;  Location: [place];  Service: Thoracic;;   PERIPHERAL VASCULAR ATHERECTOMY  02/06/2018   Procedure:  PERIPHERAL VASCULAR ATHERECTOMY;  Surgeon: Lorretta Harp, MD;  Location: [place] CV LAB;  Service: Cardiovascular;;   PERIPHERAL VASCULAR CATHETERIZATION Bilateral 06/07/2016   Procedure: Lower Extremity Angiography;  Surgeon: Lorretta Harp, MD;  Location: [place] CV LAB;  Service: Cardiovascular;  Laterality: Bilateral;   PERIPHERAL VASCULAR CATHETERIZATION Left 06/07/2016   Procedure: Peripheral Vascular Atherectomy;  Surgeon: Lorretta Harp, MD;  Location: [place] CV LAB;  Service: Cardiovascular;  Laterality: Left;  SFA   PERIPHERAL VASCULAR CATHETERIZATION Left 06/07/2016   Procedure: Peripheral Vascular Balloon Angioplasty;  Surgeon: Lorretta Harp, MD;  Location: [place] CV [facility];  Service: Cardiovascular;  Laterality: Left;  SFA   THYROIDECTOMY  1995   TONSILLECTOMY     TOTAL KNEE  ARTHROPLASTY Right 06/18/2014   Procedure: RIGHT TOTAL KNEE ARTHROPLASTY;  Surgeon: Tobi Bastos, MD;  Location: [facility];  Service: Orthopedics;  Laterality: Right;   XI ROBOTIC ASSISTED THORACOSCOPY- SEGMENTECTOMY Right 12/11/2021   Procedure: XI ROBOTIC ASSISTED THORACOSCOPY-RIGHT LOWER LOBE SUPERIOR SEGMENT, RIGHT UPPER LOBE WEDGE;  Surgeon: Melrose Nakayama, MD;  Location: [name];  Service: Thoracic;  Laterality: Right;    OB History   No obstetric history on file.      Home Medications    Prior to Admission medications   Medication Sig Start Date End Date Taking? Authorizing Provider  albuterol (VENTOLIN HFA) 108 (90 Base) MCG/ACT inhaler  12/15/18  Yes [provider]  amLODipine (NORVASC) 10 MG tablet TAKE ONE TABLET BY MOUTH ONCE DAILY 08/19/16  Yes Biagio Borg, MD  aspirin EC 81 MG tablet Take 81 mg by mouth daily.   Yes [provider]  Calcium Carb-Cholecalciferol (CALCIUM + D3) 600-800 MG-UNIT TABS Take 1 tablet by mouth 2 (two) times daily. 08/04/09  Yes [provider]  cyclobenzaprine (FLEXERIL) 10 MG tablet Take 10 mg by mouth at  bedtime as needed for muscle spasms. 12/25/20  Yes [provider]  diclofenac Sodium (VOLTAREN) 1 % GEL daily as needed. 11/16/19  Yes [provider]  ezetimibe (ZETIA) 10 MG tablet Take 10 mg by mouth daily. 06/18/20  Yes [provider]  fluticasone (FLONASE) 50 MCG/ACT nasal spray USE 1 TO 2 SPRAY(S) IN EACH NOSTRIL ONCE DAILY AS NEEDED FOR 30 DAYS 12/02/20  Yes [provider]  gabapentin (NEURONTIN) 100 MG capsule Take 2 capsules (200 mg total) by mouth 3 (three) times daily. For control of nerve pain at site of surgery. 03/15/22  Yes Melrose Nakayama, MD  Lancets [facility] DELICA PLUS [id number]) [place] 1 each by Other route 2 (two) times daily. 06/29/18  Yes [provider]  levothyroxine (SYNTHROID) 100 MCG tablet Take 100 mcg by mouth daily before breakfast.   Yes [provider]  lubiprostone (AMITIZA) 24 MCG capsule Take 24 mcg by mouth daily. 08/27/22  Yes [provider]  meloxicam (MOBIC) 7.5 MG tablet Take 7.5 mg by mouth daily as needed for pain. 12/02/20  Yes [provider]  Multiple Vitamin (MULTIVITAMIN WITH MINERALS) TABS tablet Take 1 tablet by mouth every morning. Centrum   Yes [provider]  ondansetron (ZOFRAN) 4 MG tablet Take 4 mg by mouth every 8 (eight) hours as needed. 04/05/22  Yes [provider]  ONETOUCH VERIO test strip 1 each by Other route 2 (two) times daily. 06/28/18  Yes [provider]  pantoprazole (PROTONIX) 40 MG tablet Take 1 tablet (40 mg total) by mouth daily before breakfast. 10/21/21  Yes Noemi Chapel A, NP  polyethylene glycol (MIRALAX) 17 g packet Take 17 g by mouth daily. 10/07/22  Yes Horton, Alvin Critchley, DO  Propylene Glycol 0.6 % SOLN Place 1 drop into both eyes every 4 (four) hours as needed (dry eyes).   Yes [provider]  senna-docusate (SENOKOT-S) 8.6-50 MG tablet Take 1 tablet by mouth daily. 10/07/22  Yes Horton, Alvin Critchley, DO  traMADol  (ULTRAM) 50 MG tablet Take 1 tablet (50 mg total) by mouth every 6 (six) hours as needed. 12/24/21  Yes Melrose Nakayama, MD  Vitamin D, Ergocalciferol, (DRISDOL) 1.25 MG (50000 UNIT) CAPS capsule Take 50,000 Units by mouth once a week. 03/14/20  Yes [provider]    Family History Family History  Problem  Relation Age of Onset   Arthritis Maternal Aunt    Heart disease Maternal Aunt    Hypertension Maternal Aunt    Diabetes Maternal Aunt    Colon cancer Neg Hx    Kidney disease Neg Hx    Liver disease Neg Hx    Throat cancer Neg Hx    Stomach cancer Neg Hx    Colon polyps Neg Hx     Social History Social History   Tobacco Use   Smoking status: Former    Packs/day: 0.80    Years: 45.00    Additional pack years: 0.00    Total pack years: 36.00    Types: Cigarettes    Quit date: 08/23/2009    Years since quitting: 13.2   Smokeless tobacco: Never  Vaping Use   Vaping Use: Never used  Substance Use Topics   Alcohol use: Not Currently    Alcohol/week: 2.0 standard drinks of alcohol    Types: 2 Glasses of wine per week   Drug use: Never     Allergies   Lipitor [atorvastatin]   Review of Systems Review of Systems As listed above in HPI  Physical Exam Triage Vital Signs ED Triage Vitals  Enc Vitals Group     BP 11/11/22 0836 128/74     Pulse Rate 11/11/22 0836 81     Resp 11/11/22 0836 20     Temp 11/11/22 0836 (!) 97.5 F (36.4 C)     Temp Source 11/11/22 0836 Oral     SpO2 11/11/22 0836 96 %     Weight --      Height --      Head Circumference --      Peak Flow --      Pain Score 11/11/22 0834 10     Pain Loc --      Pain Edu? --      Excl. in [place]? --    No data found.  Updated Vital Signs BP 128/74 (BP Location: Right Arm)   Pulse 81   Temp (!) 97.5 F (36.4 C) (Oral)   Resp 20   SpO2 96%   Physical Exam Vitals reviewed.  Constitutional:      General: She is not in acute distress.    Appearance: Normal appearance. She is not  ill-appearing, toxic-appearing or diaphoretic.  HENT:     Head:     Comments: Right lower jaw: There is a small erythematous papule, no drainage from the area.  No streaking surrounding the lesion. Cardiovascular:     Rate and Rhythm: Normal rate.  Pulmonary:     Effort: Pulmonary effort is normal.  Musculoskeletal:     Comments: Right foot: No obvious deformity or asymmetry.  There is a small circular discolored, black lesion at the tip of her first great toe.  Tenderness to palpation over the area.  Full range of motion of the great toe.  Cap refill less than 2 seconds difficult to appreciate dorsalis pedis pulse.  Sensation intact to light touch.  No erythema or drainage from the lesion.  Skin:    General: Skin is warm.  Neurological:     Mental Status: She is alert.      UC Treatments / Results  Labs (all labs ordered are listed, but only abnormal results are displayed) Labs Reviewed - No data to display  EKG   Radiology No results found.  Procedures Procedures (including critical care time)  Medications Ordered in UC Medications - No  data to display  Initial Impression / Assessment and Plan / UC Course  I have reviewed the triage vital signs and the nursing notes.  Pertinent labs & imaging results that were available during my care of the patient were reviewed by me and considered in my medical decision making (see chart for details).     Blood blister right great toe Patient reports she is a well-controlled diabetic, likely secondary to pressure area and her new diabetic shoes, I recommend prompt follow-up with podiatry or her primary care provider.  I recommend she pad the area with a thick sock.  She may continue Epsom salt.  No concern for infection at this time Right lower mandible pyogenic granuloma Discussed with patient this is most likely benign in nature but I recommend follow-up with her primary care provider or dermatology for further classification.   Avoid shaving the area or disruptive sources over the area.  Final Clinical Impressions(s) / UC Diagnoses   Final diagnoses:  Blood blister  Pain of toe of right foot  Pyogenic granuloma     Discharge Instructions      For that pain on your toe I suspect this is a blood blister, I recommend close follow-up with your podiatrist.  Pad that area with a thick sock until you are able to see them.  If you are unable to see them over the next 5 days recommend follow-up with your primary care provider. For the spot on your face this appears to be a pyogenic granuloma, these can bleed quite a bit especially if hit with your razor.  Follow-up with your primary care provider or dermatology in 1 week for wound recheck.     ED Prescriptions   None    PDMP not reviewed this encounter.   Elmore Guise, DO 11/11/22 [phone]

## 2022-11-11 NOTE — ED Triage Notes (Signed)
Pt states her right great toe is black on the end and hurting it started yesterday.   She has a spot on her chin on the right side that has been there X 2 weeks. She is unsure what happened to it.

## 2022-11-12 ENCOUNTER — Other Ambulatory Visit: Payer: Self-pay | Admitting: Podiatry

## 2022-11-12 DIAGNOSIS — M79674 Pain in right toe(s): Secondary | ICD-10-CM

## 2022-11-12 DIAGNOSIS — L02611 Cutaneous abscess of right foot: Secondary | ICD-10-CM

## 2022-11-17 ENCOUNTER — Encounter: Payer: Self-pay | Admitting: Internal Medicine

## 2022-11-29 ENCOUNTER — Ambulatory Visit (AMBULATORY_SURGERY_CENTER): Payer: Medicare Other | Admitting: Internal Medicine

## 2022-11-29 ENCOUNTER — Encounter: Payer: Self-pay | Admitting: Internal Medicine

## 2022-11-29 VITALS — BP 146/68 | HR 72 | Temp 96.8°F | Resp 15 | Ht 63.0 in | Wt 132.0 lb

## 2022-11-29 DIAGNOSIS — D123 Benign neoplasm of transverse colon: Secondary | ICD-10-CM

## 2022-11-29 DIAGNOSIS — K2970 Gastritis, unspecified, without bleeding: Secondary | ICD-10-CM | POA: Diagnosis not present

## 2022-11-29 DIAGNOSIS — K319 Disease of stomach and duodenum, unspecified: Secondary | ICD-10-CM

## 2022-11-29 DIAGNOSIS — D122 Benign neoplasm of ascending colon: Secondary | ICD-10-CM

## 2022-11-29 DIAGNOSIS — Z86010 Personal history of colonic polyps: Secondary | ICD-10-CM

## 2022-11-29 DIAGNOSIS — K2210 Ulcer of esophagus without bleeding: Secondary | ICD-10-CM

## 2022-11-29 DIAGNOSIS — Z09 Encounter for follow-up examination after completed treatment for conditions other than malignant neoplasm: Secondary | ICD-10-CM | POA: Diagnosis not present

## 2022-11-29 DIAGNOSIS — K219 Gastro-esophageal reflux disease without esophagitis: Secondary | ICD-10-CM

## 2022-11-29 DIAGNOSIS — D125 Benign neoplasm of sigmoid colon: Secondary | ICD-10-CM

## 2022-11-29 MED ORDER — PANTOPRAZOLE SODIUM 40 MG PO TBEC
40.0000 mg | DELAYED_RELEASE_TABLET | Freq: Two times a day (BID) | ORAL | 3 refills | Status: DC
Start: 2022-11-29 — End: 2023-01-20

## 2022-11-29 MED ORDER — SODIUM CHLORIDE 0.9 % IV SOLN: 500.0000 mL | Freq: Once | INTRAVENOUS | Status: DC

## 2022-11-29 NOTE — Op Note (Signed)
[facility] Patient Name: Alicia Zayanna Pundtcedure Date: 11/29/2022 12:54 PM MRN: [id number] Endoscopist: Alicia Fiedler , MD, [id number] Age: 77 Referring MD:  Date of Birth: [birth year] Gender: Female Account #: [id number] Procedure:                Colonoscopy Indications:              High risk colon cancer surveillance: Personal                            history of adenoma (10 mm or greater in size), Last                            colonoscopy: June 2020 Medicines:                Monitored Anesthesia Care Procedure:                Pre-Anesthesia Assessment:                           - Prior to the procedure, a History and Physical                            was performed, and patient medications and                            allergies were reviewed. The patient's tolerance of                            previous anesthesia was also reviewed. The risks                            and benefits of the procedure and the sedation                            options and risks were discussed with the patient.                            All questions were answered, and informed consent                            was obtained. Prior Anticoagulants: The patient has                            taken no anticoagulant or antiplatelet agents. ASA                            Grade Assessment: III - A patient with severe                            systemic disease. After reviewing the risks and                            benefits, the patient was deemed in satisfactory  condition to undergo the procedure.                           After obtaining informed consent, the colonoscope                            was passed under direct vision. Throughout the                            procedure, the patient's blood pressure, pulse, and                            oxygen saturations were monitored continuously. The                            Olympus PCF-H190DL ([id number])  Colonoscope was                            introduced through the anus and advanced to the                            cecum, identified by appendiceal orifice and                            ileocecal valve. The colonoscopy was performed                            without difficulty. The patient tolerated the                            procedure well. The quality of the bowel                            preparation was good. The ileocecal valve,                            appendiceal orifice, and rectum were photographed. Scope In: 1:44:54 PM Scope Out: 2:03:16 PM Scope Withdrawal Time: 0 hours 13 minutes 2 seconds  Total Procedure Duration: 0 hours 18 minutes 22 seconds  Findings:                 The digital rectal exam was normal.                           A 5 mm polyp was found in the ascending colon. The                            polyp was sessile. The polyp was removed with a                            cold snare. Resection and retrieval were complete.                           Two sessile polyps were found in the transverse  colon. The polyps were 5 to 7 mm in size. These                            polyps were removed with a cold snare. Resection                            and retrieval were complete.                           Two sessile polyps were found in the sigmoid colon.                            The polyps were 3 to 4 mm in size. These polyps                            were removed with a cold snare. Resection and                            retrieval were complete.                           Many medium-mouthed and small-mouthed diverticula                            were found in the sigmoid colon, descending colon,                            transverse colon and ascending colon.                           The retroflexed view of the distal rectum and anal                            verge was normal and showed no anal or rectal                             abnormalities. Complications:            No immediate complications. Estimated Blood Loss:     Estimated blood loss was minimal. Impression:               - One 5 mm polyp in the ascending colon, removed                            with a cold snare. Resected and retrieved.                           - Two 5 to 7 mm polyps in the transverse colon,                            removed with a cold snare. Resected and retrieved.                           - Two 3  to 4 mm polyps in the sigmoid colon,                            removed with a cold snare. Resected and retrieved.                           - Severe diverticulosis in the sigmoid colon, in                            the descending colon, in the transverse colon and                            in the ascending colon.                           - The distal rectum and anal verge are normal on                            retroflexion view. Recommendation:           - Patient has a contact number available for                            emergencies. The signs and symptoms of potential                            delayed complications were discussed with the                            patient. Return to normal activities tomorrow.                            Written discharge instructions were provided to the                            patient.                           - Resume previous diet.                           - Continue present medications.                           - Await pathology results.                           - No recommendation at this time regarding repeat                            colonoscopy due to age at next surveillance                            interval (> 5771yrs). Alicia FiedlerJay M Raymondo Garcialopez, MD 11/29/2022 2:11:07 PM This report has been signed electronically.

## 2022-11-29 NOTE — Progress Notes (Unsigned)
Pt's states no medical or surgical changes since previsit or office visit. 

## 2022-11-29 NOTE — Op Note (Signed)
[facility] Patient Name: [name] Procedure Date: 11/29/2022 12:55 PM MRN: [id number] Endoscopist: Beverley Fiedler , MD, [id number] Age: 77 Referring MD:  Date of Birth: Jul 24, 1946 Gender: Female Account #: [id number] Procedure:                Upper GI endoscopy Indications:              Suspected gastro-esophageal reflux disease, Nausea,                            currently on H2 blocker Medicines:                Monitored Anesthesia Care Procedure:                Pre-Anesthesia Assessment:                           - Prior to the procedure, a History and Physical                            was performed, and patient medications and                            allergies were reviewed. The patient's tolerance of                            previous anesthesia was also reviewed. The risks                            and benefits of the procedure and the sedation                            options and risks were discussed with the patient.                            All questions were answered, and informed consent                            was obtained. Prior Anticoagulants: The patient has                            taken no anticoagulant or antiplatelet agents. ASA                            Grade Assessment: III - A patient with severe                            systemic disease. After reviewing the risks and                            benefits, the patient was deemed in satisfactory                            condition to undergo the procedure.  After obtaining informed consent, the endoscope was                            passed under direct vision. Throughout the                            procedure, the patient's blood pressure, pulse, and                            oxygen saturations were monitored continuously. The                            GIF [id number] #[id number] was introduced through the                            mouth, and advanced to the  second part of duodenum.                            The upper GI endoscopy was accomplished without                            difficulty. The patient tolerated the procedure                            well. Scope In: Scope Out: Findings:                 LA Grade C (one or more mucosal breaks continuous                            between tops of 2 or more mucosal folds, less than                            75% circumference) esophagitis was found 35 to 36                            cm from the incisors. Biopsies were taken with a                            cold forceps for histology.                           A 4 cm hiatal hernia was present.                           The gastroesophageal flap valve was visualized                            endoscopically and classified as Hill Grade IV (no                            fold, wide open lumen, hiatal hernia present).  Patchy mild inflammation characterized by                            congestion (edema) and erythema was found in the                            gastric antrum. Biopsies were taken with a cold                            forceps for histology and Helicobacter pylori                            testing.                           The examined duodenum was normal. Complications:            No immediate complications. Estimated Blood Loss:     Estimated blood loss was minimal. Impression:               - LA Grade C reflux esophagitis. Biopsied.                           - 4 cm hiatal hernia.                           - Gastritis. Biopsied.                           - Normal examined duodenum. Recommendation:           - Patient has a contact number available for                            emergencies. The signs and symptoms of potential                            delayed complications were discussed with the                            patient. Return to normal activities tomorrow.                             Written discharge instructions were provided to the                            patient.                           - Resume previous diet.                           - Continue present medications. Change back to                            pantoprazole, but at 40 mg BID-AC.                           -  Await pathology results.                           - Office follow-up in 6-8 weeks with me or Alcide Evener, NP. If persistent symptoms would                            repeat EGD to assess healing. Beverley Fiedler, MD 11/29/2022 2:08:29 PM This report has been signed electronically.

## 2022-11-29 NOTE — Progress Notes (Unsigned)
GASTROENTEROLOGY PROCEDURE H&P NOTE   Primary Care Physician: Raymon MuttonSmith, Fred A Jr., FNP    Reason for Procedure:  GERD, nausea and history of adenomatous colon polyps  Plan:    Upper and lower endoscopy  Patient is appropriate for endoscopic procedure(s) in the ambulatory ([facility]) setting.  The nature of the procedure, as well as the risks, benefits, and alternatives were carefully and thoroughly reviewed with the patient. Ample time for discussion and questions allowed. The patient understood, was satisfied, and agreed to proceed.     HPI: [name] is a 77 y.o. female who presents for EGD and colonoscopy.  Medical history as below.  Tolerated the prep.  No recent chest pain or shortness of breath.  No abdominal pain today.  Past Medical History:  Diagnosis Date   Anemia    Anxiety    hx   Arthritis    "my whole body"   Atrial septal aneurysm    COLONIC POLYPS, HX OF    COPD (chronic obstructive pulmonary disease)    no per pt on 03/21/2014 & 02/06/2018   Coronary artery disease    a. s/p CABG 2012.   DISC DISEASE, CERVICAL    Gastroesophageal reflux disease    Headache    "I have headaches all the time" (02/06/2018)   Heart murmur    as a child   History of rheumatic fever    Hyperlipidemia    Hypertension    Hypothyroidism    Insomnia    Osteoarthritis    OSTEOPENIA    PAD (peripheral artery disease)    a. PTA to R SFA 2015. b. PTA to L SFA 05/2016.   Plantar fasciitis of left foot 10/02/2019   Pre-diabetes    RSD (reflex sympathetic dystrophy) 10/31/2011   Vitamin D deficiency     Past Surgical History:  Procedure Laterality Date   ABDOMINAL HYSTERECTOMY  1977   "partial"   BALLOON ANGIOPLASTY, ARTERY Right 03/21/2014   SFA   CARDIAC CATHETERIZATION  2011    CARPAL TUNNEL RELEASE Right 2005   CATARACT EXTRACTION Left 2013   CORONARY ARTERY BYPASS GRAFT  10/2009   LIMA to the LAD, saphenous vein graft to the acute marginal, saphenous vein graft to  the diagonal and obtuse marginal.   DILATION AND CURETTAGE OF UTERUS  1978   INTERCOSTAL NERVE BLOCK Right 12/11/2021   Procedure: INTERCOSTAL NERVE BLOCK;  Surgeon: Loreli SlotHendrickson, Steven C, MD;  [facility];  Service: Thoracic;  Laterality: Right;   JOINT REPLACEMENT     LOWER EXTREMITY ANGIOGRAM N/A 03/21/2014   Procedure: LOWER EXTREMITY ANGIOGRAM;  Surgeon: Runell GessJonathan J Berry, MD;  [facility];  Service: Cardiovascular;  Laterality: N/A;   LOWER EXTREMITY ANGIOGRAM  06/07/2016   Abdominal aortogram/bilateral iliac angiogram/bifemoral runoff   LOWER EXTREMITY ANGIOGRAPHY N/A 02/06/2018   Procedure: LOWER EXTREMITY ANGIOGRAPHY;  Surgeon: Runell GessBerry, Jonathan J, MD;  Location: [facility] INVASIVE CV LAB;  Service: Cardiovascular;  Laterality: N/A;   LYMPH NODE DISSECTION  12/11/2021   Procedure: LYMPH NODE DISSECTION;  Surgeon: Loreli SlotHendrickson, Steven C, MD;  [facility];  Service: Thoracic;;   PERIPHERAL VASCULAR ATHERECTOMY  02/06/2018   Procedure: PERIPHERAL VASCULAR ATHERECTOMY;  Surgeon: Runell GessBerry, Jonathan J, MD;  Location: [facility] INVASIVE CV LAB;  Service: Cardiovascular;;   PERIPHERAL VASCULAR CATHETERIZATION Bilateral 06/07/2016   Procedure: Lower Extremity Angiography;  Surgeon: Runell GessJonathan J Berry, MD;  [facility];  Service: Cardiovascular;  Laterality: Bilateral;   PERIPHERAL VASCULAR  CATHETERIZATION Left 06/07/2016   Procedure: Peripheral Vascular Atherectomy;  Surgeon: Runell Gess, MD;  Location: [facility] INVASIVE CV LAB;  Service: Cardiovascular;  Laterality: Left;  SFA   PERIPHERAL VASCULAR CATHETERIZATION Left 06/07/2016   Procedure: Peripheral Vascular Balloon Angioplasty;  Surgeon: Runell Gess, MD;  Location: [facility] INVASIVE CV LAB;  Service: Cardiovascular;  Laterality: Left;  SFA   THYROIDECTOMY  1995   TONSILLECTOMY     TOTAL KNEE ARTHROPLASTY Right 06/18/2014   Procedure: RIGHT TOTAL KNEE ARTHROPLASTY;  Surgeon: Jacki Cones, MD;  Location: [facility] ORS;  Service:  Orthopedics;  Laterality: Right;   XI ROBOTIC ASSISTED THORACOSCOPY- SEGMENTECTOMY Right 12/11/2021   Procedure: XI ROBOTIC ASSISTED THORACOSCOPY-RIGHT LOWER LOBE SUPERIOR SEGMENT, RIGHT UPPER LOBE WEDGE;  Surgeon: Loreli Slot, MD;  Location: [facility] OR;  Service: Thoracic;  Laterality: Right;    Prior to Admission medications   Medication Sig Start Date End Date Taking? Authorizing Provider  albuterol (VENTOLIN HFA) 108 (90 Base) MCG/ACT inhaler  12/15/18   [provider]  amLODipine (NORVASC) 10 MG tablet TAKE ONE TABLET BY MOUTH ONCE DAILY 08/19/16   Corwin Levins, MD  aspirin EC 81 MG tablet Take 81 mg by mouth daily.    [provider]  Calcium Carb-Cholecalciferol (CALCIUM + D3) 600-800 MG-UNIT TABS Take 1 tablet by mouth 2 (two) times daily. 08/04/09   [provider]  cyclobenzaprine (FLEXERIL) 10 MG tablet Take 10 mg by mouth at bedtime as needed for muscle spasms. 12/25/20   [provider]  diclofenac Sodium (VOLTAREN) 1 % GEL daily as needed. 11/16/19   [provider]  ezetimibe (ZETIA) 10 MG tablet Take 10 mg by mouth daily. 06/18/20   [provider]  fluticasone (FLONASE) 50 MCG/ACT nasal spray USE 1 TO 2 SPRAY(S) IN EACH NOSTRIL ONCE DAILY AS NEEDED FOR 30 DAYS 12/02/20   [provider]  gabapentin (NEURONTIN) 100 MG capsule Take 2 capsules (200 mg total) by mouth 3 (three) times daily. For control of nerve pain at site of surgery. 03/15/22   Loreli Slot, MD  Lancets [facility] DELICA PLUS [place]) MISC 1 each by Other route 2 (two) times daily. 06/29/18   [provider]  levothyroxine (SYNTHROID) 100 MCG tablet Take 100 mcg by mouth daily before breakfast.    [provider]  lubiprostone (AMITIZA) 24 MCG capsule Take 24 mcg by mouth daily. 08/27/22   [provider]  meloxicam (MOBIC) 7.5 MG tablet Take 7.5 mg by mouth daily as needed for pain. 12/02/20   [provider]   Multiple Vitamin (MULTIVITAMIN WITH MINERALS) TABS tablet Take 1 tablet by mouth every morning. Centrum    [provider]  ondansetron (ZOFRAN) 4 MG tablet Take 4 mg by mouth every 8 (eight) hours as needed. 04/05/22   [provider]  [facility] VERIO test strip 1 each by Other route 2 (two) times daily. 06/28/18   [provider]  pantoprazole (PROTONIX) 40 MG tablet Take 1 tablet (40 mg total) by mouth daily before breakfast. 10/21/21   Valentino Nose, NP  polyethylene glycol (MIRALAX) 17 g packet Take 17 g by mouth daily. 10/07/22   Horton, Clabe Seal, DO  Propylene Glycol 0.6 % SOLN Place 1 drop into both eyes every 4 (four) hours as needed (dry eyes).    [provider]  senna-docusate (SENOKOT-S) 8.6-50 MG tablet Take 1 tablet by mouth daily. 10/07/22   Horton, Clabe Seal, DO  traMADol Janean Sark)  50 MG tablet Take 1 tablet (50 mg total) by mouth every 6 (six) hours as needed. 12/24/21   Loreli Slot, MD  Vitamin D, Ergocalciferol, (DRISDOL) 1.25 MG (50000 UNIT) CAPS capsule Take 50,000 Units by mouth once a week. 03/14/20   [provider]    Current Outpatient Medications  Medication Sig Dispense Refill   albuterol (VENTOLIN HFA) 108 (90 Base) MCG/ACT inhaler      amLODipine (NORVASC) 10 MG tablet TAKE ONE TABLET BY MOUTH ONCE DAILY 90 tablet 1   aspirin EC 81 MG tablet Take 81 mg by mouth daily.     Calcium Carb-Cholecalciferol (CALCIUM + D3) 600-800 MG-UNIT TABS Take 1 tablet by mouth 2 (two) times daily.     cyclobenzaprine (FLEXERIL) 10 MG tablet Take 10 mg by mouth at bedtime as needed for muscle spasms.     diclofenac Sodium (VOLTAREN) 1 % GEL daily as needed.     ezetimibe (ZETIA) 10 MG tablet Take 10 mg by mouth daily.     fluticasone (FLONASE) 50 MCG/ACT nasal spray USE 1 TO 2 SPRAY(S) IN EACH NOSTRIL ONCE DAILY AS NEEDED FOR 30 DAYS     gabapentin (NEURONTIN) 100 MG capsule Take 2 capsules (200 mg total) by mouth 3 (three) times  daily. For control of nerve pain at site of surgery. 60 capsule 1   Lancets (ONETOUCH DELICA PLUS LANCET33G) MISC 1 each by Other route 2 (two) times daily.     levothyroxine (SYNTHROID) 100 MCG tablet Take 100 mcg by mouth daily before breakfast.     lubiprostone (AMITIZA) 24 MCG capsule Take 24 mcg by mouth daily.     meloxicam (MOBIC) 7.5 MG tablet Take 7.5 mg by mouth daily as needed for pain.     Multiple Vitamin (MULTIVITAMIN WITH MINERALS) TABS tablet Take 1 tablet by mouth every morning. Centrum     ondansetron (ZOFRAN) 4 MG tablet Take 4 mg by mouth every 8 (eight) hours as needed.     ONETOUCH VERIO test strip 1 each by Other route 2 (two) times daily.     pantoprazole (PROTONIX) 40 MG tablet Take 1 tablet (40 mg total) by mouth daily before breakfast. 60 tablet 1   polyethylene glycol (MIRALAX) 17 g packet Take 17 g by mouth daily. 14 each 0   Propylene Glycol 0.6 % SOLN Place 1 drop into both eyes every 4 (four) hours as needed (dry eyes).     senna-docusate (SENOKOT-S) 8.6-50 MG tablet Take 1 tablet by mouth daily. 14 tablet 0   traMADol (ULTRAM) 50 MG tablet Take 1 tablet (50 mg total) by mouth every 6 (six) hours as needed. 25 tablet 0   Vitamin D, Ergocalciferol, (DRISDOL) 1.25 MG (50000 UNIT) CAPS capsule Take 50,000 Units by mouth once a week.     No current facility-administered medications for this visit.   Facility-Administered Medications Ordered in Other Visits  Medication Dose Route Frequency Provider Last Rate Last Admin   tranexamic acid (CYKLOKAPRON) 2,000 mg in sodium chloride 0.9 % 50 mL Topical Application  2,000 mg Topical Once Porterfield, Amber, PA-C        Allergies as of 11/29/2022 - Review Complete 11/29/2022  Allergen Reaction Noted   Lipitor [atorvastatin] Other (See Comments) 04/30/2014    Family History  Problem Relation Age of Onset   Arthritis Maternal Aunt    Heart disease Maternal Aunt    Hypertension Maternal Aunt    Diabetes Maternal Aunt     Colon cancer  Neg Hx    Kidney disease Neg Hx    Liver disease Neg Hx    Throat cancer Neg Hx    Stomach cancer Neg Hx    Colon polyps Neg Hx     Social History   Socioeconomic History   Marital status: Divorced    Spouse name: Not on file   Number of children: 2   Years of education: 14   Highest education level: Not on file  Occupational History   Occupation: Retired    Associate Professor: RETIRED   Occupation: retired  Tobacco Use   Smoking status: Former    Packs/day: 0.80    Years: 45.00    Additional pack years: 0.00    Total pack years: 36.00    Types: Cigarettes    Quit date: 08/23/2009    Years since quitting: 13.2   Smokeless tobacco: Never  Vaping Use   Vaping Use: Never used  Substance and Sexual Activity   Alcohol use: Not Currently    Alcohol/week: 2.0 standard drinks of alcohol    Types: 2 Glasses of wine per week   Drug use: Never   Sexual activity: Not Currently  Other Topics Concern   Not on file  Social History Narrative   Not on file   Social Determinants of Health   Financial Resource Strain: Not on file  Food Insecurity: Not on file  Transportation Needs: Not on file  Physical Activity: Not on file  Stress: Not on file  Social Connections: Not on file  Intimate Partner Violence: Not on file    Physical Exam: Vital signs in last 24 hours: @BP  (!) 135/57   Pulse 85   Temp (!) 96.8 F (36 C) (Temporal)   Ht 5\' 3"  (1.6 m)   Wt 132 lb (59.9 kg)   SpO2 96%   BMI 23.38 kg/m  GEN: NAD EYE: Sclerae anicteric ENT: MMM CV: Non-tachycardic Pulm: CTA b/l GI: Soft, NT/ND NEURO:  Alert & Oriented x 3   Erick Blinks, MD [facility]  11/29/2022 1:26 PM

## 2022-11-29 NOTE — Patient Instructions (Signed)
DR. Sherald Barge office to call you with follow up appointment.  Handout on polyps and diverticulosis. Pick up new prescription.     YOU HAD AN ENDOSCOPIC PROCEDURE TODAY AT THE [facility]:   Refer to the procedure report that was given to you for any specific questions about what was found during the examination.  If the procedure report does not answer your questions, please call your gastroenterologist to clarify.  If you requested that your care partner not be given the details of your procedure findings, then the procedure report has been included in a sealed envelope for you to review at your convenience later.  YOU SHOULD EXPECT: Some feelings of bloating in the abdomen. Passage of more gas than usual.  Walking can help get rid of the air that was put into your GI tract during the procedure and reduce the bloating. If you had a lower endoscopy (such as a colonoscopy or flexible sigmoidoscopy) you may notice spotting of blood in your stool or on the toilet paper. If you underwent a bowel prep for your procedure, you may not have a normal bowel movement for a few days.  Please Note:  You might notice some irritation and congestion in your nose or some drainage.  This is from the oxygen used during your procedure.  There is no need for concern and it should clear up in a day or so.  SYMPTOMS TO REPORT IMMEDIATELY:  Following lower endoscopy (colonoscopy or flexible sigmoidoscopy):  Excessive amounts of blood in the stool  Significant tenderness or worsening of abdominal pains  Swelling of the abdomen that is new, acute  Fever of 100F or higher  Following upper endoscopy (EGD)  Vomiting of blood or coffee ground material  New chest pain or pain under the shoulder blades  Painful or persistently difficult swallowing  New shortness of breath  Fever of 100F or higher  Black, tarry-looking stools  For urgent or emergent issues, a gastroenterologist can be reached at any hour by  calling [phone]. Do not use MyChart messaging for urgent concerns.    DIET:  We do recommend a small meal at first, but then you may proceed to your regular diet.  Drink plenty of fluids but you should avoid alcoholic beverages for 24 hours.  ACTIVITY:  You should plan to take it easy for the rest of today and you should NOT DRIVE or use heavy machinery until tomorrow (because of the sedation medicines used during the test).    FOLLOW UP: Our staff will call the number listed on your records the next business day following your procedure.  We will call around 7:15- 8:00 am to check on you and address any questions or concerns that you may have regarding the information given to you following your procedure. If we do not reach you, we will leave a message.     If any biopsies were taken you will be contacted by phone or by letter within the next 1-3 weeks.  Please call us at [phone] if you have not heard about the biopsies in 3 weeks.    SIGNATURES/CONFIDENTIALITY: You and/or your care partner have signed paperwork which will be entered into your electronic medical record.  These signatures attest to the fact that that the information above on your After Visit Summary has been reviewed and is understood.  Full responsibility of the confidentiality of this discharge information lies with you and/or your care-partner.

## 2022-11-29 NOTE — Progress Notes (Signed)
Nad vss trans to pacu 

## 2022-11-30 ENCOUNTER — Telehealth: Payer: Self-pay

## 2022-11-30 NOTE — Telephone Encounter (Signed)
  Follow up Call-     11/29/2022    1:18 PM  Call back number  Post procedure Call Back phone  # [phone]  Permission to leave phone message Yes     Patient questions:  Do you have a fever, pain , or abdominal swelling? No. Pain Score  0 *  Have you tolerated food without any problems? Yes.    Have you been able to return to your normal activities? Yes.    Do you have any questions about your discharge instructions: Diet   No. Medications  No. Follow up visit  No.  Do you have questions or concerns about your Care? No.  Actions: * If pain score is 4 or above: No action needed, pain <4.

## 2022-12-02 ENCOUNTER — Encounter: Payer: Self-pay | Admitting: Internal Medicine

## 2022-12-14 ENCOUNTER — Telehealth: Payer: Self-pay | Admitting: Internal Medicine

## 2022-12-14 NOTE — Telephone Encounter (Signed)
Spoke with pt and she states she has had a little loose stool since having her ECL. She is taking a probiotic [place]. She will try some imodium otc and she knows to call this back if the imodium does not help. Discussed path letters with pt also as she had not received them yet.

## 2022-12-14 NOTE — Telephone Encounter (Signed)
Patient called states she is having issues with her BM's since her procedure. Seeking advise.

## 2022-12-17 ENCOUNTER — Ambulatory Visit: Payer: Medicare Other | Admitting: Podiatry

## 2022-12-17 DIAGNOSIS — M79675 Pain in left toe(s): Secondary | ICD-10-CM

## 2022-12-17 DIAGNOSIS — Z87828 Personal history of other (healed) physical injury and trauma: Secondary | ICD-10-CM

## 2022-12-17 DIAGNOSIS — B351 Tinea unguium: Secondary | ICD-10-CM | POA: Diagnosis not present

## 2022-12-17 DIAGNOSIS — E119 Type 2 diabetes mellitus without complications: Secondary | ICD-10-CM

## 2022-12-17 DIAGNOSIS — M2041 Other hammer toe(s) (acquired), right foot: Secondary | ICD-10-CM

## 2022-12-17 DIAGNOSIS — E1151 Type 2 diabetes mellitus with diabetic peripheral angiopathy without gangrene: Secondary | ICD-10-CM | POA: Diagnosis not present

## 2022-12-17 DIAGNOSIS — M79674 Pain in right toe(s): Secondary | ICD-10-CM

## 2022-12-18 ENCOUNTER — Encounter: Payer: Self-pay | Admitting: Podiatry

## 2022-12-22 ENCOUNTER — Telehealth: Payer: Self-pay | Admitting: Internal Medicine

## 2022-12-22 NOTE — Telephone Encounter (Signed)
Called patient regarding upcoming May appointments, patient is notified.  ?

## 2023-01-04 ENCOUNTER — Ambulatory Visit (HOSPITAL_COMMUNITY): Payer: Medicare Other

## 2023-01-04 ENCOUNTER — Telehealth: Payer: Self-pay | Admitting: Medical Oncology

## 2023-01-04 ENCOUNTER — Inpatient Hospital Stay: Payer: Medicare Other

## 2023-01-04 ENCOUNTER — Telehealth: Payer: Self-pay | Admitting: Internal Medicine

## 2023-01-04 NOTE — Telephone Encounter (Signed)
CT scan  rescheduled.

## 2023-01-05 ENCOUNTER — Inpatient Hospital Stay: Payer: Medicare Other | Attending: Nurse Practitioner

## 2023-01-05 ENCOUNTER — Telehealth: Payer: Self-pay | Admitting: Internal Medicine

## 2023-01-05 DIAGNOSIS — C3490 Malignant neoplasm of unspecified part of unspecified bronchus or lung: Secondary | ICD-10-CM

## 2023-01-05 DIAGNOSIS — Z85118 Personal history of other malignant neoplasm of bronchus and lung: Secondary | ICD-10-CM | POA: Diagnosis present

## 2023-01-05 DIAGNOSIS — Z08 Encounter for follow-up examination after completed treatment for malignant neoplasm: Secondary | ICD-10-CM | POA: Diagnosis present

## 2023-01-05 LAB — CBC WITH DIFFERENTIAL (CANCER CENTER ONLY)
Abs Immature Granulocytes: 0.02 10*3/uL (ref 0.00–0.07)
Basophils Absolute: 0.1 10*3/uL (ref 0.0–0.1)
Basophils Relative: 1 %
Eosinophils Absolute: 0.2 10*3/uL (ref 0.0–0.5)
Eosinophils Relative: 3 %
HCT: 40.7 % (ref 36.0–46.0)
Hemoglobin: 13.9 g/dL (ref 12.0–15.0)
Immature Granulocytes: 0 %
Lymphocytes Relative: 37 %
Lymphs Abs: 2.3 10*3/uL (ref 0.7–4.0)
MCH: 29.9 pg (ref 26.0–34.0)
MCHC: 34.2 g/dL (ref 30.0–36.0)
MCV: 87.5 fL (ref 80.0–100.0)
Monocytes Absolute: 0.4 10*3/uL (ref 0.1–1.0)
Monocytes Relative: 6 %
Neutro Abs: 3.3 10*3/uL (ref 1.7–7.7)
Neutrophils Relative %: 53 %
Platelet Count: 260 10*3/uL (ref 150–400)
RBC: 4.65 MIL/uL (ref 3.87–5.11)
RDW: 13.7 % (ref 11.5–15.5)
WBC Count: 6.3 10*3/uL (ref 4.0–10.5)
nRBC: 0 % (ref 0.0–0.2)

## 2023-01-05 LAB — CMP (CANCER CENTER ONLY)
ALT: 10 U/L (ref 0–44)
AST: 13 U/L — ABNORMAL LOW (ref 15–41)
Albumin: 4.2 g/dL (ref 3.5–5.0)
Alkaline Phosphatase: 97 U/L (ref 38–126)
Anion gap: 8 (ref 5–15)
BUN: 13 mg/dL (ref 8–23)
CO2: 25 mmol/L (ref 22–32)
Calcium: 9.6 mg/dL (ref 8.9–10.3)
Chloride: 107 mmol/L (ref 98–111)
Creatinine: 0.69 mg/dL (ref 0.44–1.00)
GFR, Estimated: >60 mL/min (ref >60)
Glucose, Bld: 98 mg/dL (ref 70–99)
Potassium: 3.6 mmol/L (ref 3.5–5.1)
Sodium: 140 mmol/L (ref 135–145)
Total Bilirubin: 0.4 mg/dL (ref 0.3–1.2)
Total Protein: 7.3 g/dL (ref 6.5–8.1)

## 2023-01-06 ENCOUNTER — Inpatient Hospital Stay: Payer: Medicare Other | Admitting: Internal Medicine

## 2023-01-07 ENCOUNTER — Encounter (HOSPITAL_COMMUNITY): Payer: Self-pay

## 2023-01-07 ENCOUNTER — Ambulatory Visit (HOSPITAL_COMMUNITY)
Admission: RE | Admit: 2023-01-07 | Discharge: 2023-01-07 | Disposition: A | Discharge status: Home / Self Care | Payer: Medicare Other | Source: Ambulatory Visit | Attending: Internal Medicine | Admitting: Internal Medicine

## 2023-01-07 DIAGNOSIS — C3490 Malignant neoplasm of unspecified part of unspecified bronchus or lung: Secondary | ICD-10-CM | POA: Diagnosis present

## 2023-01-07 MED ORDER — IOHEXOL 300 MG/ML  SOLN
80.0000 mL | Freq: Once | INTRAMUSCULAR | Status: AC | PRN
  Administered 2023-01-07: 75 mL via INTRAVENOUS

## 2023-01-17 NOTE — Progress Notes (Unsigned)
[facility] OFFICE PROGRESS NOTE  Hillery Aldo, NP [address] [place] [address]  DIAGNOSIS: Multifocal likely stage IV (T1b, N0, M1 a) non-small cell lung cancer, adenocarcinoma presented with multiple bilateral pulmonary nodules diagnosed in April 2023. The patient had molecular studies by foundation 1 that showed no actionable mutations and PD-L1 expression was negative.  PRIOR THERAPY: Status post surgical resection of the 2 prominent pulmonary nodules in the right upper lobe as well as the right lower lobe with wedge resection of the right upper lobe nodule and superior segmentectomy of the right lower lobe on December 11, 2021 under the care of Dr. Dorris Fetch.   CURRENT THERAPY: Observation   INTERVAL HISTORY: BRYNN GAIR 77 y.o. female returns to the clinic today for a follow-up visit.  The patient is feeling fine today with no concerning complaints. She if followed for her history of NSCLC, with multifocal adenocarcinoma, s/p resection. She is on observation and feeling fine. She denies fever, chills, night sweats, or unexplained weight loss. She denied having any current chest pain, shortness of breath, or hemoptysis. She has a mild dry cough. She takes allergy medication. She has no nausea, vomiting, or diarrhea. She takes miralax for constipation if needed. She has no headache or visual changes.  She has some mild fatigue. She has a good appetite. She is here today for evaluation with repeat CT scan of the chest for restaging of her disease.    MEDICAL HISTORY: Past Medical History:  Diagnosis Date   Anemia    Anxiety    hx   Arthritis    "my whole body"   Atrial septal aneurysm    COLONIC POLYPS, HX OF    COPD (chronic obstructive pulmonary disease) (HCC)    no per pt on 03/21/2014 & 02/06/2018   Coronary artery disease    a. s/p CABG 2012.   DISC DISEASE, CERVICAL    Gastroesophageal reflux disease    Headache    "I have headaches all the time"  (02/06/2018)   Heart murmur    as a child   History of rheumatic fever    Hyperlipidemia    Hypertension    Hypothyroidism    Insomnia    Osteoarthritis    OSTEOPENIA    PAD (peripheral artery disease) (HCC)    a. PTA to R SFA 2015. b. PTA to L SFA 05/2016.   Plantar fasciitis of left foot 10/02/2019   Pre-diabetes    RSD (reflex sympathetic dystrophy) 10/31/2011   Vitamin D deficiency     ALLERGIES:  is allergic to lipitor [atorvastatin].  MEDICATIONS:  Current Outpatient Medications  Medication Sig Dispense Refill   lisinopril (ZESTRIL) 2.5 MG tablet Take 2.5 mg by mouth daily.     albuterol (VENTOLIN HFA) 108 (90 Base) MCG/ACT inhaler      amLODipine (NORVASC) 10 MG tablet TAKE ONE TABLET BY MOUTH ONCE DAILY 90 tablet 1   aspirin EC 81 MG tablet Take 81 mg by mouth daily.     Calcium Carb-Cholecalciferol (CALCIUM + D3) 600-800 MG-UNIT TABS Take 1 tablet by mouth 2 (two) times daily.     cyclobenzaprine (FLEXERIL) 10 MG tablet Take 10 mg by mouth at bedtime as needed for muscle spasms.     diclofenac Sodium (VOLTAREN) 1 % GEL daily as needed.     ezetimibe (ZETIA) 10 MG tablet Take 10 mg by mouth daily.     fluticasone (FLONASE) 50 MCG/ACT nasal spray USE 1 TO 2  SPRAY(S) IN EACH NOSTRIL ONCE DAILY AS NEEDED FOR 30 DAYS     gabapentin (NEURONTIN) 100 MG capsule Take 2 capsules (200 mg total) by mouth 3 (three) times daily. For control of nerve pain at site of surgery. 60 capsule 1   Lancets (ONETOUCH DELICA PLUS LANCET33G) MISC 1 each by Other route 2 (two) times daily.     levothyroxine (SYNTHROID) 100 MCG tablet Take 100 mcg by mouth daily before breakfast.     lubiprostone (AMITIZA) 24 MCG capsule Take 24 mcg by mouth daily.     meloxicam (MOBIC) 7.5 MG tablet Take 7.5 mg by mouth daily as needed for pain.     Multiple Vitamin (MULTIVITAMIN WITH MINERALS) TABS tablet Take 1 tablet by mouth every morning. Centrum     ondansetron (ZOFRAN) 4 MG tablet Take 4 mg by mouth every 8  (eight) hours as needed.     ONETOUCH VERIO test strip 1 each by Other route 2 (two) times daily.     pantoprazole (PROTONIX) 40 MG tablet Take 1 tablet (40 mg total) by mouth daily before breakfast. 60 tablet 1   polyethylene glycol (MIRALAX) 17 g packet Take 17 g by mouth daily. 14 each 0   Propylene Glycol 0.6 % SOLN Place 1 drop into both eyes every 4 (four) hours as needed (dry eyes).     senna-docusate (SENOKOT-S) 8.6-50 MG tablet Take 1 tablet by mouth daily. 14 tablet 0   traMADol (ULTRAM) 50 MG tablet Take 1 tablet (50 mg total) by mouth every 6 (six) hours as needed. 25 tablet 0   Vitamin D, Ergocalciferol, (DRISDOL) 1.25 MG (50000 UNIT) CAPS capsule Take 50,000 Units by mouth once a week.     Current Facility-Administered Medications  Medication Dose Route Frequency Provider Last Rate Last Admin   0.9 %  sodium chloride infusion  500 mL Intravenous Once Pyrtle, Carie Caddy, MD       Facility-Administered Medications Ordered in Other Visits  Medication Dose Route Frequency Provider Last Rate Last Admin   tranexamic acid (CYKLOKAPRON) 2,000 mg in sodium chloride 0.9 % 50 mL Topical Application  2,000 mg Topical Once [place]field, Amber, PA-C        SURGICAL HISTORY:  Past Surgical History:  Procedure Laterality Date   ABDOMINAL HYSTERECTOMY  1977   "partial"   BALLOON ANGIOPLASTY, ARTERY Right 03/21/2014   SFA   CARDIAC CATHETERIZATION  2011    CARPAL TUNNEL RELEASE Right 2005   CATARACT EXTRACTION Left 2013   CORONARY ARTERY BYPASS GRAFT  10/2009   LIMA to the LAD, saphenous vein graft to the acute marginal, saphenous vein graft to the diagonal and obtuse marginal.   DILATION AND CURETTAGE OF UTERUS  1978   INTERCOSTAL NERVE BLOCK Right 12/11/2021   Procedure: INTERCOSTAL NERVE BLOCK;  Surgeon: Loreli Slot, MD;  Location: [facility] OR;  Service: Thoracic;  Laterality: Right;   JOINT REPLACEMENT     LOWER EXTREMITY ANGIOGRAM N/A 03/21/2014   Procedure: LOWER EXTREMITY ANGIOGRAM;   Surgeon: Runell Gess, MD;  Location: [facility] CATH LAB;  Service: Cardiovascular;  Laterality: N/A;   LOWER EXTREMITY ANGIOGRAM  06/07/2016   Abdominal aortogram/bilateral iliac angiogram/bifemoral runoff   LOWER EXTREMITY ANGIOGRAPHY N/A 02/06/2018   Procedure: LOWER EXTREMITY ANGIOGRAPHY;  Surgeon: Runell Gess, MD;  Location: [facility] INVASIVE CV LAB;  Service: Cardiovascular;  Laterality: N/A;   LYMPH NODE DISSECTION  12/11/2021   Procedure: LYMPH NODE DISSECTION;  Surgeon: Loreli Slot, MD;  Location: [facility]  OR;  Service: Thoracic;;   PERIPHERAL VASCULAR ATHERECTOMY  02/06/2018   Procedure: PERIPHERAL VASCULAR ATHERECTOMY;  Surgeon: Runell Gess, MD;  Location: [facility] INVASIVE CV LAB;  Service: Cardiovascular;;   PERIPHERAL VASCULAR CATHETERIZATION Bilateral 06/07/2016   Procedure: Lower Extremity Angiography;  Surgeon: Runell Gess, MD;  Location: [facility] INVASIVE CV LAB;  Service: Cardiovascular;  Laterality: Bilateral;   PERIPHERAL VASCULAR CATHETERIZATION Left 06/07/2016   Procedure: Peripheral Vascular Atherectomy;  Surgeon: Runell Gess, MD;  Location: [facility] INVASIVE CV LAB;  Service: Cardiovascular;  Laterality: Left;  SFA   PERIPHERAL VASCULAR CATHETERIZATION Left 06/07/2016   Procedure: Peripheral Vascular Balloon Angioplasty;  Surgeon: Runell Gess, MD;  Location: [facility] INVASIVE CV LAB;  Service: Cardiovascular;  Laterality: Left;  SFA   THYROIDECTOMY  1995   TONSILLECTOMY     TOTAL KNEE ARTHROPLASTY Right 06/18/2014   Procedure: RIGHT TOTAL KNEE ARTHROPLASTY;  Surgeon: Jacki Cones, MD;  Location: [facility] ORS;  Service: Orthopedics;  Laterality: Right;   XI ROBOTIC ASSISTED THORACOSCOPY- SEGMENTECTOMY Right 12/11/2021   Procedure: XI ROBOTIC ASSISTED THORACOSCOPY-RIGHT LOWER LOBE SUPERIOR SEGMENT, RIGHT UPPER LOBE WEDGE;  Surgeon: Loreli Slot, MD;  Location: [facility] OR;  Service: Thoracic;  Laterality: Right;    REVIEW OF SYSTEMS:   Review of Systems  Constitutional:  Positive for fatigue. Negative for appetite change, chills, fever and unexpected weight change.  HENT: Negative for mouth sores, nosebleeds, sore throat and trouble swallowing.   Eyes: Negative for eye problems and icterus.  Respiratory: Positive for intermittent dry cough. Negative for hemoptysis, shortness of breath and wheezing.   Cardiovascular: Negative for chest pain and leg swelling.  Gastrointestinal: Negative for abdominal pain, constipation, diarrhea, nausea and vomiting.  Genitourinary: Negative for bladder incontinence, difficulty urinating, dysuria, frequency and hematuria.   Musculoskeletal: Negative for back pain, gait problem, neck pain and neck stiffness.  Skin: Negative for itching and rash.  Neurological: Negative for dizziness, extremity weakness, gait problem, headaches, light-headedness and seizures.  Hematological: Negative for adenopathy. Does not bruise/bleed easily.  Psychiatric/Behavioral: Negative for confusion, depression and sleep disturbance. The patient is not nervous/anxious.     PHYSICAL EXAMINATION:  Blood pressure 134/70, pulse 75, temperature (!) 97.4 F (36.3 C), temperature source Oral, weight 128 lb 6.4 oz (58.2 kg), SpO2 96 %.  ECOG PERFORMANCE STATUS: 1  Physical Exam  Constitutional: Oriented to person, place, and time and well-developed, well-nourished, and in no distress. HENT:  Head: Normocephalic and atraumatic.  Mouth/Throat: Oropharynx is clear and moist. No oropharyngeal exudate.  Eyes: Conjunctivae are normal. Right eye exhibits no discharge. Left eye exhibits no discharge. No scleral icterus.  Neck: Normal range of motion. Neck supple.  Cardiovascular: Normal rate, regular rhythm, normal heart sounds and intact distal pulses.   Pulmonary/Chest: Effort normal and breath sounds normal. No respiratory distress. No wheezes. No rales.  Abdominal: Soft. Bowel sounds are normal. Exhibits no distension and no mass. There is no tenderness.   Musculoskeletal: Normal range of motion. Exhibits no edema.  Lymphadenopathy:    No cervical adenopathy.  Neurological: Alert and oriented to person, place, and time. Exhibits normal muscle tone. Gait normal. Coordination normal.  Skin: Skin is warm and dry. No rash noted. Not diaphoretic. No erythema. No pallor.  Psychiatric: Mood, memory and judgment normal.  Vitals reviewed.  LABORATORY DATA: Lab Results  Component Value Date   WBC 6.3 01/05/2023   HGB 13.9 01/05/2023   HCT 40.7 01/05/2023   MCV 87.5 01/05/2023   PLT 260  01/05/2023      Chemistry      Component Value Date/Time   NA 140 01/05/2023 1536   NA 142 07/07/2021 1008   K 3.6 01/05/2023 1536   CL 107 01/05/2023 1536   CO2 25 01/05/2023 1536   BUN 13 01/05/2023 1536   BUN 9 07/07/2021 1008   CREATININE 0.69 01/05/2023 1536   CREATININE 0.71 05/25/2016 1257      Component Value Date/Time   CALCIUM 9.6 01/05/2023 1536   ALKPHOS 97 01/05/2023 1536   AST 13 (L) 01/05/2023 1536   ALT 10 01/05/2023 1536   BILITOT 0.4 01/05/2023 1536       RADIOGRAPHIC STUDIES:  CT Chest W Contrast  Result Date: 01/11/2023 CLINICAL DATA:  History of multifocal lung adenocarcinoma status post right upper lobe wedge resection and right lower lobe superior segmentectomy on 12/11/2021 on surveillance EXAM: CT CHEST WITH CONTRAST TECHNIQUE: Multidetector CT imaging of the chest was performed during intravenous contrast administration. RADIATION DOSE REDUCTION: This exam was performed according to the departmental dose-optimization program which includes automated exposure control, adjustment of the mA and/or kV according to patient size and/or use of iterative reconstruction technique. CONTRAST:  75mL OMNIPAQUE IOHEXOL 300 MG/ML  SOLN COMPARISON:  CT chest dated 07/06/2022 and multiple priors dating back to 12/17/2019, CT abdomen and pelvis dated 05/28/2017 FINDINGS: Cardiovascular: Normal heart size. No significant pericardial  fluid/thickening. Great vessels are normal in course and caliber. No central pulmonary emboli. Coronary artery calcifications and aortic atherosclerosis. Mediastinum/Nodes: Imaged thyroid gland without nodules meeting criteria for imaging follow-up by size. Mildly patulous, air-filled esophagus. No pathologically enlarged axillary, supraclavicular, mediastinal, or hilar lymph nodes. Lungs/Pleura: The central airways are patent. Apical predominant centrilobular and paraseptal emphysema. Postsurgical changes of the right lung with scarring and architectural distortion as well as architectural distortion and subpleural consolidation along the posteromedial bilateral apices. Medial left apical nodule measuring 8 x 6 mm (8:33) is unchanged dating back to 10/01/2021. Subtle, irregular ground-glass nodule in the subpleural right lower lobe measures 6 x 5 mm (8:114), unchanged dating back to 11/08/2017, demonstrating 5 year stability. Subpleural right lower lobe 6 x 5 mm nodule (8:101) and right upper lobe 4 mm nodule (8:60) are unchanged dating back to 12/17/2019, likely benign. No pneumothorax. No pleural effusion. Upper abdomen: Unchanged hepatic hypodensities dating back to 05/28/2017, likely benign. Unchanged 1.1 cm right adrenal adenoma. No specific follow-up imaging recommended. No discrete left adrenal nodule. Musculoskeletal: Median sternotomy wires are nondisplaced. Multilevel degenerative changes of the thoracic spine. Postsurgical changes from right posterior thoracotomy. IMPRESSION: 1. Postsurgical changes of the right lung with scarring and architectural distortion as well as architectural distortion and subpleural consolidation along the posteromedial bilateral apices. 2. Medial left apical nodule measuring 8 x 6 mm is unchanged dating back to 10/01/2021. Recommend continued attention on follow-up. 3. Aortic Atherosclerosis (ICD10-I70.0) and Emphysema (ICD10-J43.9). Coronary artery calcifications. Assessment  for potential risk factor modification, dietary therapy or pharmacologic therapy may be warranted, if clinically indicated. Electronically Signed   By: Agustin Cree M.D.   On: 01/11/2023 09:21     ASSESSMENT/PLAN:   This is a very pleasant 77 year old African-American female diagnosed with multifocal likely stage IV (T1b, N0, M1 a) non-small cell lung cancer, adenocarcinoma presented with multiple bilateral pulmonary nodules status post surgical resection of the 2 prominent pulmonary nodules in the right upper lobe as well as the right lower lobe with wedge resection of the right upper lobe nodule and superior segmentectomy  of the right lower lobe on December 11, 2021 under the care of Dr. Dorris Fetch. The patient had molecular studies by foundation 1 that showed no actionable mutations and PD-L1 expression was negative.  She is on observation and feeling well.   She had a restaging CT scan performed. The patient was seen with Dr. Arbutus Ped. Dr. Arbutus Ped personally and independently reviewed the scan and discussed the results with the patient. The scan showed no evidence of disease progression. She has a stable nodule which we will continue to monitor closely.   Dr. Arbutus Ped recommends she continue on observation with a repeat CT scan in 6 months. We will see her back in the office a few days after the CT scan to review the results.   For her intermittent cough, this is mild. No evidence for infection/pneumonia. Recommend she use OTC delsym if needed. She also takes allergy medication if needed.   The patient was advised to call immediately if she has any concerning symptoms in the interval. The patient voices understanding of current disease status and treatment options and is in agreement with the current care plan. All questions were answered. The patient knows to call the clinic with any problems, questions or concerns. We can certainly see the patient much sooner if necessary      Orders Placed  This Encounter  Procedures   CT Chest W Contrast    Standing Status:   Future    Standing Expiration Date:   01/20/2024    Order Specific Question:   If indicated for the ordered procedure, I authorize the administration of contrast media per Radiology protocol    Answer:   Yes    Order Specific Question:   Does the patient have a contrast media/X-ray dye allergy?    Answer:   No    Order Specific Question:   Preferred imaging location?    Answer:   [facility]   CBC with Differential (Cancer Center Only)    Standing Status:   Future    Standing Expiration Date:   01/20/2024   CMP (Cancer Center only)    Standing Status:   Future    Standing Expiration Date:   01/20/2024      Johnette Abraham Bertha Lokken, PA-C 01/20/23  ADDENDUM: Hematology/Oncology Attending: I had a face-to-face encounter with the patient today.  I reviewed her record, lab, scan and recommended her care plan.  This is a very pleasant 77 years old African-American female with multifocal adenocarcinoma presented with multiple bilateral pulmonary nodules diagnosed in April 2023 with no actionable mutations and negative PD-L1 expression.  She is status post surgical resection of the 2 prominent pulmonary nodules in the right upper lobe and right lower lobe and February 2023.  The patient has been on observation since that time.  She is here today for evaluation with repeat CT scan of the chest for restaging of her disease. I personally and independently reviewed the scan and discussed the result with the patient today. Her scan showed no concerning findings for disease progression and she continues to have unchanged medial left apical pulmonary nodule. I recommended for her to continue on observation with repeat CT scan of the chest in 6 months. The patient was advised to call immediately if she has any other concerning symptoms in the interval.  Disclaimer: This note was dictated with voice recognition software.  Similar sounding words can inadvertently be transcribed and may be missed upon review. Lajuana Matte, MD

## 2023-01-20 ENCOUNTER — Inpatient Hospital Stay: Payer: Medicare Other | Admitting: Physician Assistant

## 2023-01-20 VITALS — BP 134/70 | HR 75 | Temp 97.4°F | Wt 128.4 lb

## 2023-01-20 DIAGNOSIS — Z08 Encounter for follow-up examination after completed treatment for malignant neoplasm: Secondary | ICD-10-CM | POA: Diagnosis not present

## 2023-01-20 DIAGNOSIS — C3491 Malignant neoplasm of unspecified part of right bronchus or lung: Secondary | ICD-10-CM

## 2023-01-24 ENCOUNTER — Ambulatory Visit: Payer: Medicare Other | Admitting: Nurse Practitioner

## 2023-02-22 ENCOUNTER — Ambulatory Visit: Payer: Medicare Other | Admitting: Podiatry

## 2023-04-07 ENCOUNTER — Ambulatory Visit: Payer: Medicare Other | Admitting: Nurse Practitioner

## 2023-04-29 ENCOUNTER — Other Ambulatory Visit: Payer: Self-pay | Admitting: Student

## 2023-04-29 DIAGNOSIS — R109 Unspecified abdominal pain: Secondary | ICD-10-CM

## 2023-05-10 ENCOUNTER — Ambulatory Visit (INDEPENDENT_AMBULATORY_CARE_PROVIDER_SITE_OTHER): Payer: Medicare Other | Admitting: Podiatry

## 2023-05-10 ENCOUNTER — Encounter: Payer: Self-pay | Admitting: Podiatry

## 2023-05-10 DIAGNOSIS — M79674 Pain in right toe(s): Secondary | ICD-10-CM | POA: Diagnosis not present

## 2023-05-10 DIAGNOSIS — M79675 Pain in left toe(s): Secondary | ICD-10-CM | POA: Diagnosis not present

## 2023-05-10 DIAGNOSIS — E1151 Type 2 diabetes mellitus with diabetic peripheral angiopathy without gangrene: Secondary | ICD-10-CM | POA: Diagnosis not present

## 2023-05-10 DIAGNOSIS — B351 Tinea unguium: Secondary | ICD-10-CM | POA: Diagnosis not present

## 2023-05-15 NOTE — Progress Notes (Signed)
Subjective:  Patient ID: [name], female    DOB: [birth year],  MRN: [id number]  [name] presents to clinic today for at risk foot care. Pt has h/o NIDDM with PAD and painful elongated mycotic toenails 1-5 bilaterally which are tender when wearing enclosed shoe gear. Pain is relieved with periodic professional debridement.  Chief Complaint  Patient presents with   Diabetes    DFC BS - 112  A1C - DK   New problem(s): None.   PCP is Hillery Aldo, NP.  Allergies  Allergen Reactions   Lipitor [Atorvastatin] Other (See Comments)    Muscle aches    Review of Systems: Negative except as noted in the HPI.  Objective: No changes noted in today's physical examination. There were no vitals filed for this visit. [name] is a pleasant 77 y.o. female WD, WN in NAD. AAO x 3.  Vascular Examination: CFT <4 seconds b/l. DP pulses diminished b/l. PT pulses diminished b/l. Digital hair absent. Skin temperature gradient warm to cool b/l. No ischemia or gangrene. No cyanosis or clubbing noted b/l. No edema noted b/l LE.   Neurological Examination: Sensation grossly intact b/l with 10 gram monofilament. Vibratory sensation intact b/l.   Dermatological Examination: Pedal skin thin, shiny and atrophic b/l. No open wounds. No interdigital macerations.   Toenails 1-5 b/l thick, discolored, elongated with subungual debris and pain on dorsal palpation.   Musculoskeletal Examination: Muscle strength 5/5 to b/l LE. Hammertoe deformity noted 2-5 b/l.  Radiographs: None  Assessment/Plan: 1. Pain due to onychomycosis of toenails of both feet   2. Type II diabetes mellitus with peripheral circulatory disorder (HCC)     -Consent given for treatment as described below: -Examined patient. -Continue supportive shoe gear daily. -Mycotic toenails 1-5 bilaterally were debrided in length and girth with sterile nail nippers and dremel without incident. -Patient/POA to call should  there be question/concern in the interim.   No follow-ups on file.  Freddie Breech, DPM

## 2023-05-17 ENCOUNTER — Ambulatory Visit
Admission: RE | Admit: 2023-05-17 | Discharge: 2023-05-17 | Disposition: A | Discharge status: Home / Self Care | Payer: Medicare Other | Source: Ambulatory Visit | Attending: Student | Admitting: Student

## 2023-05-17 DIAGNOSIS — R109 Unspecified abdominal pain: Secondary | ICD-10-CM

## 2023-05-17 MED ORDER — IOPAMIDOL (ISOVUE-300) INJECTION 61%
100.0000 mL | Freq: Once | INTRAVENOUS | Status: AC | PRN
  Administered 2023-05-17: 100 mL via INTRAVENOUS

## 2023-07-12 ENCOUNTER — Telehealth: Payer: Self-pay | Admitting: Medical Oncology

## 2023-07-12 NOTE — Telephone Encounter (Signed)
[name] notified that she does not need labs or a  scan before she sees [name]. Appts cancelled.

## 2023-07-12 NOTE — Telephone Encounter (Signed)
Requested labs from PCP 

## 2023-07-25 ENCOUNTER — Ambulatory Visit (HOSPITAL_COMMUNITY): Payer: Medicare Other

## 2023-07-25 ENCOUNTER — Other Ambulatory Visit: Payer: Medicare Other

## 2023-07-26 ENCOUNTER — Telehealth: Payer: Self-pay | Admitting: Medical Oncology

## 2023-07-26 NOTE — Telephone Encounter (Signed)
Ct chest -I LVM for [name] that she needs CT chest -it was NOT done in October when she had CT A/P,. I gave her the number to central scheduling to schedule it. I sent schedule message to schedule labs one  hour prior to scan.

## 2023-07-27 NOTE — Telephone Encounter (Signed)
Spoke with patient in regards to CT scan.  Pt stated that she already had her CT scan at [facility] imaging and insurance will not pay for another one.  Pt received CT of the abd/pelvis and not chest.  LVM with patient to get chest CT scheduled. Asked for a return call.

## 2023-07-28 NOTE — Progress Notes (Signed)
Cardiology Office Note:  .   Date:  07/29/2023  ID:  [name], DOB 1946/01/21, MRN [id number] PCP: Hillery Aldo, NP  [facility] Providers Cardiologist: Dr. Jens Som  PV Cardiologist:  Nanetta Batty, MD  }   History of Present Illness: .   [name] is a 77 y.o. female with history of ischemic heart disease, status post CABG in March 2023, LIMA to LAD, vein graft to diagonal branch and obtuse marginal branch and acute marginal branch.  Other history includes hypertension hyperlipidemia.    She was seen by Dr. Allyson Sabal for lower extremity claudication symptoms on 09/03/2022 Doppler studies revealed right ABI of 0.43 with occluded distal right FSA and popliteal artery and left ABI 0.73 with high-frequency signal in the mid left SFA.  Dr. Allyson Sabal performed PTA using drug-eluting balloon with excellent angiographic result to the occluded left FSA.  The patient also underwent angiography in 2017 revealing 80% segmental mid left SFA stenosis to which Dr. Allyson Sabal performed a [facility] 1 directional atherectomy followed by drug-eluting balloon angioplasty.  She again saw Dr. Allyson Sabal in 2019 where he performed directional atherectomy followed by drug-eluting balloon angioplasty of the distal SFA occlusion and chocolate balloon angioplasty of the distal right common femoral artery.  She was seen last on 09/03/2022 and was stable and without any claudication symptoms.  Today she comes with complaints of worsening left lower leg pain and claudication symptoms.  She states that it hurts to walk on her left leg, often keeps it elevated and has been using Voltaren gel to help with the pain.  She denies any swelling, coldness, or spasming.  She is due to have follow-up ABIs on January 3.  She states that she takes Tylenol for the pain.    She denies chest pain, dyspnea on exertion, she is not very active due to her legs and states that she is turned into a "couch potato" because she just does not feel  strong enough to exercise and move around with her leg pain.  She has been medically compliant.  She denies palpitations or dizziness.  ROS: As above otherwise negative.  Studies Reviewed: Marland Kitchen   EKG Interpretation Date/Time:  Friday July 29 2023 09:12:08 EST Ventricular Rate:  69 PR Interval:  182 QRS Duration:  82 QT Interval:  396 QTC Calculation: 424 R Axis:   -14  Text Interpretation: Normal sinus rhythm Anterior infarct (cited on or before 14-May-2018) When compared with ECG of 09-Dec-2021 11:44, Nonspecific T wave abnormality, improved in Inferior leads Confirmed by Joni Reining [phone]) on 07/29/2023 9:27:06 AM     ABI 08/25/2022 Summary:  Right: Resting right ankle-brachial index indicates mild right lower  extremity arterial disease. The right toe-brachial index is normal. 0.84  Left: Resting left ankle-brachial index indicates moderate left lower  extremity arterial disease. The left toe-brachial index is abnormal. 0.68  Physical Exam:   VS:  BP 122/68   Pulse 69   Ht 5\' 3"  (1.6 m)   Wt 126 lb (57.2 kg)   SpO2 97%   BMI 22.32 kg/m    Wt Readings from Last 3 Encounters:  07/29/23 126 lb (57.2 kg)  01/20/23 128 lb 6.4 oz (58.2 kg)  11/29/22 132 lb (59.9 kg)    GEN: Well nourished, well developed in no acute distress NECK: No JVD; left carotid bruits, none on the right CARDIAC: RRR, no murmurs, rubs, gallops RESPIRATORY:  Clear to auscultation without rales, wheezing or rhonchi  ABDOMEN: Soft, non-tender,  non-distended EXTREMITIES:  No deformity pulses are diminished bilaterally.  No coldness, skin discoloration, or edema  ASSESSMENT AND PLAN: .    Coronary artery disease: History of CABG in 2023.  She denies any chest pain palpitations dyspnea on exertion.  She has fatigue in general as she has not been very active due to chronic lower extremity pain.  She occasionally has GERD symptoms.  She would like to exercise more and be more active but her legs bother  her too much and therefore she is not been very active at all.  Continue secondary prevention with blood pressure control, statin therapy, low-cholesterol diet, I would like to see her get back to [facility] to [facility] once her legs have been evaluated further and intervention if necessary per Dr. Allyson Sabal.   2.  PAD: Extensive work per Dr. Allyson Sabal, in 2017, 2019 of lower extremities with SFA stenosis with atherectomy and drug-eluting balloon angioplasty.  She is due to have follow-up ABIs January 3 and is to see Dr. Allyson Sabal on January 6.  She is having worsening complaints of claudication symptoms in the lower left leg.  Most recent ABIs in January 2024 were abnormal with ABI of 0.6 8 on the left, and 0.84 on the right.  3.  Hypertension: Blood pressure is controlled today.  She denies any dizziness associated with position change.  She is tolerating amlodipine and lisinopril.  Labs are followed by primary care at [facility].  4.  Hypercholesterolemia: Goal of LDL less than 70.  She is currently on Zetia only.  Labs are followed by [facility] clinic.  She is intolerant of atorvastatin.         Signed, Bettey Mare. Liborio Nixon, ANP, AACC

## 2023-07-29 ENCOUNTER — Ambulatory Visit: Payer: Medicare Other | Attending: Adult Health | Admitting: Adult Health

## 2023-07-29 ENCOUNTER — Encounter: Payer: Self-pay | Admitting: Adult Health

## 2023-07-29 VITALS — BP 122/68 | HR 69 | Ht 63.0 in | Wt 126.0 lb

## 2023-07-29 DIAGNOSIS — I10 Essential (primary) hypertension: Secondary | ICD-10-CM

## 2023-07-29 DIAGNOSIS — I2510 Atherosclerotic heart disease of native coronary artery without angina pectoris: Secondary | ICD-10-CM | POA: Diagnosis not present

## 2023-07-29 DIAGNOSIS — I739 Peripheral vascular disease, unspecified: Secondary | ICD-10-CM | POA: Diagnosis not present

## 2023-07-29 DIAGNOSIS — E7800 Pure hypercholesterolemia, unspecified: Secondary | ICD-10-CM | POA: Diagnosis not present

## 2023-07-29 NOTE — Patient Instructions (Signed)
Medication Instructions:  No changes *If you need a refill on your cardiac medications before your next appointment, please call your pharmacy*   Lab Work: No Labs If you have labs (blood work) drawn today and your tests are completely normal, you will receive your results only by: MyChart Message (if you have MyChart) OR A paper copy in the mail If you have any lab test that is abnormal or we need to change your treatment, we will call you to review the results.   Testing/Procedures: No Testing   Follow-Up: At [facility], you and your health needs are our priority.  As part of our continuing mission to provide you with exceptional heart care, we have created designated Provider Care Teams.  These Care Teams include your primary Cardiologist (physician) and Advanced Practice Providers (APPs -  Physician Assistants and Nurse Practitioners) who all work together to provide you with the care you need, when you need it.  We recommend signing up for the patient portal called "MyChart".  Sign up information is provided on this After Visit Summary.  MyChart is used to connect with patients for Virtual Visits (Telemedicine).  Patients are able to view lab/test results, encounter notes, upcoming appointments, etc.  Non-urgent messages can be sent to your provider as well.   To learn more about what you can do with MyChart, go to [email or web address].    Your next appointment:   Keep Scheduled Appointment  Provider:   Nanetta Batty, MD

## 2023-08-01 ENCOUNTER — Ambulatory Visit: Payer: Medicare Other | Admitting: Internal Medicine

## 2023-08-05 ENCOUNTER — Telehealth: Payer: Self-pay | Admitting: Medical Oncology

## 2023-08-05 ENCOUNTER — Encounter: Payer: Self-pay | Admitting: Medical Oncology

## 2023-08-05 NOTE — Telephone Encounter (Signed)
LVM that she still needs CT of chest . I told her the CT A/P in October was ordered by another provider for your abdominal pain and nausea. The CT of chest was not done.

## 2023-08-05 NOTE — Telephone Encounter (Signed)
Pt returned my call and wants to have her appts for lab, CT and Dr. Arbutus Ped in January . Schedule message sent.    She understood the need for a CT scan of the chest.

## 2023-08-08 ENCOUNTER — Encounter: Payer: Self-pay | Admitting: Student

## 2023-08-09 ENCOUNTER — Ambulatory Visit (INDEPENDENT_AMBULATORY_CARE_PROVIDER_SITE_OTHER): Payer: Medicare Other | Admitting: Podiatry

## 2023-08-09 DIAGNOSIS — M79675 Pain in left toe(s): Secondary | ICD-10-CM

## 2023-08-09 DIAGNOSIS — M79674 Pain in right toe(s): Secondary | ICD-10-CM | POA: Diagnosis not present

## 2023-08-09 DIAGNOSIS — L84 Corns and callosities: Secondary | ICD-10-CM

## 2023-08-09 DIAGNOSIS — B351 Tinea unguium: Secondary | ICD-10-CM

## 2023-08-09 DIAGNOSIS — E1151 Type 2 diabetes mellitus with diabetic peripheral angiopathy without gangrene: Secondary | ICD-10-CM | POA: Diagnosis not present

## 2023-08-09 NOTE — Progress Notes (Unsigned)
Subjective:  Patient ID: [name], female    DOB: [birth year],  MRN: [id number]  [name] presents to clinic today for:  Chief Complaint  Patient presents with   Diabetes    Patient is here for routine [facility]   Patient notes nails are thick and elongated, causing pain in shoe gear when ambulating.  She also has painful corns and calluses.  PCP is Hillery Aldo, NP.  Last seen around 05/04/23  Past Medical History:  Diagnosis Date   Anemia    Anxiety    hx   Arthritis    "my whole body"   Atrial septal aneurysm    COLONIC POLYPS, HX OF    COPD (chronic obstructive pulmonary disease) (HCC)    no per pt on 03/21/2014 & 02/06/2018   Coronary artery disease    a. s/p CABG 2012.   DISC DISEASE, CERVICAL    Gastroesophageal reflux disease    Headache    "I have headaches all the time" (02/06/2018)   Heart murmur    as a child   History of rheumatic fever    Hyperlipidemia    Hypertension    Hypothyroidism    Insomnia    Osteoarthritis    OSTEOPENIA    PAD (peripheral artery disease) (HCC)    a. PTA to R SFA 2015. b. PTA to L SFA 05/2016.   Plantar fasciitis of left foot 10/02/2019   Pre-diabetes    RSD (reflex sympathetic dystrophy) 10/31/2011   Vitamin D deficiency     Allergies  Allergen Reactions   Lipitor [Atorvastatin] Other (See Comments)    Muscle aches   Objective:  TYRIEL KARGBO is a pleasant 77 y.o. female in NAD. AAO x 3.  Vascular Examination: Patient has palpable DP pulse, absent PT pulse bilateral.  Delayed capillary refill bilateral toes.  Sparse digital hair bilateral.  Proximal to distal cooling WNL bilateral.    Dermatological Examination: Interspaces are clear with no open lesions noted bilateral.  Skin is shiny and atrophic bilateral.  Nails are 3-8mm thick, with yellowish/brown discoloration, subungual debris and distal onycholysis x10.  There is pain with compression of nails x10.  There are hyperkeratotic lesions noted on  the left bunion and right dorsal 4th toe PIPJ .  Patient qualifies for at-risk foot care because of diabetes with PVD .  Assessment/Plan: 1. Pain due to onychomycosis of toenails of both feet   2. Corns and callosities   3. Type II diabetes mellitus with peripheral circulatory disorder (HCC)    Mycotic nails x10 were sharply debrided with sterile nail nippers and power debriding burr to decrease bulk and length.  Hyperkeratotic lesions x2 were shaved with #312 blade.  Patient states that Dr. Dolores Patty always gives her a bunch of gel toe caps for several of her toes at every visit and was expecting them to be given to her today.  She was handed a couple, but really needs to purchase these at checkout or online, as they are expensive products for the practice.  F/u 3 months   Elven Laboy DBurna Mortimer, DPM, FACFAS [facility]     [address], [place] [address]  Office [phone]  Fax [phone]

## 2023-08-26 ENCOUNTER — Ambulatory Visit (HOSPITAL_COMMUNITY)
Admission: RE | Admit: 2023-08-26 | Discharge: 2023-08-26 | Disposition: A | Discharge status: Home / Self Care | Payer: Medicare (Managed Care) | Source: Ambulatory Visit | Attending: Cardiovascular Disease | Admitting: Cardiovascular Disease

## 2023-08-26 ENCOUNTER — Ambulatory Visit (HOSPITAL_BASED_OUTPATIENT_CLINIC_OR_DEPARTMENT_OTHER)
Admission: RE | Admit: 2023-08-26 | Discharge: 2023-08-26 | Disposition: A | Discharge status: Home / Self Care | Payer: Medicare (Managed Care) | Source: Ambulatory Visit | Attending: Cardiovascular Disease | Admitting: Cardiovascular Disease

## 2023-08-26 DIAGNOSIS — I739 Peripheral vascular disease, unspecified: Secondary | ICD-10-CM | POA: Diagnosis present

## 2023-08-26 LAB — VAS US ABI WITH/WO TBI
Left ABI: 0.65
Right ABI: 0.86

## 2023-08-29 ENCOUNTER — Ambulatory Visit: Payer: Self-pay | Admitting: Cardiovascular Disease

## 2023-08-30 ENCOUNTER — Ambulatory Visit: Payer: Medicare (Managed Care) | Attending: Cardiovascular Disease | Admitting: Cardiovascular Disease

## 2023-08-30 ENCOUNTER — Ambulatory Visit: Payer: Medicare (Managed Care) | Admitting: Cardiology

## 2023-08-30 ENCOUNTER — Encounter: Payer: Self-pay | Admitting: Cardiovascular Disease

## 2023-08-30 VITALS — BP 126/60 | HR 84 | Ht 63.0 in | Wt 129.0 lb

## 2023-08-30 DIAGNOSIS — I6522 Occlusion and stenosis of left carotid artery: Secondary | ICD-10-CM | POA: Diagnosis not present

## 2023-08-30 DIAGNOSIS — I739 Peripheral vascular disease, unspecified: Secondary | ICD-10-CM

## 2023-08-30 MED ORDER — CILOSTAZOL 50 MG PO TABS: 50.0000 mg | ORAL_TABLET | Freq: Two times a day (BID) | ORAL | 1 refills | Status: DC

## 2023-08-30 NOTE — Assessment & Plan Note (Signed)
 History of moderate left ICA stenosis by duplex ultrasound 05/12/2022.  These will be repeated.

## 2023-08-30 NOTE — Progress Notes (Signed)
 08/30/2023 [name]   11-29-1945  [id number]  Primary Physician Campbell Reynolds, NP Primary Cardiologist: Dorn JINNY Lesches MD GENI CODY [place], [place]  HPI:  [name] is a 78 y.o.  African American female patient of Dr. Vertie referred for peripheral vascular evaluation. She was seen by Dr. Heide  orthopedic surgeon, who referred her here. I last saw her in the office 09/03/2022. Her past history is remarkable for ischemic heart disease status post coronary bypass grafting in March 2012 of the LIMA to LAD, vein to the diagonal branch, obtuse marginal branch and a vein to acute marginal branch. She has normal LV function by 2-D echo. Further problems include hypertension, and hyperlipidemia. She complains of right lower extremity claudication and had seen Dr. Darron in the past who thought that her pain was arthritic in nature. Lower extremity arterial Doppler studies performed 07/07/14 revealed a right ABI 0.43 with an occluded distal right SFA and popliteal artery, left ABI 0.73 with a high-frequency signal in the mid left SFA. I performed angiography on her 753/15 revealing an occluded left SFA with one vessel runoff via the peroneal. I performed [employer]bo[employer]awk directional atherectomy followed by PTA using drug-eluting balloon with excellent angiographic result. Her followup Doppler revealed an increase in her right ABI from 0.4 to .7 with resolution of her claudication symptoms. Since I saw her over 2 years ago she developed claudication in her left leg now. I suspected that her previously  documented lesions in her left external leg artery and SFA have progressed. She underwent angiography by myself 06/07/16 revealing 80% segmental mid left SFA stenosis. I performed [facility] 1 directional atherectomy followed by drug-eluting balloon into plasty. She will vessel runoff via peroneal. She did have 50% distal left common iliac artery stenosis without a gradient. Her follow-up lower extremity  Doppler studies performed 06/24/16 were markedly improved ABI 1.04.   Because of right lower extremity claudication and markedly reduced right ABI of 0.46 she underwent angiography by myself on 02/06/2018 really 90% distal right common femoral artery stenosis, total occlusion in the distal right SFA with one-vessel runoff via peroneal.  I performed directional atherectomy followed by drug-eluting balloon angioplasty of the distal SFA occlusion and chocolate balloon angioplasty of the distal right common femoral artery.  She had follow-up Doppler studies performed in our office 08/25/2017 revealed marked improvement with a right ABI of 0.82.  Her claudication has  markedly improved.   Since I saw her in the office in the office a year ago she has developed progressive left calf claudication which is lifestyle limiting.  When I last angiogram to her in 2019 her left SFA was widely patent and she had one-vessel runoff via peroneal artery.  Her most recent Doppler studies performed 08/26/2023 revealed a right ABI of 0.86 with a patent right SFA and a left ABI of 0.63 with an occluded left SFA which explains her progressive symptoms.  She otherwise denies chest pain or shortness of breath.   Current Meds  Medication Sig   albuterol  (VENTOLIN  HFA) 108 (90 Base) MCG/ACT inhaler    amLODipine  (NORVASC ) 10 MG tablet TAKE ONE TABLET BY MOUTH ONCE DAILY   aspirin  EC 81 MG tablet Take 81 mg by mouth daily.   Calcium  Carb-Cholecalciferol  (CALCIUM  + D3) 600-800 MG-UNIT TABS Take 1 tablet by mouth 2 (two) times daily.   cilostazol  (PLETAL ) 50 MG tablet Take 1 tablet (50 mg total) by mouth 2 (two) times daily.   cyclobenzaprine  (  FLEXERIL ) 10 MG tablet Take 10 mg by mouth at bedtime as needed for muscle spasms.   diclofenac  Sodium (VOLTAREN ) 1 % GEL daily as needed.   ezetimibe  (ZETIA ) 10 MG tablet Take 10 mg by mouth daily.   fluticasone  (FLONASE ) 50 MCG/ACT nasal spray USE 1 TO 2 SPRAY(S) IN EACH NOSTRIL ONCE DAILY AS  NEEDED FOR 30 DAYS   gabapentin  (NEURONTIN ) 100 MG capsule Take 2 capsules (200 mg total) by mouth 3 (three) times daily. For control of nerve pain at site of surgery.   Lancets (ONETOUCH DELICA PLUS LANCET33G) MISC 1 each by Other route 2 (two) times daily.   levothyroxine  (SYNTHROID ) 100 MCG tablet Take 100 mcg by mouth daily before breakfast.   lisinopril  (ZESTRIL ) 2.5 MG tablet Take 2.5 mg by mouth daily.   lubiprostone (AMITIZA) 24 MCG capsule Take 24 mcg by mouth daily.   meloxicam  (MOBIC ) 7.5 MG tablet Take 7.5 mg by mouth daily as needed for pain.   Multiple Vitamin (MULTIVITAMIN WITH MINERALS) TABS tablet Take 1 tablet by mouth every morning. Centrum   ondansetron  (ZOFRAN ) 4 MG tablet Take 4 mg by mouth every 8 (eight) hours as needed.   ONETOUCH VERIO test strip 1 each by Other route 2 (two) times daily.   pantoprazole  (PROTONIX ) 40 MG tablet Take 1 tablet (40 mg total) by mouth daily before breakfast.   polyethylene glycol (MIRALAX ) 17 g packet Take 17 g by mouth daily.   Propylene Glycol 0.6 % SOLN Place 1 drop into both eyes every 4 (four) hours as needed (dry eyes).   senna-docusate (SENOKOT-S) 8.6-50 MG tablet Take 1 tablet by mouth daily.   traMADol  (ULTRAM ) 50 MG tablet Take 1 tablet (50 mg total) by mouth every 6 (six) hours as needed.   Vitamin D , Ergocalciferol , (DRISDOL ) 1.25 MG (50000 UNIT) CAPS capsule Take 50,000 Units by mouth once a week.   Current Facility-Administered Medications for the 08/30/23 encounter (Office Visit) with Court Dorn PARAS, MD  Medication   0.9 %  sodium chloride  infusion     Allergies  Allergen Reactions   Lipitor [Atorvastatin ] Other (See Comments)    Muscle aches    Social History   Socioeconomic History   Marital status: Divorced    Spouse name: Not on file   Number of children: 2   Years of education: 14   Highest education level: Not on file  Occupational History   Occupation: Retired    Associate Professor: RETIRED   Occupation:  retired  Tobacco Use   Smoking status: Former    Current packs/day: 0.00    Average packs/day: 0.8 packs/day for 45.0 years (36.0 ttl pk-yrs)    Types: Cigarettes    Start date: 08/23/1964    Quit date: 08/23/2009    Years since quitting: 14.0   Smokeless tobacco: Never  Vaping Use   Vaping status: Never Used  Substance and Sexual Activity   Alcohol  use: Not Currently    Alcohol /week: 2.0 standard drinks of alcohol     Types: 2 Glasses of wine per week   Drug use: Never   Sexual activity: Not Currently  Other Topics Concern   Not on file  Social History Narrative   Not on file   Social Drivers of Health   Financial Resource Strain: Not on file  Food Insecurity: Not on file  Transportation Needs: Not on file  Physical Activity: Not on file  Stress: Not on file  Social Connections: Not on file  Intimate Partner Violence:  Not on file     Review of Systems: General: negative for chills, fever, night sweats or weight changes.  Cardiovascular: negative for chest pain, dyspnea on exertion, edema, orthopnea, palpitations, paroxysmal nocturnal dyspnea or shortness of breath Dermatological: negative for rash Respiratory: negative for cough or wheezing Urologic: negative for hematuria Abdominal: negative for nausea, vomiting, diarrhea, bright red blood per rectum, melena, or hematemesis Neurologic: negative for visual changes, syncope, or dizziness All other systems reviewed and are otherwise negative except as noted above.    Blood pressure 126/60, pulse 84, height 5' 3 (1.6 m), weight 129 lb (58.5 kg), SpO2 94%.  General appearance: alert and no distress Neck: no adenopathy, no JVD, supple, symmetrical, trachea midline, thyroid  not enlarged, symmetric, no tenderness/mass/nodules, and soft left carotid [name] Lungs: clear to auscultation bilaterally Heart: regular rate and rhythm, S1, S2 normal, no murmur, click, rub or gallop Extremities: extremities normal, atraumatic, no  cyanosis or edema Pulses: Diminished pedal pulses Skin: Skin color, texture, turgor normal. No rashes or lesions Neurologic: Grossly normal  EKG not performed today      ASSESSMENT AND PLAN:   PVD (peripheral vascular disease) (HCC) [name] returns today for follow-up of her PAD.  She recently saw Lamarr Satterfield, DNP in the office last month complaining of left lower extremity claudication.  I have performed multiple procedures on both legs in the past beginning 07/07/2014 Cervene on her occluded distal right SFA and popliteal artery.  On 02/22/2014 I intervened on her occluded left SFA.  I angiogram to her in 2017 and performed directional atherectomy and drug-coated balloon angioplasty of the mid left SFA.  Her most recent procedure performed 02/06/2018 was notable for intervention on an occluded distal right SFA using directional atherectomy and drug-coated balloon angioplasty as well as her right common femoral artery.  At that time her left SFA was widely patent.  Her Doppler studies performed a year ago revealed a left ABI of 0.68 with a patent left SFA however her most recent Doppler performed 08/26/2023 revealed a decline in her left ABI to 0.63 with an occluded left SFA.  She does have one-vessel runoff via peroneal bilaterally.  At this point she is not sure whether she wants to proceed with invasive evaluation and treatment strategy but wishes to pursue pharmacologic therapy.  Will start her on Pletal  50 mg p.o. twice daily and I will see her back in 3 months.  If she has no significant improvement we will then discuss whether to proceed with peripheral angiography and endovascular therapy.  Carotid artery disease (HCC) History of moderate left ICA stenosis by duplex ultrasound 05/12/2022.  These will be repeated.     Dorn DOROTHA Lesches MD FACP,FACC,FAHA, [facility] 08/30/2023 3:35 PM

## 2023-08-30 NOTE — Assessment & Plan Note (Signed)
 [name] returns today for follow-up of her PAD.  She recently saw Lamarr Satterfield, DNP in the office last month complaining of left lower extremity claudication.  I have performed multiple procedures on both legs in the past beginning 07/07/2014 Cervene on her occluded distal right SFA and popliteal artery.  On 02/22/2014 I intervened on her occluded left SFA.  I angiogram to her in 2017 and performed directional atherectomy and drug-coated balloon angioplasty of the mid left SFA.  Her most recent procedure performed 02/06/2018 was notable for intervention on an occluded distal right SFA using directional atherectomy and drug-coated balloon angioplasty as well as her right common femoral artery.  At that time her left SFA was widely patent.  Her Doppler studies performed a year ago revealed a left ABI of 0.68 with a patent left SFA however her most recent Doppler performed 08/26/2023 revealed a decline in her left ABI to 0.63 with an occluded left SFA.  She does have one-vessel runoff via peroneal bilaterally.  At this point she is not sure whether she wants to proceed with invasive evaluation and treatment strategy but wishes to pursue pharmacologic therapy.  Will start her on Pletal  50 mg p.o. twice daily and I will see her back in 3 months.  If she has no significant improvement we will then discuss whether to proceed with peripheral angiography and endovascular therapy.

## 2023-08-30 NOTE — Patient Instructions (Signed)
 Medication Instructions:  Your physician has recommended you make the following change in your medication:   -Start taking cilostazol  (pletal ) 50mg  twice daily.  *If you need a refill on your cardiac medications before your next appointment, please call your pharmacy*   Testing/Procedures: Your physician has requested that you have a carotid duplex. This test is an ultrasound of the carotid arteries in your neck. It looks at blood flow through these arteries that supply the brain with blood. Allow one hour for this exam. There are no restrictions or special instructions. This will take place at [address], [address].  Please note: We ask at that you not bring children with you during ultrasound (echo/ vascular) testing. Due to room size and safety concerns, children are not allowed in the ultrasound rooms during exams. Our front office staff cannot provide observation of children in our lobby area while testing is being conducted. An adult accompanying a patient to their appointment will only be allowed in the ultrasound room at the discretion of the ultrasound technician under special circumstances. We apologize for any inconvenience.   Follow-Up: At [facility], you and your health needs are our priority.  As part of our continuing mission to provide you with exceptional heart care, we have created designated Provider Care Teams.  These Care Teams include your primary Cardiologist (physician) and Advanced Practice Providers (APPs -  Physician Assistants and Nurse Practitioners) who all work together to provide you with the care you need, when you need it.  We recommend signing up for the patient portal called MyChart.  Sign up information is provided on this After Visit Summary.  MyChart is used to connect with patients for Virtual Visits (Telemedicine).  Patients are able to view lab/test results, encounter notes, upcoming appointments, etc.  Non-urgent messages can be sent to  your provider as well.   To learn more about what you can do with MyChart, go to [email or web address].    Your next appointment:   3 month(s)  Provider:   Dorn Lesches, MD

## 2023-09-01 ENCOUNTER — Inpatient Hospital Stay: Payer: Medicare (Managed Care) | Attending: Internal Medicine

## 2023-09-01 ENCOUNTER — Ambulatory Visit (HOSPITAL_COMMUNITY)
Admission: RE | Admit: 2023-09-01 | Discharge: 2023-09-01 | Disposition: A | Discharge status: Home / Self Care | Payer: Medicare (Managed Care) | Source: Ambulatory Visit | Attending: Physician Assistant | Admitting: Physician Assistant

## 2023-09-01 DIAGNOSIS — C3491 Malignant neoplasm of unspecified part of right bronchus or lung: Secondary | ICD-10-CM | POA: Insufficient documentation

## 2023-09-01 DIAGNOSIS — Z7902 Long term (current) use of antithrombotics/antiplatelets: Secondary | ICD-10-CM | POA: Insufficient documentation

## 2023-09-01 DIAGNOSIS — Z08 Encounter for follow-up examination after completed treatment for malignant neoplasm: Secondary | ICD-10-CM | POA: Insufficient documentation

## 2023-09-01 DIAGNOSIS — I739 Peripheral vascular disease, unspecified: Secondary | ICD-10-CM | POA: Diagnosis not present

## 2023-09-01 DIAGNOSIS — Z85118 Personal history of other malignant neoplasm of bronchus and lung: Secondary | ICD-10-CM | POA: Diagnosis present

## 2023-09-01 LAB — CBC WITH DIFFERENTIAL (CANCER CENTER ONLY)
Abs Immature Granulocytes: 0.01 10*3/uL (ref 0.00–0.07)
Basophils Absolute: 0 10*3/uL (ref 0.0–0.1)
Basophils Relative: 0 %
Eosinophils Absolute: 0.2 10*3/uL (ref 0.0–0.5)
Eosinophils Relative: 4 %
HCT: 37.7 % (ref 36.0–46.0)
Hemoglobin: 12.5 g/dL (ref 12.0–15.0)
Immature Granulocytes: 0 %
Lymphocytes Relative: 30 %
Lymphs Abs: 1.5 10*3/uL (ref 0.7–4.0)
MCH: 29.3 pg (ref 26.0–34.0)
MCHC: 33.2 g/dL (ref 30.0–36.0)
MCV: 88.3 fL (ref 80.0–100.0)
Monocytes Absolute: 0.4 10*3/uL (ref 0.1–1.0)
Monocytes Relative: 7 %
Neutro Abs: 3 10*3/uL (ref 1.7–7.7)
Neutrophils Relative %: 59 %
Platelet Count: 248 10*3/uL (ref 150–400)
RBC: 4.27 MIL/uL (ref 3.87–5.11)
RDW: 13.7 % (ref 11.5–15.5)
WBC Count: 5.1 10*3/uL (ref 4.0–10.5)
nRBC: 0 % (ref 0.0–0.2)

## 2023-09-01 LAB — CMP (CANCER CENTER ONLY)
ALT: 11 U/L (ref 0–44)
AST: 15 U/L (ref 15–41)
Albumin: 3.9 g/dL (ref 3.5–5.0)
Alkaline Phosphatase: 72 U/L (ref 38–126)
Anion gap: 9 (ref 5–15)
BUN: 9 mg/dL (ref 8–23)
CO2: 25 mmol/L (ref 22–32)
Calcium: 8.9 mg/dL (ref 8.9–10.3)
Chloride: 108 mmol/L (ref 98–111)
Creatinine: 0.65 mg/dL (ref 0.44–1.00)
GFR, Estimated: >60 mL/min (ref >60)
Glucose, Bld: 130 mg/dL — ABNORMAL HIGH (ref 70–99)
Potassium: 3.3 mmol/L — ABNORMAL LOW (ref 3.5–5.1)
Sodium: 142 mmol/L (ref 135–145)
Total Bilirubin: 0.5 mg/dL (ref 0.0–1.2)
Total Protein: 6.7 g/dL (ref 6.5–8.1)

## 2023-09-01 MED ORDER — IOHEXOL 300 MG/ML  SOLN
75.0000 mL | Freq: Once | INTRAMUSCULAR | Status: AC | PRN
  Administered 2023-09-01: 75 mL via INTRAVENOUS

## 2023-09-07 ENCOUNTER — Inpatient Hospital Stay (HOSPITAL_BASED_OUTPATIENT_CLINIC_OR_DEPARTMENT_OTHER): Payer: Medicare (Managed Care) | Admitting: Internal Medicine

## 2023-09-07 VITALS — BP 139/67 | HR 86 | Temp 98.1°F | Resp 17 | Ht 63.0 in | Wt 126.9 lb

## 2023-09-07 DIAGNOSIS — C3490 Malignant neoplasm of unspecified part of unspecified bronchus or lung: Secondary | ICD-10-CM | POA: Diagnosis not present

## 2023-09-07 DIAGNOSIS — Z08 Encounter for follow-up examination after completed treatment for malignant neoplasm: Secondary | ICD-10-CM | POA: Diagnosis not present

## 2023-09-07 NOTE — Progress Notes (Signed)
 [facility] [phone]   [phone]  OFFICE PROGRESS NOTE  Maryellen Snare, NP [address] [place] [address]  DIAGNOSIS: Multifocal likely stage IV (T1b, N0, M1 a) non-small cell lung cancer, adenocarcinoma presented with multiple bilateral pulmonary nodules diagnosed in April 2023. The patient had molecular studies by foundation 1 that showed no actionable mutations and PD-L1 expression was negative.  PRIOR THERAPY:  Status post surgical resection of the 2 prominent pulmonary nodules in the right upper lobe as well as the right lower lobe with wedge resection of the right upper lobe nodule and superior segmentectomy of the right lower lobe on December 11, 2021 under the care of Dr. Luna Salinas.  CURRENT THERAPY: Observation.  INTERVAL HISTORY: [name] 78 y.o. female returns to the clinic today for 69-month follow-up visit.Discussed the use of AI scribe software for clinical note transcription with the patient, who gave verbal consent to proceed.  History of Present Illness   The patient, a 78 year old with a history of multifocal adenocarcinoma of the lung, underwent a thoracic surgery in April 2023. She has been under observation since then. Over the past six months, the patient reports no significant changes in her health status. However, she contracted COVID-19 about a month ago.  Recently, the patient has been experiencing itching and sweating around the waist and under the breasts, which she initially suspected to be shingles. She also reports difficulty walking after a certain length of time due to pain in the left leg. A sonogram revealed a blockage in the leg, for which she was prescribed Cilostazol  (Pletal ) twice daily.  The patient sought treatment for the itching and was prescribed Valacyclovir  by her primary care physician. Since starting this medication, she has been experiencing pain in her sides. She also underwent a CT  scan of the abdomen and pelvis in September, which showed no changes in her nodules. A recent CT scan of the chest was performed, but the results are pending at the time of the conversation.        MEDICAL HISTORY: Past Medical History:  Diagnosis Date   Anemia    Anxiety    hx   Arthritis    "my whole body"   Atrial septal aneurysm    COLONIC POLYPS, HX OF    COPD (chronic obstructive pulmonary disease) (HCC)    no per pt on 03/21/2014 & 02/06/2018   Coronary artery disease    a. s/p CABG 2012.   DISC DISEASE, CERVICAL    Gastroesophageal reflux disease    Headache    "I have headaches all the time" (02/06/2018)   Heart murmur    as a child   History of rheumatic fever    Hyperlipidemia    Hypertension    Hypothyroidism    Insomnia    Osteoarthritis    OSTEOPENIA    PAD (peripheral artery disease) (HCC)    a. PTA to R SFA 2015. b. PTA to L SFA 05/2016.   Plantar fasciitis of left foot 10/02/2019   Pre-diabetes    RSD (reflex sympathetic dystrophy) 10/31/2011   Vitamin D  deficiency     ALLERGIES:  is allergic to lipitor [atorvastatin ].  MEDICATIONS:  Current Outpatient Medications  Medication Sig Dispense Refill   albuterol  (VENTOLIN  HFA) 108 (90 Base) MCG/ACT inhaler      amLODipine  (NORVASC ) 10 MG tablet TAKE ONE TABLET BY MOUTH ONCE DAILY 90 tablet 1   aspirin  EC 81 MG tablet  Take 81 mg by mouth daily.     Calcium  Carb-Cholecalciferol  (CALCIUM  + D3) 600-800 MG-UNIT TABS Take 1 tablet by mouth 2 (two) times daily.     cilostazol  (PLETAL ) 50 MG tablet Take 1 tablet (50 mg total) by mouth 2 (two) times daily. 180 tablet 1   cyclobenzaprine  (FLEXERIL ) 10 MG tablet Take 10 mg by mouth at bedtime as needed for muscle spasms.     diclofenac  Sodium (VOLTAREN ) 1 % GEL daily as needed.     ezetimibe  (ZETIA ) 10 MG tablet Take 10 mg by mouth daily.     fluticasone  (FLONASE ) 50 MCG/ACT nasal spray USE 1 TO 2 SPRAY(S) IN EACH NOSTRIL ONCE DAILY AS NEEDED FOR 30 DAYS      gabapentin  (NEURONTIN ) 100 MG capsule Take 2 capsules (200 mg total) by mouth 3 (three) times daily. For control of nerve pain at site of surgery. 60 capsule 1   Lancets (ONETOUCH DELICA PLUS LANCET33G) MISC 1 each by Other route 2 (two) times daily.     levothyroxine  (SYNTHROID ) 100 MCG tablet Take 100 mcg by mouth daily before breakfast.     lisinopril  (ZESTRIL ) 2.5 MG tablet Take 2.5 mg by mouth daily.     lubiprostone (AMITIZA) 24 MCG capsule Take 24 mcg by mouth daily.     meloxicam  (MOBIC ) 7.5 MG tablet Take 7.5 mg by mouth daily as needed for pain.     Multiple Vitamin (MULTIVITAMIN WITH MINERALS) TABS tablet Take 1 tablet by mouth every morning. Centrum     ondansetron  (ZOFRAN ) 4 MG tablet Take 4 mg by mouth every 8 (eight) hours as needed.     ONETOUCH VERIO test strip 1 each by Other route 2 (two) times daily.     pantoprazole  (PROTONIX ) 40 MG tablet Take 1 tablet (40 mg total) by mouth daily before breakfast. 60 tablet 1   polyethylene glycol (MIRALAX ) 17 g packet Take 17 g by mouth daily. 14 each 0   Propylene Glycol 0.6 % SOLN Place 1 drop into both eyes every 4 (four) hours as needed (dry eyes).     senna-docusate (SENOKOT-S) 8.6-50 MG tablet Take 1 tablet by mouth daily. 14 tablet 0   traMADol  (ULTRAM ) 50 MG tablet Take 1 tablet (50 mg total) by mouth every 6 (six) hours as needed. 25 tablet 0   Vitamin D , Ergocalciferol , (DRISDOL ) 1.25 MG (50000 UNIT) CAPS capsule Take 50,000 Units by mouth once a week.     Current Facility-Administered Medications  Medication Dose Route Frequency Provider Last Rate Last Admin   0.9 %  sodium chloride  infusion  500 mL Intravenous Once Pyrtle, Amber Bail, MD       Facility-Administered Medications Ordered in Other Visits  Medication Dose Route Frequency Provider Last Rate Last Admin   tranexamic acid  (CYKLOKAPRON ) 2,000 mg in sodium chloride  0.9 % 50 mL Topical Application  2,000 mg Topical Once [place], Amber, PA-C        SURGICAL HISTORY:   Past Surgical History:  Procedure Laterality Date   ABDOMINAL HYSTERECTOMY  1977   "partial"   BALLOON ANGIOPLASTY, ARTERY Right 03/21/2014   SFA   CARDIAC CATHETERIZATION  2011    CARPAL TUNNEL RELEASE Right 2005   CATARACT EXTRACTION Left 2013   CORONARY ARTERY BYPASS GRAFT  10/2009   LIMA to the LAD, saphenous vein graft to the acute marginal, saphenous vein graft to the diagonal and obtuse marginal.   DILATION AND CURETTAGE OF UTERUS  1978   INTERCOSTAL NERVE BLOCK  Right 12/11/2021   Procedure: INTERCOSTAL NERVE BLOCK;  Surgeon: Zelphia Higashi, MD;  Location: [facility] OR;  Service: Thoracic;  Laterality: Right;   JOINT REPLACEMENT     LOWER EXTREMITY ANGIOGRAM N/A 03/21/2014   Procedure: LOWER EXTREMITY ANGIOGRAM;  Surgeon: Avanell Leigh, MD;  Location: [facility] CATH LAB;  Service: Cardiovascular;  Laterality: N/A;   LOWER EXTREMITY ANGIOGRAM  06/07/2016   Abdominal aortogram/bilateral iliac angiogram/bifemoral runoff   LOWER EXTREMITY ANGIOGRAPHY N/A 02/06/2018   Procedure: LOWER EXTREMITY ANGIOGRAPHY;  Surgeon: Avanell Leigh, MD;  Location: [facility] INVASIVE CV LAB;  Service: Cardiovascular;  Laterality: N/A;   LYMPH NODE DISSECTION  12/11/2021   Procedure: LYMPH NODE DISSECTION;  Surgeon: Zelphia Higashi, MD;  Location: [facility] OR;  Service: Thoracic;;   PERIPHERAL VASCULAR ATHERECTOMY  02/06/2018   Procedure: PERIPHERAL VASCULAR ATHERECTOMY;  Surgeon: Avanell Leigh, MD;  Location: [facility] INVASIVE CV LAB;  Service: Cardiovascular;;   PERIPHERAL VASCULAR CATHETERIZATION Bilateral 06/07/2016   Procedure: Lower Extremity Angiography;  Surgeon: Avanell Leigh, MD;  Location: [facility] INVASIVE CV LAB;  Service: Cardiovascular;  Laterality: Bilateral;   PERIPHERAL VASCULAR CATHETERIZATION Left 06/07/2016   Procedure: Peripheral Vascular Atherectomy;  Surgeon: Avanell Leigh, MD;  Location: [facility] INVASIVE CV LAB;  Service: Cardiovascular;  Laterality: Left;  SFA   PERIPHERAL VASCULAR CATHETERIZATION  Left 06/07/2016   Procedure: Peripheral Vascular Balloon Angioplasty;  Surgeon: Avanell Leigh, MD;  Location: [facility] INVASIVE CV LAB;  Service: Cardiovascular;  Laterality: Left;  SFA   THYROIDECTOMY  1995   TONSILLECTOMY     TOTAL KNEE ARTHROPLASTY Right 06/18/2014   Procedure: RIGHT TOTAL KNEE ARTHROPLASTY;  Surgeon: Florencia Hunter, MD;  Location: [facility] ORS;  Service: Orthopedics;  Laterality: Right;   XI ROBOTIC ASSISTED THORACOSCOPY- SEGMENTECTOMY Right 12/11/2021   Procedure: XI ROBOTIC ASSISTED THORACOSCOPY-RIGHT LOWER LOBE SUPERIOR SEGMENT, RIGHT UPPER LOBE WEDGE;  Surgeon: Zelphia Higashi, MD;  Location: [facility] OR;  Service: Thoracic;  Laterality: Right;    REVIEW OF SYSTEMS:  A comprehensive review of systems was negative except for: Integument/breast: positive for dryness and pruritus   PHYSICAL EXAMINATION: General appearance: alert, cooperative, and no distress Head: Normocephalic, without obvious abnormality, atraumatic Neck: no adenopathy, no JVD, supple, symmetrical, trachea midline, and thyroid  not enlarged, symmetric, no tenderness/mass/nodules Lymph nodes: Cervical, supraclavicular, and axillary nodes normal. Resp: clear to auscultation bilaterally Back: symmetric, no curvature. ROM normal. No CVA tenderness. Cardio: regular rate and rhythm, S1, S2 normal, no murmur, click, rub or gallop GI: soft, non-tender; bowel sounds normal; no masses,  no organomegaly Extremities: extremities normal, atraumatic, no cyanosis or edema  ECOG PERFORMANCE STATUS: 1 - Symptomatic but completely ambulatory  Blood pressure 139/67, pulse 86, temperature 98.1 F (36.7 C), temperature source Temporal, resp. rate 17, height 5\' 3"  (1.6 m), weight 126 lb 14.4 oz (57.6 kg), SpO2 98%.  LABORATORY DATA: Lab Results  Component Value Date   WBC 5.1 09/01/2023   HGB 12.5 09/01/2023   HCT 37.7 09/01/2023   MCV 88.3 09/01/2023   PLT 248 09/01/2023      Chemistry      Component Value Date/Time    NA 142 09/01/2023 0747   NA 142 07/07/2021 1008   K 3.3 (L) 09/01/2023 0747   CL 108 09/01/2023 0747   CO2 25 09/01/2023 0747   BUN 9 09/01/2023 0747   BUN 9 07/07/2021 1008   CREATININE 0.65 09/01/2023 0747   CREATININE 0.71 05/25/2016 1257      Component Value  Date/Time   CALCIUM  8.9 09/01/2023 0747   ALKPHOS 72 09/01/2023 0747   AST 15 09/01/2023 0747   ALT 11 09/01/2023 0747   BILITOT 0.5 09/01/2023 0747       RADIOGRAPHIC STUDIES: VAS US  LOWER EXTREMITY ARTERIAL DUPLEX Result Date: 08/26/2023 LOWER EXTREMITY ARTERIAL DUPLEX STUDY Patient Name:  NOEHMI COTT  Date of Exam:   08/26/2023 Medical Rec #: [id number]         Accession #:    [id number] Date of Birth: 02-Dec-1945         Patient Gender: F Patient Age:   20 years Exam Location:  [facility] Procedure:      VAS US  LOWER EXTREMITY ARTERIAL DUPLEX Referring Phys: JONATHAN BERRY --------------------------------------------------------------------------------  Indications: Claudication, and peripheral artery disease. High Risk Factors: Hypertension, hyperlipidemia, Diabetes, past history of                    smoking, coronary artery disease. Other Factors: Patient complians of pain and cramping in both legs with                ambulation and at rest. Pain is worse in the left leg with                walking.  Vascular Interventions: [facility] one directional atherectomy mid to distal left SFA,                         drug eluting balloon angioplasty left SFA on                         06/07/2016. Hawk 1 directional atherectomy and                         angioplasty                         distal right SFA. Chocolate balloon angioplasty distal                         right. Current ABI:            Right 0.86 Left 0.63 Comparison Study: On 08/25/22 Arterial Duplex showed >50% stenosis in the right                   proxiaml SFA and left mid SFA. The velocities in the mid left                   SFA were 282 cm/s. Performing Technologist: Lyndal Sandy RDMS, RVT, RDCS  Examination Guidelines: A complete evaluation includes B-mode imaging, spectral Doppler, color Doppler, and power Doppler as needed of all accessible portions of each vessel. Bilateral testing is considered an integral part of a complete examination. Limited examinations for reoccurring indications may be performed as noted.  +----------+--------+-----+---------------+--------+----------------+ RIGHT     PSV cm/sRatioStenosis       WaveformComments         +----------+--------+-----+---------------+--------+----------------+ CFA Mid   88                          biphasic                 +----------+--------+-----+---------------+--------+----------------+ CFA Distal70  biphasic                 +----------+--------+-----+---------------+--------+----------------+ DFA       184                                                  +----------+--------+-----+---------------+--------+----------------+ SFA Prox  207          50-74% stenosisbiphasicdistal to ostium +----------+--------+-----+---------------+--------+----------------+ SFA Mid   117                         biphasic                 +----------+--------+-----+---------------+--------+----------------+ SFA Distal33                          biphasic                 +----------+--------+-----+---------------+--------+----------------+ POP Prox  56                          biphasic                 +----------+--------+-----+---------------+--------+----------------+ POP Distal55                          biphasic                 +----------+--------+-----+---------------+--------+----------------+ TP Trunk  36                          biphasic                 +----------+--------+-----+---------------+--------+----------------+ PTA Prox  93                          biphasic                  +----------+--------+-----+---------------+--------+----------------+ PERO Prox 80                          biphasic                 +----------+--------+-----+---------------+--------+----------------+ A focal velocity elevation of 208 cm/s was obtained at Prox SFA with a VR of 3.0. Findings are characteristic of 50-74% stenosis.  +----------+--------+-----+--------+----------+-------------------+ LEFT      PSV cm/sRatioStenosisWaveform  Comments            +----------+--------+-----+--------+----------+-------------------+ CFA Distal137                  biphasic                      +----------+--------+-----+--------+----------+-------------------+ DFA       240                  biphasic                      +----------+--------+-----+--------+----------+-------------------+ SFA Prox  14                   monophasic                    +----------+--------+-----+--------+----------+-------------------+ SFA  Mid                occluded          Heavy calcification +----------+--------+-----+--------+----------+-------------------+ SFA Distal11                   monophasic                    +----------+--------+-----+--------+----------+-------------------+ POP Prox  34                   monophasic                    +----------+--------+-----+--------+----------+-------------------+ POP Distal16                   monophasic                    +----------+--------+-----+--------+----------+-------------------+ TP Trunk  19                   monophasic                    +----------+--------+-----+--------+----------+-------------------+ PTA Prox  12                   monophasic                    +----------+--------+-----+--------+----------+-------------------+ PERO Prox 23                   monophasic                    +----------+--------+-----+--------+----------+-------------------+  Summary: Right: 50-74% stenosis noted in  the superficial femoral artery. No significant change compared to previous study. Left: Total occlusion noted in the superficial femoral artery. Severe progression is noted compared to previous study. See ABI study.  See table(s) above for measurements and observations. Suggest Peripheral Vascular Consult. Electronically signed by Janelle Mediate MD on 08/26/2023 at 12:54:30 PM.    Final    VAS US  ABI WITH/WO TBI Result Date: 08/26/2023  LOWER EXTREMITY DOPPLER STUDY Patient Name:  JATASHA CHAMORRO  Date of Exam:   08/26/2023 Medical Rec #: [id number]         Accession #:    [id number] Date of Birth: Aug 02, 1946         Patient Gender: F Patient Age:   36 years Exam Location:  [facility] Procedure:      VAS US  ABI WITH/WO TBI Referring Phys: Arlyce Lambert BERRY --------------------------------------------------------------------------------  Indications: Claudication, and peripheral artery disease. High Risk Factors: Hypertension, hyperlipidemia, Diabetes, past history of                    smoking, coronary artery disease. Other Factors: Patient complians of pain and cramping in both legs with                ambulation and at rest. Pain is worse in the left leg with                walking.  Vascular Interventions: [facility] one directional atherectomy mid to distal left SFA,                         drug eluting balloon angioplasty left SFA on                         06/07/2016. Hawk 1  directional atherectomy and                         angioplasty                         distal right SFA. Chocolate balloon angioplasty distal                         right. Comparison Study: On 08/25/22 right ABI was 0.84 and lerft ABI was 0.68. Performing Technologist: Lyndal Sandy RVT RDCS  Examination Guidelines: A complete evaluation includes at minimum, Doppler waveform signals and systolic blood pressure reading at the level of bilateral brachial, anterior tibial, and posterior tibial arteries, when vessel segments are accessible. Bilateral  testing is considered an integral part of a complete examination. Photoelectric Plethysmograph (PPG) waveforms and toe systolic pressure readings are included as required and additional duplex testing as needed. Limited examinations for reoccurring indications may be performed as noted.  ABI Findings: +---------+------------------+-----+--------+--------+ Right    Rt Pressure (mmHg)IndexWaveformComment  +---------+------------------+-----+--------+--------+ Brachial 138                    biphasic         +---------+------------------+-----+--------+--------+ ATA      122               0.84 biphasic         +---------+------------------+-----+--------+--------+ PTA      125               0.86 biphasic         +---------+------------------+-----+--------+--------+ PERO     122               0.84 biphasic         +---------+------------------+-----+--------+--------+ Great Toe100               0.69 Abnormal         +---------+------------------+-----+--------+--------+ +---------+------------------+-----+----------+-------+ Left     Lt Pressure (mmHg)IndexWaveform  Comment +---------+------------------+-----+----------+-------+ Brachial 145                    biphasic          +---------+------------------+-----+----------+-------+ ATA      90                0.62 monophasic        +---------+------------------+-----+----------+-------+ PTA      91                0.63 monophasic        +---------+------------------+-----+----------+-------+ PERO     90                0.65 monophasic        +---------+------------------+-----+----------+-------+ Great Toe57                0.39 Abnormal          +---------+------------------+-----+----------+-------+ +-------+-----------+-----------+------------+------------+ ABI/TBIToday's ABIToday's TBIPrevious ABIPrevious TBI +-------+-----------+-----------+------------+------------+ Right  0.86        0.69       0.84        0.70         +-------+-----------+-----------+------------+------------+ Left   0.65       0.39       0.68        0.32         +-------+-----------+-----------+------------+------------+ Bilateral ABIs and TBIs appear essentially unchanged. See Arterial Duplex study  Summary: Right: Resting right ankle-brachial index indicates mild right lower extremity arterial disease. The right toe-brachial index is abnormal. Left: Resting left ankle-brachial index indicates moderate left lower extremity arterial disease. The left toe-brachial index is abnormal. *See table(s) above for measurements and observations.  Electronically signed by Janelle Mediate MD on 08/26/2023 at 12:47:09 PM.    Final     ASSESSMENT AND PLAN: This is a very pleasant 78 years old African-American female diagnosed with multifocal likely stage IV (T1b, N0, M1 a) non-small cell lung cancer, adenocarcinoma presented with multiple bilateral pulmonary nodules status post surgical resection of the 2 prominent pulmonary nodules in the right upper lobe as well as the right lower lobe with wedge resection of the right upper lobe nodule and superior segmentectomy of the right lower lobe on December 11, 2021 under the care of Dr. Luna Salinas. The patient had molecular studies by foundation 1 that showed no actionable mutations and PD-L1 expression was negative. The patient has been on observation and she is feeling fine with no concerning complaints except for itching and currently being treated by her primary care physician for suspicious herpes zoster. She had repeat CT scan of the chest performed last week but the final report is still pending.  I personally and independently reviewed the scan images in comparison to her previous scan 6 months ago and I do not see any concerning findings for disease progression but I will wait for the final report for confirmation.    Pulmonary Adenocarcinoma Diagnosed April 2023. Under  surveillance with no new symptoms. Recent CT scan shows no significant changes compared to six months ago. Awaiting final report. Previous CT of abdomen and pelvis in September showed no changes in nodules. - Repeat lab and CT scan of the chest in six months - Follow-up appointment in six months  Peripheral Arterial Disease Blockage in the left leg diagnosed by cardiologist. Reports claudication and pain after walking a certain distance. Managed with Cilostazol  100 mg twice daily. - Continue Cilostazol  100 mg twice daily  Possible Herpes Zoster Reports itching, sweating around the waist and under the breast, and pain in the sides. Differential diagnosis includes herpes zoster. Currently treated with Valacyclovir . - Continue Valacyclovir  as prescribed by primary care physician.   She was advised to call immediately if she has any concerning symptoms in the interval. The patient voices understanding of current disease status and treatment options and is in agreement with the current care plan.  All questions were answered. The patient knows to call the clinic with any problems, questions or concerns. We can certainly see the patient much sooner if necessary.  The total time spent in the appointment was 20 minutes.  Disclaimer: This note was dictated with voice recognition software. Similar sounding words can inadvertently be transcribed and may not be corrected upon review.

## 2023-09-15 ENCOUNTER — Ambulatory Visit (HOSPITAL_COMMUNITY)
Admission: RE | Admit: 2023-09-15 | Discharge: 2023-09-15 | Disposition: A | Discharge status: Home / Self Care | Payer: Medicare (Managed Care) | Source: Ambulatory Visit | Attending: Internal Medicine | Admitting: Internal Medicine

## 2023-09-15 ENCOUNTER — Other Ambulatory Visit (HOSPITAL_COMMUNITY): Payer: Self-pay

## 2023-09-15 DIAGNOSIS — I6522 Occlusion and stenosis of left carotid artery: Secondary | ICD-10-CM | POA: Diagnosis present

## 2023-09-15 DIAGNOSIS — I779 Disorder of arteries and arterioles, unspecified: Secondary | ICD-10-CM

## 2023-09-20 ENCOUNTER — Telehealth: Payer: Self-pay | Admitting: Cardiovascular Disease

## 2023-09-20 NOTE — Telephone Encounter (Signed)
Called and spoke to patient. Verified name and DOB. Below message relayed per Dr Allyson Sabal. No questions at this time.      Runell Gess, MD 09/15/2023  9:05 AM EST   Moderate left ICA stenosis.  Repeat 12 months

## 2023-09-20 NOTE — Telephone Encounter (Signed)
Follow Up:       Patient would like for someone to please call and explain her ultrasond results please.

## 2023-09-26 ENCOUNTER — Telehealth: Payer: Self-pay | Admitting: Cardiology

## 2023-09-26 NOTE — Telephone Encounter (Signed)
Patient stated she is having sharp pain in neck on the left side and goes up to her ear and head. Patient stated the pain will come and go. Patient stated this pain will go into her legs and down to the heel of her foot. Patient denied symptoms of chest pain, lightheadedness, or weakness on left side of body. Please advise.

## 2023-09-26 NOTE — Telephone Encounter (Signed)
Spoke with patient of Dr. Allyson Sabal & Dr. Jens Som  She reports sharp pain on the left side of her neck -- ear/head. Symptoms began AFTER she saw MD. The issue is constant - not better or worse since onset. This is occasional, not constant. The pain is not positional when rotating head. She is unsure if triggers with upper extremity movement. She reports occasional pains that go down into her legs. All occurring on her left side.   She had lower ext dopplers 08/2023  She saw MD on 08/30/23 -- started on pletal 50mg  twice daily  She just had a carotid doppler on 09/15/23 -- moderate L-ICA stenosis  Advised will send a message to Dr. Allyson Sabal for review of her new concerns since last visit

## 2023-09-26 NOTE — Telephone Encounter (Signed)
Runell Gess, MD  Lindell Spar, RN Caller: Unspecified (Today,  8:20 AM) Her symptoms do not sound cardiovascular to me.  Spoke with patient. Relayed message from MD. She has an appointment with PCP next week

## 2023-11-08 ENCOUNTER — Ambulatory Visit (INDEPENDENT_AMBULATORY_CARE_PROVIDER_SITE_OTHER): Payer: Medicare Other | Admitting: Podiatry

## 2023-11-08 ENCOUNTER — Encounter: Payer: Self-pay | Admitting: Podiatry

## 2023-11-08 VITALS — Ht 63.0 in | Wt 126.9 lb

## 2023-11-08 DIAGNOSIS — M2041 Other hammer toe(s) (acquired), right foot: Secondary | ICD-10-CM

## 2023-11-08 DIAGNOSIS — M79674 Pain in right toe(s): Secondary | ICD-10-CM | POA: Diagnosis not present

## 2023-11-08 DIAGNOSIS — M79675 Pain in left toe(s): Secondary | ICD-10-CM | POA: Diagnosis not present

## 2023-11-08 DIAGNOSIS — L84 Corns and callosities: Secondary | ICD-10-CM | POA: Diagnosis not present

## 2023-11-08 DIAGNOSIS — B351 Tinea unguium: Secondary | ICD-10-CM | POA: Diagnosis not present

## 2023-11-08 DIAGNOSIS — E1151 Type 2 diabetes mellitus with diabetic peripheral angiopathy without gangrene: Secondary | ICD-10-CM

## 2023-11-08 NOTE — Progress Notes (Signed)
 Subjective:  Patient ID: [name], female    DOB: [birth year],  MRN: [id number]  78 y.o. female presents with at risk foot care. Pt has h/o NIDDM with PAD and corn(s) right lower extremity, callus(es) of both feet and painful mycotic nails.  Pain interferes with ambulation. Aggravating factors include wearing enclosed shoe gear. Painful toenails interfere with ambulation. Aggravating factors include wearing enclosed shoe gear. Pain is relieved with periodic professional debridement. Painful corns and calluses are aggravated when weightbearing with and without shoegear. Pain is relieved with periodic professional debridement. Chief Complaint  Patient presents with   Nail Problem    Pt is here for [facility] last A1C she is unsure PCP is Dr Jacinto Reap and LOV was in February.    PCP: Hillery Aldo, NP.  New problem(s): None.   Review of Systems: Negative except as noted in the HPI.   Allergies  Allergen Reactions   Lipitor [Atorvastatin] Other (See Comments)    Muscle aches    Objective:  There were no vitals filed for this visit. Constitutional Patient is a pleasant 78 y.o. African American female WD, WN in NAD. AAO x 3.  Vascular Capillary fill time to digits <4 seconds.  DP/PT pulse(s) are nonpalpable b/l lower extremities. Pedal hair absent b/l. Lower extremity skin temperature gradient warm to cool b/l. No pain with calf compression b/l. No cyanosis or clubbing noted. No ischemia nor gangrene noted b/l.   Neurologic Protective sensation intact 5/5 intact bilaterally with 10g monofilament b/l. Vibratory sensation intact b/l. No clonus b/l.   Dermatologic Pedal skin is thin, shiny and atrophic b/l.  No open wounds b/l lower extremities. No interdigital macerations b/l lower extremities. Toenails 1-5 b/l elongated, discolored, dystrophic, thickened, crumbly with subungual debris and tenderness to dorsal palpation. Minimal hyperkeratos(is/es) noted submet head 5 b/l. Hyperkeratotic lesion(s)  distal tip of right 3rd toe and dorsal PIPJ of R 4th toe.  No erythema, no edema, no drainage, no fluctuance.  Orthopedic: Normal muscle strength 5/5 to all lower extremity muscle groups bilaterally. Hammertoe deformity noted 2-5 b/l.   Last HgA1c:      No data to display           Assessment:   1. Pain due to onychomycosis of toenails of both feet   2. Corns and callosities   3. Acquired hammertoe of right foot   4. Type II diabetes mellitus with peripheral circulatory disorder (HCC)    Plan:   Orders Placed This Encounter  Procedures   For Home Use Only DME Diabetic Shoe    Dispense one pair extra depth shoe and 3 pair total contact insoles. Offload submet head 5 b/l. Shoes must have stretchable uppers due to dorsal corn formation.  Consent given for treatment. Patient examined. All patient's and/or POA's questions/concerns addressed on today's visit. Order written for new diabetic shoes and total contact insoles as above. Toenails 1-5 debrided in length and girth without incident. Corn(s) and callus(es) R 3rd toe, R 4th toe, and submet head 5 b/l pared with sharp debridement without incident. Continue foot and shoe inspections daily. Monitor blood glucose per PCP/Endocrinologist's recommendations.Continue soft, supportive shoe gear daily. Report any pedal injuries to medical professional. Call office if there are any questions/concerns. -Patient/POA to call should there be question/concern in the interim.  Return in about 3 months (around 02/08/2024).  Freddie Breech, DPM      [place] LOCATION: [address]  [place], [place] [address]                   Office [phone]   [facility] LOCATION: [address] [place], [place] [address] Office [phone] Freddie Breech, DPM

## 2023-11-09 ENCOUNTER — Other Ambulatory Visit: Payer: Self-pay | Admitting: Oral Surgery

## 2023-11-09 DIAGNOSIS — D492 Neoplasm of unspecified behavior of bone, soft tissue, and skin: Secondary | ICD-10-CM

## 2023-11-09 DIAGNOSIS — K122 Cellulitis and abscess of mouth: Secondary | ICD-10-CM

## 2023-11-09 NOTE — Progress Notes (Unsigned)
 Pathology Examination  Date:   November 09, 2023    Patient:  [name]    PID:   [id number]  DOB: Oct 17, 1945   Sex:   Female   Referred by DDS for evaluation of lesion on chin.  Chief Complaint: Has place on her chin present for about a year. No pain.  Past Medical History:   Peripheral Artery Disease, Osteopenia, Hypothyroid, HTN, Hyperlipidemia, GERD, CAD, Arthritis, Rheumatic Fever, Bypass, Lung Cancer, Pre-Diabetes, Bruise Easily, Anxiety, COPD   Medications:   Flexeril, Albuterol, Amlodipine, ASA, Cilostazol, Zetia, Flonase, Neurontin, Synthroid, Amitiza, Mobic, Zofran, Protonix, Miralax, Ultram, Rosuvastatin  Allergies:   Lipitor     Exam: Extra/oral: Involuted area right chin. No purulence, fluctuance, edema.  Intra/oral: Edentulous maxilla. Missing posterior mandibular teeth. Percussion tender tooth # 27.No purulence, edema, fluctuance.  Panorex: Edentulous maxilla. 1.5 cm x 1.0 cm radio-opaque mass at first molar area in alveolus. Radio-opaque lesions present in mandible around apex of teeth # 20, 21, 28. Possible radiolucent lesion mesial to tooth # 27 root.   Assessment: Orocutaneous fistulous tract right mandible, unsure of etiology. 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  Plan:  CT maxillofacial no contrast.     Georgia Lopes, DMD  November 09, 2023

## 2023-11-13 ENCOUNTER — Encounter: Payer: Self-pay | Admitting: Podiatry

## 2023-11-14 IMAGING — CR DG CHEST 2V
2 series · 2 of 2 positions shown · non-contrast
Comparison: Chest x-ray 12/14/2021

CLINICAL DATA: Status post partial lung lobectomy

EXAM:
CHEST - 2 VIEW

[chest pa]
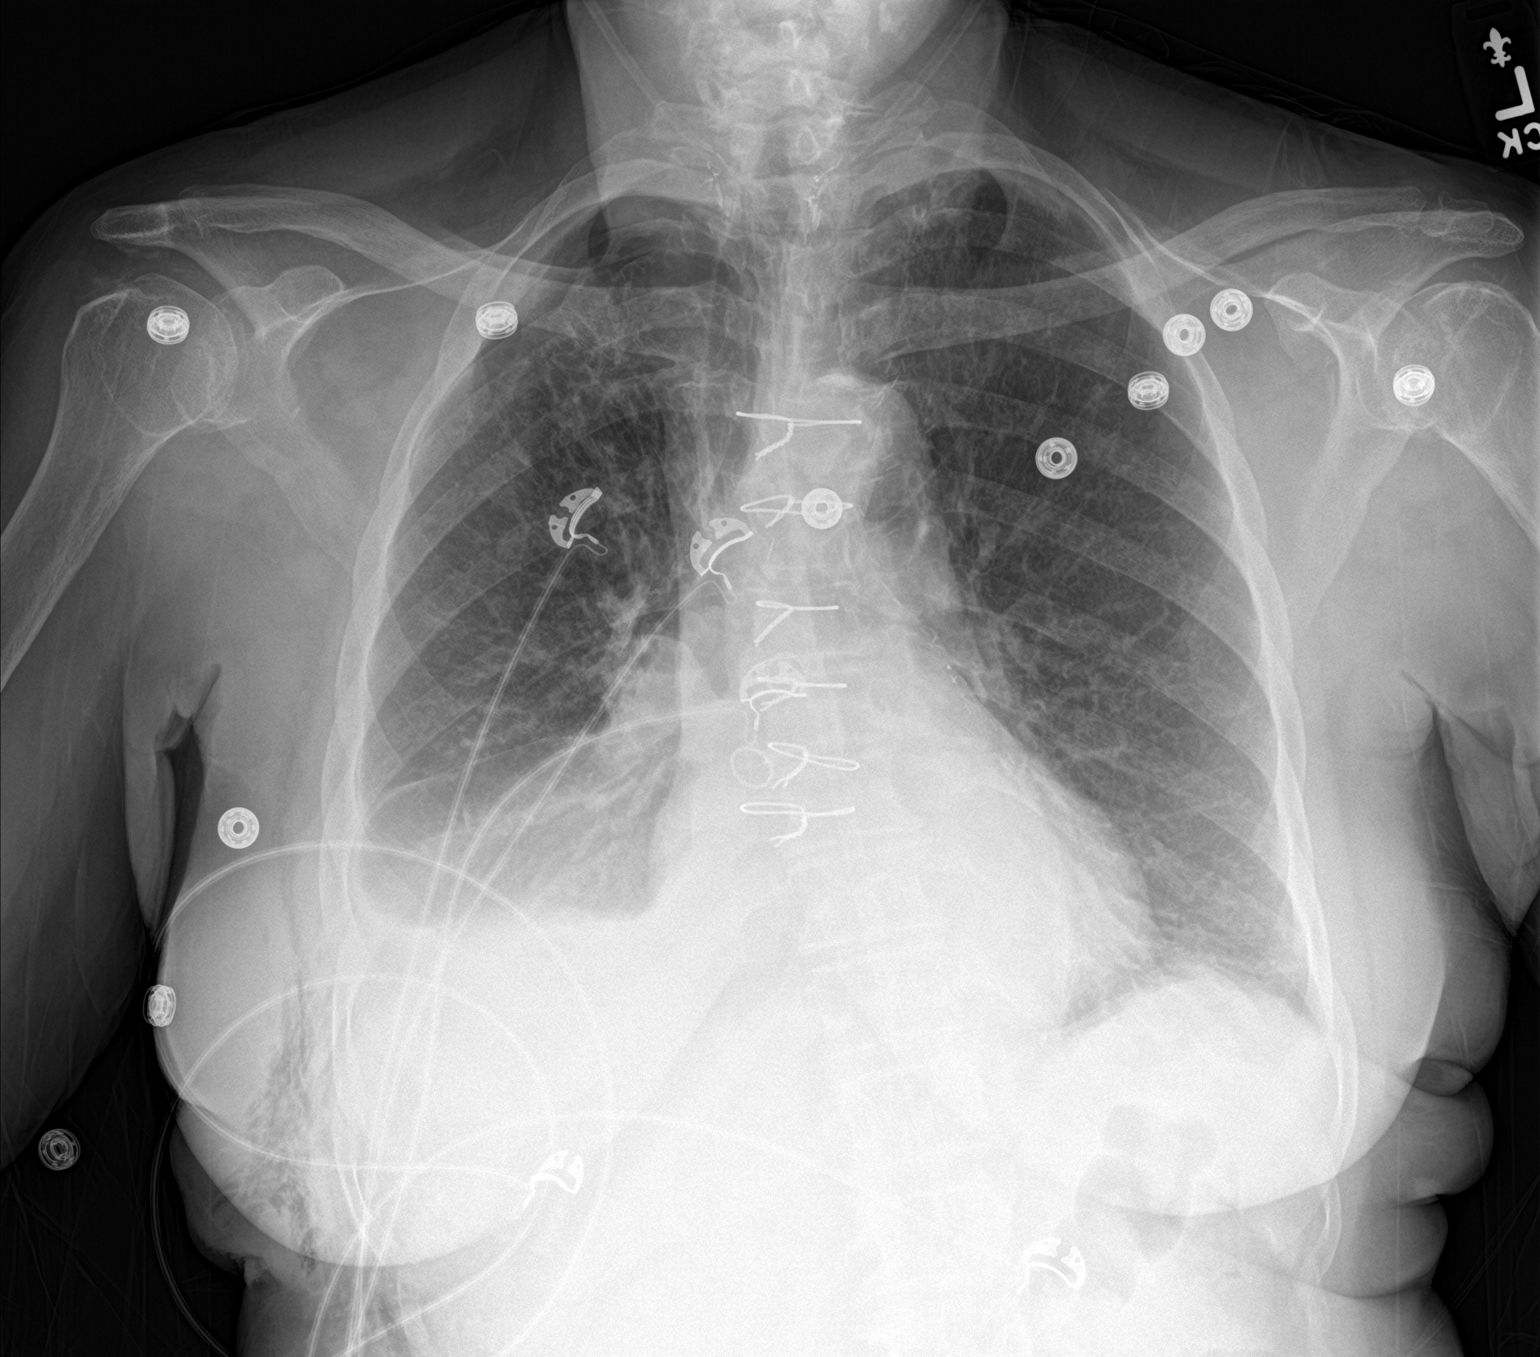

[chest lat]
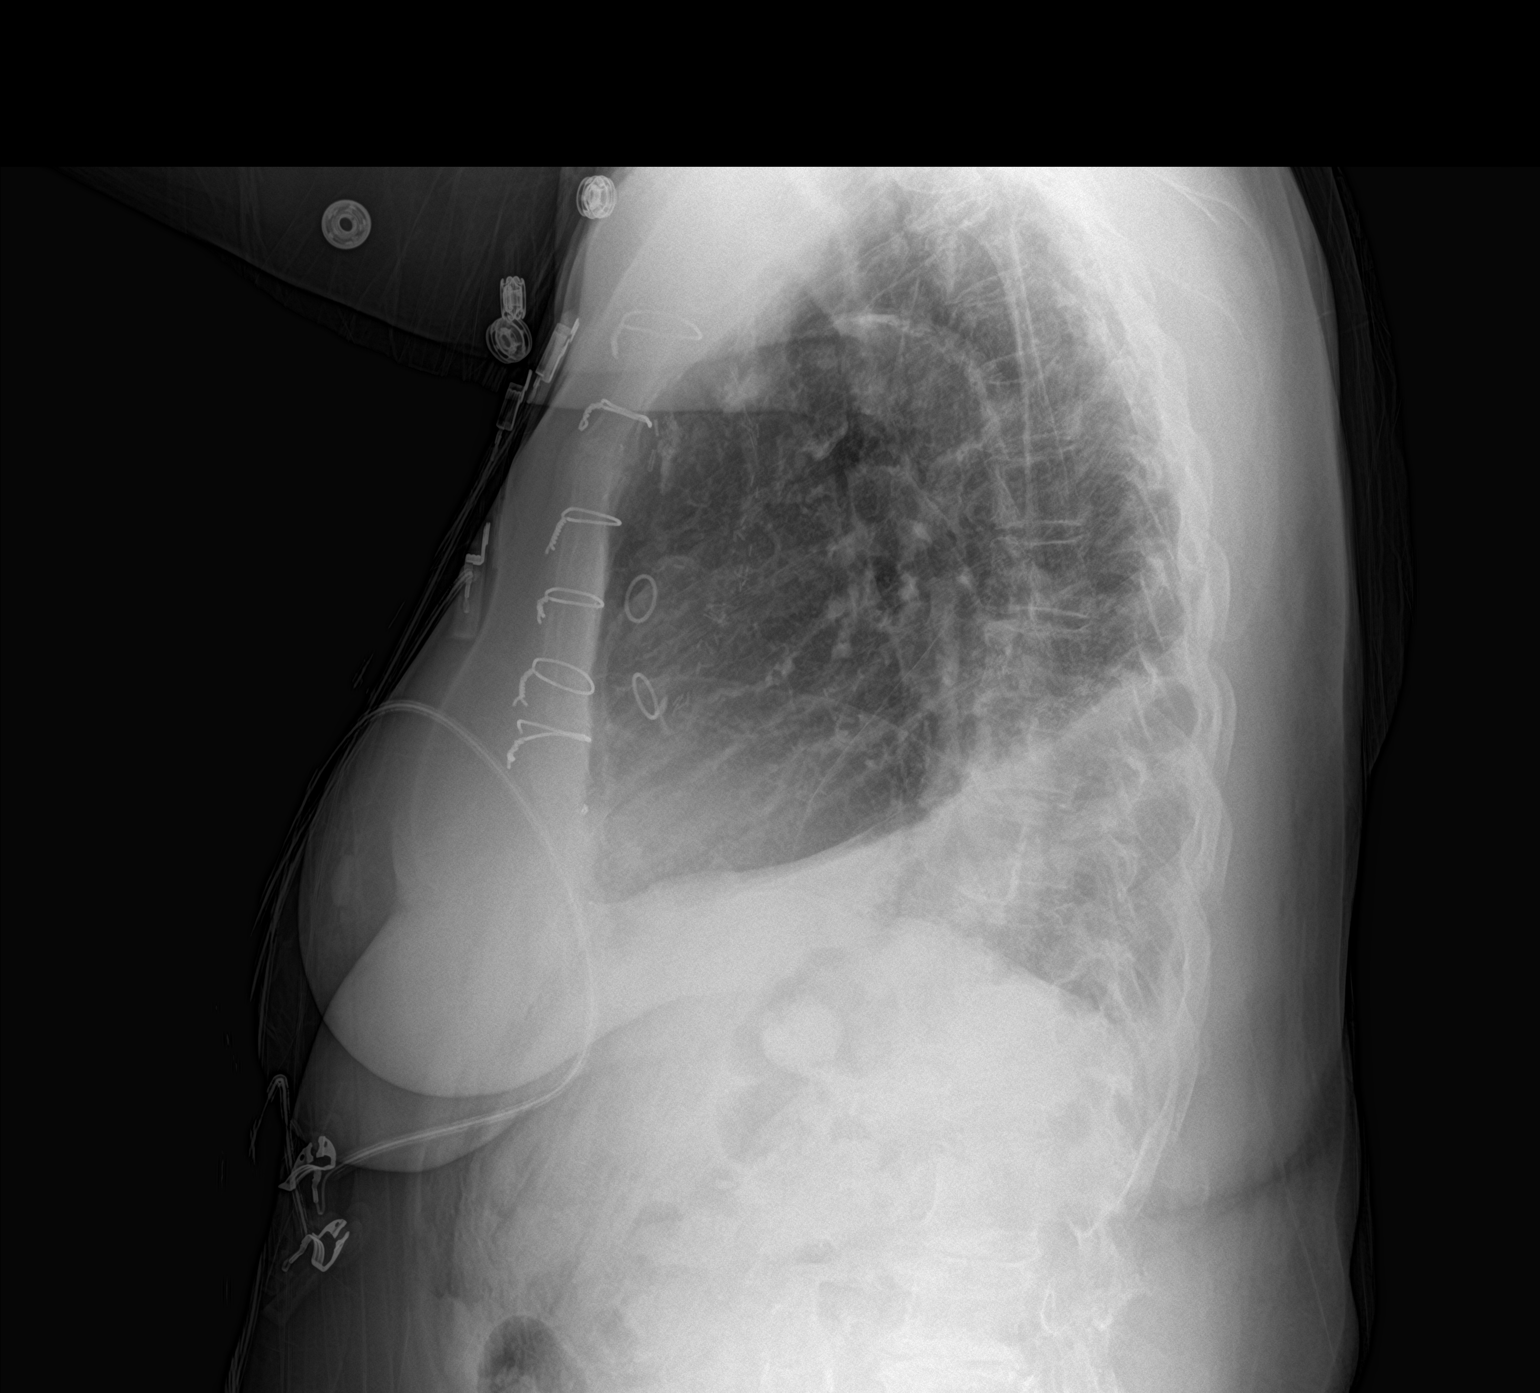

[2 of 2 positions shown; findings below may reference images not displayed]

FINDINGS: Patient is status post median sternotomy with evidence of prior
multivessel CABG. Surgical changes of partial right lower lobectomy.
Evolving expected right-sided hydropneumothorax. The pneumothorax
component is improved. Slightly increased pleural effusion filling
the space at the lung base. No evidence of pulmonary edema but there
is mild vascular congestion. Linear opacities in the left lung base
favored to reflect atelectasis. Atherosclerotic calcifications in
the transverse aorta. No acute osseous abnormality. Subcutaneous
emphysema along the right lateral chest wall again noted.
IMPRESSION: 1. Improving right hydropneumothorax. Decreased right apical
pneumothorax component and slightly increased pleural effusion
component filling the space at the lung base.
2. Mild vascular congestion without overt edema.
3. Left basilar atelectasis.

## 2023-11-24 ENCOUNTER — Ambulatory Visit (HOSPITAL_COMMUNITY)
Admission: RE | Admit: 2023-11-24 | Discharge: 2023-11-24 | Disposition: A | Discharge status: Home / Self Care | Source: Ambulatory Visit | Attending: Oral Surgery | Admitting: Oral Surgery

## 2023-11-24 DIAGNOSIS — D492 Neoplasm of unspecified behavior of bone, soft tissue, and skin: Secondary | ICD-10-CM | POA: Diagnosis present

## 2023-11-24 DIAGNOSIS — K122 Cellulitis and abscess of mouth: Secondary | ICD-10-CM | POA: Insufficient documentation

## 2023-11-28 ENCOUNTER — Encounter: Payer: Self-pay | Admitting: Cardiovascular Disease

## 2023-11-28 ENCOUNTER — Ambulatory Visit: Payer: Medicare (Managed Care) | Attending: Cardiovascular Disease | Admitting: Cardiovascular Disease

## 2023-11-28 VITALS — BP 132/60 | HR 82 | Ht 63.0 in | Wt 125.0 lb

## 2023-11-28 DIAGNOSIS — I739 Peripheral vascular disease, unspecified: Secondary | ICD-10-CM

## 2023-11-28 LAB — CBC WITH DIFFERENTIAL/PLATELET

## 2023-11-28 NOTE — H&P (View-Only) (Signed)
 11/28/2023 [name]   03/08/1946  [id number]  Primary Physician Hillery Aldo, NP Primary Cardiologist: Runell Gess MD Nicholes Calamity, [place]  HPI:  [name] is a 78 y.o.   African American female patient of Dr. Ludwig Clarks referred for peripheral vascular evaluation. She was seen by Dr. Darrelyn Hillock  orthopedic surgeon, who referred her here. I last saw her in the office 08/30/2023. Her past history is remarkable for ischemic heart disease status post coronary bypass grafting in March 2012 of the LIMA to LAD, vein to the diagonal branch, obtuse marginal branch and a vein to acute marginal branch. She has normal LV function by 2-D echo. Further problems include hypertension, and hyperlipidemia. She complains of right lower extremity claudication and had seen Dr. Kirke Corin in the past who thought that her pain was arthritic in nature. Lower extremity arterial Doppler studies performed 07/07/14 revealed a right ABI 0.43 with an occluded distal right SFA and popliteal artery, left ABI 0.73 with a high-frequency signal in the mid left SFA. I performed angiography on her 753/15 revealing an occluded left SFA with one vessel runoff via the peroneal. I performed [employer]bo[employer] directional atherectomy followed by PTA using drug-eluting balloon with excellent angiographic result. Her followup Doppler revealed an increase in her right ABI from 0.4 to .7 with resolution of her claudication symptoms. Since I saw her over 2 years ago she developed claudication in her left leg now. I suspected that her previously  documented lesions in her left external leg artery and SFA have progressed. She underwent angiography by myself 06/07/16 revealing 80% segmental mid left SFA stenosis. I performed [facility] 1 directional atherectomy followed by drug-eluting balloon into plasty. She will vessel runoff via peroneal. She did have 50% distal left common iliac artery stenosis without a gradient. Her follow-up lower extremity  Doppler studies performed 06/24/16 were markedly improved ABI 1.04.   Because of right lower extremity claudication and markedly reduced right ABI of 0.46 she underwent angiography by myself on 02/06/2018 really 90% distal right common femoral artery stenosis, total occlusion in the distal right SFA with one-vessel runoff via peroneal.  I performed directional atherectomy followed by drug-eluting balloon angioplasty of the distal SFA occlusion and chocolate balloon angioplasty of the distal right common femoral artery.  She had follow-up Doppler studies performed in our office 08/25/2017 revealed marked improvement with a right ABI of 0.82.  Her claudication has  markedly improved.   When I last angiogram to her in 2019 her left SFA was widely patent and she had one-vessel runoff via peroneal artery.  Her most recent Doppler studies performed 08/26/2023 revealed a right ABI of 0.86 with a patent right SFA and a left ABI of 0.63 with an occluded left SFA which explains her progressive symptoms  Since I saw her 3 months ago I did begin her on Pletal empirically which afforded her no clinical benefit.  She returns today with continued discomfort in her left calf which is lifestyle limiting and wishes to proceed with outpatient peripheral angiography and endovascular therapy.   Current Meds  Medication Sig   albuterol (VENTOLIN HFA) 108 (90 Base) MCG/ACT inhaler    amLODipine (NORVASC) 10 MG tablet TAKE ONE TABLET BY MOUTH ONCE DAILY   aspirin EC 81 MG tablet Take 81 mg by mouth daily.   Calcium Carb-Cholecalciferol (CALCIUM + D3) 600-800 MG-UNIT TABS Take 1 tablet by mouth 2 (two) times daily.   cilostazol (PLETAL) 50 MG tablet Take 1  tablet (50 mg total) by mouth 2 (two) times daily.   cyclobenzaprine (FLEXERIL) 10 MG tablet Take 10 mg by mouth at bedtime as needed for muscle spasms.   diclofenac Sodium (VOLTAREN) 1 % GEL daily as needed.   ezetimibe (ZETIA) 10 MG tablet Take 10 mg by mouth daily.    fluticasone (FLONASE) 50 MCG/ACT nasal spray USE 1 TO 2 SPRAY(S) IN EACH NOSTRIL ONCE DAILY AS NEEDED FOR 30 DAYS   Lancets (ONETOUCH DELICA PLUS LANCET33G) MISC 1 each by Other route 2 (two) times daily.   levothyroxine (SYNTHROID) 100 MCG tablet Take 100 mcg by mouth daily before breakfast.   lisinopril (ZESTRIL) 2.5 MG tablet Take 2.5 mg by mouth daily.   Multiple Vitamin (MULTIVITAMIN WITH MINERALS) TABS tablet Take 1 tablet by mouth every morning. Centrum   ONETOUCH VERIO test strip 1 each by Other route 2 (two) times daily.   pantoprazole (PROTONIX) 40 MG tablet Take 1 tablet (40 mg total) by mouth daily before breakfast.   polyethylene glycol (MIRALAX) 17 g packet Take 17 g by mouth daily.   Propylene Glycol 0.6 % SOLN Place 1 drop into both eyes every 4 (four) hours as needed (dry eyes).   Vitamin D, Ergocalciferol, (DRISDOL) 1.25 MG (50000 UNIT) CAPS capsule Take 50,000 Units by mouth once a week.   Current Facility-Administered Medications for the 11/28/23 encounter (Office Visit) with Runell Gess, MD  Medication   0.9 %  sodium chloride infusion     Allergies  Allergen Reactions   Atorvastatin Other (See Comments) and Itching    Muscle aches  Other Reaction(s): Other (See Comments), Other (See Comments)  Muscle aches  Muscle aches  Other reaction(s): Other (See Comments)  Muscle aches  Muscle aches  Other reaction(s): Other (See Comments)  Muscle aches  Muscle aches, Muscle aches, Other reaction(s): Other (See Comments), Muscle aches    Social History   Socioeconomic History   Marital status: Divorced    Spouse name: Not on file   Number of children: 2   Years of education: 14   Highest education level: Not on file  Occupational History   Occupation: Retired    Associate Professor: RETIRED   Occupation: retired  Tobacco Use   Smoking status: Former    Current packs/day: 0.00    Average packs/day: 0.8 packs/day for 45.0 years (36.0 ttl pk-yrs)    Types: Cigarettes     Start date: 08/23/1964    Quit date: 08/23/2009    Years since quitting: 14.2   Smokeless tobacco: Never  Vaping Use   Vaping status: Never Used  Substance and Sexual Activity   Alcohol use: Not Currently    Alcohol/week: 2.0 standard drinks of alcohol    Types: 2 Glasses of wine per week   Drug use: Never   Sexual activity: Not Currently  Other Topics Concern   Not on file  Social History Narrative   Not on file   Social Drivers of Health   Financial Resource Strain: Not on file  Food Insecurity: Not on file  Transportation Needs: Not on file  Physical Activity: Not on file  Stress: Not on file  Social Connections: Not on file  Intimate Partner Violence: Not on file     Review of Systems: General: negative for chills, fever, night sweats or weight changes.  Cardiovascular: negative for chest pain, dyspnea on exertion, edema, orthopnea, palpitations, paroxysmal nocturnal dyspnea or shortness of breath Dermatological: negative for rash Respiratory: negative for cough or  wheezing Urologic: negative for hematuria Abdominal: negative for nausea, vomiting, diarrhea, bright red blood per rectum, melena, or hematemesis Neurologic: negative for visual changes, syncope, or dizziness All other systems reviewed and are otherwise negative except as noted above.    Blood pressure 132/60, pulse 82, height 5\' 3"  (1.6 m), weight 125 lb (56.7 kg), SpO2 94%.  General appearance: alert and no distress Neck: no adenopathy, no JVD, supple, symmetrical, trachea midline, thyroid not enlarged, symmetric, no tenderness/mass/nodules, and left carotid bruit Lungs: clear to auscultation bilaterally Heart: regular rate and rhythm, S1, S2 normal, no murmur, click, rub or gallop Extremities: extremities normal, atraumatic, no cyanosis or edema Pulses: Diminished pedal pulses Skin: Skin color, texture, turgor normal. No rashes or lesions Neurologic: Grossly normal  EKG not performed today       ASSESSMENT AND PLAN:   PAD (peripheral artery disease) (HCC) Ms. Harries returns today for follow-up.  I last saw her in the office 3 months ago and began her on Pletal which afforded her no clinical benefit.  She has had intervention on her right and left SFA in 2017 and 19.  She has known one-vessel runoff bilaterally via peroneal.  Her most recent Doppler studies performed 08/26/2023 revealed a right ABI of 0.84 and a left of 0.68.  She did have a moderately elevated frequency and her right SFA with an occluded mid left SFA.  She wishes to proceed with outpatient peripheral angiography and endovascular therapy for lifestyle and claudication.     Runell Gess MD FACP,FACC,FAHA, [facility] 11/28/2023 8:24 AM

## 2023-11-28 NOTE — Patient Instructions (Addendum)
 Medication Instructions:  Your physician has recommended you make the following change in your medication:   -Stop taking cilostazol (pletal).  *If you need a refill on your cardiac medications before your next appointment, please call your pharmacy*  Lab Work: Your physician recommends that you have labs drawn today: BMET & CBC  If you have labs (blood work) drawn today and your tests are completely normal, you will receive your results only by: MyChart Message (if you have MyChart) OR A paper copy in the mail If you have any lab test that is abnormal or we need to change your treatment, we will call you to review the results.  Testing/Procedures: Your physician has requested that you have a lower extremity arterial duplex. During this test, ultrasound is used to evaluate arterial blood flow in the legs. Allow one hour for this exam. There are no restrictions or special instructions. **To do 1-2 weeks after your procedure (4/24)**  Please note: We ask at that you not bring children with you during ultrasound (echo/ vascular) testing. Due to room size and safety concerns, children are not allowed in the ultrasound rooms during exams. Our front office staff cannot provide observation of children in our lobby area while testing is being conducted. An adult accompanying a patient to their appointment will only be allowed in the ultrasound room at the discretion of the ultrasound technician under special circumstances. We apologize for any inconvenience.   Your physician has requested that you have an ankle brachial index (ABI). During this test an ultrasound and blood pressure cuff are used to evaluate the arteries that supply the arms and legs with blood. Allow thirty minutes for this exam. There are no restrictions or special instructions. **To do 1-2 weeks after your procedure (4/24)**   Please note: We ask at that you not bring children with you during ultrasound (echo/ vascular) testing.  Due to room size and safety concerns, children are not allowed in the ultrasound rooms during exams. Our front office staff cannot provide observation of children in our lobby area while testing is being conducted. An adult accompanying a patient to their appointment will only be allowed in the ultrasound room at the discretion of the ultrasound technician under special circumstances. We apologize for any inconvenience.   Follow-Up: At [facility], you and your health needs are our priority.  As part of our continuing mission to provide you with exceptional heart care, our providers are all part of one team.  This team includes your primary Cardiologist (physician) and Advanced Practice Providers or APPs (Physician Assistants and Nurse Practitioners) who all work together to provide you with the care you need, when you need it.  Your next appointment:   2-3 week(s) after your procedure (4/24)  Provider:   Nanetta Batty, MD     We recommend signing up for the patient portal called "MyChart".  Sign up information is provided on this After Visit Summary.  MyChart is used to connect with patients for Virtual Visits (Telemedicine).  Patients are able to view lab/test results, encounter notes, upcoming appointments, etc.  Non-urgent messages can be sent to your provider as well.   To learn more about what you can do with MyChart, go to [email or web address].   Other Instructions       Cardiac/Peripheral Catheterization   You are scheduled for a Peripheral Angiogram on Thursday, April 24 with Dr. Nanetta Batty.  1. Please arrive at the [facility] (Main Entrance A) at [facility]: [address] [place], [place] [address] at 7:30 AM (This time is 2 hour(s) before your procedure to ensure your preparation).   Free valet parking service is available. You will check in at ADMITTING. The support person will be asked to wait in the waiting room.  It is OK to have someone drop you  off and come back when you are ready to be discharged.        Special note: Every effort is made to have your procedure done on time. Please understand that emergencies sometimes delay scheduled procedures.  2. Diet: Do not eat solid foods after midnight.  You may have clear liquids until 5 AM the day of the procedure.  3. Labs: You will need to have blood drawn today (4/7).  4. Medication instructions in preparation for your procedure:    On the morning of your procedure, take Aspirin 81 mg and any morning medicines NOT listed above.  You may use sips of water.  5. Plan to go home the same day, you will only stay overnight if medically necessary. 6. You MUST have a responsible adult to drive you home. 7. An adult MUST be with you the first 24 hours after you arrive home. 8. Bring a current list of your medications, and the last time and date medication taken. 9. Bring ID and current insurance cards. 10.Please wear clothes that are easy to get on and off and wear slip-on shoes.  Thank you for allowing [place] to care for you!   -- [facility]        1st Floor: - Lobby - Registration  - Pharmacy  - Lab - Cafe  2nd Floor: - PV Lab - Diagnostic Testing (echo, CT, nuclear med)  3rd Floor: - Vacant  4th Floor: - TCTS (cardiothoracic surgery) - AFib Clinic - Structural Heart Clinic - Vascular Surgery  - Vascular Ultrasound  5th Floor: - HeartCare Cardiology (general and EP) - Clinical Pharmacy for coumadin, hypertension, lipid, weight-loss medications, and med management appointments    Valet parking services will be available as well.

## 2023-11-28 NOTE — Progress Notes (Signed)
 11/28/2023 [name]   03/08/1946  [id number]  Primary Physician Hillery Aldo, NP Primary Cardiologist: Runell Gess MD Nicholes Calamity, [place]  HPI:  [name] is a 78 y.o.   African American female patient of Dr. Ludwig Clarks referred for peripheral vascular evaluation. She was seen by Dr. Darrelyn Hillock  orthopedic surgeon, who referred her here. I last saw her in the office 08/30/2023. Her past history is remarkable for ischemic heart disease status post coronary bypass grafting in March 2012 of the LIMA to LAD, vein to the diagonal branch, obtuse marginal branch and a vein to acute marginal branch. She has normal LV function by 2-D echo. Further problems include hypertension, and hyperlipidemia. She complains of right lower extremity claudication and had seen Dr. Kirke Corin in the past who thought that her pain was arthritic in nature. Lower extremity arterial Doppler studies performed 07/07/14 revealed a right ABI 0.43 with an occluded distal right SFA and popliteal artery, left ABI 0.73 with a high-frequency signal in the mid left SFA. I performed angiography on her 753/15 revealing an occluded left SFA with one vessel runoff via the peroneal. I performed [employer] directional atherectomy followed by PTA using drug-eluting balloon with excellent angiographic result. Her followup Doppler revealed an increase in her right ABI from 0.4 to .7 with resolution of her claudication symptoms. Since I saw her over 2 years ago she developed claudication in her left leg now. I suspected that her previously  documented lesions in her left external leg artery and SFA have progressed. She underwent angiography by myself 06/07/16 revealing 80% segmental mid left SFA stenosis. I performed [facility] 1 directional atherectomy followed by drug-eluting balloon into plasty. She will vessel runoff via peroneal. She did have 50% distal left common iliac artery stenosis without a gradient. Her follow-up lower extremity  Doppler studies performed 06/24/16 were markedly improved ABI 1.04.   Because of right lower extremity claudication and markedly reduced right ABI of 0.46 she underwent angiography by myself on 02/06/2018 really 90% distal right common femoral artery stenosis, total occlusion in the distal right SFA with one-vessel runoff via peroneal.  I performed directional atherectomy followed by drug-eluting balloon angioplasty of the distal SFA occlusion and chocolate balloon angioplasty of the distal right common femoral artery.  She had follow-up Doppler studies performed in our office 08/25/2017 revealed marked improvement with a right ABI of 0.82.  Her claudication has  markedly improved.   When I last angiogram to her in 2019 her left SFA was widely patent and she had one-vessel runoff via peroneal artery.  Her most recent Doppler studies performed 08/26/2023 revealed a right ABI of 0.86 with a patent right SFA and a left ABI of 0.63 with an occluded left SFA which explains her progressive symptoms  Since I saw her 3 months ago I did begin her on Pletal empirically which afforded her no clinical benefit.  She returns today with continued discomfort in her left calf which is lifestyle limiting and wishes to proceed with outpatient peripheral angiography and endovascular therapy.   Current Meds  Medication Sig   albuterol (VENTOLIN HFA) 108 (90 Base) MCG/ACT inhaler    amLODipine (NORVASC) 10 MG tablet TAKE ONE TABLET BY MOUTH ONCE DAILY   aspirin EC 81 MG tablet Take 81 mg by mouth daily.   Calcium Carb-Cholecalciferol (CALCIUM + D3) 600-800 MG-UNIT TABS Take 1 tablet by mouth 2 (two) times daily.   cilostazol (PLETAL) 50 MG tablet Take 1  tablet (50 mg total) by mouth 2 (two) times daily.   cyclobenzaprine (FLEXERIL) 10 MG tablet Take 10 mg by mouth at bedtime as needed for muscle spasms.   diclofenac Sodium (VOLTAREN) 1 % GEL daily as needed.   ezetimibe (ZETIA) 10 MG tablet Take 10 mg by mouth daily.    fluticasone (FLONASE) 50 MCG/ACT nasal spray USE 1 TO 2 SPRAY(S) IN EACH NOSTRIL ONCE DAILY AS NEEDED FOR 30 DAYS   Lancets (ONETOUCH DELICA PLUS LANCET33G) MISC 1 each by Other route 2 (two) times daily.   levothyroxine (SYNTHROID) 100 MCG tablet Take 100 mcg by mouth daily before breakfast.   lisinopril (ZESTRIL) 2.5 MG tablet Take 2.5 mg by mouth daily.   Multiple Vitamin (MULTIVITAMIN WITH MINERALS) TABS tablet Take 1 tablet by mouth every morning. Centrum   ONETOUCH VERIO test strip 1 each by Other route 2 (two) times daily.   pantoprazole (PROTONIX) 40 MG tablet Take 1 tablet (40 mg total) by mouth daily before breakfast.   polyethylene glycol (MIRALAX) 17 g packet Take 17 g by mouth daily.   Propylene Glycol 0.6 % SOLN Place 1 drop into both eyes every 4 (four) hours as needed (dry eyes).   Vitamin D, Ergocalciferol, (DRISDOL) 1.25 MG (50000 UNIT) CAPS capsule Take 50,000 Units by mouth once a week.   Current Facility-Administered Medications for the 11/28/23 encounter (Office Visit) with Runell Gess, MD  Medication   0.9 %  sodium chloride infusion     Allergies  Allergen Reactions   Atorvastatin Other (See Comments) and Itching    Muscle aches  Other Reaction(s): Other (See Comments), Other (See Comments)  Muscle aches  Muscle aches  Other reaction(s): Other (See Comments)  Muscle aches  Muscle aches  Other reaction(s): Other (See Comments)  Muscle aches  Muscle aches, Muscle aches, Other reaction(s): Other (See Comments), Muscle aches    Social History   Socioeconomic History   Marital status: Divorced    Spouse name: Not on file   Number of children: 2   Years of education: 14   Highest education level: Not on file  Occupational History   Occupation: Retired    Associate Professor: RETIRED   Occupation: retired  Tobacco Use   Smoking status: Former    Current packs/day: 0.00    Average packs/day: 0.8 packs/day for 45.0 years (36.0 ttl pk-yrs)    Types: Cigarettes     Start date: 08/23/1964    Quit date: 08/23/2009    Years since quitting: 14.2   Smokeless tobacco: Never  Vaping Use   Vaping status: Never Used  Substance and Sexual Activity   Alcohol use: Not Currently    Alcohol/week: 2.0 standard drinks of alcohol    Types: 2 Glasses of wine per week   Drug use: Never   Sexual activity: Not Currently  Other Topics Concern   Not on file  Social History Narrative   Not on file   Social Drivers of Health   Financial Resource Strain: Not on file  Food Insecurity: Not on file  Transportation Needs: Not on file  Physical Activity: Not on file  Stress: Not on file  Social Connections: Not on file  Intimate Partner Violence: Not on file     Review of Systems: General: negative for chills, fever, night sweats or weight changes.  Cardiovascular: negative for chest pain, dyspnea on exertion, edema, orthopnea, palpitations, paroxysmal nocturnal dyspnea or shortness of breath Dermatological: negative for rash Respiratory: negative for cough or  wheezing Urologic: negative for hematuria Abdominal: negative for nausea, vomiting, diarrhea, bright red blood per rectum, melena, or hematemesis Neurologic: negative for visual changes, syncope, or dizziness All other systems reviewed and are otherwise negative except as noted above.    Blood pressure 132/60, pulse 82, height 5\' 3"  (1.6 m), weight 125 lb (56.7 kg), SpO2 94%.  General appearance: alert and no distress Neck: no adenopathy, no JVD, supple, symmetrical, trachea midline, thyroid not enlarged, symmetric, no tenderness/mass/nodules, and left carotid bruit Lungs: clear to auscultation bilaterally Heart: regular rate and rhythm, S1, S2 normal, no murmur, click, rub or gallop Extremities: extremities normal, atraumatic, no cyanosis or edema Pulses: Diminished pedal pulses Skin: Skin color, texture, turgor normal. No rashes or lesions Neurologic: Grossly normal  EKG not performed today       ASSESSMENT AND PLAN:   PAD (peripheral artery disease) (HCC) Ms. Harries returns today for follow-up.  I last saw her in the office 3 months ago and began her on Pletal which afforded her no clinical benefit.  She has had intervention on her right and left SFA in 2017 and 19.  She has known one-vessel runoff bilaterally via peroneal.  Her most recent Doppler studies performed 08/26/2023 revealed a right ABI of 0.84 and a left of 0.68.  She did have a moderately elevated frequency and her right SFA with an occluded mid left SFA.  She wishes to proceed with outpatient peripheral angiography and endovascular therapy for lifestyle and claudication.     Runell Gess MD FACP,FACC,FAHA, [facility] 11/28/2023 8:24 AM

## 2023-11-28 NOTE — Assessment & Plan Note (Signed)
 Ms. Veldhuizen returns today for follow-up.  I last saw her in the office 3 months ago and began her on Pletal which afforded her no clinical benefit.  She has had intervention on her right and left SFA in 2017 and 19.  She has known one-vessel runoff bilaterally via peroneal.  Her most recent Doppler studies performed 08/26/2023 revealed a right ABI of 0.84 and a left of 0.68.  She did have a moderately elevated frequency and her right SFA with an occluded mid left SFA.  She wishes to proceed with outpatient peripheral angiography and endovascular therapy for lifestyle and claudication.

## 2023-11-29 ENCOUNTER — Encounter: Payer: Self-pay | Admitting: *Deleted

## 2023-11-29 LAB — CBC WITH DIFFERENTIAL/PLATELET
Basos: 1 %
EOS (ABSOLUTE): 0 10*3/uL (ref 0.0–0.2)
Eos: 5 %
Hematocrit: 39.2 % (ref 34.0–46.6)
Hemoglobin: 13 g/dL (ref 11.1–15.9)
Immature Granulocytes: 0 %
Immature Granulocytes: 0 10*3/uL (ref 0.0–0.1)
Lymphs: 35 %
MCH: 29.3 pg (ref 26.6–33.0)
MCHC: 33.2 g/dL (ref 31.5–35.7)
MCV: 89 fL (ref 79–97)
Monocytes Absolute: 0.2 10*3/uL (ref 0.0–0.4)
Monocytes Absolute: 0.4 10*3/uL (ref 0.1–0.9)
Monocytes: 8 %
Neutrophils Absolute: 1.7 10*3/uL (ref 0.7–3.1)
Neutrophils Absolute: 2.5 10*3/uL (ref 1.4–7.0)
Neutrophils: 51 %
Platelets: 262 10*3/uL (ref 150–450)
RBC: 4.43 x10E6/uL (ref 3.77–5.28)
RDW: 13.7 % (ref 11.7–15.4)
WBC: 4.8 10*3/uL (ref 3.4–10.8)

## 2023-11-29 LAB — BASIC METABOLIC PANEL WITH GFR
BUN/Creatinine Ratio: 18 (ref 12–28)
BUN: 12 mg/dL (ref 8–27)
CO2: 19 mmol/L — ABNORMAL LOW (ref 20–29)
Calcium: 9.4 mg/dL (ref 8.7–10.3)
Chloride: 105 mmol/L (ref 96–106)
Creatinine, Ser: 0.65 mg/dL (ref 0.57–1.00)
Glucose: 110 mg/dL — ABNORMAL HIGH (ref 70–99)
Potassium: 4.7 mmol/L (ref 3.5–5.2)
Sodium: 144 mmol/L (ref 134–144)
eGFR: 90 mL/min/{1.73_m2} (ref >59)

## 2023-12-07 ENCOUNTER — Other Ambulatory Visit: Payer: Self-pay | Admitting: Cardiovascular Disease

## 2023-12-07 DIAGNOSIS — I739 Peripheral vascular disease, unspecified: Secondary | ICD-10-CM

## 2023-12-13 ENCOUNTER — Telehealth: Payer: Self-pay | Admitting: *Deleted

## 2023-12-13 NOTE — Telephone Encounter (Signed)
 LEA scheduled at [facility] for: Thursday December 15, 2023 9:30 AM Arrival time [facility] Main Entrance A at: 7:30 AM  Nothing to eat after midnight prior to procedure, clear liquids until 5 AM day of procedure.  Medication instructions: -Usual morning medications can be taken with sips of water  including aspirin  81 mg.  Plan to go home the same day, you will only stay overnight if medically necessary.  You must have responsible adult to drive you home.  Someone must be with you the first 24 hours after you arrive home.  Reviewed procedure instructions with patient.

## 2023-12-15 ENCOUNTER — Encounter (HOSPITAL_COMMUNITY)
Admission: RE | Disposition: A | Discharge status: Home / Self Care | Payer: Self-pay | Source: Home / Self Care | Attending: Cardiovascular Disease

## 2023-12-15 ENCOUNTER — Other Ambulatory Visit

## 2023-12-15 ENCOUNTER — Other Ambulatory Visit: Payer: Self-pay

## 2023-12-15 ENCOUNTER — Ambulatory Visit (HOSPITAL_COMMUNITY)
Admission: RE | Admit: 2023-12-15 | Discharge: 2023-12-16 | Disposition: A | Discharge status: Home / Self Care | Attending: Cardiovascular Disease | Admitting: Cardiovascular Disease

## 2023-12-15 DIAGNOSIS — Z951 Presence of aortocoronary bypass graft: Secondary | ICD-10-CM | POA: Diagnosis not present

## 2023-12-15 DIAGNOSIS — I10 Essential (primary) hypertension: Secondary | ICD-10-CM | POA: Insufficient documentation

## 2023-12-15 DIAGNOSIS — I70212 Atherosclerosis of native arteries of extremities with intermittent claudication, left leg: Secondary | ICD-10-CM | POA: Diagnosis not present

## 2023-12-15 DIAGNOSIS — I708 Atherosclerosis of other arteries: Secondary | ICD-10-CM | POA: Insufficient documentation

## 2023-12-15 DIAGNOSIS — K219 Gastro-esophageal reflux disease without esophagitis: Secondary | ICD-10-CM | POA: Insufficient documentation

## 2023-12-15 DIAGNOSIS — I739 Peripheral vascular disease, unspecified: Secondary | ICD-10-CM | POA: Insufficient documentation

## 2023-12-15 DIAGNOSIS — Z79899 Other long term (current) drug therapy: Secondary | ICD-10-CM | POA: Insufficient documentation

## 2023-12-15 DIAGNOSIS — E039 Hypothyroidism, unspecified: Secondary | ICD-10-CM | POA: Diagnosis not present

## 2023-12-15 DIAGNOSIS — I7092 Chronic total occlusion of artery of the extremities: Secondary | ICD-10-CM | POA: Diagnosis not present

## 2023-12-15 DIAGNOSIS — Z87891 Personal history of nicotine dependence: Secondary | ICD-10-CM | POA: Diagnosis not present

## 2023-12-15 DIAGNOSIS — E785 Hyperlipidemia, unspecified: Secondary | ICD-10-CM | POA: Insufficient documentation

## 2023-12-15 DIAGNOSIS — I2510 Atherosclerotic heart disease of native coronary artery without angina pectoris: Secondary | ICD-10-CM | POA: Insufficient documentation

## 2023-12-15 HISTORY — PX: LOWER EXTREMITY ANGIOGRAPHY: CATH118251

## 2023-12-15 HISTORY — PX: LOWER EXTREMITY INTERVENTION: CATH118252

## 2023-12-15 HISTORY — PX: PERIPHERAL INTRAVASCULAR LITHOTRIPSY: CATH118324

## 2023-12-15 LAB — POCT ACTIVATED CLOTTING TIME
Activated Clotting Time: 268 s
Activated Clotting Time: 262 s
Activated Clotting Time: 279 s
Activated Clotting Time: 291 s
Activated Clotting Time: 216 s
Activated Clotting Time: 176 s
Activated Clotting Time: 170 s

## 2023-12-15 LAB — GLUCOSE, CAPILLARY: Glucose-Capillary: 120 mg/dL — ABNORMAL HIGH (ref 70–99)

## 2023-12-15 SURGERY — LOWER EXTREMITY ANGIOGRAPHY
Anesthesia: LOCAL

## 2023-12-15 MED ORDER — ASPIRIN 81 MG PO TBEC: 81.0000 mg | DELAYED_RELEASE_TABLET | Freq: Every morning | ORAL | Status: DC

## 2023-12-15 MED ORDER — ONDANSETRON HCL 4 MG/2ML IJ SOLN: 4.0000 mg | Freq: Four times a day (QID) | INTRAMUSCULAR | Status: DC | PRN

## 2023-12-15 MED ORDER — CYCLOBENZAPRINE HCL 10 MG PO TABS
10.0000 mg | ORAL_TABLET | Freq: Every evening | ORAL | Status: DC | PRN
  Administered 2023-12-15: 10 mg via ORAL
  Filled 2023-12-15: qty 1

## 2023-12-15 MED ORDER — LIDOCAINE HCL (PF) 1 % IJ SOLN
INTRAMUSCULAR | Status: AC
  Filled 2023-12-15: qty 30

## 2023-12-15 MED ORDER — LEVOTHYROXINE SODIUM 88 MCG PO TABS
88.0000 ug | ORAL_TABLET | Freq: Every day | ORAL | Status: DC
  Administered 2023-12-16: 88 ug via ORAL
  Filled 2023-12-15: qty 1

## 2023-12-15 MED ORDER — FENTANYL CITRATE (PF) 100 MCG/2ML IJ SOLN
INTRAMUSCULAR | Status: AC
Start: 2023-12-15 — End: ?
  Filled 2023-12-15: qty 2

## 2023-12-15 MED ORDER — ASPIRIN 81 MG PO TBEC
81.0000 mg | DELAYED_RELEASE_TABLET | Freq: Every day | ORAL | Status: DC
  Administered 2023-12-16: 81 mg via ORAL
  Filled 2023-12-15: qty 1

## 2023-12-15 MED ORDER — FLUTICASONE PROPIONATE 50 MCG/ACT NA SUSP: 1.0000 | Freq: Every day | NASAL | Status: DC | PRN

## 2023-12-15 MED ORDER — HEPARIN SODIUM (PORCINE) 1000 UNIT/ML IJ SOLN
INTRAMUSCULAR | Status: AC
  Filled 2023-12-15: qty 10

## 2023-12-15 MED ORDER — LABETALOL HCL 5 MG/ML IV SOLN: 10.0000 mg | INTRAVENOUS | Status: DC | PRN

## 2023-12-15 MED ORDER — PANTOPRAZOLE SODIUM 40 MG PO TBEC
40.0000 mg | DELAYED_RELEASE_TABLET | Freq: Every day | ORAL | Status: DC
  Administered 2023-12-16: 40 mg via ORAL
  Filled 2023-12-15: qty 1

## 2023-12-15 MED ORDER — MORPHINE SULFATE (PF) 2 MG/ML IV SOLN: 2.0000 mg | INTRAVENOUS | Status: DC | PRN

## 2023-12-15 MED ORDER — FENTANYL CITRATE (PF) 100 MCG/2ML IJ SOLN
INTRAMUSCULAR | Status: DC | PRN
  Administered 2023-12-15: 25 ug via INTRAVENOUS

## 2023-12-15 MED ORDER — CLOPIDOGREL BISULFATE 300 MG PO TABS
ORAL_TABLET | ORAL | Status: DC | PRN
  Administered 2023-12-15: 300 mg via ORAL

## 2023-12-15 MED ORDER — HYDRALAZINE HCL 20 MG/ML IJ SOLN: 5.0000 mg | INTRAMUSCULAR | Status: DC | PRN

## 2023-12-15 MED ORDER — SODIUM CHLORIDE 0.9 % WEIGHT BASED INFUSION: 3.0000 mL/kg/h | INTRAVENOUS | Status: DC

## 2023-12-15 MED ORDER — SODIUM CHLORIDE 0.9% FLUSH
3.0000 mL | Freq: Two times a day (BID) | INTRAVENOUS | Status: DC
  Administered 2023-12-15 – 2023-12-16 (×2): 3 mL via INTRAVENOUS

## 2023-12-15 MED ORDER — SODIUM CHLORIDE 0.9 % WEIGHT BASED INFUSION: 1.0000 mL/kg/h | INTRAVENOUS | Status: DC

## 2023-12-15 MED ORDER — SODIUM CHLORIDE 0.9 % IV SOLN: INTRAVENOUS | Status: AC

## 2023-12-15 MED ORDER — ACETAMINOPHEN 325 MG PO TABS
ORAL_TABLET | ORAL | Status: AC
Start: 2023-12-15 — End: 2023-12-16
  Filled 2023-12-15: qty 2

## 2023-12-15 MED ORDER — HEPARIN SODIUM (PORCINE) 1000 UNIT/ML IJ SOLN
INTRAMUSCULAR | Status: DC | PRN
  Administered 2023-12-15: 6000 [IU] via INTRAVENOUS
  Administered 2023-12-15 (×2): 2500 [IU] via INTRAVENOUS

## 2023-12-15 MED ORDER — LISINOPRIL 5 MG PO TABS
5.0000 mg | ORAL_TABLET | Freq: Every day | ORAL | Status: DC
  Administered 2023-12-16: 5 mg via ORAL
  Filled 2023-12-15: qty 1

## 2023-12-15 MED ORDER — SODIUM CHLORIDE 0.9 % IV SOLN: 250.0000 mL | INTRAVENOUS | Status: DC | PRN

## 2023-12-15 MED ORDER — ACETAMINOPHEN 325 MG PO TABS
650.0000 mg | ORAL_TABLET | ORAL | Status: DC | PRN
  Administered 2023-12-15 (×2): 650 mg via ORAL
  Filled 2023-12-15 (×2): qty 2

## 2023-12-15 MED ORDER — AMLODIPINE BESYLATE 10 MG PO TABS
10.0000 mg | ORAL_TABLET | Freq: Every day | ORAL | Status: DC
  Administered 2023-12-16: 10 mg via ORAL
  Filled 2023-12-15: qty 1

## 2023-12-15 MED ORDER — IODIXANOL 320 MG/ML IV SOLN
INTRAVENOUS | Status: DC | PRN
  Administered 2023-12-15: 85 mL via INTRA_ARTERIAL

## 2023-12-15 MED ORDER — ALBUTEROL SULFATE (2.5 MG/3ML) 0.083% IN NEBU
2.5000 mg | INHALATION_SOLUTION | Freq: Four times a day (QID) | RESPIRATORY_TRACT | Status: DC | PRN
Start: 2023-12-15 — End: 2023-12-16

## 2023-12-15 MED ORDER — EZETIMIBE 10 MG PO TABS
10.0000 mg | ORAL_TABLET | Freq: Every day | ORAL | Status: DC
  Administered 2023-12-15 – 2023-12-16 (×2): 10 mg via ORAL
  Filled 2023-12-15 (×2): qty 1

## 2023-12-15 MED ORDER — MIDAZOLAM HCL 2 MG/2ML IJ SOLN
INTRAMUSCULAR | Status: AC
  Filled 2023-12-15: qty 2

## 2023-12-15 MED ORDER — MIDAZOLAM HCL 2 MG/2ML IJ SOLN
INTRAMUSCULAR | Status: DC | PRN
  Administered 2023-12-15: 1 mg via INTRAVENOUS

## 2023-12-15 MED ORDER — CLOPIDOGREL BISULFATE 75 MG PO TABS
75.0000 mg | ORAL_TABLET | Freq: Every day | ORAL | Status: DC
  Administered 2023-12-16: 75 mg via ORAL
  Filled 2023-12-15: qty 1

## 2023-12-15 MED ORDER — HEPARIN (PORCINE) IN NACL 1000-0.9 UT/500ML-% IV SOLN
INTRAVENOUS | Status: DC | PRN
  Administered 2023-12-15 (×3): 500 mL

## 2023-12-15 MED ORDER — SODIUM CHLORIDE 0.9% FLUSH: 3.0000 mL | INTRAVENOUS | Status: DC | PRN

## 2023-12-15 MED ORDER — LIDOCAINE HCL (PF) 1 % IJ SOLN
INTRAMUSCULAR | Status: DC | PRN
  Administered 2023-12-15: 20 mL

## 2023-12-15 SURGICAL SUPPLY — 29 items
BAG SNAP BAND KOVER 36X36 (MISCELLANEOUS) IMPLANT
BALLOON ADMIRAL INPACT 5X200 (BALLOONS) IMPLANT
BALLOON COYOTE OTW 2.5X100X150 (BALLOONS) IMPLANT
BALLOON STERLING OTW 2X100X150 (BALLOONS) IMPLANT
BALLOON STERLING OTW 3X100X150 (BALLOONS) IMPLANT
CATH ANGIO 5F PIGTAIL 65CM (CATHETERS) IMPLANT
CATH CROSS OVER TEMPO 5F (CATHETERS) IMPLANT
CATH CXI SUPP ST 4FR 135CM (CATHETERS) IMPLANT
CATH SHOCKWAVE E8 5X80 (CATHETERS) IMPLANT
CATH STRAIGHT 5FR 65CM (CATHETERS) IMPLANT
CATH VIANCE CROSS STD 150CM (MICROCATHETER) IMPLANT
COVER DOME SNAP 22 D (MISCELLANEOUS) IMPLANT
DEVICE TORQUE SEADRAGON GRN (MISCELLANEOUS) IMPLANT
GUIDEWIRE ANGLED .035X150CM (WIRE) IMPLANT
KIT ENCORE 26 ADVANTAGE (KITS) IMPLANT
KIT MICROPUNCTURE NIT STIFF (SHEATH) IMPLANT
KIT SYRINGE INJ CVI SPIKEX1 (MISCELLANEOUS) IMPLANT
PACK CARDIAC CATHETERIZATION (CUSTOM PROCEDURE TRAY) IMPLANT
SET ATX-X65L (MISCELLANEOUS) IMPLANT
SHEATH CATAPULT 6F 45 MP (SHEATH) IMPLANT
SHEATH PINNACLE 5F 10CM (SHEATH) IMPLANT
SHEATH PINNACLE 6F 10CM (SHEATH) IMPLANT
SHEATH PINNACLE 7F 10CM (SHEATH) IMPLANT
SHEATH PROBE COVER 6X72 (BAG) IMPLANT
TAPE SHOOT N SEE (TAPE) IMPLANT
WIRE BENTSON .035X145CM (WIRE) IMPLANT
WIRE ROSEN-J .035X260CM (WIRE) IMPLANT
WIRE SHEPHERD 4G .014 (WIRE) IMPLANT
WIRE SHEPHERD 4G .018 (WIRE) IMPLANT

## 2023-12-15 NOTE — Progress Notes (Signed)
 Site area: Right femoral artery Site Prior to Removal:  Level 0 Pressure Applied For: Manual:  yes  Patient Status During Pull:  Stable Post Pull Site:  Level 0 Post Pull Instructions Given: Yes with teach back Post Pull Pulses Present: +1 DP/PT Dressing Applied:  Gauze with tegaderm Bedrest begins @ 1652 for 6hrs Comments: 7Fr sheath removed at 1617, pressured held for ~14min. Bedrest to begin at 1652. Site Level 0

## 2023-12-15 NOTE — Interval H&P Note (Signed)
 History and Physical Interval Note:  12/15/2023 9:11 AM  [name]  has presented today for surgery, with the diagnosis of PAD.  The various methods of treatment have been discussed with the patient and family. After consideration of risks, benefits and other options for treatment, the patient has consented to  Procedure(s): Lower Extremity Angiography (N/A) as a surgical intervention.  The patient's history has been reviewed, patient examined, no change in status, stable for surgery.  I have reviewed the patient's chart and labs.  Questions were answered to the patient's satisfaction.     Lauro Portal

## 2023-12-16 ENCOUNTER — Other Ambulatory Visit (HOSPITAL_COMMUNITY): Payer: Self-pay

## 2023-12-16 ENCOUNTER — Encounter (HOSPITAL_COMMUNITY): Payer: Self-pay | Admitting: Cardiovascular Disease

## 2023-12-16 ENCOUNTER — Telehealth: Payer: Self-pay | Admitting: Podiatry

## 2023-12-16 DIAGNOSIS — I739 Peripheral vascular disease, unspecified: Secondary | ICD-10-CM | POA: Diagnosis not present

## 2023-12-16 LAB — CBC
HCT: 34 % — ABNORMAL LOW (ref 36.0–46.0)
Hemoglobin: 11.3 g/dL — ABNORMAL LOW (ref 12.0–15.0)
MCH: 29.5 pg (ref 26.0–34.0)
MCHC: 33.2 g/dL (ref 30.0–36.0)
MCV: 88.8 fL (ref 80.0–100.0)
Platelets: 203 10*3/uL (ref 150–400)
RBC: 3.83 MIL/uL — ABNORMAL LOW (ref 3.87–5.11)
RDW: 13.7 % (ref 11.5–15.5)
WBC: 4.8 10*3/uL (ref 4.0–10.5)
nRBC: 0 % (ref 0.0–0.2)

## 2023-12-16 LAB — BASIC METABOLIC PANEL WITH GFR
Anion gap: 9 (ref 5–15)
BUN: 12 mg/dL (ref 8–23)
CO2: 22 mmol/L (ref 22–32)
Calcium: 8.4 mg/dL — ABNORMAL LOW (ref 8.9–10.3)
Chloride: 110 mmol/L (ref 98–111)
Creatinine, Ser: 0.57 mg/dL (ref 0.44–1.00)
GFR, Estimated: >60 mL/min (ref >60)
Glucose, Bld: 101 mg/dL — ABNORMAL HIGH (ref 70–99)
Potassium: 3.3 mmol/L — ABNORMAL LOW (ref 3.5–5.1)
Sodium: 141 mmol/L (ref 135–145)

## 2023-12-16 LAB — LIPID PANEL
Cholesterol: 97 mg/dL (ref 0–200)
HDL: 42 mg/dL (ref >40)
LDL Cholesterol: 40 mg/dL (ref 0–99)
Total CHOL/HDL Ratio: 2.3 ratio
Triglycerides: 75 mg/dL (ref <150)
VLDL: 15 mg/dL (ref 0–40)

## 2023-12-16 MED ORDER — PANTOPRAZOLE SODIUM 40 MG PO TBEC
40.0000 mg | DELAYED_RELEASE_TABLET | Freq: Every day | ORAL | 4 refills | Status: AC | PRN
  Filled 2023-12-16: qty 30, 30d supply, fill #0

## 2023-12-16 MED ORDER — CLOPIDOGREL BISULFATE 75 MG PO TABS
75.0000 mg | ORAL_TABLET | Freq: Every day | ORAL | 3 refills | Status: AC
  Filled 2023-12-16: qty 90, 90d supply, fill #0

## 2023-12-16 NOTE — Progress Notes (Signed)
Discharge completed by discharge RN.

## 2023-12-16 NOTE — Discharge Summary (Signed)
 Discharge Summary    Patient ID: [name] MRN: [id number]; DOB: 12-May-1946  Admit date: 12/15/2023 Discharge date: 12/16/2023  PCP:  Maryellen Snare, NP   [facility] Providers Cardiologist:  Lauro Portal, MD   {    Discharge Diagnoses    Principal Problem:   Claudication in peripheral vascular disease [facility]) Active Problems:   Hypothyroidism   Hyperlipidemia LDL goal <70   Essential hypertension   GERD   PVD (peripheral vascular disease) (HCC)   PAD (peripheral artery disease) (HCC)   CAD in native artery  Diagnostic Studies/Procedures    Lower extremity angiography, 12/15/2023 Angiographic Data:  Abdominal aorta-distal abdominal aorta was free of significant disease Left lower extremity-60% mid left common iliac artery stenosis with a 10 mm pullback gradient.  The mid left SFA was highly calcified fluoroscopically and had a CTO over a moderate distance.  There is one-vessel runoff via peroneal. Right lower extremity-scattered 20 to 30% stenoses in the proximal, mid and distal right SFA with one-vessel runoff via the peroneal.  The prior directional atherectomy site and DCB were widely patent   IMPRESSION: Ms. [name] has a highly calcified mid left SFA CTO with one-vessel runoff via the peroneal, decline in her left ABI and lifestyle-limiting claudication on that side.  Will proceed with attempt at PTA, shockwave angioplasty with DCB.  Final Impression: Successful mid left SFA calcified CTO PTA/shockwave angioplasty/DCB for lifestyle-limiting claudication.  The sheath will be removed once ACT falls below 170 and pressure will be held.  Patient be hydrated overnight, discharged home in the morning on DAPT.  Will obtain lower extremity arterial Doppler studies in our [place] office next week and I will see her back 2 to 3 weeks thereafter.  She left the lab in stable condition.   History of Present Illness     [name] is a 78 y.o. female with past  medical history of CAD s/p CABG 10/2010, HTN, HLD who presented to [facility] for scheduled lower extremity angiography performed by Dr. Katheryne Pane on 12/15/2023.  Hospital Course     Patient presented to [facility] hospital for scheduled lower extremity angiography performed by Dr. Katheryne Pane on 12/15/2023. She was complaining of right lower extremity claudication. She had various Doppler studies and angiography studies that confirmed PAD. She had continued discomfort in her left calf which was limited her ADLs and therefore it was determined to pursue a peripheral angiography and endovascular therapy. She presented to the hospital on 12/15/2023 and underwent the following procedures: ultrasound-guided right common femoral access, abdominal aortogram/bilateral iliac angiogram/bifemoral runoff, contralateral access (second-order catheter placement), shockwave angioplasty left SFA CTO followed by DCB. The results of these procedures are listed above.   Post-procedure she was admitted to the hospital to be hydrated overnight and discharged home in the AM on DAPT. With plans to obtain lower extremity arterial Doppler studies in outpatient setting next week and follow up in 2-3 weeks. These appointments are made prior to discharge.  Patient was seen this morning and is doing well. Her femoral access site is healing well, there is no pain, bruising or hematomas present. Patient has been ambulating around her room without any issues. She is aware of her follow up appointments and the new [facility] location.     Did the patient have an acute coronary syndrome (MI, NSTEMI, STEMI, etc) this admission?:  No  Did the patient have a percutaneous coronary intervention (stent / angioplasty)?:  No.    _____________  Discharge Vitals Blood pressure (!) 119/59, pulse 77, temperature 97.7 F (36.5 C), temperature source Oral, resp. rate 18, height 5\' 3"  (1.6 m), weight 56.7 kg, SpO2 96%.  Filed  Weights   12/15/23 0901 12/16/23 0820  Weight: 53.5 kg 56.7 kg   Physical Exam  General appearance: alert and no distress Neck: no adenopathy, no JVD Lungs: clear to auscultation bilaterally Heart: regular rate and rhythm, S1, S2 normal, no murmur Extremities: extremities normal Pulses: 1+ pedal pulses Skin:  No rashes or lesions Neurologic: No focal deficits   Labs & Radiologic Studies    CBC Recent Labs    12/16/23 0616  WBC 4.8  HGB 11.3*  HCT 34.0*  MCV 88.8  PLT 203   Basic Metabolic Panel Recent Labs    16/10/96 0616  NA 141  K 3.3*  CL 110  CO2 22  GLUCOSE 101*  BUN 12  CREATININE 0.57  CALCIUM  8.4*   Liver Function Tests No results for input(s): "AST", "ALT", "ALKPHOS", "BILITOT", "PROT", "ALBUMIN" in the last 72 hours. No results for input(s): "LIPASE", "AMYLASE" in the last 72 hours. High Sensitivity Troponin:   No results for input(s): "TROPONINIHS" in the last 720 hours.  BNP Invalid input(s): "POCBNP" D-Dimer No results for input(s): "DDIMER" in the last 72 hours. Hemoglobin A1C No results for input(s): "HGBA1C" in the last 72 hours. Fasting Lipid Panel Recent Labs    12/16/23 0616  CHOL 97  HDL 42  LDLCALC 40  TRIG 75  CHOLHDL 2.3   Thyroid  Function Tests No results for input(s): "TSH", "T4TOTAL", "T3FREE", "THYROIDAB" in the last 72 hours.  Invalid input(s): "FREET3" _____________  PERIPHERAL VASCULAR CATHETERIZATION Result Date: 12/15/2023 Images from the original result were not included.  [id number] LOCATION:  FACILITY: [facility] PHYSICIAN: Lauro Portal, M.D. 1946-06-16 DATE OF PROCEDURE:  12/15/2023 DATE OF DISCHARGE: PV Angiogram/Intervention History obtained from chart review. SENAYA [name] is a 78 y.o.   African American female patient of Dr. Lourdes Roy referred for peripheral vascular evaluation. She was seen by Dr. Dante Dyer  orthopedic surgeon, who referred her here. I last saw her in the office 08/30/2023. Her past history is  remarkable for ischemic heart disease status post coronary bypass grafting in March 2012 of the LIMA to LAD, vein to the diagonal branch, obtuse marginal branch and a vein to acute marginal branch. She has normal LV function by 2-D echo. Further problems include hypertension, and hyperlipidemia. She complains of right lower extremity claudication and had seen Dr. Alvenia Aus in the past who thought that her pain was arthritic in nature. Lower extremity arterial Doppler studies performed 07/07/14 revealed a right ABI 0.43 with an occluded distal right SFA and popliteal artery, left ABI 0.73 with a high-frequency signal in the mid left SFA. I performed angiography on her 753/15 revealing an occluded left SFA with one vessel runoff via the peroneal. I performed [employer] directional atherectomy followed by PTA using drug-eluting balloon with excellent angiographic result. Her followup Doppler revealed an increase in her right ABI from 0.4 to .7 with resolution of her claudication symptoms. Since I saw her over 2 years ago she developed claudication in her left leg now. I suspected that her previously  documented lesions in her left external leg artery and SFA have progressed. She underwent angiography by myself 06/07/16 revealing 80% segmental mid left SFA stenosis. I performed [facility] 1 directional atherectomy followed  by drug-eluting balloon into plasty. She will vessel runoff via peroneal. She did have 50% distal left common iliac artery stenosis without a gradient. Her follow-up lower extremity Doppler studies performed 06/24/16 were markedly improved ABI 1.04.  Because of right lower extremity claudication and markedly reduced right ABI of 0.46 she underwent angiography by myself on 02/06/2018 really 90% distal right common femoral artery stenosis, total occlusion in the distal right SFA with one-vessel runoff via peroneal.  I performed directional atherectomy followed by drug-eluting balloon angioplasty of the distal SFA  occlusion and chocolate balloon angioplasty of the distal right common femoral artery.  She had follow-up Doppler studies performed in our office 08/25/2017 revealed marked improvement with a right ABI of 0.82.  Her claudication has  markedly improved.  When I last angiogram to her in 2019 her left SFA was widely patent and she had one-vessel runoff via peroneal artery.  Her most recent Doppler studies performed 08/26/2023 revealed a right ABI of 0.86 with a patent right SFA and a left ABI of 0.63 with an occluded left SFA which explains her progressive symptoms  Since I saw her 3 months ago I did begin her on Pletal  empirically which afforded her no clinical benefit.  She returns today with continued discomfort in her left calf which is lifestyle limiting and wishes to proceed with outpatient peripheral angiography and endovascular therapy. Pre Procedure Diagnosis: Peripheral arterial disease Post Procedure Diagnosis: Peripheral arterial disease Operators: Dr. Lauro Portal Procedures Performed:  1.  Ultrasound-guided right common femoral access  2.  Abdominal aortogram/bilateral iliac angiogram/bifemoral runoff  3.  Contralateral access (second-order catheter placement)  4.  Shockwave angioplasty left SFA CTO followed by [facility] PROCEDURE DESCRIPTION: The patient was brought to the second floor [facility] lab in the the postabsorptive state. She was premedicated with IV Versed  and fentanyl . Her right groin was prepped and shaved in usual sterile fashion. Xylocaine  1% was used for local anesthesia. A 5 French sheath was inserted into the right common femoral artery using standard Seldinger technique.  Ultrasound was used to identify the right common femoral access and guide access.  A digital image was captured and placed the patient's chart.  A 5 French pigtail catheters placed in the distal abdominal aorta.  Distal abdominal aortography, bilateral iliac angiography with bifemoral runoff was performed using  static images.  On the pigtail was used for the entirety of the case (85 cc of contrast total to patient).  Retrograde aortic pressures monitored in the case.  Angiographic Data: 1: Abdominal aorta-distal abdominal aorta was free of significant disease 2: Left lower extremity-60% mid left common iliac artery stenosis with a 10 mm pullback gradient.  The mid left SFA was highly calcified fluoroscopically and had a CTO over a moderate distance.  There is one-vessel runoff via peroneal. 3: Right lower extremity-scattered 20 to 30% stenoses in the proximal, mid and distal right SFA with one-vessel runoff via the peroneal.  The prior directional atherectomy site and DCB were widely patent   Ms. Leaks has a highly calcified mid left SFA CTO with one-vessel runoff via the peroneal, decline in her left ABI and lifestyle-limiting claudication on that side.  Will proceed with attempt at PTA, shockwave angioplasty with DCB. Procedure Description: Contralateral access was obtained with crossover catheter, endhole catheter, Glidewire and Rosen wire.  I advanced a 6 French 45 cm catapult sheath over the iliac bifurcation with mild difficulty and placed it in the left common femoral artery (second-order  catheter placement).  The patient received 11,000 units of heparin  with an ACT of 291.  Omnipaque  dye was used for the entirety of the case.  Retroaortic pressures monitored the case.  I was able to cross the CTO with a Viance CTO catheter along with an 0184 g shepherd wire.  I then exchanged the shepherd wire through an 018 balloon to an 0.14/4 g shepherd wire.  I performed PTA with a 2.5 mm x 100 mm long angioplasty balloon and upgraded to a 3 mm x 100 mm long angioplasty balloon.  I then used a 5 mm x 80 mm long shockwave balloon performed multiple intravascular lithotripsy angioplasties throughout the entirety of the CTO.  Following this I exchanged for an 035 Rosen wire through a 3 5 quick cross catheter and perform DCB  with a 5 mm x 200 cm length impact Admiral Balloon at 5 Fears for 3 minutes resulting reduction of a long mid left SFA CTO to less than 20% residual with excellent flow.  There is no obvious dissection.  The peroneal artery remained intact.  I then withdrew the sheath across the bifurcation and exchanged over a Bentson wire for a 6 French short sheath.  Fortunately there was some mild oozing around the sheath and I upgraded to a 7 [place] sheath.  The patient received 300 mg of clopidogrel  at the end of the case.  She tolerated the procedure well. Final Impression: Successful mid left SFA calcified CTO PTA/shockwave angioplasty/DCB for lifestyle-limiting claudication.  The sheath will be removed once ACT falls below 170 and pressure will be held.  Patient be hydrated overnight, discharged home in the morning on DAPT.  Will obtain lower extremity arterial Doppler studies in our [place] office next week and I will see her back 2 to 3 weeks thereafter.  She left the lab in stable condition. Lauro Portal. MD, [facility] 12/15/2023 11:43 AM    Disposition   Pt is being discharged home today in good condition per MD.  Follow-up Plans & Appointments   Future Appointments  Date Time Provider Department Center  12/28/2023  2:00 PM [facility]  01/03/2024  9:00 AM [facility] VASC 3 [facility]  01/03/2024 10:00 AM [facility] 3 [facility]  01/09/2024  8:45 AM Avanell Leigh, MD [facility]  02/22/2024  8:45 AM Luella Sager, DPM [facility]  02/27/2024  8:00 AM [facility] None  03/05/2024  9:00 AM Marlene Simas, MD [facility] None    Discharge Medications   Allergies as of 12/16/2023       Reactions   Atorvastatin  Itching, Other (See Comments)   Muscle aches        Medication List     TAKE these medications    albuterol  108 (90 Base) MCG/ACT inhaler Commonly known as: VENTOLIN  HFA Inhale 1-2 puffs into the lungs every  6 (six) hours as needed for wheezing or shortness of breath.   amLODipine  10 MG tablet Commonly known as: NORVASC  TAKE ONE TABLET BY MOUTH ONCE DAILY   aspirin  EC 81 MG tablet Take 81 mg by mouth in the morning.   clopidogrel  75 MG tablet Commonly known as: PLAVIX  Take 1 tablet (75 mg total) by mouth daily with breakfast.   cyclobenzaprine  10 MG tablet Commonly known as: FLEXERIL  Take 10 mg by mouth at bedtime as needed for muscle spasms.   diclofenac  Sodium 1 % Gel Commonly known as: VOLTAREN  Apply 2 g topically 4 (four) times  daily as needed (pain.).   ezetimibe  10 MG tablet Commonly known as: ZETIA  Take 10 mg by mouth daily.   fluticasone  50 MCG/ACT nasal spray Commonly known as: FLONASE  Place 1 spray into both nostrils daily as needed for allergies.   levothyroxine  88 MCG tablet Commonly known as: SYNTHROID  Take 88 mcg by mouth daily before breakfast.   lisinopril  5 MG tablet Commonly known as: ZESTRIL  Take 5 mg by mouth daily.   multivitamin with minerals Tabs tablet Take 1 tablet by mouth 2 (two) times a week. Centrum   OneTouch Delica Plus Lancet33G Misc 1 each by Other route 2 (two) times daily.   OneTouch Verio test strip Generic drug: glucose blood 1 each by Other route 2 (two) times daily.   pantoprazole  40 MG tablet Commonly known as: PROTONIX  Take 1 tablet (40 mg total) by mouth daily before breakfast. What changed:  when to take this reasons to take this   polyethylene glycol 17 g packet Commonly known as: MiraLax  Take 17 g by mouth daily. What changed:  when to take this reasons to take this   Propylene Glycol 0.6 % Soln Place 1 drop into both eyes every 4 (four) hours as needed (dry eyes).   Vitamin D  (Ergocalciferol ) 1.25 MG (50000 UNIT) Caps capsule Commonly known as: DRISDOL  Take 50,000 Units by mouth once a week.          Outstanding Labs/Studies   Scheduled for follow up LE dopplers  Duration of Discharge Encounter: APP  Time: 20 minutes   Signed, Jiles Mote, PA-C 12/16/2023, 10:25 AM

## 2023-12-16 NOTE — Telephone Encounter (Signed)
 Patient has been called to cancel shoe appt; would like a referral to [employer] [facility]

## 2023-12-16 NOTE — Plan of Care (Signed)
  Problem: Education: Goal: Understanding of CV disease, CV risk reduction, and recovery process will improve Outcome: Progressing   Problem: Activity: Goal: Ability to return to baseline activity level will improve Outcome: Progressing   Problem: Cardiovascular: Goal: Ability to achieve and maintain adequate cardiovascular perfusion will improve Outcome: Progressing Goal: Vascular access site(s) Level 0-1 will be maintained Outcome: Progressing   Problem: Cardiovascular: Goal: Vascular access site(s) Level 0-1 will be maintained Outcome: Progressing   Problem: Education: Goal: Knowledge of General Education information will improve Description: Including pain rating scale, medication(s)/side effects and non-pharmacologic comfort measures Outcome: Progressing

## 2023-12-18 ENCOUNTER — Other Ambulatory Visit (INDEPENDENT_AMBULATORY_CARE_PROVIDER_SITE_OTHER): Admitting: Podiatry

## 2023-12-18 DIAGNOSIS — L84 Corns and callosities: Secondary | ICD-10-CM

## 2023-12-18 DIAGNOSIS — M2041 Other hammer toe(s) (acquired), right foot: Secondary | ICD-10-CM

## 2023-12-18 DIAGNOSIS — E1151 Type 2 diabetes mellitus with diabetic peripheral angiopathy without gangrene: Secondary | ICD-10-CM

## 2023-12-18 DIAGNOSIS — M2011 Hallux valgus (acquired), right foot: Secondary | ICD-10-CM

## 2023-12-18 DIAGNOSIS — M2012 Hallux valgus (acquired), left foot: Secondary | ICD-10-CM

## 2023-12-18 NOTE — Progress Notes (Signed)
 1. Corns and callosities   2. Acquired hammertoe of right foot   3. Hallux valgus, acquired, bilateral   4. Type II diabetes mellitus with peripheral circulatory disorder (HCC)     Orders Placed This Encounter  Procedures   For Home Use Only DME Diabetic Shoe    To [facility]:  For Home Use Only DME Diabetic Shoe  Dispense one pair extra depth shoe and 3 pair total contact insoles. Offload submet head 5 b/l. Shoes must have stretchable uppers due to dorsal corn formation.

## 2023-12-26 ENCOUNTER — Telehealth: Payer: Self-pay | Admitting: Cardiovascular Disease

## 2023-12-26 NOTE — Telephone Encounter (Signed)
 Patient calling to say on the back of her leg and arm it is bruised . Since you she had a procedure. Patient do have some concerns. Please advise

## 2023-12-26 NOTE — Telephone Encounter (Signed)
 Called pt regarding bruising.  Reports had work done to leg on 12/15/23 with Dr. Katheryne Pane.  Has noted leg bruising that is dark in color to back of leg around calf.  Has tried to stay off leg d/t pain with walking.  Reports leg is slightly tender to touch around calf.   Denies SOB, is taking Plavix  as prescribed. Reports bruising has not increased.  Pt has bruising and small knot where IV was.  Advised pt to apply a warm compress to see if helps.   Advised pt to call 911 if has SOB or increased leg pain.  Will send to MD to advise.

## 2023-12-28 ENCOUNTER — Other Ambulatory Visit

## 2023-12-29 NOTE — Telephone Encounter (Signed)
 Avanell Leigh, MD  Christine Cozier, RN3 days ago    It has been 2 weeks since her intervention.  Probably needs to come in for postprocedure Doppler and a follow-up with an APP.   Spoke with pt regarding her bruising. Pt states that bruising has improved over time. Pt is aware of follow up dopplers to be assess her legs and blood flow. Reminded pt of dates and times of her appointments and she is aware of our recent move. She will continue to monitor bruising. No further questions or concerns at this time.

## 2024-01-03 ENCOUNTER — Ambulatory Visit: Payer: Self-pay | Admitting: Cardiovascular Disease

## 2024-01-03 ENCOUNTER — Ambulatory Visit (HOSPITAL_COMMUNITY)
Admission: RE | Admit: 2024-01-03 | Discharge: 2024-01-03 | Disposition: A | Discharge status: Home / Self Care | Source: Ambulatory Visit | Attending: Cardiovascular Disease | Admitting: Cardiovascular Disease

## 2024-01-03 ENCOUNTER — Ambulatory Visit (HOSPITAL_BASED_OUTPATIENT_CLINIC_OR_DEPARTMENT_OTHER)
Admission: RE | Admit: 2024-01-03 | Discharge: 2024-01-03 | Disposition: A | Discharge status: Home / Self Care | Source: Ambulatory Visit | Attending: Cardiovascular Disease | Admitting: Cardiovascular Disease

## 2024-01-03 DIAGNOSIS — I739 Peripheral vascular disease, unspecified: Secondary | ICD-10-CM

## 2024-01-04 LAB — VAS US ABI WITH/WO TBI
Left ABI: 0.65
Right ABI: 0.93

## 2024-01-09 ENCOUNTER — Ambulatory Visit: Attending: Cardiovascular Disease | Admitting: Cardiovascular Disease

## 2024-01-09 ENCOUNTER — Encounter: Payer: Self-pay | Admitting: Cardiovascular Disease

## 2024-01-09 VITALS — BP 128/60 | HR 90 | Ht 63.0 in | Wt 121.0 lb

## 2024-01-09 DIAGNOSIS — I6522 Occlusion and stenosis of left carotid artery: Secondary | ICD-10-CM | POA: Diagnosis not present

## 2024-01-09 DIAGNOSIS — I739 Peripheral vascular disease, unspecified: Secondary | ICD-10-CM

## 2024-01-09 NOTE — Assessment & Plan Note (Signed)
 History of peripheral arterial disease status post peripheral angiogram which I performed in 2015 revealing an occluded left SFA with one-vessel runoff via peroneal.  I performed [facility] 1 directional atherectomy followed by [facility] with an excellent result.  Her ABI did increase from 0.4-0.7 with resolution of her claudication symptoms.  I reangiogram to her 06/07/2016 revealing 80% segmental mid left SFA stenosis and again I performed directional atherectomy followed by [facility] with marked improvement.  I angiogram to her again 02/06/2018 revealing 90% distal right common femoral artery stenosis total occlusion of the distal right SFA with one-vessel runoff.  I performed directional atherectomy followed by [facility] of the distal right SFA with chocolate balloon angioplasty of the common femoral artery.  When I saw her last month she again was complaining of left lower extremity claudication.  Her Doppler showed an occluded left SFA.  I ultimately performed peripheral angiography, shockwave angioplasty followed by [facility] of a mid left SFA CTO with one-vessel runoff via peroneal.  Her follow-up Doppler showed a patent left SFA.  Her ABIs did not improve most likely because of her distal disease with one-vessel runoff.  She has not walked enough to appreciate any improvement in her claudication symptoms yet.  She did did fail Pletal  therapy.

## 2024-01-09 NOTE — Progress Notes (Signed)
 01/09/2024 [name]   08/13/1946  [id number]  Primary Physician Maryellen Snare, NP Primary Cardiologist: Avanell Leigh MD Bennye Bravo, [place]  HPI:  [name] is a 78 y.o.  African American female patient of Dr. Lourdes Roy referred for peripheral vascular evaluation. She was seen by Dr. Dante Dyer  orthopedic surgeon, who referred her here. I last saw her in the office 11/28/23. Her past history is remarkable for ischemic heart disease status post coronary bypass grafting in March 2012 of the LIMA to LAD, vein to the diagonal branch, obtuse marginal branch and a vein to acute marginal branch. She has normal LV function by 2-D echo. Further problems include hypertension, and hyperlipidemia. She complains of right lower extremity claudication and had seen Dr. Alvenia Aus in the past who thought that her pain was arthritic in nature. Lower extremity arterial Doppler studies performed 07/07/14 revealed a right ABI 0.43 with an occluded distal right SFA and popliteal artery, left ABI 0.73 with a high-frequency signal in the mid left SFA. I performed angiography on her 753/15 revealing an occluded left SFA with one vessel runoff via the peroneal. I performed [employer] directional atherectomy followed by PTA using drug-eluting balloon with excellent angiographic result. Her followup Doppler revealed an increase in her right ABI from 0.4 to .7 with resolution of her claudication symptoms. Since I saw her over 2 years ago she developed claudication in her left leg now. I suspected that her previously  documented lesions in her left external leg artery and SFA have progressed. She underwent angiography by myself 06/07/16 revealing 80% segmental mid left SFA stenosis. I performed [facility] 1 directional atherectomy followed by drug-eluting balloon into plasty. She will vessel runoff via peroneal. She did have 50% distal left common iliac artery stenosis without a gradient. Her follow-up lower extremity  Doppler studies performed 06/24/16 were markedly improved ABI 1.04.   Because of right lower extremity claudication and markedly reduced right ABI of 0.46 she underwent angiography by myself on 02/06/2018 really 90% distal right common femoral artery stenosis, total occlusion in the distal right SFA with one-vessel runoff via peroneal.  I performed directional atherectomy followed by drug-eluting balloon angioplasty of the distal SFA occlusion and chocolate balloon angioplasty of the distal right common femoral artery.  She had follow-up Doppler studies performed in our office 08/25/2017 revealed marked improvement with a right ABI of 0.82.  Her claudication has  markedly improved.   When I last angiogram to her in 2019 her left SFA was widely patent and she had one-vessel runoff via peroneal artery.  Her most recent Doppler studies performed 08/26/2023 revealed a right ABI of 0.86 with a patent right SFA and a left ABI of 0.63 with an occluded left SFA which explains her progressive symptoms   Since I saw her in the office 6 weeks ago I did perform peripheral angiography on her/20/25 revealing a 60% left common iliac artery stenosis with a 10 mmHg gradient, fairly long segment mid left SFA CTO which was calcified with one-vessel runoff via peroneal.  I performed shockwave angioplasty followed by [facility] with an excellent result.  Her ABIs did not improve post procedure although LEAs did document a patent left SFA.   Current Meds  Medication Sig   albuterol  (VENTOLIN  HFA) 108 (90 Base) MCG/ACT inhaler Inhale 1-2 puffs into the lungs every 6 (six) hours as needed for wheezing or shortness of breath.   amLODipine  (NORVASC ) 10 MG tablet TAKE ONE TABLET BY  MOUTH ONCE DAILY   aspirin  EC 81 MG tablet Take 81 mg by mouth in the morning.   clopidogrel  (PLAVIX ) 75 MG tablet Take 1 tablet (75 mg total) by mouth daily with breakfast.   cyclobenzaprine  (FLEXERIL ) 10 MG tablet Take 10 mg by mouth at bedtime as needed for  muscle spasms.   diclofenac  Sodium (VOLTAREN ) 1 % GEL Apply 2 g topically 4 (four) times daily as needed (pain.).   ezetimibe  (ZETIA ) 10 MG tablet Take 10 mg by mouth daily.   fluticasone  (FLONASE ) 50 MCG/ACT nasal spray Place 1 spray into both nostrils daily as needed for allergies.   Lancets (ONETOUCH DELICA PLUS LANCET33G) MISC 1 each by Other route 2 (two) times daily.   levothyroxine  (SYNTHROID ) 88 MCG tablet Take 88 mcg by mouth daily before breakfast.   lisinopril  (ZESTRIL ) 5 MG tablet Take 5 mg by mouth daily.   Multiple Vitamin (MULTIVITAMIN WITH MINERALS) TABS tablet Take 1 tablet by mouth 2 (two) times a week. Centrum   ONETOUCH VERIO test strip 1 each by Other route 2 (two) times daily.   pantoprazole  (PROTONIX ) 40 MG tablet Take 1 tablet (40 mg total) by mouth daily as needed (indigestion/heartburn.).   polyethylene glycol (MIRALAX ) 17 g packet Take 17 g by mouth daily. (Patient taking differently: Take 17 g by mouth daily as needed (constipation.).)   Propylene Glycol 0.6 % SOLN Place 1 drop into both eyes every 4 (four) hours as needed (dry eyes).   Vitamin D , Ergocalciferol , (DRISDOL ) 1.25 MG (50000 UNIT) CAPS capsule Take 50,000 Units by mouth once a week.   Current Facility-Administered Medications for the 01/09/24 encounter (Office Visit) with Avanell Leigh, MD  Medication   0.9 %  sodium chloride  infusion     Allergies  Allergen Reactions   Atorvastatin  Itching and Other (See Comments)    Muscle aches    Social History   Socioeconomic History   Marital status: Divorced    Spouse name: Not on file   Number of children: 2   Years of education: 14   Highest education level: Not on file  Occupational History   Occupation: Retired    Associate Professor: RETIRED   Occupation: retired  Tobacco Use   Smoking status: Former    Current packs/day: 0.00    Average packs/day: 0.8 packs/day for 45.0 years (36.0 ttl pk-yrs)    Types: Cigarettes    Start date: 08/23/1964    Quit  date: 08/23/2009    Years since quitting: 14.3   Smokeless tobacco: Never  Vaping Use   Vaping status: Never Used  Substance and Sexual Activity   Alcohol  use: Not Currently    Alcohol /week: 2.0 standard drinks of alcohol     Types: 2 Glasses of wine per week   Drug use: Never   Sexual activity: Not Currently  Other Topics Concern   Not on file  Social History Narrative   Not on file   Social Drivers of Health   Financial Resource Strain: Not on file  Food Insecurity: Not on file  Transportation Needs: Not on file  Physical Activity: Not on file  Stress: Not on file  Social Connections: Not on file  Intimate Partner Violence: Not on file     Review of Systems: General: negative for chills, fever, night sweats or weight changes.  Cardiovascular: negative for chest pain, dyspnea on exertion, edema, orthopnea, palpitations, paroxysmal nocturnal dyspnea or shortness of breath Dermatological: negative for rash Respiratory: negative for cough or wheezing Urologic:  negative for hematuria Abdominal: negative for nausea, vomiting, diarrhea, bright red blood per rectum, melena, or hematemesis Neurologic: negative for visual changes, syncope, or dizziness All other systems reviewed and are otherwise negative except as noted above.    Blood pressure 128/60, pulse 90, height 5\' 3"  (1.6 m), weight 121 lb (54.9 kg), SpO2 97%.  General appearance: alert and no distress Neck: no adenopathy, no JVD, supple, symmetrical, trachea midline, thyroid  not enlarged, symmetric, no tenderness/mass/nodules, and bilateral carotid bruits Lungs: clear to auscultation bilaterally Heart: regular rate and rhythm, S1, S2 normal, no murmur, click, rub or gallop Extremities: extremities normal, atraumatic, no cyanosis or edema Pulses: Diminished pedal pulses Skin: Skin color, texture, turgor normal. No rashes or lesions Neurologic: Grossly normal  EKG not performed today      ASSESSMENT AND PLAN:    Claudication in peripheral vascular disease (HCC) History of peripheral arterial disease status post peripheral angiogram which I performed in 2015 revealing an occluded left SFA with one-vessel runoff via peroneal.  I performed [facility] 1 directional atherectomy followed by [facility] with an excellent result.  Her ABI did increase from 0.4-0.7 with resolution of her claudication symptoms.  I reangiogram to her 06/07/2016 revealing 80% segmental mid left SFA stenosis and again I performed directional atherectomy followed by [facility] with marked improvement.  I angiogram to her again 02/06/2018 revealing 90% distal right common femoral artery stenosis total occlusion of the distal right SFA with one-vessel runoff.  I performed directional atherectomy followed by [facility] of the distal right SFA with chocolate balloon angioplasty of the common femoral artery.  When I saw her last month she again was complaining of left lower extremity claudication.  Her Doppler showed an occluded left SFA.  I ultimately performed peripheral angiography, shockwave angioplasty followed by [facility] of a mid left SFA CTO with one-vessel runoff via peroneal.  Her follow-up Doppler showed a patent left SFA.  Her ABIs did not improve most likely because of her distal disease with one-vessel runoff.  She has not walked enough to appreciate any improvement in her claudication symptoms yet.  She did did fail Pletal  therapy.  Carotid artery disease (HCC) Moderate left ICA stenosis by duplex ultrasound 09/15/2023.  This will be repeated on an annual basis.     Avanell Leigh MD FACP,FACC,FAHA, [facility] 01/09/2024 9:03 AM

## 2024-01-09 NOTE — Assessment & Plan Note (Signed)
 Moderate left ICA stenosis by duplex ultrasound 09/15/2023.  This will be repeated on an annual basis.

## 2024-01-09 NOTE — Patient Instructions (Signed)
 Testing/Procedures: Lower Extremity Arterial Duplex (in 6 months) Your physician has requested that you have a lower or upper extremity arterial duplex. This test is an ultrasound of the arteries in the legs or arms. It looks at arterial blood flow in the legs and arms. Allow one hour for Lower and Upper Arterial scans. There are no restrictions or special instructions.  Please note: We ask at that you not bring children with you during ultrasound (echo/ vascular) testing. Due to room size and safety concerns, children are not allowed in the ultrasound rooms during exams. Our front office staff cannot provide observation of children in our lobby area while testing is being conducted. An adult accompanying a patient to their appointment will only be allowed in the ultrasound room at the discretion of the ultrasound technician under special circumstances. We apologize for any inconvenience.  Ankle Brachial Index (in 6 months) Your physician has requested that you have an ankle brachial index (ABI). During this test an ultrasound and blood pressure cuff are used to evaluate the arteries that supply the arms and legs with blood. Allow thirty minutes for this exam. There are no restrictions or special instructions.  Please note: We ask at that you not bring children with you during ultrasound (echo/ vascular) testing. Due to room size and safety concerns, children are not allowed in the ultrasound rooms during exams. Our front office staff cannot provide observation of children in our lobby area while testing is being conducted. An adult accompanying a patient to their appointment will only be allowed in the ultrasound room at the discretion of the ultrasound technician under special circumstances. We apologize for any inconvenience.   Carotid Ultrasound (in January) Your physician has requested that you have a carotid duplex. This test is an ultrasound of the carotid arteries in your neck. It looks at blood  flow through these arteries that supply the brain with blood. Allow one hour for this exam. There are no restrictions or special instructions.   Follow-Up: At [facility], you and your health needs are our priority.  As part of our continuing mission to provide you with exceptional heart care, our providers are all part of one team.  This team includes your primary Cardiologist (physician) and Advanced Practice Providers or APPs (Physician Assistants and Nurse Practitioners) who all work together to provide you with the care you need, when you need it.  Your next appointment:   1 year(s)  Provider:   Lauro Portal, MD

## 2024-01-17 ENCOUNTER — Telehealth: Payer: Self-pay

## 2024-01-17 NOTE — Telephone Encounter (Signed)
 Referral sent to [facility] for shoes -Britton Cane Cped, CFo, CFm

## 2024-01-31 ENCOUNTER — Other Ambulatory Visit: Payer: Self-pay | Admitting: Internal Medicine

## 2024-02-22 ENCOUNTER — Ambulatory Visit: Admitting: Podiatry

## 2024-02-22 ENCOUNTER — Encounter: Payer: Self-pay | Admitting: Podiatry

## 2024-02-22 DIAGNOSIS — M79675 Pain in left toe(s): Secondary | ICD-10-CM | POA: Diagnosis not present

## 2024-02-22 DIAGNOSIS — B351 Tinea unguium: Secondary | ICD-10-CM | POA: Diagnosis not present

## 2024-02-22 DIAGNOSIS — M2012 Hallux valgus (acquired), left foot: Secondary | ICD-10-CM

## 2024-02-22 DIAGNOSIS — M2041 Other hammer toe(s) (acquired), right foot: Secondary | ICD-10-CM | POA: Diagnosis not present

## 2024-02-22 DIAGNOSIS — M2011 Hallux valgus (acquired), right foot: Secondary | ICD-10-CM | POA: Diagnosis not present

## 2024-02-22 DIAGNOSIS — E119 Type 2 diabetes mellitus without complications: Secondary | ICD-10-CM

## 2024-02-22 DIAGNOSIS — E1151 Type 2 diabetes mellitus with diabetic peripheral angiopathy without gangrene: Secondary | ICD-10-CM | POA: Diagnosis not present

## 2024-02-22 DIAGNOSIS — M79674 Pain in right toe(s): Secondary | ICD-10-CM

## 2024-02-22 DIAGNOSIS — L84 Corns and callosities: Secondary | ICD-10-CM | POA: Diagnosis not present

## 2024-02-22 NOTE — Progress Notes (Signed)
 ANNUAL DIABETIC FOOT EXAM  Subjective: [name] presents today for annual diabetic foot exam. Patient is requesting diabetic shoes on today's visit. She also c/o concern of distal tip of right great toe. She is an area of previous surgical procedure. Chief Complaint  Patient presents with   RFC     [facility] diet control diabetes. A1C 7.0 PCP appointment 03/10/24. Toenail trim.   Patient confirms h/o diabetes.  Patient denies any h/o foot wounds.  Patient has been diagnosed with neuropathy.  Campbell Reynolds, NP is patient's PCP.  Past Medical History:  Diagnosis Date   Anemia    Anxiety    hx   Arthritis    my whole body   Atrial septal aneurysm    COLONIC POLYPS, HX OF    COPD (chronic obstructive pulmonary disease) (HCC)    no per pt on 03/21/2014 & 02/06/2018   Coronary artery disease    a. s/p CABG 2012.   DISC DISEASE, CERVICAL    Gastroesophageal reflux disease    Headache    I have headaches all the time (02/06/2018)   Heart murmur    as a child   History of rheumatic fever    Hyperlipidemia    Hypertension    Hypothyroidism    Insomnia    Osteoarthritis    OSTEOPENIA    PAD (peripheral artery disease) (HCC)    a. PTA to R SFA 2015. b. PTA to L SFA 05/2016.   Plantar fasciitis of left foot 10/02/2019   Pre-diabetes    RSD (reflex sympathetic dystrophy) 10/31/2011   Vitamin D  deficiency    Patient Active Problem List   Diagnosis Date Noted   Hx of colonic polyp 10/20/2022   Nausea without vomiting 10/20/2022   Constipation 10/20/2022   Generalized abdominal pain 10/20/2022   Carotid artery disease (HCC) 09/03/2022   Adenocarcinoma of right lung, stage 4 (HCC) 01/07/2022   S/P partial lobectomy of lung 12/11/2021   Lung nodule 11/02/2021   Osteoarthritis of cervical spine with myelopathy 02/09/2021   Spasticity 02/09/2021   Lumbar radiculopathy, chronic 02/09/2021   Abnormality of gait 02/09/2021   Disease of spinal cord (HCC) 02/09/2021    Plantar fasciitis of left foot 10/02/2019   Status post partial hysterectomy 10/24/2018   Discharge from the vagina 10/24/2018   Claudication in peripheral vascular disease (HCC) 02/06/2018   Chronic idiopathic constipation 10/07/2017   CAD in native artery 06/08/2016   Skin inflammation 04/06/2016   Contracture of hand joint 04/06/2016   Microhematuria 11/12/2015   Left shoulder pain 10/15/2015   Trapezius muscle spasm 09/24/2015   Type 2 diabetes mellitus (HCC) 08/07/2015   Urinary frequency 04/04/2015   External hemorrhoid 11/02/2014   Left-sided thoracic back pain 10/03/2014   Gingivitis 10/03/2014   Routine general medical examination at a health care facility 09/10/2014   Total knee replacement status 06/18/2014   Tooth pain 04/18/2014   Claudication (HCC) 03/21/2014   Primary localized osteoarthrosis, lower leg 08/10/2013   Right knee pain 08/02/2013   Upper respiratory infection 08/02/2013   Dyspepsia 11/16/2012   Knee pain, right 02/18/2012   PAD (peripheral artery disease) (HCC) 12/03/2011   PVD (peripheral vascular disease) (HCC) 10/31/2011   RSD (reflex sympathetic dystrophy) 10/31/2011   Vitamin D  deficiency    Preop exam for internal medicine 10/28/2011   Anxiety 03/11/2011   COPD (chronic obstructive pulmonary disease) (HCC) 02/02/2011   Anemia 02/02/2011   History of rheumatic fever 02/02/2011   Insomnia  02/02/2011   Coronary atherosclerosis 09/08/2010   CONSTIPATION 02/19/2010   TOBACCO ABUSE 07/21/2009   MURMUR 06/17/2009   DISC DISEASE, CERVICAL 03/20/2009   Pain in Soft Tissues of Limb 05/23/2008   Hypothyroidism 09/07/2007   Hyperlipidemia LDL goal <70 09/07/2007   Essential hypertension 09/07/2007   ALLERGIC RHINITIS 09/07/2007   GERD 09/07/2007   Primary osteoarthritis of right knee 09/07/2007   Osteopenia 09/07/2007   History of colonic polyps 09/07/2007   Past Surgical History:  Procedure Laterality Date   ABDOMINAL HYSTERECTOMY  1977    partial   BALLOON ANGIOPLASTY, ARTERY Right 03/21/2014   SFA   CARDIAC CATHETERIZATION  2011    CARPAL TUNNEL RELEASE Right 2005   CATARACT EXTRACTION Left 2013   CORONARY ARTERY BYPASS GRAFT  10/2009   LIMA to the LAD, saphenous vein graft to the acute marginal, saphenous vein graft to the diagonal and obtuse marginal.   DILATION AND CURETTAGE OF UTERUS  1978   INTERCOSTAL NERVE BLOCK Right 12/11/2021   Procedure: INTERCOSTAL NERVE BLOCK;  Surgeon: Kerrin Elspeth BROCKS, MD;  Location: [facility] OR;  Service: Thoracic;  Laterality: Right;   JOINT REPLACEMENT     LOWER EXTREMITY ANGIOGRAM N/A 03/21/2014   Procedure: LOWER EXTREMITY ANGIOGRAM;  Surgeon: Dorn JINNY Lesches, MD;  Location[facility] CATH LAB;  Service: Cardiovascular;  Laterality: N/A;   LOWER EXTREMITY ANGIOGRAM  06/07/2016   Abdominal aortogram/bilateral iliac angiogram/bifemoral runoff   LOWER EXTREMITY ANGIOGRAPHY N/A 02/06/2018   Procedure: LOWER EXTREMITY ANGIOGRAPHY;  Surgeon: Lesches Dorn JINNY, MD;  Location: [facility] INVASIVE CV LAB;  Service: Cardiovascular;  Laterality: N/A;   LOWER EXTREMITY ANGIOGRAPHY N/A 12/15/2023   Procedure: Lower Extremity Angiography;  Surgeon: Lesches Dorn JINNY, MD;  Location[facility] INVASIVE CV LAB;  Service: Cardiovascular;  Laterality: N/A;   LOWER EXTREMITY INTERVENTION Left 12/15/2023   Procedure: LOWER EXTREMITY INTERVENTION;  Surgeon: Lesches Dorn JINNY, MD;  Location: [facility] INVASIVE CV LAB;  Service: Cardiovascular;  Laterality: Left;   LYMPH NODE DISSECTION  12/11/2021   Procedure: LYMPH NODE DISSECTION;  Surgeon: Kerrin Elspeth BROCKS, MD;  Location: [facility] OR;  Service: Thoracic;;   PERIPHERAL INTRAVASCULAR LITHOTRIPSY  12/15/2023   Procedure: PERIPHERAL INTRAVASCULAR LITHOTRIPSY;  Surgeon: Lesches Dorn JINNY, MD;  Location: [facility] INVASIVE CV LAB;  Service: Cardiovascular;;   PERIPHERAL VASCULAR ATHERECTOMY  02/06/2018   Procedure: PERIPHERAL VASCULAR ATHERECTOMY;  Surgeon: Lesches Dorn JINNY, MD;  Location: [facility] INVASIVE CV  LAB;  Service: Cardiovascular;;   PERIPHERAL VASCULAR CATHETERIZATION Bilateral 06/07/2016   Procedure: Lower Extremity Angiography;  Surgeon: Dorn JINNY Lesches, MD;  Location: [facility] INVASIVE CV LAB;  Service: Cardiovascular;  Laterality: Bilateral;   PERIPHERAL VASCULAR CATHETERIZATION Left 06/07/2016   Procedure: Peripheral Vascular Atherectomy;  Surgeon: Dorn JINNY Lesches, MD;  Location: [facility] INVASIVE CV LAB;  Service: Cardiovascular;  Laterality: Left;  SFA   PERIPHERAL VASCULAR CATHETERIZATION Left 06/07/2016   Procedure: Peripheral Vascular Balloon Angioplasty;  Surgeon: Dorn JINNY Lesches, MD;  Location: [facility] INVASIVE CV LAB;  Service: Cardiovascular;  Laterality: Left;  SFA   THYROIDECTOMY  1995   TONSILLECTOMY     TOTAL KNEE ARTHROPLASTY Right 06/18/2014   Procedure: RIGHT TOTAL KNEE ARTHROPLASTY;  Surgeon: Tanda DELENA Heading, MD;  Location: [facility] ORS;  Service: Orthopedics;  Laterality: Right;   XI ROBOTIC ASSISTED THORACOSCOPY- SEGMENTECTOMY Right 12/11/2021   Procedure: XI ROBOTIC ASSISTED THORACOSCOPY-RIGHT LOWER LOBE SUPERIOR SEGMENT, RIGHT UPPER LOBE WEDGE;  Surgeon: Kerrin Elspeth BROCKS, MD;  Location: [facility] OR;  Service: Thoracic;  Laterality: Right;  Current Outpatient Medications on File Prior to Visit  Medication Sig Dispense Refill   albuterol  (VENTOLIN  HFA) 108 (90 Base) MCG/ACT inhaler Inhale 1-2 puffs into the lungs every 6 (six) hours as needed for wheezing or shortness of breath.     amLODipine  (NORVASC ) 10 MG tablet TAKE ONE TABLET BY MOUTH ONCE DAILY 90 tablet 1   aspirin  EC 81 MG tablet Take 81 mg by mouth in the morning.     clopidogrel  (PLAVIX ) 75 MG tablet Take 1 tablet (75 mg total) by mouth daily with breakfast. 90 tablet 3   cyclobenzaprine  (FLEXERIL ) 10 MG tablet Take 10 mg by mouth at bedtime as needed for muscle spasms.     diclofenac  Sodium (VOLTAREN ) 1 % GEL Apply 2 g topically 4 (four) times daily as needed (pain.).     ezetimibe  (ZETIA ) 10 MG tablet Take 10 mg by mouth  daily.     fluticasone  (FLONASE ) 50 MCG/ACT nasal spray Place 1 spray into both nostrils daily as needed for allergies.     Lancets (ONETOUCH DELICA PLUS LANCET33G) MISC 1 each by Other route 2 (two) times daily.     levothyroxine  (SYNTHROID ) 88 MCG tablet Take 88 mcg by mouth daily before breakfast.     lisinopril  (ZESTRIL ) 5 MG tablet Take 5 mg by mouth daily.     Multiple Vitamin (MULTIVITAMIN WITH MINERALS) TABS tablet Take 1 tablet by mouth 2 (two) times a week. Centrum     ONETOUCH VERIO test strip 1 each by Other route 2 (two) times daily.     pantoprazole  (PROTONIX ) 40 MG tablet Take 1 tablet (40 mg total) by mouth daily as needed (indigestion/heartburn.). 30 tablet 4   polyethylene glycol (MIRALAX ) 17 g packet Take 17 g by mouth daily. (Patient taking differently: Take 17 g by mouth daily as needed (constipation.).) 14 each 0   Propylene Glycol 0.6 % SOLN Place 1 drop into both eyes every 4 (four) hours as needed (dry eyes).     Vitamin D , Ergocalciferol , (DRISDOL ) 1.25 MG (50000 UNIT) CAPS capsule Take 50,000 Units by mouth once a week.     Current Facility-Administered Medications on File Prior to Visit  Medication Dose Route Frequency Provider Last Rate Last Admin   0.9 %  sodium chloride  infusion  500 mL Intravenous Once Pyrtle, Gordy HERO, MD       tranexamic acid  (CYKLOKAPRON ) 2,000 mg in sodium chloride  0.9 % 50 mL Topical Application  2,000 mg Topical Once Porterfield, Amber, PA-C        Allergies  Allergen Reactions   Atorvastatin  Itching and Other (See Comments)    Muscle aches   Social History   Occupational History   Occupation: Retired    Associate Professor: RETIRED   Occupation: retired  Tobacco Use   Smoking status: Former    Current packs/day: 0.00    Average packs/day: 0.8 packs/day for 45.0 years (36.0 ttl pk-yrs)    Types: Cigarettes    Start date: 08/23/1964    Quit date: 08/23/2009    Years since quitting: 14.5   Smokeless tobacco: Never  Vaping Use   Vaping status:  Never Used  Substance and Sexual Activity   Alcohol  use: Not Currently    Alcohol /week: 2.0 standard drinks of alcohol     Types: 2 Glasses of wine per week   Drug use: Never   Sexual activity: Not Currently   Family History  Problem Relation Age of Onset   Arthritis Maternal Aunt    Heart disease  Maternal Aunt    Hypertension Maternal Aunt    Diabetes Maternal Aunt    Colon cancer Neg Hx    Kidney disease Neg Hx    Liver disease Neg Hx    Throat cancer Neg Hx    Stomach cancer Neg Hx    Colon polyps Neg Hx    Immunization History  Administered Date(s) Administered   H1N1 09/12/2008   Influenza Whole 07/24/2007, 05/23/2008, 06/17/2009, 06/16/2010   Influenza, High Dose Seasonal PF 05/15/2014, 05/22/2017   Influenza,inj,Quad PF,6+ Mos 05/11/2013, 08/23/2017   Influenza-Unspecified 03/24/2015, 05/19/2016   PFIZER(Purple Top)SARS-COV-2 Vaccination 09/14/2019, 10/05/2019   Pneumococcal Conjugate-13 08/02/2013   Pneumococcal Polysaccharide-23 09/07/2007, 11/16/2012   Td 09/12/2008   Zoster, Live 12/12/2007     Review of Systems: Negative except as noted in the HPI.   Objective: There were no vitals filed for this visit.  [name] is a pleasant 78 y.o. female in NAD. AAO X 3.  Diabetic foot exam was performed with the following findings:   Vascular Examination: CFT <4 seconds b/l. Nonpalpable pedal pulses b/l.  Digital hair absent. Skin temperature gradient warm to cool b/l. No ischemia or gangrene. No cyanosis or clubbing noted b/l. No edema noted b/l LE.   Neurological Examination: Sensation grossly intact b/l with 10 gram monofilament. Vibratory sensation intact b/l.   Dermatological Examination: Pedal skin thin, shiny and atrophic b/l. No open wounds. No interdigital macerations.   Toenails 1-5 b/l thick, discolored, elongated with subungual debris and pain on dorsal palpation.   Preulcerative lesion noted distal tip of right great toe and submet head 5 b/l.  There is visible subdermal hemorrhage. There is no surrounding erythema, no edema, no drainage, no odor, no fluctuance.  Musculoskeletal Examination: Muscle strength 5/5 to all lower extremity muscle groups bilaterally. No pain, crepitus or joint limitation noted with ROM b/l LE. HAV with bunion b/l. Hammertoe(s) 2-5 b/l.SABRA Patient ambulates with cane assistance. She has pain noted lateral anterolateral subtalar joint LLE.  Radiographs: None     Lab Results  Component Value Date   HGBA1C 7.0 (H) 12/14/2021   VAS US  ABI WITH/WO TBI Result Date: 01/04/2024  LOWER EXTREMITY DOPPLER STUDY Patient Name:  [name]  Date of Exam:   01/03/2024 Medical Rec #: [id number]         Accession #:    [id number] Date of Birth: Dec 13, 1945         Patient Gender: F Patient Age:   30 years Exam Location:  [address]   Procedure:      VAS US  ABI WITH/WO TBI   Referring Phys: JONATHAN BERRY --------------------------------------------------------------------------------    Indications: Peripheral artery disease. Patient is s/p left mid SFA shockwave  angioplasty and drug-coated balloon of CTO 12/15/23 by Dr. Court.                Patient reports some improvement in her claudication symptoms since the procedure.   High Risk Factors: Hypertension, hyperlipidemia, Diabetes, past history of smoking, coronary artery disease.    Vascular Interventions: Left mid SFA angioplasty/DCB of CTO 12/15/23. Atherectomy  mid to distal left SFA, drug eluting balloon angioplasty left SFA on 06/07/2016. Hawk 1 directional atherectomy and angioplasty distal right SFA. Chocolate balloon angioplasty distal right.   Comparison   Previous ABIs 08/26/23 were 0.86 on the right and 0.65 on the Study: left.   Performing Technologist: Edsel Mustard RVT    Examination Guidelines: A complete evaluation includes at minimum, Doppler waveform signals  and systolic blood pressure reading at the level of bilateral brachial, anterior  tibial, and posterior tibial arteries, when vessel segments are accessible. Bilateral testing is considered an integral part of a complete examination. Photoelectric Plethysmograph (PPG) waveforms and toe systolic pressure readings are included as required and additional duplex testing as needed. Limited examinations for reoccurring indications may be performed as noted.    ABI Findings: +---------+------------------+-----+-----------+---------+  Right    Rt Pressure (mmHg)IndexWaveform   Comment   +---------+------------------+-----+-----------+---------+  Brachial 134                                         +---------+------------------+-----+-----------+---------+  PTA                             absent     inaudible +---------+------------------+-----+-----------+---------+  PERO     125               0.93 monophasic           +---------+------------------+-----+-----------+---------+  DP       126               0.93 multiphasic          +---------+------------------+-----+-----------+---------+  Great Toe96                0.71 Normal               +---------+------------------+-----+-----------+---------+ +---------+------------------+-----+----------+-------+   Left     Lt Pressure (mmHg)IndexWaveform  Comment +---------+------------------+-----+----------+-------+  Brachial 135                                      +---------+------------------+-----+----------+-------+  PTA      105               0.78 monophasic        +---------+------------------+-----+----------+-------+  PERO     105               0.78 monophasic        +---------+------------------+-----+----------+-------+  DP       89                0.66 monophasic        +---------+------------------+-----+----------+-------+  Great Toe57                0.42 Abnormal          +---------+------------------+-----+----------+-------+  +-------+-----------+-----------+------------+------------+   ABI/TBIToday's ABIToday's TBIPrevious ABI Previous TBI +-------+-----------+-----------+------------+------------+  Right  0.93       0.71       0.86        0.69         +-------+-----------+-----------+------------+------------+  Left   0.65       0.42       0.65        0.39         +-------+-----------+-----------+------------+------------+   Right ABIs appear essentially unchanged compared to prior study on 08/26/23.  Left ABIs appear increased compared to prior study on 08/26/23.    Summary: Right: Resting right ankle-brachial index indicates mild right lower extremity arterial disease. The right toe-brachial index is normal.   Left: Resting left ankle-brachial index indicates moderate left lower extremity arterial disease. The left toe-brachial index is abnormal.   *  See table(s) above for measurements and observations. See arterial duplex report.  Suggest follow up study in 6 months.   Electronically signed by Dorn Ross MD on 01/04/2024 at 2:05:15 PM.    Final    ADA Risk Categorization: High Risk:  Patient has one or more of the following: Loss of protective sensation Absent pedal pulses Severe Foot deformity History of foot ulcer  Assessment: 1. Pain due to onychomycosis of toenails of both feet   2. Pre-ulcerative calluses   3. Acquired hammertoe of right foot   4. Hallux valgus, acquired, bilateral   5. Type II diabetes mellitus with peripheral circulatory disorder (HCC)   6. Encounter for diabetic foot exam (HCC)     Plan: Orders Placed This Encounter  Procedures   For home use only DME Other see comment    To [facility]:  Dispense one pair extra depth shoes and 3 pair custom insoles. Offload calluses submet head 5 bilaterally.    Length of Need:   Lifetime   Consent given for treatment. Diabetic foot examination performed. All patient's and/or POA's  questions/concerns addressed on today's visit. Mycotic toenails 1-5 debrided in length and girth without incident. Preulcerative lesion(s)  right hallux and submet head 5 b/l pared with sharp debridement without incident. Continue foot and shoe inspections daily. Monitor blood glucose per PCP/Endocrinologist's recommendations.Continue soft, supportive shoe gear daily. Report any pedal injuries to medical professional.  -Rx to be sent to [facility] dispensed to patient for one pair diabetic shoes and 3 pair custom insoles. -Patient referred to Dr. Juliene Medicine for evaluation right great toe s/p surgery as well as left lower extremity pain. -Patient/POA to call should there be question/concern in the interim. Return in about 3 months (around 05/24/2024).  Delon LITTIE Merlin, DPM      [place] LOCATION: [address] [place] [address]                   Office [phone]   [facility] LOCATION: [address] [place], [place] [address] Office [phone]

## 2024-02-27 ENCOUNTER — Other Ambulatory Visit: Payer: Medicare (Managed Care)

## 2024-02-28 ENCOUNTER — Ambulatory Visit (HOSPITAL_COMMUNITY)

## 2024-03-02 ENCOUNTER — Ambulatory Visit (HOSPITAL_COMMUNITY): Attending: Internal Medicine

## 2024-03-02 ENCOUNTER — Inpatient Hospital Stay: Attending: Internal Medicine

## 2024-03-05 ENCOUNTER — Inpatient Hospital Stay: Payer: Medicare (Managed Care) | Admitting: Internal Medicine

## 2024-03-05 ENCOUNTER — Telehealth: Payer: Self-pay | Admitting: Medical Oncology

## 2024-03-05 NOTE — Telephone Encounter (Signed)
 LVM to return my call about missed appt this am . Pt did not get her CT scan scheduled.

## 2024-03-13 ENCOUNTER — Ambulatory Visit (INDEPENDENT_AMBULATORY_CARE_PROVIDER_SITE_OTHER)

## 2024-03-13 ENCOUNTER — Ambulatory Visit: Admitting: Podiatry

## 2024-03-13 VITALS — Ht 63.0 in | Wt 121.0 lb

## 2024-03-13 DIAGNOSIS — T8484XA Pain due to internal orthopedic prosthetic devices, implants and grafts, initial encounter: Secondary | ICD-10-CM

## 2024-03-13 DIAGNOSIS — M7672 Peroneal tendinitis, left leg: Secondary | ICD-10-CM | POA: Diagnosis not present

## 2024-03-13 DIAGNOSIS — M25571 Pain in right ankle and joints of right foot: Secondary | ICD-10-CM | POA: Diagnosis not present

## 2024-03-13 DIAGNOSIS — M25572 Pain in left ankle and joints of left foot: Secondary | ICD-10-CM | POA: Diagnosis not present

## 2024-03-13 NOTE — Progress Notes (Signed)
 Subjective:  Patient ID: [name], female    DOB: [birth year],  MRN: [id number]  Chief Complaint  Patient presents with   Foot Pain    R4:Patient is here for evaluation of right great toe s/p surgery. Pain at tip of digit and left ankle pain. (Needs bilateral xray).    Discussed the use of AI scribe software for clinical note transcription with the patient, who gave verbal consent to proceed.  History of Present Illness [name] is a 78 year old female with a history of left leg blood blockage who presents with left ankle and leg pain.  On April 27, she experienced a 100% blood blockage in her left leg, which was surgically treated. Initially, she felt fine post-surgery, but subsequently developed pain in the left ankle and leg, particularly when standing or walking. The pain has persisted since then, affecting her balance and mobility. The pain is located around the left ankle and leg, with occasional low back pain and sciatica-like symptoms.  She has issues with her right big toe, where she frequently develops a callus that requires removal. This has been an ongoing issue, exacerbated by wearing safety shoes for 30 years during her work, which also led to a surgical intervention involving a screw in her right foot.  She has diabetes, but her A1c levels have been well-controlled. She has not been active in the gym recently due to her leg issues, although she typically exercises more in the summer.  No new circulation issues in the right leg. Occasional low back pain and sciatica-like symptoms.      Objective:    Physical Exam VASCULAR: DP and PT pulse palpable. Foot is warm and well-perfused. Capillary fill time is brisk. DERMATOLOGIC: Normal skin turgor, texture, and temperature. No open lesions, rashes, or ulcerations. NEUROLOGIC: Normal sensation to light touch and pressure. No paresthesias. ORTHOPEDIC: Significant pes planus deformity. Pain on peroneal tendons at  left distal malleolus and sinus tarsi. Pain with range of motion and palpation of subtalar joint. No distal or proximal pain. Tenderness at tip of right hallux with callus. No ecchymosis or bruising. No gross deformity.   No images are attached to the encounter.    Results     Assessment:   1. Pain due to internal orthopedic prosthetic devices, implants and grafts, initial encounter (HCC)   2. Sinus tarsi syndrome of left ankle      Plan:  Patient was evaluated and treated and all questions answered.  Assessment and Plan Assessment & Plan Left ankle and leg pain due to tendonitis Chronic left ankle and leg pain exacerbated by walking and standing, likely due to tendonitis and pes planus deformity.  Pain localized to peroneal tendons at the distal malleolus and sinus tarsi, with pain on range of motion of the subtalar joint. - Administer steroid cortisone injection into the joint to reduce inflammation for sinus tarsi syndrome. - Prescribe topical anti-inflammatory Voltaren  gel. - Refer to physical therapy to build strength in the leg and muscle. - Advise wearing good supportive shoes.  Callus on right hallux Recurrent callus formation on the right hallux, possibly exacerbated by previous surgery and long-term use of safety shoes. Presence of a screw from prior surgery may contribute to pressure and callus formation. Surgical removal of the screw is not recommended due to potential circulation issues and surgical risks, including infection or nonhealing of soft tissue, which could lead to toe loss. - Continue using rubber toe protectors with cotton padding to  manage callus discomfort. - Avoid surgical removal of the screw due to circulation issues and potential surgical complications.  I discussed with her if her pain did worsen we could consider this but would need reevaluation of her peripheral vasculature  Diabetes mellitus Diabetes mellitus with reportedly good control of A1c  levels.      Return in about 2 months (around 05/14/2024) for re-check peroneal tendinitis.

## 2024-03-13 NOTE — Patient Instructions (Signed)
Call to schedule physical therapy: [facility] at [place]  [phone]     Peroneal Tendinopathy Rehab Ask your health care provider which exercises are safe for you. Do exercises exactly as told by your health care provider and adjust them as directed. It is normal to feel mild stretching, pulling, tightness, or discomfort as you do these exercises. Stop right away if you feel sudden pain or your pain gets worse. Do not begin these exercises until told by your health care provider. Stretching and range-of-motion exercises These exercises warm up your muscles and joints and improve the movement and flexibility of your ankle. These exercises also help to relieve pain and stiffness. Gastroc and soleus stretch, standing  This is an exercise in which you stand on a step and use your body weight to stretch your calf muscles. To do this exercise: Stand on the edge of a step on the ball of your left / right foot. The ball of your foot is on the walking surface, right under your toes. Keep your other foot firmly on the same step. Hold on to the wall, a railing, or a chair for balance. Slowly lift your other foot, allowing your body weight to press your left / right heel down over the edge of the step. You should feel a stretch in your left / right calf (gastrocnemius and soleus). Hold this position for 15 seconds. Return both feet to the step. Repeat this exercise with a slight bend in your left / right knee. Repeat 5 times with your left / right knee straight and 5 times with your left / right knee bent. Complete this exercise 2 times a day. Strengthening exercises These exercises build strength and endurance in your foot and ankle. Endurance is the ability to use your muscles for a long time, even after they get tired. Ankle dorsiflexion with band   Secure a rubber exercise band or tube to an object, such as a table leg, that  will not move when the band is pulled. Secure the other end of the band around your left / right foot. Sit on the floor, facing the object with your left / right leg extended. The band or tube should be slightly tense when your foot is relaxed. Slowly flex your left / right ankle and toes to bring your foot toward you (dorsiflexion). Hold this position for 15 seconds. Let the band or tube slowly pull your foot back to the starting position. Repeat 5 times. Complete this exercise 2 times a day. Ankle eversion Sit on the floor with your legs straight out in front of you. Loop a rubber exercise band or tube around the ball of your left / right foot. The ball of your foot is on the walking surface, right under your toes. Hold the ends of the band in your hands, or secure the band to a stable object. The band or tube should be slightly tense when your foot is relaxed. Slowly push your foot outward, away from your other leg (eversion). Hold this position for 15 seconds. Slowly return your foot to the starting position. Repeat 5 times. Complete this exercise 2 times a day. Plantar flexion, standing  This exercise is sometimes called standing heel raise. Stand with your feet shoulder-width apart. Place your hands on a wall or table to steady yourself as needed, but try not to use it for support. Keep your weight spread evenly over the width  of your feet while you slowly rise up on your toes (plantar flexion). If told by your health care provider: Shift your weight toward your left / right leg until you feel challenged. Stand on your left / right leg only. Hold this position for 15 seconds. Repeat 2 times. Complete this exercise 2 times a day. Single leg stand Without shoes, stand near a railing or in a doorway. You may hold on to the railing or door frame as needed. Stand on your left / right foot. Keep your big toe down on the floor and try to keep your arch lifted. Do not roll to the outside of  your foot. If this exercise is too easy, you can try it with your eyes closed or while standing on a pillow. Hold this position for 15 seconds. Repeat 5 times. Complete this exercise 2 times a day. This information is not intended to replace advice given to you by your health care provider. Make sure you discuss any questions you have with your health care provider. Document Revised: 11/28/2018 Document Reviewed: 11/28/2018 Elsevier Patient Education  [place].

## 2024-04-26 ENCOUNTER — Telehealth: Payer: Self-pay | Admitting: Podiatry

## 2024-04-26 NOTE — Telephone Encounter (Signed)
 I spoke with patient but she is sick and asked me to give her a call back. She is aware that her next appointment needs to be rescheduled.

## 2024-05-01 ENCOUNTER — Encounter: Payer: Self-pay | Admitting: Cardiovascular Disease

## 2024-05-01 ENCOUNTER — Ambulatory Visit: Attending: Cardiovascular Disease | Admitting: Cardiovascular Disease

## 2024-05-01 VITALS — BP 131/71 | HR 71 | Ht 63.0 in | Wt 123.5 lb

## 2024-05-01 DIAGNOSIS — I739 Peripheral vascular disease, unspecified: Secondary | ICD-10-CM | POA: Diagnosis not present

## 2024-05-01 DIAGNOSIS — M79605 Pain in left leg: Secondary | ICD-10-CM

## 2024-05-01 NOTE — Progress Notes (Signed)
 05/01/2024 [name]   December 15, 1945  [id number]  Primary Physician Campbell Reynolds, NP Primary Cardiologist: Dorn JINNY Lesches MD [place], [place]  HPI:  [name] is a 78 y.o.    African American female patient of Dr. Vertie referred for peripheral vascular evaluation. She was seen by Dr. Heide  orthopedic surgeon, who referred her here. I last saw her in the office 01/09/2024. Her past history is remarkable for ischemic heart disease status post coronary bypass grafting in March 2012 of the LIMA to LAD, vein to the diagonal branch, obtuse marginal branch and a vein to acute marginal branch. She has normal LV function by 2-D echo. Further problems include hypertension, and hyperlipidemia. She complains of right lower extremity claudication and had seen Dr. Darron in the past who thought that her pain was arthritic in nature. Lower extremity arterial Doppler studies performed 07/07/14 revealed a right ABI 0.43 with an occluded distal right SFA and popliteal artery, left ABI 0.73 with a high-frequency signal in the mid left SFA. I performed angiography on her 753/15 revealing an occluded left SFA with one vessel runoff via the peroneal. I performed [employer]bo[employer] directional atherectomy followed by PTA using drug-eluting balloon with excellent angiographic result. Her followup Doppler revealed an increase in her right ABI from 0.4 to .7 with resolution of her claudication symptoms. Since I saw her over 2 years ago she developed claudication in her left leg now. I suspected that her previously  documented lesions in her left external leg artery and SFA have progressed. She underwent angiography by myself 06/07/16 revealing 80% segmental mid left SFA stenosis. I performed [facility] 1 directional atherectomy followed by drug-eluting balloon into plasty. She will vessel runoff via peroneal. She did have 50% distal left common iliac artery stenosis without a gradient. Her follow-up lower extremity  Doppler studies performed 06/24/16 were markedly improved ABI 1.04.   Because of right lower extremity claudication and markedly reduced right ABI of 0.46 she underwent angiography by myself on 02/06/2018 really 90% distal right common femoral artery stenosis, total occlusion in the distal right SFA with one-vessel runoff via peroneal.  I performed directional atherectomy followed by drug-eluting balloon angioplasty of the distal SFA occlusion and chocolate balloon angioplasty of the distal right common femoral artery.  She had follow-up Doppler studies performed in our office 08/25/2017 revealed marked improvement with a right ABI of 0.82.  Her claudication has  markedly improved.   When I last angiogram to her in 2019 her left SFA was widely patent and she had one-vessel runoff via peroneal artery.  Her most recent Doppler studies performed 08/26/2023 revealed a right ABI of 0.86 with a patent right SFA and a left ABI of 0.63 with an occluded left SFA which explains her progressive symptoms   Since I saw her in the office 4 months ago she has developed recurrent claudication in her left calf as well as tenderness at a very specific location consistent with a venous lake.  She otherwise is asymptomatic and denies chest pain or shortness of breath.  I did do angiography on her/20/25 revealing a 60% left common iliac artery stenosis with a 10 mm pullback gradient, left SFA CTO with one-vessel runoff via peroneal artery.  I performed shockwave angioplasty followed by [facility] with excellent result.  For follow-up Dopplers post procedure 01/03/2024 showed no increase in left ABI although her velocities in her mid left SFA had markedly improved.   Current Meds  Medication  Sig   albuterol  (VENTOLIN  HFA) 108 (90 Base) MCG/ACT inhaler Inhale 1-2 puffs into the lungs every 6 (six) hours as needed for wheezing or shortness of breath.   amLODipine  (NORVASC ) 10 MG tablet TAKE ONE TABLET BY MOUTH ONCE DAILY   aspirin  EC 81 MG  tablet Take 81 mg by mouth in the morning.   clopidogrel  (PLAVIX ) 75 MG tablet Take 1 tablet (75 mg total) by mouth daily with breakfast.   cyclobenzaprine  (FLEXERIL ) 10 MG tablet Take 10 mg by mouth at bedtime as needed for muscle spasms.   diclofenac  Sodium (VOLTAREN ) 1 % GEL Apply 2 g topically 4 (four) times daily as needed (pain.).   doxycycline (VIBRA-TABS) 100 MG tablet Take 100 mg by mouth 2 (two) times daily.   ezetimibe  (ZETIA ) 10 MG tablet Take 10 mg by mouth daily.   fluticasone  (FLONASE ) 50 MCG/ACT nasal spray Place 1 spray into both nostrils daily as needed for allergies.   Lancets (ONETOUCH DELICA PLUS LANCET33G) MISC 1 each by Other route 2 (two) times daily.   levothyroxine  (SYNTHROID ) 88 MCG tablet Take 88 mcg by mouth daily before breakfast.   lisinopril  (ZESTRIL ) 5 MG tablet Take 5 mg by mouth daily.   Multiple Vitamin (MULTIVITAMIN WITH MINERALS) TABS tablet Take 1 tablet by mouth 2 (two) times a week. Centrum   ONETOUCH VERIO test strip 1 each by Other route 2 (two) times daily.   pantoprazole  (PROTONIX ) 40 MG tablet Take 1 tablet (40 mg total) by mouth daily as needed (indigestion/heartburn.).   polyethylene glycol (MIRALAX ) 17 g packet Take 17 g by mouth daily.   Propylene Glycol 0.6 % SOLN Place 1 drop into both eyes every 4 (four) hours as needed (dry eyes).   Vitamin D , Ergocalciferol , (DRISDOL ) 1.25 MG (50000 UNIT) CAPS capsule Take 50,000 Units by mouth once a week.     Allergies  Allergen Reactions   Atorvastatin  Itching and Other (See Comments)    Muscle aches    Social History   Socioeconomic History   Marital status: Divorced    Spouse name: Not on file   Number of children: 2   Years of education: 14   Highest education level: Not on file  Occupational History   Occupation: Retired    Associate Professor: RETIRED   Occupation: retired  Tobacco Use   Smoking status: Former    Current packs/day: 0.00    Average packs/day: 0.8 packs/day for 45.0 years (36.0  ttl pk-yrs)    Types: Cigarettes    Start date: 08/23/1964    Quit date: 08/23/2009    Years since quitting: 14.6   Smokeless tobacco: Never  Vaping Use   Vaping status: Never Used  Substance and Sexual Activity   Alcohol  use: Not Currently    Alcohol /week: 2.0 standard drinks of alcohol     Types: 2 Glasses of wine per week   Drug use: Never   Sexual activity: Not Currently  Other Topics Concern   Not on file  Social History Narrative   Not on file   Social Drivers of Health   Financial Resource Strain: Not on file  Food Insecurity: Not on file  Transportation Needs: Not on file  Physical Activity: Not on file  Stress: Not on file  Social Connections: Not on file  Intimate Partner Violence: Not on file     Review of Systems: General: negative for chills, fever, night sweats or weight changes.  Cardiovascular: negative for chest pain, dyspnea on exertion, edema, orthopnea, palpitations,  paroxysmal nocturnal dyspnea or shortness of breath Dermatological: negative for rash Respiratory: negative for cough or wheezing Urologic: negative for hematuria Abdominal: negative for nausea, vomiting, diarrhea, bright red blood per rectum, melena, or hematemesis Neurologic: negative for visual changes, syncope, or dizziness All other systems reviewed and are otherwise negative except as noted above.    Blood pressure 131/71, pulse 71, height 5' 3 (1.6 m), weight 123 lb 8 oz (56 kg), SpO2 97%.  General appearance: alert and no distress Neck: no adenopathy, no carotid bruit, no JVD, supple, symmetrical, trachea midline, and thyroid  not enlarged, symmetric, no tenderness/mass/nodules Lungs: clear to auscultation bilaterally Heart: regular rate and rhythm, S1, S2 normal, no murmur, click, rub or gallop Extremities: extremities normal, atraumatic, no cyanosis or edema Pulses: Absent pedal pulses Skin: Skin color, texture, turgor normal. No rashes or lesions Neurologic: Grossly  normal  EKG EKG Interpretation Date/Time:  Tuesday May 01 2024 08:54:20 EDT Ventricular Rate:  75 PR Interval:  180 QRS Duration:  66 QT Interval:  376 QTC Calculation: 419 R Axis:   73  Text Interpretation: Normal sinus rhythm Septal infarct (cited on or before 14-May-2018) When compared with ECG of 29-Jul-2023 09:12, Questionable change in QRS axis Confirmed by Court Carrier [phone]) on 05/01/2024 9:09:26 AM    ASSESSMENT AND PLAN:   Claudication in peripheral vascular disease (HCC) Ms. Lackman returns today for follow-up of PAD.  I last saw her in the office 01/09/2024.  She is has had multiple peripheral interventions in the past by myself dating back to 2015.  Her most recent procedure performed/20/25 revealed a 60% left common iliac artery stenosis with a 10 mm pullback gradient, mid left SFA CTO with one-vessel runoff via peroneal.  I performed shockwave angioplasty followed by [facility] with an excellent angiographic result.  Her follow-up Doppler studies performed 01/03/2024 showed no change in her left ABI although her velocities have significantly improved as did her claudication.  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  I am going to get follow-up aortoiliac and lower extremity arterial Doppler studies and venous Doppler studies and we will see her back after that to review.     Carrier DOROTHA Court MD FACP,FACC,FAHA, FSCAI 05/01/2024 9:16 AM

## 2024-05-01 NOTE — Assessment & Plan Note (Signed)
 [name] returns today for follow-up of PAD.  I last saw her in the office 01/09/2024.  She is has had multiple peripheral interventions in the past by myself dating back to 2015.  Her most recent procedure performed/20/25 revealed a 60% left common iliac artery stenosis with a 10 mm pullback gradient, mid left SFA CTO with one-vessel runoff via peroneal.  I performed shockwave angioplasty followed by [facility] with an excellent angiographic result.  Her follow-up Doppler studies performed 01/03/2024 showed no change in her left ABI although her velocities have significantly improved as did her claudication.  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  I am going to get follow-up aortoiliac and lower extremity arterial Doppler studies and venous Doppler studies and we will see her back after that to review.

## 2024-05-01 NOTE — Patient Instructions (Signed)
 Medication Instructions:  Your physician recommends that you continue on your current medications as directed. Please refer to the Current Medication list given to you today.  *If you need a refill on your cardiac medications before your next appointment, please call your pharmacy*  Testing/Procedures: Your physician has requested that you have a lower extremity venous duplex. This test is an ultrasound of the veins in the legs. It looks at venous blood flow that carries blood from the heart to the legs. Allow one hour for a Lower Venous exam.   Please note: We ask at that you not bring children with you during ultrasound (echo/ vascular) testing. Due to room size and safety concerns, children are not allowed in the ultrasound rooms during exams. Our front office staff cannot provide observation of children in our lobby area while testing is being conducted. An adult accompanying a patient to their appointment will only be allowed in the ultrasound room at the discretion of the ultrasound technician under special circumstances. We apologize for any inconvenience.    Your physician has requested that you have an Aorta/Iliac Duplex. This will take place at [address] [facility], 4th floor  No food after 11PM the night before.  Water  is OK. (Don't drink liquids if you have been instructed not to for ANOTHER test) Avoid foods that produce bowel gas, for 24 hours prior to exam (see below). No breakfast, no chewing gum, no smoking or carbonated beverages. Patient may take morning medications with water . Come in for test at least 15 minutes early to register. **To do with LEA/ABIs**  Please note: We ask at that you not bring children with you during ultrasound (echo/ vascular) testing. Due to room size and safety concerns, children are not allowed in the ultrasound rooms during exams. Our front office staff cannot provide observation of children in our lobby area while testing is being conducted. An adult  accompanying a patient to their appointment will only be allowed in the ultrasound room at the discretion of the ultrasound technician under special circumstances. We apologize for any inconvenience.   Follow-Up: At [facility], you and your health needs are our priority.  As part of our continuing mission to provide you with exceptional heart care, our providers are all part of one team.  This team includes your primary Cardiologist (physician) and Advanced Practice Providers or APPs (Physician Assistants and Nurse Practitioners) who all work together to provide you with the care you need, when you need it.  Your next appointment:   After dopplers  Provider:   Dorn Lesches, MD    We recommend signing up for the patient portal called MyChart.  Sign up information is provided on this After Visit Summary.  MyChart is used to connect with patients for Virtual Visits (Telemedicine).  Patients are able to view lab/test results, encounter notes, upcoming appointments, etc.  Non-urgent messages can be sent to your provider as well.   To learn more about what you can do with MyChart, go to [email or web address].

## 2024-05-14 ENCOUNTER — Ambulatory Visit: Admitting: Podiatry

## 2024-05-15 ENCOUNTER — Ambulatory Visit: Payer: Self-pay | Admitting: Cardiovascular Disease

## 2024-05-15 ENCOUNTER — Ambulatory Visit (HOSPITAL_COMMUNITY)
Admission: RE | Admit: 2024-05-15 | Discharge: 2024-05-15 | Disposition: A | Discharge status: Home / Self Care | Source: Ambulatory Visit | Attending: Cardiovascular Disease | Admitting: Cardiovascular Disease

## 2024-05-15 DIAGNOSIS — M79605 Pain in left leg: Secondary | ICD-10-CM | POA: Diagnosis present

## 2024-06-06 ENCOUNTER — Ambulatory Visit (INDEPENDENT_AMBULATORY_CARE_PROVIDER_SITE_OTHER): Admitting: Podiatry

## 2024-06-06 DIAGNOSIS — Z91199 Patient's noncompliance with other medical treatment and regimen due to unspecified reason: Secondary | ICD-10-CM

## 2024-06-06 NOTE — Progress Notes (Signed)
 1. No-show for appointment

## 2024-07-11 ENCOUNTER — Ambulatory Visit (HOSPITAL_BASED_OUTPATIENT_CLINIC_OR_DEPARTMENT_OTHER)
Admission: RE | Admit: 2024-07-11 | Discharge: 2024-07-11 | Disposition: A | Discharge status: Home / Self Care | Source: Ambulatory Visit | Attending: Cardiovascular Disease | Admitting: Cardiovascular Disease

## 2024-07-11 ENCOUNTER — Ambulatory Visit (HOSPITAL_COMMUNITY)
Admission: RE | Admit: 2024-07-11 | Discharge: 2024-07-11 | Disposition: A | Source: Ambulatory Visit | Attending: Cardiovascular Disease | Admitting: Cardiovascular Disease

## 2024-07-11 ENCOUNTER — Ambulatory Visit (HOSPITAL_COMMUNITY)
Admission: RE | Admit: 2024-07-11 | Discharge: 2024-07-11 | Disposition: A | Discharge status: Home / Self Care | Source: Ambulatory Visit | Attending: Cardiovascular Disease | Admitting: Cardiovascular Disease

## 2024-07-11 ENCOUNTER — Ambulatory Visit: Payer: Self-pay | Admitting: Cardiovascular Disease

## 2024-07-11 DIAGNOSIS — I739 Peripheral vascular disease, unspecified: Secondary | ICD-10-CM

## 2024-07-11 DIAGNOSIS — M79605 Pain in left leg: Secondary | ICD-10-CM | POA: Diagnosis not present

## 2024-07-11 LAB — VAS US ABI WITH/WO TBI
Left ABI: 0.57
Right ABI: 0.82

## 2024-07-17 ENCOUNTER — Ambulatory Visit
Admission: RE | Admit: 2024-07-17 | Discharge: 2024-07-17 | Disposition: A | Discharge status: Home / Self Care | Source: Ambulatory Visit | Attending: Family | Admitting: Family

## 2024-07-17 ENCOUNTER — Other Ambulatory Visit: Payer: Self-pay | Admitting: Family

## 2024-07-17 DIAGNOSIS — M79605 Pain in left leg: Secondary | ICD-10-CM

## 2024-07-24 ENCOUNTER — Ambulatory Visit: Attending: Cardiovascular Disease | Admitting: Cardiovascular Disease

## 2024-07-24 ENCOUNTER — Encounter: Payer: Self-pay | Admitting: Cardiovascular Disease

## 2024-07-24 VITALS — BP 112/72 | HR 84 | Ht 63.0 in | Wt 124.6 lb

## 2024-07-24 DIAGNOSIS — I6522 Occlusion and stenosis of left carotid artery: Secondary | ICD-10-CM

## 2024-07-24 DIAGNOSIS — I739 Peripheral vascular disease, unspecified: Secondary | ICD-10-CM

## 2024-07-24 MED ORDER — CILOSTAZOL 50 MG PO TABS: 50.0000 mg | ORAL_TABLET | Freq: Two times a day (BID) | ORAL | 1 refills | Status: AC

## 2024-07-24 NOTE — Progress Notes (Signed)
 07/24/2024 [name]   1945/11/20  [id number]  Primary Physician Tackett, Candis CROME, NP Primary Cardiologist: Dorn JINNY Lesches MD [place], [place], [place]  HPI:  [name] is a 78 y.o.    African American female patient of Dr. Vertie referred for peripheral vascular evaluation. She was seen by Dr. Heide  orthopedic surgeon, who referred her here. I last saw her in the office 05/01/2024. Her past history is remarkable for ischemic heart disease status post coronary bypass grafting in March 2012 of the LIMA to LAD, vein to the diagonal branch, obtuse marginal branch and a vein to acute marginal branch. She has normal LV function by 2-D echo. Further problems include hypertension, and hyperlipidemia. She complains of right lower extremity claudication and had seen Dr. Darron in the past who thought that her pain was arthritic in nature. Lower extremity arterial Doppler studies performed 07/07/14 revealed a right ABI 0.43 with an occluded distal right SFA and popliteal artery, left ABI 0.73 with a high-frequency signal in the mid left SFA. I performed angiography on her 753/15 revealing an occluded left SFA with one vessel runoff via the peroneal. I performed [employer] directional atherectomy followed by PTA using drug-eluting balloon with excellent angiographic result. Her followup Doppler revealed an increase in her right ABI from 0.4 to .7 with resolution of her claudication symptoms. Since I saw her over 2 years ago she developed claudication in her left leg now. I suspected that her previously  documented lesions in her left external leg artery and SFA have progressed. She underwent angiography by myself 06/07/16 revealing 80% segmental mid left SFA stenosis. I performed [facility] 1 directional atherectomy followed by drug-eluting balloon into plasty. She will vessel runoff via peroneal. She did have 50% distal left common iliac artery stenosis without a gradient. Her follow-up lower extremity  Doppler studies performed 06/24/16 were markedly improved ABI 1.04.   Because of right lower extremity claudication and markedly reduced right ABI of 0.46 she underwent angiography by myself on 02/06/2018 really 90% distal right common femoral artery stenosis, total occlusion in the distal right SFA with one-vessel runoff via peroneal.  I performed directional atherectomy followed by drug-eluting balloon angioplasty of the distal SFA occlusion and chocolate balloon angioplasty of the distal right common femoral artery.  She had follow-up Doppler studies performed in our office 08/25/2017 revealed marked improvement with a right ABI of 0.82.  Her claudication has  markedly improved.   When I last angiogram to her in 2019 her left SFA was widely patent and she had one-vessel runoff via peroneal artery.  Her most recent Doppler studies performed 08/26/2023 revealed a right ABI of 0.86 with a patent right SFA and a left ABI of 0.63 with an occluded left SFA which explains her progressive symptoms   I performed peripheral angiography on her 12/11/23 revealing a 60% left common iliac artery stenosis with a 10 mm pullback gradient, left SFA CTO with one-vessel runoff via peroneal artery.  I performed shockwave angioplasty followed by [facility] with excellent result.  For follow-up Dopplers post procedure 01/03/2024 showed no increase in left ABI although her velocities in her mid left SFA had markedly improved as did her claudication.  Since I saw her 3 months ago she has had recurrent claudication.  She did enjoy improvement in her claudication post intervention 9 months ago but then developed recurrent symptoms.  Her Doppler studies performed 07/11/2024 revealed an occluded left SFA.  She denies chest pain  or shortness of breath.   Current Meds  Medication Sig   albuterol  (VENTOLIN  HFA) 108 (90 Base) MCG/ACT inhaler Inhale 1-2 puffs into the lungs every 6 (six) hours as needed for wheezing or shortness of breath.    amLODipine  (NORVASC ) 10 MG tablet TAKE ONE TABLET BY MOUTH ONCE DAILY   aspirin  EC 81 MG tablet Take 81 mg by mouth in the morning.   clopidogrel  (PLAVIX ) 75 MG tablet Take 1 tablet (75 mg total) by mouth daily with breakfast.   cyclobenzaprine  (FLEXERIL ) 10 MG tablet Take 10 mg by mouth at bedtime as needed for muscle spasms.   diclofenac  Sodium (VOLTAREN ) 1 % GEL Apply 2 g topically 4 (four) times daily as needed (pain.).   doxycycline (VIBRA-TABS) 100 MG tablet Take 100 mg by mouth 2 (two) times daily.   ezetimibe  (ZETIA ) 10 MG tablet Take 10 mg by mouth daily.   fluticasone  (FLONASE ) 50 MCG/ACT nasal spray Place 1 spray into both nostrils daily as needed for allergies.   Lancets (ONETOUCH DELICA PLUS LANCET33G) MISC 1 each by Other route 2 (two) times daily.   levothyroxine  (SYNTHROID ) 88 MCG tablet Take 88 mcg by mouth daily before breakfast. (Patient taking differently: Take 88 mcg by mouth every other day.)   lisinopril  (ZESTRIL ) 5 MG tablet Take 5 mg by mouth daily.   Multiple Vitamin (MULTIVITAMIN WITH MINERALS) TABS tablet Take 1 tablet by mouth 2 (two) times a week. Centrum   ONETOUCH VERIO test strip 1 each by Other route 2 (two) times daily.   pantoprazole  (PROTONIX ) 40 MG tablet Take 1 tablet (40 mg total) by mouth daily as needed (indigestion/heartburn.).   polyethylene glycol (MIRALAX ) 17 g packet Take 17 g by mouth daily.   Propylene Glycol 0.6 % SOLN Place 1 drop into both eyes every 4 (four) hours as needed (dry eyes).   Vitamin D , Ergocalciferol , (DRISDOL ) 1.25 MG (50000 UNIT) CAPS capsule Take 50,000 Units by mouth once a week.     Allergies  Allergen Reactions   Atorvastatin  Itching and Other (See Comments)    Muscle aches    Social History   Socioeconomic History   Marital status: Divorced    Spouse name: Not on file   Number of children: 2   Years of education: 14   Highest education level: Not on file  Occupational History   Occupation: Retired    Associate Professor:  RETIRED   Occupation: retired  Tobacco Use   Smoking status: Former    Current packs/day: 0.00    Average packs/day: 0.8 packs/day for 45.0 years (36.0 ttl pk-yrs)    Types: Cigarettes    Start date: 08/23/1964    Quit date: 08/23/2009    Years since quitting: 14.9   Smokeless tobacco: Never  Vaping Use   Vaping status: Never Used  Substance and Sexual Activity   Alcohol  use: Not Currently    Alcohol /week: 2.0 standard drinks of alcohol     Types: 2 Glasses of wine per week   Drug use: Never   Sexual activity: Not Currently  Other Topics Concern   Not on file  Social History Narrative   Not on file   Social Drivers of Health   Financial Resource Strain: Not on file  Food Insecurity: Not on file  Transportation Needs: Not on file  Physical Activity: Not on file  Stress: Not on file  Social Connections: Not on file  Intimate Partner Violence: Not on file     Review of Systems: General:  negative for chills, fever, night sweats or weight changes.  Cardiovascular: negative for chest pain, dyspnea on exertion, edema, orthopnea, palpitations, paroxysmal nocturnal dyspnea or shortness of breath Dermatological: negative for rash Respiratory: negative for cough or wheezing Urologic: negative for hematuria Abdominal: negative for nausea, vomiting, diarrhea, bright red blood per rectum, melena, or hematemesis Neurologic: negative for visual changes, syncope, or dizziness All other systems reviewed and are otherwise negative except as noted above.    Blood pressure 112/72, pulse 84, height 5' 3 (1.6 m), weight 124 lb 9.6 oz (56.5 kg), SpO2 97%.  General appearance: alert and no distress Neck: no adenopathy, no JVD, supple, symmetrical, trachea midline, thyroid  not enlarged, symmetric, no tenderness/mass/nodules, and left carotid Melessia Kaus Lungs: clear to auscultation bilaterally Heart: regular rate and rhythm, S1, S2 normal, no murmur, click, rub or gallop Extremities: extremities  normal, atraumatic, no cyanosis or edema Pulses: Diminished pedal pulses Skin: Skin color, texture, turgor normal. No rashes or lesions Neurologic: Grossly normal  EKG not performed today      ASSESSMENT AND PLAN:   Carotid artery disease History of moderate left ICA stenosis by duplex ultrasound 09/15/2023.  She does have a left carotid bruit.  Will repeat her carotid Dopplers this coming January.  Claudication in peripheral vascular disease History of PAD status post multiple interventions by myself beginning 02/2014 with directional arthrectomy and DCB of an occluded left SFA.  She is recent was restudied by myself 02/06/2018 revealing a total occlusion of the distal right SFA status post atherectomy and DCB and shockwave balloon angioplasty of the distal right common femoral artery.  I angiogram her in 2019 and again most recently 12/11/2023.  She did have a 60% left common iliac artery stenosis with a 10 mm gradient and a mid left SFA CTO with one-vessel runoff via the peroneal artery.  I performed shockwave angioplasty followed by [facility] with excellent result.  She did enjoy improvement in her claudication symptoms for several months but then developed recurrent symptoms.  Her most recent Doppler studies unfortunately performed 07/11/2024 showed reocclusion of her left SFA.  Her iliac arteries appeared widely patent.  Her right SFA was patent as well.  She is not anxious to undergo another procedure.  I am going to begin her on Pletal  50 mg p.o. twice daily and we will see her back in 3 months and reassess at that time.  Should she require reintervention I would consider drug-eluting stenting.     Dorn DOROTHA Lesches MD FACP,FACC,FAHA, [facility] 07/24/2024 9:08 AM

## 2024-07-24 NOTE — Patient Instructions (Signed)
 Medication Instructions:  Your physician has recommended you make the following change in your medication:   -Start Cilostazol  (Pletal ) 50mg  twice daily.  *If you need a refill on your cardiac medications before your next appointment, please call your pharmacy*   Follow-Up: At [facility], you and your health needs are our priority.  As part of our continuing mission to provide you with exceptional heart care, our providers are all part of one team.  This team includes your primary Cardiologist (physician) and Advanced Practice Providers or APPs (Physician Assistants and Nurse Practitioners) who all work together to provide you with the care you need, when you need it.  Your next appointment:   3 month(s)  Provider:   Dorn Lesches, MD    We recommend signing up for the patient portal called MyChart.  Sign up information is provided on this After Visit Summary.  MyChart is used to connect with patients for Virtual Visits (Telemedicine).  Patients are able to view lab/test results, encounter notes, upcoming appointments, etc.  Non-urgent messages can be sent to your provider as well.   To learn more about what you can do with MyChart, go to [email or web address].

## 2024-07-24 NOTE — Assessment & Plan Note (Signed)
 History of PAD status post multiple interventions by myself beginning 02/2014 with directional arthrectomy and DCB of an occluded left SFA.  She is recent was restudied by myself 02/06/2018 revealing a total occlusion of the distal right SFA status post atherectomy and DCB and shockwave balloon angioplasty of the distal right common femoral artery.  I angiogram her in 2019 and again most recently 12/11/2023.  She did have a 60% left common iliac artery stenosis with a 10 mm gradient and a mid left SFA CTO with one-vessel runoff via the peroneal artery.  I performed shockwave angioplasty followed by [facility] with excellent result.  She did enjoy improvement in her claudication symptoms for several months but then developed recurrent symptoms.  Her most recent Doppler studies unfortunately performed 07/11/2024 showed reocclusion of her left SFA.  Her iliac arteries appeared widely patent.  Her right SFA was patent as well.  She is not anxious to undergo another procedure.  I am going to begin her on Pletal  50 mg p.o. twice daily and we will see her back in 3 months and reassess at that time.  Should she require reintervention I would consider drug-eluting stenting.

## 2024-07-24 NOTE — Assessment & Plan Note (Signed)
 History of moderate left ICA stenosis by duplex ultrasound 09/15/2023.  She does have a left carotid bruit.  Will repeat her carotid Dopplers this coming January.

## 2024-08-07 ENCOUNTER — Ambulatory Visit: Admitting: Podiatry

## 2024-08-07 ENCOUNTER — Encounter: Payer: Self-pay | Admitting: Podiatry

## 2024-08-07 DIAGNOSIS — B351 Tinea unguium: Secondary | ICD-10-CM | POA: Diagnosis not present

## 2024-08-07 DIAGNOSIS — E1151 Type 2 diabetes mellitus with diabetic peripheral angiopathy without gangrene: Secondary | ICD-10-CM | POA: Diagnosis not present

## 2024-08-07 DIAGNOSIS — M79675 Pain in left toe(s): Secondary | ICD-10-CM | POA: Diagnosis not present

## 2024-08-07 DIAGNOSIS — L84 Corns and callosities: Secondary | ICD-10-CM | POA: Diagnosis not present

## 2024-08-07 DIAGNOSIS — M79674 Pain in right toe(s): Secondary | ICD-10-CM

## 2024-08-12 ENCOUNTER — Encounter: Payer: Self-pay | Admitting: Podiatry

## 2024-08-12 NOTE — Progress Notes (Signed)
"  °  Subjective:  Patient ID: [name], female    DOB: [birth year],  MRN: [id number]  [name] presents to clinic today for at risk foot care. Pt has h/o NIDDM with PAD and corn(s) right foot and painful mycotic toenails that are difficult to trim. Painful toenails interfere with ambulation. Aggravating factors include wearing enclosed shoe gear. Pain is relieved with periodic professional debridement. Painful corns are aggravated when weightbearing when wearing enclosed shoe gear. Pain is relieved with periodic professional debridement.  Chief Complaint  Patient presents with   Diabetes    [facility] Diet control diabetes. Toenail trim. LOV with PCP   New problem(s): None.   PCP is Tackett, Candis CROME, NP.  Allergies[1]  Review of Systems: Negative except as noted in the HPI.  Objective:  There were no vitals filed for this visit. [name] is a pleasant 78 y.o. female WD, WN in NAD. AAO x 3.  Vascular Examination: CFT <4 seconds b/l. Nonpalpable pedal pulses b/l.  Digital hair absent. Skin temperature gradient warm to cool b/l. No ischemia or gangrene. No cyanosis or clubbing noted b/l. No edema noted b/l LE.   Neurological Examination: Sensation grossly intact b/l with 10 gram monofilament. Vibratory sensation intact b/l.   Dermatological Examination: Pedal skin thin, shiny and atrophic b/l. No open wounds. No interdigital macerations.   Toenails 1-5 b/l thick, discolored, elongated with subungual debris and pain on dorsal palpation.   Hyperkeratotic lesion(s) dorsal PIPJ right 4th toe.  No erythema, no edema, no drainage, no fluctuance.  Musculoskeletal Examination: Muscle strength 5/5 to all lower extremity muscle groups bilaterally. No pain, crepitus or joint limitation noted with ROM b/l LE. HAV with bunion b/l. Hammertoe(s) 2-5 b/l.[name] Patient ambulates with cane assistance. She has pain noted lateral anterolateral subtalar joint LLE.  Radiographs:  None  Assessment/Plan: 1. Pain due to onychomycosis of toenails of both feet   2. Corns   3. Type II diabetes mellitus with peripheral circulatory disorder [facility])   Patient was evaluated and treated. All patient's and/or POA's questions/concerns addressed on today's visit. Toenails 1-5 b/l debrided in length and girth without incident. Corn(s) dorsal PIPJ right 4th toe pared with sharp debridement without incident. Continue foot and shoe inspections daily. Monitor blood glucose per PCP/Endocrinologist's recommendations. Continue soft, supportive shoe gear daily. Report any pedal injuries to medical professional. Call office if there are any questions/concerns. -Patient/POA to call should there be question/concern in the interim.   No follow-ups on file.  [name] Merlin, DPM      [place] LOCATION: [address], [place] [address]                   Office [phone]   [facility] LOCATION: [address] [place], [place] [address] Office [phone]      [1]  Allergies Allergen Reactions   Atorvastatin  Itching and Other (See Comments)    Muscle aches   "

## 2024-09-04 ENCOUNTER — Telehealth: Payer: Self-pay | Admitting: Cardiovascular Disease

## 2024-09-04 DIAGNOSIS — I779 Disorder of arteries and arterioles, unspecified: Secondary | ICD-10-CM

## 2024-09-04 DIAGNOSIS — I6522 Occlusion and stenosis of left carotid artery: Secondary | ICD-10-CM

## 2024-09-04 NOTE — Telephone Encounter (Signed)
 Patient would like to schedule her carotid test in February however order expires 1/23. Need a new order on file Please Advise

## 2024-09-04 NOTE — Telephone Encounter (Signed)
 Current Carotid duplex order will expire 09/14/24 and patient cannot get it completed until February. Had to place new order so patient can schedule test to be completed in February.

## 2024-09-06 ENCOUNTER — Encounter (HOSPITAL_COMMUNITY): Payer: Self-pay

## 2024-09-10 ENCOUNTER — Ambulatory Visit (HOSPITAL_COMMUNITY): Payer: Medicare (Managed Care)

## 2024-09-27 ENCOUNTER — Ambulatory Visit: Payer: Self-pay | Admitting: Cardiovascular Disease

## 2024-09-27 ENCOUNTER — Ambulatory Visit (HOSPITAL_COMMUNITY)
Admission: RE | Admit: 2024-09-27 | Discharge: 2024-09-27 | Disposition: A | Payer: Medicare (Managed Care) | Source: Ambulatory Visit | Attending: Cardiovascular Disease | Admitting: Cardiovascular Disease

## 2024-09-27 DIAGNOSIS — I779 Disorder of arteries and arterioles, unspecified: Secondary | ICD-10-CM

## 2024-09-27 DIAGNOSIS — I6522 Occlusion and stenosis of left carotid artery: Secondary | ICD-10-CM

## 2024-10-22 ENCOUNTER — Ambulatory Visit: Payer: Self-pay | Admitting: Cardiovascular Disease

## 2024-11-20 ENCOUNTER — Ambulatory Visit: Admitting: Podiatry
